# Patient Record
Sex: Female | Born: 1940 | Race: White | Hispanic: No | Smoking: Former smoker
Health system: Southern US, Community
[De-identification: ages and names within clinical notes are randomized; demographics above are authoritative.]

## PROBLEM LIST (undated history)

## (undated) DIAGNOSIS — A0472 Enterocolitis due to Clostridium difficile, not specified as recurrent: Secondary | ICD-10-CM

## (undated) DIAGNOSIS — E871 Hypo-osmolality and hyponatremia: Secondary | ICD-10-CM

## (undated) DIAGNOSIS — R1310 Dysphagia, unspecified: Secondary | ICD-10-CM

## (undated) DIAGNOSIS — E119 Type 2 diabetes mellitus without complications: Secondary | ICD-10-CM

## (undated) DIAGNOSIS — I10 Essential (primary) hypertension: Secondary | ICD-10-CM

## (undated) DIAGNOSIS — R160 Hepatomegaly, not elsewhere classified: Secondary | ICD-10-CM

## (undated) DIAGNOSIS — I509 Heart failure, unspecified: Secondary | ICD-10-CM

## (undated) DIAGNOSIS — D509 Iron deficiency anemia, unspecified: Secondary | ICD-10-CM

## (undated) DIAGNOSIS — G4730 Sleep apnea, unspecified: Secondary | ICD-10-CM

## (undated) DIAGNOSIS — M6281 Muscle weakness (generalized): Secondary | ICD-10-CM

## (undated) DIAGNOSIS — J40 Bronchitis, not specified as acute or chronic: Secondary | ICD-10-CM

## (undated) DIAGNOSIS — Z9181 History of falling: Secondary | ICD-10-CM

## (undated) DIAGNOSIS — N183 Chronic kidney disease, stage 3 unspecified: Secondary | ICD-10-CM

## (undated) DIAGNOSIS — I5030 Unspecified diastolic (congestive) heart failure: Secondary | ICD-10-CM

## (undated) DIAGNOSIS — K2270 Barrett's esophagus without dysplasia: Secondary | ICD-10-CM

## (undated) DIAGNOSIS — E039 Hypothyroidism, unspecified: Secondary | ICD-10-CM

## (undated) DIAGNOSIS — K2970 Gastritis, unspecified, without bleeding: Secondary | ICD-10-CM

## (undated) DIAGNOSIS — J449 Chronic obstructive pulmonary disease, unspecified: Secondary | ICD-10-CM

## (undated) DIAGNOSIS — I480 Paroxysmal atrial fibrillation: Secondary | ICD-10-CM

## (undated) DIAGNOSIS — R262 Difficulty in walking, not elsewhere classified: Secondary | ICD-10-CM

## (undated) DIAGNOSIS — M109 Gout, unspecified: Secondary | ICD-10-CM

## (undated) DIAGNOSIS — K219 Gastro-esophageal reflux disease without esophagitis: Secondary | ICD-10-CM

## (undated) DIAGNOSIS — L0390 Cellulitis, unspecified: Secondary | ICD-10-CM

## (undated) HISTORY — DX: Chronic obstructive pulmonary disease, unspecified: J44.9

## (undated) HISTORY — DX: Barrett's esophagus without dysplasia: K22.70

## (undated) HISTORY — DX: Gastro-esophageal reflux disease without esophagitis: K21.9

## (undated) HISTORY — PX: COLONOSCOPY: SHX174

## (undated) HISTORY — PX: UPPER GASTROINTESTINAL ENDOSCOPY: SHX188

## (undated) HISTORY — DX: Heart failure, unspecified: I50.9

## (undated) HISTORY — DX: Bronchitis, not specified as acute or chronic: J40

## (undated) HISTORY — PX: CHOLECYSTECTOMY: SHX55

---

## 2005-08-02 ENCOUNTER — Ambulatory Visit (HOSPITAL_COMMUNITY): Admission: RE | Admit: 2005-08-02 | Discharge: 2005-08-02 | Payer: Self-pay | Admitting: Family Medicine

## 2005-08-23 ENCOUNTER — Ambulatory Visit: Payer: Self-pay | Admitting: Internal Medicine

## 2005-10-04 ENCOUNTER — Ambulatory Visit: Payer: Self-pay | Admitting: Internal Medicine

## 2005-11-01 ENCOUNTER — Ambulatory Visit: Payer: Self-pay | Admitting: Internal Medicine

## 2005-12-12 ENCOUNTER — Ambulatory Visit (HOSPITAL_COMMUNITY): Admission: RE | Admit: 2005-12-12 | Discharge: 2005-12-12 | Payer: Self-pay | Admitting: General Surgery

## 2008-01-08 DIAGNOSIS — J449 Chronic obstructive pulmonary disease, unspecified: Secondary | ICD-10-CM

## 2008-01-09 ENCOUNTER — Ambulatory Visit: Payer: Self-pay | Admitting: Internal Medicine

## 2008-01-14 ENCOUNTER — Telehealth (INDEPENDENT_AMBULATORY_CARE_PROVIDER_SITE_OTHER): Payer: Self-pay | Admitting: *Deleted

## 2010-10-12 ENCOUNTER — Ambulatory Visit (HOSPITAL_COMMUNITY)
Admission: RE | Admit: 2010-10-12 | Discharge: 2010-10-12 | Payer: Self-pay | Source: Home / Self Care | Attending: Internal Medicine | Admitting: Internal Medicine

## 2010-10-12 ENCOUNTER — Ambulatory Visit: Admit: 2010-10-12 | Payer: Self-pay | Admitting: Internal Medicine

## 2010-10-27 NOTE — Op Note (Signed)
  NAMEMAYTHE, Yolanda [name]                ACCOUNT NO.:  [id number]  MEDICAL RECORD NO.:  [id number]          PATIENT TYPE:  AMB  LOCATION:  DAY                           FACILITY:  [facility]  PHYSICIAN:  Lionel December, M.D.    DATE OF BIRTH:  05/10/1941  DATE OF PROCEDURE:  10/12/2010 DATE OF DISCHARGE:                              OPERATIVE REPORT   PROCEDURE:  Esophagogastroduodenoscopy.  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  Her biopsies revealed Barrett esophagus with inflammation, indeterminate dysplasia.  She is therefore undergoing repeat exam.  She is currently on omeprazole 20 mg daily long with antireflux measures and has not experienced any heartburn or dysphagia.  Procedure risks were reviewed with the patient.  Informed consent was obtained.  MEDICATIONS FOR CONSCIOUS SEDATION:  Cetacaine spray for pharyngeal topical anesthesia, Demerol 50 mg IV, Versed 5 mg IV in divided dose.  FINDINGS:  Procedure performed in endoscopy suite.  The patient's vital signs and O2 sat were monitored during the procedure and remained stable.  The patient was placed in left lateral recumbent position and Pentax videoscope was passed through oropharynx without any difficulty into esophagus.  Esophagus:  Mucosa of the esophagus normal except distally where there was a small patch of salmon-colored mucosa and somewhat larger patch across the mid.  On her last exam, she had small sliding hiatal hernia but this was not obvious today.  GE junction was located at 40 cm from the incisors.  Stomach:  It was emptied and distended very well with insufflation. Folds of proximal stomach are normal.  Examination of mucosa reveals no abnormality at body and antrum.  Somewhat redundant fold in the prepyloric region.  However, no erosions or ulcers were  noted. Angularis, fundus, and cardia were examined by retroflexing the scope and were normal.  Duodenum and bulbar mucosa was normal.  Scope was passed into second part of duodenum where mucosa and folds were normal.  Endoscope was withdrawn.  On the way out biopsies were taken from these 2 patches of salmon- colored mucosa and submitted to 1 container.  The patient tolerated the procedure well. ASSESSMENT: Two patches of salmon-colored mucosa felt to be a short segment Barrett. Multiple biopsies taken. No evidence of peptic ulcer disease.  RECOMMENDATIONS:  She will continue antireflux measures and PPIs as before.  I will be contacting the patient with results of biopsy and further recommendations.     Lionel December, M.D.     NR/MEDQ  D:  10/12/2010  T:  10/12/2010  Job:  [id number]  cc:   Dr. Sherryll Burger  Electronically Signed by Lionel December M.D. on 10/27/2010 01:07:09 PM

## 2011-02-09 NOTE — H&P (Signed)
NAME:  Yolanda [name], Yolanda [name]                ACCOUNT NO.:  [id number]   MEDICAL RECORD NO.:  [id number]          PATIENT TYPE:  AMB   LOCATION:                                FACILITY:  [facility]   PHYSICIAN:  Dalia Heading, M.D.  DATE OF BIRTH:  1940/11/09   DATE OF ADMISSION:  DATE OF DISCHARGE:  LH                                HISTORY & PHYSICAL   CHIEF COMPLAINT:  History of colon polyps.   HISTORY OF PRESENT ILLNESS:  The patient is a 70 year old white female who  is referred for endoscopic evaluation. She needs a colonoscopy for a history  of colon polyps. She had two polyps removed four years ago by another  physician in [place]. No abdominal pain, weight loss, nausea, vomiting,  diarrhea, constipation, or melena have been noted. She has been trace  Hemoccult positive on stool examination. There is no family history of colon  carcinoma.   PAST MEDICAL HISTORY:  1.  Hypothyroidism.  2.  COPD.  3.  Hypertension.   PAST SURGICAL HISTORY:  Cholecystectomy.   CURRENT MEDICATIONS:  Synthroid, Vytorin, Diovan/hydrochlorothiazide,  Advair, albuterol, Niferex.   ALLERGIES:  No known drug allergies.   REVIEW OF SYSTEMS:  Noncontributory.   PHYSICAL EXAMINATION:  GENERAL:  The patient is a well-developed, well-  nourished, white female in no acute distress.  LUNGS:  Clear to auscultation with equal breath sounds bilaterally.  HEART:  Reveals regular rate and rhythm without S3, S4, or murmurs.  ABDOMEN:  Soft, nontender, nondistended. No hepatosplenomegaly or masses are  noted.  RECTAL:  Deferred to the procedure.   IMPRESSION:  Colon polyps.   PLAN:  The patient is scheduled for colonoscopy on December 12, 2005. The risks  and benefits of the procedure including bleeding and perforation were fully  explained to the patient, who gave informed consent.      Dalia Heading, M.D.  Electronically Signed     MAJ/MEDQ  D:  12/04/2005  T:  12/04/2005  Job:  [id number]   cc:   [facility]  Fax: [phone]   Kirk Ruths, M.D.  Fax: [phone]

## 2011-05-26 HISTORY — PX: BREAST BIOPSY: SHX20

## 2011-10-19 ENCOUNTER — Encounter (INDEPENDENT_AMBULATORY_CARE_PROVIDER_SITE_OTHER): Payer: Self-pay | Admitting: *Deleted

## 2011-11-13 ENCOUNTER — Ambulatory Visit (INDEPENDENT_AMBULATORY_CARE_PROVIDER_SITE_OTHER): Payer: MEDICARE | Admitting: Internal Medicine

## 2011-11-13 ENCOUNTER — Encounter (INDEPENDENT_AMBULATORY_CARE_PROVIDER_SITE_OTHER): Payer: Self-pay | Admitting: Internal Medicine

## 2011-11-13 DIAGNOSIS — K2270 Barrett's esophagus without dysplasia: Secondary | ICD-10-CM

## 2011-11-13 DIAGNOSIS — K219 Gastro-esophageal reflux disease without esophagitis: Secondary | ICD-10-CM

## 2011-11-13 MED ORDER — OMEPRAZOLE 20 MG PO CPDR: 20.0000 mg | DELAYED_RELEASE_CAPSULE | Freq: Every day | ORAL | Status: DC

## 2011-11-13 NOTE — Patient Instructions (Signed)
New prescription for omeprazole sent to your pharmacy. Call if symptoms relapse

## 2011-11-13 NOTE — Progress Notes (Signed)
Presenting complaint Chronic GERD. Patient has Barrett's. Subjective: Yolanda [name] is 71 year old C she's been maintained on PPI. She underwent EGD in January 2012 revealing [name] who is here for scheduled visit. She has history of short segment Barrett's esophagus diagnosed in December 2010 when she presented with upper GI bleed and iron deficiency anemia. Biopsy was indeterminate for dysplasia. She was maintained on PPI. She underwent EGD with biopsy in January 2012 revealing Barrett's without dysplasia. She states she is doing well she denies heartburn dysphagia nausea vomiting or throat symptoms. She has a lot of energy. Bowels move regularly she denies melena or rectal bleeding. Her appetite is very good and she has gained 5 pounds since her last visit. Current Medications: Current Outpatient Prescriptions  Medication Sig Dispense Refill  . acetaminophen (TYLENOL) 500 MG tablet Take 500 mg by mouth as needed.      Marland Kitchen aspirin 81 MG tablet Take 81 mg by mouth as needed.      . cyclobenzaprine (FLEXERIL) 10 MG tablet Take 10 mg by mouth as needed.      . docusate sodium (COLACE) 100 MG capsule Take 100 mg by mouth as needed.      . fexofenadine (ALLEGRA) 180 MG tablet Take 180 mg by mouth daily.      . fluticasone (FLONASE) 50 MCG/ACT nasal spray Place 2 sprays into the nose as needed.      . Fluticasone-Salmeterol (ADVAIR) 250-50 MCG/DOSE AEPB Inhale 1 puff into the lungs every 12 (twelve) hours.      Marland Kitchen HYDROCHLOROTHIAZIDE PO Take 25 mg by mouth daily. Patient takes 1/2 tablet daily      . ibuprofen (ADVIL,MOTRIN) 200 MG tablet Take 200 mg by mouth as needed.      Marland Kitchen levothyroxine (SYNTHROID, LEVOTHROID) 100 MCG tablet Take 100 mcg by mouth daily.      Marland Kitchen lisinopril (PRINIVIL,ZESTRIL) 20 MG tablet Take 20 mg by mouth daily.      Marland Kitchen lovastatin (MEVACOR) 10 MG tablet Take 10 mg by mouth at bedtime.      . meclizine (ANTIVERT) 25 MG tablet Take 25 mg by mouth as needed.      Marland Kitchen omeprazole  (PRILOSEC) 20 MG capsule Take 20 mg by mouth daily.        Objective: Blood pressure 126/70, pulse 74, temperature 98.5 F (36.9 C), temperature source Oral, resp. rate 14, height 5\' 1"  (1.549 m), weight 215 lb 1.6 oz (97.569 kg).  Conjunctiva is pink. Sclera is nonicteric Oropharyngeal mucosa is normal. No neck masses or thyromegaly noted. Abdomen is full but soft and nontender without organomegaly or masses. No LE edema or clubbing noted.  Assessment: Chronic GERD complicated by short segment Barrett's esophagus. Biopsy from January 2012 is negative for dysplasia. Her symptoms are well-controlled with therapy.   Plan: She'll continue anti-reflux measures and omeprazole as before. New prescription sent to her pharmacy. Office visit in one year.

## 2012-11-17 ENCOUNTER — Encounter (INDEPENDENT_AMBULATORY_CARE_PROVIDER_SITE_OTHER): Payer: Self-pay | Admitting: *Deleted

## 2012-11-25 ENCOUNTER — Other Ambulatory Visit (INDEPENDENT_AMBULATORY_CARE_PROVIDER_SITE_OTHER): Payer: Self-pay | Admitting: Internal Medicine

## 2012-12-25 ENCOUNTER — Other Ambulatory Visit: Payer: Self-pay | Admitting: Family Medicine

## 2012-12-26 NOTE — Telephone Encounter (Signed)
This pt. Has not been since 10/05/11

## 2013-01-12 ENCOUNTER — Ambulatory Visit (INDEPENDENT_AMBULATORY_CARE_PROVIDER_SITE_OTHER): Payer: MEDICARE | Admitting: Internal Medicine

## 2013-01-15 ENCOUNTER — Ambulatory Visit (INDEPENDENT_AMBULATORY_CARE_PROVIDER_SITE_OTHER): Payer: MEDICARE | Admitting: Internal Medicine

## 2013-01-15 ENCOUNTER — Ambulatory Visit (INDEPENDENT_AMBULATORY_CARE_PROVIDER_SITE_OTHER): Payer: Medicare Other | Admitting: Internal Medicine

## 2013-01-15 ENCOUNTER — Encounter (INDEPENDENT_AMBULATORY_CARE_PROVIDER_SITE_OTHER): Payer: Self-pay | Admitting: Internal Medicine

## 2013-01-15 VITALS — BP 184/62 | HR 88 | Ht 61.5 in | Wt 221.8 lb

## 2013-01-15 DIAGNOSIS — K2270 Barrett's esophagus without dysplasia: Secondary | ICD-10-CM

## 2013-01-15 NOTE — Progress Notes (Addendum)
Subjective:     Patient ID: Yolanda [name], female   DOB: March 22, 1941, 72 y.o.   MRN: [id number]  HPI Here today for f/u of her Barrett's esophagus. She tells me she is doing great. There is no acid reflux. Controlled with Omeprazole. Appetite is good. No weight loss. No abdominal pain. BMs are normal. She does take a stool softner for her stools.   10/12/10 EGD: INDICATIONS: Yolanda [name] is a 72 year old Caucasian female who was found to  have short segment Barrett esophagus in December 2010 when she presented  with GI bleed and iron deficiency anemia. Her biopsies revealed Barrett  esophagus with inflammation, indeterminate dysplasia. ASSESSMENT:  Two patches of salmon-colored mucosa felt to be a short segment Barrett.  Multiple biopsies taken.  No evidence of peptic ulcer disease.  Biopsy:  1. Esophagus, biopsy, : - INTESTINAL METAPLASIA WITH GOBLET CELLS (BARRETT'S ESOPHAGUS) - NEGATIVE FOR DYSPLASIA - SQUAMOCOLUMNAR MUCOSA WITH SQUAMOUS EPITHELIAL CHANGES CONSISTENT WITH REFLUX RELATED INJURY - NEGATIVE FOR MORPHOLOGICAL FEATURES OF EOSINOPHILIC ESOPHAGITIS - REACTIVE AND INFLAMED COLUMNAR/GLANDULAR MUCOSA. Yolanda [name] STAIN NEGATIVE FOR H. PYLORI    Review of Systems se hpi Current Outpatient Prescriptions  Medication Sig Dispense Refill  . acetaminophen (TYLENOL) 500 MG tablet Take 500 mg by mouth as needed.      . cyclobenzaprine (FLEXERIL) 10 MG tablet Take 10 mg by mouth as needed.      . docusate sodium (COLACE) 100 MG capsule Take 100 mg by mouth as needed.      . ergocalciferol (VITAMIN D2) 50000 UNITS capsule Take 50,000 Units by mouth. Twice a week      . ezetimibe-simvastatin (VYTORIN) 10-10 MG per tablet Take 1 tablet by mouth at bedtime.      . fexofenadine (ALLEGRA) 180 MG tablet Take 180 mg by mouth daily.      . fluticasone (FLONASE) 50 MCG/ACT nasal spray Place 2 sprays into the nose as needed.      Marland Kitchen ibuprofen (ADVIL,MOTRIN) 200 MG tablet Take 200 mg by mouth  as needed.      Marland Kitchen levothyroxine (SYNTHROID, LEVOTHROID) 100 MCG tablet Take 100 mcg by mouth daily.      Marland Kitchen lisinopril (PRINIVIL,ZESTRIL) 20 MG tablet Take 20 mg by mouth daily.      Marland Kitchen omeprazole (PRILOSEC) 20 MG capsule TAKE ONE CAPSULE ONCE DAILY  30 capsule  5  . topiramate (TOPAMAX) 25 MG capsule Take 25 mg by mouth 2 (two) times daily.       No current facility-administered medications for this visit.   Past Medical History  Diagnosis Date  . Thyroid condition   . Hypertension   . GERD (gastroesophageal reflux disease)   . COPD (chronic obstructive pulmonary disease)   . Bronchitis   . Barrett esophagus    Past Surgical History  Procedure Laterality Date  . Cholecystectomy    . Colonoscopy    . Upper gastrointestinal endoscopy    . Right breast biopsy  Sept, 2012   Allergies  Allergen Reactions  . Fish Oil     Patient face drew to the side,Bells Palsey  . Ciprofloxacin     REACTION: sob,tachycardia  . Guaifenesin     REACTION: sob,tachycardia         Objective:   Physical Exam  Filed Vitals:   01/15/13 1507  BP: 184/62  Pulse: 88  Height: 5' 1.5" (1.562 m)  Weight: 221 lb 12.8 oz (100.608 kg)  Alert and oriented. Skin warm and dry. Oral mucosa  is moist.   . Sclera anicteric, conjunctivae is pink. Thyroid not enlarged. No cervical lymphadenopathy. Lungs clear. Heart regular rate and rhythm.  Abdomen is soft. Bowel sounds are positive. No hepatomegaly. No abdominal masses felt. No tenderness.  No edema to lower extremities.        Assessment:     Barrett's esophagus. Acid reflux controlled at this time.     Plan:    OV in 1 yr.Next EGD in 3 yrs.

## 2013-01-15 NOTE — Patient Instructions (Addendum)
OV in 1 yr. 

## 2013-01-29 ENCOUNTER — Encounter (INDEPENDENT_AMBULATORY_CARE_PROVIDER_SITE_OTHER): Payer: Self-pay

## 2013-05-27 ENCOUNTER — Other Ambulatory Visit (INDEPENDENT_AMBULATORY_CARE_PROVIDER_SITE_OTHER): Payer: Self-pay | Admitting: Internal Medicine

## 2013-07-23 ENCOUNTER — Ambulatory Visit (INDEPENDENT_AMBULATORY_CARE_PROVIDER_SITE_OTHER): Payer: Medicare Other | Admitting: Internal Medicine

## 2013-07-23 ENCOUNTER — Encounter: Payer: Self-pay | Admitting: Internal Medicine

## 2013-07-23 VITALS — BP 150/74 | HR 99 | Temp 98.3°F | Ht 61.0 in | Wt 231.8 lb

## 2013-07-23 DIAGNOSIS — Z23 Encounter for immunization: Secondary | ICD-10-CM

## 2013-07-23 DIAGNOSIS — J449 Chronic obstructive pulmonary disease, unspecified: Secondary | ICD-10-CM

## 2013-07-23 DIAGNOSIS — J4489 Other specified chronic obstructive pulmonary disease: Secondary | ICD-10-CM

## 2013-07-23 MED ORDER — UMECLIDINIUM-VILANTEROL 62.5-25 MCG/INH IN AEPB: 2.0000 | INHALATION_SPRAY | Freq: Once | RESPIRATORY_TRACT | Status: DC

## 2013-07-23 NOTE — Progress Notes (Signed)
  Subjective:    Patient ID: Yolanda [name], female    DOB: [birth year] MRN: [id number]  HPI  52 yowf quit smoking 2001 at around 170 lb with pattern of recurrent bronchitis then that improved some and much better  p advair and ever better p stopped working at  [employer] 2011 then started gradual doe 2012 around wt 190 self referred to Pulmonary clinic 07/23/2013 for doe at wt 231   07/23/2013 1st [facility] office visit in EMR era/ Krisy Dix cc 2 y hx indolent onset doe progressive worse doe hills, fast pace on flat surface but back also limiting. Better walking p albuterol (proaire)   No change on symbicort previously, no tendency to aecopd. Doe parallels gradual wt gain. avg use of albuterol maybe once qod while maint on advair 250 bid with mild hoarseness noted. No better on breo   No obvious day to day or daytime variabilty or assoc chronic cough or cp or chest tightness, subjective wheeze overt sinus or hb symptoms. No unusual exp hx or h/o childhood pna/ asthma or knowledge of premature birth.  Sleeping ok without nocturnal  or early am exacerbation  of respiratory  c/o's or need for noct saba. Also denies any obvious fluctuation of symptoms with weather or environmental changes or other aggravating or alleviating factors except as outlined above - Breo no better than advair  Current Medications, Allergies, Complete Past Medical History, Past Surgical History, Family History, and Social History were reviewed in [employer] record.          Review of Systems  Constitutional: Negative for fever, chills and unexpected weight change.  HENT: Positive for voice change. Negative for congestion, dental problem, ear pain, nosebleeds, postnasal drip, rhinorrhea, sinus pressure, sneezing, sore throat and trouble swallowing.   Eyes: Negative for visual disturbance.  Respiratory: Positive for shortness of breath. Negative for cough and choking.   Cardiovascular: Positive for leg  swelling. Negative for chest pain.  Gastrointestinal: Negative for vomiting, abdominal pain and diarrhea.  Genitourinary: Negative for difficulty urinating.  Musculoskeletal: Positive for arthralgias.  Skin: Negative for rash.  Neurological: Negative for tremors, syncope and headaches.  Hematological: Bruises/bleeds easily.       Objective:   Physical Exam  Wt Readings from Last 3 Encounters:  07/23/13 231 lb 12.8 oz (105.144 kg)  01/15/13 221 lb 12.8 oz (100.608 kg)  11/13/11 215 lb 1.6 oz (97.569 kg)     Pleasant amb wf nad  HEENT: nl dentition, turbinates, and orophanx. Nl external ear canals without cough reflex   NECK :  without JVD/Nodes/TM/ nl carotid upstrokes bilaterally   LUNGS: no acc muscle use, clear to A and P bilaterally without cough on insp or exp maneuvers   CV:  RRR  no s3 or murmur or increase in P2, no edema   ABD:  soft and nontender with nl excursion in the supine position. No bruits or organomegaly, bowel sounds nl  MS:  warm without deformities, calf tenderness, cyanosis or clubbing  SKIN: warm and dry without lesions    NEURO:  alert, approp, no deficits    02/10/13 cxr copd mild hyperinflation      Assessment & Plan:

## 2013-07-23 NOTE — Patient Instructions (Addendum)
anoro take two drags each am after opening only once  Please schedule a follow up office visit in 6 weeks, call sooner if needed with pft's on return.  Add needs walking sats also

## 2013-07-24 NOTE — Assessment & Plan Note (Signed)
  When respiratory symptoms begin or become refractory well after a patient reports complete smoking cessation,  Especially when this wasn't the case while they were smoking, a red flag is raised based on the work of Dr Primitivo Gauze which states:  if you quit smoking when your best day FEV1 is still well preserved it is highly unlikely you will progress to severe disease.  That is to say, once the smoking stops,  the symptoms should not suddenly erupt or markedly worsen.  If so, the differential diagnosis should include  obesity/deconditioning,  LPR/Reflux/Aspiration syndromes,  occult CHF, or  especially side effect of medications commonly used in this population.    Obesity / deconditioning appear to be the main issue here. Try anoro samples and return for pft's to compare to baseline from paper chart/ requested  The proper method of use, as well as anticipated side effects, of a metered-dose dry powdered inhaler are discussed and demonstrated to the patient. Improved effectiveness after extensive coaching during this visit to a level of approximately  90%

## 2013-09-01 ENCOUNTER — Other Ambulatory Visit: Payer: Self-pay | Admitting: Internal Medicine

## 2013-09-01 DIAGNOSIS — J449 Chronic obstructive pulmonary disease, unspecified: Secondary | ICD-10-CM

## 2013-09-03 ENCOUNTER — Ambulatory Visit (INDEPENDENT_AMBULATORY_CARE_PROVIDER_SITE_OTHER): Payer: Medicare Other | Admitting: Internal Medicine

## 2013-09-03 ENCOUNTER — Encounter: Payer: Self-pay | Admitting: Internal Medicine

## 2013-09-03 VITALS — BP 144/62 | HR 95 | Temp 98.1°F | Ht 61.0 in | Wt 232.0 lb

## 2013-09-03 DIAGNOSIS — J449 Chronic obstructive pulmonary disease, unspecified: Secondary | ICD-10-CM

## 2013-09-03 DIAGNOSIS — R05 Cough: Secondary | ICD-10-CM

## 2013-09-03 DIAGNOSIS — R058 Other specified cough: Secondary | ICD-10-CM | POA: Insufficient documentation

## 2013-09-03 LAB — PULMONARY FUNCTION TEST
DL/VA % pred: 87 %
DLCO unc: 13.79 ml/min/mmHg
FEF 25-75 Post: 0.87 L/sec
FEF 25-75 Pre: 0.6 L/sec
FEF2575-%Change-Post: 45 %
FEF2575-%Pred-Post: 51 %
FEF2575-%Pred-Pre: 35 %
FEV1-%Change-Post: 9 %
FEV1-%Pred-Post: 53 %
FEV1-%Pred-Pre: 48 %
FEV1-Post: 1.05 L
FEV1-Pre: 0.95 L
FEV1FVC-%Change-Post: 7 %
FEV1FVC-%Pred-Pre: 92 %
FEV6-%Change-Post: 2 %
FEV6-%Pred-Post: 55 %
FEV6-Post: 1.39 L
FEV6FVC-%Pred-Post: 105 %
FEV6FVC-%Pred-Pre: 105 %
FVC-%Pred-Post: 52 %
FVC-%Pred-Pre: 51 %
FVC-Post: 1.39 L
FVC-Pre: 1.36 L
Post FEV1/FVC ratio: 76 %
Post FEV6/FVC ratio: 100 %
Pre FEV1/FVC ratio: 70 %
RV % pred: 96 %
RV: 1.97 L
TLC % pred: 79 %
TLC: 3.66 L

## 2013-09-03 MED ORDER — FAMOTIDINE 20 MG PO TABS: ORAL_TABLET | ORAL | Status: DC

## 2013-09-03 MED ORDER — UMECLIDINIUM-VILANTEROL 62.5-25 MCG/INH IN AEPB: 2.0000 | INHALATION_SPRAY | Freq: Once | RESPIRATORY_TRACT | Status: DC

## 2013-09-03 NOTE — Progress Notes (Signed)
Subjective:    Patient ID: Yolanda [name], female    DOB: 1940-11-27 MRN: [id number]       Brief patient profile:  72 yowf quit smoking 2001 at around 170 lb with pattern of recurrent bronchitis then that improved some and much better  p advair and ever better p stopped working at  [employer] 2011 then started gradual doe 2012 around wt 190 self referred to Pulmonary clinic 07/23/2013 for doe at wt 231 with techically a GOLD II pattern copd documented 09/03/2013    History of Present Illness  07/23/2013 1st  Pulmonary office visit in EMR era/ Yolanda [name] cc 2 y hx indolent onset doe progressive worse doe hills, fast pace on flat surface but back also limiting. Better walking p albuterol (proaire)   No change on symbicort previously, no tendency to aecopd. Doe parallels gradual wt gain. avg use of albuterol maybe once qod while maint on advair 250 bid with mild hoarseness noted. No better on breo rec anoro take two drags each am after opening only once   09/03/2013 f/u ov/Yolanda [name] re: doe/ GOLD II copd   Chief Complaint  Patient presents with  . Follow-up    Pt states her SOB has improved some. She has noticed "rattle" in chest since started taking anoro.    Legs give out before the breathing.  Since anoro not needing much if any albuterol  Throat "congestion" worse in am, no excess or purulent sputum and does not awaken prematurely  No obvious day to day or daytime variabilty or assoc   cp or chest tightness, subjective wheeze overt sinus or hb symptoms. No unusual exp hx or h/o childhood pna/ asthma or knowledge of premature birth.  Sleeping ok without nocturnal  or early am exacerbation  of respiratory  c/o's or need for noct saba. Also denies any obvious fluctuation of symptoms with weather or environmental changes or other aggravating or alleviating factors except as outlined above   Current Medications, Allergies, Complete Past Medical History, Past Surgical History, Family History, and  Social History were reviewed in [employer] record.  ROS  The following are not active complaints unless bolded sore throat, dysphagia, dental problems, itching, sneezing,  nasal congestion or excess/ purulent secretions, ear ache,   fever, chills, sweats, unintended wt loss, pleuritic or exertional cp, hemoptysis,  orthopnea pnd or leg swelling, presyncope, palpitations, heartburn, abdominal pain, anorexia, nausea, vomiting, diarrhea  or change in bowel or urinary habits, change in stools or urine, dysuria,hematuria,  rash, arthralgias, visual complaints, headache, numbness weakness or ataxia or problems with walking or coordination,  change in mood/affect or memory.                       Objective:   Physical Exam  09/03/2013      232  Wt Readings from Last 3 Encounters:  07/23/13 231 lb 12.8 oz (105.144 kg)  01/15/13 221 lb 12.8 oz (100.608 kg)  11/13/11 215 lb 1.6 oz (97.569 kg)     Pleasant amb wf nad  HEENT: nl dentition, turbinates, and orophanx. Nl external ear canals without cough reflex   NECK :  without JVD/Nodes/TM/ nl carotid upstrokes bilaterally   LUNGS: no acc muscle use, clear to A and P bilaterally without cough on insp or exp maneuvers   CV:  RRR  no s3 or murmur or increase in P2, no edema   ABD:  soft and nontender with nl excursion in the supine  position. No bruits or organomegaly, bowel sounds nl  MS:  warm without deformities, calf tenderness, cyanosis or clubbing  SKIN: warm and dry without lesions    NEURO:  alert, approp, no deficits    02/10/13 cxr copd mild hyperinflation      Assessment & Plan:

## 2013-09-03 NOTE — Assessment & Plan Note (Signed)
-   trial of anoro 07/24/2013 > improved 09/03/2013  - 09/03/2013  Walked RA x 2laps @ 185 ft each stopped due to legs gave out, sats still 93% - PFT's 09/03/2013 FEV1  1.05 (53%) p B2 and while on Anoro with ratio FEV1/VC 62% and dlco 68 corrects to 87  Improved on anoro vs advair or symbicort and much less saba use now   rec continue anoro unless starting to have aecopd or upper airway cough worse in which case would need to see her back her to regroup

## 2013-09-03 NOTE — Progress Notes (Signed)
PFT done today. 

## 2013-09-03 NOTE — Assessment & Plan Note (Signed)
Throat congestion with absence of excess mucus worse in am and worse on dpi c/w  Classic Upper airway cough syndrome, so named because it's frequently impossible to sort out how much is  CR/sinusitis with freq throat clearing (which can be related to primary GERD)   vs  causing  secondary (" extra esophageal")  GERD from wide swings in gastric pressure that occur with throat clearing, often  promoting self use of mint and menthol lozenges that reduce the lower esophageal sphincter tone and exacerbate the problem further in a cyclical fashion.   These are the same pts (now being labeled as having "irritable larynx syndrome" by some cough centers) who not infrequently have a history of having failed to tolerate ace inhibitors,  dry powder inhalers or biphosphonates or report having atypical reflux symptoms that don't respond to standard doses of PPI , and are easily confused as having aecopd or asthma flares by even experienced allergists/ pulmonologists.  rec add pepcid 20 mg at bedtime and if not effective add 1st gen h1 also If not tolerating laba/lamb dpi there's a new laba/lama hfa coming out soon from [facility] she could try.  F/u is prn

## 2013-09-03 NOTE — Patient Instructions (Addendum)
Omeprazole Take 30-60 min before first meal of the day and pepcid 20 mg at bedtime to see if your am throat congestion improves  GERD (REFLUX)  is an extremely common cause of respiratory symptoms, many times with no significant heartburn at all.    It can be treated with medication, but also with lifestyle changes including avoidance of late meals, excessive alcohol, smoking cessation, and avoid fatty foods, chocolate, peppermint, colas, red wine, and acidic juices such as orange juice.  NO MINT OR MENTHOL PRODUCTS SO NO COUGH DROPS  USE SUGARLESS CANDY INSTEAD (jolley ranchers or Stover's)  NO OIL BASED VITAMINS - use powdered substitutes.    Continue off lisinopril (ace inhibitors tend to do poorly with powdered inhalers)  Continue anoro each am and brush teeth, tongue gargle with baking soda containing toothpast   Only use your albuterol as a rescue medication to be used if you can't catch your breath by resting or doing a relaxed purse lip breathing pattern.  - The less you use it, the better it will work when you need it. - Ok to use up to 2puffs every 4 hours if you must but call for immediate appointment if use goes up over your usual need - Don't leave home without it !!  (think of it like your spare tire for your car)

## 2013-10-07 ENCOUNTER — Other Ambulatory Visit (HOSPITAL_COMMUNITY): Payer: Self-pay | Admitting: Pulmonary Disease

## 2013-10-07 DIAGNOSIS — M7989 Other specified soft tissue disorders: Secondary | ICD-10-CM

## 2013-10-09 ENCOUNTER — Ambulatory Visit (HOSPITAL_COMMUNITY)
Admission: RE | Admit: 2013-10-09 | Discharge: 2013-10-09 | Disposition: A | Discharge status: Home / Self Care | Payer: Medicare Other | Source: Ambulatory Visit | Attending: Pulmonary Disease | Admitting: Pulmonary Disease

## 2013-10-09 DIAGNOSIS — M7989 Other specified soft tissue disorders: Secondary | ICD-10-CM | POA: Insufficient documentation

## 2013-10-20 ENCOUNTER — Encounter (INDEPENDENT_AMBULATORY_CARE_PROVIDER_SITE_OTHER): Payer: Self-pay | Admitting: *Deleted

## 2013-12-05 ENCOUNTER — Other Ambulatory Visit (INDEPENDENT_AMBULATORY_CARE_PROVIDER_SITE_OTHER): Payer: Self-pay | Admitting: Internal Medicine

## 2013-12-08 ENCOUNTER — Other Ambulatory Visit (INDEPENDENT_AMBULATORY_CARE_PROVIDER_SITE_OTHER): Payer: Self-pay | Admitting: Internal Medicine

## 2013-12-08 DIAGNOSIS — K219 Gastro-esophageal reflux disease without esophagitis: Secondary | ICD-10-CM

## 2013-12-08 MED ORDER — OMEPRAZOLE 40 MG PO CPDR: 40.0000 mg | DELAYED_RELEASE_CAPSULE | Freq: Every day | ORAL | Status: DC

## 2013-12-21 ENCOUNTER — Other Ambulatory Visit (INDEPENDENT_AMBULATORY_CARE_PROVIDER_SITE_OTHER): Payer: Self-pay | Admitting: Internal Medicine

## 2013-12-21 DIAGNOSIS — K219 Gastro-esophageal reflux disease without esophagitis: Secondary | ICD-10-CM

## 2013-12-21 MED ORDER — OMEPRAZOLE 20 MG PO CPDR: 20.0000 mg | DELAYED_RELEASE_CAPSULE | Freq: Every day | ORAL | Status: DC

## 2013-12-30 ENCOUNTER — Ambulatory Visit (HOSPITAL_COMMUNITY)
Admission: RE | Admit: 2013-12-30 | Discharge: 2013-12-30 | Disposition: A | Discharge status: Home / Self Care | Payer: Medicare Other | Source: Ambulatory Visit | Attending: Pulmonary Disease | Admitting: Pulmonary Disease

## 2013-12-30 DIAGNOSIS — Z87891 Personal history of nicotine dependence: Secondary | ICD-10-CM | POA: Insufficient documentation

## 2013-12-30 DIAGNOSIS — R609 Edema, unspecified: Secondary | ICD-10-CM | POA: Insufficient documentation

## 2013-12-30 DIAGNOSIS — I319 Disease of pericardium, unspecified: Secondary | ICD-10-CM | POA: Insufficient documentation

## 2013-12-30 DIAGNOSIS — J4489 Other specified chronic obstructive pulmonary disease: Secondary | ICD-10-CM | POA: Insufficient documentation

## 2013-12-30 DIAGNOSIS — I10 Essential (primary) hypertension: Secondary | ICD-10-CM | POA: Insufficient documentation

## 2013-12-30 DIAGNOSIS — J449 Chronic obstructive pulmonary disease, unspecified: Secondary | ICD-10-CM | POA: Insufficient documentation

## 2013-12-30 DIAGNOSIS — Z6841 Body Mass Index (BMI) 40.0 and over, adult: Secondary | ICD-10-CM | POA: Insufficient documentation

## 2013-12-30 DIAGNOSIS — I517 Cardiomegaly: Secondary | ICD-10-CM

## 2013-12-30 NOTE — Progress Notes (Signed)
*  PRELIMINARY RESULTS* Echocardiogram 2D Echocardiogram has been performed.  Renae FickleCynthia L Haniyyah Sakuma 12/30/2013, 2:51 PM

## 2014-01-12 ENCOUNTER — Encounter: Payer: Self-pay | Admitting: Cardiovascular Disease

## 2014-01-12 ENCOUNTER — Ambulatory Visit (INDEPENDENT_AMBULATORY_CARE_PROVIDER_SITE_OTHER): Payer: Medicare Other | Admitting: Cardiovascular Disease

## 2014-01-12 VITALS — BP 137/79 | HR 85 | Ht 61.5 in | Wt 220.0 lb

## 2014-01-12 DIAGNOSIS — Z136 Encounter for screening for cardiovascular disorders: Secondary | ICD-10-CM

## 2014-01-12 DIAGNOSIS — R609 Edema, unspecified: Secondary | ICD-10-CM

## 2014-01-12 DIAGNOSIS — R600 Localized edema: Secondary | ICD-10-CM | POA: Insufficient documentation

## 2014-01-12 DIAGNOSIS — Z79899 Other long term (current) drug therapy: Secondary | ICD-10-CM

## 2014-01-12 DIAGNOSIS — I10 Essential (primary) hypertension: Secondary | ICD-10-CM

## 2014-01-12 MED ORDER — FUROSEMIDE 20 MG PO TABS: 20.0000 mg | ORAL_TABLET | Freq: Every day | ORAL | Status: DC

## 2014-01-12 NOTE — Progress Notes (Signed)
Patient ID: Yolanda [name], female   DOB: 1940/12/17, 73 y.o.   MRN: [id number]       CARDIOLOGY CONSULT NOTE  Patient ID: Yolanda [name] MRN: [id number] DOB/AGE: 1940/12/17 73 y.o.  Admit date: (Not on file) Primary Physician HAWKINS,EDWARD L, MD  Reason for Consultation: leg edema  HPI: The patient is a 73 year old woman with a history of COPD, hypertension, GERD, and hyperlipidemia who has been experiencing leg and feet swelling. She said her bilateral leg and feet swelling has been going on since December. She has chronic dyspnea due to COPD which has not gotten any worse. She denies orthopnea and paroxysmal nocturnal dyspnea. She denies chest pain, palpitations, lightheadedness, dizziness and syncope. It appears she may have been on hydrochlorothiazide in the past. She tells me that she had also been on lisinopril but this caused her potassium to be elevated. ECG today shows normal sinus rhythm with a right bundle branch block and a nonspecific T wave abnormality. Recent echocardiogram which I personally interpreted showed normal left ventricular systolic function, EF 60-65%, mild LVH, grade 1 diastolic dysfunction, elevated filling pressures, and a trivial pericardial effusion. Lower extremity ultrasonography in January 2015 showed no evidence of DVT.    Allergies  Allergen Reactions  . Fish Oil     Patient face drew to the side,Bells Palsey  . Ciprofloxacin     REACTION: sob,tachycardia  . Guaifenesin     REACTION: sob,tachycardia  . Tramadol     insomnia    Current Outpatient Prescriptions  Medication Sig Dispense Refill  . acetaminophen (TYLENOL) 500 MG tablet Take 500 mg by mouth as needed.      . cetirizine (ZYRTEC) 10 MG tablet Take 10 mg by mouth daily.      . famotidine (PEPCID) 20 MG tablet One at bedtime  30 tablet  2  . fluticasone (FLONASE) 50 MCG/ACT nasal spray Place 2 sprays into the nose as needed.      Marland Kitchen. ibuprofen (ADVIL,MOTRIN) 800 MG tablet Take 800  mg by mouth every 8 (eight) hours as needed for pain.      Marland Kitchen. levothyroxine (SYNTHROID, LEVOTHROID) 137 MCG tablet Take 137 mcg by mouth daily before breakfast.      . losartan (COZAAR) 100 MG tablet Take 100 mg by mouth daily.      Marland Kitchen. omeprazole (PRILOSEC) 20 MG capsule Take 1 capsule (20 mg total) by mouth daily.  90 capsule  3  . rOPINIRole (REQUIP) 0.5 MG tablet Take 0.5 mg by mouth at bedtime as needed.      Marland Kitchen. Umeclidinium-Vilanterol 62.5-25 MCG/INH AEPB Inhale 2 puffs into the lungs daily. Only open the device one time and then take your two separate drags to be sure you get it all       No current facility-administered medications for this visit.    Past Medical History  Diagnosis Date  . Thyroid condition   . Hypertension   . GERD (gastroesophageal reflux disease)   . COPD (chronic obstructive pulmonary disease)   . Bronchitis   . Barrett esophagus     Past Surgical History  Procedure Laterality Date  . Cholecystectomy    . Colonoscopy    . Upper gastrointestinal endoscopy    . Right breast biopsy  Sept, 2012    History   Social History  . Marital Status: Widowed    Spouse Name: N/A    Number of Children: N/A  . Years of Education: N/A   Occupational History  .  Not on file.   Social History Main Topics  . Smoking status: Former Smoker -- 0.50 packs/day for 30 years    Types: Cigarettes    Quit date: 11/13/1999  . Smokeless tobacco: Never Used  . Alcohol Use: No  . Drug Use: No  . Sexual Activity: Not on file   Other Topics Concern  . Not on file   Social History Narrative  . No narrative on file     No family history of premature CAD in 1st degree relatives.  Prior to Admission medications   Medication Sig Start Date End Date Taking? Authorizing Provider  acetaminophen (TYLENOL) 500 MG tablet Take 500 mg by mouth as needed.   Yes Historical Provider, MD  cetirizine (ZYRTEC) 10 MG tablet Take 10 mg by mouth daily.   Yes Historical Provider, MD    famotidine (PEPCID) 20 MG tablet One at bedtime 09/03/13  Yes Nyoka CowdenMichael B Wert, MD  fluticasone [facility](FLONASE) 50 MCG/ACT nasal spray Place 2 sprays into the nose as needed.   Yes Historical Provider, MD  ibuprofen (ADVIL,MOTRIN) 800 MG tablet Take 800 mg by mouth every 8 (eight) hours as needed for pain.   Yes Historical Provider, MD  levothyroxine (SYNTHROID, LEVOTHROID) 137 MCG tablet Take 137 mcg by mouth daily before breakfast.   Yes Historical Provider, MD  losartan (COZAAR) 100 MG tablet Take 100 mg by mouth daily. 12/23/13  Yes Historical Provider, MD  omeprazole (PRILOSEC) 20 MG capsule Take 1 capsule (20 mg total) by mouth daily. 12/21/13  Yes Len Blalockerri L Setzer, NP  rOPINIRole (REQUIP) 0.5 MG tablet Take 0.5 mg by mouth at bedtime as needed. 12/18/13  Yes Historical Provider, MD  Umeclidinium-Vilanterol 62.5-25 MCG/INH AEPB Inhale 2 puffs into the lungs daily. Only open the device one time and then take your two separate drags to be sure you get it all 09/03/13  Yes Nyoka CowdenMichael B Wert, MD     Review of systems complete and found to be negative unless listed above in HPI     Physical exam Blood pressure 137/79, pulse 85, height 5' 1.5" (1.562 m), weight 220 lb (99.791 kg), SpO2 94.00%. General: NAD Neck: No JVD, no thyromegaly or thyroid nodule.  Lungs: Clear to auscultation bilaterally with normal respiratory effort. CV: Nondisplaced PMI.  Heart regular S1/S2, no S3/S4, no murmur. 1+ pretibial and periankle pitting edema.  No carotid bruit.  Normal pedal pulses.  Abdomen: Soft, nontender, no hepatosplenomegaly, no distention.  Skin: Intact without lesions or rashes.  Neurologic: Alert and oriented x 3.  Psych: Normal affect. Extremities: No clubbing or cyanosis.  HEENT: Normal.   Labs:   No results found for this basename: WBC, HGB, HCT, MCV, PLT   No results found for this basename: NA, K, CL, CO2, BUN, CREATININE, CALCIUM, LABALBU, PROT, BILITOT, ALKPHOS, ALT, AST, GLUCOSE,  in the last  168 hours No results found for this basename: CKTOTAL, CKMB, CKMBINDEX, TROPONINI    No results found for this basename: CHOL   No results found for this basename: HDL   No results found for this basename: LDLCALC   No results found for this basename: TRIG   No results found for this basename: CHOLHDL   No results found for this basename: LDLDIRECT        Studies: See HPI  ASSESSMENT AND PLAN:  1. Pedal edema: This is likely due to mild hypertensive heart disease and essential hypertension. I will start Lasix 20 mg daily and check a basic metabolic  panel along with a serum magnesium level in one week, to make certain she is not hypokalemic or hypomagnesemic. I will also monitor her renal function. Today her blood pressure is normal. 2. HTN: Currently controlled. I've asked her to bring in her blood pressure cuff to the office this week to make sure it is appropriately calibrated. I will have her check her blood pressure 3-4 times a week over the next few weeks to see if further antihypertensive titration is warranted. If need be, I would consider the addition of amlodipine.  Dispo: f/u 1 month.  Signed: Prentice Docker, M.D., F.A.C.C.  01/12/2014, 1:49 PM

## 2014-01-12 NOTE — Patient Instructions (Addendum)
   Begin Lasix 20mg  daily - new sent to pharm Continue all other medications.   Lab for BMET, Magnesium - due in 1 week, around 01/19/2014 Office will contact with results via phone or letter.   Patient to bring wrist cuff to office for calibration  Your physician has requested that you regularly monitor and record your blood pressure readings at home. Please check at varied times of the day 3-4 times per week over the next 2 weeks & return to office for MD review. Follow up in  1 month

## 2014-01-14 ENCOUNTER — Encounter: Payer: Self-pay | Admitting: *Deleted

## 2014-01-14 NOTE — Progress Notes (Signed)
Patient ID: [name], female   DOB: August 31, 1941, 73 y.o.   MRN: [id number]   Patient came by office yesterday evening for BP check.  Wrist BP monitor was brought with patient & checked against our BP monitor.  L arm - 119/71  79  (our machine)  L arm - 113/54  83  (her machine)  Dr. Purvis SheffieldKoneswaran aware, she will proceed with monitoring at home over the next 2 weeks and return to office for MD review.    Dr. Purvis SheffieldKoneswaran / G. Loray Akard, LPN

## 2014-01-19 ENCOUNTER — Other Ambulatory Visit: Payer: Self-pay | Admitting: Cardiovascular Disease

## 2014-01-19 LAB — BASIC METABOLIC PANEL
BUN: 22 mg/dL (ref 6–23)
CALCIUM: 9.3 mg/dL (ref 8.4–10.5)
CO2: 29 meq/L (ref 19–32)
Chloride: 102 mEq/L (ref 96–112)
Creat: 0.94 mg/dL (ref 0.50–1.10)
Glucose, Bld: 97 mg/dL (ref 70–99)
Potassium: 4.3 mEq/L (ref 3.5–5.3)
Sodium: 139 mEq/L (ref 135–145)

## 2014-01-19 LAB — MAGNESIUM: Magnesium: 1.9 mg/dL (ref 1.5–2.5)

## 2014-01-26 ENCOUNTER — Telehealth (INDEPENDENT_AMBULATORY_CARE_PROVIDER_SITE_OTHER): Payer: Self-pay | Admitting: *Deleted

## 2014-01-26 ENCOUNTER — Encounter (INDEPENDENT_AMBULATORY_CARE_PROVIDER_SITE_OTHER): Payer: Self-pay | Admitting: Internal Medicine

## 2014-01-26 ENCOUNTER — Ambulatory Visit (INDEPENDENT_AMBULATORY_CARE_PROVIDER_SITE_OTHER): Payer: Medicare Other | Admitting: Internal Medicine

## 2014-01-26 VITALS — BP 110/72 | HR 80 | Temp 97.6°F | Resp 18 | Ht 61.5 in | Wt 217.9 lb

## 2014-01-26 DIAGNOSIS — Z862 Personal history of diseases of the blood and blood-forming organs and certain disorders involving the immune mechanism: Secondary | ICD-10-CM

## 2014-01-26 DIAGNOSIS — K2270 Barrett's esophagus without dysplasia: Secondary | ICD-10-CM

## 2014-01-26 DIAGNOSIS — K219 Gastro-esophageal reflux disease without esophagitis: Secondary | ICD-10-CM

## 2014-01-26 LAB — CBC
HCT: 29.8 % — ABNORMAL LOW (ref 36.0–46.0)
Hemoglobin: 9.9 g/dL — ABNORMAL LOW (ref 12.0–15.0)
MCH: 27.2 pg (ref 26.0–34.0)
MCHC: 33.2 g/dL (ref 30.0–36.0)
MCV: 81.9 fL (ref 78.0–100.0)
Platelets: 244 10*3/uL (ref 150–400)
RBC: 3.64 MIL/uL — AB (ref 3.87–5.11)
RDW: 14.7 % (ref 11.5–15.5)
WBC: 7.3 10*3/uL (ref 4.0–10.5)

## 2014-01-26 NOTE — Progress Notes (Addendum)
Presenting complaint;  Follow for chronic GERD and iron deficiency anemia.  Subjective:  Patient is 73 year old Caucasian female who was history of iron deficiency anemia and chronic GERD complicated by short segment Barrett's esophagus who presents for yearly visit. Last EGD was in January 2012 and was negative for dysplasia. She states she rarely has heartburn. She states  Famotidine at bedtime was added by Dr. Angelique HolmWord because of pulmonary problems. She denies dysphagia. She has good appetite. He has managed to lose 4 pounds since her last visit. However she states 3 months ago she weighed 234 lbs. she has tendency to constipation and is using Colace on an as-needed basis. She denies melena or rectal bleeding. She rarely takes 800 mg of ibuprofen. She usually takes 2-4 mg which is not very often. Patient's last colonoscopy was in December 2010. She has not had recent CBC.     Current Medications: Outpatient Encounter Prescriptions as of 01/26/2014  Medication Sig  . acetaminophen (TYLENOL) 500 MG tablet Take 500 mg by mouth as needed.  . cetirizine (ZYRTEC) 10 MG tablet Take 10 mg by mouth daily.  . famotidine (PEPCID) 20 MG tablet One at bedtime  . fluticasone (FLONASE) 50 MCG/ACT nasal spray Place 2 sprays into the nose as needed.  . furosemide (LASIX) 20 MG tablet Take 1 tablet (20 mg total) by mouth daily.  Marland Kitchen. ibuprofen (ADVIL,MOTRIN) 800 MG tablet Take 800 mg by mouth every 8 (eight) hours as needed for pain.  Marland Kitchen. levothyroxine (SYNTHROID, LEVOTHROID) 137 MCG tablet Take 137 mcg by mouth daily before breakfast.  . losartan (COZAAR) 100 MG tablet Take 100 mg by mouth daily.  Marland Kitchen. omeprazole (PRILOSEC) 20 MG capsule Take 1 capsule (20 mg total) by mouth daily.  Marland Kitchen. rOPINIRole (REQUIP) 0.5 MG tablet Take 0.5 mg by mouth at bedtime as needed.  Marland Kitchen. Umeclidinium-Vilanterol 62.5-25 MCG/INH AEPB Inhale 2 puffs into the lungs daily. Only open the device one time and then take your two separate drags to be  sure you get it all     Objective: Blood pressure 110/72, pulse 80, temperature 97.6 F (36.4 C), temperature source Oral, resp. rate 18, height 5' 1.5" (1.562 m), weight 217 lb 14.4 oz (98.839 kg). Patient is alert and in no acute distress. Conjunctiva is pink. Sclera is nonicteric Oropharyngeal mucosa is normal. No neck masses or thyromegaly noted. Cardiac exam with regular rhythm normal S1 and S2. No murmur or gallop noted. Lungs are clear to auscultation. Abdomen is full but soft and nontender without organomegaly or masses.  No LE edema or clubbing noted.  Labs/studies Results: None available.   Assessment:  #1.GERD complicated by short segment Barrett's esophagus. Symptoms are well controlled with therapy.  #2.History of iron deficiency anemia. She had EGD and colonoscopy in December 2010.   Plan:  Patient will go to the lab for CBC, serum iron, TIBC and serum ferritin. Office visit in one year.

## 2014-01-26 NOTE — Telephone Encounter (Signed)
Steward DroneBrenda called back with Dr. Patty Sermonsehman's answer about her sisters. The oldest sister had precancerous polyps and were removed. The next to youngest sister had precancerous polyps twice and were removed.

## 2014-01-26 NOTE — Patient Instructions (Signed)
Physician will call with results of blood tests 

## 2014-01-26 NOTE — Telephone Encounter (Signed)
Forwarded to Dr.Rehman for FYI 

## 2014-01-27 LAB — FERRITIN: FERRITIN: 64 ng/mL (ref 10–291)

## 2014-01-27 LAB — IRON AND TIBC
%SAT: 11 % — ABNORMAL LOW (ref 20–55)
Iron: 53 ug/dL (ref 42–145)
TIBC: 466 ug/dL (ref 250–470)
UIBC: 413 ug/dL — ABNORMAL HIGH (ref 125–400)

## 2014-01-27 NOTE — Telephone Encounter (Signed)
Talked with patient. From her description it appears patient's 2 sisters had simple tubular adenomas. She has no symptoms and would like to wait another 5 years before her next colonoscopy.

## 2014-01-29 ENCOUNTER — Telehealth (INDEPENDENT_AMBULATORY_CARE_PROVIDER_SITE_OTHER): Payer: Self-pay | Admitting: *Deleted

## 2014-01-29 NOTE — Telephone Encounter (Signed)
Steward DroneBrenda would like to get her lab results. The return phone number is [phone].

## 2014-01-29 NOTE — Telephone Encounter (Signed)
Results given to patient. I will leave up for Dr. Karilyn Cotaehman.

## 2014-02-01 ENCOUNTER — Telehealth (INDEPENDENT_AMBULATORY_CARE_PROVIDER_SITE_OTHER): Payer: Self-pay | Admitting: *Deleted

## 2014-02-01 DIAGNOSIS — D509 Iron deficiency anemia, unspecified: Secondary | ICD-10-CM

## 2014-02-01 NOTE — Telephone Encounter (Signed)
Per Dr.Rehman the patient will need to have labs drawn in 8 weeks. 

## 2014-02-05 ENCOUNTER — Telehealth: Payer: Self-pay | Admitting: *Deleted

## 2014-02-05 MED ORDER — AMLODIPINE BESYLATE 5 MG PO TABS: 5.0000 mg | ORAL_TABLET | Freq: Every day | ORAL | Status: DC

## 2014-02-05 NOTE — Telephone Encounter (Signed)
See most recent BP log scanned into EP[employer].  Per Dr. Purvis SheffieldKoneswaran review - begin Norvasc 5mg  daily.  Patient notified.  New RX sent to [facility].  Follow up OV already scheduled for 02/16/2014 with Dr. Purvis SheffieldKoneswaran.

## 2014-02-16 ENCOUNTER — Ambulatory Visit (INDEPENDENT_AMBULATORY_CARE_PROVIDER_SITE_OTHER): Payer: Medicare Other | Admitting: Cardiovascular Disease

## 2014-02-16 ENCOUNTER — Encounter: Payer: Self-pay | Admitting: Cardiovascular Disease

## 2014-02-16 VITALS — BP 130/69 | HR 93 | Ht 61.5 in | Wt 223.0 lb

## 2014-02-16 DIAGNOSIS — Z79899 Other long term (current) drug therapy: Secondary | ICD-10-CM

## 2014-02-16 DIAGNOSIS — I10 Essential (primary) hypertension: Secondary | ICD-10-CM

## 2014-02-16 DIAGNOSIS — R609 Edema, unspecified: Secondary | ICD-10-CM

## 2014-02-16 DIAGNOSIS — R600 Localized edema: Secondary | ICD-10-CM

## 2014-02-16 MED ORDER — POTASSIUM CHLORIDE CRYS ER 20 MEQ PO TBCR: 20.0000 meq | EXTENDED_RELEASE_TABLET | Freq: Every day | ORAL | Status: DC

## 2014-02-16 MED ORDER — FUROSEMIDE 40 MG PO TABS: 40.0000 mg | ORAL_TABLET | Freq: Every day | ORAL | Status: DC

## 2014-02-16 NOTE — Addendum Note (Signed)
Addended by: Lesle Chris on: 02/16/2014 02:16 PM   Modules accepted: Orders

## 2014-02-16 NOTE — Patient Instructions (Signed)
   Increase Lasix to 40mg  daily  Begin Potassium daily   New prescriptions sent to pharmacy  Continue all other medications.     Lab for BMET - due in 1 week, around 02/23/14 Office will contact with results via phone or letter.   Low compression, knee high stockings Follow up in  2 months

## 2014-02-16 NOTE — Progress Notes (Signed)
Patient ID: Yolanda [name], female   DOB: 08/19/1941, 73 y.o.   MRN: [id number]      SUBJECTIVE: Yolanda [name]'s periankle and foot edema has not been alleviated with Lasix 20 mg daily. When her legs are swollen, she has a burning sensation on the bottoms of both feet. She has been checking her blood pressures at home regularly, and they have been well controlled. She started amlodipine 5 mg daily approximately 2 weeks ago, and her leg swelling did not get any worse.  Her most recent echocardiogram which I personally interpreted showed normal left ventricular systolic function, EF 60-65%, mild LVH, grade 1 diastolic dysfunction, elevated filling pressures, and a trivial pericardial effusion.  Lower extremity ultrasonography in January 2015 showed no evidence of DVT.     Allergies  Allergen Reactions  . Fish Oil     Patient face drew to the side,Bells Palsey  . Ciprofloxacin     REACTION: sob,tachycardia  . Guaifenesin     REACTION: sob,tachycardia  . Tramadol     insomnia    Current Outpatient Prescriptions  Medication Sig Dispense Refill  . acetaminophen (TYLENOL) 500 MG tablet Take 500 mg by mouth as needed.      Marland Kitchen amLODipine (NORVASC) 5 MG tablet Take 1 tablet (5 mg total) by mouth daily.  30 tablet  6  . cetirizine (ZYRTEC) 10 MG tablet Take 10 mg by mouth daily.      . famotidine (PEPCID) 20 MG tablet One at bedtime  30 tablet  2  . fluticasone (FLONASE) 50 MCG/ACT nasal spray Place 2 sprays into the nose as needed.      . furosemide (LASIX) 20 MG tablet Take 1 tablet (20 mg total) by mouth daily.  30 tablet  6  . ibuprofen (ADVIL,MOTRIN) 800 MG tablet Take 800 mg by mouth every 8 (eight) hours as needed for pain.      Marland Kitchen levothyroxine (SYNTHROID, LEVOTHROID) 137 MCG tablet Take 137 mcg by mouth daily before breakfast.      . losartan (COZAAR) 100 MG tablet Take 100 mg by mouth daily.      Marland Kitchen omeprazole (PRILOSEC) 20 MG capsule Take 1 capsule (20 mg total) by mouth daily.  90  capsule  3  . rOPINIRole (REQUIP) 0.5 MG tablet Take 0.5 mg by mouth at bedtime as needed.      Marland Kitchen Umeclidinium-Vilanterol 62.5-25 MCG/INH AEPB Inhale 2 puffs into the lungs daily. Only open the device one time and then take your two separate drags to be sure you get it all       No current facility-administered medications for this visit.    Past Medical History  Diagnosis Date  . Thyroid condition   . Hypertension   . GERD (gastroesophageal reflux disease)   . COPD (chronic obstructive pulmonary disease)   . Bronchitis   . Barrett esophagus     Past Surgical History  Procedure Laterality Date  . Cholecystectomy    . Colonoscopy    . Upper gastrointestinal endoscopy    . Right breast biopsy  Sept, 2012    History   Social History  . Marital Status: Widowed    Spouse Name: N/A    Number of Children: N/A  . Years of Education: N/A   Occupational History  . Not on file.   Social History Main Topics  . Smoking status: Former Smoker -- 0.50 packs/day for 30 years    Types: Cigarettes    Quit date: 11/13/1999  .  Smokeless tobacco: Never Used  . Alcohol Use: No  . Drug Use: No  . Sexual Activity: Not on file   Other Topics Concern  . Not on file   Social History Narrative  . No narrative on file     Filed Vitals:   02/16/14 1315  BP: 130/69  Pulse: 93  Height: 5' 1.5" (1.562 m)  Weight: 223 lb (101.152 kg)  SpO2: 96%    PHYSICAL EXAM General: NAD Neck: No JVD, no thyromegaly. Lungs: Clear to auscultation bilaterally with normal respiratory effort. CV: Nondisplaced PMI.  Regular rate and rhythm, normal S1/S2, no S3/S4, no murmur. No pretibial or periankle edema.  No carotid bruit.  Normal pedal pulses.  Abdomen: Soft, nontender, no hepatosplenomegaly, no distention.  Neurologic: Alert and oriented x 3.  Psych: Normal affect. Extremities: No clubbing or cyanosis.   ECG: reviewed and available in electronic records.      ASSESSMENT AND PLAN: 1.  Pedal edema: This is likely due to mild hypertensive heart disease and essential hypertension. I will increase Lasix to 40 mg daily, add 20 meq KCl daily, and check a basic metabolic panel in one week, to make certain she is not hypokalemic and to make certain she has no decompensation of renal function. Today her blood pressure is normal. I will also prescribe knee-high compression stockings. 2. HTN: Currently controlled with the addition of amlodipine 5 mg daily.  Dispo: f/u 2 months.  Prentice DockerSuresh Velvet Moomaw, M.D., F.A.C.C.

## 2014-02-23 ENCOUNTER — Other Ambulatory Visit: Payer: Self-pay | Admitting: Cardiovascular Disease

## 2014-02-24 LAB — BASIC METABOLIC PANEL
BUN: 29 mg/dL — ABNORMAL HIGH (ref 6–23)
CHLORIDE: 103 meq/L (ref 96–112)
CO2: 25 mEq/L (ref 19–32)
Calcium: 9.2 mg/dL (ref 8.4–10.5)
Creat: 1.12 mg/dL — ABNORMAL HIGH (ref 0.50–1.10)
GLUCOSE: 99 mg/dL (ref 70–99)
POTASSIUM: 4.7 meq/L (ref 3.5–5.3)
SODIUM: 140 meq/L (ref 135–145)

## 2014-02-26 ENCOUNTER — Telehealth: Payer: Self-pay | Admitting: *Deleted

## 2014-02-26 ENCOUNTER — Other Ambulatory Visit: Payer: Self-pay | Admitting: *Deleted

## 2014-02-26 DIAGNOSIS — R600 Localized edema: Secondary | ICD-10-CM

## 2014-02-26 NOTE — Telephone Encounter (Signed)
Called pt with lab results, informed her of lasix change. 40 mg with a alternate 20 mg every other day. Made a follow up lab appointment for a 1 week BMET F/U, pt agreeable to plan.

## 2014-03-02 ENCOUNTER — Ambulatory Visit (HOSPITAL_COMMUNITY)
Admission: RE | Admit: 2014-03-02 | Discharge: 2014-03-02 | Disposition: A | Discharge status: Home / Self Care | Payer: Medicare Other | Source: Ambulatory Visit | Attending: Pulmonary Disease | Admitting: Pulmonary Disease

## 2014-03-02 ENCOUNTER — Other Ambulatory Visit (HOSPITAL_COMMUNITY): Payer: Self-pay | Admitting: Pulmonary Disease

## 2014-03-02 DIAGNOSIS — M25569 Pain in unspecified knee: Secondary | ICD-10-CM | POA: Insufficient documentation

## 2014-03-02 DIAGNOSIS — M25469 Effusion, unspecified knee: Secondary | ICD-10-CM | POA: Insufficient documentation

## 2014-03-02 DIAGNOSIS — R52 Pain, unspecified: Secondary | ICD-10-CM

## 2014-03-06 LAB — BASIC METABOLIC PANEL
BUN: 28 mg/dL — ABNORMAL HIGH (ref 6–23)
CALCIUM: 9.2 mg/dL (ref 8.4–10.5)
CO2: 23 meq/L (ref 19–32)
Chloride: 105 mEq/L (ref 96–112)
Creat: 1.17 mg/dL — ABNORMAL HIGH (ref 0.50–1.10)
GLUCOSE: 91 mg/dL (ref 70–99)
Potassium: 4.7 mEq/L (ref 3.5–5.3)
SODIUM: 141 meq/L (ref 135–145)

## 2014-03-10 ENCOUNTER — Other Ambulatory Visit (INDEPENDENT_AMBULATORY_CARE_PROVIDER_SITE_OTHER): Payer: Self-pay | Admitting: *Deleted

## 2014-03-10 ENCOUNTER — Encounter (INDEPENDENT_AMBULATORY_CARE_PROVIDER_SITE_OTHER): Payer: Self-pay | Admitting: *Deleted

## 2014-03-10 DIAGNOSIS — D509 Iron deficiency anemia, unspecified: Secondary | ICD-10-CM

## 2014-03-11 ENCOUNTER — Telehealth: Payer: Self-pay | Admitting: *Deleted

## 2014-03-11 NOTE — Telephone Encounter (Signed)
Notes Recorded by Lesle ChrisAngela G Hill, LPN on 4/09/81196/18/2015 at 1:31 PM Patient notified and verbalized understanding.   Already has follow up scheduled for 04/19/2014 with Dr. Purvis SheffieldKoneswaran.

## 2014-03-11 NOTE — Telephone Encounter (Signed)
Message copied by Lesle ChrisHILL, ANGELA G on Thu Mar 11, 2014  1:31 PM ------      Message from: Jodelle GrossLAWRENCE, KATHRYN M      Created: Mon Mar 08, 2014  7:47 AM       Creatinine slightly elevated. Will continue to monitor ------

## 2014-03-17 ENCOUNTER — Other Ambulatory Visit: Payer: Self-pay | Admitting: *Deleted

## 2014-03-17 MED ORDER — FUROSEMIDE 40 MG PO TABS: ORAL_TABLET | ORAL | Status: DC

## 2014-03-31 LAB — HEMOGLOBIN AND HEMATOCRIT, BLOOD
HEMATOCRIT: 29.8 % — AB (ref 36.0–46.0)
Hemoglobin: 10.1 g/dL — ABNORMAL LOW (ref 12.0–15.0)

## 2014-04-05 ENCOUNTER — Telehealth (INDEPENDENT_AMBULATORY_CARE_PROVIDER_SITE_OTHER): Payer: Self-pay | Admitting: *Deleted

## 2014-04-05 DIAGNOSIS — D509 Iron deficiency anemia, unspecified: Secondary | ICD-10-CM

## 2014-04-05 NOTE — Telephone Encounter (Signed)
Per Dr.Rehman the patient will need to have labs drawn in 8 weeks. 

## 2014-04-05 NOTE — Telephone Encounter (Signed)
Per Dr.Rehman the patient will need to have labs drawn. 

## 2014-04-19 ENCOUNTER — Ambulatory Visit (INDEPENDENT_AMBULATORY_CARE_PROVIDER_SITE_OTHER): Payer: Medicare Other | Admitting: Cardiovascular Disease

## 2014-04-19 ENCOUNTER — Encounter: Payer: Self-pay | Admitting: Cardiovascular Disease

## 2014-04-19 VITALS — BP 110/67 | HR 80 | Ht 61.0 in | Wt 223.0 lb

## 2014-04-19 DIAGNOSIS — R600 Localized edema: Secondary | ICD-10-CM

## 2014-04-19 DIAGNOSIS — N183 Chronic kidney disease, stage 3 unspecified: Secondary | ICD-10-CM

## 2014-04-19 DIAGNOSIS — I10 Essential (primary) hypertension: Secondary | ICD-10-CM

## 2014-04-19 DIAGNOSIS — R609 Edema, unspecified: Secondary | ICD-10-CM

## 2014-04-19 MED ORDER — FUROSEMIDE 20 MG PO TABS: ORAL_TABLET | ORAL | Status: DC

## 2014-04-19 NOTE — Progress Notes (Signed)
Patient ID: Yolanda MurrainBrenda W [name], female   DOB: 1941/04/19, 73 y.o.   MRN: [id number]      SUBJECTIVE: The patient is here to followup for pedal edema and hypertension. Since starting to wear compression stockings, she has noticed decreased leg and feet swelling. She has been monitoring her blood pressures and systolic readings have primarily been in the 120-130 mm mercury range. She denies chest pain. Her chronic dyspnea is unchanged. She has lumbar spinal stenosis and recently received injections for this and has been given pain medication patches. She does not necessarily avoid high sodium foods.    Allergies  Allergen Reactions  . Fish Oil     Patient face drew to the side,Bells Palsey  . Ciprofloxacin     REACTION: sob,tachycardia  . Guaifenesin     REACTION: sob,tachycardia  . Tramadol     insomnia    Current Outpatient Prescriptions  Medication Sig Dispense Refill  . acetaminophen (TYLENOL) 500 MG tablet Take 500 mg by mouth as needed.      Marland Kitchen. albuterol (PROVENTIL) (2.5 MG/3ML) 0.083% nebulizer solution Take 2.5 mg by nebulization as needed for wheezing or shortness of breath.      Marland Kitchen. amLODipine (NORVASC) 5 MG tablet Take 1 tablet (5 mg total) by mouth daily.  30 tablet  6  . cetirizine (ZYRTEC) 10 MG tablet Take 10 mg by mouth daily.      . famotidine (PEPCID) 20 MG tablet One at bedtime  30 tablet  2  . fluticasone (FLONASE) 50 MCG/ACT nasal spray Place 2 sprays into the nose as needed.      . furosemide (LASIX) 40 MG tablet Take 40mg  every other day, alternating with 20mg  every other day      . ibuprofen (ADVIL,MOTRIN) 800 MG tablet Take 800 mg by mouth every 8 (eight) hours as needed for pain.      Marland Kitchen. levothyroxine (SYNTHROID, LEVOTHROID) 137 MCG tablet Take 137 mcg by mouth daily before breakfast.      . losartan (COZAAR) 100 MG tablet Take 100 mg by mouth daily.      Marland Kitchen. omeprazole (PRILOSEC) 20 MG capsule Take 1 capsule (20 mg total) by mouth daily.  90 capsule  3  . potassium  chloride SA (K-DUR,KLOR-CON) 20 MEQ tablet Take 1 tablet (20 mEq total) by mouth daily.  30 tablet  6  . Umeclidinium-Vilanterol 62.5-25 MCG/INH AEPB Inhale 2 puffs into the lungs daily. Only open the device one time and then take your two separate drags to be sure you get it all       No current facility-administered medications for this visit.    Past Medical History  Diagnosis Date  . Thyroid condition   . Hypertension   . GERD (gastroesophageal reflux disease)   . COPD (chronic obstructive pulmonary disease)   . Bronchitis   . Barrett esophagus     Past Surgical History  Procedure Laterality Date  . Cholecystectomy    . Colonoscopy    . Upper gastrointestinal endoscopy    . Right breast biopsy  Sept, 2012    History   Social History  . Marital Status: Widowed    Spouse Name: N/A    Number of Children: N/A  . Years of Education: N/A   Occupational History  . Not on file.   Social History Main Topics  . Smoking status: Former Smoker -- 0.50 packs/day for 30 years    Types: Cigarettes    Quit date: 11/13/1999  .  Smokeless tobacco: Never Used  . Alcohol Use: No  . Drug Use: No  . Sexual Activity: Not on file   Other Topics Concern  . Not on file   Social History Narrative  . No narrative on file     Filed Vitals:   04/19/14 1519  BP: 110/67  Pulse: 80  Height: 5\' 1"  (1.549 m)  Weight: 223 lb (101.152 kg)  SpO2: 94%    PHYSICAL EXAM General: NAD Neck: No JVD, no thyromegaly. Lungs: Clear to auscultation bilaterally with normal respiratory effort. CV: Nondisplaced PMI.  Regular rate and rhythm, normal S1/S2, no S3/S4, no murmur. Trace periankle and dorsal pedal edema b/l.  Normal pedal pulses.  Abdomen: Soft, nontender, obese, no distention.  Neurologic: Alert and oriented x 3.  Psych: Normal affect. Extremities: No clubbing or cyanosis.   ECG: reviewed and available in electronic records.      ASSESSMENT AND PLAN: 1. Pedal edema: This is  likely due to mild hypertensive heart disease and essential hypertension. On 02/23/2014, BUN 29 and creatinine 1.12. I then had her alternate Lasix 40 mg and 20 mg on alternate days. On 03/05/2014, BUN 28, creatinine 1.17. Will prescribe Lasix 20 mg daily Mon-Fri and 40 mg on Sat and Sun. Repeat BMET in 1 month.Today her blood pressure is normal. I will also have her continue to wear knee-high compression stockings.  2. HTN: Currently controlled with the addition of amlodipine 5 mg daily.  3. CKD stage 3: Will prescribe Lasix 20 mg daily Mon-Fri and 40 mg on Sat and Sun. Repeat BMET in 1 month.  Dispo: f/u 6 months.  Prentice Docker, M.D., F.A.C.C.

## 2014-04-19 NOTE — Patient Instructions (Signed)
   Change Lasix to 20mg  daily on Monday thru Friday & 40mg  daily on Saturday and Sunday - new sent to pharm Continue all other medications.   Lab for BMET - due in 1 month  Office will contact with results via phone or letter.   Your physician wants you to follow up in: 6 months.  You will receive a reminder letter in the mail one-two months in advance.  If you don't receive a letter, please call our office to schedule the follow up appointment

## 2014-04-19 NOTE — Addendum Note (Signed)
Addended by: Lesle ChrisHILL, Yasmeen Manka G on: 04/19/2014 03:56 PM   Modules accepted: Orders

## 2014-04-20 NOTE — Addendum Note (Signed)
Addended by: Jerrye BeaversJONES, Zylen Wenig on: 04/20/2014 01:04 PM   Modules accepted: Orders

## 2014-05-12 ENCOUNTER — Encounter (INDEPENDENT_AMBULATORY_CARE_PROVIDER_SITE_OTHER): Payer: Self-pay | Admitting: *Deleted

## 2014-05-12 ENCOUNTER — Other Ambulatory Visit (INDEPENDENT_AMBULATORY_CARE_PROVIDER_SITE_OTHER): Payer: Self-pay | Admitting: *Deleted

## 2014-05-12 DIAGNOSIS — D509 Iron deficiency anemia, unspecified: Secondary | ICD-10-CM

## 2014-05-24 ENCOUNTER — Other Ambulatory Visit: Payer: Self-pay | Admitting: Cardiovascular Disease

## 2014-05-25 ENCOUNTER — Telehealth: Payer: Self-pay | Admitting: *Deleted

## 2014-05-25 LAB — BASIC METABOLIC PANEL
BUN: 20 mg/dL (ref 6–23)
CO2: 26 mEq/L (ref 19–32)
Calcium: 9.1 mg/dL (ref 8.4–10.5)
Chloride: 110 mEq/L (ref 96–112)
Creat: 1.09 mg/dL (ref 0.50–1.10)
GLUCOSE: 101 mg/dL — AB (ref 70–99)
Potassium: 5.1 mEq/L (ref 3.5–5.3)
SODIUM: 140 meq/L (ref 135–145)

## 2014-05-25 LAB — HEMOGLOBIN AND HEMATOCRIT, BLOOD
HCT: 28.9 % — ABNORMAL LOW (ref 36.0–46.0)
HEMOGLOBIN: 9.6 g/dL — AB (ref 12.0–15.0)

## 2014-05-25 NOTE — Telephone Encounter (Signed)
Pt notified of result and forwarded to pcp

## 2014-06-01 ENCOUNTER — Telehealth (INDEPENDENT_AMBULATORY_CARE_PROVIDER_SITE_OTHER): Payer: Self-pay | Admitting: *Deleted

## 2014-06-01 ENCOUNTER — Encounter (INDEPENDENT_AMBULATORY_CARE_PROVIDER_SITE_OTHER): Payer: Self-pay | Admitting: Internal Medicine

## 2014-06-01 DIAGNOSIS — D509 Iron deficiency anemia, unspecified: Secondary | ICD-10-CM

## 2014-06-01 NOTE — Telephone Encounter (Signed)
Per Dr.Rehman the patient will need to have labs drawn in 4 weeks.   

## 2014-06-16 ENCOUNTER — Other Ambulatory Visit (INDEPENDENT_AMBULATORY_CARE_PROVIDER_SITE_OTHER): Payer: Self-pay | Admitting: *Deleted

## 2014-06-16 ENCOUNTER — Encounter (INDEPENDENT_AMBULATORY_CARE_PROVIDER_SITE_OTHER): Payer: Self-pay | Admitting: *Deleted

## 2014-06-16 DIAGNOSIS — D509 Iron deficiency anemia, unspecified: Secondary | ICD-10-CM

## 2014-06-17 ENCOUNTER — Encounter: Payer: Self-pay | Admitting: Cardiovascular Disease

## 2014-06-18 NOTE — Telephone Encounter (Signed)
TC to patient - explained to her that she is due for follow up January 2016 with Dr. Purvis Sheffield.  Last seen July.  Labs were notified by [facility] office & informed stable again today.  Advised to call back with any further concerns.  Patient verbalized understanding.

## 2014-06-29 LAB — FERRITIN: Ferritin: 69 ng/mL (ref 10–291)

## 2014-06-29 LAB — HEMOGLOBIN AND HEMATOCRIT, BLOOD
HEMATOCRIT: 29.7 % — AB (ref 36.0–46.0)
HEMOGLOBIN: 9.9 g/dL — AB (ref 12.0–15.0)

## 2014-06-29 LAB — IRON AND TIBC
%SAT: 16 % — ABNORMAL LOW (ref 20–55)
IRON: 62 ug/dL (ref 42–145)
TIBC: 377 ug/dL (ref 250–470)
UIBC: 315 ug/dL (ref 125–400)

## 2014-07-01 ENCOUNTER — Ambulatory Visit (HOSPITAL_COMMUNITY)
Admission: RE | Admit: 2014-07-01 | Discharge: 2014-07-01 | Disposition: A | Discharge status: Home / Self Care | Payer: Medicare Other | Source: Ambulatory Visit | Attending: Pulmonary Disease | Admitting: Pulmonary Disease

## 2014-07-01 ENCOUNTER — Other Ambulatory Visit (HOSPITAL_COMMUNITY): Payer: Self-pay | Admitting: Pulmonary Disease

## 2014-07-01 DIAGNOSIS — R05 Cough: Secondary | ICD-10-CM | POA: Insufficient documentation

## 2014-07-01 DIAGNOSIS — R079 Chest pain, unspecified: Secondary | ICD-10-CM | POA: Diagnosis present

## 2014-07-01 DIAGNOSIS — R059 Cough, unspecified: Secondary | ICD-10-CM

## 2014-07-05 ENCOUNTER — Telehealth (INDEPENDENT_AMBULATORY_CARE_PROVIDER_SITE_OTHER): Payer: Self-pay | Admitting: *Deleted

## 2014-07-05 DIAGNOSIS — D509 Iron deficiency anemia, unspecified: Secondary | ICD-10-CM

## 2014-07-05 NOTE — Telephone Encounter (Signed)
Per Dr.Rehman the patient will need to have labs drawn in 2 months. 

## 2014-08-11 ENCOUNTER — Encounter (INDEPENDENT_AMBULATORY_CARE_PROVIDER_SITE_OTHER): Payer: Self-pay | Admitting: *Deleted

## 2014-08-11 ENCOUNTER — Other Ambulatory Visit (INDEPENDENT_AMBULATORY_CARE_PROVIDER_SITE_OTHER): Payer: Self-pay | Admitting: *Deleted

## 2014-08-11 DIAGNOSIS — D509 Iron deficiency anemia, unspecified: Secondary | ICD-10-CM

## 2014-09-01 ENCOUNTER — Other Ambulatory Visit: Payer: Self-pay | Admitting: *Deleted

## 2014-09-01 MED ORDER — AMLODIPINE BESYLATE 5 MG PO TABS: 5.0000 mg | ORAL_TABLET | Freq: Every day | ORAL | Status: DC

## 2014-09-03 LAB — CBC
HCT: 32.7 % — ABNORMAL LOW (ref 36.0–46.0)
Hemoglobin: 10.5 g/dL — ABNORMAL LOW (ref 12.0–15.0)
MCH: 28.8 pg (ref 26.0–34.0)
MCHC: 32.1 g/dL (ref 30.0–36.0)
MCV: 89.6 fL (ref 78.0–100.0)
MPV: 9.8 fL (ref 9.4–12.4)
PLATELETS: 247 10*3/uL (ref 150–400)
RBC: 3.65 MIL/uL — ABNORMAL LOW (ref 3.87–5.11)
RDW: 14.6 % (ref 11.5–15.5)
WBC: 10.1 10*3/uL (ref 4.0–10.5)

## 2014-09-20 ENCOUNTER — Telehealth (INDEPENDENT_AMBULATORY_CARE_PROVIDER_SITE_OTHER): Payer: Self-pay | Admitting: *Deleted

## 2014-09-20 DIAGNOSIS — D509 Iron deficiency anemia, unspecified: Secondary | ICD-10-CM

## 2014-09-20 NOTE — Telephone Encounter (Signed)
Per Dr.Rehman the patient will need to have labs drawn in 2 months. 

## 2014-10-13 ENCOUNTER — Encounter (INDEPENDENT_AMBULATORY_CARE_PROVIDER_SITE_OTHER): Payer: Self-pay | Admitting: *Deleted

## 2014-10-13 ENCOUNTER — Other Ambulatory Visit (INDEPENDENT_AMBULATORY_CARE_PROVIDER_SITE_OTHER): Payer: Self-pay | Admitting: *Deleted

## 2014-10-13 DIAGNOSIS — D509 Iron deficiency anemia, unspecified: Secondary | ICD-10-CM

## 2014-10-21 ENCOUNTER — Encounter: Payer: Self-pay | Admitting: Cardiovascular Disease

## 2014-10-21 ENCOUNTER — Ambulatory Visit (INDEPENDENT_AMBULATORY_CARE_PROVIDER_SITE_OTHER): Payer: Medicare Other | Admitting: Cardiovascular Disease

## 2014-10-21 VITALS — BP 130/60 | HR 92 | Ht 61.0 in | Wt 234.0 lb

## 2014-10-21 DIAGNOSIS — R600 Localized edema: Secondary | ICD-10-CM

## 2014-10-21 DIAGNOSIS — N183 Chronic kidney disease, stage 3 unspecified: Secondary | ICD-10-CM

## 2014-10-21 DIAGNOSIS — I10 Essential (primary) hypertension: Secondary | ICD-10-CM

## 2014-10-21 MED ORDER — FUROSEMIDE 20 MG PO TABS
ORAL_TABLET | ORAL | Status: DC
Start: 2014-10-21 — End: 2014-11-13

## 2014-10-21 NOTE — Patient Instructions (Signed)
   Change Lasix to 40mg  every other day alternating with 20mg  every other day - new prescription sent to pharmacy today. Continue all other medications.   Lab for BMET - due in 3 weeks, around 11/11/2014.   Office will contact with results via phone or letter.   Your physician wants you to follow up in:  1 year.  You will receive a reminder letter in the mail one-two months in advance.  If you don't receive a letter, please call our office to schedule the follow up appointment

## 2014-10-21 NOTE — Progress Notes (Signed)
Patient ID: Yolanda [name], female   DOB: 1940/11/13, 74 y.o.   MRN: [id number]      SUBJECTIVE: The patient presents for routine follow up of hypertensive heart disease and pedal edema. She initially lost 15 pounds but required prednisone for a COPD exacerbation and put all the weight back on. She feels that on the days she takes 40 mg of Lasix, her swelling is better controlled. She continues to wear compression stockings. She denies chest pain. She had a bad flare of COPD which she is still getting over.   Review of Systems: As per "subjective", otherwise negative.  Allergies  Allergen Reactions  . Fish Oil     Patient face drew to the side,Bells Palsey  . Ciprofloxacin     REACTION: sob,tachycardia  . Guaifenesin     REACTION: sob,tachycardia  . Tramadol     insomnia    Current Outpatient Prescriptions  Medication Sig Dispense Refill  . acetaminophen (TYLENOL) 500 MG tablet Take 500 mg by mouth as needed.    Marland Kitchen albuterol (PROVENTIL) (2.5 MG/3ML) 0.083% nebulizer solution Take 2.5 mg by nebulization as needed for wheezing or shortness of breath.    Marland Kitchen amLODipine (NORVASC) 5 MG tablet Take 1 tablet (5 mg total) by mouth daily. 30 tablet 1  . cetirizine (ZYRTEC) 10 MG tablet Take 10 mg by mouth daily.    . famotidine (PEPCID) 20 MG tablet One at bedtime 30 tablet 2  . fluticasone (FLONASE) 50 MCG/ACT nasal spray Place 2 sprays into the nose as needed.    . furosemide (LASIX) 20 MG tablet Take one tab ( ) daily on Monday thru Friday & take two tabs ( ) daily on Saturday and Sunday 60 tablet 6  . gabapentin (NEURONTIN) 300 MG capsule Take 1 capsule (300 mg total) by mouth daily.    Marland Kitchen levothyroxine (SYNTHROID, LEVOTHROID) 137 MCG tablet Take 137 mcg by mouth daily before breakfast.    . losartan (COZAAR) 100 MG tablet Take 100 mg by mouth daily.    Marland Kitchen omeprazole (PRILOSEC) 20 MG capsule Take 1 capsule (20 mg total) by mouth daily. 90 capsule 3  . potassium chloride SA  (K-DUR,KLOR-CON) 20 MEQ tablet Take 1 tablet (20 mEq total) by mouth daily. 30 tablet 6  . Umeclidinium-Vilanterol 62.5-25 MCG/INH AEPB Inhale 2 puffs into the lungs daily. Only open the device one time and then take your two separate drags to be sure you get it all     No current facility-administered medications for this visit.    Past Medical History  Diagnosis Date  . Thyroid condition   . Hypertension   . GERD (gastroesophageal reflux disease)   . COPD (chronic obstructive pulmonary disease)   . Bronchitis   . Barrett esophagus     Past Surgical History  Procedure Laterality Date  . Cholecystectomy    . Colonoscopy    . Upper gastrointestinal endoscopy    . Right breast biopsy  Sept, 2012    History   Social History  . Marital Status: Widowed    Spouse Name: N/A    Number of Children: N/A  . Years of Education: N/A   Occupational History  . Not on file.   Social History Main Topics  . Smoking status: Former Smoker -- 0.50 packs/day for 30 years    Types: Cigarettes    Quit date: 11/13/1999  . Smokeless tobacco: Never Used  . Alcohol Use: No  . Drug Use: No  . Sexual Activity: Not  on file   Other Topics Concern  . Not on file   Social History Narrative     Filed Vitals:   10/21/14 1440  BP: 130/60  Pulse: 92  Height: 5\' 1"  (1.549 m)  Weight: 234 lb (106.142 kg)  SpO2: 97%    PHYSICAL EXAM General: NAD HEENT: Normal. Neck: No JVD, no thyromegaly. Lungs: Clear to auscultation bilaterally with normal respiratory effort. CV: Nondisplaced PMI.  Regular rate and rhythm, normal S1/S2, no S3/S4, no murmur. 1+ pretibial and periankle edema.  No carotid bruit.   Abdomen: Soft, obese, no distention.  Neurologic: Alert and oriented x 3.  Psych: Normal affect. Skin: Normal. Musculoskeletal: No gross deformities. Extremities: No clubbing or cyanosis.   ECG: Most recent ECG reviewed.     ASSESSMENT AND PLAN: 1. Pedal edema: This is likely due to  mild hypertensive heart disease and essential hypertension, with obesity also contributing. 05/24/2014, BUN 20 and creatinine 1.09. Will switch to Lasix 40 mg and 20 mg on alternate days.Today her blood pressure is normal. I will also have her continue to wear knee-high compression stockings. Check BMET in 3 weeks. 2. Essential HTN: Well controlled. No changes. 3. CKD stage 3: 05/24/2014, BUN 20 and creatinine 1.09. Repeat BMET in three weeks with Lasix adjustment.  Dispo: f/u 1 year.  Prentice DockerSuresh Tito Ausmus, M.D., F.A.C.C.

## 2014-10-25 ENCOUNTER — Ambulatory Visit (HOSPITAL_COMMUNITY)
Admission: RE | Admit: 2014-10-25 | Discharge: 2014-10-25 | Disposition: A | Discharge status: Home / Self Care | Payer: Medicare Other | Source: Ambulatory Visit | Attending: Pulmonary Disease | Admitting: Pulmonary Disease

## 2014-10-25 ENCOUNTER — Other Ambulatory Visit (HOSPITAL_COMMUNITY): Payer: Self-pay | Admitting: Pulmonary Disease

## 2014-10-25 DIAGNOSIS — Y998 Other external cause status: Secondary | ICD-10-CM | POA: Insufficient documentation

## 2014-10-25 DIAGNOSIS — Y92481 Parking lot as the place of occurrence of the external cause: Secondary | ICD-10-CM | POA: Insufficient documentation

## 2014-10-25 DIAGNOSIS — W19XXXA Unspecified fall, initial encounter: Secondary | ICD-10-CM

## 2014-10-25 DIAGNOSIS — Y939 Activity, unspecified: Secondary | ICD-10-CM | POA: Insufficient documentation

## 2014-10-25 DIAGNOSIS — M25531 Pain in right wrist: Secondary | ICD-10-CM | POA: Insufficient documentation

## 2014-10-25 DIAGNOSIS — W1830XA Fall on same level, unspecified, initial encounter: Secondary | ICD-10-CM | POA: Diagnosis not present

## 2014-10-25 DIAGNOSIS — S6981XA Other specified injuries of right wrist, hand and finger(s), initial encounter: Secondary | ICD-10-CM | POA: Insufficient documentation

## 2014-10-25 DIAGNOSIS — M25431 Effusion, right wrist: Secondary | ICD-10-CM | POA: Diagnosis not present

## 2014-11-06 ENCOUNTER — Inpatient Hospital Stay (HOSPITAL_COMMUNITY)
Admission: EM | Admit: 2014-11-06 | Discharge: 2014-11-13 | DRG: 191 | Disposition: A | Discharge status: Home Health | Payer: Medicare Other | Attending: Pulmonary Disease | Admitting: Pulmonary Disease

## 2014-11-06 ENCOUNTER — Emergency Department (HOSPITAL_COMMUNITY): Payer: Medicare Other

## 2014-11-06 ENCOUNTER — Encounter (HOSPITAL_COMMUNITY): Payer: Self-pay | Admitting: *Deleted

## 2014-11-06 DIAGNOSIS — R0602 Shortness of breath: Secondary | ICD-10-CM | POA: Diagnosis not present

## 2014-11-06 DIAGNOSIS — I10 Essential (primary) hypertension: Secondary | ICD-10-CM | POA: Diagnosis present

## 2014-11-06 DIAGNOSIS — Z801 Family history of malignant neoplasm of trachea, bronchus and lung: Secondary | ICD-10-CM | POA: Diagnosis not present

## 2014-11-06 DIAGNOSIS — Z87891 Personal history of nicotine dependence: Secondary | ICD-10-CM

## 2014-11-06 DIAGNOSIS — M792 Neuralgia and neuritis, unspecified: Secondary | ICD-10-CM

## 2014-11-06 DIAGNOSIS — I110 Hypertensive heart disease with heart failure: Secondary | ICD-10-CM | POA: Diagnosis present

## 2014-11-06 DIAGNOSIS — N183 Chronic kidney disease, stage 3 (moderate): Secondary | ICD-10-CM | POA: Diagnosis present

## 2014-11-06 DIAGNOSIS — G629 Polyneuropathy, unspecified: Secondary | ICD-10-CM | POA: Diagnosis present

## 2014-11-06 DIAGNOSIS — K219 Gastro-esophageal reflux disease without esophagitis: Secondary | ICD-10-CM | POA: Diagnosis present

## 2014-11-06 DIAGNOSIS — E875 Hyperkalemia: Secondary | ICD-10-CM | POA: Diagnosis present

## 2014-11-06 DIAGNOSIS — N179 Acute kidney failure, unspecified: Secondary | ICD-10-CM | POA: Diagnosis present

## 2014-11-06 DIAGNOSIS — Z833 Family history of diabetes mellitus: Secondary | ICD-10-CM

## 2014-11-06 DIAGNOSIS — E669 Obesity, unspecified: Secondary | ICD-10-CM | POA: Diagnosis present

## 2014-11-06 DIAGNOSIS — R739 Hyperglycemia, unspecified: Secondary | ICD-10-CM | POA: Diagnosis present

## 2014-11-06 DIAGNOSIS — Z8249 Family history of ischemic heart disease and other diseases of the circulatory system: Secondary | ICD-10-CM | POA: Diagnosis not present

## 2014-11-06 DIAGNOSIS — J441 Chronic obstructive pulmonary disease with (acute) exacerbation: Secondary | ICD-10-CM | POA: Diagnosis not present

## 2014-11-06 DIAGNOSIS — J449 Chronic obstructive pulmonary disease, unspecified: Secondary | ICD-10-CM | POA: Diagnosis present

## 2014-11-06 DIAGNOSIS — E039 Hypothyroidism, unspecified: Secondary | ICD-10-CM

## 2014-11-06 DIAGNOSIS — I480 Paroxysmal atrial fibrillation: Secondary | ICD-10-CM | POA: Diagnosis present

## 2014-11-06 DIAGNOSIS — I5032 Chronic diastolic (congestive) heart failure: Secondary | ICD-10-CM

## 2014-11-06 LAB — CBC
HEMATOCRIT: 34.9 % — AB (ref 36.0–46.0)
HEMOGLOBIN: 11.5 g/dL — AB (ref 12.0–15.0)
MCH: 30.3 pg (ref 26.0–34.0)
MCHC: 33 g/dL (ref 30.0–36.0)
MCV: 92.1 fL (ref 78.0–100.0)
PLATELETS: 244 10*3/uL (ref 150–400)
RBC: 3.79 MIL/uL — ABNORMAL LOW (ref 3.87–5.11)
RDW: 14 % (ref 11.5–15.5)
WBC: 9.6 10*3/uL (ref 4.0–10.5)

## 2014-11-06 LAB — I-STAT CHEM 8, ED
BUN: 33 mg/dL — ABNORMAL HIGH (ref 6–23)
CALCIUM ION: 1.29 mmol/L (ref 1.13–1.30)
Chloride: 111 mmol/L (ref 96–112)
Creatinine, Ser: 1.5 mg/dL — ABNORMAL HIGH (ref 0.50–1.10)
Glucose, Bld: 189 mg/dL — ABNORMAL HIGH (ref 70–99)
HEMATOCRIT: 35 % — AB (ref 36.0–46.0)
Hemoglobin: 11.9 g/dL — ABNORMAL LOW (ref 12.0–15.0)
Potassium: 6.3 mmol/L (ref 3.5–5.1)
Sodium: 135 mmol/L (ref 135–145)
TCO2: 17 mmol/L (ref 0–100)

## 2014-11-06 LAB — BASIC METABOLIC PANEL
Anion gap: 3 — ABNORMAL LOW (ref 5–15)
BUN: 36 mg/dL — ABNORMAL HIGH (ref 6–23)
CHLORIDE: 110 mmol/L (ref 96–112)
CO2: 20 mmol/L (ref 19–32)
CREATININE: 1.39 mg/dL — AB (ref 0.50–1.10)
Calcium: 9.2 mg/dL (ref 8.4–10.5)
GFR calc Af Amer: 43 mL/min — ABNORMAL LOW (ref >90)
GFR calc non Af Amer: 37 mL/min — ABNORMAL LOW (ref >90)
Glucose, Bld: 187 mg/dL — ABNORMAL HIGH (ref 70–99)
POTASSIUM: 6.3 mmol/L — AB (ref 3.5–5.1)
SODIUM: 133 mmol/L — AB (ref 135–145)

## 2014-11-06 LAB — BLOOD GAS, ARTERIAL
Acid-base deficit: 7.3 mmol/L — ABNORMAL HIGH (ref 0.0–2.0)
Bicarbonate: 17.8 mEq/L — ABNORMAL LOW (ref 20.0–24.0)
Drawn by: 105551
O2 Content: 2 L/min
O2 Saturation: 95.6 %
PCO2 ART: 36.8 mmHg (ref 35.0–45.0)
Patient temperature: 37
TCO2: 16.5 mmol/L (ref 0–100)
pH, Arterial: 7.306 — ABNORMAL LOW (ref 7.350–7.450)
pO2, Arterial: 91.8 mmHg (ref 80.0–100.0)

## 2014-11-06 LAB — TROPONIN I: Troponin I: <0.03 ng/mL (ref <0.031)

## 2014-11-06 LAB — BRAIN NATRIURETIC PEPTIDE: B Natriuretic Peptide: 18 pg/mL (ref 0.0–100.0)

## 2014-11-06 MED ORDER — DOXYCYCLINE HYCLATE 100 MG PO TABS
100.0000 mg | ORAL_TABLET | Freq: Two times a day (BID) | ORAL | Status: DC
  Administered 2014-11-07 – 2014-11-13 (×14): 100 mg via ORAL
  Filled 2014-11-06 (×14): qty 1

## 2014-11-06 MED ORDER — ALBUTEROL SULFATE (2.5 MG/3ML) 0.083% IN NEBU
INHALATION_SOLUTION | RESPIRATORY_TRACT | Status: AC
  Administered 2014-11-06: 2.5 mg
  Filled 2014-11-06: qty 3

## 2014-11-06 MED ORDER — IPRATROPIUM-ALBUTEROL 0.5-2.5 (3) MG/3ML IN SOLN
RESPIRATORY_TRACT | Status: AC
  Administered 2014-11-06: 3 mL
  Filled 2014-11-06: qty 3

## 2014-11-06 MED ORDER — SODIUM CHLORIDE 0.9 % IV BOLUS (SEPSIS)
1000.0000 mL | Freq: Once | INTRAVENOUS | Status: AC
  Administered 2014-11-07: 1000 mL via INTRAVENOUS

## 2014-11-06 MED ORDER — AMLODIPINE BESYLATE 5 MG PO TABS
5.0000 mg | ORAL_TABLET | Freq: Every day | ORAL | Status: DC
  Administered 2014-11-07 – 2014-11-09 (×3): 5 mg via ORAL
  Filled 2014-11-06 (×3): qty 1

## 2014-11-06 MED ORDER — METHYLPREDNISOLONE SODIUM SUCC 125 MG IJ SOLR
125.0000 mg | Freq: Once | INTRAMUSCULAR | Status: AC
  Administered 2014-11-07: 125 mg via INTRAVENOUS

## 2014-11-06 MED ORDER — LEVOTHYROXINE SODIUM 137 MCG PO TABS: 137.0000 ug | ORAL_TABLET | Freq: Every day | ORAL | Status: DC

## 2014-11-06 MED ORDER — IPRATROPIUM-ALBUTEROL 0.5-2.5 (3) MG/3ML IN SOLN
3.0000 mL | Freq: Once | RESPIRATORY_TRACT | Status: AC
  Administered 2014-11-06: 3 mL via RESPIRATORY_TRACT
  Filled 2014-11-06: qty 3

## 2014-11-06 MED ORDER — PANTOPRAZOLE SODIUM 40 MG PO TBEC
40.0000 mg | DELAYED_RELEASE_TABLET | Freq: Every day | ORAL | Status: DC
  Administered 2014-11-07 – 2014-11-13 (×7): 40 mg via ORAL
  Filled 2014-11-06 (×7): qty 1

## 2014-11-06 MED ORDER — FUROSEMIDE 40 MG PO TABS
40.0000 mg | ORAL_TABLET | Freq: Once | ORAL | Status: AC
  Administered 2014-11-06: 40 mg via ORAL
  Filled 2014-11-06: qty 1

## 2014-11-06 MED ORDER — ONDANSETRON HCL 4 MG/2ML IJ SOLN: 4.0000 mg | Freq: Four times a day (QID) | INTRAMUSCULAR | Status: DC | PRN

## 2014-11-06 MED ORDER — OXYCODONE HCL 5 MG PO TABS
5.0000 mg | ORAL_TABLET | ORAL | Status: DC | PRN
  Administered 2014-11-09: 5 mg via ORAL
  Filled 2014-11-06: qty 1

## 2014-11-06 MED ORDER — HEPARIN SODIUM (PORCINE) 5000 UNIT/ML IJ SOLN
5000.0000 [IU] | Freq: Three times a day (TID) | INTRAMUSCULAR | Status: DC
  Administered 2014-11-06 – 2014-11-09 (×8): 5000 [IU] via SUBCUTANEOUS
  Filled 2014-11-06 (×8): qty 1

## 2014-11-06 MED ORDER — SODIUM CHLORIDE 0.9 % IV SOLN
INTRAVENOUS | Status: DC
  Administered 2014-11-06 – 2014-11-07 (×3): via INTRAVENOUS

## 2014-11-06 MED ORDER — IPRATROPIUM-ALBUTEROL 0.5-2.5 (3) MG/3ML IN SOLN
3.0000 mL | RESPIRATORY_TRACT | Status: DC
  Administered 2014-11-07 – 2014-11-09 (×15): 3 mL via RESPIRATORY_TRACT
  Filled 2014-11-06 (×16): qty 3

## 2014-11-06 MED ORDER — SODIUM POLYSTYRENE SULFONATE 15 GM/60ML PO SUSP
15.0000 g | Freq: Once | ORAL | Status: AC
  Administered 2014-11-07: 15 g via ORAL
  Filled 2014-11-06: qty 60

## 2014-11-06 MED ORDER — GABAPENTIN 300 MG PO CAPS
300.0000 mg | ORAL_CAPSULE | Freq: Every evening | ORAL | Status: DC | PRN
  Administered 2014-11-09: 300 mg via ORAL
  Filled 2014-11-06: qty 1

## 2014-11-06 MED ORDER — METHYLPREDNISOLONE SODIUM SUCC 125 MG IJ SOLR
60.0000 mg | Freq: Four times a day (QID) | INTRAMUSCULAR | Status: DC
  Administered 2014-11-07 – 2014-11-12 (×22): 60 mg via INTRAVENOUS
  Filled 2014-11-06 (×23): qty 2

## 2014-11-06 MED ORDER — ONDANSETRON HCL 4 MG PO TABS: 4.0000 mg | ORAL_TABLET | Freq: Four times a day (QID) | ORAL | Status: DC | PRN

## 2014-11-06 MED ORDER — FAMOTIDINE 20 MG PO TABS
20.0000 mg | ORAL_TABLET | Freq: Every day | ORAL | Status: DC
  Administered 2014-11-07 – 2014-11-12 (×7): 20 mg via ORAL
  Filled 2014-11-06 (×7): qty 1

## 2014-11-06 MED ORDER — FUROSEMIDE 40 MG PO TABS
40.0000 mg | ORAL_TABLET | Freq: Every day | ORAL | Status: DC
  Administered 2014-11-07 – 2014-11-13 (×7): 40 mg via ORAL
  Filled 2014-11-06 (×7): qty 1

## 2014-11-06 MED ORDER — POLYETHYLENE GLYCOL 3350 17 G PO PACK: 17.0000 g | PACK | Freq: Every day | ORAL | Status: DC | PRN

## 2014-11-06 MED ORDER — ACETAMINOPHEN 650 MG RE SUPP: 650.0000 mg | Freq: Four times a day (QID) | RECTAL | Status: DC | PRN

## 2014-11-06 MED ORDER — ACETAMINOPHEN 325 MG PO TABS
650.0000 mg | ORAL_TABLET | Freq: Four times a day (QID) | ORAL | Status: DC | PRN
  Administered 2014-11-08 – 2014-11-13 (×4): 650 mg via ORAL
  Filled 2014-11-06 (×5): qty 2

## 2014-11-06 NOTE — ED Notes (Signed)
Pt has copd and has had bronchitis since Christmas. Pt states she began having sob yesterday that is worse today.

## 2014-11-06 NOTE — H&P (Signed)
[facility] History and Physical  Yolanda [name] ZOX:[id number] DOB: 1940-10-06 DOA: 11/06/2014  Referring physician: Dr. Waynetta Sandy - [facility] PCP: Fredirick Maudlin, MD   Chief Complaint: SOB  HPI: Yolanda [name] is a 74 y.o. female  Shortness of breath. Started 6 days ago. No home O2 requirement. Getting worse. Symptoms are now constant. intermittently cough. Worse with exertion. Denies palpitations, chest pain, fevers. Called her PCP who instructed patient to take some "extra prednisone" that patient had from prior prescription. Patient took to the prednisone today without relief. Home nebulizer treatment w/ short lived benefit. Denies chest pain, palpitations, fever, rash, abdominal pain, nausea, vomiting, diarrhea. Very little oral intake during this period of time.  Review of Systems:  Constitutional:  No weight loss, night sweats, Fevers, chills  HEENT:  No headaches, Difficulty swallowing,Tooth/dental problems,Sore throat,  No sneezing, itching, ear ache  Cardio-vascular:  No chest pain, Orthopnea, PND,  anasarca, dizziness, palpitations  GI:  No heartburn, indigestion, abdominal pain, nausea, vomiting, diarrhea, change in bowel habits, loss of appetite  Resp:  Per history of present illness Skin:  no rash or lesions.  GU:  no dysuria, change in color of urine, no urgency or frequency. No flank pain.  Musculoskeletal:   No joint pain or swelling. No decreased range of motion. No back pain.  Psych:  No change in mood or affect. No depression or anxiety. No memory loss.   Past Medical History  Diagnosis Date  . Thyroid condition   . Hypertension   . GERD (gastroesophageal reflux disease)   . COPD (chronic obstructive pulmonary disease)   . Bronchitis   . Barrett esophagus    Past Surgical History  Procedure Laterality Date  . Cholecystectomy    . Colonoscopy    . Upper gastrointestinal endoscopy    . Right breast biopsy  Sept, 2012   Social History:  reports that  she quit smoking about 14 years ago. Her smoking use included Cigarettes. She has a 15 pack-year smoking history. She has never used smokeless tobacco. She reports that she does not drink alcohol or use illicit drugs.  Allergies  Allergen Reactions  . Ciprofloxacin Shortness Of Breath    REACTION: sob,tachycardia  . Fish Oil     Patient face drew to the side,Bells Palsey  . Guaifenesin Shortness Of Breath    REACTION: sob,tachycardia  . Adhesive [Tape] Other (See Comments)    Causes blisters on skin  . Tramadol     insomnia    Family History  Problem Relation Age of Onset  . Dementia Mother   . Lung cancer Father     smoked  . Hypertension Sister   . Diabetes Brother   . Obesity Sister   . Hypertension Sister   . Restless legs syndrome Sister   . Healthy Sister      Prior to Admission medications   Medication Sig Start Date End Date Taking? Authorizing Provider  acetaminophen (TYLENOL) 500 MG tablet Take 500-1,000 mg by mouth as needed for mild pain.    Yes Historical Provider, MD  albuterol (PROVENTIL) (2.5 MG/3ML) 0.083% nebulizer solution Take 2.5 mg by nebulization every 4 (four) hours as needed for wheezing or shortness of breath.    Yes Historical Provider, MD  amLODipine (NORVASC) 5 MG tablet Take 1 tablet (5 mg total) by mouth daily. 09/01/14  Yes Laqueta Linden, MD  cetirizine (ZYRTEC) 10 MG tablet Take 10 mg by mouth daily.   Yes Historical Provider, MD  famotidine (PEPCID) 20 MG tablet One at bedtime Patient taking differently: Take 20 mg by mouth at bedtime.  09/03/13  Yes Nyoka Cowden, MD  flintstones complete (FLINTSTONES) 60 MG chewable tablet Chew 3 tablets by mouth daily.   Yes Historical Provider, MD  Fluticasone Furoate-Vilanterol 200-25 MCG/INH AEPB Inhale 1 puff into the lungs daily.   Yes Historical Provider, MD  furosemide (LASIX) 20 MG tablet Take 2 tabs ( ) every other day alternating with 1 tab ( ) every other day 10/21/14  Yes Laqueta Linden, MD  gabapentin (NEURONTIN) 300 MG capsule Take 300 mg by mouth at bedtime as needed (Restless Legs).  04/20/14  Yes Laqueta Linden, MD  levothyroxine (SYNTHROID, LEVOTHROID) 137 MCG tablet Take 137 mcg by mouth daily before breakfast.   Yes Historical Provider, MD  losartan (COZAAR) 100 MG tablet Take 100 mg by mouth daily. 12/23/13  Yes Historical Provider, MD  omeprazole (PRILOSEC) 20 MG capsule Take 1 capsule (20 mg total) by mouth daily. 12/21/13  Yes Len Blalock, NP  potassium chloride SA (K-DUR,KLOR-CON) 20 MEQ tablet Take 1 tablet (20 mEq total) by mouth daily. 02/16/14  Yes Laqueta Linden, MD  sulfamethoxazole-trimethoprim (BACTRIM DS,SEPTRA DS) 800-160 MG per tablet Take 1 tablet by mouth 2 (two) times daily. 11/02/14  Yes Historical Provider, MD  VOLTAREN 1 % GEL Apply 1 application topically daily as needed. 08/25/14  Yes Historical Provider, MD   Physical Exam: Filed Vitals:   11/06/14 2031 11/06/14 2100 11/06/14 2119 11/06/14 2130  BP: 169/69   136/44  Pulse: 107 96  101  Temp:      TempSrc:      Resp: 24 32  23  Height:      Weight:      SpO2: 98% 97% 98% 96%    Wt Readings from Last 3 Encounters:  11/06/14 102.059 kg (225 lb)  10/21/14 106.142 kg (234 lb)  04/19/14 101.152 kg (223 lb)    General: Appears to be in mild distress. Eyes:  PERRL, normal lids, irises & conjunctiva ENT: Dry mucus membranes Neck:  no LAD, masses or thyromegaly Cardiovascular:  RRR, no m/r/g. Trace LE edema Telemetry:  SR, no arrhythmias  Respiratory: Diffuse wheezing with diminished breath sounds on the left. No crackles. Increased work of breathing. On 2 L nasal cannula.  Abdomen:  soft, ntnd Skin:  no rash or induration seen on limited exam Musculoskeletal:  grossly normal tone BUE/BLE Psychiatric:  grossly normal mood and affect, speech fluent and appropriate Neurologic:  grossly non-focal.          Labs on Admission:  Basic Metabolic Panel:  Recent Labs Lab  11/06/14 2006 11/06/14 2100  NA 133* 135  K 6.3* 6.3*  CL 110 111  CO2 20  --   GLUCOSE 187* 189*  BUN 36* 33*  CREATININE 1.39* 1.50*  CALCIUM 9.2  --    Liver Function Tests: No results for input(s): AST, ALT, ALKPHOS, BILITOT, PROT, ALBUMIN in the last 168 hours. No results for input(s): LIPASE, AMYLASE in the last 168 hours. No results for input(s): AMMONIA in the last 168 hours. CBC:  Recent Labs Lab 11/06/14 2006 11/06/14 2100  WBC 9.6  --   HGB 11.5* 11.9*  HCT 34.9* 35.0*  MCV 92.1  --   PLT 244  --    Cardiac Enzymes:  Recent Labs Lab 11/06/14 2006  TROPONINI <0.03    BNP (last 3 results)  Recent Labs  11/06/14 1958  BNP 18.0    ProBNP (last 3 results) No results for input(s): PROBNP in the last 8760 hours.  CBG: No results for input(s): GLUCAP in the last 168 hours.  Radiological Exams on Admission: Dg Chest 2 View (if Patient Has Fever And/or Copd)  11/06/2014   CLINICAL DATA:  Dyspnea cough and wheezing for 5 days.  EXAM: CHEST  2 VIEW  COMPARISON:  07/01/2014.  FINDINGS: There is moderate hyperinflation, unchanged. The lungs are clear. The pulmonary vasculature is normal. There are no pleural effusions. Hilar, mediastinal and cardiac contours appear unremarkable. There is no significant interval change.  IMPRESSION: Hyperinflation.  No acute cardiopulmonary findings.   Electronically Signed   By: Ellery Plunkaniel R Mitchell M.D.   On: 11/06/2014 20:47    EKG: Independently reviewed. Sinus tach. Incomplete bundle branch block.  Assessment/Plan Principal Problem:   COPD exacerbation Active Problems:   GERD (gastroesophageal reflux disease)   HTN (hypertension)   Neuropathic pain   Hypothyroidism   Chronic diastolic CHF (congestive heart failure)   Hyperkalemia  COPD exacerbation: New O2 requirement, chest x-ray without sign of pneumonia. WBC normal. Afebrile. Shortness of breath likely from CHF exacerbation.  - Admit - Solu-Medrol 60 mg every  6hrs - DuoNeb's every 4 hours - Doxycycline - O2 when necessary - ABG  Acute kidney injury: Creatinine 1.5 on admission. Baseline creatinine 1. Likely secondary to dehydration as patient endorses very little by mouth over the last several days. - 1 L normal saline bolus - Normal saline 125 mL's per hour - BMET in the morning  Hyperkalemia: 6.3 on admission. Likely secondary to acute kidney injury. - Kayexalate and NS - BMET in am  Chronic diastolic CHF: Grade 1 diastolic CHF with 65% EF on last echo from 2015. No signs of acute exacerbation. - Continue daily Lasix.   Hypothyroidism: - Continue Synthroid   Hypertension: Blood pressure elevated on admission. -  continue home Norvasc - Hold losartan until renal function returns to normal. - Hydralazine when necessary systolic blood pressure greater than 180   GERD:  - Continue PPI and H2 blocker   Neuropathic pain: Continue home gabapentin   Code Status: FULL DVT Prophylaxis: Heparin Family Communication: Sister and Niece Disposition Plan: pending improvement   Aylah Yeary J, MD Family Medicine [facility] [email or web address] Password [email or web address]

## 2014-11-06 NOTE — ED Provider Notes (Signed)
CSN: [id number]     Arrival date & time 11/06/14  1929 History   First MD Initiated Contact with Patient 11/06/14 1937     Chief Complaint  Patient presents with  . Shortness of Breath     (Consider location/radiation/quality/duration/timing/severity/associated sxs/prior Treatment) HPI Comments: Pt is a 74 y.o. female presenting with SOB x 6 days. PMH COPD, HTN, ?diastolic CHF, Barrett's esophagus. Pt says started to have SOB and cough about 1 week ago that worsened, however has had issues for the past several months. Cough is non-productive. SOB worsened with exertion. Denies any CP. She called PCP and was told to continue doing breathing treatments, take prednisone that she had from prior episode, and also was prescribed "sulfur" antibiotic which she has taken 2 doses. She felt that the dyspnea on exertion continued to get worse so came to the ED. She states she has not been taking her lasix because it makes her feel fatigued, also not taking kdur since she only takes it with the lasix. She says her left leg is normally swollen, and right now doesn't look as bad as it has in the past but is a little swollen.   Patient is a 74 y.o. female presenting with shortness of breath. The history is provided by the patient. No language interpreter was used.  Shortness of Breath Severity:  Moderate Onset quality:  Gradual Duration:  6 days Timing:  Constant Progression:  Worsening Chronicity:  Recurrent Context: activity   Relieved by: partially relieved by rest. Worsened by:  Activity and exertion Ineffective treatments:  Inhaler (prednisone, sulfur antibiotic) Associated symptoms: cough and wheezing   Associated symptoms: no abdominal pain, no chest pain, no fever, no hemoptysis, no sore throat and no vomiting   Cough:    Cough characteristics:  Non-productive   Severity:  Moderate   Onset quality:  Gradual   Progression:  Worsening   Chronicity:  Recurrent   Past Medical History   Diagnosis Date  . Thyroid condition   . Hypertension   . GERD (gastroesophageal reflux disease)   . COPD (chronic obstructive pulmonary disease)   . Bronchitis   . Barrett esophagus    Past Surgical History  Procedure Laterality Date  . Cholecystectomy    . Colonoscopy    . Upper gastrointestinal endoscopy    . Right breast biopsy  Sept, 2012   Family History  Problem Relation Age of Onset  . Dementia Mother   . Lung cancer Father     smoked  . Hypertension Sister   . Diabetes Brother   . Obesity Sister   . Hypertension Sister   . Restless legs syndrome Sister   . Healthy Sister    History  Substance Use Topics  . Smoking status: Former Smoker -- 0.50 packs/day for 30 years    Types: Cigarettes    Quit date: 11/13/1999  . Smokeless tobacco: Never Used  . Alcohol Use: No   OB History    No data available     Review of Systems  Constitutional: Negative for fever.  HENT: Negative for sore throat.   Respiratory: Positive for cough, shortness of breath and wheezing. Negative for hemoptysis.   Cardiovascular: Positive for leg swelling. Negative for chest pain and palpitations.  Gastrointestinal: Negative for nausea, vomiting, abdominal pain, diarrhea and constipation.  Genitourinary: Negative for dysuria.  Musculoskeletal: Negative for myalgias and arthralgias.  Neurological: Negative for dizziness and light-headedness.  All other systems reviewed and are negative.  Allergies  Ciprofloxacin; Fish oil; Guaifenesin; Adhesive; and Tramadol  Home Medications   Prior to Admission medications   Medication Sig Start Date End Date Taking? Authorizing Provider  acetaminophen (TYLENOL) 500 MG tablet Take 500-1,000 mg by mouth as needed for mild pain.    Yes Historical Provider, MD  albuterol (PROVENTIL) (2.5 MG/3ML) 0.083% nebulizer solution Take 2.5 mg by nebulization every 4 (four) hours as needed for wheezing or shortness of breath.    Yes Historical Provider, MD   amLODipine (NORVASC) 5 MG tablet Take 1 tablet (5 mg total) by mouth daily. 09/01/14  Yes Laqueta Linden, MD  cetirizine (ZYRTEC) 10 MG tablet Take 10 mg by mouth daily.   Yes Historical Provider, MD  famotidine (PEPCID) 20 MG tablet One at bedtime Patient taking differently: Take 20 mg by mouth at bedtime.  09/03/13  Yes Nyoka Cowden, MD  flintstones complete (FLINTSTONES) 60 MG chewable tablet Chew 3 tablets by mouth daily.   Yes Historical Provider, MD  Fluticasone Furoate-Vilanterol 200-25 MCG/INH AEPB Inhale 1 puff into the lungs daily.   Yes Historical Provider, MD  furosemide (LASIX) 20 MG tablet Take 2 tabs ( ) every other day alternating with 1 tab ( ) every other day 10/21/14  Yes Laqueta Linden, MD  gabapentin (NEURONTIN) 300 MG capsule Take 300 mg by mouth at bedtime as needed (Restless Legs).  04/20/14  Yes Laqueta Linden, MD  levothyroxine (SYNTHROID, LEVOTHROID) 137 MCG tablet Take 137 mcg by mouth daily before breakfast.   Yes Historical Provider, MD  losartan (COZAAR) 100 MG tablet Take 100 mg by mouth daily. 12/23/13  Yes Historical Provider, MD  omeprazole (PRILOSEC) 20 MG capsule Take 1 capsule (20 mg total) by mouth daily. 12/21/13  Yes Len Blalock, NP  potassium chloride SA (K-DUR,KLOR-CON) 20 MEQ tablet Take 1 tablet (20 mEq total) by mouth daily. 02/16/14  Yes Laqueta Linden, MD  sulfamethoxazole-trimethoprim (BACTRIM DS,SEPTRA DS) 800-160 MG per tablet Take 1 tablet by mouth 2 (two) times daily. 11/02/14  Yes Historical Provider, MD  VOLTAREN 1 % GEL Apply 1 application topically daily as needed. 08/25/14  Yes Historical Provider, MD   BP 136/44 mmHg  Pulse 101  Temp(Src) 98 F (36.7 C) (Oral)  Resp 23  Ht  (1.575 m)  Wt 225 lb (102.059 kg)  BMI 41.14 kg/m2  SpO2 96% Physical Exam  Constitutional: She is oriented to person, place, and time. She appears well-developed.  HENT:  Head: Normocephalic and atraumatic.  Eyes: EOM are normal.  Pupils are equal, round, and reactive to light.  Cardiovascular: Regular rhythm and normal heart sounds.  Tachycardia present.   Pulses:      Radial pulses are 2+ on the right side, and 2+ on the left side.  Pulmonary/Chest: No respiratory distress. She has decreased breath sounds. She has wheezes in the right upper field, the right middle field, the left upper field and the left middle field. She has rhonchi in the right upper field, the right middle field, the left upper field and the left middle field.  Abdominal: Soft. Bowel sounds are normal. There is no tenderness.  Musculoskeletal: She exhibits edema (left foot, ankle, calf 2+. trace edema on right. Pt states this is normal for her, and it has been worse in the past).  Neurological: She is alert and oriented to person, place, and time.  Skin: Skin is warm and dry.  Psychiatric: She has a normal mood and affect. Her behavior is  normal. Thought content normal.  Nursing note and vitals reviewed.   ED Course  Procedures (including critical care time) Labs Review Labs Reviewed  BASIC METABOLIC PANEL - Abnormal; Notable for the following:    Sodium 133 (*)    Potassium 6.3 (*)    Glucose, Bld 187 (*)    BUN 36 (*)    Creatinine, Ser 1.39 (*)    GFR calc non Af Amer 37 (*)    GFR calc Af Amer 43 (*)    Anion gap 3 (*)    All other components within normal limits  CBC - Abnormal; Notable for the following:    RBC 3.79 (*)    Hemoglobin 11.5 (*)    HCT 34.9 (*)    All other components within normal limits  BLOOD GAS, ARTERIAL - Abnormal; Notable for the following:    pH, Arterial 7.306 (*)    Bicarbonate 17.8 (*)    Acid-base deficit 7.3 (*)    All other components within normal limits  I-STAT CHEM 8, ED - Abnormal; Notable for the following:    Potassium 6.3 (*)    BUN 33 (*)    Creatinine, Ser 1.50 (*)    Glucose, Bld 189 (*)    Hemoglobin 11.9 (*)    HCT 35.0 (*)    All other components within normal limits  TROPONIN I   BRAIN NATRIURETIC PEPTIDE  CBC  COMPREHENSIVE METABOLIC PANEL    Imaging Review Dg Chest 2 View (if Patient Has Fever And/or Copd)  11/06/2014   CLINICAL DATA:  Dyspnea cough and wheezing for 5 days.  EXAM: CHEST  2 VIEW  COMPARISON:  07/01/2014.  FINDINGS: There is moderate hyperinflation, unchanged. The lungs are clear. The pulmonary vasculature is normal. There are no pleural effusions. Hilar, mediastinal and cardiac contours appear unremarkable. There is no significant interval change.  IMPRESSION: Hyperinflation.  No acute cardiopulmonary findings.   Electronically Signed   By: Ellery Plunkaniel R Mitchell M.D.   On: 11/06/2014 20:47     EKG Interpretation   Date/Time:  Saturday November 06 2014 19:40:54 EST Ventricular Rate:  102 PR Interval:  139 QRS Duration: 118 QT Interval:  330 QTC Calculation: 430 R Axis:   85 Text Interpretation:  Sinus tachycardia Incomplete right bundle branch  block Low voltage, extremity and precordial leads No previous tracing  Confirmed by BEATON  MD, ROBERT ([id number]) on 11/06/2014 7:43:24 PM      MDM   Final diagnoses:  COPD exacerbation   CXR hyperinflated but no definite edema/congestion/infiltrate. BNP wnl 18. First troponin negative. BMP with K+ 6.3, confirmed with i-stat. Mild AKI with cr 1.39, most recent in August 1.09.  Likely COPD exacerbation, possible component of diastolic CHF. Continued breathing treatments in ED, po lasix 40mg . Hospitalist to admit.    Nani RavensAndrew M Reeva Davern, MD 11/06/14 2330  Nelia Shiobert L Beaton, MD 11/08/14 2106

## 2014-11-07 LAB — CBC
HEMATOCRIT: 31.9 % — AB (ref 36.0–46.0)
Hemoglobin: 10.2 g/dL — ABNORMAL LOW (ref 12.0–15.0)
MCH: 29.6 pg (ref 26.0–34.0)
MCHC: 32 g/dL (ref 30.0–36.0)
MCV: 92.5 fL (ref 78.0–100.0)
Platelets: 223 10*3/uL (ref 150–400)
RBC: 3.45 MIL/uL — ABNORMAL LOW (ref 3.87–5.11)
RDW: 14 % (ref 11.5–15.5)
WBC: 9.4 10*3/uL (ref 4.0–10.5)

## 2014-11-07 LAB — COMPREHENSIVE METABOLIC PANEL
ALT: 81 U/L — ABNORMAL HIGH (ref 0–35)
AST: 45 U/L — AB (ref 0–37)
Albumin: 3.7 g/dL (ref 3.5–5.2)
Alkaline Phosphatase: 72 U/L (ref 39–117)
Anion gap: 7 (ref 5–15)
BILIRUBIN TOTAL: 0.6 mg/dL (ref 0.3–1.2)
BUN: 31 mg/dL — AB (ref 6–23)
CHLORIDE: 112 mmol/L (ref 96–112)
CO2: 19 mmol/L (ref 19–32)
Calcium: 8.8 mg/dL (ref 8.4–10.5)
Creatinine, Ser: 1.42 mg/dL — ABNORMAL HIGH (ref 0.50–1.10)
GFR calc Af Amer: 42 mL/min — ABNORMAL LOW (ref >90)
GFR calc non Af Amer: 36 mL/min — ABNORMAL LOW (ref >90)
GLUCOSE: 194 mg/dL — AB (ref 70–99)
Potassium: 5.3 mmol/L — ABNORMAL HIGH (ref 3.5–5.1)
Sodium: 138 mmol/L (ref 135–145)
Total Protein: 6.9 g/dL (ref 6.0–8.3)

## 2014-11-07 LAB — BRAIN NATRIURETIC PEPTIDE: B NATRIURETIC PEPTIDE 5: 34 pg/mL (ref 0.0–100.0)

## 2014-11-07 MED ORDER — LEVOTHYROXINE SODIUM 25 MCG PO TABS
137.0000 ug | ORAL_TABLET | Freq: Every day | ORAL | Status: DC
  Administered 2014-11-07 – 2014-11-13 (×7): 137 ug via ORAL
  Filled 2014-11-07 (×14): qty 1

## 2014-11-07 NOTE — Plan of Care (Signed)
Problem: Consults Goal: Pulmonary Consult Outcome: Completed/Met Date Met:  11/07/14 Dr.Hawkins is primary

## 2014-11-07 NOTE — Progress Notes (Signed)
Subjective: She was admitted yesterday with COPD exacerbation. She says she's feeling better. She was also noted to have acute kidney injury and hyperkalemia.  Objective: Vital signs in last 24 hours: Temp:  [98 F (36.7 C)-98.6 F (37 C)] 98.6 F (37 C) (02/14 0611) Pulse Rate:  [96-108] 104 (02/14 0611) Resp:  [20-32] 20 (02/14 0611) BP: (128-173)/(44-84) 136/48 mmHg (02/14 0611) SpO2:  [93 %-99 %] 99 % (02/14 0729) Weight:  [102.059 kg (225 lb)] 102.059 kg (225 lb) (02/13 1932) Weight change:  Last BM Date: 11/06/14  Intake/Output from previous day:    PHYSICAL EXAM General appearance: alert, cooperative and mild distress Resp: rhonchi bilaterally Cardio: regular rate and rhythm, S1, S2 normal, no murmur, click, rub or gallop GI: soft, non-tender; bowel sounds normal; no masses,  no organomegaly Extremities: extremities normal, atraumatic, no cyanosis or edema  Lab Results:  Results for orders placed or performed during the hospital encounter of 11/06/14 (from the past 48 hour(s))  Brain natriuretic peptide     Status: None   Collection Time: 11/06/14  7:58 PM  Result Value Ref Range   B Natriuretic Peptide 18.0 0.0 - 100.0 pg/mL  Basic metabolic panel    (if pt has PMH of COPD)     Status: Abnormal   Collection Time: 11/06/14  8:06 PM  Result Value Ref Range   Sodium 133 (L) 135 - 145 mmol/L   Potassium 6.3 (HH) 3.5 - 5.1 mmol/L    Comment: REPEATED TO VERIFY CRITICAL RESULT CALLED TO, READ BACK BY AND VERIFIED WITH: HILTON,R AT 8:52PM ON 11/06/14 BY MOSLEYJ    Chloride 110 96 - 112 mmol/L   CO2 20 19 - 32 mmol/L   Glucose, Bld 187 (H) 70 - 99 mg/dL   BUN 36 (H) 6 - 23 mg/dL   Creatinine, Ser 1.39 (H) 0.50 - 1.10 mg/dL   Calcium 9.2 8.4 - 10.5 mg/dL   GFR calc non Af Amer 37 (L) >90 mL/min   GFR calc Af Amer 43 (L) >90 mL/min    Comment: (NOTE) The eGFR has been calculated using the CKD EPI equation. This calculation has not been validated in all clinical  situations. eGFR's persistently <90 mL/min signify possible Chronic Kidney Disease.    Anion gap 3 (L) 5 - 15  CBC     (if pt has PMH of COPD)     Status: Abnormal   Collection Time: 11/06/14  8:06 PM  Result Value Ref Range   WBC 9.6 4.0 - 10.5 K/uL   RBC 3.79 (L) 3.87 - 5.11 MIL/uL   Hemoglobin 11.5 (L) 12.0 - 15.0 g/dL   HCT 34.9 (L) 36.0 - 46.0 %   MCV 92.1 78.0 - 100.0 fL   MCH 30.3 26.0 - 34.0 pg   MCHC 33.0 30.0 - 36.0 g/dL   RDW 14.0 11.5 - 15.5 %   Platelets 244 150 - 400 K/uL  Troponin I (if patient has history of COPD)     Status: None   Collection Time: 11/06/14  8:06 PM  Result Value Ref Range   Troponin I <0.03 <0.031 ng/mL    Comment:        NO INDICATION OF MYOCARDIAL INJURY.   I-stat chem 8, ed     Status: Abnormal   Collection Time: 11/06/14  9:00 PM  Result Value Ref Range   Sodium 135 135 - 145 mmol/L   Potassium 6.3 (HH) 3.5 - 5.1 mmol/L   Chloride 111 96 -  112 mmol/L   BUN 33 (H) 6 - 23 mg/dL   Creatinine, Ser 1.50 (H) 0.50 - 1.10 mg/dL   Glucose, Bld 189 (H) 70 - 99 mg/dL   Calcium, Ion 1.29 1.13 - 1.30 mmol/L   TCO2 17 0 - 100 mmol/L   Hemoglobin 11.9 (L) 12.0 - 15.0 g/dL   HCT 35.0 (L) 36.0 - 46.0 %  Blood gas, arterial     Status: Abnormal   Collection Time: 11/06/14 11:12 PM  Result Value Ref Range   O2 Content 2.0 L/min   Delivery systems NASAL CANNULA    pH, Arterial 7.306 (L) 7.350 - 7.450   pCO2 arterial 36.8 35.0 - 45.0 mmHg   pO2, Arterial 91.8 80.0 - 100.0 mmHg   Bicarbonate 17.8 (L) 20.0 - 24.0 mEq/L   TCO2 16.5 0 - 100 mmol/L   Acid-base deficit 7.3 (H) 0.0 - 2.0 mmol/L   O2 Saturation 95.6 %   Patient temperature 37.0    Collection site RADIAL    Drawn by [id number]    Sample type ARTERIAL    Allens test (pass/fail) PASS PASS  CBC     Status: Abnormal   Collection Time: 11/07/14  5:17 AM  Result Value Ref Range   WBC 9.4 4.0 - 10.5 K/uL   RBC 3.45 (L) 3.87 - 5.11 MIL/uL   Hemoglobin 10.2 (L) 12.0 - 15.0 g/dL   HCT 31.9 (L)  36.0 - 46.0 %   MCV 92.5 78.0 - 100.0 fL   MCH 29.6 26.0 - 34.0 pg   MCHC 32.0 30.0 - 36.0 g/dL   RDW 14.0 11.5 - 15.5 %   Platelets 223 150 - 400 K/uL  Comprehensive metabolic panel     Status: Abnormal   Collection Time: 11/07/14  5:17 AM  Result Value Ref Range   Sodium 138 135 - 145 mmol/L   Potassium 5.3 (H) 3.5 - 5.1 mmol/L   Chloride 112 96 - 112 mmol/L   CO2 19 19 - 32 mmol/L   Glucose, Bld 194 (H) 70 - 99 mg/dL   BUN 31 (H) 6 - 23 mg/dL   Creatinine, Ser 1.42 (H) 0.50 - 1.10 mg/dL   Calcium 8.8 8.4 - 10.5 mg/dL   Total Protein 6.9 6.0 - 8.3 g/dL   Albumin 3.7 3.5 - 5.2 g/dL   AST 45 (H) 0 - 37 U/L   ALT 81 (H) 0 - 35 U/L   Alkaline Phosphatase 72 39 - 117 U/L   Total Bilirubin 0.6 0.3 - 1.2 mg/dL   GFR calc non Af Amer 36 (L) >90 mL/min   GFR calc Af Amer 42 (L) >90 mL/min    Comment: (NOTE) The eGFR has been calculated using the CKD EPI equation. This calculation has not been validated in all clinical situations. eGFR's persistently <90 mL/min signify possible Chronic Kidney Disease.    Anion gap 7 5 - 15    ABGS  Recent Labs  11/06/14 2312  PHART 7.306*  PO2ART 91.8  TCO2 16.5  HCO3 17.8*   CULTURES No results found for this or any previous visit (from the past 240 hour(s)). Studies/Results: Dg Chest 2 View (if Patient Has Fever And/or Copd)  11/06/2014   CLINICAL DATA:  Dyspnea cough and wheezing for 5 days.  EXAM: CHEST  2 VIEW  COMPARISON:  07/01/2014.  FINDINGS: There is moderate hyperinflation, unchanged. The lungs are clear. The pulmonary vasculature is normal. There are no pleural effusions. Hilar, mediastinal and cardiac contours appear  unremarkable. There is no significant interval change.  IMPRESSION: Hyperinflation.  No acute cardiopulmonary findings.   Electronically Signed   By: Andreas Newport M.D.   On: 11/06/2014 20:47    Medications:  Prior to Admission:  Prescriptions prior to admission  Medication Sig Dispense Refill Last Dose  .  acetaminophen (TYLENOL) 500 MG tablet Take 500-1,000 mg by mouth as needed for mild pain.    11/06/2014  . albuterol (PROVENTIL) (2.5 MG/3ML) 0.083% nebulizer solution Take 2.5 mg by nebulization every 4 (four) hours as needed for wheezing or shortness of breath.    11/06/2014  . amLODipine (NORVASC) 5 MG tablet Take 1 tablet (5 mg total) by mouth daily. 30 tablet 1 11/06/2014  . cetirizine (ZYRTEC) 10 MG tablet Take 10 mg by mouth daily.   11/06/2014  . famotidine (PEPCID) 20 MG tablet One at bedtime (Patient taking differently: Take 20 mg by mouth at bedtime. ) 30 tablet 2 Past Week  . flintstones complete (FLINTSTONES) 60 MG chewable tablet Chew 3 tablets by mouth daily.   11/05/2014  . Fluticasone Furoate-Vilanterol 200-25 MCG/INH AEPB Inhale 1 puff into the lungs daily.   11/06/2014  . furosemide (LASIX) 20 MG tablet Take 2 tabs (63m) every other day alternating with 1 tab (258m every other day 45 tablet 6 11/01/2014  . gabapentin (NEURONTIN) 300 MG capsule Take 300 mg by mouth at bedtime as needed (Restless Legs).    Past Week  . levothyroxine (SYNTHROID, LEVOTHROID) 137 MCG tablet Take 137 mcg by mouth daily before breakfast.   11/06/2014  . losartan (COZAAR) 100 MG tablet Take 100 mg by mouth daily.   11/06/2014  . omeprazole (PRILOSEC) 20 MG capsule Take 1 capsule (20 mg total) by mouth daily. 90 capsule 3 11/06/2014  . potassium chloride SA (K-DUR,KLOR-CON) 20 MEQ tablet Take 1 tablet (20 mEq total) by mouth daily. 30 tablet 6 11/01/2014  . sulfamethoxazole-trimethoprim (BACTRIM DS,SEPTRA DS) 800-160 MG per tablet Take 1 tablet by mouth 2 (two) times daily.  1 11/06/2014  . VOLTAREN 1 % GEL Apply 1 application topically daily as needed.  0 Past Week   Scheduled: . amLODipine  5 mg Oral Daily  . doxycycline  100 mg Oral Q12H  . famotidine  20 mg Oral QHS  . furosemide  40 mg Oral Daily  . heparin  5,000 Units Subcutaneous 3 times per day  . ipratropium-albuterol  3 mL Nebulization Q4H  .  levothyroxine  137 mcg Oral QAC breakfast  . methylPREDNISolone (SOLU-MEDROL) injection  60 mg Intravenous Q6H  . pantoprazole  40 mg Oral Daily   Continuous: . sodium chloride 125 mL/hr at 11/06/14 2215   PRWPY:[id number]*OR** acetaminophen, gabapentin, ondansetron **OR** ondansetron (ZOFRAN) IV, oxyCODONE, polyethylene glycol  Assesment: She was admitted with COPD exacerbation. She was hyperkalemic and had some element of acute kidney injury. Her potassium has been treated. Has chronic diastolic heart failure and her dose of Lasix had been adjusted by her cardiologist . Principal Problem:   COPD exacerbation Active Problems:   GERD (gastroesophageal reflux disease)   HTN (hypertension)   Neuropathic pain   Hypothyroidism   Chronic diastolic CHF (congestive heart failure)   Hyperkalemia    Plan: Continue current treatments. She is on antibiotics and steroids inhaled bronchodilators etc. She has improved but she is not ready to go home    LOS: 1 day   Jushua Waltman L 11/07/2014, 10:05 AM

## 2014-11-08 NOTE — Progress Notes (Signed)
Informed by CCMD that patient was tachycardic. Assessed pt who just completed ambulating in room. Pt's B/P was 178/63 and Pulse 120. Pt denied any nausea, vomiting . Pt denied pain or discomfort. Pt in no acute distress at this time.  Will continue to monitor patient.

## 2014-11-08 NOTE — Care Management Utilization Note (Signed)
UR completed 

## 2014-11-08 NOTE — Care Management Note (Addendum)
    Page 1 of 2   11/12/2014     3:11:54 PM CARE MANAGEMENT NOTE 11/12/2014  Patient:  Yolanda [name],Yolanda [name]   Account Number:  [id number]  Date Initiated:  11/08/2014  Documentation initiated by:  Anibal HendersonBOLDEN,GENEVA  Subjective/Objective Assessment:   Admitted with COPD, and has CHF also. Pt is from home, is independent and plans to return home at D/C. Does not have O2 at home, so will need O2 sats checked for possiblity of O2 at home     Action/Plan:   This is pt's 1st admit, and no ED visits in 6 months, so is probably not a candidate for COPD Gold. pt is not home bound, so [facility] unless goes home with [facility]2   Anticipated DC Date:  11/09/2014   Anticipated DC Plan:  HOME/SELF CARE      DC Planning Services  CM consult      [facility]   Choice offered to / List presented to:  C-1 Patient        HH arranged  HH-1 RN      [facility]   Status of service:  Completed, signed off Medicare Important Message given?  YES (If response is "NO", the following Medicare IM given date fields will be blank) Date Medicare IM given:  11/12/2014 Medicare IM given by:  Kathyrn SheriffHILDRESS,JESSICA Date Additional Medicare IM given:   Additional Medicare IM given by:    Discharge Disposition:  HOME [name]  Per UR Regulation:  Reviewed for med. necessity/level of care/duration of stay  If discussed at Long Length of Stay Meetings, dates discussed:   11/11/2014    Comments:  11/12/2014 1500 Kathyrn SheriffJessica Childress, RN, MSN, CM Pt anticipates discharge over weekend. Alroy BailiffLinda Lothian notified of referral and will obtain pt information. Instuctions left to [facility] and [facility] at discharge. No further CM needs. 11/11/2014 1400 Kathyrn SheriffJessica Childress, RN, MSN, CM Pt says MD mentioned having a RN made visit at [facility]. If needed pt requests we [facility]. Discharge anticipated over weekend. Will cont to follow 11/10/2014 1500 Kathyrn SheriffJessica  Childress, RN, MSN, CM Pt started on Elequis this admission, benefits check performed. No pre-cert required and co-pay will be $40. 11/09/2014 1400 Kathyrn SheriffJessica Childress, RN, MSN, CM Pt transfered to ICU for dilt. gtt. 11/08/14 1555 Anibal HendersonGeneva Bolden RN/CM

## 2014-11-08 NOTE — Progress Notes (Signed)
Subjective: She feels better but not well. She has no other new complaints. She still coughing and still feels weak especially when she moves around  Objective: Vital signs in last 24 hours: Temp:  [98 F (36.7 C)-98.3 F (36.8 C)] 98.3 F (36.8 C) (02/15 0526) Pulse Rate:  [96-105] 105 (02/15 0526) Resp:  [20] 20 (02/15 0526) BP: (122-156)/(44-56) 156/54 mmHg (02/15 0526) SpO2:  [94 %-99 %] 94 % (02/15 0709) Weight change:  Last BM Date: 11/07/14  Intake/Output from previous day: 02/14 0701 - 02/15 0700 In: 1605 [P.O.:480; I.V.:1125] Out: -   PHYSICAL EXAM General appearance: alert, cooperative and no distress Resp: rhonchi bilaterally Cardio: regular rate and rhythm, S1, S2 normal, no murmur, click, rub or gallop GI: soft, non-tender; bowel sounds normal; no masses,  no organomegaly Extremities: extremities normal, atraumatic, no cyanosis or edema  Lab Results:  Results for orders placed or performed during the hospital encounter of 11/06/14 (from the past 48 hour(s))  Brain natriuretic peptide     Status: None   Collection Time: 11/06/14  7:58 PM  Result Value Ref Range   B Natriuretic Peptide 18.0 0.0 - 100.0 pg/mL  Basic metabolic panel    (if pt has PMH of COPD)     Status: Abnormal   Collection Time: 11/06/14  8:06 PM  Result Value Ref Range   Sodium 133 (L) 135 - 145 mmol/L   Potassium 6.3 (HH) 3.5 - 5.1 mmol/L    Comment: REPEATED TO VERIFY CRITICAL RESULT CALLED TO, READ BACK BY AND VERIFIED WITH: HILTON,R AT 8:52PM ON 11/06/14 BY MOSLEYJ    Chloride 110 96 - 112 mmol/L   CO2 20 19 - 32 mmol/L   Glucose, Bld 187 (H) 70 - 99 mg/dL   BUN 36 (H) 6 - 23 mg/dL   Creatinine, Ser 1.39 (H) 0.50 - 1.10 mg/dL   Calcium 9.2 8.4 - 10.5 mg/dL   GFR calc non Af Amer 37 (L) >90 mL/min   GFR calc Af Amer 43 (L) >90 mL/min    Comment: (NOTE) The eGFR has been calculated using the CKD EPI equation. This calculation has not been validated in all clinical  situations. eGFR's persistently <90 mL/min signify possible Chronic Kidney Disease.    Anion gap 3 (L) 5 - 15  CBC     (if pt has PMH of COPD)     Status: Abnormal   Collection Time: 11/06/14  8:06 PM  Result Value Ref Range   WBC 9.6 4.0 - 10.5 K/uL   RBC 3.79 (L) 3.87 - 5.11 MIL/uL   Hemoglobin 11.5 (L) 12.0 - 15.0 g/dL   HCT 34.9 (L) 36.0 - 46.0 %   MCV 92.1 78.0 - 100.0 fL   MCH 30.3 26.0 - 34.0 pg   MCHC 33.0 30.0 - 36.0 g/dL   RDW 14.0 11.5 - 15.5 %   Platelets 244 150 - 400 K/uL  Troponin I (if patient has history of COPD)     Status: None   Collection Time: 11/06/14  8:06 PM  Result Value Ref Range   Troponin I <0.03 <0.031 ng/mL    Comment:        NO INDICATION OF MYOCARDIAL INJURY.   I-stat chem 8, ed     Status: Abnormal   Collection Time: 11/06/14  9:00 PM  Result Value Ref Range   Sodium 135 135 - 145 mmol/L   Potassium 6.3 (HH) 3.5 - 5.1 mmol/L   Chloride 111 96 - 112  mmol/L   BUN 33 (H) 6 - 23 mg/dL   Creatinine, Ser 1.50 (H) 0.50 - 1.10 mg/dL   Glucose, Bld 189 (H) 70 - 99 mg/dL   Calcium, Ion 1.29 1.13 - 1.30 mmol/L   TCO2 17 0 - 100 mmol/L   Hemoglobin 11.9 (L) 12.0 - 15.0 g/dL   HCT 35.0 (L) 36.0 - 46.0 %  Blood gas, arterial     Status: Abnormal   Collection Time: 11/06/14 11:12 PM  Result Value Ref Range   O2 Content 2.0 L/min   Delivery systems NASAL CANNULA    pH, Arterial 7.306 (L) 7.350 - 7.450   pCO2 arterial 36.8 35.0 - 45.0 mmHg   pO2, Arterial 91.8 80.0 - 100.0 mmHg   Bicarbonate 17.8 (L) 20.0 - 24.0 mEq/L   TCO2 16.5 0 - 100 mmol/L   Acid-base deficit 7.3 (H) 0.0 - 2.0 mmol/L   O2 Saturation 95.6 %   Patient temperature 37.0    Collection site RADIAL    Drawn by [id number]    Sample type ARTERIAL    Allens test (pass/fail) PASS PASS  CBC     Status: Abnormal   Collection Time: 11/07/14  5:17 AM  Result Value Ref Range   WBC 9.4 4.0 - 10.5 K/uL   RBC 3.45 (L) 3.87 - 5.11 MIL/uL   Hemoglobin 10.2 (L) 12.0 - 15.0 g/dL   HCT 31.9 (L)  36.0 - 46.0 %   MCV 92.5 78.0 - 100.0 fL   MCH 29.6 26.0 - 34.0 pg   MCHC 32.0 30.0 - 36.0 g/dL   RDW 14.0 11.5 - 15.5 %   Platelets 223 150 - 400 K/uL  Comprehensive metabolic panel     Status: Abnormal   Collection Time: 11/07/14  5:17 AM  Result Value Ref Range   Sodium 138 135 - 145 mmol/L   Potassium 5.3 (H) 3.5 - 5.1 mmol/L   Chloride 112 96 - 112 mmol/L   CO2 19 19 - 32 mmol/L   Glucose, Bld 194 (H) 70 - 99 mg/dL   BUN 31 (H) 6 - 23 mg/dL   Creatinine, Ser 1.42 (H) 0.50 - 1.10 mg/dL   Calcium 8.8 8.4 - 10.5 mg/dL   Total Protein 6.9 6.0 - 8.3 g/dL   Albumin 3.7 3.5 - 5.2 g/dL   AST 45 (H) 0 - 37 U/L   ALT 81 (H) 0 - 35 U/L   Alkaline Phosphatase 72 39 - 117 U/L   Total Bilirubin 0.6 0.3 - 1.2 mg/dL   GFR calc non Af Amer 36 (L) >90 mL/min   GFR calc Af Amer 42 (L) >90 mL/min    Comment: (NOTE) The eGFR has been calculated using the CKD EPI equation. This calculation has not been validated in all clinical situations. eGFR's persistently <90 mL/min signify possible Chronic Kidney Disease.    Anion gap 7 5 - 15  Brain natriuretic peptide     Status: None   Collection Time: 11/07/14  5:17 AM  Result Value Ref Range   B Natriuretic Peptide 34.0 0.0 - 100.0 pg/mL    Comment: Performed at [facility]  11/06/14 2312  PHART 7.306*  PO2ART 91.8  TCO2 16.5  HCO3 17.8*   CULTURES No results found for this or any previous visit (from the past 240 hour(s)). Studies/Results: Dg Chest 2 View (if Patient Has Fever And/or Copd)  11/06/2014   CLINICAL DATA:  Dyspnea cough  and wheezing for 5 days.  EXAM: CHEST  2 VIEW  COMPARISON:  07/01/2014.  FINDINGS: There is moderate hyperinflation, unchanged. The lungs are clear. The pulmonary vasculature is normal. There are no pleural effusions. Hilar, mediastinal and cardiac contours appear unremarkable. There is no significant interval change.  IMPRESSION: Hyperinflation.  No acute  cardiopulmonary findings.   Electronically Signed   By: Andreas Newport M.D.   On: 11/06/2014 20:47    Medications:  Prior to Admission:  Prescriptions prior to admission  Medication Sig Dispense Refill Last Dose  . acetaminophen (TYLENOL) 500 MG tablet Take 500-1,000 mg by mouth as needed for mild pain.    11/06/2014  . albuterol (PROVENTIL) (2.5 MG/3ML) 0.083% nebulizer solution Take 2.5 mg by nebulization every 4 (four) hours as needed for wheezing or shortness of breath.    11/06/2014  . amLODipine (NORVASC) 5 MG tablet Take 1 tablet (5 mg total) by mouth daily. 30 tablet 1 11/06/2014  . cetirizine (ZYRTEC) 10 MG tablet Take 10 mg by mouth daily.   11/06/2014  . famotidine (PEPCID) 20 MG tablet One at bedtime (Patient taking differently: Take 20 mg by mouth at bedtime. ) 30 tablet 2 Past Week  . flintstones complete (FLINTSTONES) 60 MG chewable tablet Chew 3 tablets by mouth daily.   11/05/2014  . Fluticasone Furoate-Vilanterol 200-25 MCG/INH AEPB Inhale 1 puff into the lungs daily.   11/06/2014  . furosemide (LASIX) 20 MG tablet Take 2 tabs ($Remov'40mg'iQgWxt$ ) every other day alternating with 1 tab ($Remo'20mg'Vzzwl$ ) every other day 45 tablet 6 11/01/2014  . gabapentin (NEURONTIN) 300 MG capsule Take 300 mg by mouth at bedtime as needed (Restless Legs).    Past Week  . levothyroxine (SYNTHROID, LEVOTHROID) 137 MCG tablet Take 137 mcg by mouth daily before breakfast.   11/06/2014  . losartan (COZAAR) 100 MG tablet Take 100 mg by mouth daily.   11/06/2014  . omeprazole (PRILOSEC) 20 MG capsule Take 1 capsule (20 mg total) by mouth daily. 90 capsule 3 11/06/2014  . potassium chloride SA (K-DUR,KLOR-CON) 20 MEQ tablet Take 1 tablet (20 mEq total) by mouth daily. 30 tablet 6 11/01/2014  . sulfamethoxazole-trimethoprim (BACTRIM DS,SEPTRA DS) 800-160 MG per tablet Take 1 tablet by mouth 2 (two) times daily.  1 11/06/2014  . VOLTAREN 1 % GEL Apply 1 application topically daily as needed.  0 Past Week   Scheduled: . amLODipine  5 mg  Oral Daily  . doxycycline  100 mg Oral Q12H  . famotidine  20 mg Oral QHS  . furosemide  40 mg Oral Daily  . heparin  5,000 Units Subcutaneous 3 times per day  . ipratropium-albuterol  3 mL Nebulization Q4H  . levothyroxine  137 mcg Oral QAC breakfast  . methylPREDNISolone (SOLU-MEDROL) injection  60 mg Intravenous Q6H  . pantoprazole  40 mg Oral Daily   Continuous: . sodium chloride 10 mL/hr at 11/08/14 0930   HBZ:[id number] **OR** acetaminophen, gabapentin, ondansetron **OR** ondansetron (ZOFRAN) IV, oxyCODONE, polyethylene glycol  Assesment: She was admitted with COPD exacerbation. She does not have pneumonia. She has chronic diastolic CHF but that does not seem to be an active part of her problem now. She is improving but is not ready for discharge yet Principal Problem:   COPD exacerbation Active Problems:   GERD (gastroesophageal reflux disease)   HTN (hypertension)   Neuropathic pain   Hypothyroidism   Chronic diastolic CHF (congestive heart failure)   Hyperkalemia    Plan: Continue with current treatments including  antibiotics steroids inhaled bronchodilators.    LOS: 2 days   Eligha Kmetz L 11/08/2014, 9:56 AM

## 2014-11-08 NOTE — Progress Notes (Signed)
Assessed patient and vital signs as follows. Temp 98.1, Pulse 110 Respirations 20, B/P 159/68. Pt was in no acute distress and denied pain or discomfort. Will continue to monitor patient throughout shift.

## 2014-11-09 DIAGNOSIS — I480 Paroxysmal atrial fibrillation: Secondary | ICD-10-CM

## 2014-11-09 DIAGNOSIS — I5032 Chronic diastolic (congestive) heart failure: Secondary | ICD-10-CM

## 2014-11-09 DIAGNOSIS — J441 Chronic obstructive pulmonary disease with (acute) exacerbation: Principal | ICD-10-CM

## 2014-11-09 LAB — TROPONIN I
Troponin I: <0.03 ng/mL (ref <0.031)
Troponin I: <0.03 ng/mL (ref <0.031)

## 2014-11-09 LAB — BASIC METABOLIC PANEL
Anion gap: 5 (ref 5–15)
BUN: 37 mg/dL — ABNORMAL HIGH (ref 6–23)
CHLORIDE: 114 mmol/L — AB (ref 96–112)
CO2: 21 mmol/L (ref 19–32)
Calcium: 9.4 mg/dL (ref 8.4–10.5)
Creatinine, Ser: 0.96 mg/dL (ref 0.50–1.10)
GFR calc Af Amer: 67 mL/min — ABNORMAL LOW (ref >90)
GFR, EST NON AFRICAN AMERICAN: 58 mL/min — AB (ref >90)
GLUCOSE: 171 mg/dL — AB (ref 70–99)
POTASSIUM: 4.6 mmol/L (ref 3.5–5.1)
SODIUM: 140 mmol/L (ref 135–145)

## 2014-11-09 LAB — MRSA PCR SCREENING: MRSA BY PCR: NEGATIVE

## 2014-11-09 LAB — MAGNESIUM: Magnesium: 2 mg/dL (ref 1.5–2.5)

## 2014-11-09 MED ORDER — LEVALBUTEROL HCL 0.63 MG/3ML IN NEBU
0.6300 mg | INHALATION_SOLUTION | RESPIRATORY_TRACT | Status: DC
  Administered 2014-11-09 – 2014-11-13 (×21): 0.63 mg via RESPIRATORY_TRACT
  Filled 2014-11-09 (×22): qty 3

## 2014-11-09 MED ORDER — IPRATROPIUM BROMIDE 0.02 % IN SOLN
0.5000 mg | RESPIRATORY_TRACT | Status: DC
  Administered 2014-11-09 – 2014-11-13 (×21): 0.5 mg via RESPIRATORY_TRACT
  Filled 2014-11-09 (×22): qty 2.5

## 2014-11-09 MED ORDER — APIXABAN 5 MG PO TABS
5.0000 mg | ORAL_TABLET | Freq: Two times a day (BID) | ORAL | Status: DC
  Administered 2014-11-09 – 2014-11-13 (×9): 5 mg via ORAL
  Filled 2014-11-09 (×9): qty 1

## 2014-11-09 MED ORDER — DILTIAZEM HCL 100 MG IV SOLR
5.0000 mg/h | INTRAVENOUS | Status: DC
  Administered 2014-11-09 – 2014-11-10 (×2): 5 mg/h via INTRAVENOUS
  Administered 2014-11-11: 10 mg/h via INTRAVENOUS
  Filled 2014-11-09: qty 100

## 2014-11-09 NOTE — Discharge Instructions (Signed)

## 2014-11-09 NOTE — Consult Note (Signed)
CARDIOLOGY CONSULT NOTE   Patient ID: Yolanda [name] MRN: [id number] DOB/AGE: 04/16/1941 74 y.o.  Admit Date: 11/06/2014 Referring Physician: Kari Baars  MD Primary Physician: Fredirick Maudlin, MD Consulting Cardiologist: Charlton Haws MD Primary Cardiologist: Prentice Docker MD Reason for Consultation: Newe Onset Afib  Clinical Summary [name] is a 74 y.o.female with known history of hypertensive heart disease, normal EF with grade I diastolic dysfunction, COPD, hypothyroidism, GERD who was initially admitted with COPD exacerbation and hypertension. Developed atrial fibrillation this am with elevated HR and diastolic CHF.     She states that symptoms began one week ago with bronchitis symptoms and worsening and cough with some LEE. She states that she felt her HR go up with minimal exertion and weakness, i.e taking a shower. With rest HR got better. She has noticed, however that her HR has stayed up since being in the hospital. She states the swelling is better after lasix. Breathing is some better but with elevated HR breathing is becoming worse.     In ER BP was elevated at [address], HR 107, O2 Sat 95%,. Potassium was elevated at 6.3 Creatinine 1.50. CXR Hyperinflation no pneumonia or CHF. She was treated with duoneb neb tx, lasix and kayexulate  HR increased into the 120's over the last 24 hours. She is now placed on a diltiazem gtt and moved to ICU.  Allergies  Allergen Reactions  . Ciprofloxacin Shortness Of Breath    REACTION: sob,tachycardia  . Fish Oil     Patient face drew to the side,Bells Palsey  . Guaifenesin Shortness Of Breath    REACTION: sob,tachycardia  . Adhesive [Tape] Other (See Comments)    Causes blisters on skin  . Tramadol     insomnia    Medications Scheduled Medications: . amLODipine  5 mg Oral Daily  . doxycycline  100 mg Oral Q12H  . famotidine  20 mg Oral QHS  . furosemide  40 mg Oral Daily  . heparin  5,000 Units Subcutaneous 3 times per  day  . ipratropium-albuterol  3 mL Nebulization Q4H  . levothyroxine  137 mcg Oral QAC breakfast  . methylPREDNISolone (SOLU-MEDROL) injection  60 mg Intravenous Q6H  . pantoprazole  40 mg Oral Daily    Infusions: . sodium chloride 10 mL/hr at 11/08/14 0930  . diltiazem (CARDIZEM) infusion      PRN Medications: acetaminophen **OR** acetaminophen, gabapentin, ondansetron **OR** ondansetron (ZOFRAN) IV, oxyCODONE, polyethylene glycol   Past Medical History  Diagnosis Date  . Thyroid condition   . Hypertension   . GERD (gastroesophageal reflux disease)   . COPD (chronic obstructive pulmonary disease)   . Bronchitis   . Barrett esophagus     Past Surgical History  Procedure Laterality Date  . Cholecystectomy    . Colonoscopy    . Upper gastrointestinal endoscopy    . Right breast biopsy  Sept, 2012    Family History  Problem Relation Age of Onset  . Dementia Mother   . Lung cancer Father     smoked  . Hypertension Sister   . Diabetes Brother   . Obesity Sister   . Hypertension Sister   . Restless legs syndrome Sister   . Healthy Sister     Social History [name] reports that she quit smoking about 15 years ago. Her smoking use included Cigarettes. She has a 15 pack-year smoking history. She has never used smokeless tobacco. [name] reports that she does not drink alcohol.  Review of  Systems Complete review of systems are found to be negative unless outlined in H&P above.  Physical Examination Blood pressure 154/67, pulse 110, temperature 97.9 F (36.6 C), temperature source Oral, resp. rate 20, height  (1.575 m), weight 225 lb (102.059 kg), SpO2 97 %.  Intake/Output Summary (Last 24 hours) at 11/09/14 0957 Last data filed at 11/09/14 0900  Gross per 24 hour  Intake  867.5 ml  Output      0 ml  Net  867.5 ml    Telemetry: Atrial fib with rate of 120-125 bpm.  GEN: Dyspnea at rest. Feels heart racing. HEENT: Conjunctiva and lids normal,  oropharynx clear with moist mucosa. Neck: Supple, no elevated JVP or carotid bruits, no thyromegaly. Lungs: Inspiratory wheezes with bibasilar crackles, wearing O2,  Cardiac: Rapid Irregular rate and rhythm, no S3 or significant systolic murmur, no pericardial rub. Abdomen: Soft, nontender, no hepatomegaly, bowel sounds present, no guarding or rebound. Extremities: No pitting edema, distal pulses 2+. Skin: Warm and dry. Musculoskeletal: No kyphosis. Neuropsychiatric: Alert and oriented x3, affect grossly appropriate.  Prior Cardiac Testing/Procedures 1 Echocardiogram 12/30/2013 Procedure narrative: Transthoracic echocardiography. Image quality was suboptimal. The study was technically difficult, as a result of poor sound wave transmission and body habitus. - Left ventricle: The cavity size was normal. Wall thickness was increased in a pattern of mild LVH. Systolic function was normal. The estimated ejection fraction was in the range of 60% to 65%. Wall motion was normal; there were no regional wall motion abnormalities. There was an increased relative contribution of atrial contraction to ventricular filling. Doppler parameters are consistent with abnormal left ventricular relaxation (grade 1 diastolic dysfunction). Doppler parameters are consistent with high ventricular filling pressure. - Mitral valve: Mildly thickened leaflets . - Pericardium, extracardiac: A trivial pericardial effusion was identified.  Lab Results  Basic Metabolic Panel:  Recent Labs Lab 11/06/14 2006 11/06/14 2100 11/07/14 0517 11/09/14 0820  NA 133* 135 138 140  K 6.3* 6.3* 5.3* 4.6  CL 110 111 112 114*  CO2 20  --  19 21  GLUCOSE 187* 189* 194* 171*  BUN 36* 33* 31* 37*  CREATININE 1.39* 1.50* 1.42* 0.96  CALCIUM 9.2  --  8.8 9.4  MG  --   --   --  2.0    Liver Function Tests:  Recent Labs Lab 11/07/14 0517  AST 45*  ALT 81*  ALKPHOS 72  BILITOT 0.6  PROT 6.9   ALBUMIN 3.7    CBC:  Recent Labs Lab 11/06/14 2006 11/06/14 2100 11/07/14 0517  WBC 9.6  --  9.4  HGB 11.5* 11.9* 10.2*  HCT 34.9* 35.0* 31.9*  MCV 92.1  --  92.5  PLT 244  --  223    Cardiac Enzymes:  Recent Labs Lab 11/06/14 2006 11/09/14 0820  TROPONINI <0.03 <0.03    Radiology: CXR 11/16/2014 There is moderate hyperinflation, unchanged. The lungs are clear. The pulmonary vasculature is normal. There are no pleural effusions. Hilar, mediastinal and cardiac contours appear unremarkable. There is no significant interval change. IMPRESSION: Hyperinflation. No acute cardiopulmonary findings.  ECG: Atrial fibrillation with rate of 114 bpm and nonspecific T-wave abnormalities   Impression and Recommendations  1. New Onset Atrial fib:  Heart rate is in the 120's but has newly been started on diltiazem gtt and moved to ICU. Yet to determine her response. CHADS VASC Score 4. Eliquis 5 mg BID is recommended.  Likely related to COPD exacerbation and steroidal inhalers Would recommend change  to Xopenex as this has less affect on HR. BP stable. Will stop amlodipine with use of diltiazem   2. Hypertension; BP is somewhat controlled. Will review response on diltiazem gtt for afib. If she responds, would keep her on this and not restart amlodipine. Will recheck echo when HR slows down.   3. Diastolic CHF: Rate related with hypertensive heart disease. She has been diuresed 2.4 cc and now remains on 40 mg daily. Potassium is corrected to 4.6 with diureses and kayexulate.   4. COPD Exacerbation: Treatment per Dr. Juanetta GoslingHawkins.     Signed: Bettey MareKathryn M. Lawrence NP [facility]  11/09/2014, 9:57 AM Co-Sign MD  Patient examined chart reviewed  Obese white female with COPD exacerbation Less wheezy today on steroids New onset PAF  Normal Cr  CHADVASC 4  No contraindications to anticoagulation Agree with cardizem for  Rate control and eliquis for anticoagulations  [facility]arl [facility]holderKoneswaren [facility]  Cardiac exam  Benign.  Obese with exp wheezing   Charlton HawsPeter Leane Loring

## 2014-11-09 NOTE — Progress Notes (Signed)
E-link notified of Dr.Fanta advising that they handle pt's case at this time and that a repeat EKG done with no significant changes from previous results. Will continue to monitor.

## 2014-11-09 NOTE — Progress Notes (Signed)
Subjective: She developed atrial fibrillation with rapid ventricular response. She says she can feel her heart beating fast.  Objective: Vital signs in last 24 hours: Temp:  [97.9 F (36.6 C)-98.2 F (36.8 C)] 97.9 F (36.6 C) (02/16 0641) Pulse Rate:  [101-120] 110 (02/16 0641) Resp:  [20] 20 (02/16 0641) BP: (138-178)/(58-67) 154/67 mmHg (02/16 0641) SpO2:  [95 %-97 %] 97 % (02/16 0830) Weight change:  Last BM Date: 11/09/14  Intake/Output from previous day: 02/15 0701 - 02/16 0700 In: 507.5 [I.V.:507.5] Out: -   PHYSICAL EXAM General appearance: alert, cooperative and mild distress Resp: rhonchi bilaterally Cardio: irregularly irregular rhythm GI: soft, non-tender; bowel sounds normal; no masses,  no organomegaly Extremities: extremities normal, atraumatic, no cyanosis or edema  Lab Results:  Results for orders placed or performed during the hospital encounter of 11/06/14 (from the past 48 hour(s))  Basic metabolic panel     Status: Abnormal   Collection Time: 11/09/14  8:20 AM  Result Value Ref Range   Sodium 140 135 - 145 mmol/L   Potassium 4.6 3.5 - 5.1 mmol/L   Chloride 114 (H) 96 - 112 mmol/L   CO2 21 19 - 32 mmol/L   Glucose, Bld 171 (H) 70 - 99 mg/dL   BUN 37 (H) 6 - 23 mg/dL   Creatinine, Ser 0.96 0.50 - 1.10 mg/dL   Calcium 9.4 8.4 - 10.5 mg/dL   GFR calc non Af Amer 58 (L) >90 mL/min   GFR calc Af Amer 67 (L) >90 mL/min    Comment: (NOTE) The eGFR has been calculated using the CKD EPI equation. This calculation has not been validated in all clinical situations. eGFR's persistently <90 mL/min signify possible Chronic Kidney Disease.    Anion gap 5 5 - 15  Magnesium     Status: None   Collection Time: 11/09/14  8:20 AM  Result Value Ref Range   Magnesium 2.0 1.5 - 2.5 mg/dL  Troponin I (q 6hr x 3)     Status: None   Collection Time: 11/09/14  8:20 AM  Result Value Ref Range   Troponin I <0.03 <0.031 ng/mL    Comment:        NO INDICATION  OF MYOCARDIAL INJURY.     ABGS  Recent Labs  11/06/14 2312  PHART 7.306*  PO2ART 91.8  TCO2 16.5  HCO3 17.8*   CULTURES No results found for this or any previous visit (from the past 240 hour(s)). Studies/Results: No results found.  Medications:  Prior to Admission:  Prescriptions prior to admission  Medication Sig Dispense Refill Last Dose  . acetaminophen (TYLENOL) 500 MG tablet Take 500-1,000 mg by mouth as needed for mild pain.    11/06/2014  . albuterol (PROVENTIL) (2.5 MG/3ML) 0.083% nebulizer solution Take 2.5 mg by nebulization every 4 (four) hours as needed for wheezing or shortness of breath.    11/06/2014  . amLODipine (NORVASC) 5 MG tablet Take 1 tablet (5 mg total) by mouth daily. 30 tablet 1 11/06/2014  . cetirizine (ZYRTEC) 10 MG tablet Take 10 mg by mouth daily.   11/06/2014  . famotidine (PEPCID) 20 MG tablet One at bedtime (Patient taking differently: Take 20 mg by mouth at bedtime. ) 30 tablet 2 Past Week  . flintstones complete (FLINTSTONES) 60 MG chewable tablet Chew 3 tablets by mouth daily.   11/05/2014  . Fluticasone Furoate-Vilanterol 200-25 MCG/INH AEPB Inhale 1 puff into the lungs daily.   11/06/2014  . furosemide (LASIX) 20 MG  tablet Take 2 tabs ($Remov'40mg'pKLnwJ$ ) every other day alternating with 1 tab ($Remo'20mg'YomHc$ ) every other day 45 tablet 6 11/01/2014  . gabapentin (NEURONTIN) 300 MG capsule Take 300 mg by mouth at bedtime as needed (Restless Legs).    Past Week  . levothyroxine (SYNTHROID, LEVOTHROID) 137 MCG tablet Take 137 mcg by mouth daily before breakfast.   11/06/2014  . losartan (COZAAR) 100 MG tablet Take 100 mg by mouth daily.   11/06/2014  . omeprazole (PRILOSEC) 20 MG capsule Take 1 capsule (20 mg total) by mouth daily. 90 capsule 3 11/06/2014  . potassium chloride SA (K-DUR,KLOR-CON) 20 MEQ tablet Take 1 tablet (20 mEq total) by mouth daily. 30 tablet 6 11/01/2014  . sulfamethoxazole-trimethoprim (BACTRIM DS,SEPTRA DS) 800-160 MG per tablet Take 1 tablet by mouth 2  (two) times daily.  1 11/06/2014  . VOLTAREN 1 % GEL Apply 1 application topically daily as needed.  0 Past Week   Scheduled: . amLODipine  5 mg Oral Daily  . doxycycline  100 mg Oral Q12H  . famotidine  20 mg Oral QHS  . furosemide  40 mg Oral Daily  . heparin  5,000 Units Subcutaneous 3 times per day  . ipratropium-albuterol  3 mL Nebulization Q4H  . levothyroxine  137 mcg Oral QAC breakfast  . methylPREDNISolone (SOLU-MEDROL) injection  60 mg Intravenous Q6H  . pantoprazole  40 mg Oral Daily   Continuous: . sodium chloride 10 mL/hr at 11/08/14 0930  . diltiazem (CARDIZEM) infusion     WAQ:[id number] **OR** acetaminophen, gabapentin, ondansetron **OR** ondansetron (ZOFRAN) IV, oxyCODONE, polyethylene glycol  Assesment: She is admitted with COPD exacerbation. She has now developed atrial fibrillation with rapid ventricular response. She is going to be transferred to stepdown have laboratory work and start on diltiazem and will have cardiology consultation Principal Problem:   COPD exacerbation Active Problems:   GERD (gastroesophageal reflux disease)   HTN (hypertension)   Neuropathic pain   Hypothyroidism   Chronic diastolic CHF (congestive heart failure)   Hyperkalemia    Plan: Continue treatment for COPD she is improving otherwise as above    LOS: 3 days   Molli Gethers L 11/09/2014, 9:05 AM

## 2014-11-09 NOTE — Progress Notes (Signed)
I was notified by telemetry about a change in the patient's rhythm from Sinus tach to Afib. Patient lowest HR has been 110 with the highest being 148 as it continues to fluctuate. Vitals signs are stable with a BP of 154/67. The patient continues to be asymptomatic. I have paged Dr. Juanetta GoslingHawkins and he has ordered a stat EKG and states that he will assess the situation when he arrives this morning.

## 2014-11-09 NOTE — Progress Notes (Signed)
Report called and given to ICU nurse. Morning medications given to pt. Pt alert and oriented. Pt transferred to ICU Stepdown. Taken down on 2L O2. Tele discontinued. IV saline-locked.

## 2014-11-09 NOTE — Progress Notes (Addendum)
Patient has a resting HR around 100, but when she gets up to walk or use the bathroom her HR jumps up into the 140's. The HR goes back down once she is settled back in the bed. Patient has been asymptomatic stating that she feels fine. I will continue to monitor.

## 2014-11-09 NOTE — Progress Notes (Signed)
Noticed a rhythm change on heart monitor. QRS seemed wider than previous EKG. Dr. Felecia ShellingFanta called. MD states to allow E-Link to handle case and he agreed to a repeat EKG. EKG redone with no indication of change from previous results.

## 2014-11-10 DIAGNOSIS — I5033 Acute on chronic diastolic (congestive) heart failure: Secondary | ICD-10-CM

## 2014-11-10 DIAGNOSIS — N179 Acute kidney failure, unspecified: Secondary | ICD-10-CM

## 2014-11-10 DIAGNOSIS — R739 Hyperglycemia, unspecified: Secondary | ICD-10-CM

## 2014-11-10 DIAGNOSIS — E039 Hypothyroidism, unspecified: Secondary | ICD-10-CM

## 2014-11-10 DIAGNOSIS — I4891 Unspecified atrial fibrillation: Secondary | ICD-10-CM

## 2014-11-10 DIAGNOSIS — K219 Gastro-esophageal reflux disease without esophagitis: Secondary | ICD-10-CM

## 2014-11-10 DIAGNOSIS — E875 Hyperkalemia: Secondary | ICD-10-CM

## 2014-11-10 DIAGNOSIS — I10 Essential (primary) hypertension: Secondary | ICD-10-CM

## 2014-11-10 DIAGNOSIS — N189 Chronic kidney disease, unspecified: Secondary | ICD-10-CM

## 2014-11-10 MED ORDER — DILTIAZEM HCL 30 MG PO TABS
30.0000 mg | ORAL_TABLET | Freq: Four times a day (QID) | ORAL | Status: DC
  Administered 2014-11-10 – 2014-11-11 (×3): 30 mg via ORAL
  Filled 2014-11-10 (×3): qty 1

## 2014-11-10 NOTE — Progress Notes (Signed)
Patient: Yolanda MurrainBrenda W [name] / Admit Date: 11/06/2014 / Date of Encounter: 11/10/2014, 12:37 PM   Subjective: Feeling better but still has continued dyspnea particularly with minimal exertion in the room.  Objective: Telemetry: atrial fib rates 110-120s Physical Exam: Blood pressure 130/57, pulse 104, temperature 97.9 F (36.6 C), temperature source Oral, resp. rate 17, height 5\' 2"  (1.575 m), weight 230 lb 6.1 oz (104.5 kg), SpO2 94 %. General: Well developed, well nourished WF in no acute distress. Head: Normocephalic, atraumatic, sclera non-icteric, no xanthomas, nares are without discharge. Neck:  JVP not elevated. Lungs: Coarse and diffusely rhonchorous throughout. Speaks in 4-5 word sentences. Heart: Irregularly irregular S1 S2 without murmurs, rubs, or gallops.  Abdomen: Soft, non-tender, non-distended with normoactive bowel sounds. No rebound/guarding. Extremities: No clubbing or cyanosis. Trace pedal edema.  Neuro: Alert and oriented X 3. Moves all extremities spontaneously. Psych:  Responds to questions appropriately with a normal affect.   Intake/Output Summary (Last 24 hours) at 11/10/14 1237 Last data filed at 11/10/14 1119  Gross per 24 hour  Intake    240 ml  Output   1125 ml  Net   -885 ml    Inpatient Medications:  . apixaban  5 mg Oral BID  . doxycycline  100 mg Oral Q12H  . famotidine  20 mg Oral QHS  . furosemide  40 mg Oral Daily  . ipratropium  0.5 mg Nebulization Q4H  . levalbuterol  0.63 mg Nebulization Q4H  . levothyroxine  137 mcg Oral QAC breakfast  . methylPREDNISolone (SOLU-MEDROL) injection  60 mg Intravenous Q6H  . pantoprazole  40 mg Oral Daily   Infusions:  . sodium chloride 10 mL/hr at 11/08/14 0930  . diltiazem (CARDIZEM) infusion 5 mg/hr (11/10/14 1119)    Labs:  Recent Labs  11/09/14 0820  NA 140  K 4.6  CL 114*  CO2 21  GLUCOSE 171*  BUN 37*  CREATININE 0.96  CALCIUM 9.4  MG 2.0   No results for input(s): AST, ALT, ALKPHOS,  BILITOT, PROT, ALBUMIN in the last 72 hours. No results for input(s): WBC, NEUTROABS, HGB, HCT, MCV, PLT in the last 72 hours.  Recent Labs  11/09/14 0820 11/09/14 1410 11/09/14 2031  TROPONINI <0.03 <0.03 <0.03   Invalid input(s): POCBNP No results for input(s): HGBA1C in the last 72 hours.   Radiology/Studies:  Dg Chest 2 View (if Patient Has Fever And/or Copd)  11/06/2014   CLINICAL DATA:  Dyspnea cough and wheezing for 5 days.  EXAM: CHEST  2 VIEW  COMPARISON:  07/01/2014.  FINDINGS: There is moderate hyperinflation, unchanged. The lungs are clear. The pulmonary vasculature is normal. There are no pleural effusions. Hilar, mediastinal and cardiac contours appear unremarkable. There is no significant interval change.  IMPRESSION: Hyperinflation.  No acute cardiopulmonary findings.   Electronically Signed   By: Ellery Plunkaniel R Mitchell M.D.   On: 11/06/2014 20:47   Dg Wrist Complete Right  10/25/2014   CLINICAL DATA:  Larey SeatFell yesterday in church in parking lot while trying to catch her sister who was falling, pain and swelling RIGHT wrist, tight feeling, initial encounter  EXAM: RIGHT WRIST - COMPLETE 3+ VIEW  COMPARISON:  None  FINDINGS: Mild osseous demineralization.  Joint spaces preserved.  No acute fracture, dislocation or bone destruction.  IMPRESSION: No acute osseous abnormalities.   Electronically Signed   By: Ulyses SouthwardMark  Boles M.D.   On: 10/25/2014 14:58     Assessment and Plan  1. AECOPD 2. AKI on CKD stage  III with hyperkalemia, improved  3. New onset atrial fibrillation with RVR 11/09/14 4. Acute on chronic diastolic CHF, rate related with hypertensive heart disease 5. Essential HTN 6. Hyperglycemia without prior h/o DM - ? due to steroids  Rate remains elevated in the setting of COPD exacerbation right now, was 90's this AM, currently 110-120. Being treated with nebs, steroids, abx. Will continue diltiazem drip for now - orders are in to titrate to goal HR <105. If improved in AM, would  change to oral form. With COPD, will not add BB at this time. Consider repeating echocardiogram when HR slower - last performed 12/2013. Continue Lasix while she has elevated rates but continue to follow renal function given ? BUN - note normal BNP on admission. Continue Eliquis. Tentatively it appears the plan is for possible outpatient DCCV if still in AF once anticoagulated for 3 weeks, giving her time to improve from a resp status.  Signed, Ronie Spies PA-C  The patient was seen and examined, and I agree with the assessment and plan as documented above, with modifications as noted below. Pt well known to me from clinic with aforementioned history. When I evaluated her in January, she was just recovering from a COPD exacerbation. She feels better today but her breathing is still labored with minimal exertion.  On diltiazem drip 10 mg/hr with HR still 110-120 range. I will start oral diltiazem concomitantly at 30 mg q 6 hours to see if infusion can be discontinued in the near future. She is on Eliquis for new-onset atrial fibrillation. I agree with consideration for outpatient direct current cardioversion once her pulmonary status has stabilized and she is fully anticoagulated.

## 2014-11-10 NOTE — Progress Notes (Signed)
Subjective: She says she is much more comfortable. She is still aware of palpitations. Her breathing is doing better  Objective: Vital signs in last 24 hours: Temp:  [97.9 F (36.6 C)-98.6 F (37 C)] 97.9 F (36.6 C) (02/17 0738) Pulse Rate:  [67-108] 91 (02/17 0600) Resp:  [11-25] 17 (02/17 0600) BP: (111-148)/(42-121) 129/61 mmHg (02/17 0600) SpO2:  [93 %-99 %] 99 % (02/17 0600) Weight:  [104.5 kg (230 lb 6.1 oz)] 104.5 kg (230 lb 6.1 oz) (02/17 0500) Weight change:  Last BM Date: 11/09/14  Intake/Output from previous day: 02/16 0701 - 02/17 0700 In: 360 [P.O.:360] Out: 450 [Urine:450]  PHYSICAL EXAM General appearance: alert, cooperative and mild distress Resp: rhonchi bilaterally Cardio: irregularly irregular rhythm GI: soft, non-tender; bowel sounds normal; no masses,  no organomegaly Extremities: Trace  Lab Results:  Results for orders placed or performed during the hospital encounter of 11/06/14 (from the past 48 hour(s))  Basic metabolic panel     Status: Abnormal   Collection Time: 11/09/14  8:20 AM  Result Value Ref Range   Sodium 140 135 - 145 mmol/L   Potassium 4.6 3.5 - 5.1 mmol/L   Chloride 114 (H) 96 - 112 mmol/L   CO2 21 19 - 32 mmol/L   Glucose, Bld 171 (H) 70 - 99 mg/dL   BUN 37 (H) 6 - 23 mg/dL   Creatinine, Ser 7.00 0.50 - 1.10 mg/dL   Calcium 9.4 8.4 - 04.7 mg/dL   GFR calc non Af Amer 58 (L) >90 mL/min   GFR calc Af Amer 67 (L) >90 mL/min    Comment: (NOTE) The eGFR has been calculated using the CKD EPI equation. This calculation has not been validated in all clinical situations. eGFR's persistently <90 mL/min signify possible Chronic Kidney Disease.    Anion gap 5 5 - 15  Magnesium     Status: None   Collection Time: 11/09/14  8:20 AM  Result Value Ref Range   Magnesium 2.0 1.5 - 2.5 mg/dL  Troponin I (q 6hr x 3)     Status: None   Collection Time: 11/09/14  8:20 AM  Result Value Ref Range   Troponin I <0.03 <0.031 ng/mL    Comment:         NO INDICATION OF MYOCARDIAL INJURY.   MRSA PCR Screening     Status: None   Collection Time: 11/09/14 11:10 AM  Result Value Ref Range   MRSA by PCR NEGATIVE NEGATIVE    Comment:        The GeneXpert MRSA Assay (FDA approved for NASAL specimens only), is one component of a comprehensive MRSA colonization surveillance program. It is not intended to diagnose MRSA infection nor to guide or monitor treatment for MRSA infections.   Troponin I (q 6hr x 3)     Status: None   Collection Time: 11/09/14  2:10 PM  Result Value Ref Range   Troponin I <0.03 <0.031 ng/mL    Comment:        NO INDICATION OF MYOCARDIAL INJURY.   Troponin I (q 6hr x 3)     Status: None   Collection Time: 11/09/14  8:31 PM  Result Value Ref Range   Troponin I <0.03 <0.031 ng/mL    Comment:        NO INDICATION OF MYOCARDIAL INJURY.     ABGS No results for input(s): PHART, PO2ART, TCO2, HCO3 in the last 72 hours.  Invalid input(s): PCO2 CULTURES Recent Results (from the  past 240 hour(s))  MRSA PCR Screening     Status: None   Collection Time: 11/09/14 11:10 AM  Result Value Ref Range Status   MRSA by PCR NEGATIVE NEGATIVE Final    Comment:        The GeneXpert MRSA Assay (FDA approved for NASAL specimens only), is one component of a comprehensive MRSA colonization surveillance program. It is not intended to diagnose MRSA infection nor to guide or monitor treatment for MRSA infections.    Studies/Results: No results found.  Medications:  Prior to Admission:  Prescriptions prior to admission  Medication Sig Dispense Refill Last Dose  . acetaminophen (TYLENOL) 500 MG tablet Take 500-1,000 mg by mouth as needed for mild pain.    11/06/2014  . albuterol (PROVENTIL) (2.5 MG/3ML) 0.083% nebulizer solution Take 2.5 mg by nebulization every 4 (four) hours as needed for wheezing or shortness of breath.    11/06/2014  . amLODipine (NORVASC) 5 MG tablet Take 1 tablet (5 mg total) by mouth  daily. 30 tablet 1 11/06/2014  . cetirizine (ZYRTEC) 10 MG tablet Take 10 mg by mouth daily.   11/06/2014  . famotidine (PEPCID) 20 MG tablet One at bedtime (Patient taking differently: Take 20 mg by mouth at bedtime. ) 30 tablet 2 Past Week  . flintstones complete (FLINTSTONES) 60 MG chewable tablet Chew 3 tablets by mouth daily.   11/05/2014  . Fluticasone Furoate-Vilanterol 200-25 MCG/INH AEPB Inhale 1 puff into the lungs daily.   11/06/2014  . furosemide (LASIX) 20 MG tablet Take 2 tabs ($Remov'40mg'UKyzoA$ ) every other day alternating with 1 tab ($Remo'20mg'RmGce$ ) every other day 45 tablet 6 11/01/2014  . gabapentin (NEURONTIN) 300 MG capsule Take 300 mg by mouth at bedtime as needed (Restless Legs).    Past Week  . levothyroxine (SYNTHROID, LEVOTHROID) 137 MCG tablet Take 137 mcg by mouth daily before breakfast.   11/06/2014  . losartan (COZAAR) 100 MG tablet Take 100 mg by mouth daily.   11/06/2014  . omeprazole (PRILOSEC) 20 MG capsule Take 1 capsule (20 mg total) by mouth daily. 90 capsule 3 11/06/2014  . potassium chloride SA (K-DUR,KLOR-CON) 20 MEQ tablet Take 1 tablet (20 mEq total) by mouth daily. 30 tablet 6 11/01/2014  . sulfamethoxazole-trimethoprim (BACTRIM DS,SEPTRA DS) 800-160 MG per tablet Take 1 tablet by mouth 2 (two) times daily.  1 11/06/2014  . VOLTAREN 1 % GEL Apply 1 application topically daily as needed.  0 Past Week   Scheduled: . apixaban  5 mg Oral BID  . doxycycline  100 mg Oral Q12H  . famotidine  20 mg Oral QHS  . furosemide  40 mg Oral Daily  . ipratropium  0.5 mg Nebulization Q4H  . levalbuterol  0.63 mg Nebulization Q4H  . levothyroxine  137 mcg Oral QAC breakfast  . methylPREDNISolone (SOLU-MEDROL) injection  60 mg Intravenous Q6H  . pantoprazole  40 mg Oral Daily   Continuous: . sodium chloride 10 mL/hr at 11/08/14 0930  . diltiazem (CARDIZEM) infusion 5 mg/hr (11/09/14 1005)   TIR:[id number] **OR** acetaminophen, gabapentin, ondansetron **OR** ondansetron (ZOFRAN) IV, oxyCODONE,  polyethylene glycol  Assesment: She was admitted with COPD exacerbation. She has improved but it has been slowly. She developed paroxysmal atrial fibrillation yesterday and is now in stepdown on diltiazem drip, on Eliquis, and her heart rate is about 90. Principal Problem:   COPD exacerbation Active Problems:   GERD (gastroesophageal reflux disease)   HTN (hypertension)   Neuropathic pain   Hypothyroidism   Chronic  diastolic CHF (congestive heart failure)   Hyperkalemia   PAF (paroxysmal atrial fibrillation)    Plan: Continue antibiotics, steroids, diltiazem. Cardiology input is noted and appreciated.    LOS: 4 days   Yolanda [name] L 11/10/2014, 8:26 AM

## 2014-11-10 NOTE — Progress Notes (Signed)
Pt is much more comfortable at this time. No labored breathing at this time. Pt has no complaints of pain.

## 2014-11-10 NOTE — Progress Notes (Signed)
[facility] and [facility]. Pt short of breath with exertion. Respiratory called.

## 2014-11-11 LAB — CBC WITH DIFFERENTIAL/PLATELET
Basophils Absolute: 0 10*3/uL (ref 0.0–0.1)
Basophils Relative: 0 % (ref 0–1)
EOS ABS: 0 10*3/uL (ref 0.0–0.7)
EOS PCT: 0 % (ref 0–5)
HCT: 34 % — ABNORMAL LOW (ref 36.0–46.0)
Hemoglobin: 10.9 g/dL — ABNORMAL LOW (ref 12.0–15.0)
Lymphocytes Relative: 6 % — ABNORMAL LOW (ref 12–46)
Lymphs Abs: 0.6 10*3/uL — ABNORMAL LOW (ref 0.7–4.0)
MCH: 29.7 pg (ref 26.0–34.0)
MCHC: 32.1 g/dL (ref 30.0–36.0)
MCV: 92.6 fL (ref 78.0–100.0)
MONOS PCT: 3 % (ref 3–12)
Monocytes Absolute: 0.3 10*3/uL (ref 0.1–1.0)
Neutro Abs: 8.9 10*3/uL — ABNORMAL HIGH (ref 1.7–7.7)
Neutrophils Relative %: 91 % — ABNORMAL HIGH (ref 43–77)
PLATELETS: 222 10*3/uL (ref 150–400)
RBC: 3.67 MIL/uL — ABNORMAL LOW (ref 3.87–5.11)
RDW: 14 % (ref 11.5–15.5)
WBC: 9.7 10*3/uL (ref 4.0–10.5)

## 2014-11-11 LAB — BASIC METABOLIC PANEL
Anion gap: 7 (ref 5–15)
BUN: 49 mg/dL — ABNORMAL HIGH (ref 6–23)
CALCIUM: 9.5 mg/dL (ref 8.4–10.5)
CO2: 24 mmol/L (ref 19–32)
Chloride: 110 mmol/L (ref 96–112)
Creatinine, Ser: 1.09 mg/dL (ref 0.50–1.10)
GFR calc Af Amer: 58 mL/min — ABNORMAL LOW (ref >90)
GFR calc non Af Amer: 50 mL/min — ABNORMAL LOW (ref >90)
GLUCOSE: 192 mg/dL — AB (ref 70–99)
Potassium: 4 mmol/L (ref 3.5–5.1)
Sodium: 141 mmol/L (ref 135–145)

## 2014-11-11 MED ORDER — DILTIAZEM HCL 60 MG PO TABS
60.0000 mg | ORAL_TABLET | Freq: Four times a day (QID) | ORAL | Status: DC
  Administered 2014-11-11 – 2014-11-12 (×4): 60 mg via ORAL
  Filled 2014-11-11 (×4): qty 1

## 2014-11-11 MED ORDER — SALINE SPRAY 0.65 % NA SOLN
1.0000 | NASAL | Status: DC | PRN
  Administered 2014-11-11: 1 via NASAL
  Filled 2014-11-11: qty 44

## 2014-11-11 NOTE — Progress Notes (Signed)
Consulting cardiologist: Prentice DockerKoneswaran, Nikkita Adeyemi MD Primary Cardiologist: Prentice DockerKoneswaran, Tamasha Laplante MD  Cardiology Specific Problem List: 1.New Onset Atrial Fib 2. Hypertension:  3. Diastolic CHF  Subjective:    Still having issues breathing with coughing and DOE.   Objective:   Temp:  [97.8 F (36.6 C)-98.6 F (37 C)] 98 F (36.7 C) (02/18 0400) Pulse Rate:  [31-128] 128 (02/18 0900) Resp:  [13-27] 18 (02/18 0900) BP: (119-169)/(45-104) 162/104 mmHg (02/18 0900) SpO2:  [90 %-99 %] 95 % (02/18 0900) Weight:  [229 lb 4.5 oz (104 kg)] 229 lb 4.5 oz (104 kg) (02/18 0500) Last BM Date: 11/09/14  Filed Weights   11/06/14 1932 11/10/14 0500 11/11/14 0500  Weight: 225 lb (102.059 kg) 230 lb 6.1 oz (104.5 kg) 229 lb 4.5 oz (104 kg)    Intake/Output Summary (Last 24 hours) at 11/11/14 1015 Last data filed at 11/11/14 0900  Gross per 24 hour  Intake    920 ml  Output   1445 ml  Net   -525 ml    Telemetry: Atrial fib with rates in the 80-100's.   Exam:  General: No acute distress.  HEENT: Conjunctiva and lids normal, oropharynx clear.  Lungs: Inspiratory and expiratory wheezes with bibasilar crackles.  Frequent coughing and easily dyspneac with minimal movement in the bed. Using pursued lip breathing.   Cardiac: No elevated JVP or bruits. IRRR, no gallop or rub.   Abdomen: Normoactive bowel sounds, nontender, nondistended.  Extremities: No pitting edema, distal pulses full. Legs are sore with palpation for edema.   Neuropsychiatric: Alert and oriented x3, affect appropriate.   Lab Results:  Basic Metabolic Panel:  Recent Labs Lab 11/07/14 0517 11/09/14 0820 11/11/14 0454  NA 138 140 141  K 5.3* 4.6 4.0  CL 112 114* 110  CO2 19 21 24   GLUCOSE 194* 171* 192*  BUN 31* 37* 49*  CREATININE 1.42* 0.96 1.09  CALCIUM 8.8 9.4 9.5  MG  --  2.0  --     Liver Function Tests:  Recent Labs Lab 11/07/14 0517  AST 45*  ALT 81*  ALKPHOS 72  BILITOT 0.6  PROT 6.9    ALBUMIN 3.7    CBC:  Recent Labs Lab 11/06/14 2006 11/06/14 2100 11/07/14 0517 11/11/14 0454  WBC 9.6  --  9.4 9.7  HGB 11.5* 11.9* 10.2* 10.9*  HCT 34.9* 35.0* 31.9* 34.0*  MCV 92.1  --  92.5 92.6  PLT 244  --  223 222    Cardiac Enzymes:  Recent Labs Lab 11/09/14 0820 11/09/14 1410 11/09/14 2031  TROPONINI <0.03 <0.03 <0.03     Medications:   Scheduled Medications: . apixaban  5 mg Oral BID  . diltiazem  30 mg Oral 4 times per day  . doxycycline  100 mg Oral Q12H  . famotidine  20 mg Oral QHS  . furosemide  40 mg Oral Daily  . ipratropium  0.5 mg Nebulization Q4H  . levalbuterol  0.63 mg Nebulization Q4H  . levothyroxine  137 mcg Oral QAC breakfast  . methylPREDNISolone (SOLU-MEDROL) injection  60 mg Intravenous Q6H  . pantoprazole  40 mg Oral Daily    Infusions: . sodium chloride 10 mL/hr at 11/08/14 0930  . diltiazem (CARDIZEM) infusion 10 mg/hr (11/11/14 0900)    PRN Medications: acetaminophen **OR** acetaminophen, gabapentin, ondansetron **OR** ondansetron (ZOFRAN) IV, oxyCODONE, polyethylene glycol, sodium chloride   Assessment and Plan:   1. Atrial fib with RVR: Has been changed from diltiazem gtt to po  dilttazem 30 mg Q6. BP remains elevated along with HR. Will increase to 60 mg Q6 hrs and evaluate her response. She is on steroids and inhalers which may be contributing to HR elevation. She will continue on anticoagulation and be considered for DCCV after 3 weeks on NOAC. Echo when HR is better.  2. Hypertension: Increase dose of CCB to assist with better control.   3. COPD exacerbation: Continues DOE, minimal exertion, just moving around in the bed. No evidence of CHF on exam. Would like to see her move out of unit and ambulate. Deconditioning may be contributing to her overall status. Consider PT while in the hospital, and OP pulmonary rehab.   Bettey Mare. Lawrence NP [facility]  11/11/2014, 10:15 AM   The patient was seen and examined, and I agree  with the assessment and plan as documented above, with modifications as noted below. Pt well known to me from clinic with aforementioned history. When I evaluated her in January, she was just recovering from a COPD exacerbation. She feels about the same as she did yesterday and her breathing is still labored with minimal exertion.  On diltiazem drip 5 mg/hr with HR in 90-110 bpm range. Drip will be discontinued. I will increase oral diltiazem to 60 mg q 6 hours. BP starting to normalize now, 130/70 during my exam. She is on Eliquis for new-onset atrial fibrillation. I agree with consideration for outpatient direct current cardioversion once her pulmonary status has stabilized and she is fully anticoagulated.

## 2014-11-11 NOTE — Progress Notes (Signed)
Inpatient Diabetes Program Recommendations  AACE/ADA: New Consensus Statement on Inpatient Glycemic Control (2013)  Target Ranges:  Prepandial:   less than 140 mg/dL      Peak postprandial:   less than 180 mg/dL (1-2 hours)      Critically ill patients:  140 - 180 mg/dL   Results for Yolanda [name], [name] (MRN [id number]) as of 11/11/2014 11:20  Ref. Range 11/07/2014 05:17 11/09/2014 08:20 11/11/2014 04:54  Glucose Latest Range: 70-99 mg/dL 409194 (H) 811171 (H) 914192 (H)   Diabetes history: NO Outpatient Diabetes medications: NA Current orders for Inpatient glycemic control: None  Inpatient Diabetes Program Recommendations Correction (SSI): Lab glucose 192 mg/dl this morning. While inpatient and ordered steroids, may want to consider ordering CBGs with Novolog correction scale. HgbA1C: May want to consider ordering an A1C to evaluate glycemic control over the past 2-3 months.  Note: Patient does not have a documented history of diabetes. Patient is ordered Solumedrol 60 mg Q6 hours which is likely cause of noted hyperglycemia.   Thanks, Orlando PennerMarie Suesan Mohrmann, RN, MSN, CCRN, CDE Diabetes Coordinator Inpatient Diabetes Program [phone] (Team Pager) [phone] ([facility] office) [phone] [facility] office)

## 2014-11-11 NOTE — Progress Notes (Signed)
Subjective: She says she feels better. She has less shortness of breath when she gets up and moves now. She is still coughing and producing sputum. She can still feel her heart fluttering. She says she has dryness of her nose that she relates to nasal oxygen  Objective: Vital signs in last 24 hours: Temp:  [97.8 F (36.6 C)-98.6 F (37 C)] 98 F (36.7 C) (02/18 0400) Pulse Rate:  [83-125] 111 (02/18 0500) Resp:  [13-27] 23 (02/18 0500) BP: (83-169)/(45-82) 148/66 mmHg (02/18 0500) SpO2:  [91 %-99 %] 98 % (02/18 0734) FiO2 (%):  [28 %] 28 % (02/17 0800) Weight:  [104 kg (229 lb 4.5 oz)] 104 kg (229 lb 4.5 oz) (02/18 0500) Weight change: -0.5 kg (-1 lb 1.6 oz) Last BM Date: 11/09/14  Intake/Output from previous day: 02/17 0701 - 02/18 0700 In: 920 [P.O.:920] Out: 1845 [Urine:1845]  PHYSICAL EXAM General appearance: alert, cooperative and no distress Resp: rhonchi bilaterally Cardio: irregularly irregular rhythm GI: soft, non-tender; bowel sounds normal; no masses,  no organomegaly Extremities: She has trace to 1+ edema  Lab Results:  Results for orders placed or performed during the hospital encounter of 11/06/14 (from the past 48 hour(s))  Basic metabolic panel     Status: Abnormal   Collection Time: 11/09/14  8:20 AM  Result Value Ref Range   Sodium 140 135 - 145 mmol/L   Potassium 4.6 3.5 - 5.1 mmol/L   Chloride 114 (H) 96 - 112 mmol/L   CO2 21 19 - 32 mmol/L   Glucose, Bld 171 (H) 70 - 99 mg/dL   BUN 37 (H) 6 - 23 mg/dL   Creatinine, Ser 0.96 0.50 - 1.10 mg/dL   Calcium 9.4 8.4 - 10.5 mg/dL   GFR calc non Af Amer 58 (L) >90 mL/min   GFR calc Af Amer 67 (L) >90 mL/min    Comment: (NOTE) The eGFR has been calculated using the CKD EPI equation. This calculation has not been validated in all clinical situations. eGFR's persistently <90 mL/min signify possible Chronic Kidney Disease.    Anion gap 5 5 - 15  Magnesium     Status: None   Collection Time: 11/09/14  8:20  AM  Result Value Ref Range   Magnesium 2.0 1.5 - 2.5 mg/dL  Troponin I (q 6hr x 3)     Status: None   Collection Time: 11/09/14  8:20 AM  Result Value Ref Range   Troponin I <0.03 <0.031 ng/mL    Comment:        NO INDICATION OF MYOCARDIAL INJURY.   MRSA PCR Screening     Status: None   Collection Time: 11/09/14 11:10 AM  Result Value Ref Range   MRSA by PCR NEGATIVE NEGATIVE    Comment:        The GeneXpert MRSA Assay (FDA approved for NASAL specimens only), is one component of a comprehensive MRSA colonization surveillance program. It is not intended to diagnose MRSA infection nor to guide or monitor treatment for MRSA infections.   Troponin I (q 6hr x 3)     Status: None   Collection Time: 11/09/14  2:10 PM  Result Value Ref Range   Troponin I <0.03 <0.031 ng/mL    Comment:        NO INDICATION OF MYOCARDIAL INJURY.   Troponin I (q 6hr x 3)     Status: None   Collection Time: 11/09/14  8:31 PM  Result Value Ref Range   Troponin  I <0.03 <0.031 ng/mL    Comment:        NO INDICATION OF MYOCARDIAL INJURY.   Basic metabolic panel     Status: Abnormal   Collection Time: 11/11/14  4:54 AM  Result Value Ref Range   Sodium 141 135 - 145 mmol/L   Potassium 4.0 3.5 - 5.1 mmol/L   Chloride 110 96 - 112 mmol/L   CO2 24 19 - 32 mmol/L   Glucose, Bld 192 (H) 70 - 99 mg/dL   BUN 49 (H) 6 - 23 mg/dL   Creatinine, Ser 1.09 0.50 - 1.10 mg/dL   Calcium 9.5 8.4 - 10.5 mg/dL   GFR calc non Af Amer 50 (L) >90 mL/min   GFR calc Af Amer 58 (L) >90 mL/min    Comment: (NOTE) The eGFR has been calculated using the CKD EPI equation. This calculation has not been validated in all clinical situations. eGFR's persistently <90 mL/min signify possible Chronic Kidney Disease.    Anion gap 7 5 - 15  CBC with Differential/Platelet     Status: Abnormal   Collection Time: 11/11/14  4:54 AM  Result Value Ref Range   WBC 9.7 4.0 - 10.5 K/uL   RBC 3.67 (L) 3.87 - 5.11 MIL/uL   Hemoglobin  10.9 (L) 12.0 - 15.0 g/dL   HCT 34.0 (L) 36.0 - 46.0 %   MCV 92.6 78.0 - 100.0 fL   MCH 29.7 26.0 - 34.0 pg   MCHC 32.1 30.0 - 36.0 g/dL   RDW 14.0 11.5 - 15.5 %   Platelets 222 150 - 400 K/uL   Neutrophils Relative % 91 (H) 43 - 77 %   Neutro Abs 8.9 (H) 1.7 - 7.7 K/uL   Lymphocytes Relative 6 (L) 12 - 46 %   Lymphs Abs 0.6 (L) 0.7 - 4.0 K/uL   Monocytes Relative 3 3 - 12 %   Monocytes Absolute 0.3 0.1 - 1.0 K/uL   Eosinophils Relative 0 0 - 5 %   Eosinophils Absolute 0.0 0.0 - 0.7 K/uL   Basophils Relative 0 0 - 1 %   Basophils Absolute 0.0 0.0 - 0.1 K/uL    ABGS No results for input(s): PHART, PO2ART, TCO2, HCO3 in the last 72 hours.  Invalid input(s): PCO2 CULTURES Recent Results (from the past 240 hour(s))  MRSA PCR Screening     Status: None   Collection Time: 11/09/14 11:10 AM  Result Value Ref Range Status   MRSA by PCR NEGATIVE NEGATIVE Final    Comment:        The GeneXpert MRSA Assay (FDA approved for NASAL specimens only), is one component of a comprehensive MRSA colonization surveillance program. It is not intended to diagnose MRSA infection nor to guide or monitor treatment for MRSA infections.    Studies/Results: No results found.  Medications:  Prior to Admission:  Prescriptions prior to admission  Medication Sig Dispense Refill Last Dose  . acetaminophen (TYLENOL) 500 MG tablet Take 500-1,000 mg by mouth as needed for mild pain.    11/06/2014  . albuterol (PROVENTIL) (2.5 MG/3ML) 0.083% nebulizer solution Take 2.5 mg by nebulization every 4 (four) hours as needed for wheezing or shortness of breath.    11/06/2014  . amLODipine (NORVASC) 5 MG tablet Take 1 tablet (5 mg total) by mouth daily. 30 tablet 1 11/06/2014  . cetirizine (ZYRTEC) 10 MG tablet Take 10 mg by mouth daily.   11/06/2014  . famotidine (PEPCID) 20 MG tablet One at bedtime (  Patient taking differently: Take 20 mg by mouth at bedtime. ) 30 tablet 2 Past Week  . flintstones complete  (FLINTSTONES) 60 MG chewable tablet Chew 3 tablets by mouth daily.   11/05/2014  . Fluticasone Furoate-Vilanterol 200-25 MCG/INH AEPB Inhale 1 puff into the lungs daily.   11/06/2014  . furosemide (LASIX) 20 MG tablet Take 2 tabs ($Remov'40mg'VxIJgM$ ) every other day alternating with 1 tab ($Remo'20mg'RbHLl$ ) every other day 45 tablet 6 11/01/2014  . gabapentin (NEURONTIN) 300 MG capsule Take 300 mg by mouth at bedtime as needed (Restless Legs).    Past Week  . levothyroxine (SYNTHROID, LEVOTHROID) 137 MCG tablet Take 137 mcg by mouth daily before breakfast.   11/06/2014  . losartan (COZAAR) 100 MG tablet Take 100 mg by mouth daily.   11/06/2014  . omeprazole (PRILOSEC) 20 MG capsule Take 1 capsule (20 mg total) by mouth daily. 90 capsule 3 11/06/2014  . potassium chloride SA (K-DUR,KLOR-CON) 20 MEQ tablet Take 1 tablet (20 mEq total) by mouth daily. 30 tablet 6 11/01/2014  . sulfamethoxazole-trimethoprim (BACTRIM DS,SEPTRA DS) 800-160 MG per tablet Take 1 tablet by mouth 2 (two) times daily.  1 11/06/2014  . VOLTAREN 1 % GEL Apply 1 application topically daily as needed.  0 Past Week   Scheduled: . apixaban  5 mg Oral BID  . diltiazem  30 mg Oral 4 times per day  . doxycycline  100 mg Oral Q12H  . famotidine  20 mg Oral QHS  . furosemide  40 mg Oral Daily  . ipratropium  0.5 mg Nebulization Q4H  . levalbuterol  0.63 mg Nebulization Q4H  . levothyroxine  137 mcg Oral QAC breakfast  . methylPREDNISolone (SOLU-MEDROL) injection  60 mg Intravenous Q6H  . pantoprazole  40 mg Oral Daily   Continuous: . sodium chloride 10 mL/hr at 11/08/14 0930  . diltiazem (CARDIZEM) infusion 10 mg/hr (11/11/14 0032)   [id number] **OR** acetaminophen, gabapentin, ondansetron **OR** ondansetron (ZOFRAN) IV, oxyCODONE, polyethylene glycol, sodium chloride  Assesment: She was admitted with COPD exacerbation. She has improved as far as that's concerned. She developed paroxysmal atrial fibrillation with rapid ventricular response and that's  being treated with diltiazem and has improved. She may be able to be switched to totally oral diltiazem today. She has chronic diastolic heart failure and that's been treated since her heart rate is up. She is overall improving. Principal Problem:   COPD exacerbation Active Problems:   GERD (gastroesophageal reflux disease)   HTN (hypertension)   Neuropathic pain   Hypothyroidism   Chronic diastolic CHF (congestive heart failure)   Hyperkalemia   PAF (paroxysmal atrial fibrillation)    Plan: Continue current treatments. Cardiology help noted and appreciated. Continue diltiazem and probably switch to totally oral diltiazem today. She might be able to move out of the unit if her heart rate stays okay    LOS: 5 days   Jaxsyn Azam L 11/11/2014, 7:50 AM

## 2014-11-12 MED ORDER — LOSARTAN POTASSIUM 50 MG PO TABS
100.0000 mg | ORAL_TABLET | Freq: Every day | ORAL | Status: DC
  Administered 2014-11-12 – 2014-11-13 (×2): 100 mg via ORAL
  Filled 2014-11-12 (×2): qty 2

## 2014-11-12 MED ORDER — PREDNISONE 20 MG PO TABS
40.0000 mg | ORAL_TABLET | Freq: Every day | ORAL | Status: DC
  Administered 2014-11-12 – 2014-11-13 (×2): 40 mg via ORAL
  Filled 2014-11-12 (×2): qty 2

## 2014-11-12 MED ORDER — DILTIAZEM HCL 60 MG PO TABS
90.0000 mg | ORAL_TABLET | Freq: Four times a day (QID) | ORAL | Status: DC
  Administered 2014-11-12 – 2014-11-13 (×4): 90 mg via ORAL
  Filled 2014-11-12 (×8): qty 1

## 2014-11-12 MED ORDER — FLUTICASONE FUROATE-VILANTEROL 200-25 MCG/INH IN AEPB: 1.0000 | INHALATION_SPRAY | Freq: Every day | RESPIRATORY_TRACT | Status: DC

## 2014-11-12 MED ORDER — MOMETASONE FURO-FORMOTEROL FUM 100-5 MCG/ACT IN AERO
2.0000 | INHALATION_SPRAY | Freq: Two times a day (BID) | RESPIRATORY_TRACT | Status: DC
  Administered 2014-11-12 – 2014-11-13 (×2): 2 via RESPIRATORY_TRACT
  Filled 2014-11-12: qty 8.8

## 2014-11-12 MED ORDER — HYDROGEN PEROXIDE 3 % EX SOLN
CUTANEOUS | Status: AC
  Filled 2014-11-12: qty 473

## 2014-11-12 NOTE — Progress Notes (Signed)
Called report to ParadiseMichel, RN on dept 300. Verbalized understanding.  Pt transferred to room 312 in safe and stable condition. Schonewitz, Candelaria StagersLeigh Anne 11/12/2014

## 2014-11-12 NOTE — Progress Notes (Signed)
Subjective: She says she feels better. She still has some congestion. She can still feel her heart is irregular.  Objective: Vital signs in last 24 hours: Temp:  [97.6 F (36.4 C)-98 F (36.7 C)] 98 F (36.7 C) (02/19 0400) Pulse Rate:  [83-128] 99 (02/19 0500) Resp:  [12-33] 21 (02/19 0400) BP: (117-168)/(48-114) 153/65 mmHg (02/19 0500) SpO2:  [92 %-98 %] 95 % (02/19 0727) Weight:  [104.2 kg (229 lb 11.5 oz)] 104.2 kg (229 lb 11.5 oz) (02/19 0500) Weight change: 0.2 kg (7.1 oz) Last BM Date: 11/09/14  Intake/Output from previous day: 02/18 0701 - 02/19 0700 In: 1200 [P.O.:1200] Out: 950 [Urine:950]  PHYSICAL EXAM General appearance: alert, cooperative and mild distress Resp: rhonchi bilaterally Cardio: irregularly irregular rhythm GI: soft, non-tender; bowel sounds normal; no masses,  no organomegaly Extremities: extremities normal, atraumatic, no cyanosis or edema  Lab Results:  Results for orders placed or performed during the hospital encounter of 11/06/14 (from the past 48 hour(s))  Basic metabolic panel     Status: Abnormal   Collection Time: 11/11/14  4:54 AM  Result Value Ref Range   Sodium 141 135 - 145 mmol/[name]   Potassium 4.0 3.5 - 5.1 mmol/[name]   Chloride 110 96 - 112 mmol/[name]   CO2 24 19 - 32 mmol/[name]   Glucose, Bld 192 (H) 70 - 99 mg/dL   BUN 49 (H) 6 - 23 mg/dL   Creatinine, Ser 1.09 0.50 - 1.10 mg/dL   Calcium 9.5 8.4 - 10.5 mg/dL   GFR calc non Af Amer 50 ([name]) >90 mL/min   GFR calc Af Amer 58 ([name]) >90 mL/min    Comment: (NOTE) The eGFR has been calculated using the CKD EPI equation. This calculation has not been validated in all clinical situations. eGFR's persistently <90 mL/min signify possible Chronic Kidney Disease.    Anion gap 7 5 - 15  CBC with Differential/Platelet     Status: Abnormal   Collection Time: 11/11/14  4:54 AM  Result Value Ref Range   WBC 9.7 4.0 - 10.5 K/uL   RBC 3.67 ([name]) 3.87 - 5.11 MIL/uL   Hemoglobin 10.9 ([name]) 12.0 - 15.0 g/dL   HCT 34.0 ([name]) 36.0 - 46.0 %   MCV 92.6 78.0 - 100.0 fL   MCH 29.7 26.0 - 34.0 pg   MCHC 32.1 30.0 - 36.0 g/dL   RDW 14.0 11.5 - 15.5 %   Platelets 222 150 - 400 K/uL   Neutrophils Relative % 91 (H) 43 - 77 %   Neutro Abs 8.9 (H) 1.7 - 7.7 K/uL   Lymphocytes Relative 6 ([name]) 12 - 46 %   Lymphs Abs 0.6 ([name]) 0.7 - 4.0 K/uL   Monocytes Relative 3 3 - 12 %   Monocytes Absolute 0.3 0.1 - 1.0 K/uL   Eosinophils Relative 0 0 - 5 %   Eosinophils Absolute 0.0 0.0 - 0.7 K/uL   Basophils Relative 0 0 - 1 %   Basophils Absolute 0.0 0.0 - 0.1 K/uL    ABGS No results for input(s): PHART, PO2ART, TCO2, HCO3 in the last 72 hours.  Invalid input(s): PCO2 CULTURES Recent Results (from the past 240 hour(s))  MRSA PCR Screening     Status: None   Collection Time: 11/09/14 11:10 AM  Result Value Ref Range Status   MRSA by PCR NEGATIVE NEGATIVE Final    Comment:        The GeneXpert MRSA Assay (FDA approved for NASAL specimens only), is one  component of a comprehensive MRSA colonization surveillance program. It is not intended to diagnose MRSA infection nor to guide or monitor treatment for MRSA infections.    Studies/Results: No results found.  Medications:  Prior to Admission:  Prescriptions prior to admission  Medication Sig Dispense Refill Last Dose  . acetaminophen (TYLENOL) 500 MG tablet Take 500-1,000 mg by mouth as needed for mild pain.    11/06/2014  . albuterol (PROVENTIL) (2.5 MG/3ML) 0.083% nebulizer solution Take 2.5 mg by nebulization every 4 (four) hours as needed for wheezing or shortness of breath.    11/06/2014  . amLODipine (NORVASC) 5 MG tablet Take 1 tablet (5 mg total) by mouth daily. 30 tablet 1 11/06/2014  . cetirizine (ZYRTEC) 10 MG tablet Take 10 mg by mouth daily.   11/06/2014  . famotidine (PEPCID) 20 MG tablet One at bedtime (Patient taking differently: Take 20 mg by mouth at bedtime. ) 30 tablet 2 Past Week  . flintstones complete (FLINTSTONES) 60 MG chewable tablet  Chew 3 tablets by mouth daily.   11/05/2014  . Fluticasone Furoate-Vilanterol 200-25 MCG/INH AEPB Inhale 1 puff into the lungs daily.   11/06/2014  . furosemide (LASIX) 20 MG tablet Take 2 tabs ($Remov'40mg'CmtHXl$ ) every other day alternating with 1 tab ($Remo'20mg'RQfjk$ ) every other day 45 tablet 6 11/01/2014  . gabapentin (NEURONTIN) 300 MG capsule Take 300 mg by mouth at bedtime as needed (Restless Legs).    Past Week  . levothyroxine (SYNTHROID, LEVOTHROID) 137 MCG tablet Take 137 mcg by mouth daily before breakfast.   11/06/2014  . losartan (COZAAR) 100 MG tablet Take 100 mg by mouth daily.   11/06/2014  . omeprazole (PRILOSEC) 20 MG capsule Take 1 capsule (20 mg total) by mouth daily. 90 capsule 3 11/06/2014  . potassium chloride SA (K-DUR,KLOR-CON) 20 MEQ tablet Take 1 tablet (20 mEq total) by mouth daily. 30 tablet 6 11/01/2014  . sulfamethoxazole-trimethoprim (BACTRIM DS,SEPTRA DS) 800-160 MG per tablet Take 1 tablet by mouth 2 (two) times daily.  1 11/06/2014  . VOLTAREN 1 % GEL Apply 1 application topically daily as needed.  0 Past Week   Scheduled: . apixaban  5 mg Oral BID  . diltiazem  90 mg Oral 4 times per day  . doxycycline  100 mg Oral Q12H  . famotidine  20 mg Oral QHS  . furosemide  40 mg Oral Daily  . ipratropium  0.5 mg Nebulization Q4H  . levalbuterol  0.63 mg Nebulization Q4H  . levothyroxine  137 mcg Oral QAC breakfast  . methylPREDNISolone (SOLU-MEDROL) injection  60 mg Intravenous Q6H  . pantoprazole  40 mg Oral Daily   Continuous: . sodium chloride 10 mL/hr at 11/08/14 0930   HWT:[id number] **OR** acetaminophen, gabapentin, ondansetron **OR** ondansetron (ZOFRAN) IV, oxyCODONE, polyethylene glycol, sodium chloride  Assesment: She was admitted with COPD exacerbation. She has not been able to cough anything up recently. She is improving and feels like she is approaching baseline with her breathing  She developed atrial fibrillation with rapid ventricular response probably related to her COPD.  She is on diltiazem but her heart rate at rest is 100-110 while I been talking to her. She has a history of heart failure and has been receiving Lasix because of her heart rate in the concern that she will develop acute exacerbation of diastolic heart failure related to her heart rate  She's had elevated blood sugars. I think this is related to her steroids but I will have her get a hemoglobin  A1c. She is going to be switched to prednisone today so I'm not going to start her on CBGs yet  Principal Problem:   COPD exacerbation Active Problems:   GERD (gastroesophageal reflux disease)   HTN (hypertension)   Neuropathic pain   Hypothyroidism   Chronic diastolic CHF (congestive heart failure)   Hyperkalemia   PAF (paroxysmal atrial fibrillation)    Plan: Increase her diltiazem to 90 mg every 6 hours. I will get her moved from the ICU. She can be more active. She will switch to prednisone. She is approaching discharge    LOS: 6 days   Yolanda [name] 11/12/2014, 7:39 AM

## 2014-11-12 NOTE — Care Management Utilization Note (Signed)
UR completed 

## 2014-11-12 NOTE — Progress Notes (Signed)
SUBJECTIVE: Pt doing better. Less short of breath, more palpitations while lying down. Wants to bathe.     Intake/Output Summary (Last 24 hours) at 11/12/14 1612 Last data filed at 11/12/14 0900  Gross per 24 hour  Intake    960 ml  Output   1650 ml  Net   -690 ml    Current Facility-Administered Medications  Medication Dose Route Frequency Provider Last Rate Last Dose  . 0.9 %  sodium chloride infusion   Intravenous Continuous Fredirick MaudlinEdward L Hawkins, MD 10 mL/hr at 11/08/14 0930    . acetaminophen (TYLENOL) tablet 650 mg  650 mg Oral Q6H PRN Ozella Rocksavid J Merrell, MD   650 mg at 11/11/14 2059   Or  . acetaminophen (TYLENOL) suppository 650 mg  650 mg Rectal Q6H PRN Ozella Rocksavid J Merrell, MD      . apixaban Everlene Balls(ELIQUIS) tablet 5 mg  5 mg Oral BID Jodelle GrossKathryn M Lawrence, NP   5 mg at 11/12/14 0900  . diltiazem (CARDIZEM) tablet 90 mg  90 mg Oral 4 times per day Fredirick MaudlinEdward L Hawkins, MD   90 mg at 11/12/14 1114  . doxycycline (VIBRA-TABS) tablet 100 mg  100 mg Oral Q12H Ozella Rocksavid J Merrell, MD   100 mg at 11/12/14 0900  . famotidine (PEPCID) tablet 20 mg  20 mg Oral QHS Ozella Rocksavid J Merrell, MD   20 mg at 11/11/14 2059  . furosemide (LASIX) tablet 40 mg  40 mg Oral Daily Ozella Rocksavid J Merrell, MD   40 mg at 11/12/14 0900  . gabapentin (NEURONTIN) capsule 300 mg  300 mg Oral QHS PRN Ozella Rocksavid J Merrell, MD   300 mg at 11/09/14 2118  . ipratropium (ATROVENT) nebulizer solution 0.5 mg  0.5 mg Nebulization Q4H Henderson CloudEstela Y Hernandez Acosta, MD   0.5 mg at 11/12/14 1258  . levalbuterol (XOPENEX) nebulizer solution 0.63 mg  0.63 mg Nebulization Q4H Henderson CloudEstela Y Hernandez Acosta, MD   0.63 mg at 11/12/14 1258  . levothyroxine (SYNTHROID, LEVOTHROID) tablet 137 mcg  137 mcg Oral QAC breakfast Ozella Rocksavid J Merrell, MD   137 mcg at 11/12/14 0749  . losartan (COZAAR) tablet 100 mg  100 mg Oral Daily Fredirick MaudlinEdward L Hawkins, MD   100 mg at 11/12/14 1541  . mometasone-formoterol (DULERA) 100-5 MCG/ACT inhaler 2 puff  2 puff Inhalation BID Fredirick MaudlinEdward L Hawkins,  MD      . ondansetron [facility](ZOFRAN) tablet 4 mg  4 mg Oral Q6H PRN Ozella Rocksavid J Merrell, MD       Or  . ondansetron [facility](ZOFRAN) injection 4 mg  4 mg Intravenous Q6H PRN Ozella Rocksavid J Merrell, MD      . oxyCODONE (Oxy IR/ROXICODONE) immediate release tablet 5 mg  5 mg Oral Q4H PRN Ozella Rocksavid J Merrell, MD   5 mg at 11/09/14 2118  . pantoprazole (PROTONIX) EC tablet 40 mg  40 mg Oral Daily Ozella Rocksavid J Merrell, MD   40 mg at 11/12/14 0900  . polyethylene glycol (MIRALAX / GLYCOLAX) packet 17 g  17 g Oral Daily PRN Ozella Rocksavid J Merrell, MD      . predniSONE (DELTASONE) tablet 40 mg  40 mg Oral Q breakfast Fredirick MaudlinEdward L Hawkins, MD   40 mg at 11/12/14 1541  . sodium chloride (OCEAN) 0.65 % nasal spray 1 spray  1 spray Each Nare PRN Fredirick MaudlinEdward L Hawkins, MD   1 spray at 11/11/14 0835    Filed Vitals:   11/12/14 0900 11/12/14 1000 11/12/14 1100 11/12/14 1301  BP:  156/61    Pulse: 115 101 99   Temp:   98.1 F (36.7 C)   TempSrc:   Oral   Resp: 24     Height:      Weight:      SpO2: 96% 95% 95% 97%    PHYSICAL EXAM General: NAD HEENT: Normal. Neck: No JVD, no thyromegaly.  Lungs: Less pronounced expiratory wheezes posteriorly, more anteriorly. No rales. CV: Irregular rhythm, normal S1/S2, no S3, no murmur.  No pretibial edema.   Abdomen: Soft, obese, no distention.  Neurologic: Alert and oriented x 3.  Psych: Normal affect. Musculoskeletal: No gross deformities. Extremities: No clubbing or cyanosis.    LABS: Basic Metabolic Panel:  Recent Labs  16/10/96 0454  NA 141  K 4.0  CL 110  CO2 24  GLUCOSE 192*  BUN 49*  CREATININE 1.09  CALCIUM 9.5   Liver Function Tests: No results for input(s): AST, ALT, ALKPHOS, BILITOT, PROT, ALBUMIN in the last 72 hours. No results for input(s): LIPASE, AMYLASE in the last 72 hours. CBC:  Recent Labs  11/11/14 0454  WBC 9.7  NEUTROABS 8.9*  HGB 10.9*  HCT 34.0*  MCV 92.6  PLT 222   Cardiac Enzymes:  Recent Labs  11/09/14 2031  TROPONINI <0.03   BNP: Invalid  input(s): POCBNP D-Dimer: No results for input(s): DDIMER in the last 72 hours. Hemoglobin A1C: No results for input(s): HGBA1C in the last 72 hours. Fasting Lipid Panel: No results for input(s): CHOL, HDL, LDLCALC, TRIG, CHOLHDL, LDLDIRECT in the last 72 hours. Thyroid Function Tests: No results for input(s): TSH, T4TOTAL, T3FREE, THYROIDAB in the last 72 hours.  Invalid input(s): FREET3 Anemia Panel: No results for input(s): VITAMINB12, FOLATE, FERRITIN, TIBC, IRON, RETICCTPCT in the last 72 hours.  RADIOLOGY: Dg Chest 2 View (if Patient Has Fever And/or Copd)  11/06/2014   CLINICAL DATA:  Dyspnea cough and wheezing for 5 days.  EXAM: CHEST  2 VIEW  COMPARISON:  07/01/2014.  FINDINGS: There is moderate hyperinflation, unchanged. The lungs are clear. The pulmonary vasculature is normal. There are no pleural effusions. Hilar, mediastinal and cardiac contours appear unremarkable. There is no significant interval change.  IMPRESSION: Hyperinflation.  No acute cardiopulmonary findings.   Electronically Signed   By: Ellery Plunk M.D.   On: 11/06/2014 20:47   Dg Wrist Complete Right  10/25/2014   CLINICAL DATA:  Larey Seat yesterday in church in parking lot while trying to catch her sister who was falling, pain and swelling RIGHT wrist, tight feeling, initial encounter  EXAM: RIGHT WRIST - COMPLETE 3+ VIEW  COMPARISON:  None  FINDINGS: Mild osseous demineralization.  Joint spaces preserved.  No acute fracture, dislocation or bone destruction.  IMPRESSION: No acute osseous abnormalities.   Electronically Signed   By: Ulyses Southward M.D.   On: 10/25/2014 14:58      ASSESSMENT AND PLAN: 1. Rapid atrial fibrillation: HR will slow down with pulmonary disease improvement. For feasibility, will change to long-acting diltiazem 240 mg q am and 120 mg q pm. Continue Eliquis. May consider DCCV after 3 weeks of anticoagulation but may elect to simply rate-control. Will decide in outpatient setting.  2.  Essential HTN: Still elevated and likely being driven by disease state. Given recent increase in diltiazem dose, will hold off on adding additional meds. If it remains elevated, would resume amlodipine 5 mg.  3. Chronic diastolic heart failure: Euvolemic. Continue oral Lasix.  4. COPD exacerbation: Rx as per pulmonary.  Kate Sable, M.D., F.A.C.C.

## 2014-11-13 LAB — HEMOGLOBIN A1C
HEMOGLOBIN A1C: 6.9 % — AB (ref 4.8–5.6)
MEAN PLASMA GLUCOSE: 151 mg/dL

## 2014-11-13 MED ORDER — METHYLPREDNISOLONE (PAK) 4 MG PO TABS: ORAL_TABLET | ORAL | Status: DC

## 2014-11-13 MED ORDER — FUROSEMIDE 20 MG PO TABS: ORAL_TABLET | ORAL | Status: DC

## 2014-11-13 MED ORDER — DILTIAZEM HCL ER COATED BEADS 120 MG PO CP24: 120.0000 mg | ORAL_CAPSULE | Freq: Every day | ORAL | Status: DC

## 2014-11-13 MED ORDER — LEVALBUTEROL HCL 0.63 MG/3ML IN NEBU: 0.6300 mg | INHALATION_SOLUTION | Freq: Four times a day (QID) | RESPIRATORY_TRACT | Status: DC | PRN

## 2014-11-13 MED ORDER — DOXYCYCLINE HYCLATE 100 MG PO TABS: 100.0000 mg | ORAL_TABLET | Freq: Two times a day (BID) | ORAL | Status: DC

## 2014-11-13 MED ORDER — APIXABAN 5 MG PO TABS: 5.0000 mg | ORAL_TABLET | Freq: Two times a day (BID) | ORAL | Status: DC

## 2014-11-13 NOTE — Discharge Summary (Signed)
Physician Discharge Summary  Patient ID: Yolanda [name] MRN: [id number] DOB/AGE: November 21, 1940 74 y.o. Primary Care Physician:Kallan Bischoff L, MD Admit date: 11/06/2014 Discharge date: 11/13/2014    Discharge Diagnoses:   Principal Problem:   COPD exacerbation Active Problems:   GERD (gastroesophageal reflux disease)   HTN (hypertension)   Neuropathic pain   Hypothyroidism   Chronic diastolic CHF (congestive heart failure)   Hyperkalemia   PAF (paroxysmal atrial fibrillation)     Medication List    STOP taking these medications        albuterol (2.5 MG/3ML) 0.083% nebulizer solution  Commonly known as:  PROVENTIL     amLODipine 5 MG tablet  Commonly known as:  NORVASC     sulfamethoxazole-trimethoprim 800-160 MG per tablet  Commonly known as:  BACTRIM DS,SEPTRA DS      TAKE these medications        acetaminophen 500 MG tablet  Commonly known as:  TYLENOL  Take 500-1,000 mg by mouth as needed for mild pain.     apixaban 5 MG Tabs tablet  Commonly known as:  ELIQUIS  Take 1 tablet (5 mg total) by mouth 2 (two) times daily.     cetirizine 10 MG tablet  Commonly known as:  ZYRTEC  Take 10 mg by mouth daily.     diltiazem 120 MG 24 hr capsule  Commonly known as:  CARDIZEM CD  Take 1 capsule (120 mg total) by mouth daily. 2 every AM then 1 PM     doxycycline 100 MG tablet  Commonly known as:  VIBRA-TABS  Take 1 tablet (100 mg total) by mouth every 12 (twelve) hours.     famotidine 20 MG tablet  Commonly known as:  PEPCID  One at bedtime     flintstones complete 60 MG chewable tablet  Chew 3 tablets by mouth daily.     Fluticasone Furoate-Vilanterol 200-25 MCG/INH Aepb  Inhale 1 puff into the lungs daily.     furosemide 20 MG tablet  Commonly known as:  LASIX  Take 2 daily     levalbuterol 0.63 MG/3ML nebulizer solution  Commonly known as:  XOPENEX  Take 3 mLs (0.63 mg total) by nebulization every 6 (six) hours as needed for wheezing or shortness of  breath.     levothyroxine 137 MCG tablet  Commonly known as:  SYNTHROID, LEVOTHROID  Take 137 mcg by mouth daily before breakfast.     losartan 100 MG tablet  Commonly known as:  COZAAR  Take 100 mg by mouth daily.     methylPREDNIsolone 4 MG tablet  Commonly known as:  MEDROL DOSPACK  follow package directions     NEURONTIN 300 MG capsule  Generic drug:  gabapentin  Take 300 mg by mouth at bedtime as needed (Restless Legs).     omeprazole 20 MG capsule  Commonly known as:  PRILOSEC  Take 1 capsule (20 mg total) by mouth daily.     potassium chloride SA 20 MEQ tablet  Commonly known as:  K-DUR,KLOR-CON  Take 1 tablet (20 mEq total) by mouth daily.     VOLTAREN 1 % Gel  Generic drug:  diclofenac sodium  Apply 1 application topically daily as needed.        Discharged Condition: Improved    Consults: Cardiology  Significant Diagnostic Studies: Dg Chest 2 View (if Patient Has Fever And/or Copd)  11/06/2014   CLINICAL DATA:  Dyspnea cough and wheezing for 5 days.  EXAM: CHEST  2 VIEW  COMPARISON:  07/01/2014.  FINDINGS: There is moderate hyperinflation, unchanged. The lungs are clear. The pulmonary vasculature is normal. There are no pleural effusions. Hilar, mediastinal and cardiac contours appear unremarkable. There is no significant interval change.  IMPRESSION: Hyperinflation.  No acute cardiopulmonary findings.   Electronically Signed   By: Ellery Plunk M.D.   On: 11/06/2014 20:47   Dg Wrist Complete Right  10/25/2014   CLINICAL DATA:  Larey Seat yesterday in church in parking lot while trying to catch her sister who was falling, pain and swelling RIGHT wrist, tight feeling, initial encounter  EXAM: RIGHT WRIST - COMPLETE 3+ VIEW  COMPARISON:  None  FINDINGS: Mild osseous demineralization.  Joint spaces preserved.  No acute fracture, dislocation or bone destruction.  IMPRESSION: No acute osseous abnormalities.   Electronically Signed   By: Ulyses Southward M.D.   On: 10/25/2014  14:58    Lab Results: Basic Metabolic Panel:  Recent Labs  16/10/96 0454  NA 141  K 4.0  CL 110  CO2 24  GLUCOSE 192*  BUN 49*  CREATININE 1.09  CALCIUM 9.5   Liver Function Tests: No results for input(s): AST, ALT, ALKPHOS, BILITOT, PROT, ALBUMIN in the last 72 hours.   CBC:  Recent Labs  11/11/14 0454  WBC 9.7  NEUTROABS 8.9*  HGB 10.9*  HCT 34.0*  MCV 92.6  PLT 222    Recent Results (from the past 240 hour(s))  MRSA PCR Screening     Status: None   Collection Time: 11/09/14 11:10 AM  Result Value Ref Range Status   MRSA by PCR NEGATIVE NEGATIVE Final    Comment:        The GeneXpert MRSA Assay (FDA approved for NASAL specimens only), is one component of a comprehensive MRSA colonization surveillance program. It is not intended to diagnose MRSA infection nor to guide or monitor treatment for MRSA infections.      Hospital Course: This is a 74 year old who's been having shortness of breath for about the last week or so. She eventually came to the emergency department because she had significant increase in her shortness of breath. She was treated in the emergency department but was not able to be discharged. She was started on IV steroids inhaled bronchodilators and antibiotics. She was slowly improving but developed atrial fibrillation with rapid ventricular response. She was transferred to stepdown placed on diltiazem and had cardiology consultation. Diltiazem helped control her heart rate. She was given Lasix because of her history of diastolic heart failure and her rapid rate. She improved over the next several days. She initially required oxygen but later in her hospital stay her oxygen saturation was in the low 90s on room air. Her heart rate ranged between 80 and 110 on current treatments. Her chest is much clearer.  Discharge Exam: Blood pressure 157/68, pulse 101, temperature 98.4 F (36.9 C), temperature source Oral, resp. rate 20, height   (1.575 m), weight 104.2 kg (229 lb 11.5 oz), SpO2 90 %. She is awake and alert. Her heart is still irregular. She has minimal rhonchi now.  Disposition: Home with home health services      Discharge Instructions    Face-to-face encounter (required for Medicare/Medicaid patients)    Complete by:  As directed   I Karli Wickizer L certify that this patient is under my care and that I, or a nurse practitioner or physician's assistant working with me, had a face-to-face encounter that meets the physician face-to-face encounter requirements with this  patient on 11/13/2014. The encounter with the patient was in whole, or in part for the following medical condition(s) which is the primary reason for home health care (List medical condition): copd/atrial fib  The encounter with the patient was in whole, or in part, for the following medical condition, which is the primary reason for home health care:  COPD/Atrial fib  I certify that, based on my findings, the following services are medically necessary home health services:  Nursing  Reason for Medically Necessary Home Health Services:  Skilled Nursing- Change/Decline in Patient Status  My clinical findings support the need for the above services:  Shortness of breath with activity  Further, I certify that my clinical findings support that this patient is homebound due to:  Shortness of Breath with activity     Home Health    Complete by:  As directed   To provide the following care/treatments:  RN  BMET next week             Signed: Kyandre Okray L   11/13/2014, 10:22 AM

## 2014-11-13 NOTE — Progress Notes (Signed)
She says she feels better. She is hopeful of being discharged. Her breathing is okay. Her heart rate has been better.  She is awake and alert and looks much better in general. Her temperature is 98.4 pulse 101 respirations 20 blood pressure 157/68 O2 sat 90% Her chest is clearer but still has some rhonchi and she is still somewhat irregular with her heartbeat  She is improved enough I think to go home  Discharge home today

## 2014-11-13 NOTE — Progress Notes (Signed)
[facility]  Patient was given discharge instructions and care notes on COPD, Afib, and all new medications.  Patient verbalized understanding with no complaints or concerns voiced at this time.  Heart monitor was removed, CCMD notified, and IV was removed with catheter intact, no bleeding or complications.  Patient left unit in stable condition by staff member in a wheelchair.

## 2014-11-14 NOTE — Progress Notes (Signed)
Patient called unit yesterday questioning her inhaler and new prescription for her nebulizer solutions, stating she did not receive them with her new medications.  Spoke with Dr. Juanetta GoslingHawkins this morning.  Patient should continue to use current inhaler as prescribed.  Spoke with patient's pharmacy regarding the nebulizer solutions.  They do not carry the levalbuterol solution and would have to order it for the patient.  Per Dr. Juanetta GoslingHawkins, ok for patient to use 1/2 dose albuterol solution that patient already has until new levalbuterol solution comes in to her pharmacy.   I spoke with patient this morning and relayed this message.  Patient understood with no questions at this time and was able to do teach back with the instructions I gave her, she stated she wrote it down as well.  Patient to follow up with Dr. Juanetta GoslingHawkins this week.

## 2014-11-18 ENCOUNTER — Ambulatory Visit (INDEPENDENT_AMBULATORY_CARE_PROVIDER_SITE_OTHER): Payer: Medicare Other | Admitting: Cardiovascular Disease

## 2014-11-18 ENCOUNTER — Encounter: Payer: Self-pay | Admitting: Cardiovascular Disease

## 2014-11-18 VITALS — BP 136/60 | HR 92 | Ht 62.0 in | Wt 225.0 lb

## 2014-11-18 DIAGNOSIS — I481 Persistent atrial fibrillation: Secondary | ICD-10-CM

## 2014-11-18 DIAGNOSIS — I4819 Other persistent atrial fibrillation: Secondary | ICD-10-CM

## 2014-11-18 DIAGNOSIS — I480 Paroxysmal atrial fibrillation: Secondary | ICD-10-CM

## 2014-11-18 DIAGNOSIS — N183 Chronic kidney disease, stage 3 unspecified: Secondary | ICD-10-CM

## 2014-11-18 DIAGNOSIS — R600 Localized edema: Secondary | ICD-10-CM

## 2014-11-18 DIAGNOSIS — I5032 Chronic diastolic (congestive) heart failure: Secondary | ICD-10-CM

## 2014-11-18 DIAGNOSIS — I10 Essential (primary) hypertension: Secondary | ICD-10-CM

## 2014-11-18 MED ORDER — DIGOXIN 125 MCG PO TABS: 0.1250 mg | ORAL_TABLET | Freq: Every day | ORAL | Status: DC

## 2014-11-18 NOTE — Patient Instructions (Signed)
Your physician recommends that you schedule a follow-up appointment in: [place] in March   START Digoxin 0.125 mg daily   Thank you for choosing [facility] !

## 2014-11-18 NOTE — Progress Notes (Signed)
Patient ID: Yolanda [name], female   DOB: Jun 12, 1941, 74 y.o.   MRN: [id number]      SUBJECTIVE: The patient presents for routine follow-up after being hospitalized for a COPD exacerbation resulting in rapid atrial fibrillation. She also has a history of chronic diastolic heart failure and essential hypertension. She is feeling much better although she does feel somewhat weak. She has had some mild left leg swelling. She denies chest pain and says that her breathing is gradually improving. She has had no bleeding problems on Eliquis. She is taking long-acting diltiazem 240 mg in the morning and 120 mg in the evening. GFR 50 ml/min on 2/18. ECG performed in the office today demonstrates atrial fibrillation, heart rate 90 bpm, with an incomplete right bundle-branch block.   Review of Systems: As per "subjective", otherwise negative.  Allergies  Allergen Reactions  . Ciprofloxacin Shortness Of Breath    REACTION: sob,tachycardia  . Fish Oil     Patient face drew to the side,Bells Palsey  . Guaifenesin Shortness Of Breath    REACTION: sob,tachycardia  . Adhesive [Tape] Other (See Comments)    Causes blisters on skin  . Tramadol     insomnia    Current Outpatient Prescriptions  Medication Sig Dispense Refill  . acetaminophen (TYLENOL) 500 MG tablet Take 500-1,000 mg by mouth as needed for mild pain.     Marland Kitchen. apixaban (ELIQUIS) 5 MG TABS tablet Take 1 tablet (5 mg total) by mouth 2 (two) times daily. 60 tablet 5  . cetirizine (ZYRTEC) 10 MG tablet Take 10 mg by mouth daily.    Marland Kitchen. diltiazem (CARDIZEM CD) 120 MG 24 hr capsule Take 1 capsule (120 mg total) by mouth daily. 2 every AM then 1 PM 90 capsule 12  . famotidine (PEPCID) 20 MG tablet One at bedtime (Patient taking differently: Take 20 mg by mouth at bedtime. ) 30 tablet 2  . flintstones complete (FLINTSTONES) 60 MG chewable tablet Chew 3 tablets by mouth daily.    . Fluticasone Furoate-Vilanterol 200-25 MCG/INH AEPB Inhale 1 puff into  the lungs daily.    . furosemide (LASIX) 20 MG tablet Take 2 daily 45 tablet 6  . gabapentin (NEURONTIN) 300 MG capsule Take 300 mg by mouth at bedtime as needed (Restless Legs).     Marland Kitchen. levalbuterol (XOPENEX) 0.63 MG/3ML nebulizer solution Take 3 mLs (0.63 mg total) by nebulization every 6 (six) hours as needed for wheezing or shortness of breath. 90 mL 12  . levothyroxine (SYNTHROID, LEVOTHROID) 137 MCG tablet Take 137 mcg by mouth daily before breakfast.    . losartan (COZAAR) 100 MG tablet Take 100 mg by mouth daily.    . methylPREDNIsolone (MEDROL DOSPACK) 4 MG tablet follow package directions 21 tablet 0  . omeprazole (PRILOSEC) 20 MG capsule Take 1 capsule (20 mg total) by mouth daily. 90 capsule 3  . potassium chloride SA (K-DUR,KLOR-CON) 20 MEQ tablet Take 1 tablet (20 mEq total) by mouth daily. 30 tablet 6  . VOLTAREN 1 % GEL Apply 1 application topically daily as needed.  0   No current facility-administered medications for this visit.    Past Medical History  Diagnosis Date  . Thyroid condition   . Hypertension   . GERD (gastroesophageal reflux disease)   . COPD (chronic obstructive pulmonary disease)   . Bronchitis   . Barrett esophagus     Past Surgical History  Procedure Laterality Date  . Cholecystectomy    . Colonoscopy    .  Upper gastrointestinal endoscopy    . Right breast biopsy  Sept, 2012    History   Social History  . Marital Status: Widowed    Spouse Name: N/A  . Number of Children: N/A  . Years of Education: N/A   Occupational History  . Not on file.   Social History Main Topics  . Smoking status: Former Smoker -- 0.50 packs/day for 30 years    Types: Cigarettes    Start date: 09/24/1958    Quit date: 11/13/1999  . Smokeless tobacco: Never Used  . Alcohol Use: No  . Drug Use: No  . Sexual Activity: Not on file   Other Topics Concern  . Not on file   Social History Narrative     Filed Vitals:   11/18/14 1013  BP: 136/60  Pulse: 92    Height:  (1.575 m)  Weight: 225 lb (102.059 kg)  SpO2: 95%    PHYSICAL EXAM General: NAD HEENT: Normal. Neck: No JVD, no thyromegaly. Lungs: Clear to auscultation bilaterally with normal respiratory effort. CV: Nondisplaced PMI.  Regular rate, mostly regular rhythm, normal S1/S2, no S3/S4, no murmur. Trace pretibial and periankle edema. Normal pedal pulses.  Abdomen: Soft, obese, no distention.  Neurologic: Alert and oriented x 3.  Psych: Normal affect. Skin: Normal. Musculoskeletal: Normal range of motion, no gross deformities. Extremities: No clubbing or cyanosis.   ECG: Most recent ECG reviewed.      ASSESSMENT AND PLAN: 1. Atrial fibrillation: Currently on long-acting diltiazem 240 mg q am and 120 mg q pm, and tolerating Eliquis for anticoagulation without difficulties. I will initiate digoxin 0.125 mg daily for the time being. My hope is that she will cardiovert on her own. If she has persistent fatigue when I evaluate her on 3/15, I would then consider direct current cardioversion. 2. Chronic diastolic heart failure: Euvolemic on 40 mg Lasix daily. No changes. 3. Essential hypertension: Well controlled on current therapy. No changes. 4. COPD with recent exacerbation: Stable and gradually improving.  Dispo: f/u in [place] office on 3/15 as previously scheduled.  Prentice Docker, M.D., F.A.C.C.

## 2014-11-19 ENCOUNTER — Telehealth (INDEPENDENT_AMBULATORY_CARE_PROVIDER_SITE_OTHER): Payer: Self-pay | Admitting: *Deleted

## 2014-11-19 NOTE — Telephone Encounter (Signed)
Noted  

## 2014-11-19 NOTE — Telephone Encounter (Signed)
Yolanda [name] has recently been hospitalize at [name] and is now under the care of Home Health. [facility] has lab work order for her. Yolanda [name] is going to get Home Health to send Dr. Karilyn Cotaehman a copy of her lab work. Patient was advised that we will let her and the home health company know if any additional labs are needed.

## 2014-11-26 ENCOUNTER — Telehealth: Payer: Self-pay | Admitting: Cardiovascular Disease

## 2014-11-26 NOTE — Telephone Encounter (Signed)
Would hold for 72 hours beforehand.

## 2014-11-26 NOTE — Telephone Encounter (Signed)
Pt.notified

## 2014-11-26 NOTE — Telephone Encounter (Signed)
May need repeat spinal injection for chronic back pain (last injection  Done was before she was placed on Eliquis) and is wondering how long she would need to hold Eliquis       Will forward to Dr Purvis SheffieldKoneswaran

## 2014-11-26 NOTE — Telephone Encounter (Signed)
Please call patient regarding back pain / tgs

## 2014-12-07 ENCOUNTER — Ambulatory Visit (INDEPENDENT_AMBULATORY_CARE_PROVIDER_SITE_OTHER): Payer: Medicare Other | Admitting: Cardiovascular Disease

## 2014-12-07 ENCOUNTER — Encounter: Payer: Self-pay | Admitting: Cardiovascular Disease

## 2014-12-07 VITALS — BP 108/50 | HR 88 | Ht 62.0 in | Wt 235.0 lb

## 2014-12-07 DIAGNOSIS — I5033 Acute on chronic diastolic (congestive) heart failure: Secondary | ICD-10-CM

## 2014-12-07 DIAGNOSIS — N183 Chronic kidney disease, stage 3 unspecified: Secondary | ICD-10-CM

## 2014-12-07 DIAGNOSIS — I4819 Other persistent atrial fibrillation: Secondary | ICD-10-CM

## 2014-12-07 DIAGNOSIS — I481 Persistent atrial fibrillation: Secondary | ICD-10-CM

## 2014-12-07 DIAGNOSIS — R5383 Other fatigue: Secondary | ICD-10-CM

## 2014-12-07 DIAGNOSIS — I10 Essential (primary) hypertension: Secondary | ICD-10-CM

## 2014-12-07 DIAGNOSIS — R600 Localized edema: Secondary | ICD-10-CM

## 2014-12-07 MED ORDER — FUROSEMIDE 40 MG PO TABS: 40.0000 mg | ORAL_TABLET | Freq: Two times a day (BID) | ORAL | Status: DC

## 2014-12-07 NOTE — Patient Instructions (Signed)
Your physician recommends that you schedule a follow-up appointment in: next week   INCREASE Lasix to 40 mg twice a day   BMET Lab on Friday     Thank you for choosing [facility] !

## 2014-12-07 NOTE — Progress Notes (Signed)
Patient ID: Yolanda [name], female   DOB: 01-27-1941, 74 y.o.   MRN: [id number]      SUBJECTIVE: The patient presents for routine follow-up for atrial fibrillation and diastolic heart failure. She no longer has palpitations and feels that she knew when she went back into a regular rhythm. She has had increased leg swelling and has also had back pain since stopping prednisone. She denies bleeding problems. Her weight by home scale on 2/25 was 225 lbs which initially went down to 218 lbs but is now up to 228 lbs today. She has felt generalized weakness, which is somewhat improved since she got out of the hospital. She has been taking gabapentin which she feels has led to more fatigue and drowsiness.   Review of Systems: As per "subjective", otherwise negative.  Allergies  Allergen Reactions  . Ciprofloxacin Shortness Of Breath    REACTION: sob,tachycardia  . Fish Oil     Patient face drew to the side,Bells Palsey  . Guaifenesin Shortness Of Breath    REACTION: sob,tachycardia  . Adhesive [Tape] Other (See Comments)    Causes blisters on skin  . Tramadol     insomnia    Current Outpatient Prescriptions  Medication Sig Dispense Refill  . acetaminophen (TYLENOL) 500 MG tablet Take 500-1,000 mg by mouth as needed for mild pain.     Marland Kitchen. apixaban (ELIQUIS) 5 MG TABS tablet Take 1 tablet (5 mg total) by mouth 2 (two) times daily. 60 tablet 5  . cetirizine (ZYRTEC) 10 MG tablet Take 10 mg by mouth daily.    . digoxin (LANOXIN) 0.125 MG tablet Take 1 tablet (0.125 mg total) by mouth daily. 90 tablet 3  . diltiazem (CARDIZEM CD) 120 MG 24 hr capsule Take 1 capsule (120 mg total) by mouth daily. 2 every AM then 1 PM 90 capsule 12  . doxycycline (VIBRA-TABS) 100 MG tablet Take 100 mg by mouth 2 (two) times daily.  0  . famotidine (PEPCID) 20 MG tablet One at bedtime (Patient taking differently: Take 20 mg by mouth at bedtime. ) 30 tablet 2  . flintstones complete (FLINTSTONES) 60 MG chewable tablet  Chew 3 tablets by mouth daily.    . Fluticasone Furoate-Vilanterol 200-25 MCG/INH AEPB Inhale 1 puff into the lungs daily.    . furosemide (LASIX) 20 MG tablet Take 2 daily 45 tablet 6  . gabapentin (NEURONTIN) 300 MG capsule Take 300 mg by mouth at bedtime as needed (Restless Legs).     Marland Kitchen. levalbuterol (XOPENEX) 0.63 MG/3ML nebulizer solution Take 3 mLs (0.63 mg total) by nebulization every 6 (six) hours as needed for wheezing or shortness of breath. 90 mL 12  . levothyroxine (SYNTHROID, LEVOTHROID) 137 MCG tablet Take 137 mcg by mouth daily before breakfast.    . losartan (COZAAR) 100 MG tablet Take 100 mg by mouth daily.    Marland Kitchen. omeprazole (PRILOSEC) 20 MG capsule Take 1 capsule (20 mg total) by mouth daily. 90 capsule 3  . potassium chloride SA (K-DUR,KLOR-CON) 20 MEQ tablet Take 1 tablet (20 mEq total) by mouth daily. 30 tablet 6  . VOLTAREN 1 % GEL Apply 1 application topically daily as needed.  0   No current facility-administered medications for this visit.    Past Medical History  Diagnosis Date  . Thyroid condition   . Hypertension   . GERD (gastroesophageal reflux disease)   . COPD (chronic obstructive pulmonary disease)   . Bronchitis   . Barrett esophagus  Past Surgical History  Procedure Laterality Date  . Cholecystectomy    . Colonoscopy    . Upper gastrointestinal endoscopy    . Right breast biopsy  Sept, 2012    History   Social History  . Marital Status: Widowed    Spouse Name: N/A  . Number of Children: N/A  . Years of Education: N/A   Occupational History  . Not on file.   Social History Main Topics  . Smoking status: Former Smoker -- 0.50 packs/day for 30 years    Types: Cigarettes    Start date: 09/24/1958    Quit date: 11/13/1999  . Smokeless tobacco: Never Used  . Alcohol Use: No  . Drug Use: No  . Sexual Activity: Not on file   Other Topics Concern  . Not on file   Social History Narrative     Filed Vitals:   12/07/14 1320  BP:  108/50  Pulse: 88  Height:  (1.575 m)  Weight: 235 lb (106.595 kg)    PHYSICAL EXAM General: NAD HEENT: Normal. Neck: No JVD, no thyromegaly. Lungs: Diminished b/l, no rales or wheezes. CV: Nondisplaced PMI.  Regular rate and rhythm, normal S1/S2, no S3/S4, no murmur. 1+pretibial and periankle edema of the b/l LE's, left > right with left pretibial erythema.    Abdomen: Soft, nontender,obese.  Neurologic: Alert and oriented x 3.  Psych: Normal affect. Skin: Left pretibial erythema. Musculoskeletal: Normal range of motion. Extremities: No clubbing or cyanosis.   ECG: Most recent ECG reviewed.      ASSESSMENT AND PLAN: 1. Atrial fibrillation: Symptomatically stable and now in a regular rhythm. Currently on long-acting diltiazem 240 mg q am and 120 mg q pm, and tolerating Eliquis for anticoagulation without difficulties. I will continue digoxin 0.125 mg daily for the time being.  2. Chronic diastolic heart failure: Marked lower extremity edema on Lasix 40 mg daily. Will increase to 40 mg bid and check BMET on 3/18. 3. Essential hypertension: Well controlled to low normal on current therapy. No changes. 4. COPD with recent exacerbation: Stable. 5. Fatigue/drowsiness: I recommended she hold gabapentin for now.  Dispo: f/u 1 week.  Prentice Docker, M.D., F.A.C.C.

## 2014-12-09 LAB — BASIC METABOLIC PANEL
BUN: 33 mg/dL — AB (ref 6–23)
CO2: 24 meq/L (ref 19–32)
Calcium: 9.7 mg/dL (ref 8.4–10.5)
Chloride: 102 mEq/L (ref 96–112)
Creat: 1.23 mg/dL — ABNORMAL HIGH (ref 0.50–1.10)
Glucose, Bld: 135 mg/dL — ABNORMAL HIGH (ref 70–99)
POTASSIUM: 5.5 meq/L — AB (ref 3.5–5.3)
Sodium: 138 mEq/L (ref 135–145)

## 2014-12-10 ENCOUNTER — Telehealth: Payer: Self-pay

## 2014-12-10 DIAGNOSIS — E875 Hyperkalemia: Secondary | ICD-10-CM

## 2014-12-10 MED ORDER — FUROSEMIDE 40 MG PO TABS: ORAL_TABLET | ORAL | Status: DC

## 2014-12-10 NOTE — Telephone Encounter (Signed)
Pt will now alternate Lasix dosing,will hold potassium for 5 days and then resume,repeat BMEt in 1 week

## 2014-12-10 NOTE — Telephone Encounter (Signed)
-----   Message from Laqueta LindenSuresh A Koneswaran, MD sent at 12/10/2014  9:26 AM EDT ----- Switch Lasix to 40 mg and 40 mg twice daily on alternate days. K mildly elevated. Hold KCl for 5 days, then resume at previous maintenance dose.. Repeat BMET in one week.

## 2014-12-15 ENCOUNTER — Encounter: Payer: Self-pay | Admitting: Physician Assistant

## 2014-12-15 ENCOUNTER — Ambulatory Visit (INDEPENDENT_AMBULATORY_CARE_PROVIDER_SITE_OTHER): Payer: Medicare Other | Admitting: Physician Assistant

## 2014-12-15 ENCOUNTER — Encounter (INDEPENDENT_AMBULATORY_CARE_PROVIDER_SITE_OTHER): Payer: Self-pay

## 2014-12-15 ENCOUNTER — Ambulatory Visit: Payer: Medicare Other | Admitting: Cardiovascular Disease

## 2014-12-15 ENCOUNTER — Telehealth: Payer: Self-pay | Admitting: Cardiovascular Disease

## 2014-12-15 VITALS — BP 118/56 | HR 88 | Ht 62.0 in | Wt 231.0 lb

## 2014-12-15 DIAGNOSIS — I10 Essential (primary) hypertension: Secondary | ICD-10-CM

## 2014-12-15 DIAGNOSIS — I480 Paroxysmal atrial fibrillation: Secondary | ICD-10-CM | POA: Diagnosis not present

## 2014-12-15 DIAGNOSIS — I5032 Chronic diastolic (congestive) heart failure: Secondary | ICD-10-CM | POA: Diagnosis not present

## 2014-12-15 DIAGNOSIS — L03116 Cellulitis of left lower limb: Secondary | ICD-10-CM | POA: Insufficient documentation

## 2014-12-15 DIAGNOSIS — E875 Hyperkalemia: Secondary | ICD-10-CM

## 2014-12-15 NOTE — Assessment & Plan Note (Signed)
Potassium was held for 5 days. Home health to draw BMP tomorrow.

## 2014-12-15 NOTE — Telephone Encounter (Signed)
Patient was scheduled for today per Dr.K.  Patient rescheduled appointment due to not feeling well. Needs to know what to do about her potassium. / tg

## 2014-12-15 NOTE — Patient Instructions (Signed)
Your physician recommends that you schedule a follow-up appointment in: 1 month with Dr. Purvis SheffieldKoneswaran  Your physician recommends that you continue on your current medications as directed. Please refer to the Current Medication list given to you today.  [facility]   Thank you for choosing [facility]!

## 2014-12-15 NOTE — Assessment & Plan Note (Signed)
Patient's heart failure is improved on current diuretic dose. She has lost 4 pounds. Continue current diuretics. Will have home health draw BMP Thursday or Friday. Long discussion about low salt diet. Follow-up with Dr. Kirtland BouchardK in one month.

## 2014-12-15 NOTE — Progress Notes (Signed)
Cardiology Office Note   Date:  12/15/2014   ID:  Yolanda [name], Yolanda [name] 20-Sep-1941, MRN [id number]  PCP:  Fredirick Maudlin, MD  Cardiologist:  Purvis Sheffield, MD Chief Complaint:swelling    History of Present Illness: Yolanda [name] is a 74 y.o. female who presents for follow-up of chronic diastolic heart failure. She saw Dr. Doylene Canard sworn last week with marked lower extremity edema and increased her Lasix to 40 mg twice a day alternating with 40 mg daily. She also had atrial fib and had converted to normal sinus rhythm. Since she was last seen she developed cellulitis of her left lower leg. Dr. Juanetta Gosling placed her on doxycycline. She took 8 days of this and became very nauseated and complained of stomach pains. She called him and he switched her to a sulfa drug that made her sick as well. She took 3 days of this. She is here for follow-up.  Patient's cellulitis seems to have resolved. She's lost 4 pounds on the current diuretic regimen. She is very fatigued and worn out from taken the antibiotics. She says she was so nauseated she couldn't eat. She did have bacon for breakfast this morning. She has been using salt as well. Overall her breathing is stable and edema decreased. She denies palpitations or chest pain. Her potassium was elevated at 5.5 and K Dur was held for 5 days. She is scheduled for blood work this Friday.    Past Medical History  Diagnosis Date  . Thyroid condition   . Hypertension   . GERD (gastroesophageal reflux disease)   . COPD (chronic obstructive pulmonary disease)   . Bronchitis   . Barrett esophagus     Past Surgical History  Procedure Laterality Date  . Cholecystectomy    . Colonoscopy    . Upper gastrointestinal endoscopy    . Right breast biopsy  Sept, 2012     Current Outpatient Prescriptions  Medication Sig Dispense Refill  . acetaminophen (TYLENOL) 500 MG tablet Take 500-1,000 mg by mouth as needed for mild pain.     Marland Kitchen apixaban (ELIQUIS) 5 MG TABS  tablet Take 1 tablet (5 mg total) by mouth 2 (two) times daily. 60 tablet 5  . cetirizine (ZYRTEC) 10 MG tablet Take 10 mg by mouth daily.    . digoxin (LANOXIN) 0.125 MG tablet Take 1 tablet (0.125 mg total) by mouth daily. 90 tablet 3  . diltiazem (CARDIZEM CD) 120 MG 24 hr capsule Take 1 capsule (120 mg total) by mouth daily. 2 every AM then 1 PM (Patient taking differently: Take 120 mg by mouth daily. Dose/instruction change 12/02/14: Take 1 capsule (120 mg) by mouth daily) 90 capsule 12  . famotidine (PEPCID) 20 MG tablet One at bedtime (Patient taking differently: Take 20 mg by mouth at bedtime. ) 30 tablet 2  . flintstones complete (FLINTSTONES) 60 MG chewable tablet Chew 3 tablets by mouth daily.    . Fluticasone Furoate-Vilanterol 200-25 MCG/INH AEPB Inhale 1 puff into the lungs daily.    . furosemide (LASIX) 40 MG tablet Pt will take lasix 40 mg daily with 40 mg BID alternating days 180 tablet 3  . levalbuterol (XOPENEX) 0.63 MG/3ML nebulizer solution Take 3 mLs (0.63 mg total) by nebulization every 6 (six) hours as needed for wheezing or shortness of breath. 90 mL 12  . levothyroxine (SYNTHROID, LEVOTHROID) 137 MCG tablet Take 137 mcg by mouth daily before breakfast.    . losartan (COZAAR) 100 MG tablet Take 100 mg by mouth  daily.    . omeprazole (PRILOSEC) 20 MG capsule Take 1 capsule (20 mg total) by mouth daily. 90 capsule 3  . potassium chloride SA (K-DUR,KLOR-CON) 20 MEQ tablet Take 1 tablet (20 mEq total) by mouth daily. 30 tablet 6  . VOLTAREN 1 % GEL Apply 1 application topically daily as needed.  0   No current facility-administered medications for this visit.    Allergies:   Ciprofloxacin; Doxycycline; Fish oil; Guaifenesin; Sulfamethoxazole w/trimethoprim 800-160; Adhesive; and Tramadol    Social History:  The patient  reports that she quit smoking about 15 years ago. Her smoking use included Cigarettes. She started smoking about 56 years ago. She has a 15 pack-year smoking  history. She has never used smokeless tobacco. She reports that she does not drink alcohol or use illicit drugs.   Family History:  The patient's family history includes Dementia in her mother; Diabetes in her brother; Healthy in her sister; Hypertension in her sister and sister; Lung cancer in her father; Obesity in her sister; Restless legs syndrome in her sister.    ROS:  Please see the history of present illness.   Otherwise, review of systems are positive for none.   All other systems are reviewed and negative.    PHYSICAL EXAM: VS:  BP 118/56 mmHg  Pulse 88  Ht  (1.575 m)  Wt 231 lb (104.781 kg)  BMI 42.24 kg/m2  SpO2 94% , BMI Body mass index is 42.24 kg/(m^2). Neck: no JVD, HJR, carotid bruits, or masses Cardiac: RRR; no gallop ,murmurs, rubs, thrill or heave,no edema,   Respiratory:  Decreased breath sounds but clear to auscultation bilaterally, normal work of breathing GI: soft, nontender, nondistended, + BS MS: no deformity or atrophy Extremities: +1 edema of the left lower extremity with resolving cellulitis on the anterior tibial region. Trace of ankle edema on the right ankle. Decreased but present distal pulses. Skin: warm and dry, no rash    EKG:  EKG is not ordered today.    Recent Labs: 11/07/2014: ALT 81*; B Natriuretic Peptide 34.0 11/09/2014: Magnesium 2.0 11/11/2014: Hemoglobin 10.9*; Platelets 222 12/07/2014: BUN 33*; Creatinine 1.23*; Potassium 5.5*; Sodium 138    Lipid Panel No results found for: CHOL, TRIG, HDL, CHOLHDL, VLDL, LDLCALC, LDLDIRECT    Wt Readings from Last 3 Encounters:  12/15/14 231 lb (104.781 kg)  12/07/14 235 lb (106.595 kg)  11/18/14 225 lb (102.059 kg)      Other studies Reviewed: Additional studies/ records that were reviewed today include and review of the records demonstrates: 2-D echo 12/30/13 Study Conclusions  - Procedure narrative: Transthoracic echocardiography. Image   quality was suboptimal. The study was  technically   difficult, as a result of poor sound wave transmission and   body habitus. - Left ventricle: The cavity size was normal. Wall thickness   was increased in a pattern of mild LVH. Systolic function   was normal. The estimated ejection fraction was in the   range of 60% to 65%. Wall motion was normal; there were no   regional wall motion abnormalities. There was an increased   relative contribution of atrial contraction to ventricular   filling. Doppler parameters are consistent with abnormal   left ventricular relaxation (grade 1 diastolic   dysfunction). Doppler parameters are consistent with high   ventricular filling pressure. - Mitral valve: Mildly thickened leaflets . - Pericardium, extracardiac: A trivial pericardial effusion   was identified.    ASSESSMENT AND PLAN:  Chronic diastolic  CHF (congestive heart failure) Patient's heart failure is improved on current diuretic dose. She has lost 4 pounds. Continue current diuretics. Will have home health draw BMP Thursday or Friday. Long discussion about low salt diet. Follow-up with Dr. Kirtland BouchardK in one month.   HTN (hypertension) Blood pressure stable   PAF (paroxysmal atrial fibrillation) Patient's rate is regular. I did not order an EKG today. She is not having palpitations.   Cellulitis Patient had recent diagnosis of left lower leg cellulitis treated with antibiotics by Dr. Juanetta GoslingHawkins. Patient took 8 days of doxycycline that made her stomach hurt and was given sulfa which also causes GI upset. She took this for 3 days. I told patient to contact Dr. Juanetta GoslingHawkins and asked how long she needs to stay on antibiotic. Cellulitis seems to have resolved.   Hyperkalemia Potassium was held for 5 days. Home health to draw BMP tomorrow.     Elson ClanSigned, Tildon Silveria, PA-C  12/15/2014 1:14 PM    [facility] [address], [place], [place]  [address] Phone: [phone]; Fax: [phone]

## 2014-12-15 NOTE — Assessment & Plan Note (Signed)
Patient had recent diagnosis of left lower leg cellulitis treated with antibiotics by Dr. Juanetta GoslingHawkins. Patient took 8 days of doxycycline that made her stomach hurt and was given sulfa which also causes GI upset. She took this for 3 days. I told patient to contact Dr. Juanetta GoslingHawkins and asked how long she needs to stay on antibiotic. Cellulitis seems to have resolved.

## 2014-12-15 NOTE — Assessment & Plan Note (Signed)
Blood pressure stable ? ?

## 2014-12-15 NOTE — Assessment & Plan Note (Signed)
Patient's rate is regular. I did not order an EKG today. She is not having palpitations.

## 2014-12-20 ENCOUNTER — Telehealth: Payer: Self-pay

## 2014-12-20 DIAGNOSIS — I5032 Chronic diastolic (congestive) heart failure: Secondary | ICD-10-CM

## 2014-12-20 NOTE — Telephone Encounter (Signed)
Pt informed of lab results, she will have repeat BMET on 4/28 and fu with Dr Purvis SheffieldKoneswaran on Monday 01/24/15

## 2014-12-20 NOTE — Telephone Encounter (Signed)
-----   Message from Laqueta LindenSuresh A Koneswaran, MD sent at 12/18/2014  2:41 PM EDT ----- K normalized, continues to have renal dysfunction. Would repeat BMET prior to visit with me, ideally same week.

## 2014-12-27 ENCOUNTER — Other Ambulatory Visit (INDEPENDENT_AMBULATORY_CARE_PROVIDER_SITE_OTHER): Payer: Self-pay | Admitting: Internal Medicine

## 2014-12-28 ENCOUNTER — Ambulatory Visit: Payer: Medicare Other | Admitting: Cardiovascular Disease

## 2014-12-30 ENCOUNTER — Other Ambulatory Visit (INDEPENDENT_AMBULATORY_CARE_PROVIDER_SITE_OTHER): Payer: Self-pay | Admitting: Internal Medicine

## 2014-12-30 MED ORDER — OMEPRAZOLE 20 MG PO CPDR: 20.0000 mg | DELAYED_RELEASE_CAPSULE | Freq: Every day | ORAL | Status: DC

## 2014-12-30 NOTE — Telephone Encounter (Signed)
Rx sent to pharmacy   

## 2014-12-31 ENCOUNTER — Telehealth: Payer: Self-pay | Admitting: Cardiovascular Disease

## 2014-12-31 NOTE — Telephone Encounter (Signed)
Will forward to Dr.Koneswaran for dispo.

## 2014-12-31 NOTE — Telephone Encounter (Signed)
Gabapentin was effective then Dr. Kirtland BouchardK took her off it (the nurse didn't know why) and now the patient is having a hard time sleeping due to restless leg. She also has no appetite. Yolanda [name] is concerned that the new medication isn't doing anything for the patient's edema.

## 2015-01-03 NOTE — Telephone Encounter (Signed)
Please read my last clinic note. The patient felt gabapentin had led to increased fatigue and drowsiness, which is why I recommended she hold it at that time to see if her symptoms improved. If she is bothered by restless leg syndrome, she can resume it.

## 2015-01-03 NOTE — Telephone Encounter (Signed)
Spoke with home health nurse Bonita QuinLinda. Patient thought Gabapentin was held because of interaction with BP meds.I read to Bonita QuinLinda what you had written that states patient was c/o at visit,increased fatigue and drowsiness.they will restart gabapentin and fu with pcp regarding this.

## 2015-01-24 ENCOUNTER — Ambulatory Visit (INDEPENDENT_AMBULATORY_CARE_PROVIDER_SITE_OTHER): Payer: Medicare Other | Admitting: Cardiovascular Disease

## 2015-01-24 ENCOUNTER — Encounter: Payer: Self-pay | Admitting: Cardiovascular Disease

## 2015-01-24 VITALS — BP 130/56 | HR 93 | Ht 61.0 in | Wt 227.0 lb

## 2015-01-24 DIAGNOSIS — I10 Essential (primary) hypertension: Secondary | ICD-10-CM | POA: Diagnosis not present

## 2015-01-24 DIAGNOSIS — Z719 Counseling, unspecified: Secondary | ICD-10-CM

## 2015-01-24 DIAGNOSIS — N183 Chronic kidney disease, stage 3 unspecified: Secondary | ICD-10-CM

## 2015-01-24 DIAGNOSIS — I5032 Chronic diastolic (congestive) heart failure: Secondary | ICD-10-CM | POA: Diagnosis not present

## 2015-01-24 DIAGNOSIS — I480 Paroxysmal atrial fibrillation: Secondary | ICD-10-CM | POA: Diagnosis not present

## 2015-01-24 DIAGNOSIS — R600 Localized edema: Secondary | ICD-10-CM

## 2015-01-24 DIAGNOSIS — Z7182 Exercise counseling: Secondary | ICD-10-CM

## 2015-01-24 MED ORDER — POTASSIUM CHLORIDE CRYS ER 20 MEQ PO TBCR: 20.0000 meq | EXTENDED_RELEASE_TABLET | ORAL | Status: DC

## 2015-01-24 MED ORDER — DILTIAZEM HCL ER COATED BEADS 120 MG PO CP24: 120.0000 mg | ORAL_CAPSULE | Freq: Every day | ORAL | Status: DC

## 2015-01-24 NOTE — Patient Instructions (Signed)
Your physician recommends that you schedule a follow-up appointment in: August with Dr.Koneswaran   STOP Digoxin  Take potassium Every OTHER Day     Thank you for choosing [facility] !

## 2015-01-24 NOTE — Progress Notes (Signed)
Patient ID: Yolanda [name], female   DOB: November 24, 1940, 74 y.o.   MRN: [id number]      SUBJECTIVE: The patient presents for routine follow-up for atrial fibrillation and diastolic heart failure. She is wearing compression stockings but has some chronic lower extremity swelling, likely related to venous stasis disease due to obesity. Her PCP, Dr. Juanetta Gosling, switched her Lasix to torsemide with 40 mg and 20 mg on alternate days. He also reduced her long-acting diltiazem to 120 mg daily. Most recent basic metabolic panel on 01/20/15 showed BUN 26, creatinine 1.47, potassium 5.0, Na 133. She feels her breathing has improved and she denies chest pain and palpitations. She is frustrated by her inability to get a significant amount of physical activity, as she tires out rather quickly.   Review of Systems: As per "subjective", otherwise negative.  Allergies  Allergen Reactions  . Ciprofloxacin Shortness Of Breath    REACTION: sob,tachycardia  . Doxycycline Nausea Only    Also experienced diarrhea   . Fish Oil     Patient face drew to the side,Bells Palsey  . Guaifenesin Shortness Of Breath    REACTION: sob,tachycardia  . Sulfamethoxazole W/Trimethoprim 800-160 [Sulfamethoxazole-Trimethoprim] Nausea Only    Also lack of appetite   . Adhesive [Tape] Other (See Comments)    Causes blisters on skin  . Tramadol     insomnia    Current Outpatient Prescriptions  Medication Sig Dispense Refill  . acetaminophen (TYLENOL) 500 MG tablet Take 500-1,000 mg by mouth as needed for mild pain.     Marland Kitchen apixaban (ELIQUIS) 5 MG TABS tablet Take 1 tablet (5 mg total) by mouth 2 (two) times daily. 60 tablet 5  . cetirizine (ZYRTEC) 10 MG tablet Take 10 mg by mouth daily.    . digoxin (LANOXIN) 0.125 MG tablet Take 1 tablet (0.125 mg total) by mouth daily. 90 tablet 3  . famotidine (PEPCID) 20 MG tablet One at bedtime (Patient taking differently: Take 20 mg by mouth at bedtime. ) 30 tablet 2  . flintstones complete  (FLINTSTONES) 60 MG chewable tablet Chew 3 tablets by mouth daily.    . Fluticasone Furoate-Vilanterol 200-25 MCG/INH AEPB Inhale 1 puff into the lungs daily.    Marland Kitchen levalbuterol (XOPENEX) 0.63 MG/3ML nebulizer solution Take 3 mLs (0.63 mg total) by nebulization every 6 (six) hours as needed for wheezing or shortness of breath. 90 mL 12  . levothyroxine (SYNTHROID, LEVOTHROID) 137 MCG tablet Take 137 mcg by mouth daily before breakfast.    . losartan (COZAAR) 100 MG tablet Take 100 mg by mouth daily.    Marland Kitchen omeprazole (PRILOSEC) 20 MG capsule Take 1 capsule (20 mg total) by mouth daily. 90 capsule 3  . potassium chloride SA (K-DUR,KLOR-CON) 20 MEQ tablet Take 1 tablet (20 mEq total) by mouth daily. 30 tablet 6  . torsemide (DEMADEX) 20 MG tablet Take 20 mg by mouth. Alternates 20 mg,with 40 mg daily  12  . VOLTAREN 1 % GEL Apply 1 application topically daily as needed.  0  . diltiazem (CARDIZEM CD) 120 MG 24 hr capsule Take 1 capsule (120 mg total) by mouth daily. 2 every AM then 1 PM (Patient taking differently: Take 120 mg by mouth daily. Dose/instruction change 12/02/14: Take 1 capsule (120 mg) by mouth daily) 90 capsule 12   No current facility-administered medications for this visit.    Past Medical History  Diagnosis Date  . Thyroid condition   . Hypertension   . GERD (gastroesophageal reflux  disease)   . COPD (chronic obstructive pulmonary disease)   . Bronchitis   . Barrett esophagus     Past Surgical History  Procedure Laterality Date  . Cholecystectomy    . Colonoscopy    . Upper gastrointestinal endoscopy    . Right breast biopsy  Sept, 2012    History   Social History  . Marital Status: Widowed    Spouse Name: N/A  . Number of Children: N/A  . Years of Education: N/A   Occupational History  . Not on file.   Social History Main Topics  . Smoking status: Former Smoker -- 0.50 packs/day for 30 years    Types: Cigarettes    Start date: 09/24/1958    Quit date:  11/13/1999  . Smokeless tobacco: Never Used  . Alcohol Use: No  . Drug Use: No  . Sexual Activity: Not on file   Other Topics Concern  . Not on file   Social History Narrative     Filed Vitals:   01/24/15 1258  BP: 130/56  Pulse: 93  Height: 5\' 1"  (1.549 m)  Weight: 227 lb (102.967 kg)   Wt on 3/23: 231 lbs  PHYSICAL EXAM General: NAD HEENT: Normal. Neck: No JVD, no thyromegaly. Lungs: Diminished b/l, no rales or wheezes. CV: Nondisplaced PMI. Regular rate and rhythm, normal S1/S2, no S3/S4, no murmur. 1+pretibial and periankle edema of the b/l LE's, wearing compression stockings.  Abdomen: Soft, nontender,obese.  Neurologic: Alert and oriented x 3.  Psych: Normal affect. Musculoskeletal: Normal range of motion. Extremities: No clubbing or cyanosis.   ECG: Most recent ECG reviewed.      ASSESSMENT AND PLAN: 1. Atrial fibrillation: Symptomatically stable and remains in a regular rhythm. Currently on long-acting diltiazem 120 mg daily, and tolerating Eliquis for anticoagulation without difficulties. I will disccontinue digoxin given CKD. Diltiazem may need to be increased at her f/u appt with PCP in June.  2. Chronic diastolic heart failure: Stable on torsemide 40 mg and 20 mg on alternate days. Lower extremity swelling also due to chronic venous insufficiency due to obesity. I had a long discussion with her about getting activity perhaps in a pool which will limit joint impact. Will reduce KCl to 20 meq every other day.  3. Essential hypertension: Well controlled on current therapy. No changes.  4. COPD with recent exacerbation: Stable.  Dispo: f/u in August.  Prentice DockerSuresh Iona Stay, M.D., F.A.C.C.

## 2015-01-27 ENCOUNTER — Ambulatory Visit (INDEPENDENT_AMBULATORY_CARE_PROVIDER_SITE_OTHER): Payer: Medicare Other | Admitting: Internal Medicine

## 2015-01-27 ENCOUNTER — Encounter (INDEPENDENT_AMBULATORY_CARE_PROVIDER_SITE_OTHER): Payer: Self-pay | Admitting: Internal Medicine

## 2015-01-27 VITALS — BP 132/40 | HR 80 | Temp 98.0°F | Ht 61.5 in | Wt 228.9 lb

## 2015-01-27 DIAGNOSIS — R748 Abnormal levels of other serum enzymes: Secondary | ICD-10-CM

## 2015-01-27 DIAGNOSIS — R197 Diarrhea, unspecified: Secondary | ICD-10-CM | POA: Diagnosis not present

## 2015-01-27 DIAGNOSIS — K219 Gastro-esophageal reflux disease without esophagitis: Secondary | ICD-10-CM

## 2015-01-27 DIAGNOSIS — K2270 Barrett's esophagus without dysplasia: Secondary | ICD-10-CM | POA: Diagnosis not present

## 2015-01-27 DIAGNOSIS — R160 Hepatomegaly, not elsewhere classified: Secondary | ICD-10-CM

## 2015-01-27 NOTE — Patient Instructions (Addendum)
Continue the Probiotic. Imodium as needed. Please fax labs. OV in 1 year. CMET, AFP, CT abdomen/pelvis with CM

## 2015-01-27 NOTE — Progress Notes (Addendum)
Subjective:    Patient ID: Yolanda MurrainBrenda W Rutten, female    DOB: 24-Oct-1940, 74 y.o.   MRN: [id number]  HPI Here for one year follow up. Hx of iron deficiency anemia and GERD. Hx of short segment Barrett's esophagus. Her last EGD was in 2012 and was negative for dysplasia. Her acid reflux is controlled with PPIs. She tells me she was on Doxycycline for a cellulitis in her legs. She says she was on the Doxy for about 10 days. On Doxy in March.  She says she lost her appetite and she says she had urgency with her stools.  Stools were loose sometimes.  Sometimes her stools were explosive.  She is having 2 stools a day. Sometimes she will have more stools depending on what she eats.  There is no abdominal pain except urgency. Her appetite is not good. Her last weight last year was 217. Today her weight is 228.9. She was in the hospital in February for COPD excerbation and she was in atrial fib. Home health is seeing patient now due to fluid retention/cellulitis of her lower extremities.     Patient's last colonoscopy was in December 2010 which reveal normal colonoscopy except for small external hemorrhoids and a single tiny AVM at the ascending colon.    CBC    Component Value Date/Time   WBC 9.7 11/11/2014 0454   RBC 3.67* 11/11/2014 0454   HGB 10.9* 11/11/2014 0454   HCT 34.0* 11/11/2014 0454   PLT 222 11/11/2014 0454   MCV 92.6 11/11/2014 0454   MCH 29.7 11/11/2014 0454   MCHC 32.1 11/11/2014 0454   RDW 14.0 11/11/2014 0454   LYMPHSABS 0.6* 11/11/2014 0454   MONOABS 0.3 11/11/2014 0454   EOSABS 0.0 11/11/2014 0454   BASOSABS 0.0 11/11/2014 0454       Review of Systems Widowed. Two step children. Retired [employer] Past Medical History  Diagnosis Date  . Thyroid condition   . Hypertension   . GERD (gastroesophageal reflux disease)   . COPD (chronic obstructive pulmonary disease)   . Bronchitis   . Barrett esophagus   . CHF (congestive heart failure)   . Atrial  fibrillation     Past Surgical History  Procedure Laterality Date  . Cholecystectomy    . Colonoscopy    . Upper gastrointestinal endoscopy    . Right breast biopsy  Sept, 2012    Allergies  Allergen Reactions  . Ciprofloxacin Shortness Of Breath    REACTION: sob,tachycardia  . Doxycycline Nausea Only    Also experienced diarrhea   . Fish Oil     Patient face drew to the side,Bells Palsey  . Guaifenesin Shortness Of Breath    REACTION: sob,tachycardia  . Sulfamethoxazole W/Trimethoprim 800-160 [Sulfamethoxazole-Trimethoprim] Nausea Only    Also lack of appetite   . Adhesive [Tape] Other (See Comments)    Causes blisters on skin  . Tramadol     insomnia    Current Outpatient Prescriptions on File Prior to Visit  Medication Sig Dispense Refill  . acetaminophen (TYLENOL) 500 MG tablet Take 500-1,000 mg by mouth as needed for mild pain.     Marland Kitchen. apixaban (ELIQUIS) 5 MG TABS tablet Take 1 tablet (5 mg total) by mouth 2 (two) times daily. 60 tablet 5  . cetirizine (ZYRTEC) 10 MG tablet Take 10 mg by mouth daily.    Marland Kitchen. diltiazem (CARDIZEM CD) 120 MG 24 hr capsule Take 1 capsule (120 mg total) by mouth daily. 90  capsule 3  . famotidine (PEPCID) 20 MG tablet One at bedtime (Patient taking differently: Take 20 mg by mouth at bedtime. ) 30 tablet 2  . flintstones complete (FLINTSTONES) 60 MG chewable tablet Chew 3 tablets by mouth daily.    . Fluticasone Furoate-Vilanterol 200-25 MCG/INH AEPB Inhale 1 puff into the lungs daily.    Marland Kitchen. levalbuterol (XOPENEX) 0.63 MG/3ML nebulizer solution Take 3 mLs (0.63 mg total) by nebulization every 6 (six) hours as needed for wheezing or shortness of breath. 90 mL 12  . levothyroxine (SYNTHROID, LEVOTHROID) 137 MCG tablet Take 137 mcg by mouth daily before breakfast.    . losartan (COZAAR) 100 MG tablet Take 100 mg by mouth daily.    Marland Kitchen. omeprazole (PRILOSEC) 20 MG capsule Take 1 capsule (20 mg total) by mouth daily. 90 capsule 3  . potassium chloride SA  (K-DUR,KLOR-CON) 20 MEQ tablet Take 1 tablet (20 mEq total) by mouth every other day. 30 tablet 6  . torsemide (DEMADEX) 20 MG tablet Take 20 mg by mouth. Alternates 20 mg,with 40 mg daily  12  . VOLTAREN 1 % GEL Apply 1 application topically daily as needed.  0   No current facility-administered medications on file prior to visit.        Objective:   Physical Exam Blood pressure 132/40, pulse 80, temperature 98 F (36.7 C), height 5' 1.5" (1.562 m), weight 228 lb 14.4 oz (103.828 kg).   Alert and oriented. Skin warm and dry. Oral mucosa is moist.   . Sclera anicteric, conjunctivae is pink. Thyroid not enlarged. No cervical lymphadenopathy. Lungs clear. Heart regular rate and rhythm.  Abdomen is soft. Bowel sounds are positive. Liver  4 fingerbreaths below lt costal margin. No abdominal masses felt. No tenderness.  Cellulitis to lower extremities. 3+ edema.      Assessment & Plan:  GERD controlled at this time. Barrett's esophagus.  Last EGD in 2012.  She will be due for an EGD in 2017.  Hepatomegaly: ? Etiology with elevated liver enzymes. Will get CMET, AFP, CT abdomen with CM  May try the Imodium for loose stools.

## 2015-01-27 NOTE — Addendum Note (Signed)
Addended by: Len BlalockSETZER, TERRI L on: 01/27/2015 02:04 PM   Modules accepted: Orders

## 2015-01-28 ENCOUNTER — Encounter (INDEPENDENT_AMBULATORY_CARE_PROVIDER_SITE_OTHER): Payer: Self-pay

## 2015-01-28 LAB — COMPREHENSIVE METABOLIC PANEL
ALK PHOS: 54 U/L (ref 39–117)
ALT: 45 U/L — ABNORMAL HIGH (ref 0–35)
AST: 65 U/L — ABNORMAL HIGH (ref 0–37)
Albumin: 3.7 g/dL (ref 3.5–5.2)
BUN: 34 mg/dL — ABNORMAL HIGH (ref 6–23)
CALCIUM: 9.3 mg/dL (ref 8.4–10.5)
CO2: 20 meq/L (ref 19–32)
CREATININE: 1.55 mg/dL — AB (ref 0.50–1.10)
Chloride: 101 mEq/L (ref 96–112)
Glucose, Bld: 230 mg/dL — ABNORMAL HIGH (ref 70–99)
Potassium: 5 mEq/L (ref 3.5–5.3)
Sodium: 137 mEq/L (ref 135–145)
Total Bilirubin: 0.5 mg/dL (ref 0.2–1.2)
Total Protein: 6.3 g/dL (ref 6.0–8.3)

## 2015-01-28 LAB — AFP TUMOR MARKER: AFP-Tumor Marker: 6.1 ng/mL — ABNORMAL HIGH (ref <6.1)

## 2015-02-02 ENCOUNTER — Encounter (HOSPITAL_COMMUNITY): Payer: Self-pay

## 2015-02-02 ENCOUNTER — Ambulatory Visit (HOSPITAL_COMMUNITY)
Admission: RE | Admit: 2015-02-02 | Discharge: 2015-02-02 | Disposition: A | Discharge status: Home / Self Care | Payer: Medicare Other | Source: Ambulatory Visit | Attending: Internal Medicine | Admitting: Internal Medicine

## 2015-02-02 DIAGNOSIS — R160 Hepatomegaly, not elsewhere classified: Secondary | ICD-10-CM | POA: Insufficient documentation

## 2015-02-02 DIAGNOSIS — K769 Liver disease, unspecified: Secondary | ICD-10-CM | POA: Diagnosis not present

## 2015-02-02 DIAGNOSIS — Z9049 Acquired absence of other specified parts of digestive tract: Secondary | ICD-10-CM | POA: Insufficient documentation

## 2015-02-02 DIAGNOSIS — R748 Abnormal levels of other serum enzymes: Secondary | ICD-10-CM

## 2015-02-02 DIAGNOSIS — R1900 Intra-abdominal and pelvic swelling, mass and lump, unspecified site: Secondary | ICD-10-CM | POA: Diagnosis present

## 2015-02-02 DIAGNOSIS — K760 Fatty (change of) liver, not elsewhere classified: Secondary | ICD-10-CM | POA: Insufficient documentation

## 2015-02-02 MED ORDER — IOHEXOL 300 MG/ML  SOLN
100.0000 mL | Freq: Once | INTRAMUSCULAR | Status: AC | PRN
  Administered 2015-02-02: 80 mL via INTRAVENOUS

## 2015-02-04 ENCOUNTER — Encounter (INDEPENDENT_AMBULATORY_CARE_PROVIDER_SITE_OTHER): Payer: Self-pay | Admitting: Internal Medicine

## 2015-02-08 ENCOUNTER — Telehealth (INDEPENDENT_AMBULATORY_CARE_PROVIDER_SITE_OTHER): Payer: Self-pay | Admitting: *Deleted

## 2015-02-08 DIAGNOSIS — R160 Hepatomegaly, not elsewhere classified: Secondary | ICD-10-CM

## 2015-02-08 DIAGNOSIS — K760 Fatty (change of) liver, not elsewhere classified: Secondary | ICD-10-CM

## 2015-02-08 DIAGNOSIS — D508 Other iron deficiency anemias: Secondary | ICD-10-CM

## 2015-02-08 NOTE — Telephone Encounter (Signed)
This was forwarded to Dr.Rehman to address.

## 2015-02-08 NOTE — Telephone Encounter (Signed)
Yolanda [name] seen Yolanda [name] and she order a CT. Yolanda [name] did call and LM stating her Liver was enlarged with a lesion. Yolanda [name] has given it some though and would like to ask Dr. Karilyn Cotaehman about it.  Is there a need to have a biopsy and how is it done? She also hasn't had a good blood work up either. She has been on 17 different medication in past, could this had any effect on her liver and/or could the damage be reverse? Can she do anything to help this at all? Her return phone number is [phone].

## 2015-02-08 NOTE — Telephone Encounter (Signed)
Patient will need CBC now. He will need CBC, LFTs, AFP and MRI liver in 3 months.

## 2015-02-09 NOTE — Telephone Encounter (Signed)
Patient was called and made aware of Dr.Rehman's recommendation. Ernesto RutherfordLinda Walker , nurse with [facility] , will drawn the CBC on 02/10/[id number]. I gave her a verbal order for this,she will make sure that we get results.  A order will be sone for the following labs in 3 months - CBC,LFT, AFP, and a MRI of LIver.

## 2015-02-09 NOTE — Telephone Encounter (Signed)
6 month MRI noted in recall 

## 2015-02-15 ENCOUNTER — Encounter (INDEPENDENT_AMBULATORY_CARE_PROVIDER_SITE_OTHER): Payer: Self-pay

## 2015-02-15 ENCOUNTER — Telehealth (INDEPENDENT_AMBULATORY_CARE_PROVIDER_SITE_OTHER): Payer: Self-pay | Admitting: *Deleted

## 2015-02-15 DIAGNOSIS — K2270 Barrett's esophagus without dysplasia: Secondary | ICD-10-CM

## 2015-02-15 DIAGNOSIS — D508 Other iron deficiency anemias: Secondary | ICD-10-CM

## 2015-02-15 NOTE — Telephone Encounter (Signed)
Per Dr.Rehman the patient will need to have labs drawn in 3 months 

## 2015-02-18 ENCOUNTER — Other Ambulatory Visit: Payer: Self-pay | Admitting: *Deleted

## 2015-02-18 MED ORDER — POTASSIUM CHLORIDE CRYS ER 20 MEQ PO TBCR: 20.0000 meq | EXTENDED_RELEASE_TABLET | Freq: Every day | ORAL | Status: DC

## 2015-02-24 ENCOUNTER — Telehealth: Payer: Self-pay | Admitting: Cardiovascular Disease

## 2015-02-24 NOTE — Telephone Encounter (Signed)
pls call concerning meds/hair loss

## 2015-02-24 NOTE — Telephone Encounter (Signed)
Has had increase hair loss this year,worried she is on too much medicine. She will also touch with pcp regarding her thyroid medicine

## 2015-02-25 NOTE — Telephone Encounter (Signed)
Cardiac meds are Eliquis, diltiazem, losartan, and torsemide. Does she remember when hair started falling out? Can she correlate it with the institution of any of these meds?  Dr. Juanetta GoslingHawkins switched Lasix to torsemide. Did it happen after this?  Could have pharmacy run each of her meds to see which may be the most likely culprit.

## 2015-02-25 NOTE — Telephone Encounter (Signed)
She said this has been going on for a long time.Her sister has same issue and all bothers are bald. I will check with pharmacy to see if any meds could be the culprit  [facility] the only medication she said that could be of issue is levothyroxine and also familial history

## 2015-03-21 ENCOUNTER — Other Ambulatory Visit: Payer: Self-pay

## 2015-04-20 ENCOUNTER — Encounter (INDEPENDENT_AMBULATORY_CARE_PROVIDER_SITE_OTHER): Payer: Self-pay | Admitting: Internal Medicine

## 2015-04-20 ENCOUNTER — Encounter (INDEPENDENT_AMBULATORY_CARE_PROVIDER_SITE_OTHER): Payer: Self-pay | Admitting: *Deleted

## 2015-04-20 ENCOUNTER — Other Ambulatory Visit (INDEPENDENT_AMBULATORY_CARE_PROVIDER_SITE_OTHER): Payer: Self-pay | Admitting: *Deleted

## 2015-04-20 DIAGNOSIS — K760 Fatty (change of) liver, not elsewhere classified: Secondary | ICD-10-CM

## 2015-04-20 DIAGNOSIS — R160 Hepatomegaly, not elsewhere classified: Secondary | ICD-10-CM

## 2015-04-20 DIAGNOSIS — D508 Other iron deficiency anemias: Secondary | ICD-10-CM

## 2015-04-20 DIAGNOSIS — K2270 Barrett's esophagus without dysplasia: Secondary | ICD-10-CM

## 2015-04-21 ENCOUNTER — Other Ambulatory Visit (INDEPENDENT_AMBULATORY_CARE_PROVIDER_SITE_OTHER): Payer: Self-pay | Admitting: Internal Medicine

## 2015-04-21 DIAGNOSIS — R160 Hepatomegaly, not elsewhere classified: Secondary | ICD-10-CM

## 2015-04-21 DIAGNOSIS — K769 Liver disease, unspecified: Secondary | ICD-10-CM

## 2015-05-03 ENCOUNTER — Ambulatory Visit (HOSPITAL_COMMUNITY)
Admission: RE | Admit: 2015-05-03 | Discharge: 2015-05-03 | Disposition: A | Discharge status: Home / Self Care | Payer: Medicare Other | Source: Ambulatory Visit | Attending: Internal Medicine | Admitting: Internal Medicine

## 2015-05-03 DIAGNOSIS — R945 Abnormal results of liver function studies: Secondary | ICD-10-CM | POA: Diagnosis present

## 2015-05-03 DIAGNOSIS — R160 Hepatomegaly, not elsewhere classified: Secondary | ICD-10-CM | POA: Insufficient documentation

## 2015-05-03 DIAGNOSIS — K760 Fatty (change of) liver, not elsewhere classified: Secondary | ICD-10-CM | POA: Diagnosis not present

## 2015-05-03 DIAGNOSIS — K769 Liver disease, unspecified: Secondary | ICD-10-CM

## 2015-05-03 LAB — POCT I-STAT CREATININE: Creatinine, Ser: 1.6 mg/dL — ABNORMAL HIGH (ref 0.44–1.00)

## 2015-05-03 MED ORDER — GADOXETATE DISODIUM 0.25 MMOL/ML IV SOLN
10.0000 mL | Freq: Once | INTRAVENOUS | Status: AC | PRN
  Administered 2015-05-03: 10 mL via INTRAVENOUS

## 2015-05-04 LAB — CBC
HCT: 31.4 % — ABNORMAL LOW (ref 36.0–46.0)
HEMOGLOBIN: 10.5 g/dL — AB (ref 12.0–15.0)
MCH: 30.3 pg (ref 26.0–34.0)
MCHC: 33.4 g/dL (ref 30.0–36.0)
MCV: 90.5 fL (ref 78.0–100.0)
MPV: 9.3 fL (ref 8.6–12.4)
Platelets: 212 10*3/uL (ref 150–400)
RBC: 3.47 MIL/uL — ABNORMAL LOW (ref 3.87–5.11)
RDW: 14.6 % (ref 11.5–15.5)
WBC: 7 10*3/uL (ref 4.0–10.5)

## 2015-05-04 LAB — HEPATIC FUNCTION PANEL
ALBUMIN: 3.7 g/dL (ref 3.6–5.1)
ALT: 37 U/L — ABNORMAL HIGH (ref 6–29)
AST: 43 U/L — ABNORMAL HIGH (ref 10–35)
Alkaline Phosphatase: 69 U/L (ref 33–130)
BILIRUBIN TOTAL: 0.5 mg/dL (ref 0.2–1.2)
Bilirubin, Direct: 0.1 mg/dL (ref <=0.2)
Indirect Bilirubin: 0.4 mg/dL (ref 0.2–1.2)
Total Protein: 6.3 g/dL (ref 6.1–8.1)

## 2015-05-04 LAB — AFP TUMOR MARKER: AFP TUMOR MARKER: 4.4 ng/mL (ref <6.1)

## 2015-05-09 ENCOUNTER — Telehealth (INDEPENDENT_AMBULATORY_CARE_PROVIDER_SITE_OTHER): Payer: Self-pay | Admitting: *Deleted

## 2015-05-09 DIAGNOSIS — K760 Fatty (change of) liver, not elsewhere classified: Secondary | ICD-10-CM

## 2015-05-09 DIAGNOSIS — R160 Hepatomegaly, not elsewhere classified: Secondary | ICD-10-CM

## 2015-05-09 NOTE — Telephone Encounter (Signed)
Per Dr.Rehman the patient is to have CBC in 1 month, Hepatic Profile in 6 months,AFP in 6 months.

## 2015-05-11 ENCOUNTER — Encounter: Payer: Self-pay | Admitting: Cardiovascular Disease

## 2015-05-11 ENCOUNTER — Ambulatory Visit (INDEPENDENT_AMBULATORY_CARE_PROVIDER_SITE_OTHER): Payer: Medicare Other | Admitting: Cardiovascular Disease

## 2015-05-11 VITALS — BP 118/58 | HR 87 | Ht 61.0 in | Wt 211.0 lb

## 2015-05-11 DIAGNOSIS — R600 Localized edema: Secondary | ICD-10-CM | POA: Diagnosis not present

## 2015-05-11 DIAGNOSIS — I10 Essential (primary) hypertension: Secondary | ICD-10-CM | POA: Diagnosis not present

## 2015-05-11 DIAGNOSIS — N183 Chronic kidney disease, stage 3 unspecified: Secondary | ICD-10-CM

## 2015-05-11 DIAGNOSIS — I5032 Chronic diastolic (congestive) heart failure: Secondary | ICD-10-CM

## 2015-05-11 DIAGNOSIS — I480 Paroxysmal atrial fibrillation: Secondary | ICD-10-CM

## 2015-05-11 NOTE — Patient Instructions (Signed)
Continue all current medications. Your physician wants you to follow up in: 6 months.  You will receive a reminder letter in the mail one-two months in advance.  If you don't receive a letter, please call our office to schedule the follow up appointment   

## 2015-05-11 NOTE — Progress Notes (Signed)
Patient ID: Yolanda [name], female   DOB: 24-May-1941, 74 y.o.   MRN: [id number]      SUBJECTIVE: The patient presents for routine follow-up for atrial fibrillation and diastolic heart failure. She also has chronic lower extremity swelling, likely related to venous stasis disease due to obesity.  She is doing very well today. She denies chest pain and shortness of breath. She feels the torsemide helped significantly with lower extremity swelling. She has been dealing with some left Achilles tendinitis and occasional foot cramping but no exertional claudication. She has lost 16 lbs since last visit on 01/24/15.   Review of Systems: As per "subjective", otherwise negative.  Allergies  Allergen Reactions  . Ciprofloxacin Shortness Of Breath    REACTION: sob,tachycardia  . Doxycycline Nausea Only    Also experienced diarrhea   . Fish Oil     Patient face drew to the side,Bells Palsey  . Guaifenesin Shortness Of Breath    REACTION: sob,tachycardia  . Sulfamethoxazole W/Trimethoprim 800-160 [Sulfamethoxazole-Trimethoprim] Nausea Only    Also lack of appetite   . Adhesive [Tape] Other (See Comments)    Causes blisters on skin  . Tramadol     insomnia    Current Outpatient Prescriptions  Medication Sig Dispense Refill  . acetaminophen (TYLENOL) 500 MG tablet Take 500-1,000 mg by mouth as needed for mild pain.     Marland Kitchen apixaban (ELIQUIS) 5 MG TABS tablet Take 1 tablet (5 mg total) by mouth 2 (two) times daily. 60 tablet 5  . cetirizine (ZYRTEC) 10 MG tablet Take 10 mg by mouth daily.    Marland Kitchen diltiazem (CARDIZEM CD) 120 MG 24 hr capsule Take 1 capsule (120 mg total) by mouth daily. 90 capsule 3  . famotidine (PEPCID) 20 MG tablet One at bedtime (Patient taking differently: Take 20 mg by mouth at bedtime. ) 30 tablet 2  . flintstones complete (FLINTSTONES) 60 MG chewable tablet Chew 3 tablets by mouth daily.    . Fluticasone Furoate-Vilanterol 200-25 MCG/INH AEPB Inhale 1 puff into the lungs daily.     Marland Kitchen gabapentin (NEURONTIN) 300 MG capsule Take 300 mg by mouth at bedtime. 1-2 tabs at bedtime restless leg syndrome    . levalbuterol (XOPENEX) 0.63 MG/3ML nebulizer solution Take 3 mLs (0.63 mg total) by nebulization every 6 (six) hours as needed for wheezing or shortness of breath. 90 mL 12  . levothyroxine (SYNTHROID, LEVOTHROID) 137 MCG tablet Take 137 mcg by mouth daily before breakfast.    . losartan (COZAAR) 100 MG tablet Take 100 mg by mouth daily.    Marland Kitchen omeprazole (PRILOSEC) 20 MG capsule Take 1 capsule (20 mg total) by mouth daily. 90 capsule 3  . potassium chloride SA (K-DUR,KLOR-CON) 20 MEQ tablet Take 1 tablet (20 mEq total) by mouth daily. 30 tablet 6  . torsemide (DEMADEX) 20 MG tablet Take 20 mg by mouth. Alternates 20 mg,with 40 mg daily  12  . VOLTAREN 1 % GEL Apply 1 application topically daily as needed.  0   No current facility-administered medications for this visit.    Past Medical History  Diagnosis Date  . Thyroid condition   . Hypertension   . GERD (gastroesophageal reflux disease)   . COPD (chronic obstructive pulmonary disease)   . Bronchitis   . Barrett esophagus   . CHF (congestive heart failure)   . Atrial fibrillation     Past Surgical History  Procedure Laterality Date  . Cholecystectomy    . Colonoscopy    .  Upper gastrointestinal endoscopy    . Right breast biopsy  Sept, 2012    Social History   Social History  . Marital Status: Widowed    Spouse Name: N/A  . Number of Children: N/A  . Years of Education: N/A   Occupational History  . Not on file.   Social History Main Topics  . Smoking status: Former Smoker -- 0.50 packs/day for 30 years    Types: Cigarettes    Start date: 09/24/1958    Quit date: 11/13/1999  . Smokeless tobacco: Never Used  . Alcohol Use: No  . Drug Use: No  . Sexual Activity: Not on file   Other Topics Concern  . Not on file   Social History Narrative     Filed Vitals:   05/11/15 0957  BP: 118/58    Pulse: 87  Height:  (1.549 m)  Weight: 211 lb (95.709 kg)  SpO2: 95%    PHYSICAL EXAM General: NAD HEENT: Normal. Neck: No JVD, no thyromegaly. Lungs: Clear b/l. CV: Nondisplaced PMI. Regular rate and rhythm, normal S1/S2, no S3/S4, no murmur. No pretibial edema. Abdomen: Soft, nontender,obese.  Neurologic: Alert and oriented x 3.  Psych: Normal affect. Musculoskeletal: Normal range of motion. Extremities: No clubbing or cyanosis.   ECG: Most recent ECG reviewed.      ASSESSMENT AND PLAN: 1. Atrial fibrillation: Symptomatically stable and remains in a regular rhythm. Currently on long-acting diltiazem 120 mg daily, and tolerating Eliquis for anticoagulation without difficulties.   2. Chronic diastolic heart failure: Stable on torsemide 40 mg and 20 mg on alternate days. Lower extremity swelling also due to chronic venous insufficiency due to obesity.   3. Essential hypertension: Well controlled on current therapy. No changes.  4. COPD: Stable.  Dispo: f/u 6 months.   Prentice Docker, M.D., F.A.C.C.

## 2015-05-17 ENCOUNTER — Other Ambulatory Visit (INDEPENDENT_AMBULATORY_CARE_PROVIDER_SITE_OTHER): Payer: Self-pay | Admitting: *Deleted

## 2015-05-17 ENCOUNTER — Encounter (INDEPENDENT_AMBULATORY_CARE_PROVIDER_SITE_OTHER): Payer: Self-pay | Admitting: *Deleted

## 2015-05-17 DIAGNOSIS — K760 Fatty (change of) liver, not elsewhere classified: Secondary | ICD-10-CM

## 2015-05-17 DIAGNOSIS — R160 Hepatomegaly, not elsewhere classified: Secondary | ICD-10-CM

## 2015-06-10 LAB — CBC
HCT: 30.4 % — ABNORMAL LOW (ref 36.0–46.0)
HEMOGLOBIN: 9.8 g/dL — AB (ref 12.0–15.0)
MCH: 30.1 pg (ref 26.0–34.0)
MCHC: 32.2 g/dL (ref 30.0–36.0)
MCV: 93.3 fL (ref 78.0–100.0)
MPV: 9.3 fL (ref 8.6–12.4)
Platelets: 245 10*3/uL (ref 150–400)
RBC: 3.26 MIL/uL — AB (ref 3.87–5.11)
RDW: 14.1 % (ref 11.5–15.5)
WBC: 9.1 10*3/uL (ref 4.0–10.5)

## 2015-06-11 LAB — HEPATIC FUNCTION PANEL
ALT: 35 U/L — ABNORMAL HIGH (ref 6–29)
AST: 33 U/L (ref 10–35)
Albumin: 3.6 g/dL (ref 3.6–5.1)
Alkaline Phosphatase: 65 U/L (ref 33–130)
BILIRUBIN DIRECT: 0.1 mg/dL (ref <=0.2)
BILIRUBIN INDIRECT: 0.4 mg/dL (ref 0.2–1.2)
Total Bilirubin: 0.5 mg/dL (ref 0.2–1.2)
Total Protein: 5.9 g/dL — ABNORMAL LOW (ref 6.1–8.1)

## 2015-06-11 LAB — AFP TUMOR MARKER: AFP-Tumor Marker: 4.1 ng/mL (ref <6.1)

## 2015-06-20 ENCOUNTER — Encounter (INDEPENDENT_AMBULATORY_CARE_PROVIDER_SITE_OTHER): Payer: Self-pay | Admitting: *Deleted

## 2015-06-20 ENCOUNTER — Telehealth (INDEPENDENT_AMBULATORY_CARE_PROVIDER_SITE_OTHER): Payer: Self-pay | Admitting: *Deleted

## 2015-06-20 DIAGNOSIS — K2270 Barrett's esophagus without dysplasia: Secondary | ICD-10-CM

## 2015-06-20 DIAGNOSIS — D509 Iron deficiency anemia, unspecified: Secondary | ICD-10-CM

## 2015-06-20 NOTE — Telephone Encounter (Signed)
Per Dr.Rehman the patient will need to have labs drawn patient is to have labs drawn in 1 month.

## 2015-07-19 LAB — IRON AND TIBC
%SAT: 28 % (ref 11–50)
Iron: 104 ug/dL (ref 45–160)
TIBC: 365 ug/dL (ref 250–450)
UIBC: 261 ug/dL (ref 125–400)

## 2015-07-19 LAB — FERRITIN: Ferritin: 102 ng/mL (ref 10–291)

## 2015-07-19 LAB — HEMOGLOBIN AND HEMATOCRIT, BLOOD
HCT: 34.7 % — ABNORMAL LOW (ref 36.0–46.0)
Hemoglobin: 11.2 g/dL — ABNORMAL LOW (ref 12.0–15.0)

## 2015-07-19 LAB — IRON: Iron: 109 ug/dL (ref 45–160)

## 2015-07-25 ENCOUNTER — Telehealth (INDEPENDENT_AMBULATORY_CARE_PROVIDER_SITE_OTHER): Payer: Self-pay | Admitting: *Deleted

## 2015-07-25 DIAGNOSIS — K2270 Barrett's esophagus without dysplasia: Secondary | ICD-10-CM

## 2015-07-25 NOTE — Telephone Encounter (Signed)
Per Dr.Rehman the patient will need to have labs drawn prior to her OV in May 2017.

## 2015-07-25 NOTE — Addendum Note (Signed)
Addended by: Shona NeedlesDD, Kion Huntsberry M on: 07/25/2015 12:37 PM   Modules accepted: Orders

## 2015-10-11 ENCOUNTER — Encounter (INDEPENDENT_AMBULATORY_CARE_PROVIDER_SITE_OTHER): Payer: Self-pay | Admitting: Internal Medicine

## 2015-10-26 ENCOUNTER — Other Ambulatory Visit (HOSPITAL_COMMUNITY): Payer: Self-pay | Admitting: Pulmonary Disease

## 2015-10-26 ENCOUNTER — Ambulatory Visit (HOSPITAL_COMMUNITY)
Admission: RE | Admit: 2015-10-26 | Discharge: 2015-10-26 | Disposition: A | Discharge status: Home / Self Care | Payer: Medicare Other | Source: Ambulatory Visit | Attending: Pulmonary Disease | Admitting: Pulmonary Disease

## 2015-10-26 DIAGNOSIS — M7731 Calcaneal spur, right foot: Secondary | ICD-10-CM | POA: Insufficient documentation

## 2015-10-26 DIAGNOSIS — M7989 Other specified soft tissue disorders: Secondary | ICD-10-CM | POA: Insufficient documentation

## 2015-10-26 DIAGNOSIS — M79671 Pain in right foot: Secondary | ICD-10-CM

## 2015-11-07 ENCOUNTER — Ambulatory Visit (INDEPENDENT_AMBULATORY_CARE_PROVIDER_SITE_OTHER): Payer: Medicare Other | Admitting: Cardiovascular Disease

## 2015-11-07 ENCOUNTER — Encounter: Payer: Self-pay | Admitting: Cardiovascular Disease

## 2015-11-07 VITALS — BP 128/60 | HR 93 | Ht 62.0 in | Wt 225.0 lb

## 2015-11-07 DIAGNOSIS — I5032 Chronic diastolic (congestive) heart failure: Secondary | ICD-10-CM

## 2015-11-07 DIAGNOSIS — I10 Essential (primary) hypertension: Secondary | ICD-10-CM | POA: Diagnosis not present

## 2015-11-07 DIAGNOSIS — I480 Paroxysmal atrial fibrillation: Secondary | ICD-10-CM | POA: Diagnosis not present

## 2015-11-07 DIAGNOSIS — R600 Localized edema: Secondary | ICD-10-CM

## 2015-11-07 NOTE — Progress Notes (Signed)
Patient ID: Yolanda [name], female   DOB: October 14, 1940, 75 y.o.   MRN: [id number]      SUBJECTIVE: The patient presents for routine follow-up for atrial fibrillation and diastolic heart failure. She also has chronic lower extremity swelling, likely related to venous stasis disease due to obesity.  Feeling well. Chronic exertional dyspnea is stable. Has a right heel spur which is bothersome. Has occasional muscle cramps. Denies chest pain.  ECG performed in the office today demonstrates normal sinus rhythm with no ischemic ST segment or T-wave abnormalities, nor any arrhythmias.    Review of Systems: As per "subjective", otherwise negative.  Allergies  Allergen Reactions  . Ciprofloxacin Shortness Of Breath    REACTION: sob,tachycardia  . Doxycycline Nausea Only    Also experienced diarrhea   . Fish Oil     Patient face drew to the side,Bells Palsey  . Guaifenesin Shortness Of Breath    REACTION: sob,tachycardia  . Sulfamethoxazole W/Trimethoprim 800-160 [Sulfamethoxazole-Trimethoprim] Nausea Only    Also lack of appetite   . Adhesive [Tape] Other (See Comments)    Causes blisters on skin  . Tramadol     insomnia    Current Outpatient Prescriptions  Medication Sig Dispense Refill  . acetaminophen (TYLENOL) 500 MG tablet Take 500-1,000 mg by mouth as needed for mild pain.     Marland Kitchen apixaban (ELIQUIS) 5 MG TABS tablet Take 1 tablet (5 mg total) by mouth 2 (two) times daily. 60 tablet 5  . cetirizine (ZYRTEC) 10 MG tablet Take 10 mg by mouth daily.    Marland Kitchen diltiazem (CARDIZEM CD) 120 MG 24 hr capsule Take 1 capsule (120 mg total) by mouth daily. 90 capsule 3  . famotidine (PEPCID) 20 MG tablet One at bedtime (Patient taking differently: Take 20 mg by mouth at bedtime. ) 30 tablet 2  . flintstones complete (FLINTSTONES) 60 MG chewable tablet Chew 3 tablets by mouth daily.    . Fluticasone Furoate-Vilanterol 200-25 MCG/INH AEPB Inhale 1 puff into the lungs daily.    Marland Kitchen gabapentin (NEURONTIN)  300 MG capsule Take 300 mg by mouth at bedtime. 1-2 tabs at bedtime restless leg syndrome    . levalbuterol (XOPENEX) 0.63 MG/3ML nebulizer solution Take 3 mLs (0.63 mg total) by nebulization every 6 (six) hours as needed for wheezing or shortness of breath. 90 mL 12  . levothyroxine (SYNTHROID, LEVOTHROID) 137 MCG tablet Take 137 mcg by mouth daily before breakfast.    . losartan (COZAAR) 100 MG tablet Take 100 mg by mouth daily.    Marland Kitchen omeprazole (PRILOSEC) 20 MG capsule Take 1 capsule (20 mg total) by mouth daily. 90 capsule 3  . potassium chloride SA (K-DUR,KLOR-CON) 20 MEQ tablet Take 1 tablet (20 mEq total) by mouth daily. (Patient taking differently: Take 20 mEq by mouth every other day. ) 30 tablet 6  . torsemide (DEMADEX) 20 MG tablet Take 20 mg by mouth. Alternates 20 mg,with 40 mg daily  12  . VOLTAREN 1 % GEL Apply 1 application topically daily as needed.  0   No current facility-administered medications for this visit.    Past Medical History  Diagnosis Date  . Thyroid condition   . Hypertension   . GERD (gastroesophageal reflux disease)   . COPD (chronic obstructive pulmonary disease) (HCC)   . Bronchitis   . Barrett esophagus   . CHF (congestive heart failure) (HCC)   . Atrial fibrillation [facility])     Past Surgical History  Procedure Laterality Date  .  Cholecystectomy    . Colonoscopy    . Upper gastrointestinal endoscopy    . Right breast biopsy  Sept, 2012    Social History   Social History  . Marital Status: Widowed    Spouse Name: N/A  . Number of Children: N/A  . Years of Education: N/A   Occupational History  . Not on file.   Social History Main Topics  . Smoking status: Former Smoker -- 0.50 packs/day for 30 years    Types: Cigarettes    Start date: 09/24/1958    Quit date: 11/13/1999  . Smokeless tobacco: Never Used  . Alcohol Use: No  . Drug Use: No  . Sexual Activity: Not on file   Other Topics Concern  . Not on file   Social History  Narrative     Filed Vitals:   11/07/15 0818  BP: 128/60  Pulse: 93  Height:  (1.575 m)  Weight: 225 lb (102.059 kg)  SpO2: 95%    PHYSICAL EXAM General: NAD HEENT: Normal. Neck: No JVD, no thyromegaly. Lungs: Clear b/l. CV: Nondisplaced PMI. Regular rate and rhythm, normal S1/S2, no S3/S4, no murmur. No pretibial edema. Right foot boot noted. Abdomen: Soft, nontender,obese.  Neurologic: Alert and oriented x 3.  Psych: Normal affect. Musculoskeletal: Normal range of motion. Extremities: No clubbing or cyanosis.   ECG: Most recent ECG reviewed.      ASSESSMENT AND PLAN: 1. Atrial fibrillation: Symptomatically stable and remains in sinus rhythm. Currently on long-acting diltiazem 120 mg daily, and tolerating Eliquis for anticoagulation without difficulties.   2. Chronic diastolic heart failure: Stable on torsemide 40 mg and 20 mg on alternate days. Lower extremity swelling also due to chronic venous insufficiency due to obesity.   3. Essential hypertension: Well controlled on current therapy. No changes.   Dispo: f/u 1 year.   Prentice Docker, M.D., F.A.C.C.

## 2015-11-07 NOTE — Patient Instructions (Signed)
Continue all medications.   Your physician wants you to follow up in:  1 year.  You will receive a reminder letter in the mail one-two months in advance.  If you don't receive a letter, please call our office to schedule the follow up appointment   

## 2016-01-06 ENCOUNTER — Emergency Department (HOSPITAL_COMMUNITY): Payer: Medicare Other

## 2016-01-06 ENCOUNTER — Encounter (HOSPITAL_COMMUNITY): Payer: Self-pay | Admitting: Emergency Medicine

## 2016-01-06 ENCOUNTER — Inpatient Hospital Stay (HOSPITAL_COMMUNITY)
Admission: EM | Admit: 2016-01-06 | Discharge: 2016-01-13 | DRG: 291 | Disposition: A | Discharge status: Home Health | Payer: Medicare Other | Attending: Pulmonary Disease | Admitting: Pulmonary Disease

## 2016-01-06 DIAGNOSIS — F419 Anxiety disorder, unspecified: Secondary | ICD-10-CM | POA: Diagnosis present

## 2016-01-06 DIAGNOSIS — E871 Hypo-osmolality and hyponatremia: Secondary | ICD-10-CM | POA: Diagnosis present

## 2016-01-06 DIAGNOSIS — L03115 Cellulitis of right lower limb: Secondary | ICD-10-CM | POA: Diagnosis present

## 2016-01-06 DIAGNOSIS — I5033 Acute on chronic diastolic (congestive) heart failure: Secondary | ICD-10-CM | POA: Diagnosis present

## 2016-01-06 DIAGNOSIS — Z7951 Long term (current) use of inhaled steroids: Secondary | ICD-10-CM

## 2016-01-06 DIAGNOSIS — I480 Paroxysmal atrial fibrillation: Secondary | ICD-10-CM | POA: Diagnosis present

## 2016-01-06 DIAGNOSIS — N183 Chronic kidney disease, stage 3 (moderate): Secondary | ICD-10-CM | POA: Diagnosis present

## 2016-01-06 DIAGNOSIS — L03116 Cellulitis of left lower limb: Secondary | ICD-10-CM | POA: Diagnosis present

## 2016-01-06 DIAGNOSIS — Z87891 Personal history of nicotine dependence: Secondary | ICD-10-CM | POA: Diagnosis not present

## 2016-01-06 DIAGNOSIS — Z833 Family history of diabetes mellitus: Secondary | ICD-10-CM

## 2016-01-06 DIAGNOSIS — Z801 Family history of malignant neoplasm of trachea, bronchus and lung: Secondary | ICD-10-CM

## 2016-01-06 DIAGNOSIS — Z8249 Family history of ischemic heart disease and other diseases of the circulatory system: Secondary | ICD-10-CM

## 2016-01-06 DIAGNOSIS — I509 Heart failure, unspecified: Secondary | ICD-10-CM | POA: Diagnosis not present

## 2016-01-06 DIAGNOSIS — N179 Acute kidney failure, unspecified: Secondary | ICD-10-CM | POA: Diagnosis present

## 2016-01-06 DIAGNOSIS — Z7901 Long term (current) use of anticoagulants: Secondary | ICD-10-CM

## 2016-01-06 DIAGNOSIS — Z79899 Other long term (current) drug therapy: Secondary | ICD-10-CM | POA: Diagnosis not present

## 2016-01-06 DIAGNOSIS — I130 Hypertensive heart and chronic kidney disease with heart failure and stage 1 through stage 4 chronic kidney disease, or unspecified chronic kidney disease: Secondary | ICD-10-CM | POA: Diagnosis present

## 2016-01-06 DIAGNOSIS — I5032 Chronic diastolic (congestive) heart failure: Secondary | ICD-10-CM | POA: Diagnosis not present

## 2016-01-06 DIAGNOSIS — T502X5A Adverse effect of carbonic-anhydrase inhibitors, benzothiadiazides and other diuretics, initial encounter: Secondary | ICD-10-CM | POA: Diagnosis present

## 2016-01-06 DIAGNOSIS — N289 Disorder of kidney and ureter, unspecified: Secondary | ICD-10-CM

## 2016-01-06 DIAGNOSIS — K219 Gastro-esophageal reflux disease without esophagitis: Secondary | ICD-10-CM | POA: Diagnosis present

## 2016-01-06 DIAGNOSIS — E039 Hypothyroidism, unspecified: Secondary | ICD-10-CM | POA: Diagnosis present

## 2016-01-06 DIAGNOSIS — J449 Chronic obstructive pulmonary disease, unspecified: Secondary | ICD-10-CM | POA: Diagnosis present

## 2016-01-06 DIAGNOSIS — M7989 Other specified soft tissue disorders: Secondary | ICD-10-CM | POA: Diagnosis present

## 2016-01-06 HISTORY — DX: Unspecified diastolic (congestive) heart failure: I50.30

## 2016-01-06 HISTORY — DX: Essential (primary) hypertension: I10

## 2016-01-06 HISTORY — DX: Paroxysmal atrial fibrillation: I48.0

## 2016-01-06 HISTORY — DX: Hypothyroidism, unspecified: E03.9

## 2016-01-06 LAB — URINALYSIS, ROUTINE W REFLEX MICROSCOPIC
Bilirubin Urine: NEGATIVE
Glucose, UA: NEGATIVE mg/dL
Hgb urine dipstick: NEGATIVE
Ketones, ur: NEGATIVE mg/dL
NITRITE: NEGATIVE
PH: 5 (ref 5.0–8.0)
Protein, ur: NEGATIVE mg/dL
Specific Gravity, Urine: 1.02 (ref 1.005–1.030)

## 2016-01-06 LAB — CBC
HCT: 26.4 % — ABNORMAL LOW (ref 36.0–46.0)
Hemoglobin: 9.3 g/dL — ABNORMAL LOW (ref 12.0–15.0)
MCH: 30.7 pg (ref 26.0–34.0)
MCHC: 35.2 g/dL (ref 30.0–36.0)
MCV: 87.1 fL (ref 78.0–100.0)
Platelets: 200 10*3/uL (ref 150–400)
RBC: 3.03 MIL/uL — ABNORMAL LOW (ref 3.87–5.11)
RDW: 15.6 % — ABNORMAL HIGH (ref 11.5–15.5)
WBC: 9.1 10*3/uL (ref 4.0–10.5)

## 2016-01-06 LAB — BASIC METABOLIC PANEL
Anion gap: 13 (ref 5–15)
BUN: 39 mg/dL — ABNORMAL HIGH (ref 6–20)
CHLORIDE: 84 mmol/L — AB (ref 101–111)
CO2: 22 mmol/L (ref 22–32)
Calcium: 8.3 mg/dL — ABNORMAL LOW (ref 8.9–10.3)
Creatinine, Ser: 1.82 mg/dL — ABNORMAL HIGH (ref 0.44–1.00)
GFR calc non Af Amer: 27 mL/min — ABNORMAL LOW (ref >60)
GFR, EST AFRICAN AMERICAN: 31 mL/min — AB (ref >60)
Glucose, Bld: 187 mg/dL — ABNORMAL HIGH (ref 65–99)
Potassium: 4.4 mmol/L (ref 3.5–5.1)
Sodium: 119 mmol/L — CL (ref 135–145)

## 2016-01-06 LAB — URINE MICROSCOPIC-ADD ON: RBC / HPF: NONE SEEN RBC/hpf (ref 0–5)

## 2016-01-06 LAB — TSH: TSH: 1.646 u[IU]/mL (ref 0.350–4.500)

## 2016-01-06 LAB — BRAIN NATRIURETIC PEPTIDE: B Natriuretic Peptide: 49 pg/mL (ref 0.0–100.0)

## 2016-01-06 LAB — TROPONIN I: Troponin I: <0.03 ng/mL (ref <0.031)

## 2016-01-06 MED ORDER — CYCLOBENZAPRINE HCL 10 MG PO TABS
5.0000 mg | ORAL_TABLET | Freq: Three times a day (TID) | ORAL | Status: DC | PRN
  Administered 2016-01-06 – 2016-01-13 (×7): 5 mg via ORAL
  Filled 2016-01-06 (×7): qty 1

## 2016-01-06 MED ORDER — PANTOPRAZOLE SODIUM 40 MG PO TBEC
40.0000 mg | DELAYED_RELEASE_TABLET | Freq: Every day | ORAL | Status: DC
  Administered 2016-01-07 – 2016-01-13 (×7): 40 mg via ORAL
  Filled 2016-01-06 (×7): qty 1

## 2016-01-06 MED ORDER — SODIUM CHLORIDE 0.9% FLUSH
3.0000 mL | Freq: Two times a day (BID) | INTRAVENOUS | Status: DC
  Administered 2016-01-08 – 2016-01-12 (×10): 3 mL via INTRAVENOUS

## 2016-01-06 MED ORDER — VANCOMYCIN HCL IN DEXTROSE 1-5 GM/200ML-% IV SOLN: 1000.0000 mg | INTRAVENOUS | Status: DC

## 2016-01-06 MED ORDER — DILTIAZEM HCL ER COATED BEADS 120 MG PO CP24
120.0000 mg | ORAL_CAPSULE | Freq: Every day | ORAL | Status: DC
  Administered 2016-01-07 – 2016-01-13 (×7): 120 mg via ORAL
  Filled 2016-01-06 (×7): qty 1

## 2016-01-06 MED ORDER — LORATADINE 10 MG PO TABS
10.0000 mg | ORAL_TABLET | Freq: Every day | ORAL | Status: DC
  Administered 2016-01-07 – 2016-01-13 (×7): 10 mg via ORAL
  Filled 2016-01-06 (×7): qty 1

## 2016-01-06 MED ORDER — LOSARTAN POTASSIUM 50 MG PO TABS
100.0000 mg | ORAL_TABLET | Freq: Every day | ORAL | Status: DC
  Administered 2016-01-07 – 2016-01-11 (×5): 100 mg via ORAL
  Filled 2016-01-06 (×5): qty 2

## 2016-01-06 MED ORDER — LEVOTHYROXINE SODIUM 25 MCG PO TABS
137.0000 ug | ORAL_TABLET | Freq: Every day | ORAL | Status: DC
  Administered 2016-01-07 – 2016-01-13 (×7): 137 ug via ORAL
  Filled 2016-01-06 (×7): qty 1

## 2016-01-06 MED ORDER — FLUTICASONE FUROATE-VILANTEROL 200-25 MCG/INH IN AEPB
1.0000 | INHALATION_SPRAY | Freq: Every day | RESPIRATORY_TRACT | Status: DC
  Administered 2016-01-07 – 2016-01-13 (×7): 1 via RESPIRATORY_TRACT
  Filled 2016-01-06: qty 28

## 2016-01-06 MED ORDER — ONDANSETRON HCL 4 MG PO TABS: 4.0000 mg | ORAL_TABLET | Freq: Four times a day (QID) | ORAL | Status: DC | PRN

## 2016-01-06 MED ORDER — GABAPENTIN 300 MG PO CAPS
300.0000 mg | ORAL_CAPSULE | Freq: Every day | ORAL | Status: DC
Start: 2016-01-06 — End: 2016-01-09
  Administered 2016-01-07 – 2016-01-08 (×3): 300 mg via ORAL
  Filled 2016-01-06 (×3): qty 1

## 2016-01-06 MED ORDER — ACETAMINOPHEN 325 MG PO TABS: 650.0000 mg | ORAL_TABLET | Freq: Four times a day (QID) | ORAL | Status: DC | PRN

## 2016-01-06 MED ORDER — ONDANSETRON HCL 4 MG/2ML IJ SOLN: 4.0000 mg | Freq: Four times a day (QID) | INTRAMUSCULAR | Status: DC | PRN

## 2016-01-06 MED ORDER — APIXABAN 5 MG PO TABS
5.0000 mg | ORAL_TABLET | Freq: Two times a day (BID) | ORAL | Status: DC
  Administered 2016-01-07 – 2016-01-13 (×14): 5 mg via ORAL
  Filled 2016-01-06 (×14): qty 1

## 2016-01-06 MED ORDER — ACETAMINOPHEN 650 MG RE SUPP: 650.0000 mg | Freq: Four times a day (QID) | RECTAL | Status: DC | PRN

## 2016-01-06 MED ORDER — HYDROCODONE-ACETAMINOPHEN 5-325 MG PO TABS
1.0000 | ORAL_TABLET | ORAL | Status: DC | PRN
  Administered 2016-01-07 – 2016-01-12 (×6): 2 via ORAL
  Administered 2016-01-13 (×2): 1 via ORAL
  Filled 2016-01-06 (×3): qty 2
  Filled 2016-01-06: qty 1
  Filled 2016-01-06: qty 2
  Filled 2016-01-06: qty 1
  Filled 2016-01-06 (×2): qty 2

## 2016-01-06 MED ORDER — SODIUM CHLORIDE 0.9 % IV SOLN
INTRAVENOUS | Status: DC
  Administered 2016-01-06 – 2016-01-07 (×2): via INTRAVENOUS

## 2016-01-06 MED ORDER — ALBUTEROL SULFATE (2.5 MG/3ML) 0.083% IN NEBU: 2.5000 mg | INHALATION_SOLUTION | Freq: Four times a day (QID) | RESPIRATORY_TRACT | Status: DC

## 2016-01-06 MED ORDER — VANCOMYCIN HCL 10 G IV SOLR
1500.0000 mg | Freq: Once | INTRAVENOUS | Status: AC
  Administered 2016-01-06: 1500 mg via INTRAVENOUS
  Filled 2016-01-06: qty 1500

## 2016-01-06 NOTE — H&P (Signed)
History and Physical  Yolanda [name] ZOX:[id number] DOB: October 21, 1940 DOA: 01/06/2016  Referring physician: Dr Adriana Simasook, ED physician PCP: Fredirick MaudlinHAWKINS,EDWARD L, MD   Chief Complaint: Leg pain and swelling is getting worse  HPI: Yolanda MurrainBrenda W Kings is a 75 y.o. female  With a history of hypothyroidism, paroxysmal nature fibrillation, grade 1 diastolic congestive heart failure, COPD, GERD, hypertension. Patient was seen by her primary care physician for 5 days ago and was started on clindamycin and high dose of torsemide for a left lower extremity cellulitis with an unknown wound. This had been going on for proximally one week by the time she saw her primary care physician. Over the past several days, the swelling and erythema have worsened. Patient presents to the hospital today for evaluation. The left lower leg has worsening erythema and pain. No palliating or provoking factors. Patient denies nausea, vomiting, fevers   Review of Systems:   Pt complains of leg cramps.  Pt denies any fevers, chills, nausea, vomiting, diarrhea, constipation, abdominal pain, shortness of breath, dyspnea on exertion, orthopnea, cough, wheezing, palpitations, headache, vision changes, lightheadedness, dizziness, diarrhea, constipation, melena, rectal bleeding.  Review of systems are otherwise negative  Past Medical History  Diagnosis Date  . Thyroid condition   . Hypertension   . GERD (gastroesophageal reflux disease)   . COPD (chronic obstructive pulmonary disease) (HCC)   . Bronchitis   . Barrett esophagus   . CHF (congestive heart failure) (HCC)   . Atrial fibrillation [facility](HCC)    Past Surgical History  Procedure Laterality Date  . Cholecystectomy    . Colonoscopy    . Upper gastrointestinal endoscopy    . Right breast biopsy  Sept, 2012   Social History:  reports that she quit smoking about 16 years ago. Her smoking use included Cigarettes. She started smoking about 57 years ago. She has a 15 pack-year smoking  history. She has never used smokeless tobacco. She reports that she does not drink alcohol or use illicit drugs. Patient lives at home & is able to participate in activities of daily living  Allergies  Allergen Reactions  . Ciprofloxacin Shortness Of Breath    REACTION: sob,tachycardia  . Doxycycline Nausea Only    Also experienced diarrhea   . Fish Oil     Patient face drew to the side,Bells Palsey  . Guaifenesin Shortness Of Breath    REACTION: sob,tachycardia  . Sulfamethoxazole W/Trimethoprim 800-160 [Sulfamethoxazole-Trimethoprim] Nausea Only    Also lack of appetite   . Adhesive [Tape] Other (See Comments)    Causes blisters on skin  . Tramadol     insomnia    Family History  Problem Relation Age of Onset  . Dementia Mother   . Lung cancer Father     smoked  . Hypertension Sister   . Diabetes Brother   . Obesity Sister   . Hypertension Sister   . Restless legs syndrome Sister   . Healthy Sister   . Lung cancer Maternal Uncle      Prior to Admission medications   Medication Sig Start Date End Date Taking? Authorizing Provider  acetaminophen (TYLENOL) 500 MG tablet Take 500-1,000 mg by mouth as needed for mild pain.    Yes Historical Provider, MD  apixaban (ELIQUIS) 5 MG TABS tablet Take 1 tablet (5 mg total) by mouth 2 (two) times daily. 11/13/14  Yes Kari BaarsEdward Hawkins, MD  cetirizine (ZYRTEC) 10 MG tablet Take 10 mg by mouth daily.   Yes Historical Provider, MD  diltiazem (CARDIZEM CD) 120 MG 24 hr capsule Take 1 capsule (120 mg total) by mouth daily. 01/24/15  Yes Laqueta Linden, MD  famotidine (PEPCID) 20 MG tablet One at bedtime Patient taking differently: Take 20 mg by mouth at bedtime.  09/03/13  Yes Nyoka Cowden, MD  flintstones complete (FLINTSTONES) 60 MG chewable tablet Chew 3 tablets by mouth daily.   Yes Historical Provider, MD  gabapentin (NEURONTIN) 300 MG capsule Take 300 mg by mouth at bedtime. 1-2 tabs at bedtime restless leg syndrome   Yes  Historical Provider, MD  levalbuterol (XOPENEX) 0.63 MG/3ML nebulizer solution Take 3 mLs (0.63 mg total) by nebulization every 6 (six) hours as needed for wheezing or shortness of breath. 11/13/14  Yes Kari Baars, MD  levothyroxine (SYNTHROID, LEVOTHROID) 137 MCG tablet Take 137 mcg by mouth daily before breakfast.   Yes Historical Provider, MD  losartan (COZAAR) 100 MG tablet Take 100 mg by mouth daily. 12/23/13  Yes Historical Provider, MD  omeprazole (PRILOSEC) 20 MG capsule Take 1 capsule (20 mg total) by mouth daily. 12/30/14  Yes Len Blalock, NP  potassium chloride SA (K-DUR,KLOR-CON) 20 MEQ tablet Take 1 tablet (20 mEq total) by mouth daily. Patient taking differently: Take 20 mEq by mouth every other day.  02/18/15  Yes Laqueta Linden, MD  torsemide (DEMADEX) 20 MG tablet Take 60 mg by mouth 2 (two) times daily.  01/10/15  Yes Historical Provider, MD  VOLTAREN 1 % GEL Apply 1 application topically daily as needed. 08/25/14  Yes Historical Provider, MD  Fluticasone Furoate-Vilanterol 200-25 MCG/INH AEPB Inhale 1 puff into the lungs daily.    Historical Provider, MD    Physical Exam: BP 137/62 mmHg  Pulse 91  Temp(Src) 98.1 F (36.7 C) (Oral)  Resp 20  Ht  (1.549 m)  Wt 95.255 kg (210 lb)  BMI 39.70 kg/m2  SpO2 100%  General: Elderly, occasion female. Awake and alert and oriented x3. No acute cardiopulmonary distress.  Eyes: Pupils equal, round, reactive to light. Extraocular muscles are intact. Sclerae anicteric and noninjected.  ENT:  Moist mucosal membranes. No mucosal lesions.  Neck: Neck supple without lymphadenopathy. No carotid bruits. No masses palpated.  Cardiovascular: Regular rate with normal S1-S2 sounds. No murmurs, rubs, gallops auscultated. No JVD.  Respiratory: Good respiratory effort with no wheezes, rales, rhonchi. Lungs clear to auscultation bilaterally.  Abdomen: Soft, nontender, nondistended. Active bowel sounds. No masses or hepatosplenomegaly    Skin: Dry, warm to touch. 2+ dorsalis pedis and radial pulses. Erythema from the knee to ankle on the left lower extremity Musculoskeletal: No calf or leg pain. All major joints not erythematous nontender.  Psychiatric: Intact judgment and insight.  Neurologic: No focal neurological deficits. Cranial nerves II through XII are grossly intact.           Labs on Admission:  Basic Metabolic Panel:  Recent Labs Lab 01/06/16 1900  NA 119*  K 4.4  CL 84*  CO2 22  GLUCOSE 187*  BUN 39*  CREATININE 1.82*  CALCIUM 8.3*   Liver Function Tests: No results for input(s): AST, ALT, ALKPHOS, BILITOT, PROT, ALBUMIN in the last 168 hours. No results for input(s): LIPASE, AMYLASE in the last 168 hours. No results for input(s): AMMONIA in the last 168 hours. CBC:  Recent Labs Lab 01/06/16 1900  WBC 9.1  HGB 9.3*  HCT 26.4*  MCV 87.1  PLT 200   Cardiac Enzymes:  Recent Labs Lab 01/06/16 1900  TROPONINI <  0.03    BNP (last 3 results)  Recent Labs  01/06/16 1900  BNP 49.0    ProBNP (last 3 results) No results for input(s): PROBNP in the last 8760 hours.  CBG: No results for input(s): GLUCAP in the last 168 hours.  Radiological Exams on Admission: Dg Chest 2 View  01/06/2016  CLINICAL DATA:  Patient with lower extremity edema. Lower extremity cellulitis. EXAM: CHEST  2 VIEW COMPARISON:  Chest radiograph 11/06/2014; 07/01/2014. FINDINGS: Monitoring leads overlie the patient. Stable enlarged cardiac and mediastinal contours. No consolidative pulmonary opacities. No pleural effusion or pneumothorax. Thoracic spine degenerative changes. IMPRESSION: No active cardiopulmonary disease. Electronically Signed   By: Annia Belt M.D.   On: 01/06/2016 20:07    Assessment/Plan Present on Admission:  . Cellulitis . Hyponatremia . AKI (acute kidney injury) (HCC) . PAF (paroxysmal atrial fibrillation) (HCC) . Chronic diastolic CHF (congestive heart failure) (HCC)  This patient was  discussed with the ED physician, including pertinent vitals, physical exam findings, labs, and imaging.  We also discussed care given by the ED provider.  #1 cellulitis  Admitted for failed outpatient treatment  Continue vancomycin #2 hyponatremia  Diuretic induced  Will discontinue diuretics and start gentle hydration with normal saline 75 mLper hour  Recheck sodium in 4 hours and in morning #3 acute kidney injury  Secondary to high-dose of diuretics  Recheck creatinine in the morning #4 paroxysmal atrial fibrillation  [place] Vasc score of 4  Continue Eliquis #5 chronic diastolic heart failure  We'll be cautious with hydration  DVT prophylaxis: On Eliquis  Consultants: None  Code Status: Full code  Family Communication: None   Disposition Plan: Admission to telemetry   Levie Heritage, DO [facility] Pager [phone]

## 2016-01-06 NOTE — ED Notes (Signed)
Patient ambulatory to restroom  ?

## 2016-01-06 NOTE — ED Notes (Signed)
CRITICAL VALUE ALERT  Critical value received: Na+ 119 Date of notification:  [id number]  Time of notification:  2007  Critical value read back:Yes.    Nurse who received alert:  JRM  MD notified (1st page):  Adriana Simasook  Time of first page:  2007  MD notified (2nd page):  Time of second page:  Responding MD:  Adriana Simasook  Time MD responded:  2007

## 2016-01-06 NOTE — ED Notes (Signed)
Attempted to call report to Charge RN, unavailable to take report at this time.

## 2016-01-06 NOTE — Progress Notes (Signed)
ANTIBIOTIC CONSULT NOTE - INITIAL  Pharmacy Consult for Vancomycin Indication: cellulitis  Allergies  Allergen Reactions  . Ciprofloxacin Shortness Of Breath    REACTION: sob,tachycardia  . Doxycycline Nausea Only    Also experienced diarrhea   . Fish Oil     Patient face drew to the side,Bells Palsey  . Guaifenesin Shortness Of Breath    REACTION: sob,tachycardia  . Sulfamethoxazole W/Trimethoprim 800-160 [Sulfamethoxazole-Trimethoprim] Nausea Only    Also lack of appetite   . Adhesive [Tape] Other (See Comments)    Causes blisters on skin  . Tramadol     insomnia    Patient Measurements: Height: 5\' 1"  (154.9 cm) Weight: 210 lb (95.255 kg) IBW/kg (Calculated) : 47.8 Adjusted Body Weight:   Vital Signs: Temp: 98.1 F (36.7 C) (04/14 2026) Temp Source: Oral (04/14 2026) BP: 109/38 mmHg (04/14 2030) Pulse Rate: 77 (04/14 2030) Intake/Output from previous day:   Intake/Output from this shift:    Labs:  Recent Labs  01/06/16 1900  WBC 9.1  HGB 9.3*  PLT 200  CREATININE 1.82*   Estimated Creatinine Clearance: 29.5 mL/min (by C-G formula based on Cr of 1.82). No results for input(s): VANCOTROUGH, VANCOPEAK, VANCORANDOM, GENTTROUGH, GENTPEAK, GENTRANDOM, TOBRATROUGH, TOBRAPEAK, TOBRARND, AMIKACINPEAK, AMIKACINTROU, AMIKACIN in the last 72 hours.   Microbiology: No results found for this or any previous visit (from the past 720 hour(s)).  Medical History: Past Medical History  Diagnosis Date  . Thyroid condition   . Hypertension   . GERD (gastroesophageal reflux disease)   . COPD (chronic obstructive pulmonary disease) (HCC)   . Bronchitis   . Barrett esophagus   . CHF (congestive heart failure) (HCC)   . Atrial fibrillation (HCC)     Medications:  Scheduled:   Assessment: 75 yo female ED patient, cellulitis left leg Reduced renal function  Goal of Therapy:  Vancomycin trough level 10-15 mcg/ml  Plan:  Vancomycin 1500 mg IV loading dose, then  Vancomycin 1 GM IV every 24 hours Vancomycin trough at steady state Monitor renal function Labs per protocol  Raquel JamesPittman, Avah Bashor Bennett 01/06/2016,9:38 PM

## 2016-01-06 NOTE — ED Notes (Signed)
Attempted to call report at this time, inpatient RN unavailable to take report

## 2016-01-06 NOTE — ED Notes (Signed)
Pt c/o difficulty urination and bilateral leg swelling. Pt also reports dyspnea with exertion, generalized weakness and fatigue. Pt states she is taking her fluid pill daily. Pt currently being treated for cellulitis on LT leg.

## 2016-01-06 NOTE — ED Provider Notes (Signed)
CSN: [id number]     Arrival date & time 01/06/16  1838 History   First MD Initiated Contact with Patient 01/06/16 1849     Chief Complaint  Patient presents with  . Urinary Retention  . Leg Swelling     (Consider location/radiation/quality/duration/timing/severity/associated sxs/prior Treatment) HPI.... Worsening cellulitis of left lower extremity, decreased urinary output, generalized fatigue for several days. Patient was seen by her primary care doctor earlier in the week and placed on clindamycin.  Her Torsamide has recently been increased to 60 mg twice a day. Additionally she has been placed on a new diuretic.  No fever, sweats, chills. Creatinine is been minimally elevated in the past.  Past Medical History  Diagnosis Date  . Thyroid condition   . Hypertension   . GERD (gastroesophageal reflux disease)   . COPD (chronic obstructive pulmonary disease) (HCC)   . Bronchitis   . Barrett esophagus   . CHF (congestive heart failure) (HCC)   . Atrial fibrillation [facility])    Past Surgical History  Procedure Laterality Date  . Cholecystectomy    . Colonoscopy    . Upper gastrointestinal endoscopy    . Right breast biopsy  Sept, 2012   Family History  Problem Relation Age of Onset  . Dementia Mother   . Lung cancer Father     smoked  . Hypertension Sister   . Diabetes Brother   . Obesity Sister   . Hypertension Sister   . Restless legs syndrome Sister   . Healthy Sister   . Lung cancer Maternal Uncle    Social History  Substance Use Topics  . Smoking status: Former Smoker -- 0.50 packs/day for 30 years    Types: Cigarettes    Start date: 09/24/1958    Quit date: 11/13/1999  . Smokeless tobacco: Never Used  . Alcohol Use: No   OB History    No data available     Review of Systems  All other systems reviewed and are negative.     Allergies  Ciprofloxacin; Doxycycline; Fish oil; Guaifenesin; Sulfamethoxazole w/trimethoprim 800-160; Adhesive; and Tramadol  Home  Medications   Prior to Admission medications   Medication Sig Start Date End Date Taking? Authorizing Provider  acetaminophen (TYLENOL) 500 MG tablet Take 500-1,000 mg by mouth as needed for mild pain.    Yes Historical Provider, MD  apixaban (ELIQUIS) 5 MG TABS tablet Take 1 tablet (5 mg total) by mouth 2 (two) times daily. 11/13/14  Yes Kari Baars, MD  cetirizine (ZYRTEC) 10 MG tablet Take 10 mg by mouth daily.   Yes Historical Provider, MD  diltiazem (CARDIZEM CD) 120 MG 24 hr capsule Take 1 capsule (120 mg total) by mouth daily. 01/24/15  Yes Laqueta Linden, MD  famotidine (PEPCID) 20 MG tablet One at bedtime Patient taking differently: Take 20 mg by mouth at bedtime.  09/03/13  Yes Nyoka Cowden, MD  flintstones complete (FLINTSTONES) 60 MG chewable tablet Chew 3 tablets by mouth daily.   Yes Historical Provider, MD  gabapentin (NEURONTIN) 300 MG capsule Take 300 mg by mouth at bedtime. 1-2 tabs at bedtime restless leg syndrome   Yes Historical Provider, MD  levalbuterol (XOPENEX) 0.63 MG/3ML nebulizer solution Take 3 mLs (0.63 mg total) by nebulization every 6 (six) hours as needed for wheezing or shortness of breath. 11/13/14  Yes Kari Baars, MD  levothyroxine (SYNTHROID, LEVOTHROID) 137 MCG tablet Take 137 mcg by mouth daily before breakfast.   Yes Historical Provider, MD  losartan (  COZAAR) 100 MG tablet Take 100 mg by mouth daily. 12/23/13  Yes Historical Provider, MD  omeprazole (PRILOSEC) 20 MG capsule Take 1 capsule (20 mg total) by mouth daily. 12/30/14  Yes Len Blalockerri L Setzer, NP  potassium chloride SA (K-DUR,KLOR-CON) 20 MEQ tablet Take 1 tablet (20 mEq total) by mouth daily. Patient taking differently: Take 20 mEq by mouth every other day.  02/18/15  Yes Laqueta LindenSuresh A Koneswaran, MD  torsemide (DEMADEX) 20 MG tablet Take 60 mg by mouth 2 (two) times daily.  01/10/15  Yes Historical Provider, MD  VOLTAREN 1 % GEL Apply 1 application topically daily as needed. 08/25/14  Yes Historical  Provider, MD  Fluticasone Furoate-Vilanterol 200-25 MCG/INH AEPB Inhale 1 puff into the lungs daily.    Historical Provider, MD   BP 109/38 mmHg  Pulse 77  Temp(Src) 98.1 F (36.7 C) (Oral)  Resp 17  Ht 5\' 1"  (1.549 m)  Wt 210 lb (95.255 kg)  BMI 39.70 kg/m2  SpO2 96% Physical Exam  Constitutional: She is oriented to person, place, and time.  Obese  HENT:  Head: Normocephalic and atraumatic.  Eyes: Conjunctivae and EOM are normal. Pupils are equal, round, and reactive to light.  Neck: Normal range of motion. Neck supple.  Cardiovascular: Normal rate and regular rhythm.   Pulmonary/Chest: Effort normal and breath sounds normal.  Abdominal: Soft. Bowel sounds are normal.  Musculoskeletal: Normal range of motion.  Neurological: She is alert and oriented to person, place, and time.  Skin:  Left lower extremity: Erythema and edema from knee down to the foot  Psychiatric: She has a normal mood and affect. Her behavior is normal.  Nursing note and vitals reviewed.   ED Course  Procedures (including critical care time) Labs Review Labs Reviewed  BASIC METABOLIC PANEL - Abnormal; Notable for the following:    Sodium 119 (*)    Chloride 84 (*)    Glucose, Bld 187 (*)    BUN 39 (*)    Creatinine, Ser 1.82 (*)    Calcium 8.3 (*)    GFR calc non Af Amer 27 (*)    GFR calc Af Amer 31 (*)    All other components within normal limits  CBC - Abnormal; Notable for the following:    RBC 3.03 (*)    Hemoglobin 9.3 (*)    HCT 26.4 (*)    RDW 15.6 (*)    All other components within normal limits  URINALYSIS, ROUTINE W REFLEX MICROSCOPIC (NOT [facility]) - Abnormal; Notable for the following:    Leukocytes, UA TRACE (*)    All other components within normal limits  URINE MICROSCOPIC-ADD ON - Abnormal; Notable for the following:    Squamous Epithelial / LPF 0-5 (*)    Bacteria, UA FEW (*)    All other components within normal limits  TROPONIN I  BRAIN NATRIURETIC PEPTIDE  TSH  BASIC  METABOLIC PANEL    Imaging Review Dg Chest 2 View  01/06/2016  CLINICAL DATA:  Patient with lower extremity edema. Lower extremity cellulitis. EXAM: CHEST  2 VIEW COMPARISON:  Chest radiograph 11/06/2014; 07/01/2014. FINDINGS: Monitoring leads overlie the patient. Stable enlarged cardiac and mediastinal contours. No consolidative pulmonary opacities. No pleural effusion or pneumothorax. Thoracic spine degenerative changes. IMPRESSION: No active cardiopulmonary disease. Electronically Signed   By: Annia Beltrew  Davis M.D.   On: 01/06/2016 20:07   I have personally reviewed and evaluated these images and lab results as part of my medical decision-making.  EKG Interpretation None      MDM   Final diagnoses:  Cellulitis of left lower extremity  Renal insufficiency  Hyponatremia    Patient has obvious cellulitis of the left lower extremity. She has failed outpatient treatment. Additionally her creatinine is 1.82 and her sodium is 119. IV vancomycin. Admit to general medicine.    Donnetta Hutching, MD 01/06/16 2201

## 2016-01-07 LAB — BASIC METABOLIC PANEL
Anion gap: 9 (ref 5–15)
BUN: 36 mg/dL — AB (ref 6–20)
CALCIUM: 8.2 mg/dL — AB (ref 8.9–10.3)
CHLORIDE: 89 mmol/L — AB (ref 101–111)
CO2: 26 mmol/L (ref 22–32)
Creatinine, Ser: 1.45 mg/dL — ABNORMAL HIGH (ref 0.44–1.00)
GFR calc Af Amer: 41 mL/min — ABNORMAL LOW (ref >60)
GFR, EST NON AFRICAN AMERICAN: 35 mL/min — AB (ref >60)
GLUCOSE: 136 mg/dL — AB (ref 65–99)
POTASSIUM: 3.8 mmol/L (ref 3.5–5.1)
Sodium: 124 mmol/L — ABNORMAL LOW (ref 135–145)

## 2016-01-07 MED ORDER — VANCOMYCIN HCL 10 G IV SOLR
1250.0000 mg | INTRAVENOUS | Status: DC
  Administered 2016-01-07 – 2016-01-09 (×3): 1250 mg via INTRAVENOUS
  Filled 2016-01-07 (×4): qty 1250

## 2016-01-07 MED ORDER — ALBUTEROL SULFATE (2.5 MG/3ML) 0.083% IN NEBU
2.5000 mg | INHALATION_SOLUTION | Freq: Four times a day (QID) | RESPIRATORY_TRACT | Status: DC
  Administered 2016-01-07 – 2016-01-09 (×9): 2.5 mg via RESPIRATORY_TRACT
  Filled 2016-01-07 (×10): qty 3

## 2016-01-07 NOTE — Progress Notes (Signed)
Checked on patient , did not give neb as patient only takes 3 times a day. Plus inhaler, she states her breathing is ok.

## 2016-01-07 NOTE — Progress Notes (Signed)
ANTIBIOTIC CONSULT NOTE - Follow up  Pharmacy Consult for Vancomycin Indication: cellulitis  Allergies  Allergen Reactions  . Ciprofloxacin Shortness Of Breath    REACTION: sob,tachycardia  . Doxycycline Nausea Only    Also experienced diarrhea   . Fish Oil     Patient face drew to the side,Bells [place]  . Guaifenesin Shortness Of Breath    REACTION: sob,tachycardia  . Sulfamethoxazole W/Trimethoprim 800-160 [Sulfamethoxazole-Trimethoprim] Nausea Only    Also lack of appetite   . Adhesive [Tape] Other (See Comments)    Causes blisters on skin  . Tramadol     insomnia    Patient Measurements: Height: 5\' 1"  (154.9 cm) Weight: 234 lb 1.6 oz (106.187 kg) IBW/kg (Calculated) : 47.8 Adjusted Body Weight:   Vital Signs: Temp: 98 F (36.7 C) (04/15 0427) Temp Source: Oral (04/15 0427) BP: 100/46 mmHg (04/15 0427) Pulse Rate: 79 (04/15 0427) Intake/Output from previous day: 04/14 0701 - 04/15 0700 In: -  Out: 1550 [Urine:1550] Intake/Output from this shift:    Labs:  Recent Labs  01/06/16 1900 01/07/16 0440  WBC 9.1  --   HGB 9.3*  --   PLT 200  --   CREATININE 1.82* 1.45*   Estimated Creatinine Clearance: 39.4 mL/min (by C-G formula based on Cr of 1.45). No results for input(s): VANCOTROUGH, VANCOPEAK, VANCORANDOM, GENTTROUGH, GENTPEAK, GENTRANDOM, TOBRATROUGH, TOBRAPEAK, TOBRARND, AMIKACINPEAK, AMIKACINTROU, AMIKACIN in the last 72 hours.   Microbiology: No results found for this or any previous visit (from the past 720 hour(s)).  Medical History: Past Medical History  Diagnosis Date  . Thyroid condition   . Hypertension   . GERD (gastroesophageal reflux disease)   . COPD (chronic obstructive pulmonary disease) (HCC)   . Bronchitis   . Barrett esophagus   . CHF (congestive heart failure) (HCC)   . Atrial fibrillation (HCC)     Medications:  Scheduled:  . albuterol  2.5 mg Nebulization Q6H WA  . apixaban  5 mg Oral BID  . diltiazem  120 mg Oral  Daily  . fluticasone furoate-vilanterol  1 puff Inhalation Daily  . gabapentin  300 mg Oral QHS  . levothyroxine  137 mcg Oral QAC breakfast  . loratadine  10 mg Oral Daily  . losartan  100 mg Oral Daily  . pantoprazole  40 mg Oral Daily  . sodium chloride flush  3 mL Intravenous Q12H  . vancomycin  1,250 mg Intravenous Q24H   Assessment: 75 yo female ED patient, cellulitis left leg Reduced renal function, normalized CrCl 39.4 ml/min Obese patient 106.2 kg  Goal of Therapy:  Vancomycin trough level 10-15 mcg/ml  Plan:  Change Vancomycin dose to 1250 mg IV every 24 hours Vancomycin trough at steady state Monitor renal function Labs per protocol  Raquel JamesPittman, Frankee Gritz Bennett 01/07/2016,8:29 AM

## 2016-01-07 NOTE — Progress Notes (Signed)
Subjective: Patient was admitted due to cellulitis of the left leg. She is on IV antibiotics. Patient feels better today. No fever. Objective: Vital signs in last 24 hours: Temp:  [98 F (36.7 C)-98.1 F (36.7 C)] 98 F (36.7 C) (04/15 0427) Pulse Rate:  [77-91] 79 (04/15 0427) Resp:  [17-20] 20 (04/15 0427) BP: (100-138)/(34-62) 100/46 mmHg (04/15 0427) SpO2:  [95 %-100 %] 95 % (04/15 0427) Weight:  [95.255 kg (210 lb)-106.595 kg (235 lb)] 106.187 kg (234 lb 1.6 oz) (04/15 0654) Weight change:  Last BM Date: 01/06/16  Intake/Output from previous day: 04/14 0701 - 04/15 0700 In: -  Out: 1550 [Urine:1550]  PHYSICAL EXAM General appearance: alert and no distress Resp: diminished breath sounds bilaterally and rhonchi bilaterally Cardio: S1, S2 normal GI: soft, non-tender; bowel sounds normal; no masses,  no organomegaly Extremities: 3+++ leg edema, diffuse tender redness of the left leg.  Lab Results:  Results for orders placed or performed during the hospital encounter of 01/06/16 (from the past 48 hour(s))  Basic metabolic panel     Status: Abnormal   Collection Time: 01/06/16  7:00 PM  Result Value Ref Range   Sodium 119 (LL) 135 - 145 mmol/L    Comment: CRITICAL RESULT CALLED TO, READ BACK BY AND VERIFIED WITH: MIZE,JUSTIN _0  ON 01/06/16 BY AGUNDIZ,E.    Potassium 4.4 3.5 - 5.1 mmol/L   Chloride 84 (L) 101 - 111 mmol/L   CO2 22 22 - 32 mmol/L   Glucose, Bld 187 (H) 65 - 99 mg/dL   BUN 39 (H) 6 - 20 mg/dL   Creatinine, Ser 1.82 (H) 0.44 - 1.00 mg/dL   Calcium 8.3 (L) 8.9 - 10.3 mg/dL   GFR calc non Af Amer 27 (L) >60 mL/min   GFR calc Af Amer 31 (L) >60 mL/min    Comment: (NOTE) The eGFR has been calculated using the CKD EPI equation. This calculation has not been validated in all clinical situations. eGFR's persistently <60 mL/min signify possible Chronic Kidney Disease.    Anion gap 13 5 - 15  CBC     Status: Abnormal   Collection Time: 01/06/16  7:00 PM   Result Value Ref Range   WBC 9.1 4.0 - 10.5 K/uL   RBC 3.03 (L) 3.87 - 5.11 MIL/uL   Hemoglobin 9.3 (L) 12.0 - 15.0 g/dL   HCT 26.4 (L) 36.0 - 46.0 %   MCV 87.1 78.0 - 100.0 fL   MCH 30.7 26.0 - 34.0 pg   MCHC 35.2 30.0 - 36.0 g/dL   RDW 15.6 (H) 11.5 - 15.5 %   Platelets 200 150 - 400 K/uL  Troponin I     Status: None   Collection Time: 01/06/16  7:00 PM  Result Value Ref Range   Troponin I <0.03 <0.031 ng/mL    Comment:        NO INDICATION OF MYOCARDIAL INJURY.   Brain natriuretic peptide     Status: None   Collection Time: 01/06/16  7:00 PM  Result Value Ref Range   B Natriuretic Peptide 49.0 0.0 - 100.0 pg/mL  TSH     Status: None   Collection Time: 01/06/16  7:00 PM  Result Value Ref Range   TSH 1.646 0.350 - 4.500 uIU/mL  Urinalysis, Routine w reflex microscopic     Status: Abnormal   Collection Time: 01/06/16  8:17 PM  Result Value Ref Range   Color, Urine YELLOW YELLOW   APPearance CLEAR CLEAR  Specific Gravity, Urine 1.020 1.005 - 1.030   pH 5.0 5.0 - 8.0   Glucose, UA NEGATIVE NEGATIVE mg/dL   Hgb urine dipstick NEGATIVE NEGATIVE   Bilirubin Urine NEGATIVE NEGATIVE   Ketones, ur NEGATIVE NEGATIVE mg/dL   Protein, ur NEGATIVE NEGATIVE mg/dL   Nitrite NEGATIVE NEGATIVE   Leukocytes, UA TRACE (A) NEGATIVE  Urine microscopic-add on     Status: Abnormal   Collection Time: 01/06/16  8:17 PM  Result Value Ref Range   Squamous Epithelial / LPF 0-5 (A) NONE SEEN   WBC, UA 6-30 0 - 5 WBC/hpf   RBC / HPF NONE SEEN 0 - 5 RBC/hpf   Bacteria, UA FEW (A) NONE SEEN  Basic metabolic panel     Status: Abnormal   Collection Time: 01/07/16  4:40 AM  Result Value Ref Range   Sodium 124 (L) 135 - 145 mmol/L   Potassium 3.8 3.5 - 5.1 mmol/L   Chloride 89 (L) 101 - 111 mmol/L   CO2 26 22 - 32 mmol/L   Glucose, Bld 136 (H) 65 - 99 mg/dL   BUN 36 (H) 6 - 20 mg/dL   Creatinine, Ser 1.45 (H) 0.44 - 1.00 mg/dL   Calcium 8.2 (L) 8.9 - 10.3 mg/dL   GFR calc non Af Amer 35 (L)  >60 mL/min   GFR calc Af Amer 41 (L) >60 mL/min    Comment: (NOTE) The eGFR has been calculated using the CKD EPI equation. This calculation has not been validated in all clinical situations. eGFR's persistently <60 mL/min signify possible Chronic Kidney Disease.    Anion gap 9 5 - 15    ABGS No results for input(s): PHART, PO2ART, TCO2, HCO3 in the last 72 hours.  Invalid input(s): PCO2 CULTURES No results found for this or any previous visit (from the past 240 hour(s)). Studies/Results: Dg Chest 2 View  01/06/2016  CLINICAL DATA:  Patient with lower extremity edema. Lower extremity cellulitis. EXAM: CHEST  2 VIEW COMPARISON:  Chest radiograph 11/06/2014; 07/01/2014. FINDINGS: Monitoring leads overlie the patient. Stable enlarged cardiac and mediastinal contours. No consolidative pulmonary opacities. No pleural effusion or pneumothorax. Thoracic spine degenerative changes. IMPRESSION: No active cardiopulmonary disease. Electronically Signed   By: Lovey Newcomer M.D.   On: 01/06/2016 20:07    Medications: I have reviewed the patient's current medications.  Assesment:   Active Problems:   Chronic diastolic CHF (congestive heart failure) (HCC)   PAF (paroxysmal atrial fibrillation) (HCC)   Cellulitis   Hyponatremia   AKI (acute kidney injury) ([place])    Plan:  Medications reviewed Continue IV antibiotics Continue current treatment Will monitor CBC/BMP    LOS: 1 day   Dannah Ryles 01/07/2016, 8:08 AM

## 2016-01-08 LAB — CBC
HCT: 24.9 % — ABNORMAL LOW (ref 36.0–46.0)
Hemoglobin: 8.6 g/dL — ABNORMAL LOW (ref 12.0–15.0)
MCH: 30.6 pg (ref 26.0–34.0)
MCHC: 34.5 g/dL (ref 30.0–36.0)
MCV: 88.6 fL (ref 78.0–100.0)
Platelets: 180 K/uL (ref 150–400)
RBC: 2.81 MIL/uL — ABNORMAL LOW (ref 3.87–5.11)
RDW: 16 % — ABNORMAL HIGH (ref 11.5–15.5)
WBC: 6.6 K/uL (ref 4.0–10.5)

## 2016-01-08 LAB — BASIC METABOLIC PANEL
Anion gap: 9 (ref 5–15)
BUN: 35 mg/dL — AB (ref 6–20)
CALCIUM: 8.1 mg/dL — AB (ref 8.9–10.3)
CHLORIDE: 92 mmol/L — AB (ref 101–111)
CO2: 23 mmol/L (ref 22–32)
CREATININE: 1.55 mg/dL — AB (ref 0.44–1.00)
GFR calc Af Amer: 37 mL/min — ABNORMAL LOW (ref >60)
GFR calc non Af Amer: 32 mL/min — ABNORMAL LOW (ref >60)
Glucose, Bld: 126 mg/dL — ABNORMAL HIGH (ref 65–99)
Potassium: 4.2 mmol/L (ref 3.5–5.1)
SODIUM: 124 mmol/L — AB (ref 135–145)

## 2016-01-08 MED ORDER — FUROSEMIDE 20 MG PO TABS
20.0000 mg | ORAL_TABLET | Freq: Two times a day (BID) | ORAL | Status: DC
  Administered 2016-01-08 – 2016-01-09 (×3): 20 mg via ORAL
  Filled 2016-01-08 (×4): qty 1

## 2016-01-08 MED ORDER — FUROSEMIDE 10 MG/ML IJ SOLN
40.0000 mg | Freq: Once | INTRAMUSCULAR | Status: AC
  Administered 2016-01-08: 40 mg via INTRAVENOUS
  Filled 2016-01-08: qty 4

## 2016-01-08 NOTE — Progress Notes (Signed)
Notified MD, pt has lost iv access. Pt has right arm restrictions. Supervisor and experienced nurses attempted iv, no success. Care order entered per MD, to attempt additional iv.

## 2016-01-08 NOTE — Progress Notes (Signed)
Subjective: Patient feels better today. Her pain and redness of the left leg is better. No fever or chills.. Objective: Vital signs in last 24 hours: Temp:  [97.8 F (36.6 C)-98.7 F (37.1 C)] 98.7 F (37.1 C) (04/16 0454) Pulse Rate:  [86-90] 86 (04/16 0948) Resp:  [18-20] 18 (04/16 0948) BP: (111-151)/(43-55) 111/50 mmHg (04/16 0948) SpO2:  [91 %-95 %] 94 % (04/16 0948) Weight:  [108.636 kg (239 lb 8 oz)] 108.636 kg (239 lb 8 oz) (04/16 0454) Weight change: 13.381 kg (29 lb 8 oz) Last BM Date: 01/06/16  Intake/Output from previous day: 04/15 0701 - 04/16 0700 In: 600 [P.O.:600] Out: 1200 [Urine:1200]  PHYSICAL EXAM General appearance: alert and no distress Resp: diminished breath sounds bilaterally and rhonchi bilaterally Cardio: S1, S2 normal GI: soft, non-tender; bowel sounds normal; no masses,  no organomegaly Extremities: 3+++ leg edema, diffuse tender redness of the left leg.  Lab Results:  Results for orders placed or performed during the hospital encounter of 01/06/16 (from the past 48 hour(s))  Basic metabolic panel     Status: Abnormal   Collection Time: 01/06/16  7:00 PM  Result Value Ref Range   Sodium 119 (LL) 135 - 145 mmol/L    Comment: CRITICAL RESULT CALLED TO, READ BACK BY AND VERIFIED WITH: MIZE,JUSTIN _0  ON 01/06/16 BY AGUNDIZ,E.    Potassium 4.4 3.5 - 5.1 mmol/L   Chloride 84 (L) 101 - 111 mmol/L   CO2 22 22 - 32 mmol/L   Glucose, Bld 187 (H) 65 - 99 mg/dL   BUN 39 (H) 6 - 20 mg/dL   Creatinine, Ser 1.82 (H) 0.44 - 1.00 mg/dL   Calcium 8.3 (L) 8.9 - 10.3 mg/dL   GFR calc non Af Amer 27 (L) >60 mL/min   GFR calc Af Amer 31 (L) >60 mL/min    Comment: (NOTE) The eGFR has been calculated using the CKD EPI equation. This calculation has not been validated in all clinical situations. eGFR's persistently <60 mL/min signify possible Chronic Kidney Disease.    Anion gap 13 5 - 15  CBC     Status: Abnormal   Collection Time: 01/06/16  7:00 PM   Result Value Ref Range   WBC 9.1 4.0 - 10.5 K/uL   RBC 3.03 (L) 3.87 - 5.11 MIL/uL   Hemoglobin 9.3 (L) 12.0 - 15.0 g/dL   HCT 26.4 (L) 36.0 - 46.0 %   MCV 87.1 78.0 - 100.0 fL   MCH 30.7 26.0 - 34.0 pg   MCHC 35.2 30.0 - 36.0 g/dL   RDW 15.6 (H) 11.5 - 15.5 %   Platelets 200 150 - 400 K/uL  Troponin I     Status: None   Collection Time: 01/06/16  7:00 PM  Result Value Ref Range   Troponin I <0.03 <0.031 ng/mL    Comment:        NO INDICATION OF MYOCARDIAL INJURY.   Brain natriuretic peptide     Status: None   Collection Time: 01/06/16  7:00 PM  Result Value Ref Range   B Natriuretic Peptide 49.0 0.0 - 100.0 pg/mL  TSH     Status: None   Collection Time: 01/06/16  7:00 PM  Result Value Ref Range   TSH 1.646 0.350 - 4.500 uIU/mL  Urinalysis, Routine w reflex microscopic     Status: Abnormal   Collection Time: 01/06/16  8:17 PM  Result Value Ref Range   Color, Urine YELLOW YELLOW   APPearance CLEAR CLEAR  Specific Gravity, Urine 1.020 1.005 - 1.030   pH 5.0 5.0 - 8.0   Glucose, UA NEGATIVE NEGATIVE mg/dL   Hgb urine dipstick NEGATIVE NEGATIVE   Bilirubin Urine NEGATIVE NEGATIVE   Ketones, ur NEGATIVE NEGATIVE mg/dL   Protein, ur NEGATIVE NEGATIVE mg/dL   Nitrite NEGATIVE NEGATIVE   Leukocytes, UA TRACE (A) NEGATIVE  Urine microscopic-add on     Status: Abnormal   Collection Time: 01/06/16  8:17 PM  Result Value Ref Range   Squamous Epithelial / LPF 0-5 (A) NONE SEEN   WBC, UA 6-30 0 - 5 WBC/hpf   RBC / HPF NONE SEEN 0 - 5 RBC/hpf   Bacteria, UA FEW (A) NONE SEEN  Basic metabolic panel     Status: Abnormal   Collection Time: 01/07/16  4:40 AM  Result Value Ref Range   Sodium 124 (L) 135 - 145 mmol/L   Potassium 3.8 3.5 - 5.1 mmol/L   Chloride 89 (L) 101 - 111 mmol/L   CO2 26 22 - 32 mmol/L   Glucose, Bld 136 (H) 65 - 99 mg/dL   BUN 36 (H) 6 - 20 mg/dL   Creatinine, Ser 1.45 (H) 0.44 - 1.00 mg/dL   Calcium 8.2 (L) 8.9 - 10.3 mg/dL   GFR calc non Af Amer 35 (L)  >60 mL/min   GFR calc Af Amer 41 (L) >60 mL/min    Comment: (NOTE) The eGFR has been calculated using the CKD EPI equation. This calculation has not been validated in all clinical situations. eGFR's persistently <60 mL/min signify possible Chronic Kidney Disease.    Anion gap 9 5 - 15  CBC     Status: Abnormal   Collection Time: 01/08/16  6:06 AM  Result Value Ref Range   WBC 6.6 4.0 - 10.5 K/uL   RBC 2.81 (L) 3.87 - 5.11 MIL/uL   Hemoglobin 8.6 (L) 12.0 - 15.0 g/dL   HCT 24.9 (L) 36.0 - 46.0 %   MCV 88.6 78.0 - 100.0 fL   MCH 30.6 26.0 - 34.0 pg   MCHC 34.5 30.0 - 36.0 g/dL   RDW 16.0 (H) 11.5 - 15.5 %   Platelets 180 150 - 400 K/uL  Basic metabolic panel     Status: Abnormal   Collection Time: 01/08/16  6:06 AM  Result Value Ref Range   Sodium 124 (L) 135 - 145 mmol/L   Potassium 4.2 3.5 - 5.1 mmol/L   Chloride 92 (L) 101 - 111 mmol/L   CO2 23 22 - 32 mmol/L   Glucose, Bld 126 (H) 65 - 99 mg/dL   BUN 35 (H) 6 - 20 mg/dL   Creatinine, Ser 1.55 (H) 0.44 - 1.00 mg/dL   Calcium 8.1 (L) 8.9 - 10.3 mg/dL   GFR calc non Af Amer 32 (L) >60 mL/min   GFR calc Af Amer 37 (L) >60 mL/min    Comment: (NOTE) The eGFR has been calculated using the CKD EPI equation. This calculation has not been validated in all clinical situations. eGFR's persistently <60 mL/min signify possible Chronic Kidney Disease.    Anion gap 9 5 - 15    ABGS No results for input(s): PHART, PO2ART, TCO2, HCO3 in the last 72 hours.  Invalid input(s): PCO2 CULTURES No results found for this or any previous visit (from the past 240 hour(s)). Studies/Results: Dg Chest 2 View  01/06/2016  CLINICAL DATA:  Patient with lower extremity edema. Lower extremity cellulitis. EXAM: CHEST  2 VIEW COMPARISON:  Chest  radiograph 11/06/2014; 07/01/2014. FINDINGS: Monitoring leads overlie the patient. Stable enlarged cardiac and mediastinal contours. No consolidative pulmonary opacities. No pleural effusion or pneumothorax.  Thoracic spine degenerative changes. IMPRESSION: No active cardiopulmonary disease. Electronically Signed   By: Lovey Newcomer M.D.   On: 01/06/2016 20:07    Medications: I have reviewed the patient's current medications.  Assesment:   Active Problems:   Chronic diastolic CHF (congestive heart failure) (HCC)   PAF (paroxysmal atrial fibrillation) (HCC)   Cellulitis   Hyponatremia   AKI (acute kidney injury) ([place])    Plan:  Medications reviewed Continue IV antibiotics Continue current treatment Will monitor CBC/BMP    LOS: 2 days   Yolanda [name] 01/08/2016, 10:24 AM

## 2016-01-08 NOTE — Progress Notes (Signed)
After being weighed, discovered pt has gained approximately 5 lbs. Fluids paused and notified Dr. Felecia ShellingFanta. Awaiting response.

## 2016-01-09 LAB — BASIC METABOLIC PANEL
ANION GAP: 8 (ref 5–15)
BUN: 32 mg/dL — AB (ref 6–20)
CALCIUM: 8.4 mg/dL — AB (ref 8.9–10.3)
CHLORIDE: 91 mmol/L — AB (ref 101–111)
CO2: 26 mmol/L (ref 22–32)
CREATININE: 1.34 mg/dL — AB (ref 0.44–1.00)
GFR, EST AFRICAN AMERICAN: 45 mL/min — AB (ref >60)
GFR, EST NON AFRICAN AMERICAN: 39 mL/min — AB (ref >60)
Glucose, Bld: 120 mg/dL — ABNORMAL HIGH (ref 65–99)
Potassium: 4.7 mmol/L (ref 3.5–5.1)
SODIUM: 125 mmol/L — AB (ref 135–145)

## 2016-01-09 LAB — URINE CULTURE: Culture: >=100000 — AB

## 2016-01-09 MED ORDER — ALBUTEROL SULFATE (2.5 MG/3ML) 0.083% IN NEBU
2.5000 mg | INHALATION_SOLUTION | Freq: Three times a day (TID) | RESPIRATORY_TRACT | Status: DC
  Administered 2016-01-10 – 2016-01-13 (×10): 2.5 mg via RESPIRATORY_TRACT
  Filled 2016-01-09 (×10): qty 3

## 2016-01-09 MED ORDER — GABAPENTIN 300 MG PO CAPS
600.0000 mg | ORAL_CAPSULE | Freq: Every day | ORAL | Status: DC
  Administered 2016-01-09 – 2016-01-12 (×4): 600 mg via ORAL
  Filled 2016-01-09 (×4): qty 2

## 2016-01-09 NOTE — Care Management Important Message (Signed)
Important Message  Patient Details  Name: Yolanda [name] MRN: [id number] Date of Birth: 07-14-1941   Medicare Important Message Given:  Yes    [name], [name], RN 01/09/2016, 1:56 PM

## 2016-01-09 NOTE — Progress Notes (Signed)
Subjective: Her left leg is still red and tender but no fever or chills... Objective: Vital signs in last 24 hours: Temp:  [97.9 F (36.6 C)-98.6 F (37 C)] 98.6 F (37 C) (04/17 0440) Pulse Rate:  [83-99] 99 (04/17 0440) Resp:  [18-20] 20 (04/17 0440) BP: (111-136)/(48-56) 136/56 mmHg (04/17 0440) SpO2:  [90 %-99 %] 99 % (04/17 0749) Weight:  [108.954 kg (240 lb 3.2 oz)] 108.954 kg (240 lb 3.2 oz) (04/17 0440) Weight change: 0.318 kg (11.2 oz) Last BM Date: 01/06/16  Intake/Output from previous day: 04/16 0701 - 04/17 0700 In: 440 [P.O.:440] Out: 2100 [Urine:2100]  PHYSICAL EXAM General appearance: alert and no distress Resp: diminished breath sounds bilaterally and rhonchi bilaterally Cardio: S1, S2 normal GI: soft, non-tender; bowel sounds normal; no masses,  no organomegaly Extremities: 3+++ leg edema, diffuse tender redness of the left leg.  Lab Results:  Results for orders placed or performed during the hospital encounter of 01/06/16 (from the past 48 hour(s))  CBC     Status: Abnormal   Collection Time: 01/08/16  6:06 AM  Result Value Ref Range   WBC 6.6 4.0 - 10.5 K/uL   RBC 2.81 (L) 3.87 - 5.11 MIL/uL   Hemoglobin 8.6 (L) 12.0 - 15.0 g/dL   HCT 24.9 (L) 36.0 - 46.0 %   MCV 88.6 78.0 - 100.0 fL   MCH 30.6 26.0 - 34.0 pg   MCHC 34.5 30.0 - 36.0 g/dL   RDW 16.0 (H) 11.5 - 15.5 %   Platelets 180 150 - 400 K/uL  Basic metabolic panel     Status: Abnormal   Collection Time: 01/08/16  6:06 AM  Result Value Ref Range   Sodium 124 (L) 135 - 145 mmol/L   Potassium 4.2 3.5 - 5.1 mmol/L   Chloride 92 (L) 101 - 111 mmol/L   CO2 23 22 - 32 mmol/L   Glucose, Bld 126 (H) 65 - 99 mg/dL   BUN 35 (H) 6 - 20 mg/dL   Creatinine, Ser 1.55 (H) 0.44 - 1.00 mg/dL   Calcium 8.1 (L) 8.9 - 10.3 mg/dL   GFR calc non Af Amer 32 (L) >60 mL/min   GFR calc Af Amer 37 (L) >60 mL/min    Comment: (NOTE) The eGFR has been calculated using the CKD EPI equation. This calculation has not  been validated in all clinical situations. eGFR's persistently <60 mL/min signify possible Chronic Kidney Disease.    Anion gap 9 5 - 15  Basic metabolic panel     Status: Abnormal   Collection Time: 01/09/16  5:22 AM  Result Value Ref Range   Sodium 125 (L) 135 - 145 mmol/L   Potassium 4.7 3.5 - 5.1 mmol/L   Chloride 91 (L) 101 - 111 mmol/L   CO2 26 22 - 32 mmol/L   Glucose, Bld 120 (H) 65 - 99 mg/dL   BUN 32 (H) 6 - 20 mg/dL   Creatinine, Ser 1.34 (H) 0.44 - 1.00 mg/dL   Calcium 8.4 (L) 8.9 - 10.3 mg/dL   GFR calc non Af Amer 39 (L) >60 mL/min   GFR calc Af Amer 45 (L) >60 mL/min    Comment: (NOTE) The eGFR has been calculated using the CKD EPI equation. This calculation has not been validated in all clinical situations. eGFR's persistently <60 mL/min signify possible Chronic Kidney Disease.    Anion gap 8 5 - 15    ABGS No results for input(s): PHART, PO2ART, TCO2, HCO3 in the  last 72 hours.  Invalid input(s): PCO2 CULTURES Recent Results (from the past 240 hour(s))  Urine culture     Status: Abnormal (Preliminary result)   Collection Time: 01/06/16  8:17 PM  Result Value Ref Range Status   Specimen Description URINE, CLEAN CATCH  Final   Special Requests Immunocompromised  Final   Culture >=100,000 COLONIES/mL ESCHERICHIA COLI (A)  Final   Report Status PENDING  Incomplete   Studies/Results: No results found.  Medications: I have reviewed the patient's current medications.  Assesment:   Active Problems:   Chronic diastolic CHF (congestive heart failure) (HCC)   PAF (paroxysmal atrial fibrillation) (HCC)   Cellulitis   Hyponatremia   AKI (acute kidney injury) ([place])    Plan:  Medications reviewed Continue IV antibiotics Continue current treatment Will monitor CBC/BMP    LOS: 3 days   Yolanda [name] 01/09/2016, 8:12 AM

## 2016-01-09 NOTE — Care Management Note (Signed)
Case Management Note  Patient Details  Name: Yolanda MurrainBrenda W [name] MRN: [id number] Date of Birth: Mar 30, 1941  Subjective/Objective:       Spoke with patient who is alert and oriented from home . Stated that she is independent in activities and drives self. Denies issues with medications. St[facility]. Has been open to [facility] in the past.             Action/Plan: Home with self care.   Expected Discharge Date:  01/09/16               Expected Discharge Plan:  Home/Self Care  In-House Referral:     Discharge planning Services  CM Consult  Post Acute Care Choice:    Choice offered to:     DME Arranged:    DME Agency:     HH Arranged:    HH Agency:     Status of Service:  Completed, signed off  Medicare Important Message Given:    Date Medicare IM Given:    Medicare IM give by:    Date Additional Medicare IM Given:    Additional Medicare Important Message give by:     If discussed at Long Length of Stay Meetings, dates discussed:    Additional Comments:  [name], Shaniece Bussa L, RN 01/09/2016, 1:55 PM

## 2016-01-10 ENCOUNTER — Inpatient Hospital Stay (HOSPITAL_COMMUNITY): Payer: Medicare Other

## 2016-01-10 ENCOUNTER — Encounter (HOSPITAL_COMMUNITY): Payer: Self-pay | Admitting: Cardiology

## 2016-01-10 DIAGNOSIS — I509 Heart failure, unspecified: Secondary | ICD-10-CM

## 2016-01-10 DIAGNOSIS — I5033 Acute on chronic diastolic (congestive) heart failure: Secondary | ICD-10-CM

## 2016-01-10 LAB — BASIC METABOLIC PANEL
ANION GAP: 9 (ref 5–15)
BUN: 29 mg/dL — ABNORMAL HIGH (ref 6–20)
CALCIUM: 8.6 mg/dL — AB (ref 8.9–10.3)
CO2: 26 mmol/L (ref 22–32)
Chloride: 89 mmol/L — ABNORMAL LOW (ref 101–111)
Creatinine, Ser: 1.26 mg/dL — ABNORMAL HIGH (ref 0.44–1.00)
GFR calc Af Amer: 48 mL/min — ABNORMAL LOW (ref >60)
GFR, EST NON AFRICAN AMERICAN: 42 mL/min — AB (ref >60)
Glucose, Bld: 120 mg/dL — ABNORMAL HIGH (ref 65–99)
POTASSIUM: 4.8 mmol/L (ref 3.5–5.1)
Sodium: 124 mmol/L — ABNORMAL LOW (ref 135–145)

## 2016-01-10 LAB — IRON AND TIBC
IRON: 54 ug/dL (ref 28–170)
Saturation Ratios: 12 % (ref 10.4–31.8)
TIBC: 435 ug/dL (ref 250–450)
UIBC: 381 ug/dL

## 2016-01-10 LAB — RETICULOCYTES
RBC.: 2.95 MIL/uL — AB (ref 3.87–5.11)
RETIC CT PCT: 4.9 % — AB (ref 0.4–3.1)
Retic Count, Absolute: 144.6 10*3/uL (ref 19.0–186.0)

## 2016-01-10 LAB — OCCULT BLOOD X 1 CARD TO LAB, STOOL: FECAL OCCULT BLD: NEGATIVE

## 2016-01-10 LAB — VANCOMYCIN, TROUGH: Vancomycin Tr: 22 ug/mL — ABNORMAL HIGH (ref 10.0–20.0)

## 2016-01-10 LAB — ECHOCARDIOGRAM COMPLETE
Height: 61 in
Weight: 3872 oz

## 2016-01-10 LAB — FOLATE: FOLATE: 16.1 ng/mL (ref >5.9)

## 2016-01-10 LAB — FERRITIN: Ferritin: 64 ng/mL (ref 11–307)

## 2016-01-10 MED ORDER — FUROSEMIDE 10 MG/ML IJ SOLN
40.0000 mg | Freq: Two times a day (BID) | INTRAMUSCULAR | Status: DC
  Administered 2016-01-10 – 2016-01-11 (×3): 40 mg via INTRAVENOUS
  Filled 2016-01-10 (×3): qty 4

## 2016-01-10 NOTE — Consult Note (Signed)
Requesting provider: Dr. Kari Baars Primary cardiologist: Dr. Prentice Docker Consulting cardiologist: Dr. Jonelle Sidle  Reason for consultation: Leg swelling, diastolic heart failure  Clinical Summary Yolanda [name] is a 75 y.o.female currently admitted to the hospital for treatment of left leg cellulitis and fluid overload. She has a history of chronic diastolic heart failure, last echocardiogram was in 2015 as outlined below. Diuretics have been adjusted as an outpatient, however without significant effect. She is starting to feel somewhat better, but still has leg edema that has been slow to improve. She is currently on IV Lasix per adjustment by Dr. Juanetta Gosling.  Additional history includes paroxysmal atrial fibrillation, although this is been fairly well controlled. She is on Eliquis for stroke prophylaxis, otherwise takes Cardizem CD.   Allergies  Allergen Reactions  . Ciprofloxacin Shortness Of Breath    REACTION: sob,tachycardia  . Doxycycline Nausea Only    Also experienced diarrhea   . Fish Oil     Patient face drew to the side,Bells Palsey  . Guaifenesin Shortness Of Breath    REACTION: sob,tachycardia  . Sulfamethoxazole W/Trimethoprim 800-160 [Sulfamethoxazole-Trimethoprim] Nausea Only    Also lack of appetite   . Adhesive [Tape] Other (See Comments)    Causes blisters on skin  . Tramadol     insomnia    Medications Scheduled Medications: . albuterol  2.5 mg Nebulization TID  . apixaban  5 mg Oral BID  . diltiazem  120 mg Oral Daily  . fluticasone furoate-vilanterol  1 puff Inhalation Daily  . furosemide  40 mg Intravenous Q12H  . gabapentin  600 mg Oral QHS  . levothyroxine  137 mcg Oral QAC breakfast  . loratadine  10 mg Oral Daily  . losartan  100 mg Oral Daily  . pantoprazole  40 mg Oral Daily  . sodium chloride flush  3 mL Intravenous Q12H  . vancomycin  1,250 mg Intravenous Q24H    PRN Medications: acetaminophen **OR** acetaminophen,  cyclobenzaprine, HYDROcodone-acetaminophen, ondansetron **OR** ondansetron (ZOFRAN) IV   Past Medical History  Diagnosis Date  . Hypothyroidism   . Essential hypertension   . GERD (gastroesophageal reflux disease)   . COPD (chronic obstructive pulmonary disease) (HCC)   . Bronchitis   . Barrett esophagus   . Diastolic heart failure (HCC)   . PAF (paroxysmal atrial fibrillation) [facility])     Past Surgical History  Procedure Laterality Date  . Cholecystectomy    . Colonoscopy    . Upper gastrointestinal endoscopy    . Breast biopsy Right Sept, 2012    Family History  Problem Relation Age of Onset  . Dementia Mother   . Lung cancer Father     smoked  . Hypertension Sister   . Diabetes Brother   . Obesity Sister   . Hypertension Sister   . Restless legs syndrome Sister   . Healthy Sister   . Lung cancer Maternal Uncle     Social History Yolanda [name] reports that she quit smoking about 16 years ago. Her smoking use included Cigarettes. She started smoking about 57 years ago. She has a 15 pack-year smoking history. She has never used smokeless tobacco. Yolanda [name] reports that she does not drink alcohol.  Review of Systems Complete review of systems negative except as otherwise outlined in the clinical summary and also the following. Intermittent gout, decreased urine output.  Physical Examination Blood pressure 140/53, pulse 90, temperature 98 F (36.7 C), temperature source Oral, resp. rate 18, height  (  1.549 m), weight 242 lb (109.77 kg), SpO2 96 %.  Intake/Output Summary (Last 24 hours) at 01/10/16 1103 Last data filed at 01/10/16 0800  Gross per 24 hour  Intake    720 ml  Output   2100 ml  Net  -1380 ml   Telemetry: Sinus rhythm.  Gen.: Obese woman in no distress. HEENT: Conjunctiva and lids normal, oropharynx clear. Neck: Supple, increased girth without carotid bruits, no thyromegaly. Lungs: Diminished breath sounds without wheezing, nonlabored breathing at  rest. Cardiac: Distant , regular rate and rhythm, no S3, no pericardial rub. Abdomen: Soft, nontender, bowel sounds present, no guarding or rebound. Extremities: Chronic appearing edema with venous stasis and erythema of left lower leg, distal pulses 1-2+. Skin: Warm and dry. Musculoskeletal: No kyphosis. Neuropsychiatric: Alert and oriented x3, affect grossly appropriate.  Lab Results  Basic Metabolic Panel:  Recent Labs Lab 01/06/16 1900 01/07/16 0440 01/08/16 0606 01/09/16 0522 01/10/16 0520  NA 119* 124* 124* 125* 124*  K 4.4 3.8 4.2 4.7 4.8  CL 84* 89* 92* 91* 89*  CO2 22 26 23 26 26   GLUCOSE 187* 136* 126* 120* 120*  BUN 39* 36* 35* 32* 29*  CREATININE 1.82* 1.45* 1.55* 1.34* 1.26*  CALCIUM 8.3* 8.2* 8.1* 8.4* 8.6*    CBC:  Recent Labs Lab 01/06/16 1900 01/08/16 0606  WBC 9.1 6.6  HGB 9.3* 8.6*  HCT 26.4* 24.9*  MCV 87.1 88.6  PLT 200 180    Cardiac Enzymes:  Recent Labs Lab 01/06/16 1900  TROPONINI <0.03    BNP: 49  ECG I personally reviewed the recent tracing showing sinus rhythm with incomplete right bundle-branch block and nonspecific ST changes.  Imaging  Echocardiogram 12/30/2013: Study Conclusions  - Procedure narrative: Transthoracic echocardiography. Image quality was suboptimal. The study was technically difficult, as a result of poor sound wave transmission and body habitus. - Left ventricle: The cavity size was normal. Wall thickness was increased in a pattern of mild LVH. Systolic function was normal. The estimated ejection fraction was in the range of 60% to 65%. Wall motion was normal; there were no regional wall motion abnormalities. There was an increased relative contribution of atrial contraction to ventricular filling. Doppler parameters are consistent with abnormal left ventricular relaxation (grade 1 diastolic dysfunction). Doppler parameters are consistent with high ventricular filling  pressure. - Mitral valve: Mildly thickened leaflets . - Pericardium, extracardiac: A trivial pericardial effusion was identified.  Impression  1. Volume overnight and leg edema, presumably acute on chronic diastolic heart failure, last echocardiogram was in 2015. Could also be component of RV dysfunction, but this was not noted by the prior study. Agree with transition to IV Lasix. Follow-up echocardiogram will be obtained to reassess cardiac structure and function.  2. Paroxysmal atrial fibrillation, maintaining sinus rhythm. She continues on Eliquis and Cardizem CD.  3. Essential hypertension, blood pressure control is reasonable.  4. Left leg cellulitis, continues on antibiotics per Dr. Juanetta GoslingHawkins.  Recommendations  Agree with transition to IV Lasix. We will follow-up with an echocardiogram as well to reassess LV and RV function. No other changes made to remaining regimen at this time.  Jonelle SidleSamuel G. Yailin Biederman, M.D., F.A.C.C.

## 2016-01-10 NOTE — Progress Notes (Signed)
Subjective: She was admitted with cellulitis of her left leg. I have been treating her in my office for heart failure. She developed rather suddenly significant swelling in her lower extremities and was treated with diuretics which I have been adjusting as an outpatient. About 48 hours prior to admission she developed erythema and warmth of her left lower extremity and she was started on antibiotics. However she continued having swelling and continued having symptoms of cellulitis and eventually presented to the emergency department and she was admitted from there.  Objective: Vital signs in last 24 hours: Temp:  [98 F (36.7 C)-98.5 F (36.9 C)] 98 F (36.7 C) (04/18 0500) Pulse Rate:  [88-94] 90 (04/18 0500) Resp:  [18-21] 18 (04/18 0500) BP: (127-130)/(42-64) 130/60 mmHg (04/18 0500) SpO2:  [92 %-98 %] 96 % (04/18 0803) Weight:  [109.77 kg (242 lb)] 109.77 kg (242 lb) (04/18 0848) Weight change:  Last BM Date: 01/06/16  Intake/Output from previous day: 04/17 0701 - 04/18 0700 In: 480 [P.O.:480] Out: 2100 [Urine:2100]  PHYSICAL EXAM General appearance: alert, cooperative and mild distress Resp: clear to auscultation bilaterally Cardio: regular rate and rhythm, S1, S2 normal, no murmur, click, rub or gallop GI: soft, non-tender; bowel sounds normal; no masses,  no organomegaly Extremities: She still has erythema of her leg and still has significantly more edema than her baseline  Lab Results:  Results for orders placed or performed during the hospital encounter of 01/06/16 (from the past 48 hour(s))  Basic metabolic panel     Status: Abnormal   Collection Time: 01/09/16  5:22 AM  Result Value Ref Range   Sodium 125 (L) 135 - 145 mmol/L   Potassium 4.7 3.5 - 5.1 mmol/L   Chloride 91 (L) 101 - 111 mmol/L   CO2 26 22 - 32 mmol/L   Glucose, Bld 120 (H) 65 - 99 mg/dL   BUN 32 (H) 6 - 20 mg/dL   Creatinine, Ser 6.61 (H) 0.44 - 1.00 mg/dL   Calcium 8.4 (L) 8.9 - 10.3 mg/dL   GFR  calc non Af Amer 39 (L) >60 mL/min   GFR calc Af Amer 45 (L) >60 mL/min    Comment: (NOTE) The eGFR has been calculated using the CKD EPI equation. This calculation has not been validated in all clinical situations. eGFR's persistently <60 mL/min signify possible Chronic Kidney Disease.    Anion gap 8 5 - 15  Basic metabolic panel     Status: Abnormal   Collection Time: 01/10/16  5:20 AM  Result Value Ref Range   Sodium 124 (L) 135 - 145 mmol/L   Potassium 4.8 3.5 - 5.1 mmol/L   Chloride 89 (L) 101 - 111 mmol/L   CO2 26 22 - 32 mmol/L   Glucose, Bld 120 (H) 65 - 99 mg/dL   BUN 29 (H) 6 - 20 mg/dL   Creatinine, Ser 9.82 (H) 0.44 - 1.00 mg/dL   Calcium 8.6 (L) 8.9 - 10.3 mg/dL   GFR calc non Af Amer 42 (L) >60 mL/min   GFR calc Af Amer 48 (L) >60 mL/min    Comment: (NOTE) The eGFR has been calculated using the CKD EPI equation. This calculation has not been validated in all clinical situations. eGFR's persistently <60 mL/min signify possible Chronic Kidney Disease.    Anion gap 9 5 - 15    ABGS No results for input(s): PHART, PO2ART, TCO2, HCO3 in the last 72 hours.  Invalid input(s): PCO2 CULTURES Recent Results (from the past  240 hour(s))  Urine culture     Status: Abnormal   Collection Time: 01/06/16  8:17 PM  Result Value Ref Range Status   Specimen Description URINE, CLEAN CATCH  Final   Special Requests Immunocompromised  Final   Culture >=100,000 COLONIES/mL ESCHERICHIA COLI (A)  Final   Report Status 01/09/2016 FINAL  Final   Organism ID, Bacteria ESCHERICHIA COLI (A)  Final      Susceptibility   Escherichia coli - MIC*    AMPICILLIN 8 SENSITIVE Sensitive     CEFAZOLIN <=4 SENSITIVE Sensitive     CEFTRIAXONE <=1 SENSITIVE Sensitive     CIPROFLOXACIN <=0.25 SENSITIVE Sensitive     GENTAMICIN <=1 SENSITIVE Sensitive     IMIPENEM <=0.25 SENSITIVE Sensitive     NITROFURANTOIN <=16 SENSITIVE Sensitive     TRIMETH/SULFA <=20 SENSITIVE Sensitive      AMPICILLIN/SULBACTAM 4 SENSITIVE Sensitive     PIP/TAZO <=4 SENSITIVE Sensitive     * >=100,000 COLONIES/mL ESCHERICHIA COLI   Studies/Results: No results found.  Medications:  Prior to Admission:  Prescriptions prior to admission  Medication Sig Dispense Refill Last Dose  . acetaminophen (TYLENOL) 500 MG tablet Take 500-1,000 mg by mouth as needed for mild pain.    Past Week at Unknown time  . albuterol (PROVENTIL HFA;VENTOLIN HFA) 108 (90 Base) MCG/ACT inhaler Inhale 2 puffs into the lungs every 6 (six) hours as needed for wheezing or shortness of breath.   01/06/2016 at Unknown time  . apixaban (ELIQUIS) 5 MG TABS tablet Take 1 tablet (5 mg total) by mouth 2 (two) times daily. 60 tablet 5 01/06/2016 at 800  . cetirizine (ZYRTEC) 10 MG tablet Take 10 mg by mouth daily.   01/06/2016 at Unknown time  . clindamycin (CLEOCIN) 150 MG capsule Take 150 mg by mouth 3 (three) times daily.   01/06/2016 at Unknown time  . diltiazem (CARDIZEM CD) 120 MG 24 hr capsule Take 1 capsule (120 mg total) by mouth daily. 90 capsule 3 01/06/2016 at Unknown time  . famotidine (PEPCID) 20 MG tablet One at bedtime (Patient taking differently: Take 20 mg by mouth at bedtime. ) 30 tablet 2 01/05/2016 at Unknown time  . febuxostat (ULORIC) 40 MG tablet Take 40 mg by mouth daily.   Past Month at Unknown time  . flintstones complete (FLINTSTONES) 60 MG chewable tablet Chew 3 tablets by mouth daily.   Past Week at Unknown time  . Fluticasone Furoate-Vilanterol 200-25 MCG/INH AEPB Inhale 1 puff into the lungs daily.   01/06/2016 at Unknown time  . gabapentin (NEURONTIN) 300 MG capsule Take 300 mg by mouth at bedtime. 1-2 tabs at bedtime restless leg syndrome   01/05/2016 at Unknown time  . levalbuterol (XOPENEX) 0.63 MG/3ML nebulizer solution Take 3 mLs (0.63 mg total) by nebulization every 6 (six) hours as needed for wheezing or shortness of breath. 90 mL 12 Past Week at Unknown time  . levothyroxine (SYNTHROID, LEVOTHROID) 137  MCG tablet Take 137 mcg by mouth daily before breakfast.   01/06/2016 at Unknown time  . losartan (COZAAR) 100 MG tablet Take 100 mg by mouth daily.   01/06/2016 at Unknown time  . magnesium 30 MG tablet Take 30 mg by mouth 2 (two) times daily.   01/06/2016  . metolazone (ZAROXOLYN) 5 MG tablet Take 5 mg by mouth 2 (two) times daily.   01/06/2016 at Unknown time  . omeprazole (PRILOSEC) 20 MG capsule Take 1 capsule (20 mg total) by mouth daily. 90 capsule  3 01/06/2016 at Unknown time  . potassium chloride SA (K-DUR,KLOR-CON) 20 MEQ tablet Take 1 tablet (20 mEq total) by mouth daily. (Patient taking differently: Take 20 mEq by mouth every other day. ) 30 tablet 6 01/05/2016 at Unknown time  . torsemide (DEMADEX) 20 MG tablet Take 60 mg by mouth 2 (two) times daily.   12 01/06/2016 at Unknown time  . triamcinolone (NASACORT ALLERGY 24HR) 55 MCG/ACT AERO nasal inhaler Place 2 sprays into the nose daily.   01/07/2016 at Unknown time  . VOLTAREN 1 % GEL Apply 1 application topically daily as needed.  0 Past Week at Unknown time   Scheduled: . albuterol  2.5 mg Nebulization TID  . apixaban  5 mg Oral BID  . diltiazem  120 mg Oral Daily  . fluticasone furoate-vilanterol  1 puff Inhalation Daily  . furosemide  40 mg Intravenous Q12H  . gabapentin  600 mg Oral QHS  . levothyroxine  137 mcg Oral QAC breakfast  . loratadine  10 mg Oral Daily  . losartan  100 mg Oral Daily  . pantoprazole  40 mg Oral Daily  . sodium chloride flush  3 mL Intravenous Q12H  . vancomycin  1,250 mg Intravenous Q24H   Continuous:  [facility]:[id number] **OR** acetaminophen, cyclobenzaprine, HYDROcodone-acetaminophen, ondansetron **OR** ondansetron (ZOFRAN) IV  Assesment: She was admitted with cellulitis. She has acute kidney injury. She has acute on chronic diastolic heart failure. She is improving but still has significant swelling of her legs. Active Problems:   Chronic diastolic CHF (congestive heart failure) (HCC)   PAF  (paroxysmal atrial fibrillation) (HCC)   Cellulitis   Hyponatremia   AKI (acute kidney injury) ([place])    Plan: I'm going to switch her to IV Lasix. We will need to watch her sodium level closely. Continue antibiotics. I have requested cardiology consultation.    LOS: 4 days   Keyon Winnick L 01/10/2016, 8:56 AM

## 2016-01-11 LAB — BASIC METABOLIC PANEL
ANION GAP: 7 (ref 5–15)
BUN: 26 mg/dL — ABNORMAL HIGH (ref 6–20)
CHLORIDE: 86 mmol/L — AB (ref 101–111)
CO2: 28 mmol/L (ref 22–32)
Calcium: 8.9 mg/dL (ref 8.9–10.3)
Creatinine, Ser: 1.16 mg/dL — ABNORMAL HIGH (ref 0.44–1.00)
GFR calc Af Amer: 53 mL/min — ABNORMAL LOW (ref >60)
GFR, EST NON AFRICAN AMERICAN: 46 mL/min — AB (ref >60)
Glucose, Bld: 145 mg/dL — ABNORMAL HIGH (ref 65–99)
POTASSIUM: 4.4 mmol/L (ref 3.5–5.1)
SODIUM: 121 mmol/L — AB (ref 135–145)

## 2016-01-11 MED ORDER — VANCOMYCIN HCL IN DEXTROSE 750-5 MG/150ML-% IV SOLN
750.0000 mg | INTRAVENOUS | Status: DC
  Administered 2016-01-11 – 2016-01-12 (×2): 750 mg via INTRAVENOUS
  Filled 2016-01-11 (×6): qty 150

## 2016-01-11 NOTE — Progress Notes (Signed)
ANTIBIOTIC CONSULT NOTE - Follow up  Pharmacy Consult for Vancomycin Indication: cellulitis  Allergies  Allergen Reactions  . Ciprofloxacin Shortness Of Breath    REACTION: sob,tachycardia  . Doxycycline Nausea Only    Also experienced diarrhea   . Fish Oil     Patient face drew to the side,Bells Palsey  . Guaifenesin Shortness Of Breath    REACTION: sob,tachycardia  . Sulfamethoxazole W/Trimethoprim 800-160 [Sulfamethoxazole-Trimethoprim] Nausea Only    Also lack of appetite   . Adhesive [Tape] Other (See Comments)    Causes blisters on skin  . Tramadol     insomnia    Patient Measurements: Height:  (154.9 cm) Weight: 242 lb (109.77 kg) IBW/kg (Calculated) : 47.8  Vital Signs: Temp: 98.2 F (36.8 C) (04/19 0558) Temp Source: Oral (04/19 0558) BP: 125/55 mmHg (04/19 0558) Pulse Rate: 89 (04/19 0558) Intake/Output from previous day: 04/18 0701 - 04/19 0700 In: 720 [P.O.:720] Out: 1950 [Urine:1950] Intake/Output from this shift:    Labs:  Recent Labs  01/09/16 0522 01/10/16 0520 01/11/16 0507  CREATININE 1.34* 1.26* 1.16*   Estimated Creatinine Clearance: 50.2 mL/min (by C-G formula based on Cr of 1.16).  Recent Labs  01/10/16 2030  VANCOTROUGH 22*     Microbiology: Recent Results (from the past 720 hour(s))  Urine culture     Status: Abnormal   Collection Time: 01/06/16  8:17 PM  Result Value Ref Range Status   Specimen Description URINE, CLEAN CATCH  Final   Special Requests Immunocompromised  Final   Culture >=100,000 COLONIES/mL ESCHERICHIA COLI (A)  Final   Report Status 01/09/2016 FINAL  Final   Organism ID, Bacteria ESCHERICHIA COLI (A)  Final      Susceptibility   Escherichia coli - MIC*    AMPICILLIN 8 SENSITIVE Sensitive     CEFAZOLIN <=4 SENSITIVE Sensitive     CEFTRIAXONE <=1 SENSITIVE Sensitive     CIPROFLOXACIN <=0.25 SENSITIVE Sensitive     GENTAMICIN <=1 SENSITIVE Sensitive     IMIPENEM <=0.25 SENSITIVE Sensitive    NITROFURANTOIN <=16 SENSITIVE Sensitive     TRIMETH/SULFA <=20 SENSITIVE Sensitive     AMPICILLIN/SULBACTAM 4 SENSITIVE Sensitive     PIP/TAZO <=4 SENSITIVE Sensitive     * >=100,000 COLONIES/mL ESCHERICHIA COLI    Medical History: Past Medical History  Diagnosis Date  . Hypothyroidism   . Essential hypertension   . GERD (gastroesophageal reflux disease)   . COPD (chronic obstructive pulmonary disease) (HCC)   . Bronchitis   . Barrett esophagus   . Diastolic heart failure (HCC)   . PAF (paroxysmal atrial fibrillation) (HCC)     Medications:  Scheduled:  . albuterol  2.5 mg Nebulization TID  . apixaban  5 mg Oral BID  . diltiazem  120 mg Oral Daily  . fluticasone furoate-vilanterol  1 puff Inhalation Daily  . furosemide  40 mg Intravenous Q12H  . gabapentin  600 mg Oral QHS  . levothyroxine  137 mcg Oral QAC breakfast  . loratadine  10 mg Oral Daily  . losartan  100 mg Oral Daily  . pantoprazole  40 mg Oral Daily  . sodium chloride flush  3 mL Intravenous Q12H  . vancomycin  1,250 mg Intravenous Q24H   Assessment: 75 yo female ED patient, cellulitis left leg Reduced renal function, normalized CrCl 39.4 ml/min Obese patient 106.2 kg. Vancomycin trough drawn a little early reported as 54mcg/ml. Will adjust dose.  Goal of Therapy:  Vancomycin trough level 10-15 mcg/ml  Plan:  Change Vancomycin dose to 750 mg IV every 24 hours Vancomycin levels as warranted Monitor renal function  Elder CyphersLorie Ercel Pepitone, BS Loura BackPharm D, BCPS Clinical Pharmacist Pager [phone] 01/11/2016,7:37 AM

## 2016-01-11 NOTE — Progress Notes (Signed)
Primary cardiologist: Dr. Prentice Docker  Seen for followup: Leg edema, diastolic heart failure  Subjective:    Sitting in bed side chair. Complains of stinging in her left leg in the region of cellulitis. No breathlessness or chest pain.   Objective:   Temp:  [97.5 F (36.4 C)-98.2 F (36.8 C)] 98.2 F (36.8 C) (04/19 0558) Pulse Rate:  [89-93] 89 (04/19 0558) Resp:  [18-20] 20 (04/19 0558) BP: (123-140)/(46-55) 125/55 mmHg (04/19 0558) SpO2:  [94 %-98 %] 98 % (04/19 0755) Last BM Date: 01/10/16  Filed Weights   01/08/16 0454 01/09/16 0440 01/10/16 0848  Weight: 239 lb 8 oz (108.636 kg) 240 lb 3.2 oz (108.954 kg) 242 lb (109.77 kg)    Intake/Output Summary (Last 24 hours) at 01/11/16 0941 Last data filed at 01/11/16 0825  Gross per 24 hour  Intake    720 ml  Output   1950 ml  Net  -1230 ml    Telemetry: Sinus rhythm.  Exam:  General: Obese woman, appears comfortable.  Lungs: Diminished breath sounds without wheezing.  Cardiac: RRR without gallop.  Extremities: Chronic appearing leg edema and venous stasis with erythema of left lower leg.  Lab Results:  Basic Metabolic Panel:  Recent Labs Lab 01/09/16 0522 01/10/16 0520 01/11/16 0507  NA 125* 124* 121*  K 4.7 4.8 4.4  CL 91* 89* 86*  CO2 GLUCOSE 120* 120* 145*  BUN 32* 29* 26*  CREATININE 1.34* 1.26* 1.16*  CALCIUM 8.4* 8.6* 8.9    CBC:  Recent Labs Lab 01/06/16 1900 01/08/16 0606  WBC 9.1 6.6  HGB 9.3* 8.6*  HCT 26.4* 24.9*  MCV 87.1 88.6  PLT 200 180    Cardiac Enzymes:  Recent Labs Lab 01/06/16 1900  TROPONINI <0.03    Echocardiogram 01/10/2016: Study Conclusions  - Left ventricle: The cavity size was normal. Wall thickness was  increased in a pattern of mild LVH. Systolic function was  vigorous. The estimated ejection fraction was in the range of 65%  to 70%. Wall motion was normal; there were no regional wall  motion abnormalities. Features are  consistent with a pseudonormal  left ventricular filling pattern, with concomitant abnormal  relaxation and increased filling pressure (grade 2 diastolic  dysfunction). - Aortic valve: Mildly calcified annulus. Trileaflet. Mean gradient  (S): 9 mm Hg. Peak gradient (S): 21 mm Hg. - Mitral valve: There was trivial regurgitation. - Right atrium: Central venous pressure (est): 3 mm Hg. - Tricuspid valve: There was trivial regurgitation. - Pulmonary arteries: PA peak pressure: 32 mm Hg (S). - Pericardium, extracardiac: A prominent pericardial fat pad was  present.  Impressions:  - Mild LVH with LVEF 65-70%. Grade 2 diastolic dysfunction with  increased LV filling pressure. Trivial mitral regurgitation.  Sclerotic aortic valve without stenosis. Trivial tricuspid  regurgitation with PASP 32 mmHg. Prominent pericardial fat pad.  Medications:   Scheduled Medications: . albuterol  2.5 mg Nebulization TID  . apixaban  5 mg Oral BID  . diltiazem  120 mg Oral Daily  . fluticasone furoate-vilanterol  1 puff Inhalation Daily  . furosemide  40 mg Intravenous Q12H  . gabapentin  600 mg Oral QHS  . levothyroxine  137 mcg Oral QAC breakfast  . loratadine  10 mg Oral Daily  . losartan  100 mg Oral Daily  . pantoprazole  40 mg Oral Daily  . sodium chloride flush  3 mL Intravenous Q12H  . vancomycin  750 mg Intravenous  Q24H     PRN Medications:  acetaminophen **OR** acetaminophen, cyclobenzaprine, HYDROcodone-acetaminophen, ondansetron **OR** ondansetron (ZOFRAN) IV   Assessment:   1. Acute on chronic diastolic heart failure. Follow-up echocardiogram shows LVEF 65-70% with grade 2 diastolic dysfunction. She is diuresing on IV Lasix, additional 1200 cc out last 24 hours. Leg edema slowly improving.  2. Paroxysmal atrial fibrillation, maintaining sinus rhythm. She continues on Eliquis and Cardizem CD.  3. Essential hypertension, blood pressure control is reasonable.  4. Left leg  cellulitis, continues on antibiotics per Dr. Juanetta GoslingHawkins.   Plan/Discussion:    No changes made to current regimen.   Jonelle SidleSamuel G. Chaye Misch, M.D., F.A.C.C.

## 2016-01-11 NOTE — Progress Notes (Signed)
Subjective: She says she still doesn't feel well. She has increased swelling of her legs. She has more inflammation of her legs as well.  Objective: Vital signs in last 24 hours: Temp:  [97.5 F (36.4 C)-98.2 F (36.8 C)] 98.2 F (36.8 C) (04/19 0558) Pulse Rate:  [89-93] 89 (04/19 0558) Resp:  [18-20] 20 (04/19 0558) BP: (123-140)/(46-55) 125/55 mmHg (04/19 0558) SpO2:  [94 %-98 %] 98 % (04/19 0755) Weight change:  Last BM Date: 01/10/16  Intake/Output from previous day: 04/18 0701 - 04/19 0700 In: 720 [P.O.:720] Out: 1950 [Urine:1950]  PHYSICAL EXAM General appearance: alert, cooperative and mild distress Resp: clear to auscultation bilaterally Cardio: regular rate and rhythm, S1, S2 normal, no murmur, click, rub or gallop GI: soft, non-tender; bowel sounds normal; no masses,  no organomegaly Extremities: 2+ edema and erythema left more than right  Lab Results:  Results for orders placed or performed during the hospital encounter of 01/06/16 (from the past 48 hour(s))  Basic metabolic panel     Status: Abnormal   Collection Time: 01/10/16  5:20 AM  Result Value Ref Range   Sodium 124 (L) 135 - 145 mmol/L   Potassium 4.8 3.5 - 5.1 mmol/L   Chloride 89 (L) 101 - 111 mmol/L   CO2 26 22 - 32 mmol/L   Glucose, Bld 120 (H) 65 - 99 mg/dL   BUN 29 (H) 6 - 20 mg/dL   Creatinine, Ser 1.26 (H) 0.44 - 1.00 mg/dL   Calcium 8.6 (L) 8.9 - 10.3 mg/dL   GFR calc non Af Amer 42 (L) >60 mL/min   GFR calc Af Amer 48 (L) >60 mL/min    Comment: (NOTE) The eGFR has been calculated using the CKD EPI equation. This calculation has not been validated in all clinical situations. eGFR's persistently <60 mL/min signify possible Chronic Kidney Disease.    Anion gap 9 5 - 15  Folate     Status: None   Collection Time: 01/10/16  9:32 AM  Result Value Ref Range   Folate 16.1 >5.9 ng/mL    Comment: Performed at [facility]  Iron and TIBC     Status: None   Collection Time: 01/10/16   9:32 AM  Result Value Ref Range   Iron 54 28 - 170 ug/dL   TIBC 435 250 - 450 ug/dL   Saturation Ratios 12 10.4 - 31.8 %   UIBC 381 ug/dL    Comment: Performed at [facility]  Ferritin     Status: None   Collection Time: 01/10/16  9:32 AM  Result Value Ref Range   Ferritin 64 11 - 307 ng/mL    Comment: Performed at [facility]  Reticulocytes     Status: Abnormal   Collection Time: 01/10/16  9:32 AM  Result Value Ref Range   Retic Ct Pct 4.9 (H) 0.4 - 3.1 %   RBC. 2.95 (L) 3.87 - 5.11 MIL/uL   Retic Count, Manual 144.6 19.0 - 186.0 K/uL  Occult blood card to lab, stool RN will collect     Status: None   Collection Time: 01/10/16  9:39 AM  Result Value Ref Range   Fecal Occult Bld NEGATIVE NEGATIVE  Vancomycin, trough     Status: Abnormal   Collection Time: 01/10/16  8:30 PM  Result Value Ref Range   Vancomycin Tr 22 (H) 10.0 - 20.0 ug/mL  Basic metabolic panel     Status: Abnormal   Collection Time: 01/11/16  5:07  AM  Result Value Ref Range   Sodium 121 (L) 135 - 145 mmol/L   Potassium 4.4 3.5 - 5.1 mmol/L   Chloride 86 (L) 101 - 111 mmol/L   CO2 28 22 - 32 mmol/L   Glucose, Bld 145 (H) 65 - 99 mg/dL   BUN 26 (H) 6 - 20 mg/dL   Creatinine, Ser 1.16 (H) 0.44 - 1.00 mg/dL   Calcium 8.9 8.9 - 10.3 mg/dL   GFR calc non Af Amer 46 (L) >60 mL/min   GFR calc Af Amer 53 (L) >60 mL/min    Comment: (NOTE) The eGFR has been calculated using the CKD EPI equation. This calculation has not been validated in all clinical situations. eGFR's persistently <60 mL/min signify possible Chronic Kidney Disease.    Anion gap 7 5 - 15    ABGS No results for input(s): PHART, PO2ART, TCO2, HCO3 in the last 72 hours.  Invalid input(s): PCO2 CULTURES Recent Results (from the past 240 hour(s))  Urine culture     Status: Abnormal   Collection Time: 01/06/16  8:17 PM  Result Value Ref Range Status   Specimen Description URINE, CLEAN CATCH  Final   Special Requests  Immunocompromised  Final   Culture >=100,000 COLONIES/mL ESCHERICHIA COLI (A)  Final   Report Status 01/09/2016 FINAL  Final   Organism ID, Bacteria ESCHERICHIA COLI (A)  Final      Susceptibility   Escherichia coli - MIC*    AMPICILLIN 8 SENSITIVE Sensitive     CEFAZOLIN <=4 SENSITIVE Sensitive     CEFTRIAXONE <=1 SENSITIVE Sensitive     CIPROFLOXACIN <=0.25 SENSITIVE Sensitive     GENTAMICIN <=1 SENSITIVE Sensitive     IMIPENEM <=0.25 SENSITIVE Sensitive     NITROFURANTOIN <=16 SENSITIVE Sensitive     TRIMETH/SULFA <=20 SENSITIVE Sensitive     AMPICILLIN/SULBACTAM 4 SENSITIVE Sensitive     PIP/TAZO <=4 SENSITIVE Sensitive     * >=100,000 COLONIES/mL ESCHERICHIA COLI   Studies/Results: No results found.  Medications:  Prior to Admission:  Prescriptions prior to admission  Medication Sig Dispense Refill Last Dose  . acetaminophen (TYLENOL) 500 MG tablet Take 500-1,000 mg by mouth as needed for mild pain.    Past Week at Unknown time  . albuterol (PROVENTIL HFA;VENTOLIN HFA) 108 (90 Base) MCG/ACT inhaler Inhale 2 puffs into the lungs every 6 (six) hours as needed for wheezing or shortness of breath.   01/06/2016 at Unknown time  . apixaban (ELIQUIS) 5 MG TABS tablet Take 1 tablet (5 mg total) by mouth 2 (two) times daily. 60 tablet 5 01/06/2016 at 800  . cetirizine (ZYRTEC) 10 MG tablet Take 10 mg by mouth daily.   01/06/2016 at Unknown time  . clindamycin (CLEOCIN) 150 MG capsule Take 150 mg by mouth 3 (three) times daily.   01/06/2016 at Unknown time  . diltiazem (CARDIZEM CD) 120 MG 24 hr capsule Take 1 capsule (120 mg total) by mouth daily. 90 capsule 3 01/06/2016 at Unknown time  . famotidine (PEPCID) 20 MG tablet One at bedtime (Patient taking differently: Take 20 mg by mouth at bedtime. ) 30 tablet 2 01/05/2016 at Unknown time  . febuxostat (ULORIC) 40 MG tablet Take 40 mg by mouth daily.   Past Month at Unknown time  . flintstones complete (FLINTSTONES) 60 MG chewable tablet Chew 3  tablets by mouth daily.   Past Week at Unknown time  . Fluticasone Furoate-Vilanterol 200-25 MCG/INH AEPB Inhale 1 puff into the lungs daily.  01/06/2016 at Unknown time  . gabapentin (NEURONTIN) 300 MG capsule Take 300 mg by mouth at bedtime. 1-2 tabs at bedtime restless leg syndrome   01/05/2016 at Unknown time  . levalbuterol (XOPENEX) 0.63 MG/3ML nebulizer solution Take 3 mLs (0.63 mg total) by nebulization every 6 (six) hours as needed for wheezing or shortness of breath. 90 mL 12 Past Week at Unknown time  . levothyroxine (SYNTHROID, LEVOTHROID) 137 MCG tablet Take 137 mcg by mouth daily before breakfast.   01/06/2016 at Unknown time  . losartan (COZAAR) 100 MG tablet Take 100 mg by mouth daily.   01/06/2016 at Unknown time  . magnesium 30 MG tablet Take 30 mg by mouth 2 (two) times daily.   01/06/2016  . metolazone (ZAROXOLYN) 5 MG tablet Take 5 mg by mouth 2 (two) times daily.   01/06/2016 at Unknown time  . omeprazole (PRILOSEC) 20 MG capsule Take 1 capsule (20 mg total) by mouth daily. 90 capsule 3 01/06/2016 at Unknown time  . potassium chloride SA (K-DUR,KLOR-CON) 20 MEQ tablet Take 1 tablet (20 mEq total) by mouth daily. (Patient taking differently: Take 20 mEq by mouth every other day. ) 30 tablet 6 01/05/2016 at Unknown time  . torsemide (DEMADEX) 20 MG tablet Take 60 mg by mouth 2 (two) times daily.   12 01/06/2016 at Unknown time  . triamcinolone (NASACORT ALLERGY 24HR) 55 MCG/ACT AERO nasal inhaler Place 2 sprays into the nose daily.   01/07/2016 at Unknown time  . VOLTAREN 1 % GEL Apply 1 application topically daily as needed.  0 Past Week at Unknown time   Scheduled: . albuterol  2.5 mg Nebulization TID  . apixaban  5 mg Oral BID  . diltiazem  120 mg Oral Daily  . fluticasone furoate-vilanterol  1 puff Inhalation Daily  . furosemide  40 mg Intravenous Q12H  . gabapentin  600 mg Oral QHS  . levothyroxine  137 mcg Oral QAC breakfast  . loratadine  10 mg Oral Daily  . losartan  100 mg  Oral Daily  . pantoprazole  40 mg Oral Daily  . sodium chloride flush  3 mL Intravenous Q12H  . vancomycin  750 mg Intravenous Q24H   Continuous:  WIO:[id number] **OR** acetaminophen, cyclobenzaprine, HYDROcodone-acetaminophen, ondansetron **OR** ondansetron (ZOFRAN) IV  Assesment: She was admitted with acute on chronic diastolic heart failure. She has cellulitis of her legs. This is not responding to current therapy. Active Problems:   Chronic diastolic CHF (congestive heart failure) (HCC)   PAF (paroxysmal atrial fibrillation) (HCC)   Cellulitis   Hyponatremia   AKI (acute kidney injury) ([place])    Plan: Continue IV diuresis. Add Zosyn. Continue other treatments.    LOS: 5 days   Teri Legacy L 01/11/2016, 9:10 AM

## 2016-01-12 LAB — BASIC METABOLIC PANEL
ANION GAP: 10 (ref 5–15)
BUN: 24 mg/dL — ABNORMAL HIGH (ref 6–20)
CHLORIDE: 82 mmol/L — AB (ref 101–111)
CO2: 28 mmol/L (ref 22–32)
CREATININE: 1.35 mg/dL — AB (ref 0.44–1.00)
Calcium: 9 mg/dL (ref 8.9–10.3)
GFR calc non Af Amer: 38 mL/min — ABNORMAL LOW (ref >60)
GFR, EST AFRICAN AMERICAN: 44 mL/min — AB (ref >60)
Glucose, Bld: 112 mg/dL — ABNORMAL HIGH (ref 65–99)
POTASSIUM: 4.6 mmol/L (ref 3.5–5.1)
SODIUM: 120 mmol/L — AB (ref 135–145)

## 2016-01-12 MED ORDER — FUROSEMIDE 10 MG/ML IJ SOLN
40.0000 mg | Freq: Two times a day (BID) | INTRAMUSCULAR | Status: DC
  Administered 2016-01-12: 40 mg via INTRAVENOUS
  Filled 2016-01-12: qty 4

## 2016-01-12 MED ORDER — ALPRAZOLAM 0.25 MG PO TABS: 0.2500 mg | ORAL_TABLET | Freq: Two times a day (BID) | ORAL | Status: DC | PRN

## 2016-01-12 MED ORDER — LOSARTAN POTASSIUM 50 MG PO TABS
50.0000 mg | ORAL_TABLET | Freq: Every day | ORAL | Status: DC
  Administered 2016-01-12 – 2016-01-13 (×2): 50 mg via ORAL
  Filled 2016-01-12 (×2): qty 1

## 2016-01-12 NOTE — Progress Notes (Addendum)
Primary Cardiologist: Prentice DockerKoneswaran, Suresh MD  Cardiology Specific Problem List:  1. Acute on Chronic Diastolic CHF 2. Paroxysmal Atrial fibrillation 3. Hypertension  Subjective:    Difficult to arouse. Sleeping.   Objective:   Temp:  [97.8 F (36.6 C)-98.8 F (37.1 C)] 98.8 F (37.1 C) (04/20 0545) Pulse Rate:  [71-95] 95 (04/20 0545) Resp:  [20] 20 (04/20 0545) BP: (92-141)/(40-77) 104/54 mmHg (04/20 0545) SpO2:  [91 %-98 %] 91 % (04/20 0545) Last BM Date: 01/11/16  Filed Weights   01/08/16 0454 01/09/16 0440 01/10/16 0848  Weight: 239 lb 8 oz (108.636 kg) 240 lb 3.2 oz (108.954 kg) 242 lb (109.77 kg)    Intake/Output Summary (Last 24 hours) at 01/12/16 0724 Last data filed at 01/11/16 2049  Gross per 24 hour  Intake    840 ml  Output    600 ml  Net    240 ml    Telemetry: NSR rates 70-100 bpm  Exam:  General: No acute distress. Sleeping   HEENT: Conjunctiva and lids normal, oropharynx clear.  Lungs: Clear to auscultation, nonlabored.  Cardiac: No elevated JVP or bruits. RRR, no gallop or rub.   Abdomen: Normoactive bowel sounds, nontender, nondistended.  Extremities: 1+pitting edema bilaterally  distal pulses full. Cellulitis of left lower legs. Woody skin with erythema.   Neuropsychiatric: Difficult to arouse.    Lab Results:  Basic Metabolic Panel:  Recent Labs Lab 01/09/16 0522 01/10/16 0520 01/11/16 0507  NA 125* 124* 121*  K 4.7 4.8 4.4  CL 91* 89* 86*  CO2 26 26 28   GLUCOSE 120* 120* 145*  BUN 32* 29* 26*  CREATININE 1.34* 1.26* 1.16*  CALCIUM 8.4* 8.6* 8.9    CBC:  Recent Labs Lab 01/06/16 1900 01/08/16 0606  WBC 9.1 6.6  HGB 9.3* 8.6*  HCT 26.4* 24.9*  MCV 87.1 88.6  PLT 200 180    Cardiac Enzymes:  Recent Labs Lab 01/06/16 1900  TROPONINI <0.03   Echocardiogram 01/10/2016 Left ventricle: The cavity size was normal. Wall thickness was  increased in a pattern of mild LVH. Systolic function was  vigorous.  The estimated ejection fraction was in the range of 65%  to 70%. Wall motion was normal; there were no regional wall  motion abnormalities. Features are consistent with a pseudonormal  left ventricular filling pattern, with concomitant abnormal  relaxation and increased filling pressure (grade 2 diastolic  dysfunction). - Aortic valve: Mildly calcified annulus. Trileaflet. Mean gradient  (S): 9 mm Hg. Peak gradient (S): 21 mm Hg. - Mitral valve: There was trivial regurgitation. - Right atrium: Central venous pressure (est): 3 mm Hg. - Tricuspid valve: There was trivial regurgitation. - Pulmonary arteries: PA peak pressure: 32 mm Hg (S). - Pericardium, extracardiac: A prominent pericardial fat pad was  present.  ECG:   Medications:   Scheduled Medications: . albuterol  2.5 mg Nebulization TID  . apixaban  5 mg Oral BID  . diltiazem  120 mg Oral Daily  . fluticasone furoate-vilanterol  1 puff Inhalation Daily  . furosemide  40 mg Intravenous Q12H  . gabapentin  600 mg Oral QHS  . levothyroxine  137 mcg Oral QAC breakfast  . loratadine  10 mg Oral Daily  . losartan  100 mg Oral Daily  . pantoprazole  40 mg Oral Daily  . sodium chloride flush  3 mL Intravenous Q12H  . vancomycin  750 mg Intravenous Q24H    Infusions:    PRN Medications: acetaminophen **  OR** acetaminophen, cyclobenzaprine, HYDROcodone-acetaminophen, ondansetron **OR** ondansetron (ZOFRAN) IV   Assessment and Plan:   1.Acute on Chronic Diastolic CHF: She has diuresed 6 L since admission, with mild bilateral 1+ pitting edema. Sodium 121, lower than initial admission at 125. Creatinine 1.16. Echocardiogram reveals normal systolic function with diastolic dysfunction. Weight does not reflect diuresis. Currently 242 pounds, with a weight of 225 pounds when seen in our office in February 2017. We'll ask the nurses to weigh her on a standing scale. She continues on Lasix 40 mg every 12 hours. (It is noted that  she has been dictated units on her bedside table have been removed).   2. Paroxysmal atrial fibrillation: Remains in normal sinus rhythm heart rate averaging 70-80 bpm, but some episodes greater than 100 bpm per telemetry. She is on diltiazem 120 mg only for heart rate control. She continues on Eliquis 5 mg twice a day, CHADS VASC Score of 3.  3. Hypertension: Blood pressure currently controlled. Will not make any changes in her medication regimen. She remains on diltiazem and losartan.  4. Cellulitis: Antibiotics per Dr. Juanetta Gosling.  Bettey Mare. Lawrence NP [facility]  01/12/2016, 7:24 AM   Patient seen and discussed with NP Lyman Bishop, I agree with her documentation above. Admitted with acute on chronic diastolic HF. Echo 12/2015 with LVEF 65-70%, grade II diastolic dysfunction. +283mL yesterday, negative 6.2 liters since admission. He is on lasix  IV bid, yesterdays evening dose was held due to low bp. Initial downtrend in Cr with diuresis now trending up, worsening hyponatremia. Unclear how much of her remaining LE edema may be chronic, her previous notes from Dr Purvis Sheffield due mention a component of chronic LE edema. We will hold lasix after AM dose of  and reevaluate volume status and labs tomorrow before further dosing. Decrease losartan to  daily in setting of some low bp's.   Yolanda Ferry MD

## 2016-01-12 NOTE — Progress Notes (Signed)
Subjective: She says she feels better. She had some anxiety last night. She is not on anything for anxiety so I will will order that. She feels like her legs are better. She is down about 6 L.  Objective: Vital signs in last 24 hours: Temp:  [97.8 F (36.6 C)-98.8 F (37.1 C)] 98.8 F (37.1 C) (04/20 0545) Pulse Rate:  [71-95] 95 (04/20 0545) Resp:  [20] 20 (04/20 0545) BP: (92-141)/(40-77) 104/54 mmHg (04/20 0545) SpO2:  [91 %-96 %] 96 % (04/20 0844) Weight:  [109.09 kg (240 lb 8 oz)] 109.09 kg (240 lb 8 oz) (04/20 0813) Weight change:  Last BM Date: 01/11/16  Intake/Output from previous day: 04/19 0701 - 04/20 0700 In: 840 [P.O.:840] Out: 600 [Urine:600]  PHYSICAL EXAM General appearance: alert, cooperative, mild distress and morbidly obese Resp: clear to auscultation bilaterally Cardio: regular rate and rhythm, S1, S2 normal, no murmur, click, rub or gallop GI: soft, non-tender; bowel sounds normal; no masses,  no organomegaly Extremities: 1+ edema still with erythema or left leg greater than right  Lab Results:  Results for orders placed or performed during the hospital encounter of 01/06/16 (from the past 48 hour(s))  Folate     Status: None   Collection Time: 01/10/16  9:32 AM  Result Value Ref Range   Folate 16.1 >5.9 ng/mL    Comment: Performed at [facility]  Iron and TIBC     Status: None   Collection Time: 01/10/16  9:32 AM  Result Value Ref Range   Iron 54 28 - 170 ug/dL   TIBC 093 235 - 573 ug/dL   Saturation Ratios 12 10.4 - 31.8 %   UIBC 381 ug/dL    Comment: Performed at [facility]  Ferritin     Status: None   Collection Time: 01/10/16  9:32 AM  Result Value Ref Range   Ferritin 64 11 - 307 ng/mL    Comment: Performed at [facility]  Reticulocytes     Status: Abnormal   Collection Time: 01/10/16  9:32 AM  Result Value Ref Range   Retic Ct Pct 4.9 (H) 0.4 - 3.1 %   RBC. 2.95 (L) 3.87 - 5.11 MIL/uL   Retic Count, Manual  144.6 19.0 - 186.0 K/uL  Occult blood card to lab, stool RN will collect     Status: None   Collection Time: 01/10/16  9:39 AM  Result Value Ref Range   Fecal Occult Bld NEGATIVE NEGATIVE  Vancomycin, trough     Status: Abnormal   Collection Time: 01/10/16  8:30 PM  Result Value Ref Range   Vancomycin Tr 22 (H) 10.0 - 20.0 ug/mL  Basic metabolic panel     Status: Abnormal   Collection Time: 01/11/16  5:07 AM  Result Value Ref Range   Sodium 121 (L) 135 - 145 mmol/L   Potassium 4.4 3.5 - 5.1 mmol/L   Chloride 86 (L) 101 - 111 mmol/L   CO2 28 22 - 32 mmol/L   Glucose, Bld 145 (H) 65 - 99 mg/dL   BUN 26 (H) 6 - 20 mg/dL   Creatinine, Ser 2.20 (H) 0.44 - 1.00 mg/dL   Calcium 8.9 8.9 - 25.4 mg/dL   GFR calc non Af Amer 46 (L) >60 mL/min   GFR calc Af Amer 53 (L) >60 mL/min    Comment: (NOTE) The eGFR has been calculated using the CKD EPI equation. This calculation has not been validated in all clinical situations. eGFR's  persistently <60 mL/min signify possible Chronic Kidney Disease.    Anion gap 7 5 - 15  Basic metabolic panel     Status: Abnormal   Collection Time: 01/12/16  5:58 AM  Result Value Ref Range   Sodium 120 (L) 135 - 145 mmol/L   Potassium 4.6 3.5 - 5.1 mmol/L   Chloride 82 (L) 101 - 111 mmol/L   CO2 28 22 - 32 mmol/L   Glucose, Bld 112 (H) 65 - 99 mg/dL   BUN 24 (H) 6 - 20 mg/dL   Creatinine, Ser 1.35 (H) 0.44 - 1.00 mg/dL   Calcium 9.0 8.9 - 10.3 mg/dL   GFR calc non Af Amer 38 (L) >60 mL/min   GFR calc Af Amer 44 (L) >60 mL/min    Comment: (NOTE) The eGFR has been calculated using the CKD EPI equation. This calculation has not been validated in all clinical situations. eGFR's persistently <60 mL/min signify possible Chronic Kidney Disease.    Anion gap 10 5 - 15    ABGS No results for input(s): PHART, PO2ART, TCO2, HCO3 in the last 72 hours.  Invalid input(s): PCO2 CULTURES Recent Results (from the past 240 hour(s))  Urine culture     Status:  Abnormal   Collection Time: 01/06/16  8:17 PM  Result Value Ref Range Status   Specimen Description URINE, CLEAN CATCH  Final   Special Requests Immunocompromised  Final   Culture >=100,000 COLONIES/mL ESCHERICHIA COLI (A)  Final   Report Status 01/09/2016 FINAL  Final   Organism ID, Bacteria ESCHERICHIA COLI (A)  Final      Susceptibility   Escherichia coli - MIC*    AMPICILLIN 8 SENSITIVE Sensitive     CEFAZOLIN <=4 SENSITIVE Sensitive     CEFTRIAXONE <=1 SENSITIVE Sensitive     CIPROFLOXACIN <=0.25 SENSITIVE Sensitive     GENTAMICIN <=1 SENSITIVE Sensitive     IMIPENEM <=0.25 SENSITIVE Sensitive     NITROFURANTOIN <=16 SENSITIVE Sensitive     TRIMETH/SULFA <=20 SENSITIVE Sensitive     AMPICILLIN/SULBACTAM 4 SENSITIVE Sensitive     PIP/TAZO <=4 SENSITIVE Sensitive     * >=100,000 COLONIES/mL ESCHERICHIA COLI   Studies/Results: No results found.  Medications:  Prior to Admission:  Prescriptions prior to admission  Medication Sig Dispense Refill Last Dose  . acetaminophen (TYLENOL) 500 MG tablet Take 500-1,000 mg by mouth as needed for mild pain.    Past Week at Unknown time  . albuterol (PROVENTIL HFA;VENTOLIN HFA) 108 (90 Base) MCG/ACT inhaler Inhale 2 puffs into the lungs every 6 (six) hours as needed for wheezing or shortness of breath.   01/06/2016 at Unknown time  . apixaban (ELIQUIS) 5 MG TABS tablet Take 1 tablet (5 mg total) by mouth 2 (two) times daily. 60 tablet 5 01/06/2016 at 800  . cetirizine (ZYRTEC) 10 MG tablet Take 10 mg by mouth daily.   01/06/2016 at Unknown time  . clindamycin (CLEOCIN) 150 MG capsule Take 150 mg by mouth 3 (three) times daily.   01/06/2016 at Unknown time  . diltiazem (CARDIZEM CD) 120 MG 24 hr capsule Take 1 capsule (120 mg total) by mouth daily. 90 capsule 3 01/06/2016 at Unknown time  . famotidine (PEPCID) 20 MG tablet One at bedtime (Patient taking differently: Take 20 mg by mouth at bedtime. ) 30 tablet 2 01/05/2016 at Unknown time  .  febuxostat (ULORIC) 40 MG tablet Take 40 mg by mouth daily.   Past Month at Unknown time  .  flintstones complete (FLINTSTONES) 60 MG chewable tablet Chew 3 tablets by mouth daily.   Past Week at Unknown time  . Fluticasone Furoate-Vilanterol 200-25 MCG/INH AEPB Inhale 1 puff into the lungs daily.   01/06/2016 at Unknown time  . gabapentin (NEURONTIN) 300 MG capsule Take 300 mg by mouth at bedtime. 1-2 tabs at bedtime restless leg syndrome   01/05/2016 at Unknown time  . levalbuterol (XOPENEX) 0.63 MG/3ML nebulizer solution Take 3 mLs (0.63 mg total) by nebulization every 6 (six) hours as needed for wheezing or shortness of breath. 90 mL 12 Past Week at Unknown time  . levothyroxine (SYNTHROID, LEVOTHROID) 137 MCG tablet Take 137 mcg by mouth daily before breakfast.   01/06/2016 at Unknown time  . losartan (COZAAR) 100 MG tablet Take 100 mg by mouth daily.   01/06/2016 at Unknown time  . magnesium 30 MG tablet Take 30 mg by mouth 2 (two) times daily.   01/06/2016  . metolazone (ZAROXOLYN) 5 MG tablet Take 5 mg by mouth 2 (two) times daily.   01/06/2016 at Unknown time  . omeprazole (PRILOSEC) 20 MG capsule Take 1 capsule (20 mg total) by mouth daily. 90 capsule 3 01/06/2016 at Unknown time  . potassium chloride SA (K-DUR,KLOR-CON) 20 MEQ tablet Take 1 tablet (20 mEq total) by mouth daily. (Patient taking differently: Take 20 mEq by mouth every other day. ) 30 tablet 6 01/05/2016 at Unknown time  . torsemide (DEMADEX) 20 MG tablet Take 60 mg by mouth 2 (two) times daily.   12 01/06/2016 at Unknown time  . triamcinolone (NASACORT ALLERGY 24HR) 55 MCG/ACT AERO nasal inhaler Place 2 sprays into the nose daily.   01/07/2016 at Unknown time  . VOLTAREN 1 % GEL Apply 1 application topically daily as needed.  0 Past Week at Unknown time   Scheduled: . albuterol  2.5 mg Nebulization TID  . apixaban  5 mg Oral BID  . diltiazem  120 mg Oral Daily  . fluticasone furoate-vilanterol  1 puff Inhalation Daily  . furosemide   40 mg Intravenous Q12H  . gabapentin  600 mg Oral QHS  . levothyroxine  137 mcg Oral QAC breakfast  . loratadine  10 mg Oral Daily  . losartan  100 mg Oral Daily  . pantoprazole  40 mg Oral Daily  . sodium chloride flush  3 mL Intravenous Q12H  . vancomycin  750 mg Intravenous Q24H   Continuous:  XNA:[id number] **OR** acetaminophen, ALPRAZolam, cyclobenzaprine, HYDROcodone-acetaminophen, ondansetron **OR** ondansetron (ZOFRAN) IV  Assesment: She has acute on chronic diastolic heart failure. She seems to be improving. She has cellulitis of both legs the left is worse. She had acute kidney injury and that is improving. She has chronic anticoagulation which is unchanged. Her sodium level has dropped in the hospital with diuresis Active Problems:   Chronic diastolic CHF (congestive heart failure) (HCC)   PAF (paroxysmal atrial fibrillation) (HCC)   Cellulitis   Hyponatremia   AKI (acute kidney injury) ([place])    Plan: Continue current treatments. I'm going to restrict her fluids. Continue IV antibiotics as she seems to have improved in the last 24 hours.    LOS: 6 days   Yolanda [name] L 01/12/2016, 8:48 AM

## 2016-01-13 DIAGNOSIS — I5033 Acute on chronic diastolic (congestive) heart failure: Secondary | ICD-10-CM | POA: Diagnosis present

## 2016-01-13 LAB — BASIC METABOLIC PANEL
ANION GAP: 8 (ref 5–15)
BUN: 24 mg/dL — ABNORMAL HIGH (ref 6–20)
CHLORIDE: 83 mmol/L — AB (ref 101–111)
CO2: 29 mmol/L (ref 22–32)
CREATININE: 1.31 mg/dL — AB (ref 0.44–1.00)
Calcium: 8.9 mg/dL (ref 8.9–10.3)
GFR calc non Af Amer: 40 mL/min — ABNORMAL LOW (ref >60)
GFR, EST AFRICAN AMERICAN: 46 mL/min — AB (ref >60)
Glucose, Bld: 114 mg/dL — ABNORMAL HIGH (ref 65–99)
Potassium: 4.7 mmol/L (ref 3.5–5.1)
SODIUM: 120 mmol/L — AB (ref 135–145)

## 2016-01-13 MED ORDER — TORSEMIDE 20 MG PO TABS: 20.0000 mg | ORAL_TABLET | Freq: Two times a day (BID) | ORAL | Status: DC

## 2016-01-13 NOTE — Care Management Note (Signed)
Case Management Note  Patient Details  Name: Ashley MurrainBrenda W Ulatowski MRN: [id number] Date of Birth: 08-16-1941   Expected Discharge Date:  01/09/16               Expected Discharge Plan:  Home w Home Health Services  In-House Referral:  NA  Discharge planning Services  CM Consult  Post Acute Care Choice:  Home Health Choice offered to:  Patient  DME Arranged:    DME Agency:     HH Arranged:  RN HH Agency:  [facility]  Status of Service:  Completed, signed off  Medicare Important Message Given:  Yes Date Medicare IM Given:    Medicare IM give by:    Date Additional Medicare IM Given:    Additional Medicare Important Message give by:     If discussed at Long Length of Stay Meetings, dates discussed:  4/20/217  Additional Comments: [facility] chosen [facility] from Agency list. Alroy BailiffLinda Lothian, of [facility], made aware of referral and will obtain pt info from chart. Pt is aware HH has 48 hours to initiate services at [place]. No further CM needs.   [name], Virgilia Quigg Demske, RN 01/13/2016, 10:32 AM

## 2016-01-13 NOTE — Progress Notes (Signed)
Patient states understanding of discharge instructions.  

## 2016-01-13 NOTE — Progress Notes (Addendum)
Patient ID: Yolanda [name], female   DOB: 02-04-1941, 75 y.o.   MRN: [id number]      Subjective:    SOB improved this AM  Objective:   Temp:  [98.1 F (36.7 C)-98.5 F (36.9 C)] 98.1 F (36.7 C) (04/21 0502) Pulse Rate:  [86-93] 86 (04/21 0502) Resp:  [16-20] 20 (04/21 0502) BP: (104-141)/(41-56) 104/45 mmHg (04/21 0502) SpO2:  [92 %-96 %] 92 % (04/21 0502) Last BM Date: 01/11/16  Filed Weights   01/09/16 0440 01/10/16 0848 01/12/16 0813  Weight: 240 lb 3.2 oz (108.954 kg) 242 lb (109.77 kg) 240 lb 8 oz (109.09 kg)    Intake/Output Summary (Last 24 hours) at 01/13/16 0832 Last data filed at 01/13/16 0300  Gross per 24 hour  Intake   1323 ml  Output   1150 ml  Net    173 ml    Telemetry: SR  Exam:  General: NAD  HEENT: sclera clear, throat clear  Resp: CTAB  Cardiac: RRR, no m/r/g, no jvd  GI: abdomen soft, NT, ND  MSK: 1-2+ bilateral LE edema  Neuro: no focal deficits  Psych: appropriate affect  Lab Results:  Basic Metabolic Panel:  Recent Labs Lab 01/11/16 0507 01/12/16 0558 01/13/16 0518  NA 121* 120* 120*  K 4.4 4.6 4.7  CL 86* 82* 83*  CO2 28 28 29   GLUCOSE 145* 112* 114*  BUN 26* 24* 24*  CREATININE 1.16* 1.35* 1.31*  CALCIUM 8.9 9.0 8.9    Liver Function Tests: No results for input(s): AST, ALT, ALKPHOS, BILITOT, PROT, ALBUMIN in the last 168 hours.  CBC:  Recent Labs Lab 01/06/16 1900 01/08/16 0606  WBC 9.1 6.6  HGB 9.3* 8.6*  HCT 26.4* 24.9*  MCV 87.1 88.6  PLT 200 180    Cardiac Enzymes:  Recent Labs Lab 01/06/16 1900  TROPONINI <0.03    BNP: No results for input(s): PROBNP in the last 8760 hours.  Coagulation: No results for input(s): INR in the last 168 hours.  ECG:   Medications:   Scheduled Medications: . albuterol  2.5 mg Nebulization TID  . apixaban  5 mg Oral BID  . diltiazem  120 mg Oral Daily  . fluticasone furoate-vilanterol  1 puff Inhalation Daily  . gabapentin  600 mg Oral QHS  .  levothyroxine  137 mcg Oral QAC breakfast  . loratadine  10 mg Oral Daily  . losartan  50 mg Oral Daily  . pantoprazole  40 mg Oral Daily  . sodium chloride flush  3 mL Intravenous Q12H  . vancomycin  750 mg Intravenous Q24H     Infusions:     PRN Medications:  acetaminophen **OR** acetaminophen, ALPRAZolam, cyclobenzaprine, HYDROcodone-acetaminophen, ondansetron **OR** ondansetron (ZOFRAN) IV     Assessment/Plan    1. Acute on chronic diastolic HF - Echo 12/2015 with LVEF 65-70%, grade II diastolic dysfunction - I/Os are incomplete from yesterday, she is negative approx 6 liters since admission. She received lasix 40mg  IV x 1 yesterday. Slight improvement in renal function, remains hyponatremic though Na stable today.  - previous notes from Dr Purvis SheffieldKoneswaran due mention a component of chronic LE edema. - would start torsemide 40mg  in AM and 20mg  in PM, with hyponatremia would not restart metolazone at this time. She will need a BMET in 1 week after discharge. I have spoken with her about not taking her metolazone, this can be reevaluated at her f/u with us  2. Hyponatremia - per primary team  3. HTN -  recent issues with low bp's, we decreased her losartan to  daily.  - continue to monitor  4. PAF - continue dilt and eliquis.    Discharge is reasonable today from cardiac standpoint, she will need close monitoring of her sodium. Home diuretics as recommended above, she will f/u with our clinic in 1 week with a BMET and Mg at that time.    Dina Rich, M.D.

## 2016-01-13 NOTE — Discharge Summary (Signed)
Physician Discharge Summary  Patient ID: Yolanda [name] MRN: [id number] DOB/AGE: Nov 08, 1940 75 y.o. Primary Care Physician:Kyland No L, MD Admit date: 01/06/2016 Discharge date: 01/13/2016    Discharge Diagnoses:   Active Problems:   Chronic diastolic CHF (congestive heart failure) (HCC)   PAF (paroxysmal atrial fibrillation) (HCC)   Cellulitis   Hyponatremia   AKI (acute kidney injury) (HCC)   Acute on chronic diastolic heart failure (HCC)     Medication List    TAKE these medications        acetaminophen 500 MG tablet  Commonly known as:  TYLENOL  Take 500-1,000 mg by mouth as needed for mild pain.     albuterol 108 (90 Base) MCG/ACT inhaler  Commonly known as:  PROVENTIL HFA;VENTOLIN HFA  Inhale 2 puffs into the lungs every 6 (six) hours as needed for wheezing or shortness of breath.     apixaban 5 MG Tabs tablet  Commonly known as:  ELIQUIS  Take 1 tablet (5 mg total) by mouth 2 (two) times daily.     cetirizine 10 MG tablet  Commonly known as:  ZYRTEC  Take 10 mg by mouth daily.     clindamycin 150 MG capsule  Commonly known as:  CLEOCIN  Take 150 mg by mouth 3 (three) times daily.     diltiazem 120 MG 24 hr capsule  Commonly known as:  CARDIZEM CD  Take 1 capsule (120 mg total) by mouth daily.     famotidine 20 MG tablet  Commonly known as:  PEPCID  One at bedtime     febuxostat 40 MG tablet  Commonly known as:  ULORIC  Take 40 mg by mouth daily.     flintstones complete 60 MG chewable tablet  Chew 3 tablets by mouth daily.     fluticasone furoate-vilanterol 200-25 MCG/INH Aepb  Commonly known as:  BREO ELLIPTA  Inhale 1 puff into the lungs daily.     gabapentin 300 MG capsule  Commonly known as:  NEURONTIN  Take 300 mg by mouth at bedtime. 1-2 tabs at bedtime restless leg syndrome     levalbuterol 0.63 MG/3ML nebulizer solution  Commonly known as:  XOPENEX  Take 3 mLs (0.63 mg total) by nebulization every 6 (six) hours as needed for  wheezing or shortness of breath.     levothyroxine 137 MCG tablet  Commonly known as:  SYNTHROID, LEVOTHROID  Take 137 mcg by mouth daily before breakfast.     losartan 100 MG tablet  Commonly known as:  COZAAR  Take 100 mg by mouth daily.     magnesium 30 MG tablet  Take 30 mg by mouth 2 (two) times daily.     metolazone 5 MG tablet  Commonly known as:  ZAROXOLYN  Take 5 mg by mouth 2 (two) times daily.     NASACORT ALLERGY 24HR 55 MCG/ACT Aero nasal inhaler  Generic drug:  triamcinolone  Place 2 sprays into the nose daily.     omeprazole 20 MG capsule  Commonly known as:  PRILOSEC  Take 1 capsule (20 mg total) by mouth daily.     potassium chloride SA 20 MEQ tablet  Commonly known as:  K-DUR,KLOR-CON  Take 1 tablet (20 mEq total) by mouth daily.     torsemide 20 MG tablet  Commonly known as:  DEMADEX  Take 1 tablet (20 mg total) by mouth 2 (two) times daily.     VOLTAREN 1 % Gel  Generic drug:  diclofenac sodium  Apply 1 application topically daily as needed.        Discharged Condition:Improved    Consults: Cardiology  Significant Diagnostic Studies: Dg Chest 2 View  01/06/2016  CLINICAL DATA:  Patient with lower extremity edema. Lower extremity cellulitis. EXAM: CHEST  2 VIEW COMPARISON:  Chest radiograph 11/06/2014; 07/01/2014. FINDINGS: Monitoring leads overlie the patient. Stable enlarged cardiac and mediastinal contours. No consolidative pulmonary opacities. No pleural effusion or pneumothorax. Thoracic spine degenerative changes. IMPRESSION: No active cardiopulmonary disease. Electronically Signed   By: Annia Beltrew  Davis M.D.   On: 01/06/2016 20:07    Lab Results: Basic Metabolic Panel:  Recent Labs  40/98/1104/20/17 0558 01/13/16 0518  NA 120* 120*  K 4.6 4.7  CL 82* 83*  CO2 28 29  GLUCOSE 112* 114*  BUN 24* 24*  CREATININE 1.35* 1.31*  CALCIUM 9.0 8.9   Liver Function Tests: No results for input(s): AST, ALT, ALKPHOS, BILITOT, PROT, ALBUMIN in the  last 72 hours.   CBC: No results for input(s): WBC, NEUTROABS, HGB, HCT, MCV, PLT in the last 72 hours.  Recent Results (from the past 240 hour(s))  Urine culture     Status: Abnormal   Collection Time: 01/06/16  8:17 PM  Result Value Ref Range Status   Specimen Description URINE, CLEAN CATCH  Final   Special Requests Immunocompromised  Final   Culture >=100,000 COLONIES/mL ESCHERICHIA COLI (A)  Final   Report Status 01/09/2016 FINAL  Final   Organism ID, Bacteria ESCHERICHIA COLI (A)  Final      Susceptibility   Escherichia coli - MIC*    AMPICILLIN 8 SENSITIVE Sensitive     CEFAZOLIN <=4 SENSITIVE Sensitive     CEFTRIAXONE <=1 SENSITIVE Sensitive     CIPROFLOXACIN <=0.25 SENSITIVE Sensitive     GENTAMICIN <=1 SENSITIVE Sensitive     IMIPENEM <=0.25 SENSITIVE Sensitive     NITROFURANTOIN <=16 SENSITIVE Sensitive     TRIMETH/SULFA <=20 SENSITIVE Sensitive     AMPICILLIN/SULBACTAM 4 SENSITIVE Sensitive     PIP/TAZO <=4 SENSITIVE Sensitive     * >=100,000 COLONIES/mL ESCHERICHIA COLI     Hospital Course: This is a 75 year old who I've been seen in my office with acute on chronic diastolic heart failure. She had developed some cellulitis of her left leg and had started treatment but only was treated for about 12 hours before admission. Her cellulitis got worse and she came to the emergency department and was admitted from there. She was noted to have hyponatremia. She was treated with IV antibiotics IV diuretics and improved. Her sodium level went down to 120 but she is totally asymptomatic. Her leg looks better and she looks better. She has much less edema. She is able to ambulate.  Discharge Exam: Blood pressure 104/45, pulse 86, temperature 98.1 F (36.7 C), temperature source Oral, resp. rate 20, height 5\' 1"  (1.549 m), weight 109.09 kg (240 lb 8 oz), SpO2 91 %. She is awake and alert. Her chest is clear. She has much less edema. Cellulitis changes have improved  markedly  Disposition: Home with home health services. She was told to restrict her water to less than 1 L per day. She will start back on her oral antibiotics and I cut back on her torsemide from 60 mg twice a day to 20 mg twice a day. She will have basic metabolic profile in 3 days.      Discharge Instructions    Discharge patient    Complete by:  As directed  Face-to-face encounter (required for Medicare/Medicaid patients)    Complete by:  As directed   I Lynnea Vandervoort L certify that this patient is under my care and that I, or a nurse practitioner or physician's assistant working with me, had a face-to-face encounter that meets the physician face-to-face encounter requirements with this patient on 01/13/2016. The encounter with the patient was in whole, or in part for the following medical condition(s) which is the primary reason for home health care (List medical condition): Cellulitis/congestive heart failure  The encounter with the patient was in whole, or in part, for the following medical condition, which is the primary reason for home health care:  Cellulitis/congestive heart failure  I certify that, based on my findings, the following services are medically necessary home health services:  Nursing  Reason for Medically Necessary Home Health Services:  Skilled Nursing- Change/Decline in Patient Status  My clinical findings support the need for the above services:  Shortness of breath with activity  Further, I certify that my clinical findings support that this patient is homebound due to:  Shortness of Breath with activity     Home Health    Complete by:  As directed   To provide the following care/treatments:   PT RN    Please do basic metabolic profile on 01/16/2016             Signed: Kari Baars L   01/13/2016, 8:47 AM

## 2016-01-13 NOTE — Care Management Important Message (Signed)
Important Message  Patient Details  Name: Yolanda [name] MRN: [id number] Date of Birth: 1941-04-20   Medicare Important Message Given:  Yes    Malcolm MetroChildress, Chong January Demske, RN 01/13/2016, 10:06 AM

## 2016-01-13 NOTE — Progress Notes (Signed)
Subjective: She feels better and wants to go home. She is much less short of breath.  Objective: Vital signs in last 24 hours: Temp:  [98.1 F (36.7 C)-98.5 F (36.9 C)] 98.1 F (36.7 C) (04/21 0502) Pulse Rate:  [86-93] 86 (04/21 0502) Resp:  [16-20] 20 (04/21 0502) BP: (104-141)/(41-56) 104/45 mmHg (04/21 0502) SpO2:  [92 %-96 %] 92 % (04/21 0502) Weight change:  Last BM Date: 01/11/16  Intake/Output from previous day: 04/20 0701 - 04/21 0700 In: 1323 [P.O.:1320; I.V.:3] Out: 1150 [Urine:1150]  PHYSICAL EXAM General appearance: alert, cooperative and no distress Resp: clear to auscultation bilaterally Cardio: regular rate and rhythm, S1, S2 normal, no murmur, click, rub or gallop GI: soft, non-tender; bowel sounds normal; no masses,  no organomegaly Extremities: The changes of cellulitis looked much better and her edema is essentially resolved  Lab Results:  Results for orders placed or performed during the hospital encounter of 01/06/16 (from the past 48 hour(s))  Basic metabolic panel     Status: Abnormal   Collection Time: 01/12/16  5:58 AM  Result Value Ref Range   Sodium 120 (L) 135 - 145 mmol/L   Potassium 4.6 3.5 - 5.1 mmol/L   Chloride 82 (L) 101 - 111 mmol/L   CO2 28 22 - 32 mmol/L   Glucose, Bld 112 (H) 65 - 99 mg/dL   BUN 24 (H) 6 - 20 mg/dL   Creatinine, Ser 1.35 (H) 0.44 - 1.00 mg/dL   Calcium 9.0 8.9 - 10.3 mg/dL   GFR calc non Af Amer 38 (L) >60 mL/min   GFR calc Af Amer 44 (L) >60 mL/min    Comment: (NOTE) The eGFR has been calculated using the CKD EPI equation. This calculation has not been validated in all clinical situations. eGFR's persistently <60 mL/min signify possible Chronic Kidney Disease.    Anion gap 10 5 - 15  Basic metabolic panel     Status: Abnormal   Collection Time: 01/13/16  5:18 AM  Result Value Ref Range   Sodium 120 (L) 135 - 145 mmol/L   Potassium 4.7 3.5 - 5.1 mmol/L   Chloride 83 (L) 101 - 111 mmol/L   CO2 29 22 - 32  mmol/L   Glucose, Bld 114 (H) 65 - 99 mg/dL   BUN 24 (H) 6 - 20 mg/dL   Creatinine, Ser 1.31 (H) 0.44 - 1.00 mg/dL   Calcium 8.9 8.9 - 10.3 mg/dL   GFR calc non Af Amer 40 (L) >60 mL/min   GFR calc Af Amer 46 (L) >60 mL/min    Comment: (NOTE) The eGFR has been calculated using the CKD EPI equation. This calculation has not been validated in all clinical situations. eGFR's persistently <60 mL/min signify possible Chronic Kidney Disease.    Anion gap 8 5 - 15    ABGS No results for input(s): PHART, PO2ART, TCO2, HCO3 in the last 72 hours.  Invalid input(s): PCO2 CULTURES Recent Results (from the past 240 hour(s))  Urine culture     Status: Abnormal   Collection Time: 01/06/16  8:17 PM  Result Value Ref Range Status   Specimen Description URINE, CLEAN CATCH  Final   Special Requests Immunocompromised  Final   Culture >=100,000 COLONIES/mL ESCHERICHIA COLI (A)  Final   Report Status 01/09/2016 FINAL  Final   Organism ID, Bacteria ESCHERICHIA COLI (A)  Final      Susceptibility   Escherichia coli - MIC*    AMPICILLIN 8 SENSITIVE Sensitive  CEFAZOLIN <=4 SENSITIVE Sensitive     CEFTRIAXONE <=1 SENSITIVE Sensitive     CIPROFLOXACIN <=0.25 SENSITIVE Sensitive     GENTAMICIN <=1 SENSITIVE Sensitive     IMIPENEM <=0.25 SENSITIVE Sensitive     NITROFURANTOIN <=16 SENSITIVE Sensitive     TRIMETH/SULFA <=20 SENSITIVE Sensitive     AMPICILLIN/SULBACTAM 4 SENSITIVE Sensitive     PIP/TAZO <=4 SENSITIVE Sensitive     * >=100,000 COLONIES/mL ESCHERICHIA COLI   Studies/Results: No results found.  Medications:  Prior to Admission:  Prescriptions prior to admission  Medication Sig Dispense Refill Last Dose  . acetaminophen (TYLENOL) 500 MG tablet Take 500-1,000 mg by mouth as needed for mild pain.    Past Week at Unknown time  . albuterol (PROVENTIL HFA;VENTOLIN HFA) 108 (90 Base) MCG/ACT inhaler Inhale 2 puffs into the lungs every 6 (six) hours as needed for wheezing or shortness  of breath.   01/06/2016 at Unknown time  . apixaban (ELIQUIS) 5 MG TABS tablet Take 1 tablet (5 mg total) by mouth 2 (two) times daily. 60 tablet 5 01/06/2016 at 800  . cetirizine (ZYRTEC) 10 MG tablet Take 10 mg by mouth daily.   01/06/2016 at Unknown time  . clindamycin (CLEOCIN) 150 MG capsule Take 150 mg by mouth 3 (three) times daily.   01/06/2016 at Unknown time  . diltiazem (CARDIZEM CD) 120 MG 24 hr capsule Take 1 capsule (120 mg total) by mouth daily. 90 capsule 3 01/06/2016 at Unknown time  . famotidine (PEPCID) 20 MG tablet One at bedtime (Patient taking differently: Take 20 mg by mouth at bedtime. ) 30 tablet 2 01/05/2016 at Unknown time  . febuxostat (ULORIC) 40 MG tablet Take 40 mg by mouth daily.   Past Month at Unknown time  . flintstones complete (FLINTSTONES) 60 MG chewable tablet Chew 3 tablets by mouth daily.   Past Week at Unknown time  . Fluticasone Furoate-Vilanterol 200-25 MCG/INH AEPB Inhale 1 puff into the lungs daily.   01/06/2016 at Unknown time  . gabapentin (NEURONTIN) 300 MG capsule Take 300 mg by mouth at bedtime. 1-2 tabs at bedtime restless leg syndrome   01/05/2016 at Unknown time  . levalbuterol (XOPENEX) 0.63 MG/3ML nebulizer solution Take 3 mLs (0.63 mg total) by nebulization every 6 (six) hours as needed for wheezing or shortness of breath. 90 mL 12 Past Week at Unknown time  . levothyroxine (SYNTHROID, LEVOTHROID) 137 MCG tablet Take 137 mcg by mouth daily before breakfast.   01/06/2016 at Unknown time  . losartan (COZAAR) 100 MG tablet Take 100 mg by mouth daily.   01/06/2016 at Unknown time  . magnesium 30 MG tablet Take 30 mg by mouth 2 (two) times daily.   01/06/2016  . metolazone (ZAROXOLYN) 5 MG tablet Take 5 mg by mouth 2 (two) times daily.   01/06/2016 at Unknown time  . omeprazole (PRILOSEC) 20 MG capsule Take 1 capsule (20 mg total) by mouth daily. 90 capsule 3 01/06/2016 at Unknown time  . potassium chloride SA (K-DUR,KLOR-CON) 20 MEQ tablet Take 1 tablet (20 mEq  total) by mouth daily. (Patient taking differently: Take 20 mEq by mouth every other day. ) 30 tablet 6 01/05/2016 at Unknown time  . torsemide (DEMADEX) 20 MG tablet Take 60 mg by mouth 2 (two) times daily.   12 01/06/2016 at Unknown time  . triamcinolone (NASACORT ALLERGY 24HR) 55 MCG/ACT AERO nasal inhaler Place 2 sprays into the nose daily.   01/07/2016 at Unknown time  . VOLTAREN  1 % GEL Apply 1 application topically daily as needed.  0 Past Week at Unknown time   Scheduled: . albuterol  2.5 mg Nebulization TID  . apixaban  5 mg Oral BID  . diltiazem  120 mg Oral Daily  . fluticasone furoate-vilanterol  1 puff Inhalation Daily  . gabapentin  600 mg Oral QHS  . levothyroxine  137 mcg Oral QAC breakfast  . loratadine  10 mg Oral Daily  . losartan  50 mg Oral Daily  . pantoprazole  40 mg Oral Daily  . sodium chloride flush  3 mL Intravenous Q12H  . vancomycin  750 mg Intravenous Q24H   Continuous:  FQM:[id number] **OR** acetaminophen, ALPRAZolam, cyclobenzaprine, HYDROcodone-acetaminophen, ondansetron **OR** ondansetron (ZOFRAN) IV  Assesment: She was admitted with acute on chronic diastolic heart failure and cellulitis of her legs. She has hyponatremia and her sodium now is 120 but she is totally asymptomatic from that. She had acute kidney injury which is much better. I think she is ready for discharge. Active Problems:   Chronic diastolic CHF (congestive heart failure) (HCC)   PAF (paroxysmal atrial fibrillation) (HCC)   Cellulitis   Hyponatremia   AKI (acute kidney injury) ([place])    Plan: Discharge home today    LOS: 7 days   Yolanda [name] L 01/13/2016, 8:41 AM

## 2016-01-15 ENCOUNTER — Encounter (HOSPITAL_COMMUNITY): Payer: Self-pay | Admitting: Emergency Medicine

## 2016-01-15 ENCOUNTER — Emergency Department (HOSPITAL_COMMUNITY)
Admission: EM | Admit: 2016-01-15 | Discharge: 2016-01-15 | Disposition: A | Discharge status: Home / Self Care | Payer: Medicare Other | Attending: Emergency Medicine | Admitting: Emergency Medicine

## 2016-01-15 ENCOUNTER — Emergency Department (HOSPITAL_COMMUNITY): Payer: Medicare Other

## 2016-01-15 DIAGNOSIS — E039 Hypothyroidism, unspecified: Secondary | ICD-10-CM | POA: Insufficient documentation

## 2016-01-15 DIAGNOSIS — R600 Localized edema: Secondary | ICD-10-CM | POA: Insufficient documentation

## 2016-01-15 DIAGNOSIS — M7989 Other specified soft tissue disorders: Secondary | ICD-10-CM | POA: Diagnosis present

## 2016-01-15 DIAGNOSIS — J449 Chronic obstructive pulmonary disease, unspecified: Secondary | ICD-10-CM | POA: Diagnosis not present

## 2016-01-15 DIAGNOSIS — I5030 Unspecified diastolic (congestive) heart failure: Secondary | ICD-10-CM | POA: Diagnosis not present

## 2016-01-15 DIAGNOSIS — R609 Edema, unspecified: Secondary | ICD-10-CM

## 2016-01-15 DIAGNOSIS — I110 Hypertensive heart disease with heart failure: Secondary | ICD-10-CM | POA: Diagnosis not present

## 2016-01-15 DIAGNOSIS — Z87891 Personal history of nicotine dependence: Secondary | ICD-10-CM | POA: Diagnosis not present

## 2016-01-15 DIAGNOSIS — Z79899 Other long term (current) drug therapy: Secondary | ICD-10-CM | POA: Diagnosis not present

## 2016-01-15 DIAGNOSIS — E871 Hypo-osmolality and hyponatremia: Secondary | ICD-10-CM

## 2016-01-15 HISTORY — DX: Cellulitis, unspecified: L03.90

## 2016-01-15 LAB — CBC
HEMATOCRIT: 26.8 % — AB (ref 36.0–46.0)
Hemoglobin: 9.3 g/dL — ABNORMAL LOW (ref 12.0–15.0)
MCH: 29.9 pg (ref 26.0–34.0)
MCHC: 34.7 g/dL (ref 30.0–36.0)
MCV: 86.2 fL (ref 78.0–100.0)
PLATELETS: 260 10*3/uL (ref 150–400)
RBC: 3.11 MIL/uL — ABNORMAL LOW (ref 3.87–5.11)
RDW: 15.4 % (ref 11.5–15.5)
WBC: 13.4 10*3/uL — AB (ref 4.0–10.5)

## 2016-01-15 LAB — BASIC METABOLIC PANEL
Anion gap: 10 (ref 5–15)
BUN: 24 mg/dL — AB (ref 6–20)
CALCIUM: 9.3 mg/dL (ref 8.9–10.3)
CO2: 29 mmol/L (ref 22–32)
CREATININE: 1.56 mg/dL — AB (ref 0.44–1.00)
Chloride: 83 mmol/L — ABNORMAL LOW (ref 101–111)
GFR calc Af Amer: 37 mL/min — ABNORMAL LOW (ref >60)
GFR, EST NON AFRICAN AMERICAN: 32 mL/min — AB (ref >60)
GLUCOSE: 142 mg/dL — AB (ref 65–99)
POTASSIUM: 4.8 mmol/L (ref 3.5–5.1)
SODIUM: 122 mmol/L — AB (ref 135–145)

## 2016-01-15 LAB — BRAIN NATRIURETIC PEPTIDE: B Natriuretic Peptide: 74 pg/mL (ref 0.0–100.0)

## 2016-01-15 LAB — PROTIME-INR
INR: 2.03 — AB (ref 0.00–1.49)
Prothrombin Time: 22.9 seconds — ABNORMAL HIGH (ref 11.6–15.2)

## 2016-01-15 LAB — TROPONIN I: Troponin I: <0.03 ng/mL (ref <0.031)

## 2016-01-15 LAB — I-STAT CG4 LACTIC ACID, ED: LACTIC ACID, VENOUS: 1.33 mmol/L (ref 0.5–2.0)

## 2016-01-15 NOTE — Discharge Instructions (Signed)
Take your prescriptions as previously directed.  Call your regular medical doctor tomorrow to schedule a follow up appointment within the next 3 days. Call your Cardiologist tomorrow to confirm your previously scheduled follow up appointment in 2 days.  Return to the Emergency Department immediately sooner if worsening.

## 2016-01-15 NOTE — ED Notes (Signed)
Tolerated ambulation well, pulse ox remained above 93

## 2016-01-15 NOTE — ED Provider Notes (Signed)
CSN: [id number]     Arrival date & time 01/15/16  1524 History   First MD Initiated Contact with Patient 01/15/16 1545     Chief Complaint  Patient presents with  . Leg Swelling      HPI Pt was seen at 1550. Per pt, c/o gradual onset and persistence of constant bilat LE's "swelling" and "redness" that began 2 days ago when she was discharged from the hospital. Pt states she was admitted for cellulitis of her bilat LE's and CHF. Pt states she has been taking her medications as prescribed. Pt states she has appt with Cards MD on Tuesday (in 2 days). Denies CP/palpitations, no SOB/cough, no abd pain, no N/V/D, no fevers, no focal motor weakness, no tingling/numbness in extremities.    Past Medical History  Diagnosis Date  . Hypothyroidism   . Essential hypertension   . GERD (gastroesophageal reflux disease)   . COPD (chronic obstructive pulmonary disease) (HCC)   . Bronchitis   . Barrett esophagus   . Diastolic heart failure (HCC)   . PAF (paroxysmal atrial fibrillation) (HCC)   . CHF (congestive heart failure) (HCC)   . Cellulitis    Past Surgical History  Procedure Laterality Date  . Cholecystectomy    . Colonoscopy    . Upper gastrointestinal endoscopy    . Breast biopsy Right Sept, 2012   Family History  Problem Relation Age of Onset  . Dementia Mother   . Lung cancer Father     smoked  . Hypertension Sister   . Diabetes Brother   . Obesity Sister   . Hypertension Sister   . Restless legs syndrome Sister   . Healthy Sister   . Lung cancer Maternal Uncle    Social History  Substance Use Topics  . Smoking status: Former Smoker -- 0.50 packs/day for 30 years    Types: Cigarettes    Start date: 09/24/1958    Quit date: 11/13/1999  . Smokeless tobacco: Never Used  . Alcohol Use: No    Review of Systems ROS: Statement: All systems negative except as marked or noted in the HPI; Constitutional: Negative for fever and chills. ; ; Eyes: Negative for eye pain, redness  and discharge. ; ; ENMT: Negative for ear pain, hoarseness, nasal congestion, sinus pressure and sore throat. ; ; Cardiovascular: Negative for chest pain, palpitations, diaphoresis, dyspnea and +peripheral edema. ; ; Respiratory: Negative for cough, wheezing and stridor. ; ; Gastrointestinal: Negative for nausea, vomiting, diarrhea, abdominal pain, blood in stool, hematemesis, jaundice and rectal bleeding. . ; ; Genitourinary: Negative for dysuria, flank pain and hematuria. ; ; Musculoskeletal: Negative for back pain and neck pain. Negative for swelling and trauma.; ; Skin: +rash. Negative for pruritus, abrasions, blisters, bruising and skin lesion.; ; Neuro: Negative for headache, lightheadedness and neck stiffness. Negative for weakness, altered level of consciousness , altered mental status, extremity weakness, paresthesias, involuntary movement, seizure and syncope.      Allergies  Ciprofloxacin; Doxycycline; Fish oil; Guaifenesin; Sulfamethoxazole w/trimethoprim 800-160; Adhesive; and Tramadol  Home Medications   Prior to Admission medications   Medication Sig Start Date End Date Taking? Authorizing Provider  acetaminophen (TYLENOL) 500 MG tablet Take 500-1,000 mg by mouth as needed for mild pain.     Historical Provider, MD  albuterol (PROVENTIL HFA;VENTOLIN HFA) 108 (90 Base) MCG/ACT inhaler Inhale 2 puffs into the lungs every 6 (six) hours as needed for wheezing or shortness of breath.    Historical Provider, MD  apixaban Everlene Balls)  5 MG TABS tablet Take 1 tablet (5 mg total) by mouth 2 (two) times daily. 11/13/14   Kari Baars, MD  cetirizine (ZYRTEC) 10 MG tablet Take 10 mg by mouth daily.    Historical Provider, MD  clindamycin (CLEOCIN) 150 MG capsule Take 150 mg by mouth 3 (three) times daily.    Historical Provider, MD  diltiazem (CARDIZEM CD) 120 MG 24 hr capsule Take 1 capsule (120 mg total) by mouth daily. 01/24/15   Laqueta Linden, MD  famotidine (PEPCID) 20 MG tablet One at  bedtime Patient taking differently: Take 20 mg by mouth at bedtime.  09/03/13   Nyoka Cowden, MD  febuxostat (ULORIC) 40 MG tablet Take 40 mg by mouth daily.    Historical Provider, MD  flintstones complete (FLINTSTONES) 60 MG chewable tablet Chew 3 tablets by mouth daily.    Historical Provider, MD  Fluticasone Furoate-Vilanterol 200-25 MCG/INH AEPB Inhale 1 puff into the lungs daily.    Historical Provider, MD  gabapentin (NEURONTIN) 300 MG capsule Take 300 mg by mouth at bedtime. 1-2 tabs at bedtime restless leg syndrome    Historical Provider, MD  levalbuterol (XOPENEX) 0.63 MG/3ML nebulizer solution Take 3 mLs (0.63 mg total) by nebulization every 6 (six) hours as needed for wheezing or shortness of breath. 11/13/14   Kari Baars, MD  levothyroxine (SYNTHROID, LEVOTHROID) 137 MCG tablet Take 137 mcg by mouth daily before breakfast.    Historical Provider, MD  losartan (COZAAR) 100 MG tablet Take 100 mg by mouth daily. 12/23/13   Historical Provider, MD  magnesium 30 MG tablet Take 30 mg by mouth 2 (two) times daily.    Historical Provider, MD  metolazone (ZAROXOLYN) 5 MG tablet Take 5 mg by mouth 2 (two) times daily.    Historical Provider, MD  omeprazole (PRILOSEC) 20 MG capsule Take 1 capsule (20 mg total) by mouth daily. 12/30/14   Len Blalock, NP  potassium chloride SA (K-DUR,KLOR-CON) 20 MEQ tablet Take 1 tablet (20 mEq total) by mouth daily. Patient taking differently: Take 20 mEq by mouth every other day.  02/18/15   Laqueta Linden, MD  torsemide (DEMADEX) 20 MG tablet Take 1 tablet (20 mg total) by mouth 2 (two) times daily. 01/13/16   Kari Baars, MD  triamcinolone (NASACORT ALLERGY 24HR) 55 MCG/ACT AERO nasal inhaler Place 2 sprays into the nose daily.    Historical Provider, MD  VOLTAREN 1 % GEL Apply 1 application topically daily as needed. 08/25/14   Historical Provider, MD   BP 110/69 mmHg  Pulse 75  Temp(Src) 98.3 F (36.8 C) (Oral)  Resp 20  Ht 5\' 2"  (1.575 m)  Wt  238 lb (107.956 kg)  BMI 43.52 kg/m2  SpO2 98% Physical Exam  1555: Physical examination:  Nursing notes reviewed; Vital signs and O2 SAT reviewed;  Constitutional: Well developed, Well nourished, Well hydrated, In no acute distress; Head:  Normocephalic, atraumatic; Eyes: EOMI, PERRL, No scleral icterus; ENMT: Mouth and pharynx normal, Mucous membranes moist; Neck: Supple, Full range of motion, No lymphadenopathy; Cardiovascular: Regular rate and rhythm, No gallop; Respiratory: Breath sounds clear & equal bilaterally, No wheezes.  Speaking full sentences with ease, Normal respiratory effort/excursion; Chest: Nontender, Movement normal; Abdomen: Soft, Nontender, Nondistended, Normal bowel sounds; Genitourinary: No CVA tenderness; Extremities: Pulses normal, No tenderness, +2 pedal edema bilat. +very mild erythema to bilat anterior tibial areas.; Neuro: AA&Ox3, Major CN grossly intact.  Speech clear. No gross focal motor or sensory deficits in extremities.;  Skin: Color normal, Warm, Dry.   ED Course  Procedures (including critical care time) Labs Review  Imaging Review  I have personally reviewed and evaluated these images and lab results as part of my medical decision-making.   EKG Interpretation   Date/Time:  00:  Pt's VS and labs all near baseline. No clear indication for admission or meds changes at this time. Dx and testing d/w pt and family.  Questions answered.  Verb understanding. T/C to [facility] Dr. Katrinka Blazing, case discussed, including:  HPI, pertinent PM/SHx, VS/PE, dx testing, ED course and treatment:  Agreeable to consult.   1830:  [facility] Dr. Katrinka Blazing has evaluated pt in the ED: agrees no clear indication to  admit pt or change meds at this time. Dx and testing d/w pt and  family.  Questions answered.  Verb understanding, agreeable to d/c home with outpt f/u with her Cards MD in 2 days as previously scheduled.      Samuel JesterKathleen Myron Stankovich, DO 01/18/16 1547

## 2016-01-15 NOTE — ED Notes (Signed)
Pt reports bilateral lower leg swelling and dyspnea with exertion. Pt recently d/c for cellulitis and exacerbation of CHF. Denies CP.

## 2016-01-17 ENCOUNTER — Ambulatory Visit (INDEPENDENT_AMBULATORY_CARE_PROVIDER_SITE_OTHER): Payer: Medicare Other | Admitting: Adult Health

## 2016-01-17 ENCOUNTER — Encounter: Payer: Self-pay | Admitting: Adult Health

## 2016-01-17 VITALS — BP 106/58 | HR 84 | Ht 61.0 in | Wt 236.0 lb

## 2016-01-17 DIAGNOSIS — I5032 Chronic diastolic (congestive) heart failure: Secondary | ICD-10-CM

## 2016-01-17 DIAGNOSIS — I10 Essential (primary) hypertension: Secondary | ICD-10-CM

## 2016-01-17 DIAGNOSIS — Z79899 Other long term (current) drug therapy: Secondary | ICD-10-CM | POA: Diagnosis not present

## 2016-01-17 DIAGNOSIS — I480 Paroxysmal atrial fibrillation: Secondary | ICD-10-CM

## 2016-01-17 DIAGNOSIS — D509 Iron deficiency anemia, unspecified: Secondary | ICD-10-CM

## 2016-01-17 LAB — CBC WITH DIFFERENTIAL/PLATELET
BASOS PCT: 0 %
Basophils Absolute: 0 cells/uL (ref 0–200)
Eosinophils Absolute: 224 cells/uL (ref 15–500)
Eosinophils Relative: 2 %
HEMATOCRIT: 26.9 % — AB (ref 35.0–45.0)
Hemoglobin: 9.1 g/dL — ABNORMAL LOW (ref 11.7–15.5)
LYMPHS ABS: 1680 {cells}/uL (ref 850–3900)
LYMPHS PCT: 15 %
MCH: 30 pg (ref 27.0–33.0)
MCHC: 33.8 g/dL (ref 32.0–36.0)
MCV: 88.8 fL (ref 80.0–100.0)
MONO ABS: 784 {cells}/uL (ref 200–950)
MPV: 8.3 fL (ref 7.5–12.5)
Monocytes Relative: 7 %
NEUTROS ABS: 8512 {cells}/uL — AB (ref 1500–7800)
Neutrophils Relative %: 76 %
Platelets: 281 10*3/uL (ref 140–400)
RBC: 3.03 MIL/uL — AB (ref 3.80–5.10)
RDW: 15.6 % — AB (ref 11.0–15.0)
WBC: 11.2 10*3/uL — AB (ref 3.8–10.8)

## 2016-01-17 MED ORDER — DILTIAZEM HCL ER 90 MG PO CP12: 90.0000 mg | ORAL_CAPSULE | Freq: Every day | ORAL | Status: DC

## 2016-01-17 MED ORDER — LOSARTAN POTASSIUM 50 MG PO TABS: 50.0000 mg | ORAL_TABLET | Freq: Every day | ORAL | Status: DC

## 2016-01-17 NOTE — Patient Instructions (Addendum)
Your physician recommends that you schedule a follow-up appointment in: 1 Week   Your physician has recommended you make the following change in your medication:   Decrease Diltiazem to 90 mg Daily  Decrease Losartan to 50 mg Daily   Your physician recommends that you have lab work done: (CBC, BMET)  If you need a refill on your cardiac medications before your next appointment, please call your pharmacy.  Thank you for choosing [facility]!

## 2016-01-17 NOTE — Progress Notes (Signed)
Cardiology Office Note   Date:  01/17/2016   ID:  Sri, Clegg 1941-09-05, MRN [id number]  PCP:  Fredirick Maudlin, MD  Cardiologist: Inis Sizer, NP   No chief complaint on file.     History of Present Illness: Yolanda [name] is a 75 y.o. female who presents for ongoing assessment and management of atrial fibrillation, chronic diastolic heart failure, with chronic lower extremity edema. and was felt to be related to venostasis and obesity.the patient was last in the office by Dr. Purvis Sheffield on 11/07/2015, she remained stable without any cardiac complaints. She is to followup in one year. She is normally followed in the [place] office.  She was recently admitted to the hospital in the setting of decompensated CHF, cellulitis, and COPD exacerbation. The patient has a history of paroxysmal atrial fibrillation as stated and remained on ELIQUIS. She was treated with IV antibiotics, taken off metolazone but she took twice a day, and continued on torsemide 40 mg daily. In divided doses. She comes today feeling worse. She states she can get her shoes on her legs are so swollen and sore. She is hypotensive. She states she has not urinating a lot on the doses of medication that she takes.  Past Medical History  Diagnosis Date  . Hypothyroidism   . Essential hypertension   . GERD (gastroesophageal reflux disease)   . COPD (chronic obstructive pulmonary disease) (HCC)   . Bronchitis   . Barrett esophagus   . Diastolic heart failure (HCC)   . PAF (paroxysmal atrial fibrillation) (HCC)   . CHF (congestive heart failure) (HCC)   . Cellulitis     Past Surgical History  Procedure Laterality Date  . Cholecystectomy    . Colonoscopy    . Upper gastrointestinal endoscopy    . Breast biopsy Right Sept, 2012     Current Outpatient Prescriptions  Medication Sig Dispense Refill  . acetaminophen (TYLENOL) 500 MG tablet Take 500-1,000 mg by mouth as needed for mild pain.     Marland Kitchen  albuterol (PROVENTIL HFA;VENTOLIN HFA) 108 (90 Base) MCG/ACT inhaler Inhale 2 puffs into the lungs every 6 (six) hours as needed for wheezing or shortness of breath.    Marland Kitchen apixaban (ELIQUIS) 5 MG TABS tablet Take 1 tablet (5 mg total) by mouth 2 (two) times daily. 60 tablet 5  . cetirizine (ZYRTEC) 10 MG tablet Take 10 mg by mouth daily.    . clindamycin (CLEOCIN) 150 MG capsule Take 150 mg by mouth 3 (three) times daily.    Marland Kitchen diltiazem (CARDIZEM CD) 120 MG 24 hr capsule Take 1 capsule (120 mg total) by mouth daily. 90 capsule 3  . famotidine (PEPCID) 20 MG tablet One at bedtime (Patient taking differently: Take 20 mg by mouth at bedtime. ) 30 tablet 2  . febuxostat (ULORIC) 40 MG tablet Take 40 mg by mouth daily.    . flintstones complete (FLINTSTONES) 60 MG chewable tablet Chew 3 tablets by mouth daily.    . Fluticasone Furoate-Vilanterol 200-25 MCG/INH AEPB Inhale 1 puff into the lungs daily.    Marland Kitchen gabapentin (NEURONTIN) 300 MG capsule Take 300-600 mg by mouth at bedtime. restless leg syndrome    . levalbuterol (XOPENEX) 0.63 MG/3ML nebulizer solution Take 3 mLs (0.63 mg total) by nebulization every 6 (six) hours as needed for wheezing or shortness of breath. 90 mL 12  . levothyroxine (SYNTHROID, LEVOTHROID) 137 MCG tablet Take 137 mcg by mouth daily before breakfast.    .  losartan (COZAAR) 100 MG tablet Take 100 mg by mouth daily.    . Magnesium 250 MG TABS Take 1 tablet by mouth 2 (two) times daily.    . metolazone (ZAROXOLYN) 5 MG tablet Take 5 mg by mouth 2 (two) times daily.    Marland Kitchen. omeprazole (PRILOSEC) 20 MG capsule Take 1 capsule (20 mg total) by mouth daily. 90 capsule 3  . potassium chloride SA (K-DUR,KLOR-CON) 20 MEQ tablet Take 1 tablet (20 mEq total) by mouth daily. (Patient taking differently: Take 20 mEq by mouth every other day. ) 30 tablet 6  . torsemide (DEMADEX) 20 MG tablet Take 1 tablet (20 mg total) by mouth 2 (two) times daily. 60 tablet 12  . triamcinolone (NASACORT ALLERGY  24HR) 55 MCG/ACT AERO nasal inhaler Place 2 sprays into the nose daily.    . VOLTAREN 1 % GEL Apply 1 application topically daily as needed (for pain).   0   No current facility-administered medications for this visit.    Allergies:   Ciprofloxacin; Doxycycline; Fish oil; Guaifenesin; Sulfamethoxazole w/trimethoprim 800-160; Adhesive; and Tramadol    Social History:  The patient  reports that she quit smoking about 16 years ago. Her smoking use included Cigarettes. She started smoking about 57 years ago. She has a 15 pack-year smoking history. She has never used smokeless tobacco. She reports that she does not drink alcohol or use illicit drugs.   Family History:  The patient's family history includes Dementia in her mother; Diabetes in her brother; Healthy in her sister; Hypertension in her sister and sister; Lung cancer in her father and maternal uncle; Obesity in her sister; Restless legs syndrome in her sister.    ROS: All other systems are reviewed and negative. Unless otherwise mentioned in H&P    PHYSICAL EXAM: VS:  BP 106/58 mmHg  Pulse 84  Ht 5\' 1"  (1.549 m)  Wt 236 lb (107.049 kg)  BMI 44.61 kg/m2  SpO2 94% , BMI Body mass index is 44.61 kg/(m^2). GEN: Well nourished, well developed, in no acute distressobese with moon face. HEENT: normal Neck: no JVD, carotid bruits, or masses Cardiac: RRR; no murmurs, rubs, or gallops, Respiratory:  Clear to auscultation bilaterally, normal work of breathing GI: soft, nontender, nondistended, + BS MS: no deformity or atrophy 2+ pitting edema to the knees, erythema, with pain on palpation.. Skin: warm and dry, no rash Neuro:  Strength and sensation are intact Psych: euthymic mood, full affect   Recent Labs: 06/10/2015: ALT 35* 01/06/2016: TSH 1.646 01/15/2016: B Natriuretic Peptide 74.0; BUN 24*; Creatinine, Ser 1.56*; Hemoglobin 9.3*; Platelets 260; Potassium 4.8; Sodium 122*    Lipid Panel No results found for: CHOL, TRIG, HDL,  CHOLHDL, VLDL, LDLCALC, LDLDIRECT    Wt Readings from Last 3 Encounters:  01/17/16 236 lb (107.049 kg)  01/15/16 238 lb (107.956 kg)  01/12/16 240 lb 8 oz (109.09 kg)      Other studies Reviewed: Additional studies/ records that were reviewed today include: echocardiogram Review of the above records demonstrates: LV size with normal wall thickness was increased in a pattern mild LVH, systolic function revealed EF of 65% to 70%, features consistent with supernormal left ventricular filling pattern with concomitant abnormal relaxation and increased filling pressure grade 2 diastolic dysfunction.   ASSESSMENT AND PLAN:  1 Chronic.lower extremity edema: patient has cellulitis with significant edema in the lower extremities. She is unable to put shoes on due to  the worsening fluid retention.she was taken off metolazone. She is  on diltiazem for paroxysmal atrial fibrillation which can also cause the lower extremity edema. I'm going to decrease it to 90 mg a day from 120 mg a day and she is hypotensive blood pressure 106/58 in the office today. She is not wearing TED hose and she still being treated for the cellulitis. Would recommend this once she has recovered.  2. Anemia:the patient was anemic while hospitalized, had been on iron replacement therapy but was no longer taking it. States she just got out of the habit. We'll check a CBC to evaluate for this and its contribution to her edema and hypotension.  3. Hypertension.She is hypotensive today. We'll decrease diltiazem olmesartan. We'll have her followup in the office in one week. She has a blood pressure cuff at home. She is to take her blood pressure daily. She is to call us 4 significantly elevated blood pressure. If blood pressure improves she may be able take a half a dose of metolazone to assist with the edema if it is not improved with medication changes. However I will not make that decision until we followup orally here from her by  phone.  4. Paroxysmal atrial fibrillation:discriminates and ELIQUIS 5 mg twice a day. No evidence of bleeding currently. CBC is planned.   Current medicines are reviewed at length with the patient today.    Labs/ tests ordered today include: CBC and BMET No orders of the defined types were placed in this encounter.     Disposition:   FU with 1 week. Signed, Joni Reining, NP  01/17/2016 2:58 PM    [facility] [address] [place], [place] [address] Phone: [phone]; Fax: [phone]

## 2016-01-17 NOTE — Progress Notes (Signed)
Name: Yolanda [name]    DOB: 19-Feb-1941  Age: 75 y.o.  MR#: [id number]       PCP:  Fredirick Maudlin, MD      Insurance: Payor: BLUE [employer] MEDICARE / Plan: BCBS MEDICARE / Product Type: *No Product type* /   CC:   No chief complaint on file.   VS Filed Vitals:   01/17/16 1451  BP: 106/58  Pulse: 84  Height:  (1.549 m)  Weight: 236 lb (107.049 kg)  SpO2: 94%    Weights Current Weight  01/17/16 236 lb (107.049 kg)  01/15/16 238 lb (107.956 kg)  01/12/16 240 lb 8 oz (109.09 kg)    Blood Pressure  BP Readings from Last 3 Encounters:  01/17/16 106/58  01/15/16 115/72  01/13/16 104/45     Admit date:  (Not on file) Last encounter with RMR:  Visit date not found   Allergy Ciprofloxacin; Doxycycline; Fish oil; Guaifenesin; Sulfamethoxazole w/trimethoprim 800-160; Adhesive; and Tramadol  Current Outpatient Prescriptions  Medication Sig Dispense Refill  . acetaminophen (TYLENOL) 500 MG tablet Take 500-1,000 mg by mouth as needed for mild pain.     Marland Kitchen albuterol (PROVENTIL HFA;VENTOLIN HFA) 108 (90 Base) MCG/ACT inhaler Inhale 2 puffs into the lungs every 6 (six) hours as needed for wheezing or shortness of breath.    Marland Kitchen apixaban (ELIQUIS) 5 MG TABS tablet Take 1 tablet (5 mg total) by mouth 2 (two) times daily. 60 tablet 5  . cetirizine (ZYRTEC) 10 MG tablet Take 10 mg by mouth daily.    . clindamycin (CLEOCIN) 150 MG capsule Take 150 mg by mouth 3 (three) times daily.    Marland Kitchen diltiazem (CARDIZEM CD) 120 MG 24 hr capsule Take 1 capsule (120 mg total) by mouth daily. 90 capsule 3  . famotidine (PEPCID) 20 MG tablet One at bedtime (Patient taking differently: Take 20 mg by mouth at bedtime. ) 30 tablet 2  . febuxostat (ULORIC) 40 MG tablet Take 40 mg by mouth daily.    . flintstones complete (FLINTSTONES) 60 MG chewable tablet Chew 3 tablets by mouth daily.    . Fluticasone Furoate-Vilanterol 200-25 MCG/INH AEPB Inhale 1 puff into the lungs daily.    Marland Kitchen gabapentin  (NEURONTIN) 300 MG capsule Take 300-600 mg by mouth at bedtime. restless leg syndrome    . levalbuterol (XOPENEX) 0.63 MG/3ML nebulizer solution Take 3 mLs (0.63 mg total) by nebulization every 6 (six) hours as needed for wheezing or shortness of breath. 90 mL 12  . levothyroxine (SYNTHROID, LEVOTHROID) 137 MCG tablet Take 137 mcg by mouth daily before breakfast.    . losartan (COZAAR) 100 MG tablet Take 100 mg by mouth daily.    . Magnesium 250 MG TABS Take 1 tablet by mouth 2 (two) times daily.    . metolazone (ZAROXOLYN) 5 MG tablet Take 5 mg by mouth 2 (two) times daily.    Marland Kitchen omeprazole (PRILOSEC) 20 MG capsule Take 1 capsule (20 mg total) by mouth daily. 90 capsule 3  . potassium chloride SA (K-DUR,KLOR-CON) 20 MEQ tablet Take 1 tablet (20 mEq total) by mouth daily. (Patient taking differently: Take 20 mEq by mouth every other day. ) 30 tablet 6  . torsemide (DEMADEX) 20 MG tablet Take 1 tablet (20 mg total) by mouth 2 (two) times daily. 60 tablet 12  . triamcinolone (NASACORT ALLERGY 24HR) 55 MCG/ACT AERO nasal inhaler Place 2 sprays into the nose daily.    . VOLTAREN 1 % GEL Apply  1 application topically daily as needed (for pain).   0   No current facility-administered medications for this visit.    Discontinued Meds:   There are no discontinued medications.  Patient Active Problem List   Diagnosis Date Noted  . Acute on chronic diastolic heart failure (HCC) 01/13/2016  . Hyponatremia 01/06/2016  . AKI (acute kidney injury) (HCC) 01/06/2016  . Cellulitis 12/15/2014  . PAF (paroxysmal atrial fibrillation) (HCC) 11/09/2014  . COPD exacerbation (HCC) 11/06/2014  . Neuropathic pain 11/06/2014  . Hypothyroidism 11/06/2014  . Chronic diastolic CHF (congestive heart failure) (HCC) 11/06/2014  . Hyperkalemia 11/06/2014  . Pedal edema 01/12/2014  . HTN (hypertension) 01/12/2014  . Upper airway cough syndrome 09/03/2013  . Barrett's esophagus 01/15/2013  . GERD (gastroesophageal  reflux disease) 11/13/2011  . Short-segment Barrett's esophagus 11/13/2011  . COPD  II  01/08/2008    LABS    Component Value Date/Time   NA 122* 01/15/2016 1548   NA 120* 01/13/2016 0518   NA 120* 01/12/2016 0558   K 4.8 01/15/2016 1548   K 4.7 01/13/2016 0518   K 4.6 01/12/2016 0558   CL 83* 01/15/2016 1548   CL 83* 01/13/2016 0518   CL 82* 01/12/2016 0558   CO2 29 01/15/2016 1548   CO2 29 01/13/2016 0518   CO2 28 01/12/2016 0558   GLUCOSE 142* 01/15/2016 1548   GLUCOSE 114* 01/13/2016 0518   GLUCOSE 112* 01/12/2016 0558   BUN 24* 01/15/2016 1548   BUN 24* 01/13/2016 0518   BUN 24* 01/12/2016 0558   CREATININE 1.56* 01/15/2016 1548   CREATININE 1.31* 01/13/2016 0518   CREATININE 1.35* 01/12/2016 0558   CREATININE 1.55* 01/27/2015 1117   CREATININE 1.23* 12/07/2014 1414   CREATININE 1.09 05/24/2014 1605   CALCIUM 9.3 01/15/2016 1548   CALCIUM 8.9 01/13/2016 0518   CALCIUM 9.0 01/12/2016 0558   GFRNONAA 32* 01/15/2016 1548   GFRNONAA 40* 01/13/2016 0518   GFRNONAA 38* 01/12/2016 0558   GFRAA 37* 01/15/2016 1548   GFRAA 46* 01/13/2016 0518   GFRAA 44* 01/12/2016 0558   CMP     Component Value Date/Time   NA 122* 01/15/2016 1548   K 4.8 01/15/2016 1548   CL 83* 01/15/2016 1548   CO2 29 01/15/2016 1548   GLUCOSE 142* 01/15/2016 1548   BUN 24* 01/15/2016 1548   CREATININE 1.56* 01/15/2016 1548   CREATININE 1.55* 01/27/2015 1117   CALCIUM 9.3 01/15/2016 1548   PROT 5.9* 06/10/2015 1431   ALBUMIN 3.6 06/10/2015 1431   AST 33 06/10/2015 1431   ALT 35* 06/10/2015 1431   ALKPHOS 65 06/10/2015 1431   BILITOT 0.5 06/10/2015 1431   GFRNONAA 32* 01/15/2016 1548   GFRAA 37* 01/15/2016 1548       Component Value Date/Time   WBC 13.4* 01/15/2016 1548   WBC 6.6 01/08/2016 0606   WBC 9.1 01/06/2016 1900   HGB 9.3* 01/15/2016 1548   HGB 8.6* 01/08/2016 0606   HGB 9.3* 01/06/2016 1900   HCT 26.8* 01/15/2016 1548   HCT 24.9* 01/08/2016 0606   HCT 26.4* 01/06/2016  1900   MCV 86.2 01/15/2016 1548   MCV 88.6 01/08/2016 0606   MCV 87.1 01/06/2016 1900    Lipid Panel  No results found for: CHOL, TRIG, HDL, CHOLHDL, VLDL, LDLCALC, LDLDIRECT  ABG    Component Value Date/Time   PHART 7.306* 11/06/2014 2312   PCO2ART 36.8 11/06/2014 2312   PO2ART 91.8 11/06/2014 2312   HCO3 17.8* 11/06/2014 2312  TCO2 16.5 11/06/2014 2312   ACIDBASEDEF 7.3* 11/06/2014 2312   O2SAT 95.6 11/06/2014 2312     Lab Results  Component Value Date   TSH 1.646 01/06/2016   BNP (last 3 results)  Recent Labs  01/06/16 1900 01/15/16 1548  BNP 49.0 74.0    ProBNP (last 3 results) No results for input(s): PROBNP in the last 8760 hours.  Cardiac Panel (last 3 results)  Recent Labs  01/15/16 1548  TROPONINI <0.03    Iron/TIBC/Ferritin/ %Sat    Component Value Date/Time   IRON 54 01/10/2016 0932   TIBC 435 01/10/2016 0932   FERRITIN 64 01/10/2016 0932   IRONPCTSAT 12 01/10/2016 0932   IRONPCTSAT 28 07/18/2015 1454     EKG Orders placed or performed during the hospital encounter of 01/15/16  . ED EKG  . ED EKG  . EKG 12-Lead  . EKG 12-Lead  . EKG     Prior Assessment and Plan Problem List as of 01/17/2016      Cardiovascular and Mediastinum   HTN (hypertension)   Last Assessment & Plan 12/15/2014 Office Visit Written 12/15/2014  1:43 PM by Dyann Kief, PA-C    Blood pressure stable      Chronic diastolic CHF (congestive heart failure) [facility])   Last Assessment & Plan 12/15/2014 Office Visit Written 12/15/2014  1:42 PM by Dyann Kief, PA-C    Patient's heart failure is improved on current diuretic dose. She has lost 4 pounds. Continue current diuretics. Will have home health draw BMP Thursday or Friday. Long discussion about low salt diet. Follow-up with Dr. Kirtland Bouchard in one month.      PAF (paroxysmal atrial fibrillation) [facility])   Last Assessment & Plan 12/15/2014 Office Visit Written 12/15/2014  1:43 PM by Dyann Kief, PA-C    Patient's rate is  regular. I did not order an EKG today. She is not having palpitations.      Acute on chronic diastolic heart failure [facility])     Respiratory   COPD  II    Last Assessment & Plan 09/03/2013 Office Visit Written 09/03/2013  5:41 PM by Nyoka Cowden, MD    - trial of anoro 07/24/2013 > improved 09/03/2013  - 09/03/2013  Walked RA x 2laps @ 185 ft each stopped due to legs gave out, sats still 93% - PFT's 09/03/2013 FEV1  1.05 (53%) p B2 and while on Anoro with ratio FEV1/VC 62% and dlco 68 corrects to 87  Improved on anoro vs advair or symbicort and much less saba use now   rec continue anoro unless starting to have aecopd or upper airway cough worse in which case would need to see her back her to regroup      COPD exacerbation (HCC)     Digestive   GERD (gastroesophageal reflux disease)   Short-segment Barrett's esophagus   Barrett's esophagus     Endocrine   Hypothyroidism     Genitourinary   AKI (acute kidney injury) (HCC)     Other   Pedal edema   Upper airway cough syndrome   Last Assessment & Plan 09/03/2013 Office Visit Written 09/03/2013  5:43 PM by Nyoka Cowden, MD    Throat congestion with absence of excess mucus worse in am and worse on dpi c/w  Classic Upper airway cough syndrome, so named because it's frequently impossible to sort out how much is  CR/sinusitis with freq throat clearing (which can be related to primary GERD)   vs  causing  secondary (" extra esophageal")  GERD from wide swings in gastric pressure that occur with throat clearing, often  promoting self use of mint and menthol lozenges that reduce the lower esophageal sphincter tone and exacerbate the problem further in a cyclical fashion.   These are the same pts (now being labeled as having "irritable larynx syndrome" by some cough centers) who not infrequently have a history of having failed to tolerate ace inhibitors,  dry powder inhalers or biphosphonates or report having atypical reflux symptoms that  don't respond to standard doses of PPI , and are easily confused as having aecopd or asthma flares by even experienced allergists/ pulmonologists.  rec add pepcid 20 mg at bedtime and if not effective add 1st gen h1 also If not tolerating laba/lamb dpi there's a new laba/lama hfa coming out soon from [facility] she could try.  F/u is prn          Neuropathic pain   Hyperkalemia   Last Assessment & Plan 12/15/2014 Office Visit Written 12/15/2014  1:45 PM by Dyann KiefMichele M Lenze, PA-C    Potassium was held for 5 days. Home health to draw BMP tomorrow.      Cellulitis   Last Assessment & Plan 12/15/2014 Office Visit Written 12/15/2014  1:45 PM by Dyann KiefMichele M Lenze, PA-C    Patient had recent diagnosis of left lower leg cellulitis treated with antibiotics by Dr. Juanetta GoslingHawkins. Patient took 8 days of doxycycline that made her stomach hurt and was given sulfa which also causes GI upset. She took this for 3 days. I told patient to contact Dr. Juanetta GoslingHawkins and asked how long she needs to stay on antibiotic. Cellulitis seems to have resolved.      Hyponatremia       Imaging: Dg Chest 2 View  01/15/2016  CLINICAL DATA:  Shortness of breath, leg swelling, recent admission for CHF EXAM: CHEST  2 VIEW COMPARISON:  01/06/2016 FINDINGS: Lungs are clear. No frank interstitial edema. No pleural effusion or pneumothorax. The heart is normal in size. Degenerative changes of the visualized thoracolumbar spine. IMPRESSION: No evidence of acute cardiopulmonary disease. Electronically Signed   By: Charline BillsSriyesh  Krishnan M.D.   On: 01/15/2016 16:25   Dg Chest 2 View  01/06/2016  CLINICAL DATA:  Patient with lower extremity edema. Lower extremity cellulitis. EXAM: CHEST  2 VIEW COMPARISON:  Chest radiograph 11/06/2014; 07/01/2014. FINDINGS: Monitoring leads overlie the patient. Stable enlarged cardiac and mediastinal contours. No consolidative pulmonary opacities. No pleural effusion or pneumothorax. Thoracic spine degenerative changes.  IMPRESSION: No active cardiopulmonary disease. Electronically Signed   By: Annia Beltrew  Davis M.D.   On: 01/06/2016 20:07

## 2016-01-18 ENCOUNTER — Encounter (INDEPENDENT_AMBULATORY_CARE_PROVIDER_SITE_OTHER): Payer: Self-pay | Admitting: *Deleted

## 2016-01-18 ENCOUNTER — Other Ambulatory Visit (INDEPENDENT_AMBULATORY_CARE_PROVIDER_SITE_OTHER): Payer: Self-pay | Admitting: *Deleted

## 2016-01-18 ENCOUNTER — Telehealth: Payer: Self-pay | Admitting: Adult Health

## 2016-01-18 DIAGNOSIS — K2270 Barrett's esophagus without dysplasia: Secondary | ICD-10-CM

## 2016-01-18 LAB — BASIC METABOLIC PANEL
BUN: 23 mg/dL (ref 7–25)
CHLORIDE: 86 mmol/L — AB (ref 98–110)
CO2: 28 mmol/L (ref 20–31)
Calcium: 8.7 mg/dL (ref 8.6–10.4)
Creat: 1.83 mg/dL — ABNORMAL HIGH (ref 0.60–0.93)
Glucose, Bld: 110 mg/dL — ABNORMAL HIGH (ref 65–99)
POTASSIUM: 4.7 mmol/L (ref 3.5–5.3)
Sodium: 126 mmol/L — ABNORMAL LOW (ref 135–146)

## 2016-01-18 NOTE — Telephone Encounter (Signed)
Patient would like to speak with nurse regarding medication change at visit yesterday. / tg

## 2016-01-18 NOTE — Telephone Encounter (Signed)
Pt states she spoke with someone earlier.

## 2016-01-25 NOTE — Discharge Summary (Signed)
NAMHarlow Mares:  Felkins, Kenleigh                ACCOUNT NO.:  [id number]  MEDICAL RECORD NO.:  [id number]  LOCATION:  [facility]                         FACILITY:  [facility]  PHYSICIAN:  Wolfe Camarena L. Juanetta GoslingHawkins, M.D.DATE OF BIRTH:  1941/05/02  DATE OF ADMISSION:  01/15/2016 DATE OF DISCHARGE:  04/23/2017LH                              DISCHARGE SUMMARY   ADDENDUM:  FINAL DISCHARGE DIAGNOSES:  Chronic kidney disease, stage III.     Sofia Jaquith L. Juanetta GoslingHawkins, M.D.     ELH/MEDQ  D:  01/25/2016  T:  01/25/2016  Job:  [id number]

## 2016-01-26 ENCOUNTER — Ambulatory Visit (INDEPENDENT_AMBULATORY_CARE_PROVIDER_SITE_OTHER): Payer: Medicare Other | Admitting: Cardiovascular Disease

## 2016-01-26 VITALS — BP 122/50 | HR 94 | Ht 61.0 in | Wt 226.0 lb

## 2016-01-26 DIAGNOSIS — R600 Localized edema: Secondary | ICD-10-CM

## 2016-01-26 DIAGNOSIS — D509 Iron deficiency anemia, unspecified: Secondary | ICD-10-CM

## 2016-01-26 DIAGNOSIS — R002 Palpitations: Secondary | ICD-10-CM

## 2016-01-26 DIAGNOSIS — Z9289 Personal history of other medical treatment: Secondary | ICD-10-CM

## 2016-01-26 DIAGNOSIS — I480 Paroxysmal atrial fibrillation: Secondary | ICD-10-CM

## 2016-01-26 DIAGNOSIS — I5033 Acute on chronic diastolic (congestive) heart failure: Secondary | ICD-10-CM

## 2016-01-26 DIAGNOSIS — I10 Essential (primary) hypertension: Secondary | ICD-10-CM

## 2016-01-26 DIAGNOSIS — N183 Chronic kidney disease, stage 3 (moderate): Secondary | ICD-10-CM

## 2016-01-26 DIAGNOSIS — Z87898 Personal history of other specified conditions: Secondary | ICD-10-CM

## 2016-01-26 MED ORDER — TORSEMIDE 20 MG PO TABS: ORAL_TABLET | ORAL | Status: DC

## 2016-01-26 MED ORDER — DILTIAZEM HCL ER COATED BEADS 120 MG PO CP24: 120.0000 mg | ORAL_CAPSULE | Freq: Every day | ORAL | Status: DC

## 2016-01-26 NOTE — Patient Instructions (Signed)
Your physician wants you to follow-up in: 6 months with Dr Reggy EyeKoneswaran You will receive a reminder letter in the mail two months in advance. If you don't receive a letter, please call our office to schedule the follow-up appointment.    INCREASE Diltiazem to 120 mg daily  INCREASE Torsemide to 40 mg am and 20 mg pm   Get lab work Tuesday May 9th    You have been referred to Dr Terrial RhodesJoseph  Coladonato, nephrologist        Thank you for choosing [facility] !

## 2016-01-26 NOTE — Progress Notes (Signed)
Patient ID: Yolanda [name], female   DOB: 1941/03/29, 75 y.o.   MRN: [id number]      SUBJECTIVE: The patient presents for post-hospitalization follow-up for acute on chronic diastolic heart failure. She also has chronic lower extremity swelling, likely related to venous stasis disease due to obesity. Echocardiogram 01/10/16 demonstrated vigorous left ventricular systolic function with normal regional wall motion, LVEF 65-70%, mild LVH, grade 2 diastolic dysfunction with elevated filling pressures, and aortic valve sclerosis without stenosis.  She saw K. Lawrence NP on 4/25 complaining of lower extremity swelling with blood pressure 106/58. Diltiazem was reduced to 90 mg and losartan reduced to 50 mg..  Hemoglobin 9.1 on 01/17/16.  She said she feels better. She would like to start seeing a nephrologist due to her chronic kidney disease and I agree with this. She denies chest pain and said her breathing has improved. She still has bilateral leg edema but this has improved as well. Since her diltiazem dose was reduced she feels short of breath and has palpitations with exertion.  01/17/16 BUN 23, creatinine 1.83.  She is scheduled to see Gastroenterology (Dr. Karilyn Cotaehman) next Tuesday regarding her anemia.  ECG performed in the office today which I personally interpreted demonstrates sinus rhythm with a right bundle branch block.  Review of Systems: As per "subjective", otherwise negative.  Allergies  Allergen Reactions  . Ciprofloxacin Shortness Of Breath    REACTION: sob,tachycardia  . Doxycycline Nausea Only    Also experienced diarrhea   . Fish Oil     Patient face drew to the side,Bells Palsey  . Guaifenesin Shortness Of Breath    REACTION: sob,tachycardia  . Sulfamethoxazole W/Trimethoprim 800-160 [Sulfamethoxazole-Trimethoprim] Nausea Only    Also lack of appetite   . Uloric [Febuxostat] Swelling    No urination  . Adhesive [Tape] Other (See Comments)    Causes blisters on skin  .  Tramadol     insomnia    Current Outpatient Prescriptions  Medication Sig Dispense Refill  . potassium chloride SA (K-DUR,KLOR-CON) 20 MEQ tablet Take 20 mEq by mouth daily.    Marland Kitchen. acetaminophen (TYLENOL) 500 MG tablet Take 500-1,000 mg by mouth as needed for mild pain.     Marland Kitchen. albuterol (PROVENTIL HFA;VENTOLIN HFA) 108 (90 Base) MCG/ACT inhaler Inhale 2 puffs into the lungs every 6 (six) hours as needed for wheezing or shortness of breath.    Marland Kitchen. apixaban (ELIQUIS) 5 MG TABS tablet Take 1 tablet (5 mg total) by mouth 2 (two) times daily. 60 tablet 5  . cetirizine (ZYRTEC) 10 MG tablet Take 10 mg by mouth daily.    Marland Kitchen. diltiazem (CARDIZEM SR) 90 MG 12 hr capsule Take 1 capsule (90 mg total) by mouth daily. 30 capsule 6  . famotidine (PEPCID) 20 MG tablet One at bedtime (Patient taking differently: Take 20 mg by mouth at bedtime. ) 30 tablet 2  . flintstones complete (FLINTSTONES) 60 MG chewable tablet Chew 3 tablets by mouth daily.    . Fluticasone Furoate-Vilanterol 200-25 MCG/INH AEPB Inhale 1 puff into the lungs daily.    Marland Kitchen. gabapentin (NEURONTIN) 300 MG capsule Take 300-600 mg by mouth at bedtime. restless leg syndrome    . levalbuterol (XOPENEX) 0.63 MG/3ML nebulizer solution Take 3 mLs (0.63 mg total) by nebulization every 6 (six) hours as needed for wheezing or shortness of breath. 90 mL 12  . levothyroxine (SYNTHROID, LEVOTHROID) 137 MCG tablet Take 137 mcg by mouth daily before breakfast.    . losartan (COZAAR) 50  MG tablet Take 1 tablet (50 mg total) by mouth daily. 90 tablet 3  . Magnesium 250 MG TABS Take 1 tablet by mouth 2 (two) times daily.    . metolazone (ZAROXOLYN) 5 MG tablet Take 5 mg by mouth 2 (two) times daily.    Marland Kitchen omeprazole (PRILOSEC) 20 MG capsule Take 1 capsule (20 mg total) by mouth daily. 90 capsule 3  . torsemide (DEMADEX) 20 MG tablet Take 1 tablet (20 mg total) by mouth 2 (two) times daily. 60 tablet 12  . triamcinolone (NASACORT ALLERGY 24HR) 55 MCG/ACT AERO nasal  inhaler Place 2 sprays into the nose daily.    . VOLTAREN 1 % GEL Apply 1 application topically daily as needed (for pain).   0   No current facility-administered medications for this visit.    Past Medical History  Diagnosis Date  . Hypothyroidism   . Essential hypertension   . GERD (gastroesophageal reflux disease)   . COPD (chronic obstructive pulmonary disease) (HCC)   . Bronchitis   . Barrett esophagus   . Diastolic heart failure (HCC)   . PAF (paroxysmal atrial fibrillation) (HCC)   . CHF (congestive heart failure) (HCC)   . Cellulitis     Past Surgical History  Procedure Laterality Date  . Cholecystectomy    . Colonoscopy    . Upper gastrointestinal endoscopy    . Breast biopsy Right Sept, 2012    Social History   Social History  . Marital Status: Widowed    Spouse Name: N/A  . Number of Children: N/A  . Years of Education: N/A   Occupational History  . Not on file.   Social History Main Topics  . Smoking status: Former Smoker -- 0.50 packs/day for 30 years    Types: Cigarettes    Start date: 09/24/1958    Quit date: 11/13/1999  . Smokeless tobacco: Never Used  . Alcohol Use: No  . Drug Use: No  . Sexual Activity: Not on file   Other Topics Concern  . Not on file   Social History Narrative     Filed Vitals:   01/26/16 1105  BP: 122/50  Pulse: 94  Height:  (1.549 m)  Weight: 226 lb (102.513 kg)  SpO2: 93%    PHYSICAL EXAM General: NAD HEENT: Normal. Neck: No JVD, no thyromegaly. Lungs: Clear b/l. CV: Nondisplaced PMI. Regular rate and rhythm, normal S1/S2, no S3/S4, no murmur. 1+ pitting pretibial edema.  Abdomen: Soft, nontender,obese.  Neurologic: Alert and oriented x 3.  Psych: Normal affect. Musculoskeletal: Normal range of motion. Extremities: No clubbing or cyanosis.     ECG: Most recent ECG reviewed.      ASSESSMENT AND PLAN: 1. Palpitations/atrial fibrillation: Remains in sinus rhythm. Currently on long-acting  diltiazem 90 mg daily, and tolerating Eliquis for anticoagulation without difficulties. Will increase diltiazem back to 120 mg for tachy-palpitations.  2. Acute on chronic diastolic heart failure: Will increase torsemide to 40 mg q am and 20 mg q pm. Will check BMET next Tuesday when she sees GI. Lower extremity swelling also due to chronic venous insufficiency due to obesity.   3. Essential hypertension: Well controlled on current therapy. Monitor given diltiazem and torsemide dose adjustments.  4. CKD stage III: Will make nephrology referral.  Dispo: f/u 6 months.  Time spent: 40 minutes, of which greater than 50% was spent reviewing symptoms, relevant blood tests and studies, and discussing management plan with the patient.   Prentice Docker, M.D., F.A.C.C.

## 2016-01-30 ENCOUNTER — Telehealth: Payer: Self-pay | Admitting: Cardiovascular Disease

## 2016-01-30 NOTE — Telephone Encounter (Signed)
Will forward to Dr Koneswaran for advice 

## 2016-01-30 NOTE — Telephone Encounter (Signed)
I recently increased her diltiazem to 120 mg. Would have her come for a nurse visit (ECG, vitals) to confirm if she has in fact gone back into atrial fibrillation before adjusting diltiazem dose again. Would encourage her to take losartan daily.

## 2016-01-30 NOTE — Telephone Encounter (Signed)
Ernesto RutherfordLinda Walker, a nurse with [facility], called this morning to report the following:  - Patient stated she is not taking her Losartan regularly  - Patient's HR is rapid, but seems to be steady.   HR = 94 when resting  HR = 98 after walking through her home  - Patient's BP readings for today:  132/60 - reading per the patient as soon as she woke up. When she got out of bed she stated that she got dizzy and SOB  115/47 - reading per the patient after making half the bed.  132/62 - the first reading that Bonita QuinLinda, RN took when she arrived. Linda asked [name] to walk through the house. [name] was easily winded per her normal state.   162/60 - Linda, RN's, BP reading after [name] walked through the house a bit.  Bonita QuinLinda said that the patient seems nervous that she may be going back into AFIB. Bonita QuinLinda told [name] that she would call Dr. Charm RingsK's office to see if there is anything to be concerned about. We may call Bonita QuinLinda back at [phone] if we have any questions, otherwise, Bonita QuinLinda feels that [name] would like to hear from our office directly so please call [name] at [phone] with any instructions from Dr. Kirtland BouchardK.

## 2016-01-30 NOTE — Telephone Encounter (Signed)
[facility] and relayed MD message,she will ask pt to call us to make a nurse apt as she has several MD apt's this week.

## 2016-01-30 NOTE — Telephone Encounter (Signed)
I accidentally closed this encounter when I went to route it - please see my note from 01/30/16 @ 11:41am

## 2016-01-31 ENCOUNTER — Ambulatory Visit (INDEPENDENT_AMBULATORY_CARE_PROVIDER_SITE_OTHER): Payer: Medicare Other | Admitting: Internal Medicine

## 2016-01-31 ENCOUNTER — Ambulatory Visit (INDEPENDENT_AMBULATORY_CARE_PROVIDER_SITE_OTHER): Payer: Medicare Other

## 2016-01-31 ENCOUNTER — Encounter (INDEPENDENT_AMBULATORY_CARE_PROVIDER_SITE_OTHER): Payer: Self-pay | Admitting: Internal Medicine

## 2016-01-31 VITALS — BP 100/84 | HR 84 | Temp 97.9°F | Ht 61.5 in | Wt 226.0 lb

## 2016-01-31 DIAGNOSIS — K7689 Other specified diseases of liver: Secondary | ICD-10-CM | POA: Diagnosis not present

## 2016-01-31 DIAGNOSIS — K769 Liver disease, unspecified: Secondary | ICD-10-CM

## 2016-01-31 DIAGNOSIS — K2270 Barrett's esophagus without dysplasia: Secondary | ICD-10-CM | POA: Diagnosis not present

## 2016-01-31 DIAGNOSIS — K219 Gastro-esophageal reflux disease without esophagitis: Secondary | ICD-10-CM

## 2016-01-31 DIAGNOSIS — I4891 Unspecified atrial fibrillation: Secondary | ICD-10-CM | POA: Diagnosis not present

## 2016-01-31 NOTE — Patient Instructions (Addendum)
Repeat MRI in August of 2017 OV in 1 year.

## 2016-01-31 NOTE — Progress Notes (Addendum)
Subjective:    Patient ID: Yolanda [name], female    DOB: November 08, 1940, 75 y.o.   MRN: [id number]  HPI HPI Here for one year follow up. Hx of iron deficiency anemia and GERD. Hx of short segment Barrett's esophagus. Her last EGD was in 2012 and was negative for dysplasia. Her acid reflux is controlled with PPIs. Hx of elevated liver enzymes which are coming down. She will need a f/u MRI abdomen in August for benign liver lesion.  Recently admitted to [facility] last month with CHF, atrial fib and edema to her lower extremities.  She tells me today she feels wore out. She is not sleeping well. She just feels tired. Appetite is good. Last weight one year ago 228. Today her weight is 226. She says she never has indigestion.  She has a BM daily. No melena or BRRB.  Hx of Chronic renal disease.    Hx of paroxysmal atrial fib and CHF.    Hepatic Function Latest Ref Rng 06/10/2015 05/03/2015 01/27/2015  Total Protein 6.1 - 8.1 g/dL 5.9(L) 6.3 6.3  Albumin 3.6 - 5.1 g/dL 3.6 3.7 3.7  AST 10 - 35 U/L 33 43(H) 65(H)  ALT 6 - 29 U/L 35(H) 37(H) 45(H)  Alk Phosphatase 33 - 130 U/L 65 69 54  Total Bilirubin 0.2 - 1.2 mg/dL 0.5 0.5 0.5  Bilirubin, Direct <=0.2 mg/dL 0.1 0.1 -    01/10/2016 Iron 54, TIBC 435, Ferritin 64 06/29/2014 Ferritin 15, Iron 62.   CBC    Component Value Date/Time   WBC 11.2* 01/17/2016 1604   RBC 3.03* 01/17/2016 1604   RBC 2.95* 01/10/2016 0932   HGB 9.1* 01/17/2016 1604   HCT 26.9* 01/17/2016 1604   PLT 281 01/17/2016 1604   MCV 88.8 01/17/2016 1604   MCH 30.0 01/17/2016 1604   MCHC 33.8 01/17/2016 1604   RDW 15.6* 01/17/2016 1604   LYMPHSABS 1680 01/17/2016 1604   MONOABS 784 01/17/2016 1604   EOSABS 224 01/17/2016 1604   BASOSABS 0 01/17/2016 1604   CMP     Component Value Date/Time   NA 126* 01/17/2016 1604   K 4.7 01/17/2016 1604   CL 86* 01/17/2016 1604   CO2 28 01/17/2016 1604   GLUCOSE 110* 01/17/2016 1604   BUN 23 01/17/2016 1604   CREATININE 1.83*  01/17/2016 1604   CREATININE 1.56* 01/15/2016 1548   CALCIUM 8.7 01/17/2016 1604   PROT 5.9* 06/10/2015 1431   ALBUMIN 3.6 06/10/2015 1431   AST 33 06/10/2015 1431   ALT 35* 06/10/2015 1431   ALKPHOS 65 06/10/2015 1431   BILITOT 0.5 06/10/2015 1431   GFRNONAA 32* 01/15/2016 1548   GFRAA 37* 01/15/2016 1548          05/03/2015 MRI abdomen w/wo CM: elevated liver enzymes, ? Liver lesions IMPRESSION: 1. Hepatomegaly with hepatic steatosis. No morphologic changes of cirrhosis. 2. The previously demonstrated small lesion in the right lobe of the liver is only visualized on the delayed post-contrast images on which it is hyperintense. There is no T2 signal abnormality, restricted diffusion or arterial phase enhancement within this lesion. These features favor a benign etiology, possibly an atypical focus of focal nodular hyperplasia or a regenerating nodule. Patient did have a borderline AFP of 6.1 on 01/27/2015 and repeat AFP levels are pending. Follow-up MRI in 6-12 month should be considered to assess the stability of this finding, especially if the AFP levels are rising. 3. Probable reactive nodes within the porta hepatis.  Review of Systems  Past Medical History  Diagnosis Date  . Hypothyroidism   . Essential hypertension   . GERD (gastroesophageal reflux disease)   . COPD (chronic obstructive pulmonary disease) ([place])   . Bronchitis   . Barrett esophagus   . Diastolic heart failure ([place])   . PAF (paroxysmal atrial fibrillation) ([place])   . CHF (congestive heart failure) ([place])   . Cellulitis     Past Surgical History  Procedure Laterality Date  . Cholecystectomy    . Colonoscopy    . Upper gastrointestinal endoscopy    . Breast biopsy Right Sept, 2012    Allergies  Allergen Reactions  . Ciprofloxacin Shortness Of Breath    REACTION: sob,tachycardia  . Doxycycline Nausea Only    Also experienced diarrhea   . Fish Oil     Patient face drew to the  side,Bells Palsey  . Guaifenesin Shortness Of Breath    REACTION: sob,tachycardia  . Sulfamethoxazole W/Trimethoprim 800-160 [Sulfamethoxazole-Trimethoprim] Nausea Only    Also lack of appetite   . Uloric [Febuxostat] Swelling    No urination  . Adhesive [Tape] Other (See Comments)    Causes blisters on skin  . Tramadol     insomnia    Current Outpatient Prescriptions on File Prior to Visit  Medication Sig Dispense Refill  . acetaminophen (TYLENOL) 500 MG tablet Take 500-1,000 mg by mouth as needed for mild pain.     Marland Kitchen albuterol (PROVENTIL HFA;VENTOLIN HFA) 108 (90 Base) MCG/ACT inhaler Inhale 2 puffs into the lungs every 6 (six) hours as needed for wheezing or shortness of breath.    Marland Kitchen apixaban (ELIQUIS) 5 MG TABS tablet Take 1 tablet (5 mg total) by mouth 2 (two) times daily. 60 tablet 5  . cetirizine (ZYRTEC) 10 MG tablet Take 10 mg by mouth daily.    Marland Kitchen diltiazem (CARDIZEM CD) 120 MG 24 hr capsule Take 1 capsule (120 mg total) by mouth daily. 90 capsule 3  . famotidine (PEPCID) 20 MG tablet One at bedtime (Patient taking differently: Take 20 mg by mouth at bedtime. ) 30 tablet 2  . flintstones complete (FLINTSTONES) 60 MG chewable tablet Chew 3 tablets by mouth daily.    . Fluticasone Furoate-Vilanterol 200-25 MCG/INH AEPB Inhale 1 puff into the lungs daily.    Marland Kitchen gabapentin (NEURONTIN) 300 MG capsule Take 300-600 mg by mouth at bedtime. restless leg syndrome    . levalbuterol (XOPENEX) 0.63 MG/3ML nebulizer solution Take 3 mLs (0.63 mg total) by nebulization every 6 (six) hours as needed for wheezing or shortness of breath. 90 mL 12  . levothyroxine (SYNTHROID, LEVOTHROID) 137 MCG tablet Take 137 mcg by mouth daily before breakfast.    . losartan (COZAAR) 50 MG tablet Take 1 tablet (50 mg total) by mouth daily. 90 tablet 3  . Magnesium 250 MG TABS Take 1 tablet by mouth 2 (two) times daily.    . metolazone (ZAROXOLYN) 5 MG tablet Take 5 mg by mouth 2 (two) times daily.    Marland Kitchen omeprazole  (PRILOSEC) 20 MG capsule Take 1 capsule (20 mg total) by mouth daily. 90 capsule 3  . potassium chloride SA (K-DUR,KLOR-CON) 20 MEQ tablet Take 20 mEq by mouth daily.    Marland Kitchen torsemide (DEMADEX) 20 MG tablet Take 40 mg (2 tablets) in the am, and 20 mg ( 1 tablet) in the pm 270 tablet 3  . triamcinolone (NASACORT ALLERGY 24HR) 55 MCG/ACT AERO nasal inhaler Place 2 sprays into the nose daily.    Marland Kitchen  VOLTAREN 1 % GEL Apply 1 application topically daily as needed (for pain).   0   No current facility-administered medications on file prior to visit.        Objective:   Physical ExamBlood pressure 100/84, pulse 84, temperature 97.9 F (36.6 C), height 5' 1.5" (1.562 m), weight 226 lb (102.513 kg). Alert and oriented. Skin warm and dry. Oral mucosa is moist.   . Sclera anicteric, conjunctivae is pink. Thyroid not enlarged. No cervical lymphadenopathy. Lungs clear. Heart regular rate and rhythm.  Abdomen is soft. Bowel sounds are positive. No hepatomegaly. No abdominal masses felt. No tenderness.  2+ edema to lower extremities.          Assessment & Plan:  Repeat MRI in August of this year. Barrett's esophagus. Acid reflux controlled with Omeprzole. Hepatic function today for hx of elevated liver enzymes OV in 1 year.

## 2016-02-01 LAB — BASIC METABOLIC PANEL
BUN: 27 mg/dL — ABNORMAL HIGH (ref 7–25)
CALCIUM: 9.3 mg/dL (ref 8.6–10.4)
CHLORIDE: 93 mmol/L — AB (ref 98–110)
CO2: 24 mmol/L (ref 20–31)
CREATININE: 1.51 mg/dL — AB (ref 0.60–0.93)
Glucose, Bld: 105 mg/dL — ABNORMAL HIGH (ref 65–99)
Potassium: 4.8 mmol/L (ref 3.5–5.3)
Sodium: 133 mmol/L — ABNORMAL LOW (ref 135–146)

## 2016-02-02 ENCOUNTER — Ambulatory Visit (INDEPENDENT_AMBULATORY_CARE_PROVIDER_SITE_OTHER): Payer: Medicare Other | Admitting: Cardiology

## 2016-02-02 ENCOUNTER — Encounter: Payer: Self-pay | Admitting: Cardiology

## 2016-02-02 VITALS — BP 136/52 | HR 110 | Ht 61.0 in | Wt 224.0 lb

## 2016-02-02 DIAGNOSIS — I4510 Unspecified right bundle-branch block: Secondary | ICD-10-CM

## 2016-02-02 DIAGNOSIS — G4700 Insomnia, unspecified: Secondary | ICD-10-CM

## 2016-02-02 DIAGNOSIS — J449 Chronic obstructive pulmonary disease, unspecified: Secondary | ICD-10-CM

## 2016-02-02 DIAGNOSIS — I4891 Unspecified atrial fibrillation: Secondary | ICD-10-CM | POA: Diagnosis not present

## 2016-02-02 DIAGNOSIS — Z7901 Long term (current) use of anticoagulants: Secondary | ICD-10-CM | POA: Diagnosis not present

## 2016-02-02 DIAGNOSIS — E669 Obesity, unspecified: Secondary | ICD-10-CM

## 2016-02-02 DIAGNOSIS — R609 Edema, unspecified: Secondary | ICD-10-CM

## 2016-02-02 DIAGNOSIS — G4730 Sleep apnea, unspecified: Secondary | ICD-10-CM | POA: Insufficient documentation

## 2016-02-02 DIAGNOSIS — I480 Paroxysmal atrial fibrillation: Secondary | ICD-10-CM

## 2016-02-02 DIAGNOSIS — I5032 Chronic diastolic (congestive) heart failure: Secondary | ICD-10-CM

## 2016-02-02 MED ORDER — METOPROLOL TARTRATE 25 MG PO TABS: 12.5000 mg | ORAL_TABLET | Freq: Two times a day (BID) | ORAL | Status: DC

## 2016-02-02 NOTE — Progress Notes (Signed)
02/02/2016 Yolanda [name]   08-28-1941  [id number]  Primary Physician HAWKINS,EDWARD L, MD Primary Cardiologist: Dr Purvis Sheffield  HPI:  75 y/o obese female followed by DR Purvis Sheffield for PAF, COPD, chronic diastolic CHF, and HTN. She was admitted to [facility] in April 2017 with multifactorial respiratory failure and cellulitis of the LE. Echo showed her EF to be 65-70% with grade 2 DD. She has remained in NSR (previously converted spontaneously). She has had some problems with LE edema after discharge that improved on lower doses of Diltiazem. She is in the office today because of tachycardia and DOE at home. She says any activity wears her out "like taking a shower". She also can feel her heart racing and noted a HR of 126 on a home monitor. In the office she is in NSR-94.    Current Outpatient Prescriptions  Medication Sig Dispense Refill  . acetaminophen (TYLENOL) 500 MG tablet Take 500-1,000 mg by mouth as needed for mild pain.     Marland Kitchen albuterol (PROVENTIL HFA;VENTOLIN HFA) 108 (90 Base) MCG/ACT inhaler Inhale 2 puffs into the lungs every 6 (six) hours as needed for wheezing or shortness of breath.    Marland Kitchen apixaban (ELIQUIS) 5 MG TABS tablet Take 1 tablet (5 mg total) by mouth 2 (two) times daily. 60 tablet 5  . cetirizine (ZYRTEC) 10 MG tablet Take 10 mg by mouth daily.    Marland Kitchen diltiazem (CARDIZEM CD) 120 MG 24 hr capsule Take 1 capsule (120 mg total) by mouth daily. 90 capsule 3  . famotidine (PEPCID) 20 MG tablet One at bedtime (Patient taking differently: Take 20 mg by mouth at bedtime. ) 30 tablet 2  . febuxostat (ULORIC) 40 MG tablet Take 40 mg by mouth daily.    . flintstones complete (FLINTSTONES) 60 MG chewable tablet Chew 3 tablets by mouth daily.    . Fluticasone Furoate-Vilanterol 200-25 MCG/INH AEPB Inhale 1 puff into the lungs daily.    Marland Kitchen gabapentin (NEURONTIN) 300 MG capsule Take 300-600 mg by mouth at bedtime. restless leg syndrome    . levalbuterol (XOPENEX) 0.63 MG/3ML nebulizer  solution Take 3 mLs (0.63 mg total) by nebulization every 6 (six) hours as needed for wheezing or shortness of breath. 90 mL 12  . levothyroxine (SYNTHROID, LEVOTHROID) 137 MCG tablet Take 137 mcg by mouth daily before breakfast.    . losartan (COZAAR) 50 MG tablet Take 1 tablet (50 mg total) by mouth daily. 90 tablet 3  . Magnesium 250 MG TABS Take 1 tablet by mouth 2 (two) times daily.    . metolazone (ZAROXOLYN) 5 MG tablet Take 5 mg by mouth 2 (two) times daily.    Marland Kitchen omeprazole (PRILOSEC) 20 MG capsule Take 1 capsule (20 mg total) by mouth daily. 90 capsule 3  . potassium chloride SA (K-DUR,KLOR-CON) 20 MEQ tablet Take 20 mEq by mouth daily.    Marland Kitchen torsemide (DEMADEX) 20 MG tablet Take 40 mg (2 tablets) in the am, and 20 mg ( 1 tablet) in the pm 270 tablet 3  . triamcinolone (NASACORT ALLERGY 24HR) 55 MCG/ACT AERO nasal inhaler Place 2 sprays into the nose daily.    . VOLTAREN 1 % GEL Apply 1 application topically daily as needed (for pain).   0  . metoprolol tartrate (LOPRESSOR) 25 MG tablet Take 0.5 tablets (12.5 mg total) by mouth 2 (two) times daily. 90 tablet 3   No current facility-administered medications for this visit.    Allergies  Allergen Reactions  .  Ciprofloxacin Shortness Of Breath    REACTION: sob,tachycardia  . Doxycycline Nausea Only    Also experienced diarrhea   . Fish Oil     Patient face drew to the side,Bells Palsey  . Guaifenesin Shortness Of Breath    REACTION: sob,tachycardia  . Sulfamethoxazole W/Trimethoprim 800-160 [Sulfamethoxazole-Trimethoprim] Nausea Only    Also lack of appetite   . Uloric [Febuxostat] Swelling    No urination  . Adhesive [Tape] Other (See Comments)    Causes blisters on skin  . Tramadol     insomnia    Social History   Social History  . Marital Status: Widowed    Spouse Name: N/A  . Number of Children: N/A  . Years of Education: N/A   Occupational History  . Not on file.   Social History Main Topics  . Smoking status:  Former Smoker -- 0.50 packs/day for 30 years    Types: Cigarettes    Start date: 09/24/1958    Quit date: 11/13/1999  . Smokeless tobacco: Never Used  . Alcohol Use: No  . Drug Use: No  . Sexual Activity: Not on file   Other Topics Concern  . Not on file   Social History Narrative     Review of Systems: General: negative for chills, fever, night sweats or weight changes. The pt complains of poor sleep patterns-"insomnia". She snores. Daytime fatigue  Cardiovascular: negative for chest pain, dyspnea on exertion, edema, orthopnea, palpitations, paroxysmal nocturnal dyspnea or shortness of breath Dermatological: negative for rash Respiratory: negative for cough or wheezing Urologic: negative for hematuria Abdominal: negative for nausea, vomiting, diarrhea, bright red blood per rectum, melena, or hematemesis Neurologic: negative for visual changes, syncope, or dizziness All other systems reviewed and are otherwise negative except as noted above.    Blood pressure 136/52, pulse 110, height 5\' 1"  (1.549 m), weight 224 lb (101.606 kg), SpO2 90 %.  General appearance: alert, cooperative, no distress and morbidly obese Neck: no carotid bruit and no JVD Lungs: clear to auscultation bilaterally Heart: regular rate and rhythm Extremities: 1-2+ edema Skin: pale cool dry Neurologic: Grossly normal  EKG NSR, RBBB  TSH-WNL 01/06/16  ASSESSMENT AND PLAN:   PAF (paroxysmal atrial fibrillation) (HCC) She is in the office today because she felt she was back in AF- HR 126 at home, DOE.  In the office she is in NSR  Chronic anticoagulation Eliquis- CHADs VASc=4  COPD  II  Followed in Pulmonary clinic/ [facility]/ [facility]  Chronic edema Chronic LE edema and recent cellultis  Obesity BMI 42  Sleep apnea Sleep apnea by history- obese, "insomnia", HTN, arrythmia  RBBB Chronic  Chronic diastolic CHF (congestive heart failure) (HCC) Echo 01/10/16- EF 65-70% with grade 2  DD She does not appear to be in CHF. She follows her wgts and they have been stable.    PLAN  I added Metoprolol 12.5 mg BID. I set her up for a sleep study, her sister has sleep apnea so she understands what is involved. If she has recurrent AF with RVR we may have to consider an antiarrythmic.   Corine ShelterLuke Amairani Shuey PA-C 02/02/2016 4:24 PM

## 2016-02-02 NOTE — Assessment & Plan Note (Signed)
Followed in Pulmonary clinic/ [facility]/ [facility] 

## 2016-02-02 NOTE — Assessment & Plan Note (Signed)
Echo 01/10/16- EF 65-70% with grade 2 DD She does not appear to be in CHF. She follows her wgts and they have been stable.

## 2016-02-02 NOTE — Assessment & Plan Note (Signed)
BMI 42 

## 2016-02-02 NOTE — Assessment & Plan Note (Signed)
Sleep apnea by history- obese, "insomnia", HTN, arrythmia

## 2016-02-02 NOTE — Assessment & Plan Note (Signed)
Eliquis- CHADs VASc=4

## 2016-02-02 NOTE — Patient Instructions (Signed)
Your physician recommends that you schedule a follow-up appointment with Dr. Purvis SheffieldKoneswaran after your Sleep Study  Your physician has recommended you make the following change in your medication:   Start Metoprolol Tartrate 12.5 mg Two Times Daily  Your physician has recommended that you have a sleep study. This test records several body functions during sleep, including: brain activity, eye movement, oxygen and carbon dioxide blood levels, heart rate and rhythm, breathing rate and rhythm, the flow of air through your mouth and nose, snoring, body muscle movements, and chest and belly movement.  If you need a refill on your cardiac medications before your next appointment, please call your pharmacy.  Thank you for choosing [facility]!

## 2016-02-02 NOTE — Assessment & Plan Note (Signed)
Chronic LE edema and recent cellultis

## 2016-02-02 NOTE — Assessment & Plan Note (Signed)
Chronic. 

## 2016-02-02 NOTE — Assessment & Plan Note (Addendum)
She is in the office today because she felt she was back in AF- HR 126 at home, DOE.  In the office she is in NSR

## 2016-02-08 ENCOUNTER — Telehealth: Payer: Self-pay | Admitting: Cardiology

## 2016-02-08 NOTE — Telephone Encounter (Signed)
Pt states that she has HHN in the home for 2 more weeks.

## 2016-02-08 NOTE — Telephone Encounter (Signed)
Pt would like to know if she is supposed to monitoring anything since her sleep study is a month away and she has not heard anything from Nephrology that Dr. Purvis SheffieldKoneswaran referred her too

## 2016-03-07 ENCOUNTER — Ambulatory Visit: Payer: Medicare Other | Attending: Cardiology | Admitting: Neurology

## 2016-03-07 DIAGNOSIS — Z7901 Long term (current) use of anticoagulants: Secondary | ICD-10-CM | POA: Insufficient documentation

## 2016-03-07 DIAGNOSIS — I480 Paroxysmal atrial fibrillation: Secondary | ICD-10-CM

## 2016-03-07 DIAGNOSIS — G4700 Insomnia, unspecified: Secondary | ICD-10-CM | POA: Insufficient documentation

## 2016-03-07 DIAGNOSIS — Z6841 Body Mass Index (BMI) 40.0 and over, adult: Secondary | ICD-10-CM | POA: Insufficient documentation

## 2016-03-07 DIAGNOSIS — Z79899 Other long term (current) drug therapy: Secondary | ICD-10-CM | POA: Insufficient documentation

## 2016-03-07 DIAGNOSIS — R0683 Snoring: Secondary | ICD-10-CM | POA: Diagnosis not present

## 2016-03-07 DIAGNOSIS — G479 Sleep disorder, unspecified: Secondary | ICD-10-CM | POA: Diagnosis not present

## 2016-03-07 DIAGNOSIS — E669 Obesity, unspecified: Secondary | ICD-10-CM | POA: Diagnosis not present

## 2016-03-10 NOTE — Procedures (Signed)
[facility] Vita Currin A. Gerilyn Pilgrimoonquah, MD     [email or web address]             NOCTURNAL POLYSOMNOGRAPHY   LOCATION: [facility]  Patient Name: Yolanda [name], Yolanda [name] Study Date: 03/07/2016 Gender: Female D.O.B: 1940/10/17 Age (years): 89 Referring Provider: Not Available Height (inches): 61 Interpreting Physician: Beryle BeamsKofi Sydnei Ohaver MD, ABSM Weight (lbs): 218 RPSGT: Peak, Robert BMI: 41 MRN: [id number] Neck Size: 16.00 CLINICAL INFORMATION Sleep Study Type: NPSG Indication for sleep study: Obesity Epworth Sleepiness Score: 10 SLEEP STUDY TECHNIQUE As per the AASM Manual for the Scoring of Sleep and Associated Events v2.3 (April 2016) with a hypopnea requiring 4% desaturations. The channels recorded and monitored were frontal, central and occipital EEG, electrooculogram (EOG), submentalis EMG (chin), nasal and oral airflow, thoracic and abdominal wall motion, anterior tibialis EMG, snore microphone, electrocardiogram, and pulse oximetry. MEDICATIONS Patient's medications include: N/A. Medications self-administered by patient during sleep study : No sleep medicine administered.  Current outpatient prescriptions:  .  acetaminophen (TYLENOL) 500 MG tablet, Take 500-1,000 mg by mouth as needed for mild pain. , Disp: , Rfl:  .  albuterol (PROVENTIL HFA;VENTOLIN HFA) 108 (90 Base) MCG/ACT inhaler, Inhale 2 puffs into the lungs every 6 (six) hours as needed for wheezing or shortness of breath., Disp: , Rfl:  .  apixaban (ELIQUIS) 5 MG TABS tablet, Take 1 tablet (5 mg total) by mouth 2 (two) times daily., Disp: 60 tablet, Rfl: 5 .  cetirizine (ZYRTEC) 10 MG tablet, Take 10 mg by mouth daily., Disp: , Rfl:  .  diltiazem (CARDIZEM CD) 120 MG 24 hr capsule, Take 1 capsule (120 mg total) by mouth daily., Disp: 90 capsule, Rfl: 3 .  famotidine (PEPCID) 20 MG tablet, One at bedtime (Patient taking differently: Take 20 mg by mouth at bedtime. ), Disp: 30 tablet, Rfl: 2 .  febuxostat (ULORIC) 40 MG  tablet, Take 40 mg by mouth daily., Disp: , Rfl:  .  flintstones complete (FLINTSTONES) 60 MG chewable tablet, Chew 3 tablets by mouth daily., Disp: , Rfl:  .  Fluticasone Furoate-Vilanterol 200-25 MCG/INH AEPB, Inhale 1 puff into the lungs daily., Disp: , Rfl:  .  gabapentin (NEURONTIN) 300 MG capsule, Take 300-600 mg by mouth at bedtime. restless leg syndrome, Disp: , Rfl:  .  levalbuterol (XOPENEX) 0.63 MG/3ML nebulizer solution, Take 3 mLs (0.63 mg total) by nebulization every 6 (six) hours as needed for wheezing or shortness of breath., Disp: 90 mL, Rfl: 12 .  levothyroxine (SYNTHROID, LEVOTHROID) 137 MCG tablet, Take 137 mcg by mouth daily before breakfast., Disp: , Rfl:  .  losartan (COZAAR) 50 MG tablet, Take 1 tablet (50 mg total) by mouth daily., Disp: 90 tablet, Rfl: 3 .  Magnesium 250 MG TABS, Take 1 tablet by mouth 2 (two) times daily., Disp: , Rfl:  .  metolazone (ZAROXOLYN) 5 MG tablet, Take 5 mg by mouth 2 (two) times daily., Disp: , Rfl:  .  metoprolol tartrate (LOPRESSOR) 25 MG tablet, Take 0.5 tablets (12.5 mg total) by mouth 2 (two) times daily., Disp: 90 tablet, Rfl: 3 .  omeprazole (PRILOSEC) 20 MG capsule, Take 1 capsule (20 mg total) by mouth daily., Disp: 90 capsule, Rfl: 3 .  potassium chloride SA (K-DUR,KLOR-CON) 20 MEQ tablet, Take 20 mEq by mouth daily., Disp: , Rfl:  .  torsemide (DEMADEX) 20 MG tablet, Take 40 mg (2 tablets) in the am, and 20 mg ( 1 tablet) in the pm, Disp: 270 tablet, Rfl: 3 .  triamcinolone (NASACORT  ALLERGY 24HR) 55 MCG/ACT AERO nasal inhaler, Place 2 sprays into the nose daily., Disp: , Rfl:  .  VOLTAREN 1 % GEL, Apply 1 application topically daily as needed (for pain). , Disp: , Rfl: 0  SLEEP ARCHITECTURE The study was initiated at 10:19:58 PM and ended at 4:40:21 AM. Sleep onset time was 128.2 minutes and the sleep efficiency was 7.4%. The total sleep time was 28.0 minutes. Stage REM latency was N/A minutes. The patient spent 87.50% of the  night in stage N1 sleep, 12.50% in stage N2 sleep, 0.00% in stage N3 and 0.00% in REM. Alpha intrusion was absent. Supine sleep was 41.07%. RESPIRATORY PARAMETERS The overall apnea/hypopnea index (AHI) was 4.3 per hour. There were 0 total apneas, including 0 obstructive, 0 central and 0 mixed apneas. There were 2 hypopneas and 9 RERAs. The AHI during Stage REM sleep was N/A per hour. AHI while supine was 10.4 per hour. The mean oxygen saturation was 91.68%. The minimum SpO2 during sleep was 89.00%. snoring was noted during this study. CARDIAC DATA The 2 lead EKG demonstrated sinus rhythm. The mean heart rate was 63.82 beats per minute. Other EKG findings include: None. LEG MOVEMENT DATA The total PLMS were 0 with a resulting PLMS index of 0.00. Associated arousal with leg movement index was 0.0.    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 Because the sleep efficiency is so poor, sleep apnea cannot be ruled out. Suggest Home sleep study done with an appropriate hypnotic.    Argie Ramming, MD Diplomate, American Board of Sleep Medicine.

## 2016-03-15 ENCOUNTER — Other Ambulatory Visit: Payer: Self-pay | Admitting: Nephrology

## 2016-03-15 DIAGNOSIS — N183 Chronic kidney disease, stage 3 unspecified: Secondary | ICD-10-CM

## 2016-03-16 ENCOUNTER — Other Ambulatory Visit (HOSPITAL_COMMUNITY): Payer: Self-pay | Admitting: Nephrology

## 2016-03-16 DIAGNOSIS — N183 Chronic kidney disease, stage 3 unspecified: Secondary | ICD-10-CM

## 2016-03-20 ENCOUNTER — Telehealth: Payer: Self-pay | Admitting: Cardiology

## 2016-03-20 NOTE — Telephone Encounter (Signed)
I called pt to discuss sleep study report. She says she spoke with Dr Juanetta GoslingHawkins about this. As far as her HR is concerned she says this is doing well and she feels good. She is currently undergoing renal work up with Dr Darrick Pennaeterding. She'll contact us if she has any further cardiac issues.  Yolanda Gene PA-C 03/20/2016 8:00 AM

## 2016-03-21 ENCOUNTER — Ambulatory Visit (HOSPITAL_COMMUNITY)
Admission: RE | Admit: 2016-03-21 | Discharge: 2016-03-21 | Disposition: A | Discharge status: Home / Self Care | Payer: Medicare Other | Source: Ambulatory Visit | Attending: Nephrology | Admitting: Nephrology

## 2016-03-21 DIAGNOSIS — N183 Chronic kidney disease, stage 3 unspecified: Secondary | ICD-10-CM

## 2016-04-19 ENCOUNTER — Encounter (INDEPENDENT_AMBULATORY_CARE_PROVIDER_SITE_OTHER): Payer: Self-pay | Admitting: *Deleted

## 2016-05-01 ENCOUNTER — Other Ambulatory Visit (INDEPENDENT_AMBULATORY_CARE_PROVIDER_SITE_OTHER): Payer: Self-pay | Admitting: Internal Medicine

## 2016-05-01 ENCOUNTER — Encounter (INDEPENDENT_AMBULATORY_CARE_PROVIDER_SITE_OTHER): Payer: Self-pay

## 2016-05-01 ENCOUNTER — Encounter: Payer: Self-pay | Admitting: Cardiology

## 2016-05-01 DIAGNOSIS — K769 Liver disease, unspecified: Secondary | ICD-10-CM

## 2016-05-11 ENCOUNTER — Other Ambulatory Visit (INDEPENDENT_AMBULATORY_CARE_PROVIDER_SITE_OTHER): Payer: Self-pay | Admitting: Internal Medicine

## 2016-05-11 ENCOUNTER — Ambulatory Visit (HOSPITAL_COMMUNITY)
Admission: RE | Admit: 2016-05-11 | Discharge: 2016-05-11 | Disposition: A | Discharge status: Home / Self Care | Payer: Medicare Other | Source: Ambulatory Visit | Attending: Internal Medicine | Admitting: Internal Medicine

## 2016-05-11 DIAGNOSIS — K5730 Diverticulosis of large intestine without perforation or abscess without bleeding: Secondary | ICD-10-CM | POA: Diagnosis not present

## 2016-05-11 DIAGNOSIS — K769 Liver disease, unspecified: Secondary | ICD-10-CM | POA: Insufficient documentation

## 2016-05-11 DIAGNOSIS — N183 Chronic kidney disease, stage 3 (moderate): Secondary | ICD-10-CM | POA: Diagnosis not present

## 2016-05-11 DIAGNOSIS — K760 Fatty (change of) liver, not elsewhere classified: Secondary | ICD-10-CM | POA: Insufficient documentation

## 2016-05-11 LAB — POCT I-STAT CREATININE: CREATININE: 1.8 mg/dL — AB (ref 0.44–1.00)

## 2016-05-11 MED ORDER — GADOXETATE DISODIUM 0.25 MMOL/ML IV SOLN: 10.0000 mL | Freq: Once | INTRAVENOUS | Status: DC | PRN

## 2016-05-14 ENCOUNTER — Encounter (INDEPENDENT_AMBULATORY_CARE_PROVIDER_SITE_OTHER): Payer: Self-pay

## 2016-05-14 ENCOUNTER — Other Ambulatory Visit (INDEPENDENT_AMBULATORY_CARE_PROVIDER_SITE_OTHER): Payer: Self-pay | Admitting: *Deleted

## 2016-05-14 DIAGNOSIS — K2270 Barrett's esophagus without dysplasia: Secondary | ICD-10-CM

## 2016-05-14 DIAGNOSIS — E611 Iron deficiency: Secondary | ICD-10-CM

## 2016-05-15 ENCOUNTER — Encounter: Payer: Self-pay | Admitting: Cardiovascular Disease

## 2016-05-17 ENCOUNTER — Telehealth: Payer: Self-pay | Admitting: *Deleted

## 2016-05-17 NOTE — Telephone Encounter (Signed)
Message left on nurse voice mail - issue with bp, heart rate a little high past couple weeks.  Bad case of cellutitis - put on high dose of clindamycin, was starting to bother her with her esophagus & stomach issues.  At that time started have BP dropping low, could feel pulse in ears, monitoring bp & reported to pmd & stopped her clindamycin.  Legs were clearing up & from that point on she hasn't taken her bp pill, staying very very low, feels like if she takes bp pill it will cause her issues.  Heart rate going up to 100 sometimes without much activity, does go down with rest   Call placed to patient - no BP pill (Losartan) since last Saturday, 05/12/16.    8/23 -  116/49  102  8/22 - 79/57 108  8/22 - 100/43  86  8/22 - 112/48  91  8/21 - 114/54  98    8/20 - 131/63  87 - had a lot of company that day   8/20 - 123/59  112     8/20 - 110/55     8/19 - 102/52  89    SOB - slightly with increased heart rate, no chest pain or dizziness.

## 2016-05-18 NOTE — Telephone Encounter (Signed)
Patient notified & stated that she is taking her Metoprolol as suggested per L. Kilroy, PA.  OV scheduled for Friday, 05/25/2016 with Dr. Tenny Crawoss at 1:20 in our [place] office.

## 2016-05-18 NOTE — Telephone Encounter (Deleted)
I spoke with [facility],they will take care of spironolactone frequency change

## 2016-05-18 NOTE — Telephone Encounter (Signed)
I saw that she was supposed to be on low dose metoprolol 12.5 mg BID for HR control but it is not on her list. Should be on this unless PCP took her off of it. She will need office appointment for reassessment next week.

## 2016-05-21 ENCOUNTER — Encounter (INDEPENDENT_AMBULATORY_CARE_PROVIDER_SITE_OTHER): Payer: Self-pay | Admitting: *Deleted

## 2016-05-21 ENCOUNTER — Other Ambulatory Visit (INDEPENDENT_AMBULATORY_CARE_PROVIDER_SITE_OTHER): Payer: Self-pay | Admitting: *Deleted

## 2016-05-21 DIAGNOSIS — K2270 Barrett's esophagus without dysplasia: Secondary | ICD-10-CM

## 2016-05-21 DIAGNOSIS — E611 Iron deficiency: Secondary | ICD-10-CM

## 2016-05-24 NOTE — Progress Notes (Signed)
Cardiology Office Note   Date:  05/25/2016   ID:  Yolanda, [name] March 26, 1941, MRN [id number]  PCP:  Yolanda Maudlin, MD  Cardiologist:   Yolanda [name]    F/U of PAF and HTN     History of Present Illness: Yolanda [name] is a 75 y.o. female with a history of PAF *CHADSVASc 4) , COPD, chornic LE edema, leep apnea, RBBB Echo LVEF 65 to 70%   She was seen by L Kilroy in May    Seen in [place] by Yolanda [name]  Meds adjusted  She cannot afford  He adjusted torsemide  Doing better  ANkles down Seen by Yolanda [name]  Had cellulitis  Rx clindamycin  Lead to stomach upset BP low  Meds held losartan  Still taking metorplol and diltiazem   BP 110s    Not taking potassium  K was normal in July       Outpatient Medications Prior to Visit  Medication Sig Dispense Refill  . acetaminophen (TYLENOL) 500 MG tablet Take 500-1,000 mg by mouth as needed for mild pain.     Marland Kitchen albuterol (PROVENTIL HFA;VENTOLIN HFA) 108 (90 Base) MCG/ACT inhaler Inhale 2 puffs into the lungs every 6 (six) hours as needed for wheezing or shortness of breath.    Marland Kitchen apixaban (ELIQUIS) 5 MG TABS tablet Take 1 tablet (5 mg total) by mouth 2 (two) times daily. 60 tablet 5  . cetirizine (ZYRTEC) 10 MG tablet Take 10 mg by mouth daily.    Marland Kitchen diltiazem (CARDIZEM CD) 120 MG 24 hr capsule Take 1 capsule (120 mg total) by mouth daily. 90 capsule 3  . flintstones complete (FLINTSTONES) 60 MG chewable tablet Chew 3 tablets by mouth daily.    . Fluticasone Furoate-Vilanterol 200-25 MCG/INH AEPB Inhale 1 puff into the lungs daily.    Marland Kitchen gabapentin (NEURONTIN) 300 MG capsule Take 300-600 mg by mouth at bedtime. restless leg syndrome    . levalbuterol (XOPENEX) 0.63 MG/3ML nebulizer solution Take 3 mLs (0.63 mg total) by nebulization every 6 (six) hours as needed for wheezing or shortness of breath. 90 mL 12  . levothyroxine (SYNTHROID, LEVOTHROID) 137 MCG tablet Take 137 mcg by mouth daily before breakfast.    . Magnesium 250 MG TABS Take 1  tablet by mouth 2 (two) times daily.    . metoprolol tartrate (LOPRESSOR) 25 MG tablet Take 0.5 tablets (12.5 mg total) by mouth 2 (two) times daily. 90 tablet 3  . omeprazole (PRILOSEC) 20 MG capsule Take 1 capsule (20 mg total) by mouth daily. 90 capsule 3  . triamcinolone (NASACORT ALLERGY 24HR) 55 MCG/ACT AERO nasal inhaler Place 2 sprays into the nose daily.    . VOLTAREN 1 % GEL Apply 1 application topically daily as needed (for pain).   0  . losartan (COZAAR) 50 MG tablet Take 1 tablet (50 mg total) by mouth daily. (Patient not taking: Reported on 05/25/2016) 90 tablet 3  . famotidine (PEPCID) 20 MG tablet One at bedtime (Patient taking differently: Take 20 mg by mouth at bedtime. ) 30 tablet 2  . febuxostat (ULORIC) 40 MG tablet Take 40 mg by mouth daily.    . metolazone (ZAROXOLYN) 5 MG tablet Take 5 mg by mouth 2 (two) times daily.    . potassium chloride SA (K-DUR,KLOR-CON) 20 MEQ tablet Take 20 mEq by mouth daily.    Marland Kitchen torsemide (DEMADEX) 20 MG tablet Take 40 mg (2 tablets) in the am, and 20 mg ( 1  tablet) in the pm 270 tablet 3   No facility-administered medications prior to visit.      Allergies:   Ciprofloxacin; Doxycycline; Fish oil; Guaifenesin; Sulfamethoxazole w/trimethoprim 800-160 [sulfamethoxazole-trimethoprim]; Uloric [febuxostat]; Adhesive [tape]; Allopurinol; and Tramadol   Past Medical History:  Diagnosis Date  . Barrett esophagus   . Bronchitis   . Cellulitis   . CHF (congestive heart failure) (HCC)   . COPD (chronic obstructive pulmonary disease) (HCC)   . Diastolic heart failure (HCC)   . Essential hypertension   . GERD (gastroesophageal reflux disease)   . Hypothyroidism   . PAF (paroxysmal atrial fibrillation) (HCC)     Past Surgical History:  Procedure Laterality Date  . BREAST BIOPSY Right Sept, 2012  . CHOLECYSTECTOMY    . COLONOSCOPY    . UPPER GASTROINTESTINAL ENDOSCOPY       Social History:  The patient  reports that she quit smoking about  16 years ago. Her smoking use included Cigarettes. She started smoking about 57 years ago. She has a 15.00 pack-year smoking history. She has never used smokeless tobacco. She reports that she does not drink alcohol or use drugs.   Family History:  The patient's family history includes Dementia in her mother; Diabetes in her brother; Healthy in her sister; Hypertension in her sister and sister; Lung cancer in her father and maternal uncle; Obesity in her sister; Restless legs syndrome in her sister.    ROS:  Please see the history of present illness. All other systems are reviewed and  Negative to the above problem except as noted.    PHYSICAL EXAM: VS:  BP (!) 130/58   Pulse 83   Ht 5\' 1"  (1.549 m)   Wt 210 lb (95.3 kg)   SpO2 96%   BMI 39.68 kg/m   GEN: morbidly obese  in no acute distress  HEENT: normal  Neck: no JVD, carotid bruits, or masses Cardiac: RRR; no murmurs, rubs, or gallops,Tr edema  Respiratory:  clear to auscultation bilaterally, normal work of breathing GI: soft, nontender, nondistended, + BS  No hepatomegaly  MS: no deformity Moving all extremities   Skin: warm and dry, no rash Neuro:  Strength and sensation are intact Psych: euthymic mood, full affect   EKG:  EKG is not ordered today.    Lipid Panel No results found for: CHOL, TRIG, HDL, CHOLHDL, VLDL, LDLCALC, LDLDIRECT    Wt Readings from Last 3 Encounters:  05/25/16 210 lb (95.3 kg)  03/07/16 218 lb (98.9 kg)  02/02/16 224 lb (101.6 kg)      ASSESSMENT AND PLAN:  1  PAF  Curr appears to be in SR  No recent spells   Keep on anticoag  2  HTN  BP has been great   Keep on same regimen  Will need to discuss with renal  3  Edema   Improved   4.  Renal  Has appt with [name] in Jan    I instructed her to call to let him know about med changes      Signed, Dietrich PatesPaula Laketa Sandoz, MD  05/25/2016 1:53 PM    [facility] [address], [place], [place]  [address] Phone: [phone];  Fax: [phone]

## 2016-05-25 ENCOUNTER — Ambulatory Visit (INDEPENDENT_AMBULATORY_CARE_PROVIDER_SITE_OTHER): Payer: Medicare Other | Admitting: Internal Medicine

## 2016-05-25 ENCOUNTER — Encounter: Payer: Self-pay | Admitting: Internal Medicine

## 2016-05-25 VITALS — BP 130/58 | HR 83 | Ht 61.0 in | Wt 210.0 lb

## 2016-05-25 DIAGNOSIS — Z79899 Other long term (current) drug therapy: Secondary | ICD-10-CM

## 2016-05-25 DIAGNOSIS — I480 Paroxysmal atrial fibrillation: Secondary | ICD-10-CM

## 2016-05-25 DIAGNOSIS — M1000 Idiopathic gout, unspecified site: Secondary | ICD-10-CM

## 2016-05-25 LAB — CBC
HCT: 30.9 % — ABNORMAL LOW (ref 35.0–45.0)
HEMOGLOBIN: 9.9 g/dL — AB (ref 11.7–15.5)
MCH: 28.1 pg (ref 27.0–33.0)
MCHC: 32 g/dL (ref 32.0–36.0)
MCV: 87.8 fL (ref 80.0–100.0)
MPV: 9 fL (ref 7.5–12.5)
PLATELETS: 329 10*3/uL (ref 140–400)
RBC: 3.52 MIL/uL — AB (ref 3.80–5.10)
RDW: 15.5 % — ABNORMAL HIGH (ref 11.0–15.0)
WBC: 12.7 10*3/uL — AB (ref 3.8–10.8)

## 2016-05-25 NOTE — Patient Instructions (Signed)
Your physician recommends that you schedule a follow-up appointment in: Spring follow up with Dr Purvis SheffieldKoneswaran    Your physician recommends that you continue on your current medications as directed. Please refer to the Current Medication list given to you today.     Get lab work done       Thank you for [facility] !

## 2016-05-26 LAB — URIC ACID: Uric Acid, Serum: 14.1 mg/dL — ABNORMAL HIGH (ref 2.5–7.0)

## 2016-05-26 LAB — BASIC METABOLIC PANEL
BUN: 30 mg/dL — ABNORMAL HIGH (ref 7–25)
CALCIUM: 9.1 mg/dL (ref 8.6–10.4)
CHLORIDE: 98 mmol/L (ref 98–110)
CO2: 29 mmol/L (ref 20–31)
Creat: 1.41 mg/dL — ABNORMAL HIGH (ref 0.60–0.93)
GLUCOSE: 139 mg/dL — AB (ref 65–99)
POTASSIUM: 4 mmol/L (ref 3.5–5.3)
SODIUM: 136 mmol/L (ref 135–146)

## 2016-05-28 ENCOUNTER — Telehealth: Payer: Self-pay | Admitting: Physician Assistant

## 2016-05-28 NOTE — Telephone Encounter (Signed)
Notified by lab that her uric acid was 14, contacted the patient, informed her lab results. She does have mild gout flare, it is getting better with NSAID. She says she cannot take normal gout medication as they decreased urine output and increased swelling.    Ramond DialSigned, Estera Ozier PA Pager: [phone]

## 2016-05-29 ENCOUNTER — Other Ambulatory Visit (INDEPENDENT_AMBULATORY_CARE_PROVIDER_SITE_OTHER): Payer: Self-pay | Admitting: Internal Medicine

## 2016-06-11 ENCOUNTER — Encounter (INDEPENDENT_AMBULATORY_CARE_PROVIDER_SITE_OTHER): Payer: Self-pay | Admitting: Internal Medicine

## 2016-06-12 ENCOUNTER — Encounter (INDEPENDENT_AMBULATORY_CARE_PROVIDER_SITE_OTHER): Payer: Self-pay | Admitting: Internal Medicine

## 2016-06-12 ENCOUNTER — Telehealth (INDEPENDENT_AMBULATORY_CARE_PROVIDER_SITE_OTHER): Payer: Self-pay | Admitting: Internal Medicine

## 2016-06-12 NOTE — Telephone Encounter (Signed)
Patient has a question about iron infusions that Dr. Karilyn Cotaehman asked her about.

## 2016-06-13 NOTE — Telephone Encounter (Signed)
Per Dr.Rehman - may order Feraheme x2 . Will do a H&H 2 weeks after the second dose. Patient was called and made aware, she also made aware that a PA was going to need to be done,and that I would keep her updated.

## 2016-06-18 ENCOUNTER — Other Ambulatory Visit (INDEPENDENT_AMBULATORY_CARE_PROVIDER_SITE_OTHER): Payer: Self-pay | Admitting: *Deleted

## 2016-06-18 ENCOUNTER — Encounter (INDEPENDENT_AMBULATORY_CARE_PROVIDER_SITE_OTHER): Payer: Self-pay | Admitting: *Deleted

## 2016-06-18 DIAGNOSIS — D509 Iron deficiency anemia, unspecified: Secondary | ICD-10-CM

## 2016-06-21 ENCOUNTER — Encounter (HOSPITAL_COMMUNITY)
Admission: RE | Admit: 2016-06-21 | Discharge: 2016-06-21 | Disposition: A | Discharge status: Home / Self Care | Payer: Medicare Other | Source: Ambulatory Visit | Attending: Internal Medicine | Admitting: Internal Medicine

## 2016-06-21 DIAGNOSIS — D509 Iron deficiency anemia, unspecified: Secondary | ICD-10-CM | POA: Diagnosis present

## 2016-06-21 MED ORDER — SODIUM CHLORIDE 0.9 % IV SOLN
510.0000 mg | INTRAVENOUS | Status: DC
  Administered 2016-06-21: 510 mg via INTRAVENOUS
  Filled 2016-06-21: qty 17

## 2016-06-21 MED ORDER — SODIUM CHLORIDE 0.9 % IV SOLN
INTRAVENOUS | Status: DC
  Administered 2016-06-21: 200 mL via INTRAVENOUS

## 2016-06-28 ENCOUNTER — Encounter (HOSPITAL_COMMUNITY): Payer: Self-pay

## 2016-06-28 ENCOUNTER — Encounter (HOSPITAL_COMMUNITY)
Admission: RE | Admit: 2016-06-28 | Discharge: 2016-06-28 | Disposition: A | Discharge status: Home / Self Care | Payer: Medicare Other | Source: Ambulatory Visit | Attending: Internal Medicine | Admitting: Internal Medicine

## 2016-06-28 DIAGNOSIS — D509 Iron deficiency anemia, unspecified: Secondary | ICD-10-CM | POA: Insufficient documentation

## 2016-06-28 MED ORDER — SODIUM CHLORIDE 0.9 % IV SOLN
510.0000 mg | Freq: Once | INTRAVENOUS | Status: AC
  Administered 2016-06-28: 510 mg via INTRAVENOUS
  Filled 2016-06-28: qty 17

## 2016-06-28 MED ORDER — SODIUM CHLORIDE 0.9 % IV SOLN
INTRAVENOUS | Status: DC
  Administered 2016-06-28: 11:00:00 via INTRAVENOUS

## 2016-07-09 ENCOUNTER — Other Ambulatory Visit (HOSPITAL_COMMUNITY): Payer: Self-pay | Admitting: Pulmonary Disease

## 2016-07-09 ENCOUNTER — Ambulatory Visit (HOSPITAL_COMMUNITY)
Admission: RE | Admit: 2016-07-09 | Discharge: 2016-07-09 | Disposition: A | Discharge status: Home / Self Care | Payer: Medicare Other | Source: Ambulatory Visit | Attending: Pulmonary Disease | Admitting: Pulmonary Disease

## 2016-07-09 DIAGNOSIS — W19XXXA Unspecified fall, initial encounter: Secondary | ICD-10-CM

## 2016-07-09 DIAGNOSIS — M25422 Effusion, left elbow: Secondary | ICD-10-CM | POA: Insufficient documentation

## 2016-07-09 DIAGNOSIS — M79602 Pain in left arm: Secondary | ICD-10-CM | POA: Insufficient documentation

## 2016-07-09 DIAGNOSIS — M25522 Pain in left elbow: Secondary | ICD-10-CM | POA: Insufficient documentation

## 2016-07-12 ENCOUNTER — Ambulatory Visit (INDEPENDENT_AMBULATORY_CARE_PROVIDER_SITE_OTHER): Payer: Medicare Other

## 2016-07-12 ENCOUNTER — Ambulatory Visit (INDEPENDENT_AMBULATORY_CARE_PROVIDER_SITE_OTHER): Payer: Medicare Other | Admitting: Orthopaedic Surgery

## 2016-07-12 ENCOUNTER — Encounter: Payer: Self-pay | Admitting: Orthopaedic Surgery

## 2016-07-12 VITALS — BP 144/64 | HR 83 | Temp 97.5°F | Ht 61.5 in | Wt 217.0 lb

## 2016-07-12 DIAGNOSIS — M25532 Pain in left wrist: Secondary | ICD-10-CM | POA: Diagnosis not present

## 2016-07-12 DIAGNOSIS — M25522 Pain in left elbow: Secondary | ICD-10-CM

## 2016-07-12 LAB — HEMOGLOBIN AND HEMATOCRIT, BLOOD
HCT: 34.4 % — ABNORMAL LOW (ref 35.0–45.0)
Hemoglobin: 11.2 g/dL — ABNORMAL LOW (ref 11.7–15.5)

## 2016-07-12 NOTE — Progress Notes (Signed)
Subjective:  I fell and hurt my left wrist and left elbow    Patient ID: Yolanda [name], female    DOB: 01-Aug-1941, 75 y.o.   MRN: [id number]  HPI She fell on 07-08-16 and hurt her left elbow and left wrist.  X-rays showed effusion of the elbow with note of possible occult fracture and left wrist was negative.  She continues to hurt.  I have reviewed the x-rays and the notes.  Her elbow is less tender but she lacks full motion.  She has ecchymosis of the elbow laterally.  She has pain and swelling of the wrist dorsally.  She has no wound.  She has history of gout and unable to take allopurinol and Uloric.  She saw Dr. Juanetta GoslingHawkins and was referred here.   Review of Systems  HENT: Negative for congestion.   Respiratory: Negative for cough and shortness of breath.   Cardiovascular: Negative for chest pain and leg swelling.  Endocrine: Positive for cold intolerance.  Musculoskeletal: Positive for arthralgias and joint swelling.  Allergic/Immunologic: Positive for environmental allergies.   Past Medical History:  Diagnosis Date  . Barrett esophagus   . Bronchitis   . Cellulitis   . CHF (congestive heart failure) (HCC)   . COPD (chronic obstructive pulmonary disease) (HCC)   . Diastolic heart failure (HCC)   . Essential hypertension   . GERD (gastroesophageal reflux disease)   . Hypothyroidism   . PAF (paroxysmal atrial fibrillation) (HCC)     Past Surgical History:  Procedure Laterality Date  . BREAST BIOPSY Right Sept, 2012  . CHOLECYSTECTOMY    . COLONOSCOPY    . UPPER GASTROINTESTINAL ENDOSCOPY      Current Outpatient Prescriptions on File Prior to Visit  Medication Sig Dispense Refill  . acetaminophen (TYLENOL) 500 MG tablet Take 500-1,000 mg by mouth as needed for mild pain.     Marland Kitchen. albuterol (PROVENTIL HFA;VENTOLIN HFA) 108 (90 Base) MCG/ACT inhaler Inhale 2 puffs into the lungs every 6 (six) hours as needed for wheezing or shortness of breath.    Marland Kitchen. apixaban (ELIQUIS) 5  MG TABS tablet Take 1 tablet (5 mg total) by mouth 2 (two) times daily. 60 tablet 5  . cetirizine (ZYRTEC) 10 MG tablet Take 10 mg by mouth daily.    Marland Kitchen. diltiazem (CARDIZEM CD) 120 MG 24 hr capsule Take 1 capsule (120 mg total) by mouth daily. 90 capsule 3  . flintstones complete (FLINTSTONES) 60 MG chewable tablet Chew 3 tablets by mouth daily.    . Fluticasone Furoate-Vilanterol 200-25 MCG/INH AEPB Inhale 1 puff into the lungs daily.    Marland Kitchen. gabapentin (NEURONTIN) 300 MG capsule Take 300-600 mg by mouth at bedtime. restless leg syndrome    . levalbuterol (XOPENEX) 0.63 MG/3ML nebulizer solution Take 3 mLs (0.63 mg total) by nebulization every 6 (six) hours as needed for wheezing or shortness of breath. 90 mL 12  . levothyroxine (SYNTHROID, LEVOTHROID) 137 MCG tablet Take 137 mcg by mouth daily before breakfast.    . Magnesium 250 MG TABS Take 1 tablet by mouth 2 (two) times daily.    . metoprolol tartrate (LOPRESSOR) 25 MG tablet Take 0.5 tablets (12.5 mg total) by mouth 2 (two) times daily. 90 tablet 3  . omeprazole (PRILOSEC) 20 MG capsule TAKE ONE CAPSULE BY MOUTH DAILY 90 capsule 3  . torsemide (DEMADEX) 20 MG tablet Take 60 mg by mouth 2 (two) times daily. 3 tabs,60 mg BID    . triamcinolone (NASACORT ALLERGY  24HR) 55 MCG/ACT AERO nasal inhaler Place 2 sprays into the nose daily.    . VOLTAREN 1 % GEL Apply 1 application topically daily as needed (for pain).   0   No current facility-administered medications on file prior to visit.     Social History   Social History  . Marital status: Widowed    Spouse name: N/A  . Number of children: N/A  . Years of education: N/A   Occupational History  . Not on file.   Social History Main Topics  . Smoking status: Former Smoker    Packs/day: 0.50    Years: 30.00    Types: Cigarettes    Start date: 09/24/1958    Quit date: 11/13/1999  . Smokeless tobacco: Never Used  . Alcohol use No  . Drug use: No  . Sexual activity: Not on file   Other  Topics Concern  . Not on file   Social History Narrative  . No narrative on file    Family History  Problem Relation Age of Onset  . Dementia Mother   . Lung cancer Father     smoked  . Hypertension Sister   . Diabetes Brother   . Obesity Sister   . Hypertension Sister   . Restless legs syndrome Sister   . Healthy Sister   . Lung cancer Maternal Uncle     BP (!) 144/64   Pulse 83   Temp 97.5 F (36.4 C)   Ht 5' 1.5" (1.562 m)   Wt 217 lb (98.4 kg)   BMI 40.34 kg/m      Objective:   Physical Exam  Constitutional: She is oriented to person, place, and time. She appears well-developed and well-nourished.  HENT:  Head: Normocephalic and atraumatic.  Eyes: Conjunctivae and EOM are normal. Pupils are equal, round, and reactive to light.  Neck: Normal range of motion. Neck supple.  Cardiovascular: Normal rate, regular rhythm and intact distal pulses.   Pulmonary/Chest: Effort normal.  Abdominal: Soft.  Musculoskeletal: She exhibits tenderness (Pain left wrist more over first compartment, slight swelling, ROM good, grip weak; left elbow with lateral ecchymosis, ROM 10 to 100; pronation full, supination full but tender.  NV intact.).  Neurological: She is alert and oriented to person, place, and time. She displays normal reflexes. No cranial nerve deficit. She exhibits normal muscle tone. Coordination normal.  Skin: Skin is warm and dry.  Psychiatric: She has a normal mood and affect. Her behavior is normal. Judgment and thought content normal.    X-rays with navicular view done of the left wrist, reported separately.      Assessment & Plan:   Encounter Diagnoses  Name Primary?  . Acute pain of left wrist Yes  . Left elbow pain    I have given her a cock-up splint with thumb extension for the left.    I will see her back on 07-24-16 with x-rays of the left wrist and left elbow.  Call if any problem.  Precautions discussed.  Electronically Signed Darreld Mclean, MD 10/19/201710:52 AM

## 2016-07-17 ENCOUNTER — Other Ambulatory Visit (INDEPENDENT_AMBULATORY_CARE_PROVIDER_SITE_OTHER): Payer: Self-pay | Admitting: *Deleted

## 2016-07-17 DIAGNOSIS — D508 Other iron deficiency anemias: Secondary | ICD-10-CM

## 2016-07-24 ENCOUNTER — Encounter: Payer: Self-pay | Admitting: Orthopaedic Surgery

## 2016-07-24 ENCOUNTER — Ambulatory Visit (INDEPENDENT_AMBULATORY_CARE_PROVIDER_SITE_OTHER): Payer: Medicare Other | Admitting: Orthopaedic Surgery

## 2016-07-24 VITALS — BP 165/75 | HR 77 | Temp 96.8°F | Ht 61.5 in | Wt 217.0 lb

## 2016-07-24 DIAGNOSIS — M25532 Pain in left wrist: Secondary | ICD-10-CM | POA: Diagnosis not present

## 2016-07-24 DIAGNOSIS — M25522 Pain in left elbow: Secondary | ICD-10-CM

## 2016-07-24 NOTE — Progress Notes (Signed)
Patient BJ:[id number]:Yolanda Hoy RegisterW Dames, female DOB:May 26, 1941, 75 y.o. AOZ:[id number]  Chief Complaint  Patient presents with  . Follow-up    left elbow pain    HPI  Yolanda [name] is a 75 y.o. female who has left elbow and wrist pain.  The wrist is improved.  Her elbow is still tender.  I will begin OT for her.  She has no new trauma, no paresthesias.  She has no swelling today. HPI  Body mass index is 40.34 kg/m.  ROS  Review of Systems  HENT: Negative for congestion.   Respiratory: Negative for cough and shortness of breath.   Cardiovascular: Negative for chest pain and leg swelling.  Endocrine: Positive for cold intolerance.  Musculoskeletal: Positive for arthralgias and joint swelling.  Allergic/Immunologic: Positive for environmental allergies.    Past Medical History:  Diagnosis Date  . Barrett esophagus   . Bronchitis   . Cellulitis   . CHF (congestive heart failure) (HCC)   . COPD (chronic obstructive pulmonary disease) (HCC)   . Diastolic heart failure (HCC)   . Essential hypertension   . GERD (gastroesophageal reflux disease)   . Hypothyroidism   . PAF (paroxysmal atrial fibrillation) (HCC)     Past Surgical History:  Procedure Laterality Date  . BREAST BIOPSY Right Sept, 2012  . CHOLECYSTECTOMY    . COLONOSCOPY    . UPPER GASTROINTESTINAL ENDOSCOPY      Family History  Problem Relation Age of Onset  . Dementia Mother   . Lung cancer Father     smoked  . Hypertension Sister   . Diabetes Brother   . Obesity Sister   . Hypertension Sister   . Restless legs syndrome Sister   . Healthy Sister   . Lung cancer Maternal Uncle     Social History Social History  Substance Use Topics  . Smoking status: Former Smoker    Packs/day: 0.50    Years: 30.00    Types: Cigarettes    Start date: 09/24/1958    Quit date: 11/13/1999  . Smokeless tobacco: Never Used  . Alcohol use No    Allergies  Allergen Reactions  . Ciprofloxacin Shortness Of Breath    REACTION:  sob,tachycardia  . Doxycycline Nausea Only    Also experienced diarrhea   . Fish Oil     Patient face drew to the side,Bells Palsey  . Guaifenesin Shortness Of Breath    REACTION: sob,tachycardia  . Sulfamethoxazole W/Trimethoprim 800-160 [Sulfamethoxazole-Trimethoprim] Nausea Only    Also lack of appetite   . Uloric [Febuxostat] Swelling    No urination  . Adhesive [Tape] Other (See Comments)    Causes blisters on skin  . Allopurinol Other (See Comments)    Couldn't urinate  . Tramadol     insomnia    Current Outpatient Prescriptions  Medication Sig Dispense Refill  . acetaminophen (TYLENOL) 500 MG tablet Take 500-1,000 mg by mouth as needed for mild pain.     Marland Kitchen. albuterol (PROVENTIL HFA;VENTOLIN HFA) 108 (90 Base) MCG/ACT inhaler Inhale 2 puffs into the lungs every 6 (six) hours as needed for wheezing or shortness of breath.    Marland Kitchen. apixaban (ELIQUIS) 5 MG TABS tablet Take 1 tablet (5 mg total) by mouth 2 (two) times daily. 60 tablet 5  . cetirizine (ZYRTEC) 10 MG tablet Take 10 mg by mouth daily.    Marland Kitchen. diltiazem (CARDIZEM CD) 120 MG 24 hr capsule Take 1 capsule (120 mg total) by mouth daily. 90 capsule 3  .  flintstones complete (FLINTSTONES) 60 MG chewable tablet Chew 3 tablets by mouth daily.    . Fluticasone Furoate-Vilanterol 200-25 MCG/INH AEPB Inhale 1 puff into the lungs daily.    Marland Kitchen. gabapentin (NEURONTIN) 300 MG capsule Take 300-600 mg by mouth at bedtime. restless leg syndrome    . levalbuterol (XOPENEX) 0.63 MG/3ML nebulizer solution Take 3 mLs (0.63 mg total) by nebulization every 6 (six) hours as needed for wheezing or shortness of breath. 90 mL 12  . levothyroxine (SYNTHROID, LEVOTHROID) 137 MCG tablet Take 137 mcg by mouth daily before breakfast.    . Magnesium 250 MG TABS Take 1 tablet by mouth 2 (two) times daily.    . metoprolol tartrate (LOPRESSOR) 25 MG tablet Take 0.5 tablets (12.5 mg total) by mouth 2 (two) times daily. 90 tablet 3  . omeprazole (PRILOSEC) 20 MG  capsule TAKE ONE CAPSULE BY MOUTH DAILY 90 capsule 3  . torsemide (DEMADEX) 20 MG tablet Take 60 mg by mouth 2 (two) times daily. 3 tabs,60 mg BID    . triamcinolone (NASACORT ALLERGY 24HR) 55 MCG/ACT AERO nasal inhaler Place 2 sprays into the nose daily.    . VOLTAREN 1 % GEL Apply 1 application topically daily as needed (for pain).   0   No current facility-administered medications for this visit.      Physical Exam  Blood pressure (!) 165/75, pulse 77, temperature (!) 96.8 F (36 C), height 5' 1.5" (1.562 m), weight 217 lb (98.4 kg).  Constitutional: overall normal hygiene, normal nutrition, well developed, normal grooming, normal body habitus. Assistive device:left wrist spint  Musculoskeletal: gait and station Limp none, muscle tone and strength are normal, no tremors or atrophy is present.  .  Neurological: coordination overall normal.  Deep tendon reflex/nerve stretch intact.  Sensation normal.  Cranial nerves II-XII intact.   Skin:   Normal overall no scars, lesions, ulcers or rashes. No psoriasis.  Psychiatric: Alert and oriented x 3.  Recent memory intact, remote memory unclear.  Normal mood and affect. Well groomed.  Good eye contact.  Cardiovascular: overall no swelling, no varicosities, no edema bilaterally, normal temperatures of the legs and arms, no clubbing, cyanosis and good capillary refill.  Lymphatic: palpation is normal.  Left wrist not painful today and with full motion. NV intact.  Left elbow tender, no effusion.  ROM 8 to 100 with tenderness.  No redness present.  The patient has been educated about the nature of the problem(s) and counseled on treatment options.  The patient appeared to understand what I have discussed and is in agreement with it.  Encounter Diagnoses  Name Primary?  . Left elbow pain Yes  . Acute pain of left wrist     PLAN Call if any problems.  Precautions discussed.  Continue current medications.   Return to clinic 3  weeks   Begin OT.  Electronically Signed Darreld McleanWayne Keynan Heffern, MD 10/31/20173:47 PM

## 2016-07-25 ENCOUNTER — Other Ambulatory Visit: Payer: Self-pay | Admitting: Radiology

## 2016-07-25 DIAGNOSIS — M25522 Pain in left elbow: Secondary | ICD-10-CM

## 2016-08-06 ENCOUNTER — Ambulatory Visit: Payer: Medicare Other | Admitting: Cardiovascular Disease

## 2016-08-13 ENCOUNTER — Other Ambulatory Visit (INDEPENDENT_AMBULATORY_CARE_PROVIDER_SITE_OTHER): Payer: Self-pay | Admitting: *Deleted

## 2016-08-13 ENCOUNTER — Encounter (INDEPENDENT_AMBULATORY_CARE_PROVIDER_SITE_OTHER): Payer: Self-pay | Admitting: *Deleted

## 2016-08-13 DIAGNOSIS — D508 Other iron deficiency anemias: Secondary | ICD-10-CM

## 2016-08-14 ENCOUNTER — Ambulatory Visit: Payer: Medicare Other | Admitting: Orthopaedic Surgery

## 2016-08-22 ENCOUNTER — Ambulatory Visit (INDEPENDENT_AMBULATORY_CARE_PROVIDER_SITE_OTHER): Payer: Medicare Other | Admitting: Orthopaedic Surgery

## 2016-08-22 ENCOUNTER — Encounter: Payer: Self-pay | Admitting: Orthopaedic Surgery

## 2016-08-22 VITALS — BP 144/66 | HR 87 | Temp 96.8°F | Wt 220.0 lb

## 2016-08-22 DIAGNOSIS — M25532 Pain in left wrist: Secondary | ICD-10-CM | POA: Diagnosis not present

## 2016-08-22 DIAGNOSIS — M25522 Pain in left elbow: Secondary | ICD-10-CM

## 2016-08-22 NOTE — Progress Notes (Signed)
Patient ZO:XWRUEA:Yolanda [name] RegisterW Hochstatter, female DOB:[birth year], 75 y.o. VWU:[id number]  Chief Complaint  Patient presents with  . Follow-up    left elbow, wrist and hand    HPI  Yolanda [name] is a 75 y.o. female who has had left elbow pain and left wrist pain.  She has been to OT and is much,much improved.  She has done her exercises.  She has just a little tenderness now.  She has only slight wrist swelling and no new trauma.  She has no numbness. HPI  Body mass index is 40.9 kg/m.  ROS  Review of Systems  HENT: Negative for congestion.   Respiratory: Negative for cough and shortness of breath.   Cardiovascular: Negative for chest pain and leg swelling.  Endocrine: Positive for cold intolerance.  Musculoskeletal: Positive for arthralgias and joint swelling.  Allergic/Immunologic: Positive for environmental allergies.    Past Medical History:  Diagnosis Date  . Barrett esophagus   . Bronchitis   . Cellulitis   . CHF (congestive heart failure) (HCC)   . COPD (chronic obstructive pulmonary disease) (HCC)   . Diastolic heart failure (HCC)   . Essential hypertension   . GERD (gastroesophageal reflux disease)   . Hypothyroidism   . PAF (paroxysmal atrial fibrillation) (HCC)     Past Surgical History:  Procedure Laterality Date  . BREAST BIOPSY Right Sept, 2012  . CHOLECYSTECTOMY    . COLONOSCOPY    . UPPER GASTROINTESTINAL ENDOSCOPY      Family History  Problem Relation Age of Onset  . Dementia Mother   . Lung cancer Father     smoked  . Hypertension Sister   . Diabetes Brother   . Obesity Sister   . Hypertension Sister   . Restless legs syndrome Sister   . Healthy Sister   . Lung cancer Maternal Uncle     Social History Social History  Substance Use Topics  . Smoking status: Former Smoker    Packs/day: 0.50    Years: 30.00    Types: Cigarettes    Start date: 09/24/1958    Quit date: 11/13/1999  . Smokeless tobacco: Never Used  . Alcohol use No    Allergies   Allergen Reactions  . Ciprofloxacin Shortness Of Breath    REACTION: sob,tachycardia  . Doxycycline Nausea Only    Also experienced diarrhea   . Fish Oil     Patient face drew to the side,Bells Palsey  . Guaifenesin Shortness Of Breath    REACTION: sob,tachycardia  . Sulfamethoxazole W/Trimethoprim 800-160 [Sulfamethoxazole-Trimethoprim] Nausea Only    Also lack of appetite   . Uloric [Febuxostat] Swelling    No urination  . Adhesive [Tape] Other (See Comments)    Causes blisters on skin  . Allopurinol Other (See Comments)    Couldn't urinate  . Tramadol     insomnia    Current Outpatient Prescriptions  Medication Sig Dispense Refill  . acetaminophen (TYLENOL) 500 MG tablet Take 500-1,000 mg by mouth as needed for mild pain.     Marland Kitchen. albuterol (PROVENTIL HFA;VENTOLIN HFA) 108 (90 Base) MCG/ACT inhaler Inhale 2 puffs into the lungs every 6 (six) hours as needed for wheezing or shortness of breath.    Marland Kitchen. apixaban (ELIQUIS) 5 MG TABS tablet Take 1 tablet (5 mg total) by mouth 2 (two) times daily. 60 tablet 5  . cetirizine (ZYRTEC) 10 MG tablet Take 10 mg by mouth daily.    Marland Kitchen. diltiazem (CARDIZEM CD) 120 MG 24 hr capsule Take  1 capsule (120 mg total) by mouth daily. 90 capsule 3  . flintstones complete (FLINTSTONES) 60 MG chewable tablet Chew 3 tablets by mouth daily.    . Fluticasone Furoate-Vilanterol 200-25 MCG/INH AEPB Inhale 1 puff into the lungs daily.    Marland Kitchen. gabapentin (NEURONTIN) 300 MG capsule Take 300-600 mg by mouth at bedtime. restless leg syndrome    . levalbuterol (XOPENEX) 0.63 MG/3ML nebulizer solution Take 3 mLs (0.63 mg total) by nebulization every 6 (six) hours as needed for wheezing or shortness of breath. 90 mL 12  . levothyroxine (SYNTHROID, LEVOTHROID) 137 MCG tablet Take 137 mcg by mouth daily before breakfast.    . Magnesium 250 MG TABS Take 1 tablet by mouth 2 (two) times daily.    . metoprolol tartrate (LOPRESSOR) 25 MG tablet Take 0.5 tablets (12.5 mg total) by  mouth 2 (two) times daily. 90 tablet 3  . omeprazole (PRILOSEC) 20 MG capsule TAKE ONE CAPSULE BY MOUTH DAILY 90 capsule 3  . torsemide (DEMADEX) 20 MG tablet Take 60 mg by mouth 2 (two) times daily. 3 tabs,60 mg BID    . triamcinolone (NASACORT ALLERGY 24HR) 55 MCG/ACT AERO nasal inhaler Place 2 sprays into the nose daily.    . VOLTAREN 1 % GEL Apply 1 application topically daily as needed (for pain).   0   No current facility-administered medications for this visit.      Physical Exam  Blood pressure (!) 144/66, pulse 87, temperature (!) 96.8 F (36 C), weight 220 lb (99.8 kg).  Constitutional: overall normal hygiene, normal nutrition, well developed, normal grooming, normal body habitus. Assistive device:none  Musculoskeletal: gait and station Limp none, muscle tone and strength are normal, no tremors or atrophy is present.  .  Neurological: coordination overall normal.  Deep tendon reflex/nerve stretch intact.  Sensation normal.  Cranial nerves II-XII intact.   Skin:   Normal overall no scars, lesions, ulcers or rashes. No psoriasis.  Psychiatric: Alert and oriented x 3.  Recent memory intact, remote memory unclear.  Normal mood and affect. Well groomed.  Good eye contact.  Cardiovascular: overall no swelling, no varicosities, no edema bilaterally, normal temperatures of the legs and arms, no clubbing, cyanosis and good capillary refill.  Lymphatic: palpation is normal.  Left wrist has full motion.  Left shoulder has full motion.  Left elbow has lack of about 3 to 4 degrees of full extension.  NV intact. Grips are good.  The patient has been educated about the nature of the problem(s) and counseled on treatment options.  The patient appeared to understand what I have discussed and is in agreement with it.  Encounter Diagnoses  Name Primary?  . Left elbow pain Yes  . Acute pain of left wrist     PLAN Call if any problems.  Precautions discussed.  Continue current  medications.   Return to clinic 6 weeks   She can stop OT.  Electronically Signed Darreld McleanWayne Stamatia Masri, MD 11/29/20172:45 PM

## 2016-08-22 NOTE — Patient Instructions (Signed)
Stop therapy and do exercises at home.

## 2016-08-24 ENCOUNTER — Encounter: Payer: Self-pay | Admitting: Orthopaedic Surgery

## 2016-08-31 ENCOUNTER — Encounter: Payer: Self-pay | Admitting: Cardiovascular Disease

## 2016-08-31 ENCOUNTER — Ambulatory Visit (INDEPENDENT_AMBULATORY_CARE_PROVIDER_SITE_OTHER): Payer: Medicare Other | Admitting: Cardiovascular Disease

## 2016-08-31 VITALS — BP 134/81 | HR 76 | Ht 61.0 in | Wt 227.0 lb

## 2016-08-31 DIAGNOSIS — I5033 Acute on chronic diastolic (congestive) heart failure: Secondary | ICD-10-CM

## 2016-08-31 DIAGNOSIS — I480 Paroxysmal atrial fibrillation: Secondary | ICD-10-CM | POA: Diagnosis not present

## 2016-08-31 DIAGNOSIS — I10 Essential (primary) hypertension: Secondary | ICD-10-CM | POA: Diagnosis not present

## 2016-08-31 DIAGNOSIS — Z7901 Long term (current) use of anticoagulants: Secondary | ICD-10-CM | POA: Diagnosis not present

## 2016-08-31 MED ORDER — METOLAZONE 5 MG PO TABS: ORAL_TABLET | ORAL | 0 refills | Status: DC

## 2016-08-31 NOTE — Progress Notes (Signed)
SUBJECTIVE: The patient presents follow-up of paroxysmal atrial fibrillation and chronic diastolic heart failure.  She fell on 07/08/16 due to losing her balance while working on her flowers.  She saw nephrology last week.  She was also recently diagnosed with a UTI. She has also developed left leg cellulitis due to leg swelling.  08/22/16: Hgb 11.1, plts 191  08/08/16: creatinine 1.44   Review of Systems: As per "subjective", otherwise negative.  Allergies  Allergen Reactions  . Ciprofloxacin Shortness Of Breath    REACTION: sob,tachycardia  . Doxycycline Nausea Only    Also experienced diarrhea   . Fish Oil     Patient face drew to the side,Bells Palsey  . Guaifenesin Shortness Of Breath    REACTION: sob,tachycardia  . Sulfamethoxazole W/Trimethoprim 800-160 [Sulfamethoxazole-Trimethoprim] Nausea Only    Also lack of appetite   . Uloric [Febuxostat] Swelling    No urination  . Adhesive [Tape] Other (See Comments)    Causes blisters on skin  . Allopurinol Other (See Comments)    Couldn't urinate  . Tramadol     insomnia    Current Outpatient Prescriptions  Medication Sig Dispense Refill  . acetaminophen (TYLENOL) 500 MG tablet Take 500-1,000 mg by mouth as needed for mild pain.     Marland Kitchen. albuterol (PROVENTIL HFA;VENTOLIN HFA) 108 (90 Base) MCG/ACT inhaler Inhale 2 puffs into the lungs every 6 (six) hours as needed for wheezing or shortness of breath.    Marland Kitchen. apixaban (ELIQUIS) 5 MG TABS tablet Take 1 tablet (5 mg total) by mouth 2 (two) times daily. 60 tablet 5  . cetirizine (ZYRTEC) 10 MG tablet Take 10 mg by mouth daily.    . clindamycin (CLEOCIN) 150 MG capsule Take 150 mg by mouth 4 (four) times daily.  1  . diltiazem (CARDIZEM CD) 120 MG 24 hr capsule Take 1 capsule (120 mg total) by mouth daily. 90 capsule 3  . flintstones complete (FLINTSTONES) 60 MG chewable tablet Chew 3 tablets by mouth daily.    . Fluticasone Furoate-Vilanterol 200-25 MCG/INH AEPB Inhale 1  puff into the lungs daily.    Marland Kitchen. gabapentin (NEURONTIN) 300 MG capsule Take 300-600 mg by mouth at bedtime. restless leg syndrome    . levalbuterol (XOPENEX) 0.63 MG/3ML nebulizer solution Take 3 mLs (0.63 mg total) by nebulization every 6 (six) hours as needed for wheezing or shortness of breath. 90 mL 12  . levothyroxine (SYNTHROID, LEVOTHROID) 137 MCG tablet Take 137 mcg by mouth daily before breakfast.    . Magnesium 250 MG TABS Take 1 tablet by mouth 2 (two) times daily.    . methylPREDNISolone (MEDROL DOSEPAK) 4 MG TBPK tablet Take 1 tablet by mouth daily as needed. For gout  2  . metoprolol tartrate (LOPRESSOR) 25 MG tablet Take 0.5 tablets (12.5 mg total) by mouth 2 (two) times daily. 90 tablet 3  . omeprazole (PRILOSEC) 20 MG capsule TAKE ONE CAPSULE BY MOUTH DAILY 90 capsule 3  . torsemide (DEMADEX) 20 MG tablet Take 60 mg by mouth 2 (two) times daily.     Marland Kitchen. triamcinolone (NASACORT ALLERGY 24HR) 55 MCG/ACT AERO nasal inhaler Place 2 sprays into the nose daily.    . VOLTAREN 1 % GEL Apply 1 application topically daily as needed (for pain).   0   No current facility-administered medications for this visit.     Past Medical History:  Diagnosis Date  . Barrett esophagus   . Bronchitis   . Cellulitis   .  CHF (congestive heart failure) (HCC)   . COPD (chronic obstructive pulmonary disease) (HCC)   . Diastolic heart failure (HCC)   . Essential hypertension   . GERD (gastroesophageal reflux disease)   . Hypothyroidism   . PAF (paroxysmal atrial fibrillation) (HCC)     Past Surgical History:  Procedure Laterality Date  . BREAST BIOPSY Right Sept, 2012  . CHOLECYSTECTOMY    . COLONOSCOPY    . UPPER GASTROINTESTINAL ENDOSCOPY      Social History   Social History  . Marital status: Widowed    Spouse name: N/A  . Number of children: N/A  . Years of education: N/A   Occupational History  . Not on file.   Social History Main Topics  . Smoking status: Former Smoker     Packs/day: 0.50    Years: 30.00    Types: Cigarettes    Start date: 09/24/1958    Quit date: 11/13/1999  . Smokeless tobacco: Never Used  . Alcohol use No  . Drug use: No  . Sexual activity: Not on file   Other Topics Concern  . Not on file   Social History Narrative  . No narrative on file     Vitals:   08/31/16 1425  BP: 134/81  Pulse: 76  SpO2: 94%  Weight: 227 lb (103 kg)  Height: 5\' 1"  (1.549 m)    PHYSICAL EXAM General: NAD HEENT: Normal. Neck: No JVD, no thyromegaly. Lungs: Clear to auscultation bilaterally with normal respiratory effort. CV: Nondisplaced PMI.  Regular rate and rhythm, normal S1/S2, no S3/S4, no murmur. 2+ left pretibial and periankle edema and 1+ on the right.  Abdomen: Soft, nontender, obese.  Neurologic: Alert and oriented.  Psych: Normal affect. Skin: Normal. Musculoskeletal: No gross deformities.   ECG: Most recent ECG reviewed.    ASSESSMENT AND PLAN: 1. Paroxysmal atrial fibrillation: Remains in a regular rhythm. Currently on long-acting diltiazem 120 mg daily, and tolerating Eliquis for anticoagulation without difficulties.   2. Acute on chronic diastolic heart failure: Now on torsemide 60 mg bid. Lower extremity swelling also due to chronic venous insufficiency due to obesity.  We'll give metolazone 5 mg for 3 days and check a basic metabolic panel in 3 days.  3. Essential hypertension: Controlled. No changes.  4. CKD stage III: Follows with nephrology.  Dispo: f/u 1 month   Prentice DockerSuresh Delilah Mulgrew, M.D., F.A.C.C.

## 2016-08-31 NOTE — Patient Instructions (Signed)
Your physician recommends that you schedule a follow-up appointment in: 1 MONTH WITH DR. Purvis SheffieldKONESWARAN   Your physician has recommended you make the following change in your medication:   START METOLAZONE 5 MG FOR 3 DAYS   Your physician recommends that you return for lab work ON Monday 09/03/16 BMP  Thank you for choosing [facility]!!

## 2016-09-02 ENCOUNTER — Inpatient Hospital Stay (HOSPITAL_COMMUNITY)
Admission: EM | Admit: 2016-09-02 | Discharge: 2016-09-06 | DRG: 602 | Disposition: A | Discharge status: Home / Self Care | Payer: Medicare Other | Attending: Pulmonary Disease | Admitting: Pulmonary Disease

## 2016-09-02 ENCOUNTER — Encounter (HOSPITAL_COMMUNITY): Payer: Self-pay | Admitting: Emergency Medicine

## 2016-09-02 ENCOUNTER — Emergency Department (HOSPITAL_COMMUNITY): Payer: Medicare Other

## 2016-09-02 DIAGNOSIS — E039 Hypothyroidism, unspecified: Secondary | ICD-10-CM | POA: Diagnosis present

## 2016-09-02 DIAGNOSIS — K219 Gastro-esophageal reflux disease without esophagitis: Secondary | ICD-10-CM | POA: Diagnosis present

## 2016-09-02 DIAGNOSIS — L03116 Cellulitis of left lower limb: Principal | ICD-10-CM

## 2016-09-02 DIAGNOSIS — J449 Chronic obstructive pulmonary disease, unspecified: Secondary | ICD-10-CM

## 2016-09-02 DIAGNOSIS — J441 Chronic obstructive pulmonary disease with (acute) exacerbation: Secondary | ICD-10-CM | POA: Diagnosis present

## 2016-09-02 DIAGNOSIS — Z882 Allergy status to sulfonamides status: Secondary | ICD-10-CM

## 2016-09-02 DIAGNOSIS — M7989 Other specified soft tissue disorders: Secondary | ICD-10-CM | POA: Diagnosis not present

## 2016-09-02 DIAGNOSIS — I480 Paroxysmal atrial fibrillation: Secondary | ICD-10-CM | POA: Diagnosis present

## 2016-09-02 DIAGNOSIS — Z91018 Allergy to other foods: Secondary | ICD-10-CM

## 2016-09-02 DIAGNOSIS — Z7901 Long term (current) use of anticoagulants: Secondary | ICD-10-CM

## 2016-09-02 DIAGNOSIS — I10 Essential (primary) hypertension: Secondary | ICD-10-CM | POA: Diagnosis present

## 2016-09-02 DIAGNOSIS — I5033 Acute on chronic diastolic (congestive) heart failure: Secondary | ICD-10-CM | POA: Diagnosis present

## 2016-09-02 DIAGNOSIS — M109 Gout, unspecified: Secondary | ICD-10-CM | POA: Diagnosis present

## 2016-09-02 DIAGNOSIS — K2270 Barrett's esophagus without dysplasia: Secondary | ICD-10-CM | POA: Diagnosis present

## 2016-09-02 DIAGNOSIS — N183 Chronic kidney disease, stage 3 (moderate): Secondary | ICD-10-CM | POA: Diagnosis present

## 2016-09-02 DIAGNOSIS — Z87891 Personal history of nicotine dependence: Secondary | ICD-10-CM

## 2016-09-02 DIAGNOSIS — R0902 Hypoxemia: Secondary | ICD-10-CM | POA: Diagnosis not present

## 2016-09-02 DIAGNOSIS — Z8249 Family history of ischemic heart disease and other diseases of the circulatory system: Secondary | ICD-10-CM

## 2016-09-02 DIAGNOSIS — Z885 Allergy status to narcotic agent status: Secondary | ICD-10-CM

## 2016-09-02 DIAGNOSIS — Z833 Family history of diabetes mellitus: Secondary | ICD-10-CM

## 2016-09-02 DIAGNOSIS — I4510 Unspecified right bundle-branch block: Secondary | ICD-10-CM | POA: Diagnosis present

## 2016-09-02 DIAGNOSIS — G4700 Insomnia, unspecified: Secondary | ICD-10-CM | POA: Diagnosis present

## 2016-09-02 DIAGNOSIS — Z881 Allergy status to other antibiotic agents status: Secondary | ICD-10-CM

## 2016-09-02 DIAGNOSIS — Z6841 Body Mass Index (BMI) 40.0 and over, adult: Secondary | ICD-10-CM

## 2016-09-02 DIAGNOSIS — Z9109 Other allergy status, other than to drugs and biological substances: Secondary | ICD-10-CM

## 2016-09-02 DIAGNOSIS — E669 Obesity, unspecified: Secondary | ICD-10-CM | POA: Diagnosis present

## 2016-09-02 DIAGNOSIS — I5032 Chronic diastolic (congestive) heart failure: Secondary | ICD-10-CM | POA: Diagnosis present

## 2016-09-02 DIAGNOSIS — Z888 Allergy status to other drugs, medicaments and biological substances status: Secondary | ICD-10-CM

## 2016-09-02 DIAGNOSIS — I130 Hypertensive heart and chronic kidney disease with heart failure and stage 1 through stage 4 chronic kidney disease, or unspecified chronic kidney disease: Secondary | ICD-10-CM | POA: Diagnosis present

## 2016-09-02 HISTORY — DX: Chronic kidney disease, stage 3 unspecified: N18.30

## 2016-09-02 HISTORY — DX: Chronic kidney disease, stage 3 (moderate): N18.3

## 2016-09-02 LAB — TROPONIN I

## 2016-09-02 LAB — COMPREHENSIVE METABOLIC PANEL
ALK PHOS: 77 U/L (ref 38–126)
ALT: 33 U/L (ref 14–54)
ANION GAP: 11 (ref 5–15)
AST: 43 U/L — ABNORMAL HIGH (ref 15–41)
Albumin: 3.7 g/dL (ref 3.5–5.0)
BILIRUBIN TOTAL: 0.7 mg/dL (ref 0.3–1.2)
BUN: 18 mg/dL (ref 6–20)
CALCIUM: 9.7 mg/dL (ref 8.9–10.3)
CO2: 31 mmol/L (ref 22–32)
CREATININE: 1.13 mg/dL — AB (ref 0.44–1.00)
Chloride: 92 mmol/L — ABNORMAL LOW (ref 101–111)
GFR, EST AFRICAN AMERICAN: 54 mL/min — AB (ref >60)
GFR, EST NON AFRICAN AMERICAN: 47 mL/min — AB (ref >60)
Glucose, Bld: 152 mg/dL — ABNORMAL HIGH (ref 65–99)
Potassium: 3.2 mmol/L — ABNORMAL LOW (ref 3.5–5.1)
SODIUM: 134 mmol/L — AB (ref 135–145)
TOTAL PROTEIN: 6.9 g/dL (ref 6.5–8.1)

## 2016-09-02 LAB — CBC WITH DIFFERENTIAL/PLATELET
Basophils Absolute: 0 10*3/uL (ref 0.0–0.1)
Basophils Relative: 0 %
EOS ABS: 0.2 10*3/uL (ref 0.0–0.7)
Eosinophils Relative: 2 %
HCT: 34.7 % — ABNORMAL LOW (ref 36.0–46.0)
HEMOGLOBIN: 11.7 g/dL — AB (ref 12.0–15.0)
LYMPHS ABS: 2.1 10*3/uL (ref 0.7–4.0)
LYMPHS PCT: 23 %
MCH: 31.7 pg (ref 26.0–34.0)
MCHC: 33.7 g/dL (ref 30.0–36.0)
MCV: 94 fL (ref 78.0–100.0)
MONOS PCT: 9 %
Monocytes Absolute: 0.8 10*3/uL (ref 0.1–1.0)
Neutro Abs: 6 10*3/uL (ref 1.7–7.7)
Neutrophils Relative %: 66 %
Platelets: 235 10*3/uL (ref 150–400)
RBC: 3.69 MIL/uL — ABNORMAL LOW (ref 3.87–5.11)
RDW: 17.3 % — ABNORMAL HIGH (ref 11.5–15.5)
WBC: 9.1 10*3/uL (ref 4.0–10.5)

## 2016-09-02 LAB — URINALYSIS, ROUTINE W REFLEX MICROSCOPIC
BILIRUBIN URINE: NEGATIVE
GLUCOSE, UA: NEGATIVE mg/dL
HGB URINE DIPSTICK: NEGATIVE
KETONES UR: NEGATIVE mg/dL
Leukocytes, UA: NEGATIVE
Nitrite: NEGATIVE
PH: 7 (ref 5.0–8.0)
Protein, ur: NEGATIVE mg/dL
SPECIFIC GRAVITY, URINE: 1.004 — AB (ref 1.005–1.030)

## 2016-09-02 LAB — LACTIC ACID, PLASMA
LACTIC ACID, VENOUS: 1.6 mmol/L (ref 0.5–1.9)
Lactic Acid, Venous: 2.4 mmol/L (ref 0.5–1.9)

## 2016-09-02 LAB — BRAIN NATRIURETIC PEPTIDE: B NATRIURETIC PEPTIDE 5: 29 pg/mL (ref 0.0–100.0)

## 2016-09-02 MED ORDER — GABAPENTIN 300 MG PO CAPS
300.0000 mg | ORAL_CAPSULE | Freq: Every evening | ORAL | Status: DC | PRN
  Administered 2016-09-02 – 2016-09-05 (×4): 600 mg via ORAL
  Filled 2016-09-02 (×4): qty 2

## 2016-09-02 MED ORDER — METOPROLOL TARTRATE 25 MG PO TABS
12.5000 mg | ORAL_TABLET | Freq: Two times a day (BID) | ORAL | Status: DC
  Administered 2016-09-02 – 2016-09-06 (×7): 12.5 mg via ORAL
  Filled 2016-09-02 (×8): qty 1

## 2016-09-02 MED ORDER — ALBUTEROL SULFATE (5 MG/ML) 0.5% IN NEBU: 2.5000 mg | INHALATION_SOLUTION | Freq: Four times a day (QID) | RESPIRATORY_TRACT | Status: DC

## 2016-09-02 MED ORDER — MAGNESIUM OXIDE 400 (241.3 MG) MG PO TABS
400.0000 mg | ORAL_TABLET | Freq: Every day | ORAL | Status: DC
  Administered 2016-09-03 – 2016-09-06 (×4): 400 mg via ORAL
  Filled 2016-09-02 (×5): qty 1

## 2016-09-02 MED ORDER — LEVOTHYROXINE SODIUM 25 MCG PO TABS
137.0000 ug | ORAL_TABLET | Freq: Every day | ORAL | Status: DC
  Administered 2016-09-03 – 2016-09-06 (×4): 137 ug via ORAL
  Filled 2016-09-02 (×4): qty 1

## 2016-09-02 MED ORDER — SODIUM CHLORIDE 0.9 % IV SOLN: 250.0000 mL | INTRAVENOUS | Status: DC | PRN

## 2016-09-02 MED ORDER — SODIUM CHLORIDE 0.9% FLUSH
3.0000 mL | Freq: Two times a day (BID) | INTRAVENOUS | Status: DC
  Administered 2016-09-02 – 2016-09-05 (×7): 3 mL via INTRAVENOUS

## 2016-09-02 MED ORDER — LORATADINE 10 MG PO TABS
10.0000 mg | ORAL_TABLET | Freq: Every day | ORAL | Status: DC
Start: 2016-09-03 — End: 2016-09-06
  Administered 2016-09-03 – 2016-09-06 (×4): 10 mg via ORAL
  Filled 2016-09-02 (×4): qty 1

## 2016-09-02 MED ORDER — HYDROCODONE-ACETAMINOPHEN 5-325 MG PO TABS
1.0000 | ORAL_TABLET | ORAL | Status: DC | PRN
  Administered 2016-09-04: 1 via ORAL
  Administered 2016-09-04: 2 via ORAL
  Filled 2016-09-02 (×2): qty 1
  Filled 2016-09-02: qty 2

## 2016-09-02 MED ORDER — POLYETHYLENE GLYCOL 3350 17 G PO PACK: 17.0000 g | PACK | Freq: Every day | ORAL | Status: DC | PRN

## 2016-09-02 MED ORDER — ONDANSETRON HCL 4 MG PO TABS: 4.0000 mg | ORAL_TABLET | Freq: Four times a day (QID) | ORAL | Status: DC | PRN

## 2016-09-02 MED ORDER — ALBUTEROL SULFATE (2.5 MG/3ML) 0.083% IN NEBU
2.5000 mg | INHALATION_SOLUTION | Freq: Once | RESPIRATORY_TRACT | Status: AC
  Administered 2016-09-02: 2.5 mg via RESPIRATORY_TRACT
  Filled 2016-09-02: qty 3

## 2016-09-02 MED ORDER — ACETAMINOPHEN 650 MG RE SUPP: 650.0000 mg | Freq: Four times a day (QID) | RECTAL | Status: DC | PRN

## 2016-09-02 MED ORDER — TRIAMCINOLONE ACETONIDE 55 MCG/ACT NA AERO: 2.0000 | INHALATION_SPRAY | Freq: Every day | NASAL | Status: DC

## 2016-09-02 MED ORDER — FLUTICASONE FUROATE-VILANTEROL 200-25 MCG/INH IN AEPB
1.0000 | INHALATION_SPRAY | Freq: Every day | RESPIRATORY_TRACT | Status: DC
  Administered 2016-09-03 – 2016-09-06 (×4): 1 via RESPIRATORY_TRACT
  Filled 2016-09-02: qty 28

## 2016-09-02 MED ORDER — LEVALBUTEROL HCL 0.63 MG/3ML IN NEBU
0.6300 mg | INHALATION_SOLUTION | Freq: Four times a day (QID) | RESPIRATORY_TRACT | Status: DC
  Administered 2016-09-03 (×3): 0.63 mg via RESPIRATORY_TRACT
  Filled 2016-09-02 (×3): qty 3

## 2016-09-02 MED ORDER — IPRATROPIUM-ALBUTEROL 0.5-2.5 (3) MG/3ML IN SOLN: 3.0000 mL | Freq: Four times a day (QID) | RESPIRATORY_TRACT | Status: DC

## 2016-09-02 MED ORDER — DILTIAZEM HCL ER COATED BEADS 120 MG PO CP24
120.0000 mg | ORAL_CAPSULE | Freq: Every day | ORAL | Status: DC
  Administered 2016-09-03 – 2016-09-06 (×4): 120 mg via ORAL
  Filled 2016-09-02 (×5): qty 1

## 2016-09-02 MED ORDER — PANTOPRAZOLE SODIUM 40 MG PO TBEC
40.0000 mg | DELAYED_RELEASE_TABLET | Freq: Every day | ORAL | Status: DC
  Administered 2016-09-03 – 2016-09-06 (×4): 40 mg via ORAL
  Filled 2016-09-02 (×5): qty 1

## 2016-09-02 MED ORDER — VANCOMYCIN HCL IN DEXTROSE 1-5 GM/200ML-% IV SOLN
1000.0000 mg | INTRAVENOUS | Status: AC
  Administered 2016-09-02: 1000 mg via INTRAVENOUS
  Filled 2016-09-02 (×2): qty 200

## 2016-09-02 MED ORDER — VANCOMYCIN HCL 10 G IV SOLR
1500.0000 mg | INTRAVENOUS | Status: DC
  Administered 2016-09-03 – 2016-09-04 (×3): 1500 mg via INTRAVENOUS
  Filled 2016-09-02 (×3): qty 1500

## 2016-09-02 MED ORDER — ACETAMINOPHEN 325 MG PO TABS
650.0000 mg | ORAL_TABLET | Freq: Four times a day (QID) | ORAL | Status: DC | PRN
  Administered 2016-09-02 – 2016-09-06 (×5): 650 mg via ORAL
  Filled 2016-09-02 (×5): qty 2

## 2016-09-02 MED ORDER — APIXABAN 5 MG PO TABS
5.0000 mg | ORAL_TABLET | Freq: Two times a day (BID) | ORAL | Status: DC
  Administered 2016-09-02 – 2016-09-06 (×8): 5 mg via ORAL
  Filled 2016-09-02 (×8): qty 1

## 2016-09-02 MED ORDER — IPRATROPIUM-ALBUTEROL 0.5-2.5 (3) MG/3ML IN SOLN
3.0000 mL | Freq: Once | RESPIRATORY_TRACT | Status: AC
  Administered 2016-09-02: 3 mL via RESPIRATORY_TRACT
  Filled 2016-09-02: qty 3

## 2016-09-02 MED ORDER — FLUTICASONE PROPIONATE 50 MCG/ACT NA SUSP
2.0000 | Freq: Every day | NASAL | Status: DC
  Administered 2016-09-03 – 2016-09-05 (×3): 2 via NASAL
  Filled 2016-09-02: qty 16

## 2016-09-02 MED ORDER — FUROSEMIDE 10 MG/ML IJ SOLN
80.0000 mg | Freq: Three times a day (TID) | INTRAMUSCULAR | Status: AC
  Administered 2016-09-03 – 2016-09-04 (×6): 80 mg via INTRAVENOUS
  Filled 2016-09-02 (×6): qty 8

## 2016-09-02 MED ORDER — ONDANSETRON HCL 4 MG/2ML IJ SOLN: 4.0000 mg | Freq: Four times a day (QID) | INTRAMUSCULAR | Status: DC | PRN

## 2016-09-02 NOTE — H&P (Signed)
[facility] History and Physical  URA YINGLING KVQ:[id number] DOB: 1941-02-23 DOA: 09/02/2016  Referring physician: Dr Thurnell Garbe PCP: Alonza Bogus, MD   Chief Complaint: Leg swelling and redness  HPI: Yolanda [name] is a 75 y.o. female with history of COPD, HTN, diast CHF, obesity, cellulitis , afib who presented to ED with c/o worsening LE edema and redness/ warmth/ pain in LLE.  In ED pt observed to have bright red L lower leg, CXR clear and no fever.  Has been getting OP abx with clindamycin.  Asked to see for admission.   Patient describes redness and pain in LLE started over a week ago, started on clindamycin 6 days ago. Not getting better. Eating ok, no chills or sweats, no n/v/d or abd pain. Did have problems with severe heartburn when taking clinda with zaroxlyn which was just recently prescribed for LE edema x 3 day course.  Says legs are more swollen than usual but not severe.  Mild DOE. No CP.     Chart review: 2016  - COPD exacerbation, HTN, neuropathy, hypoT4, chron diast CHF, ^K, PAF 2017  - chron diast CHF w ^'d leg swelling and LLE cellulitis, rx'd with IV lasix and abx and improved, low Na improved.   ROS  denies CP  no joint pain   no HA  no blurry vision  no diarrhea  no nausea/ vomiting  no dysuria  no difficulty voiding  no change in urine color    Past Medical History  Past Medical History:  Diagnosis Date  . Barrett esophagus   . Bronchitis   . Cellulitis   . CHF (congestive heart failure) ([place])   . COPD (chronic obstructive pulmonary disease) ([place])   . Diastolic heart failure ([place])   . Essential hypertension   . GERD (gastroesophageal reflux disease)   . Hypothyroidism   . PAF (paroxysmal atrial fibrillation) ([place])   . Stage III chronic kidney disease    Past Surgical History  Past Surgical History:  Procedure Laterality Date  . BREAST BIOPSY Right Sept, 2012  . CHOLECYSTECTOMY    . COLONOSCOPY    . UPPER GASTROINTESTINAL  ENDOSCOPY     Family History  Family History  Problem Relation Age of Onset  . Dementia Mother   . Lung cancer Father     smoked  . Hypertension Sister   . Diabetes Brother   . Obesity Sister   . Hypertension Sister   . Restless legs syndrome Sister   . Healthy Sister   . Lung cancer Maternal Uncle    Social History  reports that she quit smoking about 16 years ago. Her smoking use included Cigarettes. She started smoking about 57 years ago. She has a 15.00 pack-year smoking history. She has never used smokeless tobacco. She reports that she does not drink alcohol or use drugs. Allergies  Allergies  Allergen Reactions  . Ciprofloxacin Shortness Of Breath    REACTION: sob,tachycardia  . Doxycycline Nausea Only    Also experienced diarrhea   . Fish Oil     Patient face drew to the side,Bells Palsey  . Guaifenesin Shortness Of Breath    REACTION: sob,tachycardia  . Sulfamethoxazole W/Trimethoprim 800-160 [Sulfamethoxazole-Trimethoprim] Nausea Only    Also lack of appetite   . Uloric [Febuxostat] Swelling    No urination  . Adhesive [Tape] Other (See Comments)    Causes blisters on skin  . Allopurinol Other (See Comments)    Couldn't urinate  . Tramadol  insomnia   Home medications Prior to Admission medications   Medication Sig Start Date End Date Taking? Authorizing Provider  acetaminophen (TYLENOL) 500 MG tablet Take 500-1,000 mg by mouth as needed for mild pain.    Yes Historical Provider, MD  albuterol (PROVENTIL HFA;VENTOLIN HFA) 108 (90 Base) MCG/ACT inhaler Inhale 2 puffs into the lungs every 6 (six) hours as needed for wheezing or shortness of breath.   Yes Historical Provider, MD  apixaban (ELIQUIS) 5 MG TABS tablet Take 1 tablet (5 mg total) by mouth 2 (two) times daily. 11/13/14  Yes Sinda Du, MD  cetirizine (ZYRTEC) 10 MG tablet Take 10 mg by mouth daily.   Yes Historical Provider, MD  clindamycin (CLEOCIN) 150 MG capsule Take 150 mg by mouth 4 (four)  times daily. 08/28/16 09/07/16 Yes Historical Provider, MD  diltiazem (CARDIZEM CD) 120 MG 24 hr capsule Take 1 capsule (120 mg total) by mouth daily. 01/26/16  Yes Herminio Commons, MD  Fluticasone Furoate-Vilanterol 200-25 MCG/INH AEPB Inhale 1 puff into the lungs daily.   Yes Historical Provider, MD  gabapentin (NEURONTIN) 300 MG capsule Take 300-600 mg by mouth at bedtime as needed. restless leg syndrome   Yes Historical Provider, MD  levalbuterol (XOPENEX) 0.63 MG/3ML nebulizer solution Take 3 mLs (0.63 mg total) by nebulization every 6 (six) hours as needed for wheezing or shortness of breath. 11/13/14  Yes Sinda Du, MD  levothyroxine (SYNTHROID, LEVOTHROID) 137 MCG tablet Take 137 mcg by mouth daily before breakfast.   Yes Historical Provider, MD  Magnesium 250 MG TABS Take 2 tablets by mouth daily.    Yes Historical Provider, MD  methylPREDNISolone (MEDROL DOSEPAK) 4 MG TBPK tablet Take 1 tablet by mouth daily as needed. For gout 08/28/16  Yes Historical Provider, MD  metolazone (ZAROXOLYN) 5 MG tablet Take 1 tab daily for 3 days Patient taking differently: Take 5 mg by mouth daily. Take 1 tab daily for 3 days 08/31/16  Yes Herminio Commons, MD  metoprolol tartrate (LOPRESSOR) 25 MG tablet Take 0.5 tablets (12.5 mg total) by mouth 2 (two) times daily. 02/02/16  Yes Luke K Kilroy, PA-C  omeprazole (PRILOSEC) 20 MG capsule TAKE ONE CAPSULE BY MOUTH DAILY 05/29/16  Yes Rogene Houston, MD  torsemide (DEMADEX) 20 MG tablet Take 60 mg by mouth 2 (two) times daily.    Yes Historical Provider, MD  triamcinolone (NASACORT ALLERGY 24HR) 55 MCG/ACT AERO nasal inhaler Place 2 sprays into the nose daily.   Yes Historical Provider, MD  VOLTAREN 1 % GEL Apply 1 application topically daily as needed (for pain).  08/25/14  Yes Historical Provider, MD   Liver Function Tests  Recent Labs Lab 09/02/16 1539  AST 43*  ALT 33  ALKPHOS 77  BILITOT 0.7  PROT 6.9  ALBUMIN 3.7   No results for input(s):  LIPASE, AMYLASE in the last 168 hours. CBC  Recent Labs Lab 09/02/16 1539  WBC 9.1  NEUTROABS 6.0  HGB 11.7*  HCT 34.7*  MCV 94.0  PLT 263   Basic Metabolic Panel  Recent Labs Lab 09/02/16 1539  NA 134*  K 3.2*  CL 92*  CO2 31  GLUCOSE 152*  BUN 18  CREATININE 1.13*  CALCIUM 9.7     Vitals:   09/02/16 1528 09/02/16 1630 09/02/16 1700 09/02/16 1702  BP: 142/55 120/63 128/76   Pulse: 88 81 84   Resp: _0 Temp: 98 F (36.7 C)     TempSrc:  Oral     SpO2: 95% 94% 94% 96%  Weight: 99.8 kg (220 lb)     Height: _0  (1.549 m)      Exam: Gen pleasant, obese, cushingoid facies, no distress, calm No rash, cyanosis or gangrene Sclera anicteric, throat clear  No jvd or bruits Chest clear bilat w mildly dec'd air movement throughout RRR no MRG Abd soft ntnd no mass or ascites +bs obese GU defer MS no joint effusions or deformity Ext 1-2+ bilat pretib LE edema; L lower leg bright pinkish/ red erythema from below the knee to the ankle; no abscess or drainage, no ulceration Neuro is alert, Ox 3 , nf   Home meds: proventil, eliquis, Cleocin, Cardizem CD, steroid/ LABA, neurontin, synthroid, Mg, zaroxolyn, lopressor, prilosec, demadex 60 bid, nasacort, voltaren gel  Na 134  K 3.2  CO2 31  BYN 18 Cr 1.13  CA 9.7  eGFR 47  Glu 152  Alb 3.7  LFT's ok   Trop < 0.03  BNP 29  Lact acid 2.4 WBC 9k  Hb 11.7   UA neg   EKG (independ reviewed) > NSR 91 bpm , incomplete right bundle branch block; low voltage precordial leads CXR (independ reviewed) > no active disease   Assessment: 1. Cellulitis L lower ext - failing OP abx.  Plan admit, diurese, IV vanc.  2. LE edema , hx diast CHF - CXR clear. Hold po diuretics, give IV lasix 80 tid 3. COPD - cont meds, no exacerbation 4. HTN cont meds 5. AFib on eliquis/ dilt/ BB, continue 6. CKD 3 - stable   Plan - as above     [place] [facility] Pager [phone]   If 7PM-7AM, please contact  night-coverage [email or web address] Password [facility] 09/02/2016, 6:33 PM

## 2016-09-02 NOTE — ED Notes (Signed)
Pt states that she has been short of breath and feels like " a wrung out wet rag".  She states that she has had no energy for two weeks.  She has been on clindamycin and states that it is making her esophagus hurt.  She had to be switched to clindamycin due to the previous antibiotic making her feel worse for a UTI.

## 2016-09-02 NOTE — Progress Notes (Signed)
Pt lactic acid now 2.4 from 1.6. MD made aware and advised to continue with scheduled vancomycin. Will continue to monitor.

## 2016-09-02 NOTE — Progress Notes (Signed)
Pharmacy Antibiotic Note  Yolanda [name] is a 75 y.o. female admitted on 09/02/2016 with cellulitis.  Pharmacy has been consulted for vancomycin dosing.  Plan: Vanc 2gm IV x 1 then 1500 mg IV q24 hours F/u renal function, cultures and clinical course  Height: 5\' 1"  (154.9 cm) Weight: 220 lb (99.8 kg) IBW/kg (Calculated) : 47.8  Temp (24hrs), Avg:98 F (36.7 C), Min:98 F (36.7 C), Max:98 F (36.7 C)   Recent Labs Lab 09/02/16 1539  WBC 9.1  CREATININE 1.13*  LATICACIDVEN 1.6    Estimated Creatinine Clearance: 48 mL/min (by C-G formula based on SCr of 1.13 mg/dL (H)).    Allergies  Allergen Reactions  . Ciprofloxacin Shortness Of Breath    REACTION: sob,tachycardia  . Doxycycline Nausea Only    Also experienced diarrhea   . Fish Oil     Patient face drew to the side,Bells Palsey  . Guaifenesin Shortness Of Breath    REACTION: sob,tachycardia  . Sulfamethoxazole W/Trimethoprim 800-160 [Sulfamethoxazole-Trimethoprim] Nausea Only    Also lack of appetite   . Uloric [Febuxostat] Swelling    No urination  . Adhesive [Tape] Other (See Comments)    Causes blisters on skin  . Allopurinol Other (See Comments)    Couldn't urinate  . Tramadol     insomnia    Antimicrobials this admission: vanc 12/10 >>    Thank you for allowing pharmacy to be a part of this patient's care.  Talbert CageSeay, Loucinda Croy Poteet 09/02/2016 7:12 PM

## 2016-09-02 NOTE — ED Triage Notes (Signed)
PT c/o cellulitis in left lower leg after increased lower extremity edema and was started on an po antibiotic x6 days ago. PT also c/o SOB on exertion and states hx of CHF as well and c/o generalized weakness.

## 2016-09-02 NOTE — ED Provider Notes (Signed)
[facility]Y DEPT Provider Note   CSN: [id number] Arrival date & time: 09/02/16  1508     History   Chief Complaint Chief Complaint  Patient presents with  . Cellulitis    HPI Yolanda [name] is a 75 y.o. female.    Pt was seen at 1630. Per pt, c/o gradual onset and worsening of persistent LLE "redness" and "swelling" for the past 2 weeks. Has been associated with generalized fatigue/weakness and SOB, especially when walking. Pt was evaluated by her PMD 1 week ago for these complaints, rx clindamycin for cellulitis. Pt was evaluated by her Cards MD 2 days ago, rx metolazone for 3 days. Pt states she has been taking the medications as prescribed and her symptoms are not improving. Endorses compliance with Eliquis. Denies CP/palpitations, no cough, no abd pain, no N/V/D, no injury, no fevers, no focal motor weakness, no tingling/numbness in extremities.   Past Medical History:  Diagnosis Date  . Barrett esophagus   . Bronchitis   . Cellulitis   . CHF (congestive heart failure) (HCC)   . COPD (chronic obstructive pulmonary disease) (HCC)   . Diastolic heart failure (HCC)   . Essential hypertension   . GERD (gastroesophageal reflux disease)   . Hypothyroidism   . PAF (paroxysmal atrial fibrillation) (HCC)   . Stage III chronic kidney disease     Patient Active Problem List   Diagnosis Date Noted  . Chronic anticoagulation 02/02/2016  . Chronic edema 02/02/2016  . Obesity 02/02/2016  . Sleep apnea 02/02/2016  . RBBB 02/02/2016  . Acute on chronic diastolic heart failure (HCC) 01/13/2016  . Hyponatremia 01/06/2016  . AKI (acute kidney injury) (HCC) 01/06/2016  . Cellulitis 12/15/2014  . PAF (paroxysmal atrial fibrillation) (HCC) 11/09/2014  . COPD exacerbation (HCC) 11/06/2014  . Neuropathic pain 11/06/2014  . Hypothyroidism 11/06/2014  . Chronic diastolic CHF (congestive heart failure) (HCC) 11/06/2014  . Hyperkalemia 11/06/2014  . Pedal edema 01/12/2014  . HTN  (hypertension) 01/12/2014  . Upper airway cough syndrome 09/03/2013  . Barrett's esophagus 01/15/2013  . GERD (gastroesophageal reflux disease) 11/13/2011  . Short-segment Barrett's esophagus 11/13/2011  . COPD  II  01/08/2008    Past Surgical History:  Procedure Laterality Date  . BREAST BIOPSY Right Sept, 2012  . CHOLECYSTECTOMY    . COLONOSCOPY    . UPPER GASTROINTESTINAL ENDOSCOPY      OB History    Gravida Para Term Preterm AB Living             0   SAB TAB Ectopic Multiple Live Births                   Home Medications    Prior to Admission medications   Medication Sig Start Date End Date Taking? Authorizing Provider  acetaminophen (TYLENOL) 500 MG tablet Take 500-1,000 mg by mouth as needed for mild pain.     Historical Provider, MD  albuterol (PROVENTIL HFA;VENTOLIN HFA) 108 (90 Base) MCG/ACT inhaler Inhale 2 puffs into the lungs every 6 (six) hours as needed for wheezing or shortness of breath.    Historical Provider, MD  apixaban (ELIQUIS) 5 MG TABS tablet Take 1 tablet (5 mg total) by mouth 2 (two) times daily. 11/13/14   Kari BaarsEdward Hawkins, MD  cetirizine (ZYRTEC) 10 MG tablet Take 10 mg by mouth daily.    Historical Provider, MD  clindamycin (CLEOCIN) 150 MG capsule Take 150 mg by mouth 4 (four) times daily. 08/28/16 09/07/16  Historical Provider, MD  diltiazem (CARDIZEM CD) 120 MG 24 hr capsule Take 1 capsule (120 mg total) by mouth daily. 01/26/16   Laqueta Linden, MD  flintstones complete (FLINTSTONES) 60 MG chewable tablet Chew 3 tablets by mouth daily.    Historical Provider, MD  Fluticasone Furoate-Vilanterol 200-25 MCG/INH AEPB Inhale 1 puff into the lungs daily.    Historical Provider, MD  gabapentin (NEURONTIN) 300 MG capsule Take 300-600 mg by mouth at bedtime. restless leg syndrome    Historical Provider, MD  levalbuterol (XOPENEX) 0.63 MG/3ML nebulizer solution Take 3 mLs (0.63 mg total) by nebulization every 6 (six) hours as needed for wheezing or  shortness of breath. 11/13/14   Kari Baars, MD  levothyroxine (SYNTHROID, LEVOTHROID) 137 MCG tablet Take 137 mcg by mouth daily before breakfast.    Historical Provider, MD  Magnesium 250 MG TABS Take 1 tablet by mouth 2 (two) times daily.    Historical Provider, MD  methylPREDNISolone (MEDROL DOSEPAK) 4 MG TBPK tablet Take 1 tablet by mouth daily as needed. For gout 08/28/16   Historical Provider, MD  metolazone (ZAROXOLYN) 5 MG tablet Take 1 tab daily for 3 days 08/31/16   Laqueta Linden, MD  metoprolol tartrate (LOPRESSOR) 25 MG tablet Take 0.5 tablets (12.5 mg total) by mouth 2 (two) times daily. 02/02/16   Abelino Derrick, PA-C  omeprazole (PRILOSEC) 20 MG capsule TAKE ONE CAPSULE BY MOUTH DAILY 05/29/16   Malissa Hippo, MD  torsemide (DEMADEX) 20 MG tablet Take 60 mg by mouth 2 (two) times daily.     Historical Provider, MD  triamcinolone (NASACORT ALLERGY 24HR) 55 MCG/ACT AERO nasal inhaler Place 2 sprays into the nose daily.    Historical Provider, MD  VOLTAREN 1 % GEL Apply 1 application topically daily as needed (for pain).  08/25/14   Historical Provider, MD    Family History Family History  Problem Relation Age of Onset  . Dementia Mother   . Lung cancer Father     smoked  . Hypertension Sister   . Diabetes Brother   . Obesity Sister   . Hypertension Sister   . Restless legs syndrome Sister   . Healthy Sister   . Lung cancer Maternal Uncle     Social History Social History  Substance Use Topics  . Smoking status: Former Smoker    Packs/day: 0.50    Years: 30.00    Types: Cigarettes    Start date: 09/24/1958    Quit date: 11/13/1999  . Smokeless tobacco: Never Used  . Alcohol use No     Allergies   Ciprofloxacin; Doxycycline; Fish oil; Guaifenesin; Sulfamethoxazole w/trimethoprim 800-160 [sulfamethoxazole-trimethoprim]; Uloric [febuxostat]; Adhesive [tape]; Allopurinol; and Tramadol   Review of Systems Review of Systems ROS: Statement: All systems negative  except as marked or noted in the HPI; Constitutional: Negative for fever and chills. ; ; Eyes: Negative for eye pain, redness and discharge. ; ; ENMT: Negative for ear pain, hoarseness, nasal congestion, sinus pressure and sore throat. ; ; Cardiovascular: Negative for chest pain, palpitations, diaphoresis, dyspnea and +peripheral edema. ; ; Respiratory: Negative for cough, wheezing and stridor. ; ; Gastrointestinal: Negative for nausea, vomiting, diarrhea, abdominal pain, blood in stool, hematemesis, jaundice and rectal bleeding. . ; ; Genitourinary: Negative for dysuria, flank pain and hematuria. ; ; Musculoskeletal: Negative for back pain and neck pain. Negative for trauma.; ; Skin: +rash LLE. Negative for pruritus, abrasions, blisters, bruising and skin lesion.; ; Neuro: Negative for headache, lightheadedness and neck stiffness. Negative  for weakness, altered level of consciousness, altered mental status, extremity weakness, paresthesias, involuntary movement, seizure and syncope.      Physical Exam Updated Vital Signs BP 142/55 (BP Location: Right Arm)   Pulse 88   Temp 98 F (36.7 C) (Oral)   Resp 18   Ht 5\' 1"  (1.549 m)   Wt 220 lb (99.8 kg)   SpO2 95%   BMI 41.57 kg/m    16:55:58 Orthostatic Vital Signs JS  Orthostatic Lying   BP- Lying: 150/67  Pulse- Lying: 90      Orthostatic Sitting  BP- Sitting: 139/62  Pulse- Sitting: 90      Orthostatic Standing at 0 minutes  BP- Standing at 0 minutes: 130/61  Pulse- Standing at 0 minutes: 98     Physical Exam 1635: Physical examination:  Nursing notes reviewed; Vital signs and O2 SAT reviewed;  Constitutional: Well developed, Well nourished, Well hydrated, In no acute distress; Head:  Normocephalic, atraumatic; Eyes: EOMI, PERRL, No scleral icterus; ENMT: Mouth and pharynx normal, Mucous membranes moist; Neck: Supple, Full range of motion, No lymphadenopathy; Cardiovascular: Regular rate and rhythm, No gallop; Respiratory: Breath  sounds clear & equal bilaterally, No wheezes.  Speaking full sentences with ease, Normal respiratory effort/excursion; Chest: Nontender, Movement normal; Abdomen: Soft, Nontender, Nondistended, Normal bowel sounds; Genitourinary: No CVA tenderness; Extremities: Pulses normal, No tenderness, +LLE erythema from knee to foot, L>R LE edema, with calf asymmetry.; Neuro: AA&Ox3, Major CN grossly intact.  Speech clear. No gross focal motor or sensory deficits in extremities.; Skin: Color normal, Warm, Dry.   ED Treatments / Results  Labs (all labs ordered are listed, but only abnormal results are displayed)   EKG  EKG Interpretation  Date/Time:  Sunday September 02 2016 16:56:41 EST Ventricular Rate:  91 PR Interval:    QRS Duration: 116 QT Interval:  368 QTC Calculation: 453 R Axis:   -59 Text Interpretation:  Sinus rhythm Incomplete right bundle branch block Low voltage, precordial leads When compared with ECG of 01/15/2016 No significant change was found Confirmed by [facility]  MD, Nicholos JohnsKATHLEEN [phone]([id number]) on 09/02/2016 5:01:06 PM       Radiology   Procedures Procedures (including critical care time)  Medications Ordered in ED Medications - No data to display   Initial Impression / Assessment and Plan / ED Course  I have reviewed the triage vital signs and the nursing notes.  Pertinent labs & imaging results that were available during my care of the patient were reviewed by me and considered in my medical decision making (see chart for details).  MDM Reviewed: previous chart, nursing note and vitals Reviewed previous: labs and ECG Interpretation: labs, ECG and x-ray    Results for orders placed or performed during the hospital encounter of 09/02/16  CBC with Differential  Result Value Ref Range   WBC 9.1 4.0 - 10.5 K/uL   RBC 3.69 (L) 3.87 - 5.11 MIL/uL   Hemoglobin 11.7 (L) 12.0 - 15.0 g/dL   HCT 40.934.7 (L) 81.136.0 - 91.446.0 %   MCV 94.0 78.0 - 100.0 fL   MCH 31.7 26.0 - 34.0 pg   MCHC  33.7 30.0 - 36.0 g/dL   RDW 78.217.3 (H) 95.611.5 - 21.315.5 %   Platelets 235 150 - 400 K/uL   Neutrophils Relative % 66 %   Neutro Abs 6.0 1.7 - 7.7 K/uL   Lymphocytes Relative 23 %   Lymphs Abs 2.1 0.7 - 4.0 K/uL   Monocytes Relative 9 %  Monocytes Absolute 0.8 0.1 - 1.0 K/uL   Eosinophils Relative 2 %   Eosinophils Absolute 0.2 0.0 - 0.7 K/uL   Basophils Relative 0 %   Basophils Absolute 0.0 0.0 - 0.1 K/uL  Comprehensive metabolic panel  Result Value Ref Range   Sodium 134 (L) 135 - 145 mmol/L   Potassium 3.2 (L) 3.5 - 5.1 mmol/L   Chloride 92 (L) 101 - 111 mmol/L   CO2 31 22 - 32 mmol/L   Glucose, Bld 152 (H) 65 - 99 mg/dL   BUN 18 6 - 20 mg/dL   Creatinine, Ser 1.61 (H) 0.44 - 1.00 mg/dL   Calcium 9.7 8.9 - 09.6 mg/dL   Total Protein 6.9 6.5 - 8.1 g/dL   Albumin 3.7 3.5 - 5.0 g/dL   AST 43 (H) 15 - 41 U/L   ALT 33 14 - 54 U/L   Alkaline Phosphatase 77 38 - 126 U/L   Total Bilirubin 0.7 0.3 - 1.2 mg/dL   GFR calc non Af Amer 47 (L) >60 mL/min   GFR calc Af Amer 54 (L) >60 mL/min   Anion gap 11 5 - 15  Brain natriuretic peptide  Result Value Ref Range   B Natriuretic Peptide 29.0 0.0 - 100.0 pg/mL  Troponin I  Result Value Ref Range   Troponin I <0.03 <0.03 ng/mL  Lactic acid, plasma  Result Value Ref Range   Lactic Acid, Venous 1.6 0.5 - 1.9 mmol/L  Urinalysis, Routine w reflex microscopic  Result Value Ref Range   Color, Urine COLORLESS (A) YELLOW   APPearance CLEAR CLEAR   Specific Gravity, Urine 1.004 (L) 1.005 - 1.030   pH 7.0 5.0 - 8.0   Glucose, UA NEGATIVE NEGATIVE mg/dL   Hgb urine dipstick NEGATIVE NEGATIVE   Bilirubin Urine NEGATIVE NEGATIVE   Ketones, ur NEGATIVE NEGATIVE mg/dL   Protein, ur NEGATIVE NEGATIVE mg/dL   Nitrite NEGATIVE NEGATIVE   Leukocytes, UA NEGATIVE NEGATIVE   Dg Chest 2 View Result Date: 09/02/2016 CLINICAL DATA:  Weakness, dyspnea, cellulitis of the right leg with pain and swelling x6 days. EXAM: CHEST  2 VIEW COMPARISON:  01/15/2016  FINDINGS: The heart size and mediastinal contours are within normal limits. Both lungs are clear. The visualized skeletal structures are stable in appearance with degenerative change visualized along the thoracolumbar spine. No acute osseous abnormality. IMPRESSION: No active cardiopulmonary disease. Electronically Signed   By: Tollie Eth M.D.   On: 09/02/2016 17:38    1830:  Not orthostatic on VS. Short neb given with improvement in O2 Sats from 94 to 96% R/A. Pt already taken a course of clindamycin; will dose IV vancomycin. T/C to [facility] Dr. Arlean Hopping, case discussed, including:  HPI, pertinent PM/SHx, VS/PE, dx testing, ED course and treatment:  Agreeable to admit, requests to write temporary orders, obtain medical bed to Dr. Juanetta Gosling' service.   Final Clinical Impressions(s) / ED Diagnoses   Final diagnoses:  None    New Prescriptions New Prescriptions   No medications on file      Samuel Jester, DO 09/06/16 2052

## 2016-09-02 NOTE — ED Notes (Signed)
Report given to Herbert SetaHeather, RN Dept 300, all questions answered

## 2016-09-03 DIAGNOSIS — Z833 Family history of diabetes mellitus: Secondary | ICD-10-CM | POA: Diagnosis not present

## 2016-09-03 DIAGNOSIS — K219 Gastro-esophageal reflux disease without esophagitis: Secondary | ICD-10-CM | POA: Diagnosis present

## 2016-09-03 DIAGNOSIS — I480 Paroxysmal atrial fibrillation: Secondary | ICD-10-CM | POA: Diagnosis present

## 2016-09-03 DIAGNOSIS — R0902 Hypoxemia: Secondary | ICD-10-CM | POA: Diagnosis not present

## 2016-09-03 DIAGNOSIS — K2270 Barrett's esophagus without dysplasia: Secondary | ICD-10-CM | POA: Diagnosis present

## 2016-09-03 DIAGNOSIS — Z9109 Other allergy status, other than to drugs and biological substances: Secondary | ICD-10-CM | POA: Diagnosis not present

## 2016-09-03 DIAGNOSIS — Z8249 Family history of ischemic heart disease and other diseases of the circulatory system: Secondary | ICD-10-CM | POA: Diagnosis not present

## 2016-09-03 DIAGNOSIS — J441 Chronic obstructive pulmonary disease with (acute) exacerbation: Secondary | ICD-10-CM | POA: Diagnosis present

## 2016-09-03 DIAGNOSIS — Z885 Allergy status to narcotic agent status: Secondary | ICD-10-CM | POA: Diagnosis not present

## 2016-09-03 DIAGNOSIS — Z91018 Allergy to other foods: Secondary | ICD-10-CM | POA: Diagnosis not present

## 2016-09-03 DIAGNOSIS — I4510 Unspecified right bundle-branch block: Secondary | ICD-10-CM | POA: Diagnosis present

## 2016-09-03 DIAGNOSIS — Z881 Allergy status to other antibiotic agents status: Secondary | ICD-10-CM | POA: Diagnosis not present

## 2016-09-03 DIAGNOSIS — N183 Chronic kidney disease, stage 3 (moderate): Secondary | ICD-10-CM | POA: Diagnosis present

## 2016-09-03 DIAGNOSIS — Z6841 Body Mass Index (BMI) 40.0 and over, adult: Secondary | ICD-10-CM | POA: Diagnosis not present

## 2016-09-03 DIAGNOSIS — I130 Hypertensive heart and chronic kidney disease with heart failure and stage 1 through stage 4 chronic kidney disease, or unspecified chronic kidney disease: Secondary | ICD-10-CM | POA: Diagnosis present

## 2016-09-03 DIAGNOSIS — Z882 Allergy status to sulfonamides status: Secondary | ICD-10-CM | POA: Diagnosis not present

## 2016-09-03 DIAGNOSIS — Z87891 Personal history of nicotine dependence: Secondary | ICD-10-CM | POA: Diagnosis not present

## 2016-09-03 DIAGNOSIS — L03116 Cellulitis of left lower limb: Secondary | ICD-10-CM | POA: Diagnosis present

## 2016-09-03 DIAGNOSIS — M7989 Other specified soft tissue disorders: Secondary | ICD-10-CM | POA: Diagnosis present

## 2016-09-03 DIAGNOSIS — E039 Hypothyroidism, unspecified: Secondary | ICD-10-CM | POA: Diagnosis present

## 2016-09-03 DIAGNOSIS — I5033 Acute on chronic diastolic (congestive) heart failure: Secondary | ICD-10-CM | POA: Diagnosis present

## 2016-09-03 DIAGNOSIS — M109 Gout, unspecified: Secondary | ICD-10-CM | POA: Diagnosis present

## 2016-09-03 DIAGNOSIS — G4700 Insomnia, unspecified: Secondary | ICD-10-CM | POA: Diagnosis present

## 2016-09-03 DIAGNOSIS — Z888 Allergy status to other drugs, medicaments and biological substances status: Secondary | ICD-10-CM | POA: Diagnosis not present

## 2016-09-03 LAB — BASIC METABOLIC PANEL
Anion gap: 10 (ref 5–15)
BUN: 17 mg/dL (ref 6–20)
CALCIUM: 9.3 mg/dL (ref 8.9–10.3)
CO2: 33 mmol/L — AB (ref 22–32)
CREATININE: 1.13 mg/dL — AB (ref 0.44–1.00)
Chloride: 91 mmol/L — ABNORMAL LOW (ref 101–111)
GFR calc Af Amer: 54 mL/min — ABNORMAL LOW (ref >60)
GFR calc non Af Amer: 47 mL/min — ABNORMAL LOW (ref >60)
GLUCOSE: 135 mg/dL — AB (ref 65–99)
Potassium: 3.9 mmol/L (ref 3.5–5.1)
Sodium: 134 mmol/L — ABNORMAL LOW (ref 135–145)

## 2016-09-03 MED ORDER — LEVALBUTEROL HCL 0.63 MG/3ML IN NEBU
0.6300 mg | INHALATION_SOLUTION | Freq: Three times a day (TID) | RESPIRATORY_TRACT | Status: DC
  Administered 2016-09-04 – 2016-09-06 (×7): 0.63 mg via RESPIRATORY_TRACT
  Filled 2016-09-03 (×7): qty 3

## 2016-09-03 NOTE — Progress Notes (Signed)
Subjective: She was admitted with cellulitis of her left leg. She has multiple other medical problems including COPD hypertension diastolic heart failure obesity atrial fib. She started having problems with her leg several days prior to admission but it got much worse. She says this morning and feels a little bit better. She has less heat in the leg. She is on IV diuresis and IV antibiotics. No chest pain. She's not short of breath. No abdominal pain nausea or vomiting  Objective: Vital signs in last 24 hours: Temp:  [98 F (36.7 C)-98.5 F (36.9 C)] 98.5 F (36.9 C) (12/11 0517) Pulse Rate:  [81-108] 91 (12/11 0517) Resp:  [18-21] 18 (12/11 0517) BP: (119-142)/(44-76) 119/50 (12/11 0517) SpO2:  [94 %-96 %] 96 % (12/11 0517) Weight:  [98.7 kg (217 lb 8 oz)-99.8 kg (220 lb)] 98.7 kg (217 lb 8 oz) (12/10 2051) Weight change:  Last BM Date: 09/02/16  Intake/Output from previous day: 12/10 0701 - 12/11 0700 In: 243 [P.O.:240; I.V.:3] Out: 300 [Urine:300]  PHYSICAL EXAM General appearance: alert, cooperative, mild distress and morbidly obese Resp: clear to auscultation bilaterally Cardio: Her heart is regular. No gallop. She has bilateral 2+ edema GI: Abdomen soft with no masses. Bowel sounds are present Extremities: Bilateral edema. Her left lower extremity is markedly inflamed and erythematous The rest of her skin is warm and dry. Pupils reactive. Mucous membranes are moist.  Lab Results:  Results for orders placed or performed during the hospital encounter of 09/02/16 (from the past 48 hour(s))  CBC with Differential     Status: Abnormal   Collection Time: 09/02/16  3:39 PM  Result Value Ref Range   WBC 9.1 4.0 - 10.5 K/uL   RBC 3.69 (L) 3.87 - 5.11 MIL/uL   Hemoglobin 11.7 (L) 12.0 - 15.0 g/dL   HCT 34.7 (L) 36.0 - 46.0 %   MCV 94.0 78.0 - 100.0 fL   MCH 31.7 26.0 - 34.0 pg   MCHC 33.7 30.0 - 36.0 g/dL   RDW 17.3 (H) 11.5 - 15.5 %   Platelets 235 150 - 400 K/uL    Neutrophils Relative % 66 %   Neutro Abs 6.0 1.7 - 7.7 K/uL   Lymphocytes Relative 23 %   Lymphs Abs 2.1 0.7 - 4.0 K/uL   Monocytes Relative 9 %   Monocytes Absolute 0.8 0.1 - 1.0 K/uL   Eosinophils Relative 2 %   Eosinophils Absolute 0.2 0.0 - 0.7 K/uL   Basophils Relative 0 %   Basophils Absolute 0.0 0.0 - 0.1 K/uL  Comprehensive metabolic panel     Status: Abnormal   Collection Time: 09/02/16  3:39 PM  Result Value Ref Range   Sodium 134 (L) 135 - 145 mmol/L   Potassium 3.2 (L) 3.5 - 5.1 mmol/L   Chloride 92 (L) 101 - 111 mmol/L   CO2 31 22 - 32 mmol/L   Glucose, Bld 152 (H) 65 - 99 mg/dL   BUN 18 6 - 20 mg/dL   Creatinine, Ser 1.13 (H) 0.44 - 1.00 mg/dL   Calcium 9.7 8.9 - 10.3 mg/dL   Total Protein 6.9 6.5 - 8.1 g/dL   Albumin 3.7 3.5 - 5.0 g/dL   AST 43 (H) 15 - 41 U/L   ALT 33 14 - 54 U/L   Alkaline Phosphatase 77 38 - 126 U/L   Total Bilirubin 0.7 0.3 - 1.2 mg/dL   GFR calc non Af Amer 47 (L) >60 mL/min   GFR calc Af Wyvonnia Lora  54 (L) >60 mL/min    Comment: (NOTE) The eGFR has been calculated using the CKD EPI equation. This calculation has not been validated in all clinical situations. eGFR's persistently <60 mL/min signify possible Chronic Kidney Disease.    Anion gap 11 5 - 15  Brain natriuretic peptide     Status: None   Collection Time: 09/02/16  3:39 PM  Result Value Ref Range   B Natriuretic Peptide 29.0 0.0 - 100.0 pg/mL  Troponin I     Status: None   Collection Time: 09/02/16  3:39 PM  Result Value Ref Range   Troponin I <0.03 <0.03 ng/mL  Lactic acid, plasma     Status: None   Collection Time: 09/02/16  3:39 PM  Result Value Ref Range   Lactic Acid, Venous 1.6 0.5 - 1.9 mmol/L  Urinalysis, Routine w reflex microscopic     Status: Abnormal   Collection Time: 09/02/16  5:07 PM  Result Value Ref Range   Color, Urine COLORLESS (A) YELLOW   APPearance CLEAR CLEAR   Specific Gravity, Urine 1.004 (L) 1.005 - 1.030   pH 7.0 5.0 - 8.0   Glucose, UA NEGATIVE  NEGATIVE mg/dL   Hgb urine dipstick NEGATIVE NEGATIVE   Bilirubin Urine NEGATIVE NEGATIVE   Ketones, ur NEGATIVE NEGATIVE mg/dL   Protein, ur NEGATIVE NEGATIVE mg/dL   Nitrite NEGATIVE NEGATIVE   Leukocytes, UA NEGATIVE NEGATIVE  Lactic acid, plasma     Status: Abnormal   Collection Time: 09/02/16  7:33 PM  Result Value Ref Range   Lactic Acid, Venous 2.4 (HH) 0.5 - 1.9 mmol/L    Comment: CRITICAL RESULT CALLED TO, READ BACK BY AND VERIFIED WITH: JOHNSON,B. AT 2018 ON 09/02/2016 BY EVA   Basic metabolic panel     Status: Abnormal   Collection Time: 09/03/16  5:24 AM  Result Value Ref Range   Sodium 134 (L) 135 - 145 mmol/L   Potassium 3.9 3.5 - 5.1 mmol/L    Comment: DELTA CHECK NOTED   Chloride 91 (L) 101 - 111 mmol/L   CO2 33 (H) 22 - 32 mmol/L   Glucose, Bld 135 (H) 65 - 99 mg/dL   BUN 17 6 - 20 mg/dL   Creatinine, Ser 1.13 (H) 0.44 - 1.00 mg/dL   Calcium 9.3 8.9 - 10.3 mg/dL   GFR calc non Af Amer 47 (L) >60 mL/min   GFR calc Af Amer 54 (L) >60 mL/min    Comment: (NOTE) The eGFR has been calculated using the CKD EPI equation. This calculation has not been validated in all clinical situations. eGFR's persistently <60 mL/min signify possible Chronic Kidney Disease.    Anion gap 10 5 - 15    ABGS No results for input(s): PHART, PO2ART, TCO2, HCO3 in the last 72 hours.  Invalid input(s): PCO2 CULTURES No results found for this or any previous visit (from the past 240 hour(s)). Studies/Results: Dg Chest 2 View  Result Date: 09/02/2016 CLINICAL DATA:  Weakness, dyspnea, cellulitis of the right leg with pain and swelling x6 days. EXAM: CHEST  2 VIEW COMPARISON:  01/15/2016 FINDINGS: The heart size and mediastinal contours are within normal limits. Both lungs are clear. The visualized skeletal structures are stable in appearance with degenerative change visualized along the thoracolumbar spine. No acute osseous abnormality. IMPRESSION: No active cardiopulmonary disease.  Electronically Signed   By: Ashley Royalty M.D.   On: 09/02/2016 17:38    Medications:  Prior to Admission:  Prescriptions Prior to Admission  Medication Sig Dispense Refill Last Dose  . acetaminophen (TYLENOL) 500 MG tablet Take 500-1,000 mg by mouth as needed for mild pain.    09/01/2016 at Unknown time  . albuterol (PROVENTIL HFA;VENTOLIN HFA) 108 (90 Base) MCG/ACT inhaler Inhale 2 puffs into the lungs every 6 (six) hours as needed for wheezing or shortness of breath.   09/02/2016 at Unknown time  . apixaban (ELIQUIS) 5 MG TABS tablet Take 1 tablet (5 mg total) by mouth 2 (two) times daily. 60 tablet 5 09/02/2016 at [employer]  . cetirizine (ZYRTEC) 10 MG tablet Take 10 mg by mouth daily.   09/02/2016 at Unknown time  . clindamycin (CLEOCIN) 150 MG capsule Take 150 mg by mouth 4 (four) times daily.  1 09/01/2016 at Unknown time  . diltiazem (CARDIZEM CD) 120 MG 24 hr capsule Take 1 capsule (120 mg total) by mouth daily. 90 capsule 3 09/02/2016 at Unknown time  . Fluticasone Furoate-Vilanterol 200-25 MCG/INH AEPB Inhale 1 puff into the lungs daily.   09/02/2016 at Unknown time  . gabapentin (NEURONTIN) 300 MG capsule Take 300-600 mg by mouth at bedtime as needed. restless leg syndrome   Past Week at Unknown time  . levalbuterol (XOPENEX) 0.63 MG/3ML nebulizer solution Take 3 mLs (0.63 mg total) by nebulization every 6 (six) hours as needed for wheezing or shortness of breath. 90 mL 12 Past Week at Unknown time  . levothyroxine (SYNTHROID, LEVOTHROID) 137 MCG tablet Take 137 mcg by mouth daily before breakfast.   09/02/2016 at Unknown time  . Magnesium 250 MG TABS Take 2 tablets by mouth daily.    09/02/2016 at Unknown time  . methylPREDNISolone (MEDROL DOSEPAK) 4 MG TBPK tablet Take 1 tablet by mouth daily as needed. For gout  2 unknown  . metolazone (ZAROXOLYN) 5 MG tablet Take 1 tab daily for 3 days (Patient taking differently: Take 5 mg by mouth daily. Take 1 tab daily for 3 days) 3 tablet 0 09/01/2016  at Unknown time  . metoprolol tartrate (LOPRESSOR) 25 MG tablet Take 0.5 tablets (12.5 mg total) by mouth 2 (two) times daily. 90 tablet 3 09/02/2016 at 900a  . omeprazole (PRILOSEC) 20 MG capsule TAKE ONE CAPSULE BY MOUTH DAILY 90 capsule 3 09/02/2016 at Unknown time  . torsemide (DEMADEX) 20 MG tablet Take 60 mg by mouth 2 (two) times daily.    09/02/2016 at 900atime  . triamcinolone (NASACORT ALLERGY 24HR) 55 MCG/ACT AERO nasal inhaler Place 2 sprays into the nose daily.   UNKNOWN  . VOLTAREN 1 % GEL Apply 1 application topically daily as needed (for pain).   0 Past Week at Unknown time   Scheduled: . apixaban  5 mg Oral BID  . diltiazem  120 mg Oral Daily  . fluticasone  2 spray Each Nare Daily  . fluticasone furoate-vilanterol  1 puff Inhalation Daily  . furosemide  80 mg Intravenous Q8H  . levalbuterol  0.63 mg Nebulization Q6H  . levothyroxine  137 mcg Oral QAC breakfast  . loratadine  10 mg Oral Daily  . magnesium oxide  400 mg Oral Daily  . metoprolol tartrate  12.5 mg Oral BID  . pantoprazole  40 mg Oral Daily  . sodium chloride flush  3 mL Intravenous Q12H  . vancomycin  1,500 mg Intravenous Q24H   Continuous:  HEN:IDPOEU chloride, acetaminophen **OR** acetaminophen, gabapentin, HYDROcodone-acetaminophen, ondansetron **OR** ondansetron (ZOFRAN) IV, polyethylene glycol  Assesment: She has cellulitis of her left leg. She has acute on chronic diastolic heart  failure. She is improved from yesterday. She is not ready for discharge. Her lactic acid level was up yesterday but she does not appear to be septic now. Principal Problem:   Cellulitis of left lower extremity Active Problems:   COPD  II    HTN (hypertension)   PAF (paroxysmal atrial fibrillation) (HCC)   Acute on chronic diastolic heart failure (HCC)   Chronic anticoagulation   Obesity    Plan: Continue current treatments. Continue IV antibiotics and IV Lasix. Full admission.    LOS: 0 days   Yolanda [name]  L 09/03/2016, 8:34 AM

## 2016-09-04 NOTE — Progress Notes (Signed)
Subjective: She says she feels better. She's had some trouble with IV access. Her leg is sore but not as painful as it was. She has no other new complaints. Her breathing is doing okay. No chest pain. No nausea vomiting diarrhea. She still has some swelling but it's better  Objective: Vital signs in last 24 hours: Temp:  [98.2 F (36.8 C)-98.7 F (37.1 C)] 98.2 F (36.8 C) (12/12 0515) Pulse Rate:  [66-92] 81 (12/12 0515) Resp:  [18-20] 18 (12/12 0515) BP: (102-124)/(35-53) 102/53 (12/12 0515) SpO2:  [92 %-95 %] 92 % (12/12 0515) Weight:  [98.6 kg (217 lb 4.8 oz)-98.7 kg (217 lb 11.2 oz)] 98.7 kg (217 lb 11.2 oz) (12/12 0515) Weight change: -1.225 kg (-2 lb 11.2 oz) Last BM Date: 09/02/16  Intake/Output from previous day: 12/11 0701 - 12/12 0700 In: 6295 [P.O.:720; I.V.:10; IV Piggyback:1000] Out: 3000 [Urine:3000]  PHYSICAL EXAM General appearance: alert, cooperative, mild distress and morbidly obese Resp: clear to auscultation bilaterally Cardio: regular rate and rhythm, S1, S2 normal, no murmur, click, rub or gallop GI: soft, non-tender; bowel sounds normal; no masses,  no organomegaly Extremities: She still has significant erythema of her left leg but it is better than yesterday. The edema has improved. Pupils react. Mucous membranes are moist.  Lab Results:  Results for orders placed or performed during the hospital encounter of 09/02/16 (from the past 48 hour(s))  CBC with Differential     Status: Abnormal   Collection Time: 09/02/16  3:39 PM  Result Value Ref Range   WBC 9.1 4.0 - 10.5 K/uL   RBC 3.69 (L) 3.87 - 5.11 MIL/uL   Hemoglobin 11.7 (L) 12.0 - 15.0 g/dL   HCT 34.7 (L) 36.0 - 46.0 %   MCV 94.0 78.0 - 100.0 fL   MCH 31.7 26.0 - 34.0 pg   MCHC 33.7 30.0 - 36.0 g/dL   RDW 17.3 (H) 11.5 - 15.5 %   Platelets 235 150 - 400 K/uL   Neutrophils Relative % 66 %   Neutro Abs 6.0 1.7 - 7.7 K/uL   Lymphocytes Relative 23 %   Lymphs Abs 2.1 0.7 - 4.0 K/uL   Monocytes  Relative 9 %   Monocytes Absolute 0.8 0.1 - 1.0 K/uL   Eosinophils Relative 2 %   Eosinophils Absolute 0.2 0.0 - 0.7 K/uL   Basophils Relative 0 %   Basophils Absolute 0.0 0.0 - 0.1 K/uL  Comprehensive metabolic panel     Status: Abnormal   Collection Time: 09/02/16  3:39 PM  Result Value Ref Range   Sodium 134 (L) 135 - 145 mmol/L   Potassium 3.2 (L) 3.5 - 5.1 mmol/L   Chloride 92 (L) 101 - 111 mmol/L   CO2 31 22 - 32 mmol/L   Glucose, Bld 152 (H) 65 - 99 mg/dL   BUN 18 6 - 20 mg/dL   Creatinine, Ser 1.13 (H) 0.44 - 1.00 mg/dL   Calcium 9.7 8.9 - 10.3 mg/dL   Total Protein 6.9 6.5 - 8.1 g/dL   Albumin 3.7 3.5 - 5.0 g/dL   AST 43 (H) 15 - 41 U/L   ALT 33 14 - 54 U/L   Alkaline Phosphatase 77 38 - 126 U/L   Total Bilirubin 0.7 0.3 - 1.2 mg/dL   GFR calc non Af Amer 47 (L) >60 mL/min   GFR calc Af Amer 54 (L) >60 mL/min    Comment: (NOTE) The eGFR has been calculated using the CKD EPI equation. This  calculation has not been validated in all clinical situations. eGFR's persistently <60 mL/min signify possible Chronic Kidney Disease.    Anion gap 11 5 - 15  Brain natriuretic peptide     Status: None   Collection Time: 09/02/16  3:39 PM  Result Value Ref Range   B Natriuretic Peptide 29.0 0.0 - 100.0 pg/mL  Troponin I     Status: None   Collection Time: 09/02/16  3:39 PM  Result Value Ref Range   Troponin I <0.03 <0.03 ng/mL  Lactic acid, plasma     Status: None   Collection Time: 09/02/16  3:39 PM  Result Value Ref Range   Lactic Acid, Venous 1.6 0.5 - 1.9 mmol/L  Urinalysis, Routine w reflex microscopic     Status: Abnormal   Collection Time: 09/02/16  5:07 PM  Result Value Ref Range   Color, Urine COLORLESS (A) YELLOW   APPearance CLEAR CLEAR   Specific Gravity, Urine 1.004 (L) 1.005 - 1.030   pH 7.0 5.0 - 8.0   Glucose, UA NEGATIVE NEGATIVE mg/dL   Hgb urine dipstick NEGATIVE NEGATIVE   Bilirubin Urine NEGATIVE NEGATIVE   Ketones, ur NEGATIVE NEGATIVE mg/dL    Protein, ur NEGATIVE NEGATIVE mg/dL   Nitrite NEGATIVE NEGATIVE   Leukocytes, UA NEGATIVE NEGATIVE  Lactic acid, plasma     Status: Abnormal   Collection Time: 09/02/16  7:33 PM  Result Value Ref Range   Lactic Acid, Venous 2.4 (HH) 0.5 - 1.9 mmol/L    Comment: CRITICAL RESULT CALLED TO, READ BACK BY AND VERIFIED WITH: JOHNSON,B. AT 2018 ON 09/02/2016 BY EVA   Basic metabolic panel     Status: Abnormal   Collection Time: 09/03/16  5:24 AM  Result Value Ref Range   Sodium 134 (L) 135 - 145 mmol/L   Potassium 3.9 3.5 - 5.1 mmol/L    Comment: DELTA CHECK NOTED   Chloride 91 (L) 101 - 111 mmol/L   CO2 33 (H) 22 - 32 mmol/L   Glucose, Bld 135 (H) 65 - 99 mg/dL   BUN 17 6 - 20 mg/dL   Creatinine, Ser 1.13 (H) 0.44 - 1.00 mg/dL   Calcium 9.3 8.9 - 10.3 mg/dL   GFR calc non Af Amer 47 (L) >60 mL/min   GFR calc Af Amer 54 (L) >60 mL/min    Comment: (NOTE) The eGFR has been calculated using the CKD EPI equation. This calculation has not been validated in all clinical situations. eGFR's persistently <60 mL/min signify possible Chronic Kidney Disease.    Anion gap 10 5 - 15    ABGS No results for input(s): PHART, PO2ART, TCO2, HCO3 in the last 72 hours.  Invalid input(s): PCO2 CULTURES No results found for this or any previous visit (from the past 240 hour(s)). Studies/Results: Dg Chest 2 View  Result Date: 09/02/2016 CLINICAL DATA:  Weakness, dyspnea, cellulitis of the right leg with pain and swelling x6 days. EXAM: CHEST  2 VIEW COMPARISON:  01/15/2016 FINDINGS: The heart size and mediastinal contours are within normal limits. Both lungs are clear. The visualized skeletal structures are stable in appearance with degenerative change visualized along the thoracolumbar spine. No acute osseous abnormality. IMPRESSION: No active cardiopulmonary disease. Electronically Signed   By: Ashley Royalty M.D.   On: 09/02/2016 17:38    Medications:  Prior to Admission:  Prescriptions Prior to  Admission  Medication Sig Dispense Refill Last Dose  . acetaminophen (TYLENOL) 500 MG tablet Take 500-1,000 mg by mouth as  needed for mild pain.    09/01/2016 at Unknown time  . albuterol (PROVENTIL HFA;VENTOLIN HFA) 108 (90 Base) MCG/ACT inhaler Inhale 2 puffs into the lungs every 6 (six) hours as needed for wheezing or shortness of breath.   09/02/2016 at Unknown time  . apixaban (ELIQUIS) 5 MG TABS tablet Take 1 tablet (5 mg total) by mouth 2 (two) times daily. 60 tablet 5 09/02/2016 at [place]  . cetirizine (ZYRTEC) 10 MG tablet Take 10 mg by mouth daily.   09/02/2016 at Unknown time  . clindamycin (CLEOCIN) 150 MG capsule Take 150 mg by mouth 4 (four) times daily.  1 09/01/2016 at Unknown time  . diltiazem (CARDIZEM CD) 120 MG 24 hr capsule Take 1 capsule (120 mg total) by mouth daily. 90 capsule 3 09/02/2016 at Unknown time  . Fluticasone Furoate-Vilanterol 200-25 MCG/INH AEPB Inhale 1 puff into the lungs daily.   09/02/2016 at Unknown time  . gabapentin (NEURONTIN) 300 MG capsule Take 300-600 mg by mouth at bedtime as needed. restless leg syndrome   Past Week at Unknown time  . levalbuterol (XOPENEX) 0.63 MG/3ML nebulizer solution Take 3 mLs (0.63 mg total) by nebulization every 6 (six) hours as needed for wheezing or shortness of breath. 90 mL 12 Past Week at Unknown time  . levothyroxine (SYNTHROID, LEVOTHROID) 137 MCG tablet Take 137 mcg by mouth daily before breakfast.   09/02/2016 at Unknown time  . Magnesium 250 MG TABS Take 2 tablets by mouth daily.    09/02/2016 at Unknown time  . methylPREDNISolone (MEDROL DOSEPAK) 4 MG TBPK tablet Take 1 tablet by mouth daily as needed. For gout  2 unknown  . metolazone (ZAROXOLYN) 5 MG tablet Take 1 tab daily for 3 days (Patient taking differently: Take 5 mg by mouth daily. Take 1 tab daily for 3 days) 3 tablet 0 09/01/2016 at Unknown time  . metoprolol tartrate (LOPRESSOR) 25 MG tablet Take 0.5 tablets (12.5 mg total) by mouth 2 (two) times daily. 90  tablet 3 09/02/2016 at 900a  . omeprazole (PRILOSEC) 20 MG capsule TAKE ONE CAPSULE BY MOUTH DAILY 90 capsule 3 09/02/2016 at Unknown time  . torsemide (DEMADEX) 20 MG tablet Take 60 mg by mouth 2 (two) times daily.    09/02/2016 at 900atime  . triamcinolone (NASACORT ALLERGY 24HR) 55 MCG/ACT AERO nasal inhaler Place 2 sprays into the nose daily.   UNKNOWN  . VOLTAREN 1 % GEL Apply 1 application topically daily as needed (for pain).   0 Past Week at Unknown time   Scheduled: . apixaban  5 mg Oral BID  . diltiazem  120 mg Oral Daily  . fluticasone  2 spray Each Nare Daily  . fluticasone furoate-vilanterol  1 puff Inhalation Daily  . furosemide  80 mg Intravenous Q8H  . levalbuterol  0.63 mg Nebulization TID  . levothyroxine  137 mcg Oral QAC breakfast  . loratadine  10 mg Oral Daily  . magnesium oxide  400 mg Oral Daily  . metoprolol tartrate  12.5 mg Oral BID  . pantoprazole  40 mg Oral Daily  . sodium chloride flush  3 mL Intravenous Q12H  . vancomycin  1,500 mg Intravenous Q24H   Continuous:  JGG:EZMOQH chloride, acetaminophen **OR** acetaminophen, gabapentin, HYDROcodone-acetaminophen, ondansetron **OR** ondansetron (ZOFRAN) IV, polyethylene glycol  Assesment: She has cellulitis of the left lower extremity. She has acute on chronic diastolic heart failure which is improving. She has had episodes of atrial fib but she is stable with that now.  She has COPD at baseline. Principal Problem:   Cellulitis of left lower extremity Active Problems:   COPD  II    HTN (hypertension)   PAF (paroxysmal atrial fibrillation) (HCC)   Acute on chronic diastolic heart failure (HCC)   Chronic anticoagulation   Obesity    Plan: Continue treatments. If her IV comes out again I will switch her to oral meds. Potential discharge tomorrow    LOS: 1 day   Collins Kerby L 09/04/2016, 8:47 AM

## 2016-09-05 LAB — VANCOMYCIN, TROUGH: Vancomycin Tr: 24 ug/mL (ref 15–20)

## 2016-09-05 LAB — BASIC METABOLIC PANEL
ANION GAP: 13 (ref 5–15)
BUN: 24 mg/dL — ABNORMAL HIGH (ref 6–20)
CALCIUM: 8.7 mg/dL — AB (ref 8.9–10.3)
CHLORIDE: 81 mmol/L — AB (ref 101–111)
CO2: 28 mmol/L (ref 22–32)
Creatinine, Ser: 1.61 mg/dL — ABNORMAL HIGH (ref 0.44–1.00)
GFR calc Af Amer: 36 mL/min — ABNORMAL LOW (ref >60)
GFR calc non Af Amer: 31 mL/min — ABNORMAL LOW (ref >60)
GLUCOSE: 331 mg/dL — AB (ref 65–99)
Potassium: 3.7 mmol/L (ref 3.5–5.1)
Sodium: 122 mmol/L — ABNORMAL LOW (ref 135–145)

## 2016-09-05 MED ORDER — PREDNISONE 20 MG PO TABS
40.0000 mg | ORAL_TABLET | Freq: Every day | ORAL | Status: DC
  Administered 2016-09-05 – 2016-09-06 (×2): 40 mg via ORAL
  Filled 2016-09-05 (×2): qty 2

## 2016-09-05 NOTE — Progress Notes (Signed)
CRITICAL VALUE ALERT  Critical value received:  Vancomycin Trough = 24  Date of notification:  09/05/16  Time of notification:  2050  Critical value read back:Yes.    Nurse who received alert:  Jinny Sandersisha Winefred Hillesheim, RN  MD notified (1st page):  N/A  Time of first page:    MD notified (2nd page):  Time of second page:  Responding MD:   Time MD responded:  Value not reported per order [id number] no need to notify physician or pharmacist on call.  All vancomycin will be held per order until evaluated by pharmacist on rounds.

## 2016-09-05 NOTE — Progress Notes (Signed)
Subjective: She doesn't feel as well today. She still has some pain in her leg and she has developed what she thinks is gout in her left hand. She has had multiple bouts of this in the past and she is unfortunately intolerant of most medications for this. She had some hypoxia last night. Her IV has infiltrated. She still has pain when she ambulates in her left greater than right leg. She is mildly short of breath.  Objective: Vital signs in last 24 hours: Temp:  [98.2 F (36.8 C)-99.1 F (37.3 C)] 98.6 F (37 C) (12/13 0549) Pulse Rate:  [74-85] 85 (12/13 0549) Resp:  [18-20] 18 (12/13 0549) BP: (100-122)/(36-58) 122/58 (12/13 0549) SpO2:  [84 %-98 %] 98 % (12/13 0549) FiO2 (%):  [21 %] 21 % (12/12 0848) Weight:  [99.1 kg (218 lb 8 oz)] 99.1 kg (218 lb 8 oz) (12/13 0600) Weight change: 0.544 kg (1 lb 3.2 oz) Last BM Date: 09/02/16  Intake/Output from previous day: 12/12 0701 - 12/13 0700 In: 1960 [P.O.:1960] Out: 1950 [Urine:1950]  PHYSICAL EXAM General appearance: alert, cooperative, mild distress and morbidly obese Resp: clear to auscultation bilaterally Cardio: regular rate and rhythm, S1, S2 normal, no murmur, click, rub or gallop GI: soft, non-tender; bowel sounds normal; no masses,  no organomegaly Extremities: She still has erythema of the left leg. It seems to be improving. Leg is minimally warm She also shows some swelling of the area of her left wrist. Her skin is warm and dry except in her left leg. Mucous membranes are moist. Pupils react  Lab Results:  No results found for this or any previous visit (from the past 48 hour(s)).  ABGS No results for input(s): PHART, PO2ART, TCO2, HCO3 in the last 72 hours.  Invalid input(s): PCO2 CULTURES No results found for this or any previous visit (from the past 240 hour(s)). Studies/Results: No results found.  Medications:  Prior to Admission:  Prescriptions Prior to Admission  Medication Sig Dispense Refill Last Dose   . acetaminophen (TYLENOL) 500 MG tablet Take 500-1,000 mg by mouth as needed for mild pain.    09/01/2016 at Unknown time  . albuterol (PROVENTIL HFA;VENTOLIN HFA) 108 (90 Base) MCG/ACT inhaler Inhale 2 puffs into the lungs every 6 (six) hours as needed for wheezing or shortness of breath.   09/02/2016 at Unknown time  . apixaban (ELIQUIS) 5 MG TABS tablet Take 1 tablet (5 mg total) by mouth 2 (two) times daily. 60 tablet 5 09/02/2016 at 900a  . cetirizine (ZYRTEC) 10 MG tablet Take 10 mg by mouth daily.   09/02/2016 at Unknown time  . clindamycin (CLEOCIN) 150 MG capsule Take 150 mg by mouth 4 (four) times daily.  1 09/01/2016 at Unknown time  . diltiazem (CARDIZEM CD) 120 MG 24 hr capsule Take 1 capsule (120 mg total) by mouth daily. 90 capsule 3 09/02/2016 at Unknown time  . Fluticasone Furoate-Vilanterol 200-25 MCG/INH AEPB Inhale 1 puff into the lungs daily.   09/02/2016 at Unknown time  . gabapentin (NEURONTIN) 300 MG capsule Take 300-600 mg by mouth at bedtime as needed. restless leg syndrome   Past Week at Unknown time  . levalbuterol (XOPENEX) 0.63 MG/3ML nebulizer solution Take 3 mLs (0.63 mg total) by nebulization every 6 (six) hours as needed for wheezing or shortness of breath. 90 mL 12 Past Week at Unknown time  . levothyroxine (SYNTHROID, LEVOTHROID) 137 MCG tablet Take 137 mcg by mouth daily before breakfast.   09/02/2016 at Unknown  time  . Magnesium 250 MG TABS Take 2 tablets by mouth daily.    09/02/2016 at Unknown time  . methylPREDNISolone (MEDROL DOSEPAK) 4 MG TBPK tablet Take 1 tablet by mouth daily as needed. For gout  2 unknown  . metolazone (ZAROXOLYN) 5 MG tablet Take 1 tab daily for 3 days (Patient taking differently: Take 5 mg by mouth daily. Take 1 tab daily for 3 days) 3 tablet 0 09/01/2016 at Unknown time  . metoprolol tartrate (LOPRESSOR) 25 MG tablet Take 0.5 tablets (12.5 mg total) by mouth 2 (two) times daily. 90 tablet 3 09/02/2016 at 900a  . omeprazole (PRILOSEC) 20  MG capsule TAKE ONE CAPSULE BY MOUTH DAILY 90 capsule 3 09/02/2016 at Unknown time  . torsemide (DEMADEX) 20 MG tablet Take 60 mg by mouth 2 (two) times daily.    09/02/2016 at 900atime  . triamcinolone (NASACORT ALLERGY 24HR) 55 MCG/ACT AERO nasal inhaler Place 2 sprays into the nose daily.   UNKNOWN  . VOLTAREN 1 % GEL Apply 1 application topically daily as needed (for pain).   0 Past Week at Unknown time   Scheduled: . apixaban  5 mg Oral BID  . diltiazem  120 mg Oral Daily  . fluticasone  2 spray Each Nare Daily  . fluticasone furoate-vilanterol  1 puff Inhalation Daily  . levalbuterol  0.63 mg Nebulization TID  . levothyroxine  137 mcg Oral QAC breakfast  . loratadine  10 mg Oral Daily  . magnesium oxide  400 mg Oral Daily  . metoprolol tartrate  12.5 mg Oral BID  . pantoprazole  40 mg Oral Daily  . predniSONE  40 mg Oral Q breakfast  . sodium chloride flush  3 mL Intravenous Q12H  . vancomycin  1,500 mg Intravenous Q24H   Continuous:  ZOX:WRUEAVPRN:sodium chloride, acetaminophen **OR** acetaminophen, gabapentin, HYDROcodone-acetaminophen, ondansetron **OR** ondansetron (ZOFRAN) IV, polyethylene glycol  Assesment: She has cellulitis of the left lower leg. She is improving and he seems to be having a little more trouble. She had some hypoxia last night. She seems to have an episode of acute gout in her left hand. I thought she might be ready for discharge today but she is  not. Principal Problem:   Cellulitis of left lower extremity Active Problems:   COPD  II    HTN (hypertension)   PAF (paroxysmal atrial fibrillation) (HCC)   Acute on chronic diastolic heart failure (HCC)   Chronic anticoagulation   Obesity    Plan: Continue in the hospital. Restart IV for IV vancomycin. Start prednisone for gout. Continue other treatments.    LOS: 2 days   Davari Lopes L 09/05/2016, 8:21 AM

## 2016-09-05 NOTE — Progress Notes (Signed)
IV infiltrated, as per MD no new iv was placed. Pt is possible DC in the am.

## 2016-09-06 MED ORDER — LEVALBUTEROL HCL 0.63 MG/3ML IN NEBU: 0.6300 mg | INHALATION_SOLUTION | Freq: Two times a day (BID) | RESPIRATORY_TRACT | Status: DC

## 2016-09-06 MED ORDER — METHYLPREDNISOLONE 4 MG PO TBPK: ORAL_TABLET | ORAL | 2 refills | Status: DC

## 2016-09-06 MED ORDER — VANCOMYCIN HCL IN DEXTROSE 1-5 GM/200ML-% IV SOLN
1000.0000 mg | INTRAVENOUS | Status: DC
  Filled 2016-09-06: qty 200

## 2016-09-06 NOTE — Progress Notes (Signed)
Discharge instructions given, verbalized understanding, out in stable condition via w/c with staff. 

## 2016-09-06 NOTE — Progress Notes (Signed)
Pharmacy Antibiotic Note  Yolanda [name] is a 75 y.o. female admitted on 09/02/2016 with cellulitis.  Pharmacy has been consulted for vancomycin dosing.  Plan: Decrease Vancomycin to 1000 mg IV q24 hours F/u renal function, cultures and clinical course  Height: 5\' 1"  (154.9 cm) Weight: 218 lb 8 oz (99.1 kg) IBW/kg (Calculated) : 47.8  Temp (24hrs), Avg:98.5 F (36.9 C), Min:98.3 F (36.8 C), Max:98.6 F (37 C)   Recent Labs Lab 09/02/16 1539 09/02/16 1933 09/03/16 0524 09/05/16 1908  WBC 9.1  --   --   --   CREATININE 1.13*  --  1.13* 1.61*  LATICACIDVEN 1.6 2.4*  --   --   VANCOTROUGH  --   --   --  24*    Estimated Creatinine Clearance: 33.6 mL/min (by C-G formula based on SCr of 1.61 mg/dL (H)).    Allergies  Allergen Reactions  . Ciprofloxacin Shortness Of Breath    REACTION: sob,tachycardia  . Doxycycline Nausea Only    Also experienced diarrhea   . Fish Oil     Patient face drew to the side,Bells Palsey  . Guaifenesin Shortness Of Breath    REACTION: sob,tachycardia  . Sulfamethoxazole W/Trimethoprim 800-160 [Sulfamethoxazole-Trimethoprim] Nausea Only    Also lack of appetite   . Uloric [Febuxostat] Swelling    No urination  . Adhesive [Tape] Other (See Comments)    Causes blisters on skin  . Allopurinol Other (See Comments)    Couldn't urinate  . Tramadol     insomnia   Antimicrobials this admission: vanc 12/10 >>  Dose reduced on 12/14 due to elevated trough level.  Thank you for allowing pharmacy to be a part of this patient's care.  Valrie HartHall, Nahun Kronberg A 09/06/2016 8:22 AM

## 2016-09-06 NOTE — Progress Notes (Signed)
Subjective: She feels better and wants to go home. She is not short of breath now. Her leg feels better. Much less swelling and less erythema. No other new complaints. The gout that she had in her hand has improved. No chest pain she has not noticed any atrial fib no nausea vomiting or diarrhea.  Objective: Vital signs in last 24 hours: Temp:  [98.3 F (36.8 C)-98.6 F (37 C)] 98.3 F (36.8 C) (12/13 2055) Pulse Rate:  [80-87] 87 (12/13 2055) Resp:  [18-20] 20 (12/13 2055) BP: (127-137)/(47-49) 127/47 (12/13 2055) SpO2:  [90 %-93 %] 92 % (12/14 0742) Weight change:  Last BM Date: 09/04/16  Intake/Output from previous day: 12/13 0701 - 12/14 0700 In: 720 [P.O.:720] Out: 950 [Urine:950]  PHYSICAL EXAM General appearance: alert, cooperative and no distress Resp: clear to auscultation bilaterally Cardio: regular rate and rhythm, S1, S2 normal, no murmur, click, rub or gallop GI: soft, non-tender; bowel sounds normal; no masses,  no organomegaly Extremities: The erythema and swelling of her left lower extremity is much improved but not back at baseline Skin warm and dry. Mucous membranes are moist. Pupils are reactive  Lab Results:  Results for orders placed or performed during the hospital encounter of 09/02/16 (from the past 48 hour(s))  Vancomycin, trough     Status: Abnormal   Collection Time: 09/05/16  7:08 PM  Result Value Ref Range   Vancomycin Tr 24 (HH) 15 - 20 ug/mL    Comment: CRITICAL RESULT CALLED TO, READ BACK BY AND VERIFIED WITH: BULLEK,T AT 2050 ON 09/05/2016 BY ISLEY,B   Basic metabolic panel     Status: Abnormal   Collection Time: 09/05/16  7:08 PM  Result Value Ref Range   Sodium 122 (L) 135 - 145 mmol/L   Potassium 3.7 3.5 - 5.1 mmol/L   Chloride 81 (L) 101 - 111 mmol/L   CO2 28 22 - 32 mmol/L   Glucose, Bld 331 (H) 65 - 99 mg/dL   BUN 24 (H) 6 - 20 mg/dL   Creatinine, Ser 1.61 (H) 0.44 - 1.00 mg/dL   Calcium 8.7 (L) 8.9 - 10.3 mg/dL   GFR calc non Af  Amer 31 (L) >60 mL/min   GFR calc Af Amer 36 (L) >60 mL/min    Comment: (NOTE) The eGFR has been calculated using the CKD EPI equation. This calculation has not been validated in all clinical situations. eGFR's persistently <60 mL/min signify possible Chronic Kidney Disease.    Anion gap 13 5 - 15    ABGS No results for input(s): PHART, PO2ART, TCO2, HCO3 in the last 72 hours.  Invalid input(s): PCO2 CULTURES No results found for this or any previous visit (from the past 240 hour(s)). Studies/Results: No results found.  Medications:  Prior to Admission:  No prescriptions prior to admission.   Scheduled: Continuous: PRN:  Assesment:She was admitted with cellulitis and acute on chronic diastolic heart failure. At baseline she has COPD hypertension paroxysmal atrial fib she is chronically anticoagulated has obesity has had multiple bouts of gout including one here in the hospital and some element of renal failure. Her other medical problems are stable now. Principal Problem:   Cellulitis of left lower extremity Active Problems:   COPD  II    HTN (hypertension)   PAF (paroxysmal atrial fibrillation) (HCC)   Acute on chronic diastolic heart failure (HCC)   Chronic anticoagulation   Obesity    Plan: Discharge home today    LOS: 3 days  , L 09/06/2016, 8:52 AM

## 2016-09-07 DIAGNOSIS — M109 Gout, unspecified: Secondary | ICD-10-CM | POA: Diagnosis not present

## 2016-09-07 NOTE — Discharge Summary (Signed)
Physician Discharge Summary  Patient ID: Yolanda [name] MRN: [id number] DOB/AGE: October 13, 1940 75 y.o. Primary Care Physician:Alisen Marsiglia L, MD Admit date: 09/02/2016 Discharge date: 09/07/2016    Discharge Diagnoses:   Principal Problem:   Cellulitis of left lower extremity Active Problems:   COPD  II    HTN (hypertension)   PAF (paroxysmal atrial fibrillation) (HCC)   Acute on chronic diastolic heart failure (HCC)   Chronic anticoagulation   Obesity gout attack  Allergies as of 09/06/2016      Reactions   Ciprofloxacin Shortness Of Breath   REACTION: sob,tachycardia   Doxycycline Nausea Only   Also experienced diarrhea    Fish Oil    Patient face drew to the side,Bells Palsey   Guaifenesin Shortness Of Breath   REACTION: sob,tachycardia   Sulfamethoxazole W/trimethoprim 800-160 [sulfamethoxazole-trimethoprim] Nausea Only   Also lack of appetite    Uloric [febuxostat] Swelling   No urination   Adhesive [tape] Other (See Comments)   Causes blisters on skin   Allopurinol Other (See Comments)   Couldn't urinate   Tramadol    insomnia      Medication List    TAKE these medications   acetaminophen 500 MG tablet Commonly known as:  TYLENOL Take 500-1,000 mg by mouth as needed for mild pain.   albuterol 108 (90 Base) MCG/ACT inhaler Commonly known as:  PROVENTIL HFA;VENTOLIN HFA Inhale 2 puffs into the lungs every 6 (six) hours as needed for wheezing or shortness of breath.   apixaban 5 MG Tabs tablet Commonly known as:  ELIQUIS Take 1 tablet (5 mg total) by mouth 2 (two) times daily.   cetirizine 10 MG tablet Commonly known as:  ZYRTEC Take 10 mg by mouth daily.   clindamycin 150 MG capsule Commonly known as:  CLEOCIN Take 150 mg by mouth 4 (four) times daily.   diltiazem 120 MG 24 hr capsule Commonly known as:  CARDIZEM CD Take 1 capsule (120 mg total) by mouth daily.   fluticasone furoate-vilanterol 200-25 MCG/INH Aepb Commonly known as:  BREO  ELLIPTA Inhale 1 puff into the lungs daily.   gabapentin 300 MG capsule Commonly known as:  NEURONTIN Take 300-600 mg by mouth at bedtime as needed. restless leg syndrome   levalbuterol 0.63 MG/3ML nebulizer solution Commonly known as:  XOPENEX Take 3 mLs (0.63 mg total) by nebulization every 6 (six) hours as needed for wheezing or shortness of breath.   levothyroxine 137 MCG tablet Commonly known as:  SYNTHROID, LEVOTHROID Take 137 mcg by mouth daily before breakfast.   Magnesium 250 MG Tabs Take 2 tablets by mouth daily.   methylPREDNISolone 4 MG Tbpk tablet Commonly known as:  MEDROL DOSEPAK Take 1 tablet by mouth daily as needed. For gout What changed:  Another medication with the same name was added. Make sure you understand how and when to take each.   methylPREDNISolone 4 MG Tbpk tablet Commonly known as:  MEDROL Take by package directions What changed:  You were already taking a medication with the same name, and this prescription was added. Make sure you understand how and when to take each.   metolazone 5 MG tablet Commonly known as:  ZAROXOLYN Take 1 tab daily for 3 days What changed:  how much to take  how to take this  when to take this  additional instructions   metoprolol tartrate 25 MG tablet Commonly known as:  LOPRESSOR Take 0.5 tablets (12.5 mg total) by mouth 2 (two) times daily.  NASACORT ALLERGY 24HR 55 MCG/ACT Aero nasal inhaler Generic drug:  triamcinolone Place 2 sprays into the nose daily.   omeprazole 20 MG capsule Commonly known as:  PRILOSEC TAKE ONE CAPSULE BY MOUTH DAILY   torsemide 20 MG tablet Commonly known as:  DEMADEX Take 60 mg by mouth 2 (two) times daily.   VOLTAREN 1 % Gel Generic drug:  diclofenac sodium Apply 1 application topically daily as needed (for pain).       Discharged Condition:Improved    Consults: None  Significant Diagnostic Studies: Dg Chest 2 View  Result Date: 09/02/2016 CLINICAL DATA:   Weakness, dyspnea, cellulitis of the right leg with pain and swelling x6 days. EXAM: CHEST  2 VIEW COMPARISON:  01/15/2016 FINDINGS: The heart size and mediastinal contours are within normal limits. Both lungs are clear. The visualized skeletal structures are stable in appearance with degenerative change visualized along the thoracolumbar spine. No acute osseous abnormality. IMPRESSION: No active cardiopulmonary disease. Electronically Signed   By: Tollie Ethavid  Kwon M.D.   On: 09/02/2016 17:38    Lab Results: Basic Metabolic Panel:  Recent Labs  40/98/1112/13/17 1908  NA 122*  K 3.7  CL 81*  CO2 28  GLUCOSE 331*  BUN 24*  CREATININE 1.61*  CALCIUM 8.7*   Liver Function Tests: No results for input(s): AST, ALT, ALKPHOS, BILITOT, PROT, ALBUMIN in the last 72 hours.   CBC: No results for input(s): WBC, NEUTROABS, HGB, HCT, MCV, PLT in the last 72 hours.  No results found for this or any previous visit (from the past 240 hour(s)).   Hospital Course: This is a 75 year old who's had several bouts of cellulitis of her left leg. She started having erythema of her left leg and then developed a lot of swelling of the leg as well. She eventually came to the emergency department because things seem to be getting worse rather than better. She was diagnosed with cellulitis started on vancomycin. She improved over the next several days. She was eventually back at baseline and ready for discharge. While she was in the hospital she had an attack of gout which was treated with steroids. She is intolerant of most other medications for gout.  Discharge Exam: Blood pressure 132/76, pulse 81, temperature 98.8 F (37.1 C), temperature source Oral, resp. rate 18, height 5\' 1"  (1.549 m), weight 99.1 kg (218 lb 8 oz), SpO2 96 %. She is awake and alert. She still has some erythema and edema of her left lower extremity. She is morbidly obese. Her chest is clear. Her heart is regular.  Disposition: Home we discussed home  health services she does not feel that she needs that.    Follow-up Information    Holt Woolbright L, MD Follow up.   Specialty:  Pulmonary Disease Why:  The office will call you with an appointment Contact information: [address] [place] [id number] [phone]           Signed: Rainee Sweatt L   09/07/2016, 7:34 AM

## 2016-10-03 ENCOUNTER — Ambulatory Visit: Payer: Medicare Other | Admitting: Orthopaedic Surgery

## 2016-10-08 ENCOUNTER — Ambulatory Visit (INDEPENDENT_AMBULATORY_CARE_PROVIDER_SITE_OTHER): Payer: Medicare Other | Admitting: Cardiovascular Disease

## 2016-10-08 ENCOUNTER — Encounter: Payer: Self-pay | Admitting: Cardiovascular Disease

## 2016-10-08 VITALS — BP 131/74 | HR 90 | Ht 61.0 in | Wt 215.0 lb

## 2016-10-08 DIAGNOSIS — I480 Paroxysmal atrial fibrillation: Secondary | ICD-10-CM

## 2016-10-08 DIAGNOSIS — I5033 Acute on chronic diastolic (congestive) heart failure: Secondary | ICD-10-CM

## 2016-10-08 DIAGNOSIS — I10 Essential (primary) hypertension: Secondary | ICD-10-CM

## 2016-10-08 DIAGNOSIS — Z7901 Long term (current) use of anticoagulants: Secondary | ICD-10-CM

## 2016-10-08 DIAGNOSIS — R609 Edema, unspecified: Secondary | ICD-10-CM

## 2016-10-08 DIAGNOSIS — Z9289 Personal history of other medical treatment: Secondary | ICD-10-CM | POA: Diagnosis not present

## 2016-10-08 MED ORDER — METOLAZONE 5 MG PO TABS: ORAL_TABLET | ORAL | 1 refills | Status: DC

## 2016-10-08 NOTE — Patient Instructions (Addendum)
Your physician recommends that you schedule a follow-up appointment in: 1 MONTH WITH DR. Purvis SheffieldKONESWARAN   Your physician has recommended you make the following change in your medication:   START METOLAZONE 5 MG EVERY Monday Wednesday AND Friday   Thank you for choosing [facility]!!

## 2016-10-08 NOTE — Progress Notes (Signed)
SUBJECTIVE: The patient presents for follow-up of paroxysmal atrial fibrillation and acute on chronic diastolic heart failure. She was hospitalized for left leg cellulitis in December 2017.  Weight today 215 pounds, 227 pounds on 08/31/16.  She still has left leg swelling. She has bilateral feet neuropathy. Chronic exertional dyspnea has not gotten worse. She denies chest pain.   Review of Systems: As per "subjective", otherwise negative.  Allergies  Allergen Reactions  . Ciprofloxacin Shortness Of Breath    REACTION: sob,tachycardia  . Doxycycline Nausea Only    Also experienced diarrhea   . Fish Oil     Patient face drew to the side,Bells Palsey  . Guaifenesin Shortness Of Breath    REACTION: sob,tachycardia  . Sulfamethoxazole W/Trimethoprim 800-160 [Sulfamethoxazole-Trimethoprim] Nausea Only    Also lack of appetite   . Uloric [Febuxostat] Swelling    No urination  . Adhesive [Tape] Other (See Comments)    Causes blisters on skin  . Allopurinol Other (See Comments)    Couldn't urinate  . Cefuroxime Axetil Swelling    Swelling all over body-per patient she was hospitalized as a result of taking this medication  . Tramadol     insomnia    Current Outpatient Prescriptions  Medication Sig Dispense Refill  . acetaminophen (TYLENOL) 500 MG tablet Take 500-1,000 mg by mouth as needed for mild pain.     Marland Kitchen. albuterol (PROVENTIL HFA;VENTOLIN HFA) 108 (90 Base) MCG/ACT inhaler Inhale 2 puffs into the lungs every 6 (six) hours as needed for wheezing or shortness of breath.    Marland Kitchen. apixaban (ELIQUIS) 5 MG TABS tablet Take 1 tablet (5 mg total) by mouth 2 (two) times daily. 60 tablet 5  . cetirizine (ZYRTEC) 10 MG tablet Take 10 mg by mouth daily.    Marland Kitchen. diltiazem (CARDIZEM CD) 120 MG 24 hr capsule Take 1 capsule (120 mg total) by mouth daily. 90 capsule 3  . Fluticasone Furoate-Vilanterol 200-25 MCG/INH AEPB Inhale 1 puff into the lungs daily.    Marland Kitchen. gabapentin (NEURONTIN) 300 MG  capsule Take 300-600 mg by mouth at bedtime as needed. restless leg syndrome    . levalbuterol (XOPENEX) 0.63 MG/3ML nebulizer solution Take 3 mLs (0.63 mg total) by nebulization every 6 (six) hours as needed for wheezing or shortness of breath. 90 mL 12  . levothyroxine (SYNTHROID, LEVOTHROID) 137 MCG tablet Take 137 mcg by mouth daily before breakfast.    . Magnesium 250 MG TABS Take 2 tablets by mouth daily.     . methylPREDNISolone (MEDROL DOSEPAK) 4 MG TBPK tablet Take 1 tablet by mouth daily as needed. For gout  2  . metoprolol tartrate (LOPRESSOR) 25 MG tablet Take 0.5 tablets (12.5 mg total) by mouth 2 (two) times daily. 90 tablet 3  . omeprazole (PRILOSEC) 20 MG capsule TAKE ONE CAPSULE BY MOUTH DAILY 90 capsule 3  . torsemide (DEMADEX) 20 MG tablet Take 60 mg by mouth 2 (two) times daily.     Marland Kitchen. triamcinolone (NASACORT ALLERGY 24HR) 55 MCG/ACT AERO nasal inhaler Place 2 sprays into the nose daily.    . VOLTAREN 1 % GEL Apply 1 application topically daily as needed (for pain).   0   No current facility-administered medications for this visit.     Past Medical History:  Diagnosis Date  . Barrett esophagus   . Bronchitis   . Cellulitis   . CHF (congestive heart failure) (HCC)   . COPD (chronic obstructive pulmonary disease) (HCC)   .  Diastolic heart failure (HCC)   . Essential hypertension   . GERD (gastroesophageal reflux disease)   . Hypothyroidism   . PAF (paroxysmal atrial fibrillation) (HCC)   . Stage III chronic kidney disease     Past Surgical History:  Procedure Laterality Date  . BREAST BIOPSY Right Sept, 2012  . CHOLECYSTECTOMY    . COLONOSCOPY    . UPPER GASTROINTESTINAL ENDOSCOPY      Social History   Social History  . Marital status: Widowed    Spouse name: N/A  . Number of children: N/A  . Years of education: N/A   Occupational History  . Not on file.   Social History Main Topics  . Smoking status: Former Smoker    Packs/day: 0.50    Years: 30.00     Types: Cigarettes    Start date: 09/24/1958    Quit date: 11/13/1999  . Smokeless tobacco: Never Used  . Alcohol use No  . Drug use: No  . Sexual activity: Not Currently   Other Topics Concern  . Not on file   Social History Narrative  . No narrative on file     Vitals:   10/08/16 1415  BP: 131/74  Pulse: 90  Weight: 215 lb (97.5 kg)  Height: 5\' 1"  (1.549 m)    PHYSICAL EXAM General: NAD HEENT: Normal. Neck: No JVD, no thyromegaly. Lungs: Clear to auscultation bilaterally with normal respiratory effort. CV: Nondisplaced PMI.  Regular rate and rhythm, normal S1/S2, no S3/S4, no murmur. 1+ left pretibial and periankle edema and trivial on the right.  Abdomen: Soft, nontender, obese.  Neurologic: Alert and oriented.  Psych: Normal affect. Skin: Normal. Musculoskeletal: No gross deformities.    ECG: Most recent ECG reviewed.      ASSESSMENT AND PLAN: 1. Paroxysmal atrial fibrillation: Remains in a regular rhythm. Currently on long-acting diltiazem 120 mg daily and metoprolol 12.5 mg bid, and tolerating Eliquis for anticoagulation without difficulties.   2. Acute on chronic diastolic heart failure: Now on torsemide 60 mg bid. Lower extremity swelling also due to chronic venous insufficiency due to obesity.  Will give metolazone 5 mg three days a week indefinitely.  3. Essential hypertension: Controlled. No changes.  4. CKD stage III: Follows with nephrology.  Dispo: f/u 6 wks  Prentice Docker, M.D., F.A.C.C.

## 2016-10-10 ENCOUNTER — Ambulatory Visit: Payer: Medicare Other | Admitting: Orthopaedic Surgery

## 2016-10-17 ENCOUNTER — Encounter: Payer: Self-pay | Admitting: Cardiovascular Disease

## 2016-10-17 ENCOUNTER — Other Ambulatory Visit: Payer: Self-pay | Admitting: *Deleted

## 2016-10-17 MED ORDER — METOLAZONE 5 MG PO TABS: ORAL_TABLET | ORAL | 1 refills | Status: DC

## 2016-10-22 ENCOUNTER — Other Ambulatory Visit (HOSPITAL_COMMUNITY): Payer: Self-pay | Admitting: Podiatry

## 2016-10-22 DIAGNOSIS — M79671 Pain in right foot: Secondary | ICD-10-CM

## 2016-10-22 DIAGNOSIS — I890 Lymphedema, not elsewhere classified: Secondary | ICD-10-CM

## 2016-10-22 DIAGNOSIS — M79672 Pain in left foot: Principal | ICD-10-CM

## 2016-10-22 DIAGNOSIS — I739 Peripheral vascular disease, unspecified: Secondary | ICD-10-CM

## 2016-10-22 DIAGNOSIS — R609 Edema, unspecified: Secondary | ICD-10-CM

## 2016-10-23 ENCOUNTER — Other Ambulatory Visit: Payer: Self-pay | Admitting: Cardiology

## 2016-10-24 ENCOUNTER — Ambulatory Visit (HOSPITAL_COMMUNITY)
Admission: RE | Admit: 2016-10-24 | Discharge: 2016-10-24 | Disposition: A | Discharge status: Home / Self Care | Payer: Medicare Other | Source: Ambulatory Visit | Attending: Podiatry | Admitting: Podiatry

## 2016-10-24 ENCOUNTER — Encounter (HOSPITAL_COMMUNITY): Payer: Self-pay

## 2016-10-30 ENCOUNTER — Ambulatory Visit: Payer: Medicare Other | Admitting: Orthopaedic Surgery

## 2016-11-07 ENCOUNTER — Encounter: Payer: Self-pay | Admitting: Cardiovascular Disease

## 2016-11-07 ENCOUNTER — Ambulatory Visit (INDEPENDENT_AMBULATORY_CARE_PROVIDER_SITE_OTHER): Payer: Medicare Other | Admitting: Cardiovascular Disease

## 2016-11-07 ENCOUNTER — Other Ambulatory Visit: Payer: Self-pay | Admitting: *Deleted

## 2016-11-07 VITALS — BP 160/78 | HR 86 | Ht 61.0 in | Wt 214.0 lb

## 2016-11-07 DIAGNOSIS — Z7901 Long term (current) use of anticoagulants: Secondary | ICD-10-CM

## 2016-11-07 DIAGNOSIS — I480 Paroxysmal atrial fibrillation: Secondary | ICD-10-CM | POA: Diagnosis not present

## 2016-11-07 DIAGNOSIS — I10 Essential (primary) hypertension: Secondary | ICD-10-CM

## 2016-11-07 DIAGNOSIS — I5033 Acute on chronic diastolic (congestive) heart failure: Secondary | ICD-10-CM | POA: Diagnosis not present

## 2016-11-07 DIAGNOSIS — Z79899 Other long term (current) drug therapy: Secondary | ICD-10-CM

## 2016-11-07 MED ORDER — TORSEMIDE 20 MG PO TABS: 80.0000 mg | ORAL_TABLET | Freq: Two times a day (BID) | ORAL | 6 refills | Status: DC

## 2016-11-07 NOTE — Patient Instructions (Addendum)
Medication Instructions:   Increase Torsemide to 80mg  twice a day.  Metolazone removed from medication list & placed on allergic list.  Continue all other medications.    Labwork:  BMET - do on Monday, 11/12/2016 - order given today.  Office will contact with results via phone or letter.    Testing/Procedures: none  Follow-Up: 2 months   Any Other Special Instructions Will Be Listed Below (If Applicable).  If you need a refill on your cardiac medications before your next appointment, please call your pharmacy.

## 2016-11-07 NOTE — Progress Notes (Signed)
SUBJECTIVE: The patient presents for follow-up of acute on chronic diastolic heart failure. Weight 215 pounds on 10/08/16. Weight today is 214 pounds.  I prescribed metolazone at her last visit but she became nauseous and stopped taking it. Previously when she has taken this, she developed increased swelling and ended up being hospitalized.  She checked her blood pressure this morning and it was 127/58.   Review of Systems: As per "subjective", otherwise negative.  Allergies  Allergen Reactions  . Ciprofloxacin Shortness Of Breath    REACTION: sob,tachycardia  . Doxycycline Nausea Only    Also experienced diarrhea   . Fish Oil     Patient face drew to the side,Bells Palsey  . Guaifenesin Shortness Of Breath    REACTION: sob,tachycardia  . Sulfamethoxazole W/Trimethoprim 800-160 [Sulfamethoxazole-Trimethoprim] Nausea Only    Also lack of appetite   . Uloric [Febuxostat] Swelling    No urination  . Adhesive [Tape] Other (See Comments)    Causes blisters on skin  . Allopurinol Other (See Comments)    Couldn't urinate  . Cefuroxime Axetil Swelling    Swelling all over body-per patient she was hospitalized as a result of taking this medication  . Tramadol     insomnia    Current Outpatient Prescriptions  Medication Sig Dispense Refill  . acetaminophen (TYLENOL) 500 MG tablet Take 500-1,000 mg by mouth as needed for mild pain.     Marland Kitchen albuterol (PROVENTIL HFA;VENTOLIN HFA) 108 (90 Base) MCG/ACT inhaler Inhale 2 puffs into the lungs every 6 (six) hours as needed for wheezing or shortness of breath.    Marland Kitchen apixaban (ELIQUIS) 5 MG TABS tablet Take 1 tablet (5 mg total) by mouth 2 (two) times daily. 60 tablet 5  . cetirizine (ZYRTEC) 10 MG tablet Take 10 mg by mouth daily.    Marland Kitchen CLINDAMYCIN HCL PO Take by mouth 4 (four) times daily.    Marland Kitchen diltiazem (CARDIZEM CD) 120 MG 24 hr capsule Take 1 capsule (120 mg total) by mouth daily. 90 capsule 3  . Fluticasone Furoate-Vilanterol 200-25  MCG/INH AEPB Inhale 1 puff into the lungs daily.    Marland Kitchen gabapentin (NEURONTIN) 300 MG capsule Take 300-600 mg by mouth at bedtime as needed. restless leg syndrome    . levalbuterol (XOPENEX) 0.63 MG/3ML nebulizer solution Take 3 mLs (0.63 mg total) by nebulization every 6 (six) hours as needed for wheezing or shortness of breath. 90 mL 12  . levothyroxine (SYNTHROID, LEVOTHROID) 137 MCG tablet Take 137 mcg by mouth daily before breakfast.    . Magnesium 250 MG TABS Take 2 tablets by mouth daily.     . methylPREDNISolone (MEDROL DOSEPAK) 4 MG TBPK tablet Take 1 tablet by mouth daily as needed. For gout  2  . metolazone (ZAROXOLYN) 5 MG tablet TAKE 1 TAB EVERY MON, WED, FRI ( HOLDING AS OF 10/17/2016 ) 12 tablet 1  . metoprolol tartrate (LOPRESSOR) 25 MG tablet TAKE 1/2 TABLET BY MOUTH TWICE DAILY 90 tablet 1  . omeprazole (PRILOSEC) 20 MG capsule TAKE ONE CAPSULE BY MOUTH DAILY 90 capsule 3  . torsemide (DEMADEX) 20 MG tablet Take 60 mg by mouth 2 (two) times daily.     Marland Kitchen triamcinolone (NASACORT ALLERGY 24HR) 55 MCG/ACT AERO nasal inhaler Place 2 sprays into the nose daily.    . VOLTAREN 1 % GEL Apply 1 application topically daily as needed (for pain).   0   No current facility-administered medications for this visit.  Past Medical History:  Diagnosis Date  . Barrett esophagus   . Bronchitis   . Cellulitis   . CHF (congestive heart failure) (HCC)   . COPD (chronic obstructive pulmonary disease) (HCC)   . Diastolic heart failure (HCC)   . Essential hypertension   . GERD (gastroesophageal reflux disease)   . Hypothyroidism   . PAF (paroxysmal atrial fibrillation) (HCC)   . Stage III chronic kidney disease     Past Surgical History:  Procedure Laterality Date  . BREAST BIOPSY Right Sept, 2012  . CHOLECYSTECTOMY    . COLONOSCOPY    . UPPER GASTROINTESTINAL ENDOSCOPY      Social History   Social History  . Marital status: Widowed    Spouse name: N/A  . Number of children: N/A    . Years of education: N/A   Occupational History  . Not on file.   Social History Main Topics  . Smoking status: Former Smoker    Packs/day: 0.50    Years: 30.00    Types: Cigarettes    Start date: 09/24/1958    Quit date: 11/13/1999  . Smokeless tobacco: Never Used  . Alcohol use No  . Drug use: No  . Sexual activity: Not Currently   Other Topics Concern  . Not on file   Social History Narrative  . No narrative on file     Vitals:   11/07/16 1350  BP: (!) 160/78  Pulse: 86  SpO2: 96%  Weight: 214 lb (97.1 kg)  Height: 5\' 1"  (1.549 m)    PHYSICAL EXAM General: NAD HEENT: Normal. Neck: No JVD, no thyromegaly. Lungs: Clear to auscultation bilaterally with normal respiratory effort. CV: Nondisplaced PMI. Regular rate and rhythm, normal S1/S2, no S3/S4, no murmur. 1-2+ left pretibial and periankle edema and trivial on the right. Abdomen: Soft, nontender, obese.  Neurologic: Alert and oriented.  Psych: Normal affect. Skin: Normal. Musculoskeletal: No gross deformities.    ECG: Most recent ECG reviewed.      ASSESSMENT AND PLAN: 1. Paroxysmal atrial fibrillation: Remains in a regularrhythm. Currently on long-acting diltiazem 120 mg daily and metoprolol 12.5 mg bid, and tolerating Eliquis for anticoagulation without difficulties.  Can hold Eliquis day before and day of dental procedure (tooth extraction).  2. Acute on chronic diastolic heart failure: Now ontorsemide 60 mg bid. Lower extremity swelling also due to chronic venous insufficiency due to obesity.  I prescribed metolazone 3 days per week but she did not tolerate it. I will increase torsemide to 80 mg bid and check BMET on 2/19.  3. Essential hypertension: Elevated today, normal at last visit and earlier this morning. Will monitor.  4. CKD stage III: Follows withnephrology.  Dispo: f/u 2 months.  Prentice DockerSuresh Koneswaran, M.D., F.A.C.C.

## 2016-11-12 ENCOUNTER — Other Ambulatory Visit: Payer: Self-pay | Admitting: Cardiovascular Disease

## 2016-11-12 LAB — BASIC METABOLIC PANEL
BUN: 31 mg/dL — ABNORMAL HIGH (ref 7–25)
CALCIUM: 9.3 mg/dL (ref 8.6–10.4)
CO2: 29 mmol/L (ref 20–31)
Chloride: 100 mmol/L (ref 98–110)
Creat: 1.51 mg/dL — ABNORMAL HIGH (ref 0.60–0.93)
GLUCOSE: 266 mg/dL — AB (ref 65–99)
Potassium: 3.8 mmol/L (ref 3.5–5.3)
Sodium: 140 mmol/L (ref 135–146)

## 2016-11-13 ENCOUNTER — Telehealth: Payer: Self-pay | Admitting: *Deleted

## 2016-11-13 MED ORDER — TORSEMIDE 20 MG PO TABS: ORAL_TABLET | ORAL | Status: DC

## 2016-11-13 NOTE — Telephone Encounter (Signed)
Notes Recorded by Lesle ChrisAngela G Hill, LPN on 1/61/09602/20/2018 at 10:46 AM EST Patient notified and verbalized understanding. Copy to pmd. ------  Notes Recorded by Laqueta LindenSuresh A Koneswaran, MD on 11/13/2016 at 6:00 AM EST BUN mildly increased but creatinine slightly lower. Reduce torsemide to 80 mg q am and 60 mg q pm.

## 2016-12-04 ENCOUNTER — Telehealth: Payer: Self-pay | Admitting: Cardiovascular Disease

## 2016-12-04 DIAGNOSIS — I5033 Acute on chronic diastolic (congestive) heart failure: Secondary | ICD-10-CM

## 2016-12-04 DIAGNOSIS — I10 Essential (primary) hypertension: Secondary | ICD-10-CM

## 2016-12-04 NOTE — Telephone Encounter (Signed)
Patient has medication questions. °

## 2016-12-04 NOTE — Telephone Encounter (Signed)
Been taking Torsemide - woke up mid morning with terrible cramp in back, like a spasm.  Tylenol & heating pad helped.  Soreness through the back.  Started last Thursday or Friday.  Stated that she did dosage on Sunday.  States that when she cuts back on the dose it feels better, thinks Torsemide could be the cause.  SOB - a little noted on Sunday, but had not taken morning dose of the Torsemide yet.  No chest pain, dizziness, or weight gain.

## 2016-12-04 NOTE — Telephone Encounter (Signed)
Check BMET and Mg. 

## 2016-12-04 NOTE — Telephone Encounter (Signed)
Patient notified.  [facility]

## 2016-12-07 LAB — BASIC METABOLIC PANEL
BUN: 26 mg/dL — ABNORMAL HIGH (ref 7–25)
CALCIUM: 9.2 mg/dL (ref 8.6–10.4)
CO2: 27 mmol/L (ref 20–31)
Chloride: 101 mmol/L (ref 98–110)
Creat: 1.32 mg/dL — ABNORMAL HIGH (ref 0.60–0.93)
GLUCOSE: 119 mg/dL — AB (ref 65–99)
POTASSIUM: 3.4 mmol/L — AB (ref 3.5–5.3)
SODIUM: 142 mmol/L (ref 135–146)

## 2016-12-07 LAB — MAGNESIUM: Magnesium: 1.8 mg/dL (ref 1.5–2.5)

## 2016-12-10 ENCOUNTER — Telehealth: Payer: Self-pay | Admitting: *Deleted

## 2016-12-10 DIAGNOSIS — E876 Hypokalemia: Secondary | ICD-10-CM

## 2016-12-10 MED ORDER — POTASSIUM CHLORIDE CRYS ER 20 MEQ PO TBCR: 20.0000 meq | EXTENDED_RELEASE_TABLET | Freq: Every day | ORAL | Status: DC

## 2016-12-10 NOTE — Telephone Encounter (Signed)
Notes recorded by Lesle ChrisAngela G Benjamyn Hestand, LPN on 1/61/09603/19/2018 at 2:56 PM EDT Patient notified. Copy to pmd. [facility] as she has been doing the 20meq since Friday. Also, stated that she would just try to stay with the Torsemide. ------  Notes recorded by Laqueta LindenSuresh A Koneswaran, MD on 12/10/2016 at 10:13 AM EDT Take KCl 20 meq daily, repeat BMET in one week. I am concerned Lasix may not be as effective for her. She is welcome to try but she may develop more leg swelling. ------  Notes recorded by Lesle ChrisAngela G Trevell Pariseau, LPN on 4/54/09813/16/2018 at 2:00 PM EDT Patient notified. Patient stated that she has not been taking any Potassium for about the last 6 months or longer due to her nephrologist stopping. Stated that she did have some at home that are 20meq tabs. She also is questioning how you feel about her changing back to the Furosemide. Stated she has gotten a little scared of the Torsemide due to causing her to have back pain. Patient was advised to take her Potassium 20meq - one tab daily that she has at home throughout the weekend. Office will call her back on Monday with answer as Dr. Purvis SheffieldKoneswaran is out of the office this afternoon. ------  Notes recorded by Laqueta LindenSuresh A Koneswaran, MD on 12/07/2016 at 10:35 AM EDT Double up on KCl for 3 days and repeat BMET in one week.

## 2016-12-11 ENCOUNTER — Other Ambulatory Visit: Payer: Self-pay | Admitting: Cardiovascular Disease

## 2016-12-12 ENCOUNTER — Other Ambulatory Visit: Payer: Self-pay | Admitting: *Deleted

## 2016-12-12 ENCOUNTER — Telehealth: Payer: Self-pay | Admitting: *Deleted

## 2016-12-12 DIAGNOSIS — R350 Frequency of micturition: Secondary | ICD-10-CM

## 2016-12-12 NOTE — Telephone Encounter (Signed)
-----   Message from Laqueta LindenSuresh A Koneswaran, MD sent at 12/12/2016  2:44 PM EDT ----- Regarding: RE: add test  That would be fine.  ----- Message ----- From: Lesle ChrisAngela G Hill, LPN Sent: 1/61/09603/21/2018   1:23 PM To: Laqueta LindenSuresh A Koneswaran, MD Subject: add test                                       Wants to know if Dr. Kirtland BouchardK will okay to add urinalysis to her lab work - doing BMET due to low potassium.  Thanks,  Inocencio HomesGayle

## 2016-12-12 NOTE — Telephone Encounter (Signed)
Patient notified via voice mail.  [facility]

## 2016-12-13 ENCOUNTER — Other Ambulatory Visit: Payer: Self-pay | Admitting: Cardiovascular Disease

## 2016-12-13 ENCOUNTER — Telehealth: Payer: Self-pay | Admitting: *Deleted

## 2016-12-13 DIAGNOSIS — R7989 Other specified abnormal findings of blood chemistry: Secondary | ICD-10-CM

## 2016-12-13 LAB — URINALYSIS
Bilirubin Urine: NEGATIVE
Ketones, ur: NEGATIVE
LEUKOCYTES UA: NEGATIVE
NITRITE: NEGATIVE
PH: 6 (ref 5.0–8.0)
Protein, ur: NEGATIVE
SPECIFIC GRAVITY, URINE: 1.014 (ref 1.001–1.035)

## 2016-12-13 LAB — BASIC METABOLIC PANEL
BUN: 53 mg/dL — ABNORMAL HIGH (ref 7–25)
CALCIUM: 9.5 mg/dL (ref 8.6–10.4)
CO2: 31 mmol/L (ref 20–31)
CREATININE: 1.69 mg/dL — AB (ref 0.60–0.93)
Chloride: 96 mmol/L — ABNORMAL LOW (ref 98–110)
Glucose, Bld: 170 mg/dL — ABNORMAL HIGH (ref 65–99)
Potassium: 4.6 mmol/L (ref 3.5–5.3)
SODIUM: 140 mmol/L (ref 135–146)

## 2016-12-13 MED ORDER — TORSEMIDE 20 MG PO TABS: 60.0000 mg | ORAL_TABLET | Freq: Two times a day (BID) | ORAL | Status: DC

## 2016-12-13 NOTE — Telephone Encounter (Signed)
Notes recorded by Lesle ChrisAngela G Hill, LPN on 3/08/65783/22/2018 at 4:35 PM EDT Patient notified. Will put lab order in [facility] & [facility]. ------  Notes recorded by Laqueta LindenSuresh A Koneswaran, MD on 12/13/2016 at 12:10 PM EDT Is she currently taking 80 mg bid torsemide? If so, would reduce to 60 mg bid given worsening renal function. She can then take an extra 20 mg as needed. After doing so, BMET should be repeated one week later to assess renal function.

## 2016-12-21 LAB — BASIC METABOLIC PANEL
BUN: 36 mg/dL — ABNORMAL HIGH (ref 7–25)
CALCIUM: 9.3 mg/dL (ref 8.6–10.4)
CHLORIDE: 100 mmol/L (ref 98–110)
CO2: 30 mmol/L (ref 20–31)
Creat: 1.18 mg/dL — ABNORMAL HIGH (ref 0.60–0.93)
Glucose, Bld: 138 mg/dL — ABNORMAL HIGH (ref 65–99)
Potassium: 3.9 mmol/L (ref 3.5–5.3)
SODIUM: 141 mmol/L (ref 135–146)

## 2016-12-27 ENCOUNTER — Emergency Department (HOSPITAL_COMMUNITY)
Admission: EM | Admit: 2016-12-27 | Discharge: 2016-12-27 | Disposition: A | Discharge status: Home / Self Care | Payer: Medicare Other | Source: Home / Self Care | Attending: Emergency Medicine | Admitting: Emergency Medicine

## 2016-12-27 ENCOUNTER — Encounter (HOSPITAL_COMMUNITY): Payer: Self-pay | Admitting: Emergency Medicine

## 2016-12-27 ENCOUNTER — Emergency Department (HOSPITAL_COMMUNITY): Payer: Medicare Other

## 2016-12-27 DIAGNOSIS — M549 Dorsalgia, unspecified: Secondary | ICD-10-CM

## 2016-12-27 DIAGNOSIS — I130 Hypertensive heart and chronic kidney disease with heart failure and stage 1 through stage 4 chronic kidney disease, or unspecified chronic kidney disease: Secondary | ICD-10-CM | POA: Insufficient documentation

## 2016-12-27 DIAGNOSIS — E039 Hypothyroidism, unspecified: Secondary | ICD-10-CM | POA: Insufficient documentation

## 2016-12-27 DIAGNOSIS — N183 Chronic kidney disease, stage 3 (moderate): Secondary | ICD-10-CM

## 2016-12-27 DIAGNOSIS — Z87891 Personal history of nicotine dependence: Secondary | ICD-10-CM

## 2016-12-27 DIAGNOSIS — I5030 Unspecified diastolic (congestive) heart failure: Secondary | ICD-10-CM

## 2016-12-27 DIAGNOSIS — R531 Weakness: Secondary | ICD-10-CM | POA: Diagnosis not present

## 2016-12-27 DIAGNOSIS — M62830 Muscle spasm of back: Secondary | ICD-10-CM | POA: Insufficient documentation

## 2016-12-27 DIAGNOSIS — J449 Chronic obstructive pulmonary disease, unspecified: Secondary | ICD-10-CM

## 2016-12-27 DIAGNOSIS — N390 Urinary tract infection, site not specified: Secondary | ICD-10-CM | POA: Diagnosis not present

## 2016-12-27 DIAGNOSIS — M62838 Other muscle spasm: Secondary | ICD-10-CM

## 2016-12-27 MED ORDER — HYDROCODONE-ACETAMINOPHEN 5-325 MG PO TABS: 1.0000 | ORAL_TABLET | ORAL | 0 refills | Status: DC | PRN

## 2016-12-27 MED ORDER — MORPHINE SULFATE (PF) 4 MG/ML IV SOLN
4.0000 mg | Freq: Once | INTRAVENOUS | Status: AC
  Administered 2016-12-27: 4 mg via INTRAVENOUS
  Filled 2016-12-27: qty 1

## 2016-12-27 NOTE — Discharge Instructions (Signed)
Please take pain medication (Norco) with either baclofen or cyclobenzapril (not both muscle relaxants).   Follow-up closely with Dr. Juanetta Gosling  Return without fail for worsening symptoms, including escalating pain, difficulty breathing, confusion or any other symptoms concerning to you.

## 2016-12-27 NOTE — ED Triage Notes (Signed)
Patient c/o right shoulder and upper back pain that started Tuesday. Denies any injury. Per patient spasm pain. Patient seen by Dr Juanetta Gosling and given Baclofen  and cyclobenzaprine  which she reports no relief. Denies having any xrays. Patient has bruise on back. Per patient on blood thinners and has been rubbing shoulder.

## 2016-12-27 NOTE — ED Provider Notes (Signed)
[facility]-EMER[facility]ENCY DEPT Provider Note   CSN: [id number] Arrival date & time: 12/27/16  1756  By signing my name below, I, Yolanda [name], attest that this documentation has been prepared under the direction and in the presence of Lavera Guise, MD. Electronically Signed: Cynda [name], Scribe. 12/27/16. 9:04 PM.  History   Chief Complaint Chief Complaint  Patient presents with  . Shoulder Pain    HPI Comments: Yolanda [name] is a 76 y.o. female with a history of CHF, COPD, HTN, PAF on eliquis, and stage III CKD, who presents to the Emergency Department complaining of sudden-onset, intermittent right posterior shoulder pain that began 3 days ago. Patient states his pain is similar to prior "muscle spasms". Patient was seen by Dr. Juanetta Gosling, in which he was prescribed Baclofen  and cyclobenzaprine . Patient reports associated upper back pain with shoulder pain. Patient reports trying to massage her shoulder with a back scratcher, which made her bruise due to being on a blood thinner. Patient was prescribed cyclobenzaprine with no relief in pain, the last time medications was taken, was 3PM. Patient states she is unsure if she had pain medication in the past that has improved her pain. Per friend bedside the patient has been overactive for the past few days and may have strained something.  Patient denies any middle back pain, chest pain, shortness of breath, nausea, vomiting, diarrhea, fever, cough, extremity weakness, or numbness.   The history is provided by the patient. No language interpreter was used.    Past Medical History:  Diagnosis Date  . Barrett esophagus   . Bronchitis   . Cellulitis   . CHF (congestive heart failure) (HCC)   . COPD (chronic obstructive pulmonary disease) (HCC)   . Diastolic heart failure (HCC)   . Essential hypertension   . GERD (gastroesophageal reflux disease)   . Hypothyroidism   . PAF (paroxysmal atrial fibrillation) (HCC)   . Stage III chronic kidney  disease     Patient Active Problem List   Diagnosis Date Noted  . Gout attack 09/07/2016  . Chronic anticoagulation 02/02/2016  . Chronic edema 02/02/2016  . Obesity 02/02/2016  . Sleep apnea 02/02/2016  . RBBB 02/02/2016  . Acute on chronic diastolic heart failure (HCC) 01/13/2016  . Hyponatremia 01/06/2016  . AKI (acute kidney injury) (HCC) 01/06/2016  . Cellulitis of left lower extremity 12/15/2014  . PAF (paroxysmal atrial fibrillation) (HCC) 11/09/2014  . COPD exacerbation (HCC) 11/06/2014  . Neuropathic pain 11/06/2014  . Hypothyroidism 11/06/2014  . Hyperkalemia 11/06/2014  . Pedal edema 01/12/2014  . HTN (hypertension) 01/12/2014  . Upper airway cough syndrome 09/03/2013  . Barrett's esophagus 01/15/2013  . GERD (gastroesophageal reflux disease) 11/13/2011  . Short-segment Barrett's esophagus 11/13/2011  . COPD  II  01/08/2008    Past Surgical History:  Procedure Laterality Date  . BREAST BIOPSY Right Sept, 2012  . CHOLECYSTECTOMY    . COLONOSCOPY    . UPPER GASTROINTESTINAL ENDOSCOPY      OB History    Gravida Para Term Preterm AB Living             0   SAB TAB Ectopic Multiple Live Births                   Home Medications    Prior to Admission medications   Medication Sig Start Date End Date Taking? Authorizing Provider  acetaminophen (TYLENOL) 500 MG tablet Take 500-1,000 mg by mouth as needed for mild pain.  Yes Historical Provider, MD  albuterol (PROVENTIL HFA;VENTOLIN HFA) 108 (90 Base) MCG/ACT inhaler Inhale 2 puffs into the lungs 4 (four) times daily.    Yes Historical Provider, MD  apixaban (ELIQUIS) 5 MG TABS tablet Take 1 tablet (5 mg total) by mouth 2 (two) times daily. 11/13/14  Yes Kari Baars, MD  baclofen (LIORESAL) 20 MG tablet Take 20 mg by mouth 2 (two) times daily. 12/26/16  Yes Historical Provider, MD  cetirizine (ZYRTEC) 10 MG tablet Take 10 mg by mouth daily.   Yes Historical Provider, MD  cyclobenzaprine (FLEXERIL) 10 MG tablet  Take 10 mg by mouth 3 (three) times daily as needed for muscle spasms.   Yes Historical Provider, MD  diltiazem (CARDIZEM CD) 120 MG 24 hr capsule Take 1 capsule (120 mg total) by mouth daily. 01/26/16  Yes Laqueta Linden, MD  fluticasone furoate-vilanterol (BREO ELLIPTA) 200-25 MCG/INH AEPB Inhale 1 puff into the lungs daily.   Yes Historical Provider, MD  gabapentin (NEURONTIN) 300 MG capsule Take 300-900 mg by mouth at bedtime. restless leg syndrome   Yes Historical Provider, MD  ibuprofen (ADVIL,MOTRIN) 200 MG tablet Take 400 mg by mouth daily as needed for mild pain or moderate pain.   Yes Historical Provider, MD  levalbuterol (XOPENEX) 0.63 MG/3ML nebulizer solution Take 3 mLs (0.63 mg total) by nebulization every 6 (six) hours as needed for wheezing or shortness of breath. 11/13/14  Yes Kari Baars, MD  levothyroxine (SYNTHROID, LEVOTHROID) 137 MCG tablet Take 137 mcg by mouth daily before breakfast.   Yes Historical Provider, MD  Magnesium 250 MG TABS Take 2 tablets by mouth 2 (two) times daily.    Yes Historical Provider, MD  methylPREDNISolone (MEDROL DOSEPAK) 4 MG TBPK tablet Take 1 tablet by mouth daily as needed. For gout 08/28/16  Yes Historical Provider, MD  metoprolol tartrate (LOPRESSOR) 25 MG tablet TAKE 1/2 TABLET BY MOUTH TWICE DAILY 10/23/16  Yes Laqueta Linden, MD  omeprazole (PRILOSEC) 20 MG capsule TAKE ONE CAPSULE BY MOUTH DAILY 05/29/16  Yes Malissa Hippo, MD  potassium chloride SA (K-DUR,KLOR-CON) 20 MEQ tablet Take 1 tablet (20 mEq total) by mouth daily. 12/10/16  Yes Antoine Poche, MD  torsemide (DEMADEX) 20 MG tablet Take 3 tablets (60 mg total) by mouth 2 (two) times daily. (may take an extra  as needed) 12/13/16  Yes Laqueta Linden, MD  triamcinolone (NASACORT ALLERGY 24HR) 55 MCG/ACT AERO nasal inhaler Place 2 sprays into the nose daily as needed (for allergies).    Yes Historical Provider, MD  VOLTAREN 1 % GEL Apply 1 application topically daily as  needed (for pain).  08/25/14  Yes Historical Provider, MD  HYDROcodone-acetaminophen (NORCO/VICODIN) 5-325 MG tablet Take 1 tablet by mouth every 4 (four) hours as needed for moderate pain or severe pain. 12/27/16   Lavera Guise, MD    Family History Family History  Problem Relation Age of Onset  . Dementia Mother   . Lung cancer Father     smoked  . Hypertension Sister   . Diabetes Brother   . Obesity Sister   . Hypertension Sister   . Restless legs syndrome Sister   . Healthy Sister   . Lung cancer Maternal Uncle     Social History Social History  Substance Use Topics  . Smoking status: Former Smoker    Packs/day: 0.50    Years: 30.00    Types: Cigarettes    Start date: 09/24/1958    Quit  date: 11/13/1999  . Smokeless tobacco: Never Used  . Alcohol use No     Allergies   Ciprofloxacin; Doxycycline; Fish oil; Guaifenesin; Mucinex [guaifenesin er]; Sulfamethoxazole w/trimethoprim 800-160 [sulfamethoxazole-trimethoprim]; Uloric [febuxostat]; Adhesive [tape]; Allopurinol; Cefuroxime axetil; Metolazone; and Tramadol   Review of Systems Review of Systems  A complete 10 system review of systems was obtained and all systems are negative except as noted in the HPI and PMH.   Physical Exam Updated Vital Signs BP (!) 156/63 (BP Location: Right Arm)   Pulse 66   Temp 98 F (36.7 C) (Oral)   Resp 18   Ht 5' 1.5" (1.562 m)   Wt 210 lb (95.3 kg)   SpO2 100%   BMI 39.04 kg/m   Physical Exam  Physical Exam  Nursing note and vitals reviewed. Constitutional: Well developed, well nourished, non-toxic, and in no acute distress Head: Normocephalic and atraumatic.  Mouth/Throat: Oropharynx is clear and moist.  Neck: Normal range of motion. Neck supple.  Cardiovascular: Normal rate and regular rhythm.  +2 radial pulses bilaterally Pulmonary/Chest: Effort normal and breath sounds normal.  Abdominal: Soft. There is no tenderness. There is no rebound and no guarding.    Musculoskeletal: Normal range of motion. Tenderness to palpation of the mid to upper right thoracic paraspinal muscles. No midline TLS spine tenderness or deformities Neurological: Alert, no facial droop, fluent speech, moves all extremities symmetrically Skin: Skin is warm and dry.  Psychiatric: Cooperative  ED Treatments / Results  COORDINATION OF CARE: 9:04 PM Discussed treatment plan with pt at bedside and pt agreed to plan, which includes pain medication.   Labs (all labs ordered are listed, but only abnormal results are displayed) Labs Reviewed - No data to display  EKG  EKG Interpretation None       Radiology Dg Shoulder Right  Result Date: 12/27/2016 CLINICAL DATA:  Patient states right shoulder pain all across her shoulder blade with no known injury. She has spasms and contractions in the shoulder and they come and go about every minute. Hx of arthritis. EXAM: RIGHT SHOULDER - 2+ VIEW COMPARISON:  None. FINDINGS: No fracture or dislocation. The glenohumeral joint is well preserved. There are mild AC joint osteoarthritic changes. The bones are diffusely demineralized. Soft tissues are unremarkable. IMPRESSION: No fracture or dislocation.  Mild AC joint osteoarthritis. Electronically Signed   By: Amie Portland M.D.   On: 12/27/2016 19:46    Procedures Procedures (including critical care time)  Medications Ordered in ED Medications  morphine 4 MG/ML injection 4 mg (4 mg Intravenous Given 12/27/16 2149)     Initial Impression / Assessment and Plan / ED Course  I have reviewed the triage vital signs and the nursing notes.  Pertinent labs & imaging results that were available during my care of the patient were reviewed by me and considered in my medical decision making (see chart for details).     Presents with spasms in her right posterior shoulder and right upper back. Consistent with prior history of muscles spasms in this area. Pain reproduced with palpation of upper  thoracic right paraspinal muscles. No midline pain. No trauma. No neuro complaints or deficits. Treated with morphine with improvement in her pain significantly. To continue supportive care at home. Strict return and follow-up instructions reviewed. She expressed understanding of all discharge instructions and felt comfortable with the plan of care.   Final Clinical Impressions(s) / ED Diagnoses   Final diagnoses:  Muscle spasm  Upper back pain  New Prescriptions Discharge Medication List as of 12/27/2016 11:20 PM    START taking these medications   Details  HYDROcodone-acetaminophen (NORCO/VICODIN) 5-325 MG tablet Take 1 tablet by mouth every 4 (four) hours as needed for moderate pain or severe pain., Starting Thu 12/27/2016, Print       *I personally performed the services described in this documentation, which was scribed in my presence. The recorded information has been reviewed and is accurate.    Lavera Guise, MD 12/28/16 [phone]

## 2016-12-30 ENCOUNTER — Emergency Department (HOSPITAL_COMMUNITY): Payer: Medicare Other

## 2016-12-30 ENCOUNTER — Encounter (HOSPITAL_COMMUNITY): Payer: Self-pay | Admitting: *Deleted

## 2016-12-30 ENCOUNTER — Inpatient Hospital Stay (HOSPITAL_COMMUNITY)
Admission: EM | Admit: 2016-12-30 | Discharge: 2017-01-07 | DRG: 689 | Disposition: A | Discharge status: Skilled Nursing Facility | Payer: Medicare Other | Attending: Pulmonary Disease | Admitting: Pulmonary Disease

## 2016-12-30 DIAGNOSIS — R300 Dysuria: Secondary | ICD-10-CM

## 2016-12-30 DIAGNOSIS — K0889 Other specified disorders of teeth and supporting structures: Secondary | ICD-10-CM | POA: Diagnosis present

## 2016-12-30 DIAGNOSIS — R32 Unspecified urinary incontinence: Secondary | ICD-10-CM | POA: Diagnosis not present

## 2016-12-30 DIAGNOSIS — I480 Paroxysmal atrial fibrillation: Secondary | ICD-10-CM | POA: Diagnosis present

## 2016-12-30 DIAGNOSIS — G9340 Encephalopathy, unspecified: Secondary | ICD-10-CM | POA: Diagnosis not present

## 2016-12-30 DIAGNOSIS — M62830 Muscle spasm of back: Secondary | ICD-10-CM | POA: Diagnosis present

## 2016-12-30 DIAGNOSIS — E669 Obesity, unspecified: Secondary | ICD-10-CM | POA: Diagnosis present

## 2016-12-30 DIAGNOSIS — M545 Low back pain, unspecified: Secondary | ICD-10-CM

## 2016-12-30 DIAGNOSIS — K2270 Barrett's esophagus without dysplasia: Secondary | ICD-10-CM | POA: Diagnosis present

## 2016-12-30 DIAGNOSIS — E039 Hypothyroidism, unspecified: Secondary | ICD-10-CM | POA: Diagnosis present

## 2016-12-30 DIAGNOSIS — R739 Hyperglycemia, unspecified: Secondary | ICD-10-CM | POA: Diagnosis present

## 2016-12-30 DIAGNOSIS — L03116 Cellulitis of left lower limb: Secondary | ICD-10-CM | POA: Diagnosis present

## 2016-12-30 DIAGNOSIS — Z6841 Body Mass Index (BMI) 40.0 and over, adult: Secondary | ICD-10-CM

## 2016-12-30 DIAGNOSIS — I130 Hypertensive heart and chronic kidney disease with heart failure and stage 1 through stage 4 chronic kidney disease, or unspecified chronic kidney disease: Secondary | ICD-10-CM | POA: Diagnosis present

## 2016-12-30 DIAGNOSIS — Z79899 Other long term (current) drug therapy: Secondary | ICD-10-CM | POA: Diagnosis not present

## 2016-12-30 DIAGNOSIS — Z87891 Personal history of nicotine dependence: Secondary | ICD-10-CM | POA: Diagnosis not present

## 2016-12-30 DIAGNOSIS — I10 Essential (primary) hypertension: Secondary | ICD-10-CM | POA: Diagnosis present

## 2016-12-30 DIAGNOSIS — R0902 Hypoxemia: Secondary | ICD-10-CM | POA: Diagnosis present

## 2016-12-30 DIAGNOSIS — R296 Repeated falls: Secondary | ICD-10-CM | POA: Diagnosis present

## 2016-12-30 DIAGNOSIS — Z888 Allergy status to other drugs, medicaments and biological substances status: Secondary | ICD-10-CM

## 2016-12-30 DIAGNOSIS — I5032 Chronic diastolic (congestive) heart failure: Secondary | ICD-10-CM | POA: Diagnosis present

## 2016-12-30 DIAGNOSIS — K219 Gastro-esophageal reflux disease without esophagitis: Secondary | ICD-10-CM | POA: Diagnosis present

## 2016-12-30 DIAGNOSIS — R4182 Altered mental status, unspecified: Secondary | ICD-10-CM

## 2016-12-30 DIAGNOSIS — Z881 Allergy status to other antibiotic agents status: Secondary | ICD-10-CM

## 2016-12-30 DIAGNOSIS — M109 Gout, unspecified: Secondary | ICD-10-CM | POA: Diagnosis present

## 2016-12-30 DIAGNOSIS — N183 Chronic kidney disease, stage 3 (moderate): Secondary | ICD-10-CM | POA: Diagnosis present

## 2016-12-30 DIAGNOSIS — G9341 Metabolic encephalopathy: Secondary | ICD-10-CM | POA: Diagnosis present

## 2016-12-30 DIAGNOSIS — R4689 Other symptoms and signs involving appearance and behavior: Secondary | ICD-10-CM

## 2016-12-30 DIAGNOSIS — J449 Chronic obstructive pulmonary disease, unspecified: Secondary | ICD-10-CM | POA: Diagnosis present

## 2016-12-30 DIAGNOSIS — Z7901 Long term (current) use of anticoagulants: Secondary | ICD-10-CM

## 2016-12-30 DIAGNOSIS — R451 Restlessness and agitation: Secondary | ICD-10-CM | POA: Diagnosis not present

## 2016-12-30 DIAGNOSIS — N390 Urinary tract infection, site not specified: Secondary | ICD-10-CM | POA: Diagnosis present

## 2016-12-30 DIAGNOSIS — R531 Weakness: Secondary | ICD-10-CM | POA: Diagnosis present

## 2016-12-30 HISTORY — DX: Hepatomegaly, not elsewhere classified: R16.0

## 2016-12-30 LAB — CBC
HCT: 36.7 % (ref 36.0–46.0)
Hemoglobin: 12.3 g/dL (ref 12.0–15.0)
MCH: 32 pg (ref 26.0–34.0)
MCHC: 33.5 g/dL (ref 30.0–36.0)
MCV: 95.6 fL (ref 78.0–100.0)
PLATELETS: 181 10*3/uL (ref 150–400)
RBC: 3.84 MIL/uL — AB (ref 3.87–5.11)
RDW: 14.4 % (ref 11.5–15.5)
WBC: 10.5 10*3/uL (ref 4.0–10.5)

## 2016-12-30 LAB — COMPREHENSIVE METABOLIC PANEL
ALT: 40 U/L (ref 14–54)
ANION GAP: 11 (ref 5–15)
AST: 34 U/L (ref 15–41)
Albumin: 3.5 g/dL (ref 3.5–5.0)
Alkaline Phosphatase: 70 U/L (ref 38–126)
BUN: 20 mg/dL (ref 6–20)
CHLORIDE: 105 mmol/L (ref 101–111)
CO2: 29 mmol/L (ref 22–32)
CREATININE: 0.99 mg/dL (ref 0.44–1.00)
Calcium: 9.7 mg/dL (ref 8.9–10.3)
GFR calc Af Amer: >60 mL/min (ref >60)
GFR, EST NON AFRICAN AMERICAN: 55 mL/min — AB (ref >60)
Glucose, Bld: 161 mg/dL — ABNORMAL HIGH (ref 65–99)
POTASSIUM: 3.5 mmol/L (ref 3.5–5.1)
Sodium: 145 mmol/L (ref 135–145)
Total Bilirubin: 1.1 mg/dL (ref 0.3–1.2)
Total Protein: 6.6 g/dL (ref 6.5–8.1)

## 2016-12-30 LAB — URINALYSIS, ROUTINE W REFLEX MICROSCOPIC
Bilirubin Urine: NEGATIVE
GLUCOSE, UA: NEGATIVE mg/dL
KETONES UR: 20 mg/dL — AB
Nitrite: NEGATIVE
PROTEIN: NEGATIVE mg/dL
Specific Gravity, Urine: 1.014 (ref 1.005–1.030)
pH: 8 (ref 5.0–8.0)

## 2016-12-30 LAB — CBG MONITORING, ED: Glucose-Capillary: 138 mg/dL — ABNORMAL HIGH (ref 65–99)

## 2016-12-30 LAB — TSH: TSH: 4.192 u[IU]/mL (ref 0.350–4.500)

## 2016-12-30 LAB — TROPONIN I: Troponin I: <0.03 ng/mL (ref <0.03)

## 2016-12-30 MED ORDER — PANTOPRAZOLE SODIUM 40 MG PO TBEC
40.0000 mg | DELAYED_RELEASE_TABLET | Freq: Every day | ORAL | Status: DC
  Administered 2016-12-30 – 2016-12-31 (×2): 40 mg via ORAL
  Filled 2016-12-30 (×3): qty 1

## 2016-12-30 MED ORDER — ACETAMINOPHEN 325 MG PO TABS
650.0000 mg | ORAL_TABLET | Freq: Four times a day (QID) | ORAL | Status: DC | PRN
  Administered 2017-01-05 – 2017-01-07 (×4): 650 mg via ORAL
  Filled 2016-12-30 (×4): qty 2

## 2016-12-30 MED ORDER — POTASSIUM CHLORIDE CRYS ER 20 MEQ PO TBCR
20.0000 meq | EXTENDED_RELEASE_TABLET | Freq: Every day | ORAL | Status: DC
  Administered 2016-12-30 – 2016-12-31 (×2): 20 meq via ORAL
  Filled 2016-12-30 (×3): qty 1

## 2016-12-30 MED ORDER — FENTANYL CITRATE (PF) 100 MCG/2ML IJ SOLN
25.0000 ug | Freq: Once | INTRAMUSCULAR | Status: DC
  Filled 2016-12-30: qty 2

## 2016-12-30 MED ORDER — SODIUM CHLORIDE 0.9 % IV SOLN
3.0000 g | Freq: Once | INTRAVENOUS | Status: AC
  Administered 2016-12-30: 3 g via INTRAVENOUS
  Filled 2016-12-30: qty 3

## 2016-12-30 MED ORDER — GABAPENTIN 300 MG PO CAPS
300.0000 mg | ORAL_CAPSULE | Freq: Every day | ORAL | Status: DC
  Administered 2016-12-30: 300 mg via ORAL
  Filled 2016-12-30 (×2): qty 1

## 2016-12-30 MED ORDER — MAGNESIUM 200 MG PO TABS
2.0000 | ORAL_TABLET | Freq: Two times a day (BID) | ORAL | Status: DC
  Administered 2016-12-30: 400 mg via ORAL
  Filled 2016-12-30 (×2): qty 2
  Filled 2016-12-30: qty 1

## 2016-12-30 MED ORDER — ONDANSETRON HCL 4 MG PO TABS: 4.0000 mg | ORAL_TABLET | Freq: Four times a day (QID) | ORAL | Status: DC | PRN

## 2016-12-30 MED ORDER — METOPROLOL TARTRATE 25 MG PO TABS
12.5000 mg | ORAL_TABLET | Freq: Two times a day (BID) | ORAL | Status: DC
  Administered 2016-12-30 – 2016-12-31 (×3): 12.5 mg via ORAL
  Filled 2016-12-30 (×4): qty 1

## 2016-12-30 MED ORDER — SODIUM CHLORIDE 0.9 % IV BOLUS (SEPSIS)
1000.0000 mL | Freq: Once | INTRAVENOUS | Status: AC
  Administered 2016-12-30: 1000 mL via INTRAVENOUS

## 2016-12-30 MED ORDER — LEVOTHYROXINE SODIUM 25 MCG PO TABS
137.0000 ug | ORAL_TABLET | Freq: Every day | ORAL | Status: DC
  Administered 2016-12-31: 08:00:00 137 ug via ORAL
  Filled 2016-12-30 (×2): qty 1

## 2016-12-30 MED ORDER — BACLOFEN 20 MG PO TABS
20.0000 mg | ORAL_TABLET | Freq: Two times a day (BID) | ORAL | Status: DC
  Administered 2016-12-30 – 2016-12-31 (×2): 20 mg via ORAL
  Filled 2016-12-30 (×3): qty 1
  Filled 2016-12-30 (×2): qty 2
  Filled 2016-12-30 (×2): qty 1

## 2016-12-30 MED ORDER — DOCUSATE SODIUM 100 MG PO CAPS
100.0000 mg | ORAL_CAPSULE | Freq: Two times a day (BID) | ORAL | Status: DC
  Administered 2016-12-30 – 2016-12-31 (×3): 100 mg via ORAL
  Filled 2016-12-30 (×4): qty 1

## 2016-12-30 MED ORDER — ALBUTEROL SULFATE (2.5 MG/3ML) 0.083% IN NEBU
2.5000 mg | INHALATION_SOLUTION | RESPIRATORY_TRACT | Status: DC | PRN
Start: 2016-12-30 — End: 2017-01-07
  Administered 2017-01-03: 2.5 mg via RESPIRATORY_TRACT
  Filled 2016-12-30: qty 3

## 2016-12-30 MED ORDER — METHOCARBAMOL 1000 MG/10ML IJ SOLN
500.0000 mg | Freq: Four times a day (QID) | INTRAVENOUS | Status: DC | PRN
  Administered 2016-12-30: 500 mg via INTRAVENOUS
  Filled 2016-12-30 (×2): qty 5

## 2016-12-30 MED ORDER — DILTIAZEM HCL ER COATED BEADS 120 MG PO CP24
120.0000 mg | ORAL_CAPSULE | Freq: Every day | ORAL | Status: DC
  Administered 2016-12-30 – 2016-12-31 (×2): 120 mg via ORAL
  Filled 2016-12-30 (×4): qty 1

## 2016-12-30 MED ORDER — ALBUTEROL SULFATE (2.5 MG/3ML) 0.083% IN NEBU
2.5000 mg | INHALATION_SOLUTION | Freq: Four times a day (QID) | RESPIRATORY_TRACT | Status: DC
  Administered 2016-12-30: 2.5 mg via RESPIRATORY_TRACT
  Filled 2016-12-30: qty 3

## 2016-12-30 MED ORDER — DEXTROSE 5 % IV SOLN
INTRAVENOUS | Status: AC
  Filled 2016-12-30 (×2): qty 1

## 2016-12-30 MED ORDER — DEXTROSE 5 % IV SOLN
1.0000 g | Freq: Three times a day (TID) | INTRAVENOUS | Status: DC
  Administered 2016-12-30 – 2017-01-04 (×14): 1 g via INTRAVENOUS
  Filled 2016-12-30 (×15): qty 1

## 2016-12-30 MED ORDER — ACETAMINOPHEN 650 MG RE SUPP: 650.0000 mg | Freq: Four times a day (QID) | RECTAL | Status: DC | PRN

## 2016-12-30 MED ORDER — HYDROCODONE-ACETAMINOPHEN 5-325 MG PO TABS
1.0000 | ORAL_TABLET | ORAL | Status: DC | PRN
  Administered 2016-12-30: 1 via ORAL
  Filled 2016-12-30: qty 1

## 2016-12-30 MED ORDER — LACTATED RINGERS IV SOLN
INTRAVENOUS | Status: DC
  Administered 2016-12-30 – 2017-01-03 (×8): via INTRAVENOUS

## 2016-12-30 MED ORDER — TORSEMIDE 20 MG PO TABS
60.0000 mg | ORAL_TABLET | Freq: Two times a day (BID) | ORAL | Status: DC
  Administered 2016-12-30 – 2016-12-31 (×2): 60 mg via ORAL
  Filled 2016-12-30 (×5): qty 3

## 2016-12-30 MED ORDER — CLINDAMYCIN PHOSPHATE 600 MG/50ML IV SOLN
600.0000 mg | Freq: Three times a day (TID) | INTRAVENOUS | Status: DC
  Administered 2016-12-30 – 2017-01-07 (×24): 600 mg via INTRAVENOUS
  Filled 2016-12-30 (×27): qty 50

## 2016-12-30 MED ORDER — ONDANSETRON HCL 4 MG/2ML IJ SOLN
4.0000 mg | Freq: Four times a day (QID) | INTRAMUSCULAR | Status: DC | PRN
  Administered 2016-12-30: 4 mg via INTRAVENOUS
  Filled 2016-12-30: qty 2

## 2016-12-30 MED ORDER — LORATADINE 10 MG PO TABS
10.0000 mg | ORAL_TABLET | Freq: Every day | ORAL | Status: DC
  Administered 2016-12-30 – 2016-12-31 (×2): 10 mg via ORAL
  Filled 2016-12-30 (×3): qty 1

## 2016-12-30 MED ORDER — FENTANYL CITRATE (PF) 100 MCG/2ML IJ SOLN
INTRAMUSCULAR | Status: AC
  Filled 2016-12-30: qty 2

## 2016-12-30 MED ORDER — CLINDAMYCIN PHOSPHATE 600 MG/50ML IV SOLN
INTRAVENOUS | Status: AC
  Filled 2016-12-30: qty 100

## 2016-12-30 MED ORDER — METHOCARBAMOL 1000 MG/10ML IJ SOLN
INTRAMUSCULAR | Status: AC
  Filled 2016-12-30: qty 10

## 2016-12-30 MED ORDER — APIXABAN 5 MG PO TABS
5.0000 mg | ORAL_TABLET | Freq: Two times a day (BID) | ORAL | Status: DC
  Administered 2016-12-30 – 2017-01-07 (×15): 5 mg via ORAL
  Filled 2016-12-30 (×17): qty 1

## 2016-12-30 MED ORDER — FLUTICASONE FUROATE-VILANTEROL 200-25 MCG/INH IN AEPB
1.0000 | INHALATION_SPRAY | Freq: Every day | RESPIRATORY_TRACT | Status: DC
  Administered 2017-01-02 – 2017-01-07 (×6): 1 via RESPIRATORY_TRACT
  Filled 2016-12-30 (×2): qty 28

## 2016-12-30 NOTE — H&P (Signed)
History and Physical    Yolanda [name] ZOX:[id number] DOB: 10/29/1940 DOA: 12/30/2016  PCP: Fredirick Maudlin, MD Consultants:  Dema Severin - dentist; Purvis Sheffield - cardiology; Hilda Lias - orthopedics; Rehman - GI; Detterding - nephrology Patient coming from: home - lives alone; NOK: sister, Sallye Ober, [phone]  Chief Complaint: AMS  HPI: Yolanda [name] is a 76 y.o. female with medical history significant of Afib on Eliquis, Stage 3 CKD, hypothyroidism, GERD, HTN, diastolic heart failure, and COPD presenting with AMS.  The patient reports that she first noticed a jagged edge on her tooth on Thursday night.  However, her sister reports that she has actually been seeing a dentist about this issue and has had a procedure for it but it has continued to bother her.  Her sister then reports that actually on Thursday night, she was having problems with her back spasms and came to the ER here; she had previously received Flexeril and Baclofen from Dr. Juanetta Gosling for this issue and sent home with those and Vicodin.  No medication since yesterday but still having spasms.  Overnight, she got very "loopy", confused, usually very lucid.  Sometimes comes and goes but it has intensified since it started.  Repeating herself and "it's backtracking and it's just messed up."  Still having back spasms.  No urinary symptoms per the patient.  "They took care of my pee problem."  However, her sister reports that she did wet the bed last night and wouldn't wear underwear today.    Allergies: lots of them.   ED Course: Negative head CT, UA "is grossly infected" - given IV Unasyn  Review of Systems: As per HPI; otherwise 10 point review of systems reviewed and negative. Somewhat difficult to obtain so may not be completely accurate.  Ambulatory Status:  Usually ambulates without assistance  Past Medical History:  Diagnosis Date  . Barrett esophagus   . Bronchitis   . Cellulitis   . COPD (chronic obstructive pulmonary disease)  (HCC)   . Diastolic heart failure (HCC)   . Essential hypertension   . GERD (gastroesophageal reflux disease)   . Hepatomegaly    ???  . Hypothyroidism   . PAF (paroxysmal atrial fibrillation) (HCC)    Eliquis  . Stage III chronic kidney disease     Past Surgical History:  Procedure Laterality Date  . BREAST BIOPSY Right Sept, 2012  . CHOLECYSTECTOMY    . COLONOSCOPY    . UPPER GASTROINTESTINAL ENDOSCOPY      Social History   Social History  . Marital status: Widowed    Spouse name: N/A  . Number of children: N/A  . Years of education: N/A   Occupational History  . Not on file.   Social History Main Topics  . Smoking status: Former Smoker    Packs/day: 0.50    Years: 30.00    Types: Cigarettes    Start date: 09/24/1958    Quit date: 11/13/1999  . Smokeless tobacco: Never Used  . Alcohol use No  . Drug use: No  . Sexual activity: Not Currently   Other Topics Concern  . Not on file   Social History Narrative  . No narrative on file    Allergies  Allergen Reactions  . Ciprofloxacin Shortness Of Breath    REACTION: sob,tachycardia  . Doxycycline Nausea Only    Also experienced diarrhea   . Fish Oil     Patient face drew to the side,Bells Palsey  . Guaifenesin Shortness Of Breath  REACTION: sob,tachycardia  . Mucinex [Guaifenesin Er] Shortness Of Breath  . Sulfamethoxazole W/Trimethoprim 800-160 [Sulfamethoxazole-Trimethoprim] Nausea Only    Also lack of appetite   . Uloric [Febuxostat] Swelling    No urination  . Adhesive [Tape] Other (See Comments)    Causes blisters on skin  . Allopurinol Other (See Comments)    Couldn't urinate  . Cefuroxime Axetil Swelling    Swelling all over body-per patient she was hospitalized as a result of taking this medication  . Metolazone Nausea Only    Swelling   . Tramadol     insomnia    Family History  Problem Relation Age of Onset  . Dementia Mother   . Lung cancer Father     smoked  . Hypertension Sister    . Diabetes Brother   . Obesity Sister   . Hypertension Sister   . Restless legs syndrome Sister   . Healthy Sister   . Lung cancer Maternal Uncle     Prior to Admission medications   Medication Sig Start Date End Date Taking? Authorizing Provider  acetaminophen (TYLENOL) 500 MG tablet Take 500-1,000 mg by mouth as needed for mild pain.     Historical Provider, MD  albuterol (PROVENTIL HFA;VENTOLIN HFA) 108 (90 Base) MCG/ACT inhaler Inhale 2 puffs into the lungs 4 (four) times daily.     Historical Provider, MD  apixaban (ELIQUIS) 5 MG TABS tablet Take 1 tablet (5 mg total) by mouth 2 (two) times daily. 11/13/14   Kari Baars, MD  baclofen (LIORESAL) 20 MG tablet Take 20 mg by mouth 2 (two) times daily. 12/26/16   Historical Provider, MD  cetirizine (ZYRTEC) 10 MG tablet Take 10 mg by mouth daily.    Historical Provider, MD  cyclobenzaprine (FLEXERIL) 10 MG tablet Take 10 mg by mouth 3 (three) times daily as needed for muscle spasms.    Historical Provider, MD  diltiazem (CARDIZEM CD) 120 MG 24 hr capsule Take 1 capsule (120 mg total) by mouth daily. 01/26/16   Laqueta Linden, MD  fluticasone furoate-vilanterol (BREO ELLIPTA) 200-25 MCG/INH AEPB Inhale 1 puff into the lungs daily.    Historical Provider, MD  gabapentin (NEURONTIN) 300 MG capsule Take 300-900 mg by mouth at bedtime. restless leg syndrome    Historical Provider, MD  HYDROcodone-acetaminophen (NORCO/VICODIN) 5-325 MG tablet Take 1 tablet by mouth every 4 (four) hours as needed for moderate pain or severe pain. 12/27/16   Lavera Guise, MD  ibuprofen (ADVIL,MOTRIN) 200 MG tablet Take 400 mg by mouth daily as needed for mild pain or moderate pain.    Historical Provider, MD  levalbuterol Pauline Aus) 0.63 MG/3ML nebulizer solution Take 3 mLs (0.63 mg total) by nebulization every 6 (six) hours as needed for wheezing or shortness of breath. 11/13/14   Kari Baars, MD  levothyroxine (SYNTHROID, LEVOTHROID) 137 MCG tablet Take 137 mcg  by mouth daily before breakfast.    Historical Provider, MD  Magnesium 250 MG TABS Take 2 tablets by mouth 2 (two) times daily.     Historical Provider, MD  methylPREDNISolone (MEDROL DOSEPAK) 4 MG TBPK tablet Take 1 tablet by mouth daily as needed. For gout 08/28/16   Historical Provider, MD  metoprolol tartrate (LOPRESSOR) 25 MG tablet TAKE 1/2 TABLET BY MOUTH TWICE DAILY 10/23/16   Laqueta Linden, MD  omeprazole (PRILOSEC) 20 MG capsule TAKE ONE CAPSULE BY MOUTH DAILY 05/29/16   Malissa Hippo, MD  potassium chloride SA (K-DUR,KLOR-CON) 20 MEQ tablet Take 1  tablet (20 mEq total) by mouth daily. 12/10/16   Antoine Poche, MD  torsemide (DEMADEX) 20 MG tablet Take 3 tablets (60 mg total) by mouth 2 (two) times daily. (may take an extra  as needed) 12/13/16   Laqueta Linden, MD  triamcinolone (NASACORT ALLERGY 24HR) 55 MCG/ACT AERO nasal inhaler Place 2 sprays into the nose daily as needed (for allergies).     Historical Provider, MD  VOLTAREN 1 % GEL Apply 1 application topically daily as needed (for pain).  08/25/14   Historical Provider, MD    Physical Exam: Vitals:   12/30/16 1700 12/30/16 1800 12/30/16 1850 12/30/16 1902  BP: 100/88 (!) 150/76  (!) 146/65  Pulse:  (!) 115  (!) 106  Resp: (!) 32 (!) 24  (!) 21  Temp:    98.3 F (36.8 C)  TempSrc:    Oral  SpO2:  95%  97%  Weight:   96.1 kg (211 lb 14.4 oz)   Height:    (1.575 m)      General: Appears calm and comfortable and is NAD, but she is inappropriate (see below) Eyes:  PERRL, EOMI, normal lids, iris ENT:  grossly normal hearing, lips & tongue, mildly dry mm.  There is a jagged dental protrusion in the left medial gum posterior to the last teeth. Neck:  no LAD, masses or thyromegaly Cardiovascular:  Irregularly irregular, mild tachycardia, no m/r/g..  Respiratory:  CTA bilaterally, no w/r/r. Normal respiratory effort. Abdomen:  soft, ntnd, NABS Skin:  Erythema along left anterior tibia with surrounding  warmth and TTP, mild L>R pedal edema Musculoskeletal:  grossly normal tone BUE/BLE, good ROM, no bony abnormality; intermittently thrashes about with back spasms Psychiatric:  Eccentric affect, joking inappropriately, unable to follow conversation but very loquacious Neurologic:  CN 2-12 grossly intact, moves all extremities in coordinated fashion, sensation intact  Labs on Admission: I have personally reviewed following labs and imaging studies  CBC:  Recent Labs Lab 12/30/16 1336  WBC 10.5  HGB 12.3  HCT 36.7  MCV 95.6  PLT 181   Basic Metabolic Panel:  Recent Labs Lab 12/30/16 1336  NA 145  K 3.5  CL 105  CO2 29  GLUCOSE 161*  BUN 20  CREATININE 0.99  CALCIUM 9.7   GFR: Estimated Creatinine Clearance: 54.7 mL/min (by C-G formula based on SCr of 0.99 mg/dL). Liver Function Tests:  Recent Labs Lab 12/30/16 1336  AST 34  ALT 40  ALKPHOS 70  BILITOT 1.1  PROT 6.6  ALBUMIN 3.5   No results for input(s): LIPASE, AMYLASE in the last 168 hours. No results for input(s): AMMONIA in the last 168 hours. Coagulation Profile: No results for input(s): INR, PROTIME in the last 168 hours. Cardiac Enzymes:  Recent Labs Lab 12/30/16 1336  TROPONINI <0.03   BNP (last 3 results) No results for input(s): PROBNP in the last 8760 hours. HbA1C: No results for input(s): HGBA1C in the last 72 hours. CBG:  Recent Labs Lab 12/30/16 1403  GLUCAP 138*   Lipid Profile: No results for input(s): CHOL, HDL, LDLCALC, TRIG, CHOLHDL, LDLDIRECT in the last 72 hours. Thyroid Function Tests: No results for input(s): TSH, T4TOTAL, FREET4, T3FREE, THYROIDAB in the last 72 hours. Anemia Panel: No results for input(s): VITAMINB12, FOLATE, FERRITIN, TIBC, IRON, RETICCTPCT in the last 72 hours. Urine analysis:    Component Value Date/Time   COLORURINE YELLOW 12/30/2016 1537   APPEARANCEUR CLEAR 12/30/2016 1537   LABSPEC 1.014 12/30/2016 1537  PHURINE 8.0 12/30/2016 1537    GLUCOSEU NEGATIVE 12/30/2016 1537   HGBUR SMALL (A) 12/30/2016 1537   BILIRUBINUR NEGATIVE 12/30/2016 1537   KETONESUR 20 (A) 12/30/2016 1537   PROTEINUR NEGATIVE 12/30/2016 1537   NITRITE NEGATIVE 12/30/2016 1537   LEUKOCYTESUR SMALL (A) 12/30/2016 1537    Creatinine Clearance: Estimated Creatinine Clearance: 54.7 mL/min (by C-G formula based on SCr of 0.99 mg/dL).  Sepsis Labs: (procalcitonin:4,lacticidven:4) )No results found for this or any previous visit (from the past 240 hour(s)).   Radiological Exams on Admission: Dg Chest 2 View  Result Date: 12/30/2016 CLINICAL DATA:  Weakness, disoriented EXAM: CHEST  2 VIEW COMPARISON:  09/02/2016 FINDINGS: Stable cardiomegaly without edema or CHF. No focal pneumonia, collapse or consolidation. No significant effusion or pneumothorax. Trachea is midline. Atherosclerosis noted of the aorta. Monitor leads overlie the chest. Bones are osteopenic. Degenerative changes noted spine. Obese body habitus evident. IMPRESSION: Stable chest exam. No superimposed acute process or interval change. Cardiomegaly without CHF Thoracic aortic atherosclerosis Electronically Signed   By: Judie Petit.  Shick M.D.   On: 12/30/2016 14:45   Ct Head Wo Contrast  Result Date: 12/30/2016 CLINICAL DATA:  Altered mental status, on Eliquis EXAM: CT HEAD WITHOUT CONTRAST TECHNIQUE: Contiguous axial images were obtained from the base of the skull through the vertex without intravenous contrast. COMPARISON:  None. FINDINGS: Brain: No evidence of acute infarction, hemorrhage, hydrocephalus, extra-axial collection or mass lesion/mass effect. Mild age related atrophy. Mild subcortical white matter and periventricular small vessel ischemic changes. Vascular: Intracranial atherosclerosis. Skull: Normal. Negative for fracture or focal lesion. Sinuses/Orbits: The visualized paranasal sinuses are essentially clear. The mastoid air cells are unopacified. Other: None. IMPRESSION: No evidence  of acute intracranial abnormality. Mild atrophy with small vessel ischemic changes. Electronically Signed   By: Charline Bills M.D.   On: 12/30/2016 17:08    EKG: Independently reviewed.  NSR with rate 99;, incomplete RBBB, no evidence of acute ischemia  Assessment/Plan Principal Problem:   Acute encephalopathy Active Problems:   Hypothyroidism   PAF (paroxysmal atrial fibrillation) (HCC)   Cellulitis of left lower extremity   Chronic anticoagulation   Acute lower UTI   Pain, dental   Spasm of back muscles   Hyperglycemia   Acute encephalopathy -Currently thought to be related to infection, with most likely sources being UTI (see below) or cellulitis (see below) -Will treat with empiric antibiotics to cover both possible sources -Blood and urine cultures have been ordered -After discussion with pharmacy, to avoid patient's multiple drug allergies and yet also try to avoid broad spectrum antibiotics unnecessarily, will treat with Aztreonam and Clindamycin -Of note, she did receive a dose of Unasyn in the ER and so continued use of Unasyn or Zosyn would not have been unreasonable, but she does have a reported h/o cephalosporin allergy causing need for hospitalization -If she does not improve with the current treatment, she likely needs LP to evaluate for encephalitis (does not appear ill enough to raise concern for meningitis, and has no fever or leukocytosis). -Additionally, her dental problem does not appear to be infected or abscessed but this would also be a consideration (although Clindamycin should be good coverage for this, as well) -Finally, her back spasms are thought to be just that, but if other cause cannot be determined for AMS then further imaging of her back may need to be considered.  UTI -UA : rare bacteria, small Hgb, 20 ketones, small LE, negative nitrite, TNTC WBC, +mucus -WBC 10.5 -This is  not overly concerning for fulminant UTI that would be severe enough to  cause AMS -However, her family reports urinary incontinence which is out of character for the patient and also unwillingness to wear underwear -Perhaps these symptoms in conjunction with the mildly abnormal UA are suggestive enough of UTI -Additionally, she does have marked back spasms and so perhaps she actually has a pyelonephritis as the unifying diagnosis -Will treat with Aztreonam  Cellulitis  -LLE cellulitis appears to be present on exam -She has a h/o this in the past -She has never had AMS with cellulitis, but perhaps this is a contributing factor  -Will treat with Clindamycin, which she has taken without difficulty multiple times in the past  PAF on anticoagulation -CHA2DS2-VASc score is 4 (grade 2 diastolic dysfunction, HTN, female, age) with an estimated stroke rate of 4%/year -Continue Eliquis -Rate control with Cardizem and Metoprolol, will continue -Mild tachycardia at the time of admission, possible related to infection and/or AMS  Dental pain -She does have an unusual bony protuberance erupting through the gumline along the left posterior medial gum -There does not appear to be poor dental hygiene, erythema, or obvious abscess -However, it does appear to have sharp edges and likely is uncomfortable -She may need inpatient vs. Outpatient dental assistance with this issue -I am reluctant to give Ambesol or other topical numbing because it may make her more likely to suffer injury -Will continue Vicodin prn pain  Hypothyroidism -Check TSH -Continue Synthroid at current dose for now  Back spasms -May be idiopathic or may be associated with pyelo, as above -Will give IV Robaxin to see if this helps with pain control -prn Vicodin  Hyperglycemia -Glucose 161, 138 -May be stress response -Will follow with fasting AM labs   DVT prophylaxis: Eliquis Code Status:  Full - confirmed with patient/family Family Communication: Sisters present throughout evaluation    Disposition Plan:  Home once clinically improved Consults called: None  Admission status: Admit - It is my clinical opinion that admission to INPATIENT is reasonable and necessary because this patient will require at least 2 midnights in the hospital to treat this condition based on the medical complexity of the problems presented.  Given the aforementioned information, the predictability of an adverse outcome is felt to be significant.    Jonah Blue MD [facility]  If 7PM-7AM, please contact night-coverage [email or web address] Password [email or web address]  12/30/2016, 8:11 PM

## 2016-12-30 NOTE — ED Notes (Signed)
Pt stable and ready for transfer to [facility].  Report given to Teena Dunk, RN

## 2016-12-30 NOTE — Progress Notes (Signed)
Pharmacy Antibiotic Note  Yolanda [name] is a 76 y.o. female admitted on 12/30/2016 with cellulitis and UTI.  Pharmacy has been consulted for Aztreonam dosing.  Plan: Aztreonam 1gm IV q8h Also on, Clindamycin  IV q8h F/U cxs and clinical progress Monitor V/S and labs  Height:  (157.5 cm) Weight: 211 lb 14.4 oz (96.1 kg) IBW/kg (Calculated) : 50.1  Temp (24hrs), Avg:98.4 F (36.9 C), Min:98.4 F (36.9 C), Max:98.4 F (36.9 C)   Recent Labs Lab 12/30/16 1336  WBC 10.5  CREATININE 0.99    Estimated Creatinine Clearance: 54.7 mL/min (by C-G formula based on SCr of 0.99 mg/dL).    Allergies  Allergen Reactions  . Ciprofloxacin Shortness Of Breath    REACTION: sob,tachycardia  . Doxycycline Nausea Only    Also experienced diarrhea   . Fish Oil     Patient face drew to the side,Bells Palsey  . Guaifenesin Shortness Of Breath    REACTION: sob,tachycardia  . Mucinex [Guaifenesin Er] Shortness Of Breath  . Sulfamethoxazole W/Trimethoprim 800-160 [Sulfamethoxazole-Trimethoprim] Nausea Only    Also lack of appetite   . Uloric [Febuxostat] Swelling    No urination  . Adhesive [Tape] Other (See Comments)    Causes blisters on skin  . Allopurinol Other (See Comments)    Couldn't urinate  . Cefuroxime Axetil Swelling    Swelling all over body-per patient she was hospitalized as a result of taking this medication  . Metolazone Nausea Only    Swelling   . Tramadol     insomnia    Antimicrobials this admission: Aztreonam 4/8 >>  Clindamycin 4/8 >>  Unasyn 3gm IV given in ED  Dose adjustments this admission: N/A  Thank you for allowing pharmacy to be a part of this patient's care.  Elder Cyphers, BS Pharm D, [place] Clinical Pharmacist Pager [phone] 12/30/2016 6:59 PM

## 2016-12-30 NOTE — ED Provider Notes (Signed)
[facility]-EMERGENCY DEPT Provider Note   CSN: [id number] Arrival date & time: 12/30/16  1320  By signing my name below, I, Arianna Nassar, attest that this documentation has been prepared under the direction and in the presence of Donnetta Hutching, MD.  Electronically Signed: Octavia Heir, ED Scribe. 12/30/16. 1:42 PM.   History   Chief Complaint Chief Complaint  Patient presents with  . Altered Mental Status   The history is provided by the patient and the EMS personnel. No language interpreter was used.   HPI Comments: Yolanda [name] is a 76 y.o. female who has a PMhx of CHF, COPD, GERD, hypothyroidism, PAF, HTN, hyperkalemia and CKDIII presents to the Emergency Department by EMS complaining of recurrent right posterior shoulder pain and mid thoracic back pain s/p a fall that occurred this morning. Pt also appears slightly confused noting that she does not know what the current day is. Pt was seen and evaluated on 12/27/16 for intermittent, right posterior shoulder pain and was prescribed flexeril. which appears similar in presentation. Pt lives at home by herself but reports she has "helpers" come into her home for assistance. She is currently on Eliquis and is compliant. She denies leg pain or any other complaints.  PCP: Fredirick Maudlin, MD  Past Medical History:  Diagnosis Date  . Barrett esophagus   . Bronchitis   . Cellulitis   . COPD (chronic obstructive pulmonary disease) (HCC)   . Diastolic heart failure (HCC)   . Essential hypertension   . GERD (gastroesophageal reflux disease)   . Hepatomegaly    ???  . Hypothyroidism   . PAF (paroxysmal atrial fibrillation) (HCC)    Eliquis  . Stage III chronic kidney disease     Patient Active Problem List   Diagnosis Date Noted  . Acute encephalopathy 12/30/2016  . Gout attack 09/07/2016  . Chronic anticoagulation 02/02/2016  . Chronic edema 02/02/2016  . Obesity 02/02/2016  . Sleep apnea 02/02/2016  . RBBB 02/02/2016  . Acute  on chronic diastolic heart failure (HCC) 01/13/2016  . Hyponatremia 01/06/2016  . AKI (acute kidney injury) (HCC) 01/06/2016  . Cellulitis of left lower extremity 12/15/2014  . PAF (paroxysmal atrial fibrillation) (HCC) 11/09/2014  . COPD exacerbation (HCC) 11/06/2014  . Neuropathic pain 11/06/2014  . Hypothyroidism 11/06/2014  . Hyperkalemia 11/06/2014  . Pedal edema 01/12/2014  . HTN (hypertension) 01/12/2014  . Upper airway cough syndrome 09/03/2013  . Barrett's esophagus 01/15/2013  . GERD (gastroesophageal reflux disease) 11/13/2011  . Short-segment Barrett's esophagus 11/13/2011  . COPD  II  01/08/2008    Past Surgical History:  Procedure Laterality Date  . BREAST BIOPSY Right Sept, 2012  . CHOLECYSTECTOMY    . COLONOSCOPY    . UPPER GASTROINTESTINAL ENDOSCOPY      OB History    Gravida Para Term Preterm AB Living             0   SAB TAB Ectopic Multiple Live Births                   Home Medications    Prior to Admission medications   Medication Sig Start Date End Date Taking? Authorizing Provider  acetaminophen (TYLENOL) 500 MG tablet Take 500-1,000 mg by mouth as needed for mild pain.     Historical Provider, MD  albuterol (PROVENTIL HFA;VENTOLIN HFA) 108 (90 Base) MCG/ACT inhaler Inhale 2 puffs into the lungs 4 (four) times daily.     Historical Provider, MD  apixaban Everlene Balls) 5  MG TABS tablet Take 1 tablet (5 mg total) by mouth 2 (two) times daily. 11/13/14   Kari Baars, MD  baclofen (LIORESAL) 20 MG tablet Take 20 mg by mouth 2 (two) times daily. 12/26/16   Historical Provider, MD  cetirizine (ZYRTEC) 10 MG tablet Take 10 mg by mouth daily.    Historical Provider, MD  cyclobenzaprine (FLEXERIL) 10 MG tablet Take 10 mg by mouth 3 (three) times daily as needed for muscle spasms.    Historical Provider, MD  diltiazem (CARDIZEM CD) 120 MG 24 hr capsule Take 1 capsule (120 mg total) by mouth daily. 01/26/16   Laqueta Linden, MD  fluticasone furoate-vilanterol  (BREO ELLIPTA) 200-25 MCG/INH AEPB Inhale 1 puff into the lungs daily.    Historical Provider, MD  gabapentin (NEURONTIN) 300 MG capsule Take 300-900 mg by mouth at bedtime. restless leg syndrome    Historical Provider, MD  HYDROcodone-acetaminophen (NORCO/VICODIN) 5-325 MG tablet Take 1 tablet by mouth every 4 (four) hours as needed for moderate pain or severe pain. 12/27/16   Lavera Guise, MD  ibuprofen (ADVIL,MOTRIN) 200 MG tablet Take 400 mg by mouth daily as needed for mild pain or moderate pain.    Historical Provider, MD  levalbuterol Pauline Aus) 0.63 MG/3ML nebulizer solution Take 3 mLs (0.63 mg total) by nebulization every 6 (six) hours as needed for wheezing or shortness of breath. 11/13/14   Kari Baars, MD  levothyroxine (SYNTHROID, LEVOTHROID) 137 MCG tablet Take 137 mcg by mouth daily before breakfast.    Historical Provider, MD  Magnesium 250 MG TABS Take 2 tablets by mouth 2 (two) times daily.     Historical Provider, MD  methylPREDNISolone (MEDROL DOSEPAK) 4 MG TBPK tablet Take 1 tablet by mouth daily as needed. For gout 08/28/16   Historical Provider, MD  metoprolol tartrate (LOPRESSOR) 25 MG tablet TAKE 1/2 TABLET BY MOUTH TWICE DAILY 10/23/16   Laqueta Linden, MD  omeprazole (PRILOSEC) 20 MG capsule TAKE ONE CAPSULE BY MOUTH DAILY 05/29/16   Malissa Hippo, MD  potassium chloride SA (K-DUR,KLOR-CON) 20 MEQ tablet Take 1 tablet (20 mEq total) by mouth daily. 12/10/16   Antoine Poche, MD  torsemide (DEMADEX) 20 MG tablet Take 3 tablets (60 mg total) by mouth 2 (two) times daily. (may take an extra  as needed) 12/13/16   Laqueta Linden, MD  triamcinolone (NASACORT ALLERGY 24HR) 55 MCG/ACT AERO nasal inhaler Place 2 sprays into the nose daily as needed (for allergies).     Historical Provider, MD  VOLTAREN 1 % GEL Apply 1 application topically daily as needed (for pain).  08/25/14   Historical Provider, MD    Family History Family History  Problem Relation Age of Onset  .  Dementia Mother   . Lung cancer Father     smoked  . Hypertension Sister   . Diabetes Brother   . Obesity Sister   . Hypertension Sister   . Restless legs syndrome Sister   . Healthy Sister   . Lung cancer Maternal Uncle     Social History Social History  Substance Use Topics  . Smoking status: Former Smoker    Packs/day: 0.50    Years: 30.00    Types: Cigarettes    Start date: 09/24/1958    Quit date: 11/13/1999  . Smokeless tobacco: Never Used  . Alcohol use No     Allergies   Ciprofloxacin; Doxycycline; Fish oil; Guaifenesin; Mucinex [guaifenesin er]; Sulfamethoxazole w/trimethoprim 800-160 [sulfamethoxazole-trimethoprim]; Uloric [febuxostat]; Adhesive [  tape]; Allopurinol; Cefuroxime axetil; Metolazone; and Tramadol   Review of Systems Review of Systems  A complete 10 system review of systems was obtained and all systems are negative except as noted in the HPI and PMH.   Physical Exam Updated Vital Signs BP (!) 150/76   Pulse (!) 115   Temp 98.4 F (36.9 C) (Oral)   Resp (!) 24   Ht 5' 1.5" (1.562 m)   Wt 210 lb (95.3 kg)   SpO2 95%   BMI 39.04 kg/m   Physical Exam  Constitutional: She appears well-developed and well-nourished.  HENT:  Head: Normocephalic and atraumatic.  Eyes: Conjunctivae are normal.  Neck: Neck supple.  Cardiovascular: Normal rate and regular rhythm.   Pulmonary/Chest: Effort normal and breath sounds normal.  Abdominal: Soft. Bowel sounds are normal.  Musculoskeletal: Normal range of motion. She exhibits tenderness.  Tenderness to the right of the spine about T8-T9 area, moves all extremities but sluggishly   Neurological: She is alert.  Pt oriented to place and name, but not to time.  Skin: Skin is warm and dry. There is erythema.  Area of erythema about 6 inches in diameter on left anterior tibia area  Psychiatric: She has a normal mood and affect. Her behavior is normal.  Answers questions slowly but appears slightly confused    Nursing note and vitals reviewed.    ED Treatments / Results  DIAGNOSTIC STUDIES: Oxygen Saturation is 95% on RA, adequate by my interpretation.  COORDINATION OF CARE:  1:42 PM Discussed treatment plan with pt at bedside and pt agreed to plan.  Labs (all labs ordered are listed, but only abnormal results are displayed) Labs Reviewed  COMPREHENSIVE METABOLIC PANEL - Abnormal; Notable for the following:       Result Value   Glucose, Bld 161 (*)    GFR calc non Af Amer 55 (*)    All other components within normal limits  CBC - Abnormal; Notable for the following:    RBC 3.84 (*)    All other components within normal limits  URINALYSIS, ROUTINE W REFLEX MICROSCOPIC - Abnormal; Notable for the following:    Hgb urine dipstick SMALL (*)    Ketones, ur 20 (*)    Leukocytes, UA SMALL (*)    Bacteria, UA RARE (*)    Squamous Epithelial / LPF 0-5 (*)    All other components within normal limits  CBG MONITORING, ED - Abnormal; Notable for the following:    Glucose-Capillary 138 (*)    All other components within normal limits  URINE CULTURE  TROPONIN I    EKG  EKG Interpretation  Date/Time:  Sunday December 30 2016 13:24:16 EDT Ventricular Rate:  99 PR Interval:    QRS Duration: 115 QT Interval:  355 QTC Calculation: 456 R Axis:   -72 Text Interpretation:  Sinus rhythm Incomplete right bundle branch block Low voltage, precordial leads Confirmed by Nezzie Manera  MD, Jahniah Pallas ([id number]) on 12/30/2016 2:50:11 PM       Radiology Dg Chest 2 View  Result Date: 12/30/2016 CLINICAL DATA:  Weakness, disoriented EXAM: CHEST  2 VIEW COMPARISON:  09/02/2016 FINDINGS: Stable cardiomegaly without edema or CHF. No focal pneumonia, collapse or consolidation. No significant effusion or pneumothorax. Trachea is midline. Atherosclerosis noted of the aorta. Monitor leads overlie the chest. Bones are osteopenic. Degenerative changes noted spine. Obese body habitus evident. IMPRESSION: Stable chest exam. No  superimposed acute process or interval change. Cardiomegaly without CHF Thoracic aortic atherosclerosis Electronically Signed  By: Osvaldo Shipper M.D.   On: 12/30/2016 14:45   Ct Head Wo Contrast  Result Date: 12/30/2016 CLINICAL DATA:  Altered mental status, on Eliquis EXAM: CT HEAD WITHOUT CONTRAST TECHNIQUE: Contiguous axial images were obtained from the base of the skull through the vertex without intravenous contrast. COMPARISON:  None. FINDINGS: Brain: No evidence of acute infarction, hemorrhage, hydrocephalus, extra-axial collection or mass lesion/mass effect. Mild age related atrophy. Mild subcortical white matter and periventricular small vessel ischemic changes. Vascular: Intracranial atherosclerosis. Skull: Normal. Negative for fracture or focal lesion. Sinuses/Orbits: The visualized paranasal sinuses are essentially clear. The mastoid air cells are unopacified. Other: None. IMPRESSION: No evidence of acute intracranial abnormality. Mild atrophy with small vessel ischemic changes. Electronically Signed   By: Charline Bills M.D.   On: 12/30/2016 17:08    Procedures Procedures (including critical care time)  Medications Ordered in ED Medications  fentaNYL (SUBLIMAZE) injection 25 mcg (not administered)  sodium chloride 0.9 % bolus 1,000 mL (0 mLs Intravenous Stopped 12/30/16 1608)  Ampicillin-Sulbactam (UNASYN) 3 g in sodium chloride 0.9 % 100 mL IVPB (3 g Intravenous New Bag/Given 12/30/16 1709)     Initial Impression / Assessment and Plan / ED Course  I have reviewed the triage vital signs and the nursing notes.  Pertinent labs & imaging results that were available during my care of the patient were reviewed by me and considered in my medical decision making (see chart for details).     Daughter's report a normally high functioning woman. She has altered mental status in the past 24 hours. CT head shows no acute changes. Urinalysis is grossly infected. Will start IV antibiotics. Admit  to general medicine.  Final Clinical Impressions(s) / ED Diagnoses   Final diagnoses:  Altered mental status, unspecified altered mental status type  Urinary tract infection without hematuria, site unspecified   I personally performed the services described in this documentation, which was scribed in my presence. The recorded information has been reviewed and is accurate.    New Prescriptions Current Discharge Medication List       Donnetta Hutching, MD 12/30/16 [phone]

## 2016-12-30 NOTE — ED Triage Notes (Signed)
Pt alert to place but does not know what day or time it is.  Pt reports 10/10 mid shoulder pain.  Pt seen in this ED three days ago for same back pain and prescribed flexeril.  EMS report family stopped flexeril dose last night thinking the flexeril was causing AMS.

## 2016-12-30 NOTE — ED Notes (Signed)
Patinent asked for pain medicine at this time. RN aware.

## 2016-12-31 ENCOUNTER — Inpatient Hospital Stay (HOSPITAL_COMMUNITY): Payer: Medicare Other

## 2016-12-31 LAB — CBC
HEMATOCRIT: 34.7 % — AB (ref 36.0–46.0)
Hemoglobin: 11.5 g/dL — ABNORMAL LOW (ref 12.0–15.0)
MCH: 32 pg (ref 26.0–34.0)
MCHC: 33.1 g/dL (ref 30.0–36.0)
MCV: 96.7 fL (ref 78.0–100.0)
Platelets: 175 10*3/uL (ref 150–400)
RBC: 3.59 MIL/uL — ABNORMAL LOW (ref 3.87–5.11)
RDW: 14.7 % (ref 11.5–15.5)
WBC: 11.9 10*3/uL — ABNORMAL HIGH (ref 4.0–10.5)

## 2016-12-31 LAB — BLOOD GAS, ARTERIAL
ACID-BASE EXCESS: 3.8 mmol/L — AB (ref 0.0–2.0)
Acid-Base Excess: 2.1 mmol/L — ABNORMAL HIGH (ref 0.0–2.0)
Bicarbonate: 27.1 mmol/L (ref 20.0–28.0)
Bicarbonate: 25.5 mmol/L (ref 20.0–28.0)
Drawn by: 234301
Drawn by: 234301
FIO2: 21
O2 Content: 3 L/min
O2 SAT: 79.9 %
O2 SAT: 96.8 %
PATIENT TEMPERATURE: 37
PCO2 ART: 49.3 mmHg — AB (ref 32.0–48.0)
PCO2 ART: 51.8 mmHg — AB (ref 32.0–48.0)
PH ART: 7.381 (ref 7.350–7.450)
PO2 ART: 98.9 mmHg (ref 83.0–108.0)
Patient temperature: 37
pH, Arterial: 7.341 — ABNORMAL LOW (ref 7.350–7.450)
pO2, Arterial: 49.7 mmHg — ABNORMAL LOW (ref 83.0–108.0)

## 2016-12-31 LAB — CK: CK TOTAL: 42 U/L (ref 38–234)

## 2016-12-31 LAB — RAPID URINE DRUG SCREEN, HOSP PERFORMED
AMPHETAMINES: NOT DETECTED
BENZODIAZEPINES: NOT DETECTED
Barbiturates: NOT DETECTED
Cocaine: NOT DETECTED
OPIATES: POSITIVE — AB
Tetrahydrocannabinol: NOT DETECTED

## 2016-12-31 LAB — BASIC METABOLIC PANEL
Anion gap: 12 (ref 5–15)
BUN: 14 mg/dL (ref 6–20)
CHLORIDE: 103 mmol/L (ref 101–111)
CO2: 23 mmol/L (ref 22–32)
Calcium: 8.6 mg/dL — ABNORMAL LOW (ref 8.9–10.3)
Creatinine, Ser: 1.08 mg/dL — ABNORMAL HIGH (ref 0.44–1.00)
GFR calc Af Amer: 58 mL/min — ABNORMAL LOW (ref >60)
GFR, EST NON AFRICAN AMERICAN: 50 mL/min — AB (ref >60)
GLUCOSE: 142 mg/dL — AB (ref 65–99)
POTASSIUM: 3.9 mmol/L (ref 3.5–5.1)
SODIUM: 138 mmol/L (ref 135–145)

## 2016-12-31 LAB — AMMONIA: Ammonia: 20 umol/L (ref 9–35)

## 2016-12-31 LAB — GLUCOSE, CAPILLARY: Glucose-Capillary: 176 mg/dL — ABNORMAL HIGH (ref 65–99)

## 2016-12-31 LAB — SEDIMENTATION RATE: SED RATE: 63 mm/h — AB (ref 0–22)

## 2016-12-31 LAB — MRSA PCR SCREENING: MRSA BY PCR: NEGATIVE

## 2016-12-31 MED ORDER — METHYLPREDNISOLONE SODIUM SUCC 1000 MG IJ SOLR
INTRAMUSCULAR | Status: AC
  Filled 2016-12-31: qty 8

## 2016-12-31 MED ORDER — NALOXONE HCL 0.4 MG/ML IJ SOLN
0.4000 mg | INTRAMUSCULAR | Status: DC | PRN
  Administered 2016-12-31: 0.4 mg via INTRAVENOUS
  Filled 2016-12-31: qty 1

## 2016-12-31 MED ORDER — FENTANYL CITRATE (PF) 100 MCG/2ML IJ SOLN
25.0000 ug | INTRAMUSCULAR | Status: DC | PRN
  Administered 2016-12-31: 25 ug via INTRAVENOUS
  Filled 2016-12-31 (×2): qty 2

## 2016-12-31 MED ORDER — SODIUM CHLORIDE 0.9 % IV SOLN
500.0000 mg | Freq: Every day | INTRAVENOUS | Status: DC
  Administered 2017-01-01 – 2017-01-04 (×4): 500 mg via INTRAVENOUS
  Filled 2016-12-31 (×4): qty 4

## 2016-12-31 MED ORDER — DIAZEPAM 5 MG/ML IJ SOLN
2.5000 mg | Freq: Once | INTRAMUSCULAR | Status: AC
  Administered 2016-12-31: 2.5 mg via INTRAVENOUS
  Filled 2016-12-31: qty 2

## 2016-12-31 MED ORDER — HYDROMORPHONE HCL 1 MG/ML IJ SOLN
1.0000 mg | Freq: Once | INTRAMUSCULAR | Status: AC
  Administered 2016-12-31: 1 mg via INTRAVENOUS
  Filled 2016-12-31: qty 1

## 2016-12-31 MED ORDER — MAGNESIUM OXIDE 400 (241.3 MG) MG PO TABS
400.0000 mg | ORAL_TABLET | Freq: Two times a day (BID) | ORAL | Status: DC
  Administered 2016-12-31 (×2): 400 mg via ORAL
  Filled 2016-12-31 (×3): qty 1

## 2016-12-31 NOTE — Consult Note (Signed)
Hulbert A. Merlene Laughter, MD     [email or web address]          Yolanda [name] is an 76 y.o. female.   ASSESSMENT/PLAN: 1. Multifactorial toxic metabolic encephalopathy: Etiologies includes acute urinary tract infection, acute cellulitis and medication effect. I will discontinue all of the patient's psychotropic medications for now. An EEG will be obtained. Given the negative repeat imaging and the nonfocal exam, it is unlikely that major intracranial abnormalities explain her encephalopathy.   2. Asterixis: This is also due to toxic metabolic issues.   3. Unexplained severe diffuse pain: Trial of steroids will be used instead of using opioids and other sedatives.    The patient is 76 year old white female who presents to the hospital with severe back pain associated with cramping and spasms. She was prescribed medications for this including muscle relaxers. Appears that she has been on 3 times a muscle relaxers including baclofen 20 mg. The patient was seen in the emergency room and apparently released but presented afterwards with the confusion and altered mental status. The patient was on the floor and was given a few doses of pain medication she was hollering out for pain. She became unresponsive and also from is sent to the ICU. She has been quite unresponsive but when she wakes up she is crying and hollering for pain. She seemed to be in severe pain involving the joints and muscles. She was last given fentanyl 25 g recently because of the pain. She is not drowsy again. The patient workup has been significant for the UTI and cellulitis. She has been given appropriate antibiotics. Family reports that she has had a history of gout in the past. Review of systems is not obtainable given the encephalopathy.   GENERAL: She goes between late unresponsive to awakening and hollering out.  HEENT: Supple. Atraumatic normocephalic.   ABDOMEN: soft  EXTREMITIES: There is mild  edema of the legs bilaterally. There is marked erythema of the left lower extremity. The patient has severe pain involving all extremities. Just the slightest touch results and her yelling and screaming. No joint swelling is appreciated however.  BACK: Normal.  SKIN: Normal by inspection.    MENTAL STATUS: She lays in bed with eyes closed but does open her eyes to sternal rub. She states her full name. She does follow midline commands consistently. She does not follow appendicular commands consistently but only sporadically and infrequently. No dysarthria is appreciated.   CRANIAL NERVES: Pupils are equal, round and reactive to light; extra ocular movements are full, there is no significant nystagmus; visual fields are full; upper and lower facial muscles are normal in strength and symmetric, there is no flattening of the nasolabial folds; tongue is midline; uvula is midline; shoulder elevation is normal.  MOTOR: She has vigorous strength bilaterally. No focal weakness. Bulk and tone are normal.  COORDINATION: No tremors are dysmetria are appreciated. However, the patient has infrequent flapping/ asterixis involving the upper extremities especially the left upper extremity.   REFLEXES: Deep tendon reflexes are symmetrical and normal. Babinski reflexes are flexor bilaterally.   SENSATION: She responds to painful some light bilaterally.     Blood pressure (!) 108/51, pulse 80, temperature 100.2 F (37.9 C), temperature source Axillary, resp. rate 20, height '5\' 2"'$  (1.575 m), weight 213 lb 3 oz (96.7 kg), SpO2 93 %.  Past Medical History:  Diagnosis Date  . Barrett esophagus   . Bronchitis   . Cellulitis   . COPD (chronic  obstructive pulmonary disease) ([place])   . Diastolic heart failure ([place])   . Essential hypertension   . GERD (gastroesophageal reflux disease)   . Hepatomegaly    ???  . Hypothyroidism   . PAF (paroxysmal atrial fibrillation) ([facility])    Eliquis  . Stage III chronic  kidney disease     Past Surgical History:  Procedure Laterality Date  . BREAST BIOPSY Right Sept, 2012  . CHOLECYSTECTOMY    . COLONOSCOPY    . UPPER GASTROINTESTINAL ENDOSCOPY      Family History  Problem Relation Age of Onset  . Dementia Mother   . Lung cancer Father     smoked  . Hypertension Sister   . Diabetes Brother   . Obesity Sister   . Hypertension Sister   . Restless legs syndrome Sister   . Healthy Sister   . Lung cancer Maternal Uncle     Social History:  reports that she quit smoking about 17 years ago. Her smoking use included Cigarettes. She started smoking about 58 years ago. She has a 15.00 pack-year smoking history. She has never used smokeless tobacco. She reports that she does not drink alcohol or use drugs.  Allergies:  Allergies  Allergen Reactions  . Ciprofloxacin Shortness Of Breath    REACTION: sob,tachycardia  . Doxycycline Nausea Only    Also experienced diarrhea   . Fish Oil     Patient face drew to the side,Bells [place]  . Guaifenesin Shortness Of Breath    REACTION: sob,tachycardia  . Mucinex [Guaifenesin Er] Shortness Of Breath  . Sulfamethoxazole W/Trimethoprim 800-160 [Sulfamethoxazole-Trimethoprim] Nausea Only    Also lack of appetite   . Uloric [Febuxostat] Swelling    No urination  . Adhesive [Tape] Other (See Comments)    Causes blisters on skin  . Allopurinol Other (See Comments)    Couldn't urinate  . Cefuroxime Axetil Swelling    Swelling all over body-per patient she was hospitalized as a result of taking this medication  . Metolazone Nausea Only    Swelling   . Tramadol     insomnia    Medications: Prior to Admission medications   Medication Sig Start Date End Date Taking? Authorizing Provider  acetaminophen (TYLENOL) 500 MG tablet Take 500-1,000 mg by mouth as needed for mild pain.    Yes Historical Provider, MD  albuterol (PROVENTIL HFA;VENTOLIN HFA) 108 (90 Base) MCG/ACT inhaler Inhale 2 puffs into the lungs 4  (four) times daily.    Yes Historical Provider, MD  apixaban (ELIQUIS) 5 MG TABS tablet Take 1 tablet (5 mg total) by mouth 2 (two) times daily. 11/13/14  Yes Sinda Du, MD  baclofen (LIORESAL) 20 MG tablet Take 20 mg by mouth 2 (two) times daily. 12/26/16  Yes Historical Provider, MD  cetirizine (ZYRTEC) 10 MG tablet Take 10 mg by mouth daily.   Yes Historical Provider, MD  cyclobenzaprine (FLEXERIL) 10 MG tablet Take 10 mg by mouth 3 (three) times daily as needed for muscle spasms.   Yes Historical Provider, MD  diltiazem (CARDIZEM CD) 120 MG 24 hr capsule Take 1 capsule (120 mg total) by mouth daily. 01/26/16  Yes Herminio Commons, MD  fluticasone furoate-vilanterol (BREO ELLIPTA) 200-25 MCG/INH AEPB Inhale 1 puff into the lungs daily.   Yes Historical Provider, MD  gabapentin (NEURONTIN) 300 MG capsule Take 600 mg by mouth at bedtime. restless leg syndrome   Yes Historical Provider, MD  HYDROcodone-acetaminophen (NORCO/VICODIN) 5-325 MG tablet Take 1 tablet by mouth  every 4 (four) hours as needed for moderate pain or severe pain. 12/27/16  Yes Forde Dandy, MD  ibuprofen (ADVIL,MOTRIN) 200 MG tablet Take 400 mg by mouth daily as needed for mild pain or moderate pain.   Yes Historical Provider, MD  levothyroxine (SYNTHROID, LEVOTHROID) 137 MCG tablet Take 137 mcg by mouth daily before breakfast.   Yes Historical Provider, MD  Magnesium 250 MG TABS Take 2 tablets by mouth 2 (two) times daily.    Yes Historical Provider, MD  methylPREDNISolone (MEDROL DOSEPAK) 4 MG TBPK tablet Take 1 tablet by mouth daily as needed. For gout 08/28/16  Yes Historical Provider, MD  metoprolol tartrate (LOPRESSOR) 25 MG tablet TAKE 1/2 TABLET BY MOUTH TWICE DAILY 10/23/16  Yes Herminio Commons, MD  omeprazole (PRILOSEC) 20 MG capsule TAKE ONE CAPSULE BY MOUTH DAILY 05/29/16  Yes Rogene Houston, MD  potassium chloride SA (K-DUR,KLOR-CON) 20 MEQ tablet Take 1 tablet (20 mEq total) by mouth daily. 12/10/16  Yes Arnoldo Lenis, MD  torsemide (DEMADEX) 20 MG tablet Take 3 tablets (60 mg total) by mouth 2 (two) times daily. (may take an extra '20mg'$  as needed) 12/13/16  Yes Herminio Commons, MD  triamcinolone (NASACORT ALLERGY 24HR) 55 MCG/ACT AERO nasal inhaler Place 2 sprays into the nose daily as needed (for allergies).    Yes Historical Provider, MD    Scheduled Meds: . apixaban  5 mg Oral BID  . aztreonam  1 g Intravenous Q8H  . baclofen  20 mg Oral BID  . clindamycin (CLEOCIN) IV  600 mg Intravenous Q8H  . diltiazem  120 mg Oral Daily  . docusate sodium  100 mg Oral BID  . fentaNYL (SUBLIMAZE) injection  25 mcg Intravenous Once  . fluticasone furoate-vilanterol  1 puff Inhalation Daily  . gabapentin  300 mg Oral QHS  . levothyroxine  137 mcg Oral QAC breakfast  . loratadine  10 mg Oral Daily  . magnesium oxide  400 mg Oral BID  . metoprolol tartrate  12.5 mg Oral BID  . pantoprazole  40 mg Oral Daily  . potassium chloride SA  20 mEq Oral Daily  . torsemide  60 mg Oral BID   Continuous Infusions: . lactated ringers 100 mL/hr at 12/31/16 1836   PRN Meds:.acetaminophen **OR** acetaminophen, albuterol, fentaNYL (SUBLIMAZE) injection, HYDROcodone-acetaminophen, methocarbamol (ROBAXIN)  IV, naLOXone (NARCAN)  injection, ondansetron **OR** ondansetron (ZOFRAN) IV     Results for orders placed or performed during the hospital encounter of 12/30/16 (from the past 48 hour(s))  Comprehensive metabolic panel     Status: Abnormal   Collection Time: 12/30/16  1:36 PM  Result Value Ref Range   Sodium 145 135 - 145 mmol/L   Potassium 3.5 3.5 - 5.1 mmol/L   Chloride 105 101 - 111 mmol/L   CO2 29 22 - 32 mmol/L   Glucose, Bld 161 (H) 65 - 99 mg/dL   BUN 20 6 - 20 mg/dL   Creatinine, Ser 0.99 0.44 - 1.00 mg/dL   Calcium 9.7 8.9 - 10.3 mg/dL   Total Protein 6.6 6.5 - 8.1 g/dL   Albumin 3.5 3.5 - 5.0 g/dL   AST 34 15 - 41 U/L   ALT 40 14 - 54 U/L   Alkaline Phosphatase 70 38 - 126 U/L   Total  Bilirubin 1.1 0.3 - 1.2 mg/dL   GFR calc non Af Amer 55 (L) >60 mL/min   GFR calc Af Amer >60 >60 mL/min  Comment: (NOTE) The eGFR has been calculated using the CKD EPI equation. This calculation has not been validated in all clinical situations. eGFR's persistently <60 mL/min signify possible Chronic Kidney Disease.    Anion gap 11 5 - 15  CBC     Status: Abnormal   Collection Time: 12/30/16  1:36 PM  Result Value Ref Range   WBC 10.5 4.0 - 10.5 K/uL   RBC 3.84 (L) 3.87 - 5.11 MIL/uL   Hemoglobin 12.3 12.0 - 15.0 g/dL   HCT 36.7 36.0 - 46.0 %   MCV 95.6 78.0 - 100.0 fL   MCH 32.0 26.0 - 34.0 pg   MCHC 33.5 30.0 - 36.0 g/dL   RDW 14.4 11.5 - 15.5 %   Platelets 181 150 - 400 K/uL  Troponin I     Status: None   Collection Time: 12/30/16  1:36 PM  Result Value Ref Range   Troponin I <0.03 <0.03 ng/mL  CBG monitoring, ED     Status: Abnormal   Collection Time: 12/30/16  2:03 PM  Result Value Ref Range   Glucose-Capillary 138 (H) 65 - 99 mg/dL  Urinalysis, Routine w reflex microscopic     Status: Abnormal   Collection Time: 12/30/16  3:37 PM  Result Value Ref Range   Color, Urine YELLOW YELLOW   APPearance CLEAR CLEAR   Specific Gravity, Urine 1.014 1.005 - 1.030   pH 8.0 5.0 - 8.0   Glucose, UA NEGATIVE NEGATIVE mg/dL   Hgb urine dipstick SMALL (A) NEGATIVE   Bilirubin Urine NEGATIVE NEGATIVE   Ketones, ur 20 (A) NEGATIVE mg/dL   Protein, ur NEGATIVE NEGATIVE mg/dL   Nitrite NEGATIVE NEGATIVE   Leukocytes, UA SMALL (A) NEGATIVE   RBC / HPF 0-5 0 - 5 RBC/hpf   WBC, UA TOO NUMEROUS TO COUNT 0 - 5 WBC/hpf   Bacteria, UA RARE (A) NONE SEEN   Squamous Epithelial / LPF 0-5 (A) NONE SEEN   Mucous PRESENT   Culture, blood (Routine X 2) w Reflex to ID Panel     Status: None (Preliminary result)   Collection Time: 12/30/16 10:40 PM  Result Value Ref Range   Specimen Description BLOOD LEFT ARM    Special Requests Blood Culture adequate volume    Culture NO GROWTH < 12 HOURS      Report Status PENDING   TSH     Status: None   Collection Time: 12/30/16 10:40 PM  Result Value Ref Range   TSH 4.192 0.350 - 4.500 uIU/mL    Comment: Performed by a 3rd Generation assay with a functional sensitivity of <=0.01 uIU/mL.  Culture, blood (Routine X 2) w Reflex to ID Panel     Status: None (Preliminary result)   Collection Time: 12/30/16 10:47 PM  Result Value Ref Range   Specimen Description LEFT ANTECUBITAL    Special Requests Blood Culture adequate volume    Culture NO GROWTH < 12 HOURS    Report Status PENDING   Basic metabolic panel     Status: Abnormal   Collection Time: 12/31/16  7:03 AM  Result Value Ref Range   Sodium 138 135 - 145 mmol/L    Comment: DELTA CHECK NOTED   Potassium 3.9 3.5 - 5.1 mmol/L   Chloride 103 101 - 111 mmol/L   CO2 23 22 - 32 mmol/L   Glucose, Bld 142 (H) 65 - 99 mg/dL   BUN 14 6 - 20 mg/dL   Creatinine, Ser 1.08 (H) 0.44 -  1.00 mg/dL   Calcium 8.6 (L) 8.9 - 10.3 mg/dL   GFR calc non Af Amer 50 (L) >60 mL/min   GFR calc Af Amer 58 (L) >60 mL/min    Comment: (NOTE) The eGFR has been calculated using the CKD EPI equation. This calculation has not been validated in all clinical situations. eGFR's persistently <60 mL/min signify possible Chronic Kidney Disease.    Anion gap 12 5 - 15  Blood gas, arterial     Status: Abnormal   Collection Time: 12/31/16  8:05 AM  Result Value Ref Range   FIO2 21.00    Delivery systems ROOM AIR    pH, Arterial 7.381 7.350 - 7.450   pCO2 arterial 49.3 (H) 32.0 - 48.0 mmHg   pO2, Arterial 49.7 (L) 83.0 - 108.0 mmHg   Bicarbonate 27.1 20.0 - 28.0 mmol/L   Acid-Base Excess 3.8 (H) 0.0 - 2.0 mmol/L   O2 Saturation 79.9 %   Patient temperature 37.0    Collection site LEFT RADIAL    Drawn by [id number]    Sample type ARTERIAL DRAW    Allens test (pass/fail) PASS PASS  CBC     Status: Abnormal   Collection Time: 12/31/16  8:08 AM  Result Value Ref Range   WBC 11.9 (H) 4.0 - 10.5 K/uL   RBC 3.59 (L)  3.87 - 5.11 MIL/uL   Hemoglobin 11.5 (L) 12.0 - 15.0 g/dL   HCT 34.7 (L) 36.0 - 46.0 %   MCV 96.7 78.0 - 100.0 fL   MCH 32.0 26.0 - 34.0 pg   MCHC 33.1 30.0 - 36.0 g/dL   RDW 14.7 11.5 - 15.5 %   Platelets 175 150 - 400 K/uL  Glucose, capillary     Status: Abnormal   Collection Time: 12/31/16  1:18 PM  Result Value Ref Range   Glucose-Capillary 176 (H) 65 - 99 mg/dL  Blood gas, arterial     Status: Abnormal   Collection Time: 12/31/16  1:50 PM  Result Value Ref Range   O2 Content 3.0 L/min   Delivery systems NASAL CANNULA    pH, Arterial 7.341 (L) 7.350 - 7.450   pCO2 arterial 51.8 (H) 32.0 - 48.0 mmHg   pO2, Arterial 98.9 83.0 - 108.0 mmHg   Bicarbonate 25.5 20.0 - 28.0 mmol/L   Acid-Base Excess 2.1 (H) 0.0 - 2.0 mmol/L   O2 Saturation 96.8 %   Patient temperature 37.0    Collection site LEFT RADIAL    Drawn by [id number]    Sample type ARTERIAL DRAW    Allens test (pass/fail) PASS PASS  Ammonia     Status: None   Collection Time: 12/31/16  3:04 PM  Result Value Ref Range   Ammonia 20 9 - 35 umol/L  Sedimentation rate     Status: Abnormal   Collection Time: 12/31/16  3:04 PM  Result Value Ref Range   Sed Rate 63 (H) 0 - 22 mm/hr  CK     Status: None   Collection Time: 12/31/16  3:04 PM  Result Value Ref Range   Total CK 42 38 - 234 U/L    Studies/Results:    HEAD CT FINDINGS: Brain: There is no evidence of acute cortical infarct, intracranial hemorrhage, mass, midline shift, or extra-axial fluid collection. The ventricles and sulci are normal for age. Scattered cerebral white matter hypodensities are unchanged and nonspecific but compatible with mild chronic small vessel ischemic disease.  Vascular: Calcified atherosclerosis at the skullbase. No hyperdense vessel.  Skull: No fracture or focal osseous lesion.  Sinuses/Orbits: Paranasal sinuses and mastoid air cells are clear. Mild bilateral exophthalmos.  Other: None.  IMPRESSION: 1. No evidence of  acute intracranial abnormality. 2. Mild chronic small vessel ischemic disease.     HIP AND LSPINE FILM FINDINGS: There is no evidence of hip fracture or dislocation. There is no evidence of advanced arthropathy or other focal bone abnormality. 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 Mild vascular calcifications.  IMPRESSION: Negative.  FINDINGS: Single AP view of the lumbar spine. The vertebral bodies appear intact on this frontal radiograph. Severe lower lumbar facet arthropathy. No destructive bony lesions. Sacroiliac joints symmetric. No destructive bony lesions. Surgical clips in the included right abdomen compatible with cholecystectomy.  IMPRESSION: Limited AP view without fracture deformity though lateral radiograph would be more sensitive.        Jemell Town A. Merlene Laughter, M.D.  Diplomate, Tax adviser of Psychiatry and Neurology ( Neurology). 12/31/2016, 6:48 PM

## 2016-12-31 NOTE — Progress Notes (Signed)
Patient has c/o back spasms throughout the night. Robaxin  given IV, Neurontin  given PO, Baclofen  given PO, and Norco 5-325mg  given Po. Patient still has not had relief of back spams. MD paged and 1x order given for Valium 2.5mg  IV.

## 2016-12-31 NOTE — Progress Notes (Signed)
Subjective: She was admitted with confusion presumably from urinary tract infection. She's been having a lot of trouble with back spasms. She was given the large and early this morning and she is poorly responsive now. She is snoring. She will arouse and says she knows where she is but then she goes right back to sleep.  Objective: Vital signs in last 24 hours: Temp:  [97.6 F (36.4 C)-98.4 F (36.9 C)] 97.6 F (36.4 C) (04/09 0615) Pulse Rate:  [80-115] 80 (04/09 0615) Resp:  [19-32] 20 (04/09 0615) BP: (100-172)/(51-88) 108/51 (04/09 0615) SpO2:  [93 %-97 %] 93 % (04/09 0615) Weight:  [95.3 kg (210 lb)-96.1 kg (211 lb 14.4 oz)] 96.1 kg (211 lb 14.4 oz) (04/08 1850) Weight change:     Intake/Output from previous day: 04/08 0701 - 04/09 0700 In: 2315 [I.V.:1115; IV Piggyback:1200] Out: 200 [Urine:200]  PHYSICAL EXAM General appearance: Sleepy, snoring, poorly responsive, arousable but then goes right back to sleep. Resp: rhonchi bilaterally Cardio: regular rate and rhythm, S1, S2 normal, no murmur, click, rub or gallop GI: soft, non-tender; bowel sounds normal; no masses,  no organomegaly Extremities: She has erythema of her left leg and has had multiple bouts of cellulitis Skin otherwise warm and dry. Mucous membranes are dry  Lab Results:  Results for orders placed or performed during the hospital encounter of 12/30/16 (from the past 48 hour(s))  Comprehensive metabolic panel     Status: Abnormal   Collection Time: 12/30/16  1:36 PM  Result Value Ref Range   Sodium 145 135 - 145 mmol/L   Potassium 3.5 3.5 - 5.1 mmol/L   Chloride 105 101 - 111 mmol/L   CO2 29 22 - 32 mmol/L   Glucose, Bld 161 (H) 65 - 99 mg/dL   BUN 20 6 - 20 mg/dL   Creatinine, Ser 0.99 0.44 - 1.00 mg/dL   Calcium 9.7 8.9 - 10.3 mg/dL   Total Protein 6.6 6.5 - 8.1 g/dL   Albumin 3.5 3.5 - 5.0 g/dL   AST 34 15 - 41 U/L   ALT 40 14 - 54 U/L   Alkaline Phosphatase 70 38 - 126 U/L   Total Bilirubin 1.1  0.3 - 1.2 mg/dL   GFR calc non Af Amer 55 (L) >60 mL/min   GFR calc Af Amer >60 >60 mL/min    Comment: (NOTE) The eGFR has been calculated using the CKD EPI equation. This calculation has not been validated in all clinical situations. eGFR's persistently <60 mL/min signify possible Chronic Kidney Disease.    Anion gap 11 5 - 15  CBC     Status: Abnormal   Collection Time: 12/30/16  1:36 PM  Result Value Ref Range   WBC 10.5 4.0 - 10.5 K/uL   RBC 3.84 (L) 3.87 - 5.11 MIL/uL   Hemoglobin 12.3 12.0 - 15.0 g/dL   HCT 36.7 36.0 - 46.0 %   MCV 95.6 78.0 - 100.0 fL   MCH 32.0 26.0 - 34.0 pg   MCHC 33.5 30.0 - 36.0 g/dL   RDW 14.4 11.5 - 15.5 %   Platelets 181 150 - 400 K/uL  Troponin I     Status: None   Collection Time: 12/30/16  1:36 PM  Result Value Ref Range   Troponin I <0.03 <0.03 ng/mL  CBG monitoring, ED     Status: Abnormal   Collection Time: 12/30/16  2:03 PM  Result Value Ref Range   Glucose-Capillary 138 (H) 65 - 99 mg/dL  Urinalysis, Routine w reflex microscopic     Status: Abnormal   Collection Time: 12/30/16  3:37 PM  Result Value Ref Range   Color, Urine YELLOW YELLOW   APPearance CLEAR CLEAR   Specific Gravity, Urine 1.014 1.005 - 1.030   pH 8.0 5.0 - 8.0   Glucose, UA NEGATIVE NEGATIVE mg/dL   Hgb urine dipstick SMALL (A) NEGATIVE   Bilirubin Urine NEGATIVE NEGATIVE   Ketones, ur 20 (A) NEGATIVE mg/dL   Protein, ur NEGATIVE NEGATIVE mg/dL   Nitrite NEGATIVE NEGATIVE   Leukocytes, UA SMALL (A) NEGATIVE   RBC / HPF 0-5 0 - 5 RBC/hpf   WBC, UA TOO NUMEROUS TO COUNT 0 - 5 WBC/hpf   Bacteria, UA RARE (A) NONE SEEN   Squamous Epithelial / LPF 0-5 (A) NONE SEEN   Mucous PRESENT   Culture, blood (Routine X 2) w Reflex to ID Panel     Status: None (Preliminary result)   Collection Time: 12/30/16 10:40 PM  Result Value Ref Range   Specimen Description BLOOD LEFT ARM    Special Requests Blood Culture adequate volume    Culture PENDING    Report Status PENDING    TSH     Status: None   Collection Time: 12/30/16 10:40 PM  Result Value Ref Range   TSH 4.192 0.350 - 4.500 uIU/mL    Comment: Performed by a 3rd Generation assay with a functional sensitivity of <=0.01 uIU/mL.  Culture, blood (Routine X 2) w Reflex to ID Panel     Status: None (Preliminary result)   Collection Time: 12/30/16 10:47 PM  Result Value Ref Range   Specimen Description LEFT ANTECUBITAL    Special Requests Blood Culture adequate volume    Culture PENDING    Report Status PENDING   Basic metabolic panel     Status: Abnormal   Collection Time: 12/31/16  7:03 AM  Result Value Ref Range   Sodium 138 135 - 145 mmol/L    Comment: DELTA CHECK NOTED   Potassium 3.9 3.5 - 5.1 mmol/L   Chloride 103 101 - 111 mmol/L   CO2 23 22 - 32 mmol/L   Glucose, Bld 142 (H) 65 - 99 mg/dL   BUN 14 6 - 20 mg/dL   Creatinine, Ser 1.08 (H) 0.44 - 1.00 mg/dL   Calcium 8.6 (L) 8.9 - 10.3 mg/dL   GFR calc non Af Amer 50 (L) >60 mL/min   GFR calc Af Amer 58 (L) >60 mL/min    Comment: (NOTE) The eGFR has been calculated using the CKD EPI equation. This calculation has not been validated in all clinical situations. eGFR's persistently <60 mL/min signify possible Chronic Kidney Disease.    Anion gap 12 5 - 15  Blood gas, arterial     Status: Abnormal   Collection Time: 12/31/16  8:05 AM  Result Value Ref Range   FIO2 21.00    Delivery systems ROOM AIR    pH, Arterial 7.381 7.350 - 7.450   pCO2 arterial 49.3 (H) 32.0 - 48.0 mmHg   pO2, Arterial 49.7 (L) 83.0 - 108.0 mmHg   Bicarbonate 27.1 20.0 - 28.0 mmol/L   Acid-Base Excess 3.8 (H) 0.0 - 2.0 mmol/L   O2 Saturation 79.9 %   Patient temperature 37.0    Collection site LEFT RADIAL    Drawn by [id number]    Sample type ARTERIAL DRAW    Allens test (pass/fail) PASS PASS    ABGS  Recent Labs  12/31/16 0805  PHART 7.381  PO2ART 49.7*  HCO3 27.1   CULTURES Recent Results (from the past 240 hour(s))  Culture, blood (Routine X 2) w  Reflex to ID Panel     Status: None (Preliminary result)   Collection Time: 12/30/16 10:40 PM  Result Value Ref Range Status   Specimen Description BLOOD LEFT ARM  Final   Special Requests Blood Culture adequate volume  Final   Culture PENDING  Incomplete   Report Status PENDING  Incomplete  Culture, blood (Routine X 2) w Reflex to ID Panel     Status: None (Preliminary result)   Collection Time: 12/30/16 10:47 PM  Result Value Ref Range Status   Specimen Description LEFT ANTECUBITAL  Final   Special Requests Blood Culture adequate volume  Final   Culture PENDING  Incomplete   Report Status PENDING  Incomplete   Studies/Results: Dg Chest 2 View  Result Date: 12/30/2016 CLINICAL DATA:  Weakness, disoriented EXAM: CHEST  2 VIEW COMPARISON:  09/02/2016 FINDINGS: Stable cardiomegaly without edema or CHF. No focal pneumonia, collapse or consolidation. No significant effusion or pneumothorax. Trachea is midline. Atherosclerosis noted of the aorta. Monitor leads overlie the chest. Bones are osteopenic. Degenerative changes noted spine. Obese body habitus evident. IMPRESSION: Stable chest exam. No superimposed acute process or interval change. Cardiomegaly without CHF Thoracic aortic atherosclerosis Electronically Signed   By: Jerilynn Mages.  Shick M.D.   On: 12/30/2016 14:45   Ct Head Wo Contrast  Result Date: 12/30/2016 CLINICAL DATA:  Altered mental status, on Eliquis EXAM: CT HEAD WITHOUT CONTRAST TECHNIQUE: Contiguous axial images were obtained from the base of the skull through the vertex without intravenous contrast. COMPARISON:  None. FINDINGS: Brain: No evidence of acute infarction, hemorrhage, hydrocephalus, extra-axial collection or mass lesion/mass effect. Mild age related atrophy. Mild subcortical white matter and periventricular small vessel ischemic changes. Vascular: Intracranial atherosclerosis. Skull: Normal. Negative for fracture or focal lesion. Sinuses/Orbits: The visualized paranasal sinuses  are essentially clear. The mastoid air cells are unopacified. Other: None. IMPRESSION: No evidence of acute intracranial abnormality. Mild atrophy with small vessel ischemic changes. Electronically Signed   By: Julian Hy M.D.   On: 12/30/2016 17:08    Medications:  Prior to Admission:  Prescriptions Prior to Admission  Medication Sig Dispense Refill Last Dose  . acetaminophen (TYLENOL) 500 MG tablet Take 500-1,000 mg by mouth as needed for mild pain.    12/26/2016 at Unknown time  . albuterol (PROVENTIL HFA;VENTOLIN HFA) 108 (90 Base) MCG/ACT inhaler Inhale 2 puffs into the lungs 4 (four) times daily.    Past Week at Unknown time  . apixaban (ELIQUIS) 5 MG TABS tablet Take 1 tablet (5 mg total) by mouth 2 (two) times daily. 60 tablet 5 12/27/2016 at 1100a  . baclofen (LIORESAL) 20 MG tablet Take 20 mg by mouth 2 (two) times daily.  1 12/27/2016 at Unknown time  . cetirizine (ZYRTEC) 10 MG tablet Take 10 mg by mouth daily.   12/27/2016 at Unknown time  . cyclobenzaprine (FLEXERIL) 10 MG tablet Take 10 mg by mouth 3 (three) times daily as needed for muscle spasms.   12/27/2016 at Unknown time  . diltiazem (CARDIZEM CD) 120 MG 24 hr capsule Take 1 capsule (120 mg total) by mouth daily. 90 capsule 3 12/27/2016 at Unknown time  . fluticasone furoate-vilanterol (BREO ELLIPTA) 200-25 MCG/INH AEPB Inhale 1 puff into the lungs daily.   12/27/2016 at Unknown time  . gabapentin (NEURONTIN) 300 MG capsule Take 300-900 mg by mouth at  bedtime. restless leg syndrome   12/26/2016 at Unknown time  . HYDROcodone-acetaminophen (NORCO/VICODIN) 5-325 MG tablet Take 1 tablet by mouth every 4 (four) hours as needed for moderate pain or severe pain. 10 tablet 0   . ibuprofen (ADVIL,MOTRIN) 200 MG tablet Take 400 mg by mouth daily as needed for mild pain or moderate pain.   Past Week at Unknown time  . levalbuterol (XOPENEX) 0.63 MG/3ML nebulizer solution Take 3 mLs (0.63 mg total) by nebulization every 6 (six) hours as needed for  wheezing or shortness of breath. 90 mL 12 unknown  . levothyroxine (SYNTHROID, LEVOTHROID) 137 MCG tablet Take 137 mcg by mouth daily before breakfast.   12/27/2016 at 1100a  . Magnesium 250 MG TABS Take 2 tablets by mouth 2 (two) times daily.    12/27/2016 at Unknown time  . methylPREDNISolone (MEDROL DOSEPAK) 4 MG TBPK tablet Take 1 tablet by mouth daily as needed. For gout  2 12/26/2016 at Unknown time  . metoprolol tartrate (LOPRESSOR) 25 MG tablet TAKE 1/2 TABLET BY MOUTH TWICE DAILY 90 tablet 1 12/27/2016 at 1100a  . omeprazole (PRILOSEC) 20 MG capsule TAKE ONE CAPSULE BY MOUTH DAILY 90 capsule 3 12/27/2016 at Unknown time  . potassium chloride SA (K-DUR,KLOR-CON) 20 MEQ tablet Take 1 tablet (20 mEq total) by mouth daily.   12/27/2016 at Unknown time  . torsemide (DEMADEX) 20 MG tablet Take 3 tablets (60 mg total) by mouth 2 (two) times daily. (may take an extra '20mg'$  as needed)   12/27/2016 at Unknown time  . triamcinolone (NASACORT ALLERGY 24HR) 55 MCG/ACT AERO nasal inhaler Place 2 sprays into the nose daily as needed (for allergies).    unkown  . VOLTAREN 1 % GEL Apply 1 application topically daily as needed (for pain).   0 unknown   Scheduled: . apixaban  5 mg Oral BID  . aztreonam  1 g Intravenous Q8H  . baclofen  20 mg Oral BID  . clindamycin (CLEOCIN) IV  600 mg Intravenous Q8H  . diltiazem  120 mg Oral Daily  . docusate sodium  100 mg Oral BID  . fentaNYL (SUBLIMAZE) injection  25 mcg Intravenous Once  . fluticasone furoate-vilanterol  1 puff Inhalation Daily  . gabapentin  300 mg Oral QHS  . levothyroxine  137 mcg Oral QAC breakfast  . loratadine  10 mg Oral Daily  . magnesium oxide  400 mg Oral BID  . metoprolol tartrate  12.5 mg Oral BID  . pantoprazole  40 mg Oral Daily  . potassium chloride SA  20 mEq Oral Daily  . torsemide  60 mg Oral BID   Continuous: . lactated ringers 100 mL/hr at 12/31/16 0604   DVV:[id number] **OR** acetaminophen, albuterol, HYDROcodone-acetaminophen,  methocarbamol (ROBAXIN)  IV, ondansetron **OR** ondansetron (ZOFRAN) IV  Assesment: She is admitted with acute encephalopathy I think from urinary tract infection. She's having a lot of dental pain. At baseline she has heart failure COPD recurrent cellulitis paroxysmal atrial fibrillation. She is very somnolent. This may be from medications but may be from her COPD and I'm concerned about her PCO2 being up. My plan therefore is to go ahead and transfer to stepdown. Check a blood gas. She may need BiPAP. Principal Problem:   Acute encephalopathy Active Problems:   Hypothyroidism   PAF (paroxysmal atrial fibrillation) (HCC)   Cellulitis of left lower extremity   Chronic anticoagulation   Acute lower UTI   Pain, dental   Spasm of back muscles   Hyperglycemia  Plan: As above    LOS: 1 day   Alverna Fawley L 12/31/2016, 8:25 AM

## 2016-12-31 NOTE — Progress Notes (Signed)
Patient very drowsy and painful. Unable to answer questions appropriately other than name and place. MD notified that patient was very painful, and LOC was still not at baseline. Concern regarding possible injuries from previous falls and also patient takes Eliquis for afib. MD ordered CT of head and xray of pelvis/back. Family updated and at bedside.

## 2016-12-31 NOTE — Progress Notes (Signed)
Dr Juanetta Gosling paged with update on patient. LOC unchanged, patient still very painful and agitated. Dr Juanetta Gosling advised that he wanted a NEURO consult with Dr Gerilyn Pilgrim and to call his office to advise him that he had a consult in ICU. Dr Gerilyn Pilgrim was contacted and given information regarding patient condition. He advised he would be by [facility] to see patient. Dr Juanetta Gosling paged again and advised that Dr Gerilyn Pilgrim was contacted and family was updated.

## 2016-12-31 NOTE — Progress Notes (Signed)
Patient continues to c/o back spasms without relief. MD paged and orders given for Dilaudid  IV. After Dilaudid given patient resting comfortably.

## 2016-12-31 NOTE — Progress Notes (Signed)
Patient transported with RN to CT and xray. Patient very painful during transport and transfer for scans. PT stable with no events. Patient transported back to ICU via bed. Paged MD with scan and xray results (Neg), MD ordered Sed Rate, CK, and ammonia levels. Also advised MD of patient pain level and discussed giving PRN dose of Fentanyl. MD ordered Fentanyl Prn Q2 hrs for pain. One dose of Fentanyl 25 mcg given at 1526 for pain. Patient vital signs stable. Patient taking small sips of water and answering questions. Oriented to person, place.

## 2016-12-31 NOTE — Progress Notes (Signed)
Unable to give patient 1800 PO medications, due to level LOC.

## 2017-01-01 ENCOUNTER — Inpatient Hospital Stay (HOSPITAL_COMMUNITY)
Admit: 2017-01-01 | Discharge: 2017-01-01 | Disposition: A | Discharge status: Home / Self Care | Payer: Medicare Other | Attending: Neurology | Admitting: Neurology

## 2017-01-01 DIAGNOSIS — G9341 Metabolic encephalopathy: Secondary | ICD-10-CM | POA: Diagnosis present

## 2017-01-01 LAB — CBC WITH DIFFERENTIAL/PLATELET
Basophils Absolute: 0 10*3/uL (ref 0.0–0.1)
Basophils Relative: 0 %
EOS ABS: 0 10*3/uL (ref 0.0–0.7)
EOS PCT: 0 %
HCT: 32.3 % — ABNORMAL LOW (ref 36.0–46.0)
HEMOGLOBIN: 10.7 g/dL — AB (ref 12.0–15.0)
LYMPHS ABS: 0.6 10*3/uL — AB (ref 0.7–4.0)
Lymphocytes Relative: 3 %
MCH: 31.8 pg (ref 26.0–34.0)
MCHC: 33.1 g/dL (ref 30.0–36.0)
MCV: 96.1 fL (ref 78.0–100.0)
MONOS PCT: 6 %
Monocytes Absolute: 1 10*3/uL (ref 0.1–1.0)
Neutro Abs: 17.3 10*3/uL — ABNORMAL HIGH (ref 1.7–7.7)
Neutrophils Relative %: 91 %
PLATELETS: 139 10*3/uL — AB (ref 150–400)
RBC: 3.36 MIL/uL — ABNORMAL LOW (ref 3.87–5.11)
RDW: 14.8 % (ref 11.5–15.5)
WBC: 18.9 10*3/uL — ABNORMAL HIGH (ref 4.0–10.5)

## 2017-01-01 LAB — BASIC METABOLIC PANEL
Anion gap: 9 (ref 5–15)
BUN: 19 mg/dL (ref 6–20)
CHLORIDE: 101 mmol/L (ref 101–111)
CO2: 28 mmol/L (ref 22–32)
CREATININE: 1.41 mg/dL — AB (ref 0.44–1.00)
Calcium: 8.4 mg/dL — ABNORMAL LOW (ref 8.9–10.3)
GFR calc Af Amer: 42 mL/min — ABNORMAL LOW (ref >60)
GFR calc non Af Amer: 36 mL/min — ABNORMAL LOW (ref >60)
Glucose, Bld: 191 mg/dL — ABNORMAL HIGH (ref 65–99)
Potassium: 4.3 mmol/L (ref 3.5–5.1)
Sodium: 138 mmol/L (ref 135–145)

## 2017-01-01 LAB — URINE CULTURE

## 2017-01-01 LAB — LACTIC ACID, PLASMA
LACTIC ACID, VENOUS: 0.9 mmol/L (ref 0.5–1.9)
Lactic Acid, Venous: 1 mmol/L (ref 0.5–1.9)

## 2017-01-01 LAB — INFLUENZA PANEL BY PCR (TYPE A & B)
INFLAPCR: NEGATIVE
INFLBPCR: NEGATIVE

## 2017-01-01 LAB — URIC ACID: URIC ACID, SERUM: 11.8 mg/dL — AB (ref 2.3–6.6)

## 2017-01-01 MED ORDER — PANTOPRAZOLE SODIUM 40 MG IV SOLR
40.0000 mg | INTRAVENOUS | Status: DC
  Administered 2017-01-01 – 2017-01-04 (×4): 40 mg via INTRAVENOUS
  Filled 2017-01-01 (×4): qty 40

## 2017-01-01 MED ORDER — LEVOTHYROXINE SODIUM 100 MCG IV SOLR
75.0000 ug | Freq: Every day | INTRAVENOUS | Status: DC
Start: 2017-01-01 — End: 2017-01-03
  Administered 2017-01-01 – 2017-01-02 (×2): 75 ug via INTRAVENOUS
  Filled 2017-01-01 (×4): qty 5

## 2017-01-01 MED ORDER — ORAL CARE MOUTH RINSE
15.0000 mL | Freq: Two times a day (BID) | OROMUCOSAL | Status: DC
  Administered 2017-01-02 – 2017-01-06 (×9): 15 mL via OROMUCOSAL

## 2017-01-01 MED ORDER — CHLORHEXIDINE GLUCONATE 0.12 % MT SOLN
15.0000 mL | Freq: Two times a day (BID) | OROMUCOSAL | Status: DC
  Administered 2017-01-01 – 2017-01-07 (×12): 15 mL via OROMUCOSAL
  Filled 2017-01-01 (×12): qty 15

## 2017-01-01 MED ORDER — METOPROLOL TARTRATE 5 MG/5ML IV SOLN
2.5000 mg | Freq: Four times a day (QID) | INTRAVENOUS | Status: DC
  Administered 2017-01-01 – 2017-01-03 (×8): 2.5 mg via INTRAVENOUS
  Filled 2017-01-01 (×8): qty 5

## 2017-01-01 MED ORDER — GLUCERNA SHAKE PO LIQD
237.0000 mL | Freq: Three times a day (TID) | ORAL | Status: DC
  Administered 2017-01-01 – 2017-01-06 (×13): 237 mL via ORAL

## 2017-01-01 NOTE — Consult Note (Signed)
Yolanda Vista A. Merlene Laughter, MD     [email or web address]          Yolanda [name] is an 76 y.o. female.   ASSESSMENT/PLAN: 1. Multifactorial toxic metabolic encephalopathy: Etiologies includes acute urinary tract infection, acute cellulitis and medication effect. I will discontinue all of the patient's psychotropic medications for now.  Given the negative repeat imaging and the nonfocal exam, it is unlikely that major intracranial abnormalities explain her encephalopathy.   2. Asterixis: This is also due to toxic metabolic issues.   3. Unexplained severe diffuse pain: Trial of steroids will be used instead of using opioids and other sedatives.    It appears that she is more responsive today. She continues to have significant pain when manipulated or touched.   GENERAL: She is resting more cognitively in bed.  HEENT: Supple. Atraumatic normocephalic.   ABDOMEN: soft  EXTREMITIES: There is mild edema of the legs bilaterally. There is marked erythema of the left lower extremity. The patient has severe pain involving all extremities. Just the slightest touch results and her yelling and screaming. No joint swelling is appreciated however.  BACK: Normal.  SKIN: Normal by inspection.    MENTAL STATUS: She owns her eyes to light tactile stimulation. She follows commands more briskly today although only midline commands. She does state that she did okay and states a few 2 word sentences.  No dysarthria is appreciated.   CRANIAL NERVES: Pupils are equal, round and reactive to light; extra ocular movements are full, there is no significant nystagmus; visual fields are full; upper and lower facial muscles are normal in strength and symmetric, there is no flattening of the nasolabial folds; tongue is midline; uvula is midline; shoulder elevation is normal.  MOTOR: She has vigorous strength bilaterally. No focal weakness. Bulk and tone are normal.  COORDINATION: No tremors are  dysmetria are appreciated.    SENSATION: She responds to painful some light bilaterally.    EEG This recording shows frontal intermittent rhythmic delta activity and also triphasic waves. Both are associated with toxic metabolic processes.   Blood pressure 111/63, pulse 75, temperature 99.8 F (37.7 C), temperature source Axillary, resp. rate 12, height '5\' 2"'$  (1.575 m), weight 215 lb 6.2 oz (97.7 kg), SpO2 98 %.  Past Medical History:  Diagnosis Date  . Barrett esophagus   . Bronchitis   . Cellulitis   . COPD (chronic obstructive pulmonary disease) ([place])   . Diastolic heart failure ([place])   . Essential hypertension   . GERD (gastroesophageal reflux disease)   . Hepatomegaly    ???  . Hypothyroidism   . PAF (paroxysmal atrial fibrillation) ([facility])    Eliquis  . Stage III chronic kidney disease     Past Surgical History:  Procedure Laterality Date  . BREAST BIOPSY Right Sept, 2012  . CHOLECYSTECTOMY    . COLONOSCOPY    . UPPER GASTROINTESTINAL ENDOSCOPY      Family History  Problem Relation Age of Onset  . Dementia Mother   . Lung cancer Father     smoked  . Hypertension Sister   . Diabetes Brother   . Obesity Sister   . Hypertension Sister   . Restless legs syndrome Sister   . Healthy Sister   . Lung cancer Maternal Uncle     Social History:  reports that she quit smoking about 17 years ago. Her smoking use included Cigarettes. She started smoking about 58 years ago. She has a 15.00 pack-year smoking  history. She has never used smokeless tobacco. She reports that she does not drink alcohol or use drugs.  Allergies:  Allergies  Allergen Reactions  . Ciprofloxacin Shortness Of Breath    REACTION: sob,tachycardia  . Doxycycline Nausea Only    Also experienced diarrhea   . Fish Oil     Patient face drew to the side,Bells [place]  . Guaifenesin Shortness Of Breath    REACTION: sob,tachycardia  . Mucinex [Guaifenesin Er] Shortness Of Breath  . Sulfamethoxazole  W/Trimethoprim 800-160 [Sulfamethoxazole-Trimethoprim] Nausea Only    Also lack of appetite   . Uloric [Febuxostat] Swelling    No urination  . Adhesive [Tape] Other (See Comments)    Causes blisters on skin  . Allopurinol Other (See Comments)    Couldn't urinate  . Cefuroxime Axetil Swelling    Swelling all over body-per patient she was hospitalized as a result of taking this medication  . Metolazone Nausea Only    Swelling   . Tramadol     insomnia    Medications: Prior to Admission medications   Medication Sig Start Date End Date Taking? Authorizing Provider  acetaminophen (TYLENOL) 500 MG tablet Take 500-1,000 mg by mouth as needed for mild pain.    Yes Historical Provider, MD  albuterol (PROVENTIL HFA;VENTOLIN HFA) 108 (90 Base) MCG/ACT inhaler Inhale 2 puffs into the lungs 4 (four) times daily.    Yes Historical Provider, MD  apixaban (ELIQUIS) 5 MG TABS tablet Take 1 tablet (5 mg total) by mouth 2 (two) times daily. 11/13/14  Yes Sinda Du, MD  baclofen (LIORESAL) 20 MG tablet Take 20 mg by mouth 2 (two) times daily. 12/26/16  Yes Historical Provider, MD  cetirizine (ZYRTEC) 10 MG tablet Take 10 mg by mouth daily.   Yes Historical Provider, MD  cyclobenzaprine (FLEXERIL) 10 MG tablet Take 10 mg by mouth 3 (three) times daily as needed for muscle spasms.   Yes Historical Provider, MD  diltiazem (CARDIZEM CD) 120 MG 24 hr capsule Take 1 capsule (120 mg total) by mouth daily. 01/26/16  Yes Herminio Commons, MD  fluticasone furoate-vilanterol (BREO ELLIPTA) 200-25 MCG/INH AEPB Inhale 1 puff into the lungs daily.   Yes Historical Provider, MD  gabapentin (NEURONTIN) 300 MG capsule Take 600 mg by mouth at bedtime. restless leg syndrome   Yes Historical Provider, MD  HYDROcodone-acetaminophen (NORCO/VICODIN) 5-325 MG tablet Take 1 tablet by mouth every 4 (four) hours as needed for moderate pain or severe pain. 12/27/16  Yes Forde Dandy, MD  ibuprofen (ADVIL,MOTRIN) 200 MG tablet Take  400 mg by mouth daily as needed for mild pain or moderate pain.   Yes Historical Provider, MD  levothyroxine (SYNTHROID, LEVOTHROID) 137 MCG tablet Take 137 mcg by mouth daily before breakfast.   Yes Historical Provider, MD  Magnesium 250 MG TABS Take 2 tablets by mouth 2 (two) times daily.    Yes Historical Provider, MD  methylPREDNISolone (MEDROL DOSEPAK) 4 MG TBPK tablet Take 1 tablet by mouth daily as needed. For gout 08/28/16  Yes Historical Provider, MD  metoprolol tartrate (LOPRESSOR) 25 MG tablet TAKE 1/2 TABLET BY MOUTH TWICE DAILY 10/23/16  Yes Herminio Commons, MD  omeprazole (PRILOSEC) 20 MG capsule TAKE ONE CAPSULE BY MOUTH DAILY 05/29/16  Yes Rogene Houston, MD  potassium chloride SA (K-DUR,KLOR-CON) 20 MEQ tablet Take 1 tablet (20 mEq total) by mouth daily. 12/10/16  Yes Arnoldo Lenis, MD  torsemide (DEMADEX) 20 MG tablet Take 3 tablets (60 mg  total) by mouth 2 (two) times daily. (may take an extra '20mg'$  as needed) 12/13/16  Yes Herminio Commons, MD  triamcinolone (NASACORT ALLERGY 24HR) 55 MCG/ACT AERO nasal inhaler Place 2 sprays into the nose daily as needed (for allergies).    Yes Historical Provider, MD    Scheduled Meds: . apixaban  5 mg Oral BID  . aztreonam  1 g Intravenous Q8H  . chlorhexidine  15 mL Mouth Rinse BID  . clindamycin (CLEOCIN) IV  600 mg Intravenous Q8H  . feeding supplement (GLUCERNA SHAKE)  237 mL Oral TID BM  . fentaNYL (SUBLIMAZE) injection  25 mcg Intravenous Once  . fluticasone furoate-vilanterol  1 puff Inhalation Daily  . levothyroxine  75 mcg Intravenous Daily  . [START ON 01/02/2017] mouth rinse  15 mL Mouth Rinse q12n4p  . methylPREDNISolone (SOLU-MEDROL) injection  500 mg Intravenous Daily  . metoprolol  2.5 mg Intravenous Q6H  . pantoprazole (PROTONIX) IV  40 mg Intravenous Q24H   Continuous Infusions: . lactated ringers 100 mL/hr at 01/01/17 1754   PRN Meds:.acetaminophen **OR** acetaminophen, albuterol, naLOXone (NARCAN)  injection,  ondansetron **OR** ondansetron (ZOFRAN) IV     Results for orders placed or performed during the hospital encounter of 12/30/16 (from the past 48 hour(s))  Culture, blood (Routine X 2) w Reflex to ID Panel     Status: None (Preliminary result)   Collection Time: 12/30/16 10:40 PM  Result Value Ref Range   Specimen Description BLOOD LEFT ARM    Special Requests Blood Culture adequate volume    Culture NO GROWTH 2 DAYS    Report Status PENDING   TSH     Status: None   Collection Time: 12/30/16 10:40 PM  Result Value Ref Range   TSH 4.192 0.350 - 4.500 uIU/mL    Comment: Performed by a 3rd Generation assay with a functional sensitivity of <=0.01 uIU/mL.  Culture, blood (Routine X 2) w Reflex to ID Panel     Status: None (Preliminary result)   Collection Time: 12/30/16 10:47 PM  Result Value Ref Range   Specimen Description LEFT ANTECUBITAL    Special Requests Blood Culture adequate volume    Culture NO GROWTH 2 DAYS    Report Status PENDING   Basic metabolic panel     Status: Abnormal   Collection Time: 12/31/16  7:03 AM  Result Value Ref Range   Sodium 138 135 - 145 mmol/L    Comment: DELTA CHECK NOTED   Potassium 3.9 3.5 - 5.1 mmol/L   Chloride 103 101 - 111 mmol/L   CO2 23 22 - 32 mmol/L   Glucose, Bld 142 (H) 65 - 99 mg/dL   BUN 14 6 - 20 mg/dL   Creatinine, Ser 1.08 (H) 0.44 - 1.00 mg/dL   Calcium 8.6 (L) 8.9 - 10.3 mg/dL   GFR calc non Af Amer 50 (L) >60 mL/min   GFR calc Af Amer 58 (L) >60 mL/min    Comment: (NOTE) The eGFR has been calculated using the CKD EPI equation. This calculation has not been validated in all clinical situations. eGFR's persistently <60 mL/min signify possible Chronic Kidney Disease.    Anion gap 12 5 - 15  Blood gas, arterial     Status: Abnormal   Collection Time: 12/31/16  8:05 AM  Result Value Ref Range   FIO2 21.00    Delivery systems ROOM AIR    pH, Arterial 7.381 7.350 - 7.450   pCO2 arterial 49.3 (H) 32.0 -  48.0 mmHg   pO2,  Arterial 49.7 (L) 83.0 - 108.0 mmHg   Bicarbonate 27.1 20.0 - 28.0 mmol/L   Acid-Base Excess 3.8 (H) 0.0 - 2.0 mmol/L   O2 Saturation 79.9 %   Patient temperature 37.0    Collection site LEFT RADIAL    Drawn by [id number]    Sample type ARTERIAL DRAW    Allens test (pass/fail) PASS PASS  CBC     Status: Abnormal   Collection Time: 12/31/16  8:08 AM  Result Value Ref Range   WBC 11.9 (H) 4.0 - 10.5 K/uL   RBC 3.59 (L) 3.87 - 5.11 MIL/uL   Hemoglobin 11.5 (L) 12.0 - 15.0 g/dL   HCT 34.7 (L) 36.0 - 46.0 %   MCV 96.7 78.0 - 100.0 fL   MCH 32.0 26.0 - 34.0 pg   MCHC 33.1 30.0 - 36.0 g/dL   RDW 14.7 11.5 - 15.5 %   Platelets 175 150 - 400 K/uL  MRSA PCR Screening     Status: None   Collection Time: 12/31/16 10:08 AM  Result Value Ref Range   MRSA by PCR NEGATIVE NEGATIVE    Comment:        The GeneXpert MRSA Assay (FDA approved for NASAL specimens only), is one component of a comprehensive MRSA colonization surveillance program. It is not intended to diagnose MRSA infection nor to guide or monitor treatment for MRSA infections.   Glucose, capillary     Status: Abnormal   Collection Time: 12/31/16  1:18 PM  Result Value Ref Range   Glucose-Capillary 176 (H) 65 - 99 mg/dL  Blood gas, arterial     Status: Abnormal   Collection Time: 12/31/16  1:50 PM  Result Value Ref Range   O2 Content 3.0 L/min   Delivery systems NASAL CANNULA    pH, Arterial 7.341 (L) 7.350 - 7.450   pCO2 arterial 51.8 (H) 32.0 - 48.0 mmHg   pO2, Arterial 98.9 83.0 - 108.0 mmHg   Bicarbonate 25.5 20.0 - 28.0 mmol/L   Acid-Base Excess 2.1 (H) 0.0 - 2.0 mmol/L   O2 Saturation 96.8 %   Patient temperature 37.0    Collection site LEFT RADIAL    Drawn by [id number]    Sample type ARTERIAL DRAW    Allens test (pass/fail) PASS PASS  Ammonia     Status: None   Collection Time: 12/31/16  3:04 PM  Result Value Ref Range   Ammonia 20 9 - 35 umol/L  Sedimentation rate     Status: Abnormal   Collection Time:  12/31/16  3:04 PM  Result Value Ref Range   Sed Rate 63 (H) 0 - 22 mm/hr  CK     Status: None   Collection Time: 12/31/16  3:04 PM  Result Value Ref Range   Total CK 42 38 - 234 U/L  Urine rapid drug screen (hosp performed)     Status: Abnormal   Collection Time: 12/31/16  5:40 PM  Result Value Ref Range   Opiates POSITIVE (A) NONE DETECTED   Cocaine NONE DETECTED NONE DETECTED   Benzodiazepines NONE DETECTED NONE DETECTED   Amphetamines NONE DETECTED NONE DETECTED   Tetrahydrocannabinol NONE DETECTED NONE DETECTED   Barbiturates NONE DETECTED NONE DETECTED    Comment:        DRUG SCREEN FOR MEDICAL PURPOSES ONLY.  IF CONFIRMATION IS NEEDED FOR ANY PURPOSE, NOTIFY LAB WITHIN 5 DAYS.        LOWEST DETECTABLE LIMITS FOR URINE DRUG  SCREEN Drug Class       Cutoff (ng/mL) Amphetamine      1000 Barbiturate      200 Benzodiazepine   200 Tricyclics       300 Opiates          300 Cocaine          300 THC              50   CBC with Differential/Platelet     Status: Abnormal   Collection Time: 01/01/17  9:04 AM  Result Value Ref Range   WBC 18.9 (H) 4.0 - 10.5 K/uL   RBC 3.36 (L) 3.87 - 5.11 MIL/uL   Hemoglobin 10.7 (L) 12.0 - 15.0 g/dL   HCT 00.9 (L) 23.3 - 00.7 %   MCV 96.1 78.0 - 100.0 fL   MCH 31.8 26.0 - 34.0 pg   MCHC 33.1 30.0 - 36.0 g/dL   RDW 62.2 63.3 - 35.4 %   Platelets 139 (L) 150 - 400 K/uL   Neutrophils Relative % 91 %   Neutro Abs 17.3 (H) 1.7 - 7.7 K/uL   Lymphocytes Relative 3 %   Lymphs Abs 0.6 (L) 0.7 - 4.0 K/uL   Monocytes Relative 6 %   Monocytes Absolute 1.0 0.1 - 1.0 K/uL   Eosinophils Relative 0 %   Eosinophils Absolute 0.0 0.0 - 0.7 K/uL   Basophils Relative 0 %   Basophils Absolute 0.0 0.0 - 0.1 K/uL  Basic metabolic panel     Status: Abnormal   Collection Time: 01/01/17  9:04 AM  Result Value Ref Range   Sodium 138 135 - 145 mmol/L   Potassium 4.3 3.5 - 5.1 mmol/L   Chloride 101 101 - 111 mmol/L   CO2 28 22 - 32 mmol/L   Glucose, Bld 191  (H) 65 - 99 mg/dL   BUN 19 6 - 20 mg/dL   Creatinine, Ser 5.62 (H) 0.44 - 1.00 mg/dL   Calcium 8.4 (L) 8.9 - 10.3 mg/dL   GFR calc non Af Amer 36 (L) >60 mL/min   GFR calc Af Amer 42 (L) >60 mL/min    Comment: (NOTE) The eGFR has been calculated using the CKD EPI equation. This calculation has not been validated in all clinical situations. eGFR's persistently <60 mL/min signify possible Chronic Kidney Disease.    Anion gap 9 5 - 15  Influenza panel by PCR (type A & B)     Status: None   Collection Time: 01/01/17 11:23 AM  Result Value Ref Range   Influenza A By PCR NEGATIVE NEGATIVE   Influenza B By PCR NEGATIVE NEGATIVE    Comment: (NOTE) The Xpert Xpress Flu assay is intended as an aid in the diagnosis of  influenza and should not be used as a sole basis for treatment.  This  assay is FDA approved for nasopharyngeal swab specimens only. Nasal  washings and aspirates are unacceptable for Xpert Xpress Flu testing.   Uric acid     Status: Abnormal   Collection Time: 01/01/17 12:03 PM  Result Value Ref Range   Uric Acid, Serum 11.8 (H) 2.3 - 6.6 mg/dL  Lactic acid, plasma     Status: None   Collection Time: 01/01/17 12:03 PM  Result Value Ref Range   Lactic Acid, Venous 0.9 0.5 - 1.9 mmol/L  Lactic acid, plasma     Status: None   Collection Time: 01/01/17  2:25 PM  Result Value Ref Range   Lactic  Acid, Venous 1.0 0.5 - 1.9 mmol/L    Studies/Results:    HEAD CT FINDINGS: Brain: There is no evidence of acute cortical infarct, intracranial hemorrhage, mass, midline shift, or extra-axial fluid collection. The ventricles and sulci are normal for age. Scattered cerebral white matter hypodensities are unchanged and nonspecific but compatible with mild chronic small vessel ischemic disease.  Vascular: Calcified atherosclerosis at the skullbase. No hyperdense vessel.  Skull: No fracture or focal osseous lesion.  Sinuses/Orbits: Paranasal sinuses and mastoid air cells are  clear. Mild bilateral exophthalmos.  Other: None.  IMPRESSION: 1. No evidence of acute intracranial abnormality. 2. Mild chronic small vessel ischemic disease.     HIP AND LSPINE FILM FINDINGS: There is no evidence of hip fracture or dislocation. There is no evidence of advanced arthropathy or other focal bone abnormality. 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 Mild vascular calcifications.  IMPRESSION: Negative.  FINDINGS: Single AP view of the lumbar spine. The vertebral bodies appear intact on this frontal radiograph. Severe lower lumbar facet arthropathy. No destructive bony lesions. Sacroiliac joints symmetric. No destructive bony lesions. Surgical clips in the included right abdomen compatible with cholecystectomy.  IMPRESSION: Limited AP view without fracture deformity though lateral radiograph would be more sensitive.        Lessie Funderburke A. Merlene [name], M.D.  Diplomate, Tax adviser of Psychiatry and Neurology ( Neurology). 01/01/2017, 6:17 PM

## 2017-01-01 NOTE — Progress Notes (Signed)
Subjective: She does respond and talk to me but then goes back to sleep. She still has marked encephalopathy and has markedly exaggerated pain response to touch. Dr. Merlene Laughter as seen her and his help is appreciated. If she has toxic effect of medications I would expect that these would be coming out of her system 7. She did receive a new medication from the emergency department after she fell. Thus far blood cultures are negative and workup negative. Urine culture is pending. She does have cellulitis which looks better. She is on broad-spectrum antibiotics.  Objective: Vital signs in last 24 hours: Temp:  [98 F (36.7 C)-100.2 F (37.9 C)] 99.6 F (37.6 C) (04/10 0720) Pulse Rate:  [87-117] 99 (04/10 0720) Resp:  [14-29] 20 (04/10 0720) BP: (130-160)/(50-69) 160/63 (04/09 2228) SpO2:  [95 %-99 %] 95 % (04/10 0720) Weight:  [96.7 kg (213 lb 3 oz)-97.7 kg (215 lb 6.2 oz)] 97.7 kg (215 lb 6.2 oz) (04/10 0500) Weight change: 1.445 kg (3 lb 3 oz)    Intake/Output from previous day: 04/09 0701 - 04/10 0700 In: 1433.3 [P.O.:150; I.V.:1283.3] Out: 100 [Urine:100]  PHYSICAL EXAM General appearance: Arousable but sleepy Resp: clear to auscultation bilaterally Cardio: regular rate and rhythm, S1, S2 normal, no murmur, click, rub or gallop GI: soft, non-tender; bowel sounds normal; no masses,  no organomegaly Extremities: Her cellulitis looks better Skin: She still responds to light touch of her skin by crying out in pain. Mucous membranes are moist.  Lab Results:  Results for orders placed or performed during the hospital encounter of 12/30/16 (from the past 48 hour(s))  Comprehensive metabolic panel     Status: Abnormal   Collection Time: 12/30/16  1:36 PM  Result Value Ref Range   Sodium 145 135 - 145 mmol/L   Potassium 3.5 3.5 - 5.1 mmol/L   Chloride 105 101 - 111 mmol/L   CO2 29 22 - 32 mmol/L   Glucose, Bld 161 (H) 65 - 99 mg/dL   BUN 20 6 - 20 mg/dL   Creatinine, Ser 0.99 0.44 -  1.00 mg/dL   Calcium 9.7 8.9 - 10.3 mg/dL   Total Protein 6.6 6.5 - 8.1 g/dL   Albumin 3.5 3.5 - 5.0 g/dL   AST 34 15 - 41 U/L   ALT 40 14 - 54 U/L   Alkaline Phosphatase 70 38 - 126 U/L   Total Bilirubin 1.1 0.3 - 1.2 mg/dL   GFR calc non Af Amer 55 (L) >60 mL/min   GFR calc Af Amer >60 >60 mL/min    Comment: (NOTE) The eGFR has been calculated using the CKD EPI equation. This calculation has not been validated in all clinical situations. eGFR's persistently <60 mL/min signify possible Chronic Kidney Disease.    Anion gap 11 5 - 15  CBC     Status: Abnormal   Collection Time: 12/30/16  1:36 PM  Result Value Ref Range   WBC 10.5 4.0 - 10.5 K/uL   RBC 3.84 (L) 3.87 - 5.11 MIL/uL   Hemoglobin 12.3 12.0 - 15.0 g/dL   HCT 36.7 36.0 - 46.0 %   MCV 95.6 78.0 - 100.0 fL   MCH 32.0 26.0 - 34.0 pg   MCHC 33.5 30.0 - 36.0 g/dL   RDW 14.4 11.5 - 15.5 %   Platelets 181 150 - 400 K/uL  Troponin I     Status: None   Collection Time: 12/30/16  1:36 PM  Result Value Ref Range   Troponin  I <0.03 <0.03 ng/mL  CBG monitoring, ED     Status: Abnormal   Collection Time: 12/30/16  2:03 PM  Result Value Ref Range   Glucose-Capillary 138 (H) 65 - 99 mg/dL  Urinalysis, Routine w reflex microscopic     Status: Abnormal   Collection Time: 12/30/16  3:37 PM  Result Value Ref Range   Color, Urine YELLOW YELLOW   APPearance CLEAR CLEAR   Specific Gravity, Urine 1.014 1.005 - 1.030   pH 8.0 5.0 - 8.0   Glucose, UA NEGATIVE NEGATIVE mg/dL   Hgb urine dipstick SMALL (A) NEGATIVE   Bilirubin Urine NEGATIVE NEGATIVE   Ketones, ur 20 (A) NEGATIVE mg/dL   Protein, ur NEGATIVE NEGATIVE mg/dL   Nitrite NEGATIVE NEGATIVE   Leukocytes, UA SMALL (A) NEGATIVE   RBC / HPF 0-5 0 - 5 RBC/hpf   WBC, UA TOO NUMEROUS TO COUNT 0 - 5 WBC/hpf   Bacteria, UA RARE (A) NONE SEEN   Squamous Epithelial / LPF 0-5 (A) NONE SEEN   Mucous PRESENT   Culture, blood (Routine X 2) w Reflex to ID Panel     Status: None  (Preliminary result)   Collection Time: 12/30/16 10:40 PM  Result Value Ref Range   Specimen Description BLOOD LEFT ARM    Special Requests Blood Culture adequate volume    Culture NO GROWTH < 12 HOURS    Report Status PENDING   TSH     Status: None   Collection Time: 12/30/16 10:40 PM  Result Value Ref Range   TSH 4.192 0.350 - 4.500 uIU/mL    Comment: Performed by a 3rd Generation assay with a functional sensitivity of <=0.01 uIU/mL.  Culture, blood (Routine X 2) w Reflex to ID Panel     Status: None (Preliminary result)   Collection Time: 12/30/16 10:47 PM  Result Value Ref Range   Specimen Description LEFT ANTECUBITAL    Special Requests Blood Culture adequate volume    Culture NO GROWTH < 12 HOURS    Report Status PENDING   Basic metabolic panel     Status: Abnormal   Collection Time: 12/31/16  7:03 AM  Result Value Ref Range   Sodium 138 135 - 145 mmol/L    Comment: DELTA CHECK NOTED   Potassium 3.9 3.5 - 5.1 mmol/L   Chloride 103 101 - 111 mmol/L   CO2 23 22 - 32 mmol/L   Glucose, Bld 142 (H) 65 - 99 mg/dL   BUN 14 6 - 20 mg/dL   Creatinine, Ser 1.08 (H) 0.44 - 1.00 mg/dL   Calcium 8.6 (L) 8.9 - 10.3 mg/dL   GFR calc non Af Amer 50 (L) >60 mL/min   GFR calc Af Amer 58 (L) >60 mL/min    Comment: (NOTE) The eGFR has been calculated using the CKD EPI equation. This calculation has not been validated in all clinical situations. eGFR's persistently <60 mL/min signify possible Chronic Kidney Disease.    Anion gap 12 5 - 15  Blood gas, arterial     Status: Abnormal   Collection Time: 12/31/16  8:05 AM  Result Value Ref Range   FIO2 21.00    Delivery systems ROOM AIR    pH, Arterial 7.381 7.350 - 7.450   pCO2 arterial 49.3 (H) 32.0 - 48.0 mmHg   pO2, Arterial 49.7 (L) 83.0 - 108.0 mmHg   Bicarbonate 27.1 20.0 - 28.0 mmol/L   Acid-Base Excess 3.8 (H) 0.0 - 2.0 mmol/L   O2 Saturation  79.9 %   Patient temperature 37.0    Collection site LEFT RADIAL    Drawn by [phone]     Sample type ARTERIAL DRAW    Allens test (pass/fail) PASS PASS  CBC     Status: Abnormal   Collection Time: 12/31/16  8:08 AM  Result Value Ref Range   WBC 11.9 (H) 4.0 - 10.5 K/uL   RBC 3.59 (L) 3.87 - 5.11 MIL/uL   Hemoglobin 11.5 (L) 12.0 - 15.0 g/dL   HCT 34.7 (L) 36.0 - 46.0 %   MCV 96.7 78.0 - 100.0 fL   MCH 32.0 26.0 - 34.0 pg   MCHC 33.1 30.0 - 36.0 g/dL   RDW 14.7 11.5 - 15.5 %   Platelets 175 150 - 400 K/uL  MRSA PCR Screening     Status: None   Collection Time: 12/31/16 10:08 AM  Result Value Ref Range   MRSA by PCR NEGATIVE NEGATIVE    Comment:        The GeneXpert MRSA Assay (FDA approved for NASAL specimens only), is one component of a comprehensive MRSA colonization surveillance program. It is not intended to diagnose MRSA infection nor to guide or monitor treatment for MRSA infections.   Glucose, capillary     Status: Abnormal   Collection Time: 12/31/16  1:18 PM  Result Value Ref Range   Glucose-Capillary 176 (H) 65 - 99 mg/dL  Blood gas, arterial     Status: Abnormal   Collection Time: 12/31/16  1:50 PM  Result Value Ref Range   O2 Content 3.0 L/min   Delivery systems NASAL CANNULA    pH, Arterial 7.341 (L) 7.350 - 7.450   pCO2 arterial 51.8 (H) 32.0 - 48.0 mmHg   pO2, Arterial 98.9 83.0 - 108.0 mmHg   Bicarbonate 25.5 20.0 - 28.0 mmol/L   Acid-Base Excess 2.1 (H) 0.0 - 2.0 mmol/L   O2 Saturation 96.8 %   Patient temperature 37.0    Collection site LEFT RADIAL    Drawn by [id number]    Sample type ARTERIAL DRAW    Allens test (pass/fail) PASS PASS  Ammonia     Status: None   Collection Time: 12/31/16  3:04 PM  Result Value Ref Range   Ammonia 20 9 - 35 umol/L  Sedimentation rate     Status: Abnormal   Collection Time: 12/31/16  3:04 PM  Result Value Ref Range   Sed Rate 63 (H) 0 - 22 mm/hr  CK     Status: None   Collection Time: 12/31/16  3:04 PM  Result Value Ref Range   Total CK 42 38 - 234 U/L  Urine rapid drug screen (hosp performed)      Status: Abnormal   Collection Time: 12/31/16  5:40 PM  Result Value Ref Range   Opiates POSITIVE (A) NONE DETECTED   Cocaine NONE DETECTED NONE DETECTED   Benzodiazepines NONE DETECTED NONE DETECTED   Amphetamines NONE DETECTED NONE DETECTED   Tetrahydrocannabinol NONE DETECTED NONE DETECTED   Barbiturates NONE DETECTED NONE DETECTED    Comment:        DRUG SCREEN FOR MEDICAL PURPOSES ONLY.  IF CONFIRMATION IS NEEDED FOR ANY PURPOSE, NOTIFY LAB WITHIN 5 DAYS.        LOWEST DETECTABLE LIMITS FOR URINE DRUG SCREEN Drug Class       Cutoff (ng/mL) Amphetamine      1000 Barbiturate      200 Benzodiazepine   951 Tricyclics  300 Opiates          300 Cocaine          300 THC              50     ABGS  Recent Labs  12/31/16 1350  PHART 7.341*  PO2ART 98.9  HCO3 25.5   CULTURES Recent Results (from the past 240 hour(s))  Culture, blood (Routine X 2) w Reflex to ID Panel     Status: None (Preliminary result)   Collection Time: 12/30/16 10:40 PM  Result Value Ref Range Status   Specimen Description BLOOD LEFT ARM  Final   Special Requests Blood Culture adequate volume  Final   Culture NO GROWTH < 12 HOURS  Final   Report Status PENDING  Incomplete  Culture, blood (Routine X 2) w Reflex to ID Panel     Status: None (Preliminary result)   Collection Time: 12/30/16 10:47 PM  Result Value Ref Range Status   Specimen Description LEFT ANTECUBITAL  Final   Special Requests Blood Culture adequate volume  Final   Culture NO GROWTH < 12 HOURS  Final   Report Status PENDING  Incomplete  MRSA PCR Screening     Status: None   Collection Time: 12/31/16 10:08 AM  Result Value Ref Range Status   MRSA by PCR NEGATIVE NEGATIVE Final    Comment:        The GeneXpert MRSA Assay (FDA approved for NASAL specimens only), is one component of a comprehensive MRSA colonization surveillance program. It is not intended to diagnose MRSA infection nor to guide or monitor treatment  for MRSA infections.    Studies/Results: Dg Chest 2 View  Result Date: 12/30/2016 CLINICAL DATA:  Weakness, disoriented EXAM: CHEST  2 VIEW COMPARISON:  09/02/2016 FINDINGS: Stable cardiomegaly without edema or CHF. No focal pneumonia, collapse or consolidation. No significant effusion or pneumothorax. Trachea is midline. Atherosclerosis noted of the aorta. Monitor leads overlie the chest. Bones are osteopenic. Degenerative changes noted spine. Obese body habitus evident. IMPRESSION: Stable chest exam. No superimposed acute process or interval change. Cardiomegaly without CHF Thoracic aortic atherosclerosis Electronically Signed   By: Jerilynn Mages.  Shick M.D.   On: 12/30/2016 14:45   Ct Head Wo Contrast  Result Date: 12/31/2016 CLINICAL DATA:  Altered mental status.  Recent falls. EXAM: CT HEAD WITHOUT CONTRAST TECHNIQUE: Contiguous axial images were obtained from the base of the skull through the vertex without intravenous contrast. COMPARISON:  12/30/2016 FINDINGS: Brain: There is no evidence of acute cortical infarct, intracranial hemorrhage, mass, midline shift, or extra-axial fluid collection. The ventricles and sulci are normal for age. Scattered cerebral white matter hypodensities are unchanged and nonspecific but compatible with mild chronic small vessel ischemic disease. Vascular: Calcified atherosclerosis at the skullbase. No hyperdense vessel. Skull: No fracture or focal osseous lesion. Sinuses/Orbits: Paranasal sinuses and mastoid air cells are clear. Mild bilateral exophthalmos. Other: None. IMPRESSION: 1. No evidence of acute intracranial abnormality. 2. Mild chronic small vessel ischemic disease. Electronically Signed   By: Logan Bores M.D.   On: 12/31/2016 14:17   Ct Head Wo Contrast  Result Date: 12/30/2016 CLINICAL DATA:  Altered mental status, on Eliquis EXAM: CT HEAD WITHOUT CONTRAST TECHNIQUE: Contiguous axial images were obtained from the base of the skull through the vertex without  intravenous contrast. COMPARISON:  None. FINDINGS: Brain: No evidence of acute infarction, hemorrhage, hydrocephalus, extra-axial collection or mass lesion/mass effect. Mild age related atrophy. Mild subcortical white matter and periventricular  small vessel ischemic changes. Vascular: Intracranial atherosclerosis. Skull: Normal. Negative for fracture or focal lesion. Sinuses/Orbits: The visualized paranasal sinuses are essentially clear. The mastoid air cells are unopacified. Other: None. IMPRESSION: No evidence of acute intracranial abnormality. Mild atrophy with small vessel ischemic changes. Electronically Signed   By: Julian Hy M.D.   On: 12/30/2016 17:08   Dg Lumbar Spine 1 View  Result Date: 12/31/2016 CLINICAL DATA:  Multiple falls, severe pain. EXAM: LUMBAR SPINE - 1 VIEW COMPARISON:  MRI of lumbar spine April 25, 2015 FINDINGS: Single AP view of the lumbar spine. The vertebral bodies appear intact on this frontal radiograph. Severe lower lumbar facet arthropathy. No destructive bony lesions. Sacroiliac joints symmetric. No destructive bony lesions. Surgical clips in the included right abdomen compatible with cholecystectomy. IMPRESSION: Limited AP view without fracture deformity though lateral radiograph would be more sensitive. Electronically Signed   By: Elon Alas M.D.   On: 12/31/2016 14:21   US Renal  Result Date: 12/31/2016 CLINICAL DATA:  Dysuria, UTI.  Chronic kidney disease. EXAM: RENAL / URINARY TRACT ULTRASOUND COMPLETE COMPARISON:  03/19/2016 FINDINGS: Right Kidney: Length: 10.5 cm. Cortical thinning. Normal echotexture. No hydronephrosis or mass. Left Kidney: Length: 10.0 cm. Cortical thinning. Normal echotexture. No hydronephrosis or mass. Bladder: Appears normal for degree of bladder distention. IMPRESSION: No acute findings.  Mild cortical thinning.  No hydronephrosis. Electronically Signed   By: Rolm Baptise M.D.   On: 12/31/2016 12:24   Dg Hips Bilat With Pelvis Min  5 Views  Result Date: 12/31/2016 CLINICAL DATA:  Recent falls, severe pain. EXAM: DG HIP (WITH OR WITHOUT PELVIS) 5+V BILAT COMPARISON:  None. FINDINGS: There is no evidence of hip fracture or dislocation. There is no evidence of advanced arthropathy or other focal bone abnormality. 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 Mild vascular calcifications. IMPRESSION: Negative. Electronically Signed   By: Elon Alas M.D.   On: 12/31/2016 14:22    Medications:  Prior to Admission:  Prescriptions Prior to Admission  Medication Sig Dispense Refill Last Dose  . acetaminophen (TYLENOL) 500 MG tablet Take 500-1,000 mg by mouth as needed for mild pain.    unknown  . albuterol (PROVENTIL HFA;VENTOLIN HFA) 108 (90 Base) MCG/ACT inhaler Inhale 2 puffs into the lungs 4 (four) times daily.    unknown  . apixaban (ELIQUIS) 5 MG TABS tablet Take 1 tablet (5 mg total) by mouth 2 (two) times daily. 60 tablet 5 12/30/2016 at unknown  . baclofen (LIORESAL) 20 MG tablet Take 20 mg by mouth 2 (two) times daily.  1 12/30/2016 at Unknown time  . cetirizine (ZYRTEC) 10 MG tablet Take 10 mg by mouth daily.   unknown  . cyclobenzaprine (FLEXERIL) 10 MG tablet Take 10 mg by mouth 3 (three) times daily as needed for muscle spasms.   12/30/2016 at Unknown time  . diltiazem (CARDIZEM CD) 120 MG 24 hr capsule Take 1 capsule (120 mg total) by mouth daily. 90 capsule 3 12/30/2016 at Unknown time  . fluticasone furoate-vilanterol (BREO ELLIPTA) 200-25 MCG/INH AEPB Inhale 1 puff into the lungs daily.   unknown  . gabapentin (NEURONTIN) 300 MG capsule Take 600 mg by mouth at bedtime. restless leg syndrome   12/30/2016 at Unknown time  . HYDROcodone-acetaminophen (NORCO/VICODIN) 5-325 MG tablet Take 1 tablet by mouth every 4 (four) hours as needed for moderate pain or severe pain. 10 tablet 0 12/30/2016 at Unknown time  . ibuprofen (ADVIL,MOTRIN) 200 MG tablet Take 400 mg  by mouth daily as needed for mild pain or moderate  pain.   unknown  . levothyroxine (SYNTHROID, LEVOTHROID) 137 MCG tablet Take 137 mcg by mouth daily before breakfast.   12/30/2016 at Unknown time  . Magnesium 250 MG TABS Take 2 tablets by mouth 2 (two) times daily.    unknown  . methylPREDNISolone (MEDROL DOSEPAK) 4 MG TBPK tablet Take 1 tablet by mouth daily as needed. For gout  2 Past Week at Unknown time  . metoprolol tartrate (LOPRESSOR) 25 MG tablet TAKE 1/2 TABLET BY MOUTH TWICE DAILY 90 tablet 1 12/30/2016 at Unknown time  . omeprazole (PRILOSEC) 20 MG capsule TAKE ONE CAPSULE BY MOUTH DAILY 90 capsule 3 12/30/2016 at Unknown time  . potassium chloride SA (K-DUR,KLOR-CON) 20 MEQ tablet Take 1 tablet (20 mEq total) by mouth daily.   12/30/2016 at Unknown time  . torsemide (DEMADEX) 20 MG tablet Take 3 tablets (60 mg total) by mouth 2 (two) times daily. (may take an extra 73m as needed)   12/30/2016 at Unknown time  . triamcinolone (NASACORT ALLERGY 24HR) 55 MCG/ACT AERO nasal inhaler Place 2 sprays into the nose daily as needed (for allergies).    unknown   Scheduled: . apixaban  5 mg Oral BID  . aztreonam  1 g Intravenous Q8H  . clindamycin (CLEOCIN) IV  600 mg Intravenous Q8H  . diltiazem  120 mg Oral Daily  . docusate sodium  100 mg Oral BID  . fentaNYL (SUBLIMAZE) injection  25 mcg Intravenous Once  . fluticasone furoate-vilanterol  1 puff Inhalation Daily  . levothyroxine  137 mcg Oral QAC breakfast  . loratadine  10 mg Oral Daily  . magnesium oxide  400 mg Oral BID  . methylPREDNISolone (SOLU-MEDROL) injection  500 mg Intravenous Daily  . metoprolol tartrate  12.5 mg Oral BID  . pantoprazole  40 mg Oral Daily  . potassium chloride SA  20 mEq Oral Daily  . torsemide  60 mg Oral BID   Continuous: . lactated ringers 100 mL/hr at 01/01/17 0533   PAOZ:HYQMVHQIONGEX**OR** acetaminophen, albuterol, naLOXone (NARCAN)  injection, ondansetron **OR** ondansetron (ZOFRAN) IV  Assesment: She was admitted with metabolic encephalopathy  presumably from urinary tract infection. She is still encephalopathic. It's not totally clear what this is from. She is off medications now. She still has increased pain response even to light touch. She is on broad-spectrum antibiotics. She has left lower leg cellulitis and that's better. She has a history of atrial fib but she is in sinus rhythm now. She has COPD but her breathing seems okay. Blood gas showed that she was hypoxic and has mildly elevated PCO2 but she is stable. She has heart failure but doesn't show any evidence of any acute heart failure now. Principal Problem:   Acute encephalopathy Active Problems:   Hypothyroidism   PAF (paroxysmal atrial fibrillation) (HCC)   Cellulitis of left lower extremity   Chronic anticoagulation   Acute lower UTI   Pain, dental   Spasm of back muscles   Hyperglycemia    Plan: Continue treatments. I don't think there is anything to add at this time were going to simply have to wait for her to clear either because she is getting better from her infection or because the toxic medications have been metabolized.    LOS: 2 days   Chundra Sauerwein L 01/01/2017, 8:29 AM

## 2017-01-01 NOTE — Procedures (Signed)
[facility] Delano Scardino A. Gerilyn Pilgrim, MD     [email or web address]           HISTORY: Patient is a 76 year old who presents with confusion and altered mental status. This is done to evaluate for seizures as the etiology.  MEDICATIONS: Scheduled Meds: . apixaban  5 mg Oral BID  . aztreonam  1 g Intravenous Q8H  . chlorhexidine  15 mL Mouth Rinse BID  . clindamycin (CLEOCIN) IV  600 mg Intravenous Q8H  . feeding supplement (GLUCERNA SHAKE)  237 mL Oral TID BM  . fentaNYL (SUBLIMAZE) injection  25 mcg Intravenous Once  . fluticasone furoate-vilanterol  1 puff Inhalation Daily  . levothyroxine  75 mcg Intravenous Daily  . [START ON 01/02/2017] mouth rinse  15 mL Mouth Rinse q12n4p  . methylPREDNISolone (SOLU-MEDROL) injection  500 mg Intravenous Daily  . metoprolol  2.5 mg Intravenous Q6H  . pantoprazole (PROTONIX) IV  40 mg Intravenous Q24H   Continuous Infusions: . lactated ringers 100 mL/hr at 01/01/17 1754   PRN Meds:.acetaminophen **OR** acetaminophen, albuterol, naLOXone (NARCAN)  injection, ondansetron **OR** ondansetron (ZOFRAN) IV  Prior to Admission medications   Medication Sig Start Date End Date Taking? Authorizing Provider  acetaminophen (TYLENOL) 500 MG tablet Take 500-1,000 mg by mouth as needed for mild pain.    Yes Historical Provider, MD  albuterol (PROVENTIL HFA;VENTOLIN HFA) 108 (90 Base) MCG/ACT inhaler Inhale 2 puffs into the lungs 4 (four) times daily.    Yes Historical Provider, MD  apixaban (ELIQUIS) 5 MG TABS tablet Take 1 tablet (5 mg total) by mouth 2 (two) times daily. 11/13/14  Yes Kari Baars, MD  baclofen (LIORESAL) 20 MG tablet Take 20 mg by mouth 2 (two) times daily. 12/26/16  Yes Historical Provider, MD  cetirizine (ZYRTEC) 10 MG tablet Take 10 mg by mouth daily.   Yes Historical Provider, MD  cyclobenzaprine (FLEXERIL) 10 MG tablet Take 10 mg by mouth 3 (three) times daily as needed for muscle spasms.   Yes Historical Provider, MD  diltiazem  (CARDIZEM CD) 120 MG 24 hr capsule Take 1 capsule (120 mg total) by mouth daily. 01/26/16  Yes Laqueta Linden, MD  fluticasone furoate-vilanterol (BREO ELLIPTA) 200-25 MCG/INH AEPB Inhale 1 puff into the lungs daily.   Yes Historical Provider, MD  gabapentin (NEURONTIN) 300 MG capsule Take 600 mg by mouth at bedtime. restless leg syndrome   Yes Historical Provider, MD  HYDROcodone-acetaminophen (NORCO/VICODIN) 5-325 MG tablet Take 1 tablet by mouth every 4 (four) hours as needed for moderate pain or severe pain. 12/27/16  Yes Lavera Guise, MD  ibuprofen (ADVIL,MOTRIN) 200 MG tablet Take 400 mg by mouth daily as needed for mild pain or moderate pain.   Yes Historical Provider, MD  levothyroxine (SYNTHROID, LEVOTHROID) 137 MCG tablet Take 137 mcg by mouth daily before breakfast.   Yes Historical Provider, MD  Magnesium 250 MG TABS Take 2 tablets by mouth 2 (two) times daily.    Yes Historical Provider, MD  methylPREDNISolone (MEDROL DOSEPAK) 4 MG TBPK tablet Take 1 tablet by mouth daily as needed. For gout 08/28/16  Yes Historical Provider, MD  metoprolol tartrate (LOPRESSOR) 25 MG tablet TAKE 1/2 TABLET BY MOUTH TWICE DAILY 10/23/16  Yes Laqueta Linden, MD  omeprazole (PRILOSEC) 20 MG capsule TAKE ONE CAPSULE BY MOUTH DAILY 05/29/16  Yes Malissa Hippo, MD  potassium chloride SA (K-DUR,KLOR-CON) 20 MEQ tablet Take 1 tablet (20 mEq total) by mouth daily. 12/10/16  Yes Antoine Poche,  MD  torsemide (DEMADEX) 20 MG tablet Take 3 tablets (60 mg total) by mouth 2 (two) times daily. (may take an extra  as needed) 12/13/16  Yes Laqueta Linden, MD  triamcinolone (NASACORT ALLERGY 24HR) 55 MCG/ACT AERO nasal inhaler Place 2 sprays into the nose daily as needed (for allergies).    Yes Historical Provider, MD      ANALYSIS: A 16 channel recording using standard 10 20 measurements is conducted for 22 minutes. The background activity is as high as 8 Hz. Beta activity is observed in the frontal  areas. Awake and sleep activities are observed. Spindles, K complexes and vertex sharp waves are identified. Photic stimulation and hyperventilation are not conducted. Episodes of frontal intermittent rhythmic delta activities are observed. Occasional triphasic waves are also observed. There is no focal or lateralized slowing. There is no epileptiform activity is observed.   IMPRESSION: This recording shows frontal intermittent rhythmic delta activity and also triphasic waves. Both are associated with toxic metabolic processes.      Gurshan Settlemire A. Gerilyn Pilgrim, M.D.  Diplomate, Biomedical engineer of Psychiatry and Neurology ( Neurology).

## 2017-01-01 NOTE — Progress Notes (Signed)
EEG Completed; Results Pending  

## 2017-01-01 NOTE — Progress Notes (Signed)
Initial Nutrition Assessment    INTERVENTION:  Regular diet   Glucerna Shake po TID, each supplement provides 220 kcal and 10 grams of protein    NUTRITION DIAGNOSIS:  Decreased protein needs related to CKD-3 as evidenced by current standards of care   GOAL:  Pt to meet >/= 90% of their estimated nutrition needs without excess protein intake     MONITOR:  Po intake, labs and wt trends     REASON FOR ASSESSMENT:   Low Braden    ASSESSMENT: Yolanda [name] is an obese 76 yo female who presents with metabolic encephalopathy. Her home diet is regular and she is able to feed herself at baseline. Hx includes COPD, CKD-3, Barrett's esophagus and GERD. Allergy to fish oil noted. Her three sisters help provide recent diet hx. Her po intake has been nonexistent since Saturday after lunch. Today she has sipped on water and took few sips of diet coke. She is more awake this afternoon and hopefully will be able to eat some dinner. Will add an oral nutrition supplement since her po has been poor several days now. Weight hx is stable and has ranged between 95-98 kg for past 6 months. Her nutrition focused exam was normal except for her mild edema and mild clavicle muscle depletion.     Recent Labs Lab 12/30/16 1336 12/31/16 0703 01/01/17 0904  NA 145 138 138  K 3.5 3.9 4.3  CL 105 103 101  CO2 BUN CREATININE 0.99 1.08* 1.41*  CALCIUM 9.7 8.6* 8.4*  GLUCOSE 161* 142* 191*   Labs: Cr -trending up Meds: Protonix  Diet Order:  Diet regular Room service appropriate? Yes; Fluid consistency: Thin  Skin:   (MASD)  Last BM:  unknown  Height:   Ht Readings from Last 1 Encounters:  12/31/16  (1.575 m)    Weight:   Wt Readings from Last 1 Encounters:  01/01/17 215 lb 6.2 oz (97.7 kg)    Ideal Body Weight:  50 kg  BMI:  Body mass index is 39.4 kg/m.  Estimated Nutritional Needs:   Kcal:  Based on renal adj wt 89 kg-1300-1400 (15-16 kcal/kg)  Protein:   62-71 gr (0.7-0.8 gr/kg)  Fluid:  1.5 liters daily  EDUCATION NEEDS:  None identified at this time   Yolanda Shivers Yolanda,RD,CSG,LDN Office: #[phone] Pager: [phone]

## 2017-01-01 NOTE — Progress Notes (Signed)
Patient noted wet with urine, linens changed and peri-care given. Patient cries out in pain with the slightest touch. Responds to voice, pain. Alert to self only at this time. Unable to swallow PO meds at this time. Dr.Hawkins notified and made aware.

## 2017-01-01 NOTE — Progress Notes (Signed)
Patient more alert and able to recognize family members and call them by name. A&O to self and place. Patient continues to c/o bilateral foot, leg & arm pain.

## 2017-01-02 LAB — BASIC METABOLIC PANEL
ANION GAP: 10 (ref 5–15)
BUN: 28 mg/dL — ABNORMAL HIGH (ref 6–20)
CHLORIDE: 103 mmol/L (ref 101–111)
CO2: 25 mmol/L (ref 22–32)
Calcium: 8.8 mg/dL — ABNORMAL LOW (ref 8.9–10.3)
Creatinine, Ser: 1.28 mg/dL — ABNORMAL HIGH (ref 0.44–1.00)
GFR calc non Af Amer: 40 mL/min — ABNORMAL LOW (ref >60)
GFR, EST AFRICAN AMERICAN: 47 mL/min — AB (ref >60)
Glucose, Bld: 263 mg/dL — ABNORMAL HIGH (ref 65–99)
Potassium: 4.1 mmol/L (ref 3.5–5.1)
SODIUM: 138 mmol/L (ref 135–145)

## 2017-01-02 LAB — CBC WITH DIFFERENTIAL/PLATELET
BASOS PCT: 0 %
Basophils Absolute: 0 10*3/uL (ref 0.0–0.1)
EOS PCT: 0 %
Eosinophils Absolute: 0 10*3/uL (ref 0.0–0.7)
HEMATOCRIT: 30.3 % — AB (ref 36.0–46.0)
HEMOGLOBIN: 10.1 g/dL — AB (ref 12.0–15.0)
Lymphocytes Relative: 3 %
Lymphs Abs: 0.5 10*3/uL — ABNORMAL LOW (ref 0.7–4.0)
MCH: 31.9 pg (ref 26.0–34.0)
MCHC: 33.3 g/dL (ref 30.0–36.0)
MCV: 95.6 fL (ref 78.0–100.0)
MONO ABS: 0.6 10*3/uL (ref 0.1–1.0)
MONOS PCT: 4 %
NEUTROS ABS: 14.1 10*3/uL — AB (ref 1.7–7.7)
Neutrophils Relative %: 93 %
Platelets: 142 10*3/uL — ABNORMAL LOW (ref 150–400)
RBC: 3.17 MIL/uL — ABNORMAL LOW (ref 3.87–5.11)
RDW: 14.3 % (ref 11.5–15.5)
WBC: 15.2 10*3/uL — ABNORMAL HIGH (ref 4.0–10.5)

## 2017-01-02 NOTE — Care Management Important Message (Signed)
Important Message  Patient Details  Name: JISELL MAJER MRN: [id number] Date of Birth: [birth year]   Medicare Important Message Given:  Yes    Honour Schwieger, Chrystine Oiler, RN 01/02/2017, 11:00 AM

## 2017-01-02 NOTE — Progress Notes (Signed)
Pharmacy Antibiotic Note  Yolanda [name] is a 76 y.o. female admitted on 12/30/2016 with cellulitis and UTI.  Pharmacy has been consulted for Aztreonam dosing.  Plan: Aztreonam 1gm IV q8h Also on, Clindamycin  IV q8h F/U cxs and clinical progress Monitor V/S and labs  Height:  (157.5 cm) Weight: 216 lb 4.3 oz (98.1 kg) IBW/kg (Calculated) : 50.1  Temp (24hrs), Avg:98 F (36.7 C), Min:97.6 F (36.4 C), Max:98.4 F (36.9 C)   Recent Labs Lab 12/30/16 1336 12/31/16 0703 12/31/16 0808 01/01/17 0904 01/01/17 1203 01/01/17 1425 01/02/17 0438  WBC 10.5  --  11.9* 18.9*  --   --  15.2*  CREATININE 0.99 1.08*  --  1.41*  --   --  1.28*  LATICACIDVEN  --   --   --   --  0.9 1.0  --     Estimated Creatinine Clearance: 42.8 mL/min (A) (by C-G formula based on SCr of 1.28 mg/dL (H)).    Allergies  Allergen Reactions  . Ciprofloxacin Shortness Of Breath    REACTION: sob,tachycardia  . Doxycycline Nausea Only    Also experienced diarrhea   . Fish Oil     Patient face drew to the side,Bells Palsey  . Guaifenesin Shortness Of Breath    REACTION: sob,tachycardia  . Mucinex [Guaifenesin Er] Shortness Of Breath  . Sulfamethoxazole W/Trimethoprim 800-160 [Sulfamethoxazole-Trimethoprim] Nausea Only    Also lack of appetite   . Uloric [Febuxostat] Swelling    No urination  . Adhesive [Tape] Other (See Comments)    Causes blisters on skin  . Allopurinol Other (See Comments)    Couldn't urinate  . Cefuroxime Axetil Swelling    Swelling all over body-per patient she was hospitalized as a result of taking this medication  . Metolazone Nausea Only    Swelling   . Tramadol     insomnia   Antimicrobials this admission: Aztreonam 4/8 >>  Clindamycin 4/8 >>  Unasyn 3gm IV given in ED  Dose adjustments this admission: N/A  Thank you for allowing pharmacy to be a part of this patient's care.  Valrie Hart, PharmD Clinical Pharmacist Pager:   [phone] 01/02/2017   01/02/2017 11:27 AM

## 2017-01-02 NOTE — Care Management Note (Signed)
Case Management Note  Patient Details  Name: Yolanda [name] MRN: [id number] Date of Birth: [birth year]  Subjective/Objective:   Adm with metabolic encephalopathy. From home alone.              Action/Plan: ? Sisters may plan to stay with patient at time of discharge. CM following for needs.  Expected Discharge Date:       01/04/2017           Expected Discharge Plan:  Home w Home Health Services  In-House Referral:     Discharge planning Services  CM Consult  Post Acute Care Choice:    Choice offered to:     DME Arranged:    DME Agency:     HH Arranged:    HH Agency:     Status of Service:  In process, will continue to follow  If discussed at Long Length of Stay Meetings, dates discussed:    Additional Comments:  Yolanda [name], Yolanda Oiler, RN 01/02/2017, 10:57 AM

## 2017-01-02 NOTE — Progress Notes (Signed)
Subjective: She didn't sleep last night but she is much more alert this morning. She knows where she is. She is still encephalopathic but it is not as bad. Her EEG done yesterday did not show seizures. She has no complaints. She has much less pain.  Objective: Vital signs in last 24 hours: Temp:  [97.6 F (36.4 C)-99.8 F (37.7 C)] 97.8 F (36.6 C) (04/11 0724) Pulse Rate:  [75-94] 85 (04/10 2100) Resp:  [12-19] 14 (04/10 2100) BP: (111-142)/(56-67) 136/67 (04/10 2100) SpO2:  [95 %-98 %] 96 % (04/10 2106) Weight:  [98.1 kg (216 lb 4.3 oz)] 98.1 kg (216 lb 4.3 oz) (04/11 0500) Weight change: 1.4 kg (3 lb 1.4 oz)    Intake/Output from previous day: 04/10 0701 - 04/11 0700 In: 2454 [I.V.:2000; IV Piggyback:454] Out: 950 [Urine:950]  PHYSICAL EXAM General appearance: alert, cooperative, mild distress, morbidly obese and Still mildly confused but much improved Resp: rhonchi bilaterally Cardio: regular rate and rhythm, S1, S2 normal, no murmur, click, rub or gallop GI: soft, non-tender; bowel sounds normal; no masses,  no organomegaly Extremities: Her cellulitis of the leg is much better Skin warm and dry. Mucous membranes are still dry  Lab Results:  Results for orders placed or performed during the hospital encounter of 12/30/16 (from the past 48 hour(s))  MRSA PCR Screening     Status: None   Collection Time: 12/31/16 10:08 AM  Result Value Ref Range   MRSA by PCR NEGATIVE NEGATIVE    Comment:        The GeneXpert MRSA Assay (FDA approved for NASAL specimens only), is one component of a comprehensive MRSA colonization surveillance program. It is not intended to diagnose MRSA infection nor to guide or monitor treatment for MRSA infections.   Glucose, capillary     Status: Abnormal   Collection Time: 12/31/16  1:18 PM  Result Value Ref Range   Glucose-Capillary 176 (H) 65 - 99 mg/dL  Blood gas, arterial     Status: Abnormal   Collection Time: 12/31/16  1:50 PM  Result  Value Ref Range   O2 Content 3.0 L/min   Delivery systems NASAL CANNULA    pH, Arterial 7.341 (L) 7.350 - 7.450   pCO2 arterial 51.8 (H) 32.0 - 48.0 mmHg   pO2, Arterial 98.9 83.0 - 108.0 mmHg   Bicarbonate 25.5 20.0 - 28.0 mmol/L   Acid-Base Excess 2.1 (H) 0.0 - 2.0 mmol/L   O2 Saturation 96.8 %   Patient temperature 37.0    Collection site LEFT RADIAL    Drawn by [id number]    Sample type ARTERIAL DRAW    Allens test (pass/fail) PASS PASS  Ammonia     Status: None   Collection Time: 12/31/16  3:04 PM  Result Value Ref Range   Ammonia 20 9 - 35 umol/L  Sedimentation rate     Status: Abnormal   Collection Time: 12/31/16  3:04 PM  Result Value Ref Range   Sed Rate 63 (H) 0 - 22 mm/hr  CK     Status: None   Collection Time: 12/31/16  3:04 PM  Result Value Ref Range   Total CK 42 38 - 234 U/L  Urine rapid drug screen (hosp performed)     Status: Abnormal   Collection Time: 12/31/16  5:40 PM  Result Value Ref Range   Opiates POSITIVE (A) NONE DETECTED   Cocaine NONE DETECTED NONE DETECTED   Benzodiazepines NONE DETECTED NONE DETECTED   Amphetamines NONE DETECTED NONE  DETECTED   Tetrahydrocannabinol NONE DETECTED NONE DETECTED   Barbiturates NONE DETECTED NONE DETECTED    Comment:        DRUG SCREEN FOR MEDICAL PURPOSES ONLY.  IF CONFIRMATION IS NEEDED FOR ANY PURPOSE, NOTIFY LAB WITHIN 5 DAYS.        LOWEST DETECTABLE LIMITS FOR URINE DRUG SCREEN Drug Class       Cutoff (ng/mL) Amphetamine      1000 Barbiturate      200 Benzodiazepine   948 Tricyclics       546 Opiates          300 Cocaine          300 THC              50   CBC with Differential/Platelet     Status: Abnormal   Collection Time: 01/01/17  9:04 AM  Result Value Ref Range   WBC 18.9 (H) 4.0 - 10.5 K/uL   RBC 3.36 (L) 3.87 - 5.11 MIL/uL   Hemoglobin 10.7 (L) 12.0 - 15.0 g/dL   HCT 32.3 (L) 36.0 - 46.0 %   MCV 96.1 78.0 - 100.0 fL   MCH 31.8 26.0 - 34.0 pg   MCHC 33.1 30.0 - 36.0 g/dL   RDW 14.8 11.5 -  15.5 %   Platelets 139 (L) 150 - 400 K/uL   Neutrophils Relative % 91 %   Neutro Abs 17.3 (H) 1.7 - 7.7 K/uL   Lymphocytes Relative 3 %   Lymphs Abs 0.6 (L) 0.7 - 4.0 K/uL   Monocytes Relative 6 %   Monocytes Absolute 1.0 0.1 - 1.0 K/uL   Eosinophils Relative 0 %   Eosinophils Absolute 0.0 0.0 - 0.7 K/uL   Basophils Relative 0 %   Basophils Absolute 0.0 0.0 - 0.1 K/uL  Basic metabolic panel     Status: Abnormal   Collection Time: 01/01/17  9:04 AM  Result Value Ref Range   Sodium 138 135 - 145 mmol/L   Potassium 4.3 3.5 - 5.1 mmol/L   Chloride 101 101 - 111 mmol/L   CO2 28 22 - 32 mmol/L   Glucose, Bld 191 (H) 65 - 99 mg/dL   BUN 19 6 - 20 mg/dL   Creatinine, Ser 1.41 (H) 0.44 - 1.00 mg/dL   Calcium 8.4 (L) 8.9 - 10.3 mg/dL   GFR calc non Af Amer 36 (L) >60 mL/min   GFR calc Af Amer 42 (L) >60 mL/min    Comment: (NOTE) The eGFR has been calculated using the CKD EPI equation. This calculation has not been validated in all clinical situations. eGFR's persistently <60 mL/min signify possible Chronic Kidney Disease.    Anion gap 9 5 - 15  Influenza panel by PCR (type A & B)     Status: None   Collection Time: 01/01/17 11:23 AM  Result Value Ref Range   Influenza A By PCR NEGATIVE NEGATIVE   Influenza B By PCR NEGATIVE NEGATIVE    Comment: (NOTE) The Xpert Xpress Flu assay is intended as an aid in the diagnosis of  influenza and should not be used as a sole basis for treatment.  This  assay is FDA approved for nasopharyngeal swab specimens only. Nasal  washings and aspirates are unacceptable for Xpert Xpress Flu testing.   Uric acid     Status: Abnormal   Collection Time: 01/01/17 12:03 PM  Result Value Ref Range   Uric Acid, Serum 11.8 (H) 2.3 - 6.6 mg/dL  Lactic acid, plasma     Status: None   Collection Time: 01/01/17 12:03 PM  Result Value Ref Range   Lactic Acid, Venous 0.9 0.5 - 1.9 mmol/L  Lactic acid, plasma     Status: None   Collection Time: 01/01/17  2:25 PM   Result Value Ref Range   Lactic Acid, Venous 1.0 0.5 - 1.9 mmol/L  CBC with Differential/Platelet     Status: Abnormal   Collection Time: 01/02/17  4:38 AM  Result Value Ref Range   WBC 15.2 (H) 4.0 - 10.5 K/uL   RBC 3.17 (L) 3.87 - 5.11 MIL/uL   Hemoglobin 10.1 (L) 12.0 - 15.0 g/dL   HCT 04.2 (L) 47.3 - 19.2 %   MCV 95.6 78.0 - 100.0 fL   MCH 31.9 26.0 - 34.0 pg   MCHC 33.3 30.0 - 36.0 g/dL   RDW 43.8 36.5 - 42.7 %   Platelets 142 (L) 150 - 400 K/uL   Neutrophils Relative % 93 %   Neutro Abs 14.1 (H) 1.7 - 7.7 K/uL   Lymphocytes Relative 3 %   Lymphs Abs 0.5 (L) 0.7 - 4.0 K/uL   Monocytes Relative 4 %   Monocytes Absolute 0.6 0.1 - 1.0 K/uL   Eosinophils Relative 0 %   Eosinophils Absolute 0.0 0.0 - 0.7 K/uL   Basophils Relative 0 %   Basophils Absolute 0.0 0.0 - 0.1 K/uL  Basic metabolic panel     Status: Abnormal   Collection Time: 01/02/17  4:38 AM  Result Value Ref Range   Sodium 138 135 - 145 mmol/L   Potassium 4.1 3.5 - 5.1 mmol/L   Chloride 103 101 - 111 mmol/L   CO2 25 22 - 32 mmol/L   Glucose, Bld 263 (H) 65 - 99 mg/dL   BUN 28 (H) 6 - 20 mg/dL   Creatinine, Ser 1.56 (H) 0.44 - 1.00 mg/dL   Calcium 8.8 (L) 8.9 - 10.3 mg/dL   GFR calc non Af Amer 40 (L) >60 mL/min   GFR calc Af Amer 47 (L) >60 mL/min    Comment: (NOTE) The eGFR has been calculated using the CKD EPI equation. This calculation has not been validated in all clinical situations. eGFR's persistently <60 mL/min signify possible Chronic Kidney Disease.    Anion gap 10 5 - 15    ABGS  Recent Labs  12/31/16 1350  PHART 7.341*  PO2ART 98.9  HCO3 25.5   CULTURES Recent Results (from the past 240 hour(s))  Culture, Urine     Status: Abnormal   Collection Time: 12/30/16  3:38 PM  Result Value Ref Range Status   Specimen Description URINE, CLEAN CATCH  Final   Special Requests NONE  Final   Culture MULTIPLE SPECIES PRESENT, SUGGEST RECOLLECTION (A)  Final   Report Status 01/01/2017 FINAL   Final  Culture, blood (Routine X 2) w Reflex to ID Panel     Status: None (Preliminary result)   Collection Time: 12/30/16 10:40 PM  Result Value Ref Range Status   Specimen Description BLOOD LEFT ARM  Final   Special Requests Blood Culture adequate volume  Final   Culture NO GROWTH 2 DAYS  Final   Report Status PENDING  Incomplete  Culture, blood (Routine X 2) w Reflex to ID Panel     Status: None (Preliminary result)   Collection Time: 12/30/16 10:47 PM  Result Value Ref Range Status   Specimen Description LEFT ANTECUBITAL  Final   Special Requests Blood  Culture adequate volume  Final   Culture NO GROWTH 2 DAYS  Final   Report Status PENDING  Incomplete  MRSA PCR Screening     Status: None   Collection Time: 12/31/16 10:08 AM  Result Value Ref Range Status   MRSA by PCR NEGATIVE NEGATIVE Final    Comment:        The GeneXpert MRSA Assay (FDA approved for NASAL specimens only), is one component of a comprehensive MRSA colonization surveillance program. It is not intended to diagnose MRSA infection nor to guide or monitor treatment for MRSA infections.    Studies/Results: Ct Head Wo Contrast  Result Date: 12/31/2016 CLINICAL DATA:  Altered mental status.  Recent falls. EXAM: CT HEAD WITHOUT CONTRAST TECHNIQUE: Contiguous axial images were obtained from the base of the skull through the vertex without intravenous contrast. COMPARISON:  12/30/2016 FINDINGS: Brain: There is no evidence of acute cortical infarct, intracranial hemorrhage, mass, midline shift, or extra-axial fluid collection. The ventricles and sulci are normal for age. Scattered cerebral white matter hypodensities are unchanged and nonspecific but compatible with mild chronic small vessel ischemic disease. Vascular: Calcified atherosclerosis at the skullbase. No hyperdense vessel. Skull: No fracture or focal osseous lesion. Sinuses/Orbits: Paranasal sinuses and mastoid air cells are clear. Mild bilateral exophthalmos.  Other: None. IMPRESSION: 1. No evidence of acute intracranial abnormality. 2. Mild chronic small vessel ischemic disease. Electronically Signed   By: Logan Bores M.D.   On: 12/31/2016 14:17   Dg Lumbar Spine 1 View  Result Date: 12/31/2016 CLINICAL DATA:  Multiple falls, severe pain. EXAM: LUMBAR SPINE - 1 VIEW COMPARISON:  MRI of lumbar spine April 25, 2015 FINDINGS: Single AP view of the lumbar spine. The vertebral bodies appear intact on this frontal radiograph. Severe lower lumbar facet arthropathy. No destructive bony lesions. Sacroiliac joints symmetric. No destructive bony lesions. Surgical clips in the included right abdomen compatible with cholecystectomy. IMPRESSION: Limited AP view without fracture deformity though lateral radiograph would be more sensitive. Electronically Signed   By: Elon Alas M.D.   On: 12/31/2016 14:21   US Renal  Result Date: 12/31/2016 CLINICAL DATA:  Dysuria, UTI.  Chronic kidney disease. EXAM: RENAL / URINARY TRACT ULTRASOUND COMPLETE COMPARISON:  03/19/2016 FINDINGS: Right Kidney: Length: 10.5 cm. Cortical thinning. Normal echotexture. No hydronephrosis or mass. Left Kidney: Length: 10.0 cm. Cortical thinning. Normal echotexture. No hydronephrosis or mass. Bladder: Appears normal for degree of bladder distention. IMPRESSION: No acute findings.  Mild cortical thinning.  No hydronephrosis. Electronically Signed   By: Rolm Baptise M.D.   On: 12/31/2016 12:24   Dg Hips Bilat With Pelvis Min 5 Views  Result Date: 12/31/2016 CLINICAL DATA:  Recent falls, severe pain. EXAM: DG HIP (WITH OR WITHOUT PELVIS) 5+V BILAT COMPARISON:  None. FINDINGS: There is no evidence of hip fracture or dislocation. There is no evidence of advanced arthropathy or other focal bone abnormality. 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 Mild vascular calcifications. IMPRESSION: Negative. Electronically Signed   By: Elon Alas M.D.   On: 12/31/2016 14:22     Medications:  Prior to Admission:  Prescriptions Prior to Admission  Medication Sig Dispense Refill Last Dose  . acetaminophen (TYLENOL) 500 MG tablet Take 500-1,000 mg by mouth as needed for mild pain.    unknown  . albuterol (PROVENTIL HFA;VENTOLIN HFA) 108 (90 Base) MCG/ACT inhaler Inhale 2 puffs into the lungs 4 (four) times daily.    unknown  . apixaban (ELIQUIS) 5 MG TABS  tablet Take 1 tablet (5 mg total) by mouth 2 (two) times daily. 60 tablet 5 12/30/2016 at unknown  . baclofen (LIORESAL) 20 MG tablet Take 20 mg by mouth 2 (two) times daily.  1 12/30/2016 at Unknown time  . cetirizine (ZYRTEC) 10 MG tablet Take 10 mg by mouth daily.   unknown  . cyclobenzaprine (FLEXERIL) 10 MG tablet Take 10 mg by mouth 3 (three) times daily as needed for muscle spasms.   12/30/2016 at Unknown time  . diltiazem (CARDIZEM CD) 120 MG 24 hr capsule Take 1 capsule (120 mg total) by mouth daily. 90 capsule 3 12/30/2016 at Unknown time  . fluticasone furoate-vilanterol (BREO ELLIPTA) 200-25 MCG/INH AEPB Inhale 1 puff into the lungs daily.   unknown  . gabapentin (NEURONTIN) 300 MG capsule Take 600 mg by mouth at bedtime. restless leg syndrome   12/30/2016 at Unknown time  . HYDROcodone-acetaminophen (NORCO/VICODIN) 5-325 MG tablet Take 1 tablet by mouth every 4 (four) hours as needed for moderate pain or severe pain. 10 tablet 0 12/30/2016 at Unknown time  . ibuprofen (ADVIL,MOTRIN) 200 MG tablet Take 400 mg by mouth daily as needed for mild pain or moderate pain.   unknown  . levothyroxine (SYNTHROID, LEVOTHROID) 137 MCG tablet Take 137 mcg by mouth daily before breakfast.   12/30/2016 at Unknown time  . Magnesium 250 MG TABS Take 2 tablets by mouth 2 (two) times daily.    unknown  . methylPREDNISolone (MEDROL DOSEPAK) 4 MG TBPK tablet Take 1 tablet by mouth daily as needed. For gout  2 Past Week at Unknown time  . metoprolol tartrate (LOPRESSOR) 25 MG tablet TAKE 1/2 TABLET BY MOUTH TWICE DAILY 90 tablet 1 12/30/2016 at  Unknown time  . omeprazole (PRILOSEC) 20 MG capsule TAKE ONE CAPSULE BY MOUTH DAILY 90 capsule 3 12/30/2016 at Unknown time  . potassium chloride SA (K-DUR,KLOR-CON) 20 MEQ tablet Take 1 tablet (20 mEq total) by mouth daily.   12/30/2016 at Unknown time  . torsemide (DEMADEX) 20 MG tablet Take 3 tablets (60 mg total) by mouth 2 (two) times daily. (may take an extra '20mg'$  as needed)   12/30/2016 at Unknown time  . triamcinolone (NASACORT ALLERGY 24HR) 55 MCG/ACT AERO nasal inhaler Place 2 sprays into the nose daily as needed (for allergies).    unknown   Scheduled: . apixaban  5 mg Oral BID  . aztreonam  1 g Intravenous Q8H  . chlorhexidine  15 mL Mouth Rinse BID  . clindamycin (CLEOCIN) IV  600 mg Intravenous Q8H  . feeding supplement (GLUCERNA SHAKE)  237 mL Oral TID BM  . fentaNYL (SUBLIMAZE) injection  25 mcg Intravenous Once  . fluticasone furoate-vilanterol  1 puff Inhalation Daily  . levothyroxine  75 mcg Intravenous Daily  . mouth rinse  15 mL Mouth Rinse q12n4p  . methylPREDNISolone (SOLU-MEDROL) injection  500 mg Intravenous Daily  . metoprolol  2.5 mg Intravenous Q6H  . pantoprazole (PROTONIX) IV  40 mg Intravenous Q24H   Continuous: . lactated ringers 100 mL/hr at 01/02/17 0516   YNW:[id number] **OR** acetaminophen, albuterol, naLOXone (NARCAN)  injection, ondansetron **OR** ondansetron (ZOFRAN) IV  Assesment: She was admitted with acute encephalopathy which is a metabolic encephalopathy. I don't think we have a clear picture of what it was from. Her urinalysis at this time is growing multiple organisms. It was a clean catch urine. She has improved with antibiotics so I'm not going to stop them at this point. She had cellulitis of the  leg and that looks much better. She had new medications after she fell and went to the emergency department so there may be some sort of drug drug interaction that produced this. She is on large number of medications anyway. She is off most of the meds  now she's on high-dose steroids and she is improving. Principal Problem:   Acute encephalopathy Active Problems:   Hypothyroidism   PAF (paroxysmal atrial fibrillation) (HCC)   Cellulitis of left lower extremity   Chronic anticoagulation   Acute lower UTI   Pain, dental   Spasm of back muscles   Hyperglycemia   Metabolic encephalopathy    Plan: Continue current treatments. She will remain in stepdown for now.    LOS: 3 days   Mckaila Duffus L 01/02/2017, 8:45 AM

## 2017-01-03 LAB — BASIC METABOLIC PANEL
Anion gap: 10 (ref 5–15)
BUN: 40 mg/dL — ABNORMAL HIGH (ref 6–20)
CALCIUM: 9.1 mg/dL (ref 8.9–10.3)
CHLORIDE: 100 mmol/L — AB (ref 101–111)
CO2: 26 mmol/L (ref 22–32)
CREATININE: 1.28 mg/dL — AB (ref 0.44–1.00)
GFR, EST AFRICAN AMERICAN: 47 mL/min — AB (ref >60)
GFR, EST NON AFRICAN AMERICAN: 40 mL/min — AB (ref >60)
Glucose, Bld: 323 mg/dL — ABNORMAL HIGH (ref 65–99)
Potassium: 4 mmol/L (ref 3.5–5.1)
SODIUM: 136 mmol/L (ref 135–145)

## 2017-01-03 LAB — CBC WITH DIFFERENTIAL/PLATELET
BASOS PCT: 0 %
Basophils Absolute: 0 10*3/uL (ref 0.0–0.1)
EOS ABS: 0 10*3/uL (ref 0.0–0.7)
Eosinophils Relative: 0 %
HCT: 29 % — ABNORMAL LOW (ref 36.0–46.0)
HEMOGLOBIN: 9.7 g/dL — AB (ref 12.0–15.0)
Lymphocytes Relative: 3 %
Lymphs Abs: 0.3 10*3/uL — ABNORMAL LOW (ref 0.7–4.0)
MCH: 31.6 pg (ref 26.0–34.0)
MCHC: 33.4 g/dL (ref 30.0–36.0)
MCV: 94.5 fL (ref 78.0–100.0)
MONOS PCT: 3 %
Monocytes Absolute: 0.3 10*3/uL (ref 0.1–1.0)
NEUTROS PCT: 94 %
Neutro Abs: 10.1 10*3/uL — ABNORMAL HIGH (ref 1.7–7.7)
Platelets: 180 10*3/uL (ref 150–400)
RBC: 3.07 MIL/uL — ABNORMAL LOW (ref 3.87–5.11)
RDW: 14.1 % (ref 11.5–15.5)
WBC: 10.7 10*3/uL — ABNORMAL HIGH (ref 4.0–10.5)

## 2017-01-03 LAB — GLUCOSE, CAPILLARY
GLUCOSE-CAPILLARY: 383 mg/dL — AB (ref 65–99)
GLUCOSE-CAPILLARY: 322 mg/dL — AB (ref 65–99)
Glucose-Capillary: 263 mg/dL — ABNORMAL HIGH (ref 65–99)

## 2017-01-03 MED ORDER — ALPRAZOLAM 0.25 MG PO TABS
0.2500 mg | ORAL_TABLET | Freq: Two times a day (BID) | ORAL | Status: DC
  Administered 2017-01-03 – 2017-01-07 (×8): 0.25 mg via ORAL
  Filled 2017-01-03 (×8): qty 1

## 2017-01-03 MED ORDER — DILTIAZEM HCL ER COATED BEADS 120 MG PO CP24
120.0000 mg | ORAL_CAPSULE | Freq: Every day | ORAL | Status: DC
  Administered 2017-01-03 – 2017-01-07 (×5): 120 mg via ORAL
  Filled 2017-01-03 (×4): qty 1

## 2017-01-03 MED ORDER — METOPROLOL TARTRATE 25 MG PO TABS
12.5000 mg | ORAL_TABLET | Freq: Two times a day (BID) | ORAL | Status: DC
  Administered 2017-01-03 – 2017-01-07 (×9): 12.5 mg via ORAL
  Filled 2017-01-03 (×9): qty 1

## 2017-01-03 MED ORDER — INSULIN ASPART 100 UNIT/ML ~~LOC~~ SOLN
0.0000 [IU] | Freq: Three times a day (TID) | SUBCUTANEOUS | Status: DC
  Administered 2017-01-03: 15 [IU] via SUBCUTANEOUS
  Administered 2017-01-03 – 2017-01-04 (×3): 20 [IU] via SUBCUTANEOUS
  Administered 2017-01-04: 11 [IU] via SUBCUTANEOUS
  Administered 2017-01-05 (×3): 20 [IU] via SUBCUTANEOUS
  Administered 2017-01-06: 11 [IU] via SUBCUTANEOUS
  Administered 2017-01-06 (×2): 20 [IU] via SUBCUTANEOUS
  Administered 2017-01-07: 7 [IU] via SUBCUTANEOUS
  Administered 2017-01-07: 3 [IU] via SUBCUTANEOUS

## 2017-01-03 MED ORDER — FUROSEMIDE 10 MG/ML IJ SOLN
40.0000 mg | Freq: Two times a day (BID) | INTRAMUSCULAR | Status: DC
  Administered 2017-01-03 – 2017-01-07 (×9): 40 mg via INTRAVENOUS
  Filled 2017-01-03 (×9): qty 4

## 2017-01-03 MED ORDER — POTASSIUM CHLORIDE CRYS ER 20 MEQ PO TBCR
20.0000 meq | EXTENDED_RELEASE_TABLET | Freq: Two times a day (BID) | ORAL | Status: DC
  Administered 2017-01-03 – 2017-01-07 (×9): 20 meq via ORAL
  Filled 2017-01-03 (×7): qty 1

## 2017-01-03 MED ORDER — GABAPENTIN 300 MG PO CAPS
300.0000 mg | ORAL_CAPSULE | Freq: Every day | ORAL | Status: DC
  Administered 2017-01-03 – 2017-01-05 (×3): 300 mg via ORAL
  Filled 2017-01-03 (×3): qty 1

## 2017-01-03 MED ORDER — GABAPENTIN 100 MG PO CAPS
100.0000 mg | ORAL_CAPSULE | Freq: Once | ORAL | Status: AC
  Administered 2017-01-03: 100 mg via ORAL
  Filled 2017-01-03: qty 1

## 2017-01-03 MED ORDER — POLYVINYL ALCOHOL 1.4 % OP SOLN
1.0000 [drp] | Freq: Four times a day (QID) | OPHTHALMIC | Status: DC | PRN
  Administered 2017-01-03: 1 [drp] via OPHTHALMIC
  Filled 2017-01-03 (×2): qty 15

## 2017-01-03 MED ORDER — LEVOTHYROXINE SODIUM 25 MCG PO TABS
137.0000 ug | ORAL_TABLET | Freq: Every day | ORAL | Status: DC
  Administered 2017-01-03 – 2017-01-07 (×5): 137 ug via ORAL
  Filled 2017-01-03 (×4): qty 1

## 2017-01-03 NOTE — Progress Notes (Signed)
Rehab Admissions Coordinator Note:  Patient was screened by Trish Mage for appropriateness for an Inpatient Acute Rehab Consult.  At this time, we are recommending Skilled Nursing Facility.  However, once OT and SLP have seen patient, if inpatient rehab is desired, then feel free to contact me to discuss options.  Patient does live alone and I am not sure she has adequate assistance after a potential inpatient rehab stay.  Trish Mage 01/03/2017, 1:44 PM  I can be reached at [phone].

## 2017-01-03 NOTE — [place] FL2 (Signed)
[place] MEDICAID FL2 LEVEL OF CARE SCREENING TOOL     IDENTIFICATION  Patient Name: Yolanda [name] Birthdate: 06/30/1941 Sex: female Admission Date (Current Location): 12/30/2016  [facility] and [place] Number:  [employer] and Address:  [facility],  [address] [place] [address]      Provider Number: [id number]  Attending Physician Name and Address:  Kari Baars, MD  Relative Name and Phone Number:       Current Level of Care: Hospital Recommended Level of Care: Skilled Nursing Facility Prior Approval Number:    Date Approved/Denied:   PASRR Number: [id number]  Discharge Plan: SNF    Current Diagnoses: Patient Active Problem List   Diagnosis Date Noted  . Metabolic encephalopathy 01/01/2017  . Acute encephalopathy 12/30/2016  . Acute lower UTI 12/30/2016  . Pain, dental 12/30/2016  . Spasm of back muscles 12/30/2016  . Hyperglycemia 12/30/2016  . Gout attack 09/07/2016  . Chronic anticoagulation 02/02/2016  . Chronic edema 02/02/2016  . Obesity 02/02/2016  . Sleep apnea 02/02/2016  . RBBB 02/02/2016  . Acute on chronic diastolic heart failure (HCC) 01/13/2016  . Hyponatremia 01/06/2016  . AKI (acute kidney injury) (HCC) 01/06/2016  . Cellulitis of left lower extremity 12/15/2014  . PAF (paroxysmal atrial fibrillation) (HCC) 11/09/2014  . COPD exacerbation (HCC) 11/06/2014  . Neuropathic pain 11/06/2014  . Hypothyroidism 11/06/2014  . Hyperkalemia 11/06/2014  . HTN (hypertension) 01/12/2014  . Upper airway cough syndrome 09/03/2013  . GERD (gastroesophageal reflux disease) 11/13/2011  . Short-segment Barrett's esophagus 11/13/2011    Orientation RESPIRATION BLADDER Height & Weight     Self, Time, Situation, Place  O2 (3L) Continent Weight: 213 lb 3 oz (96.7 kg) Height:   (157.5 cm)  BEHAVIORAL SYMPTOMS/MOOD NEUROLOGICAL BOWEL NUTRITION STATUS      Continent Diet (Diet Soft)  AMBULATORY STATUS COMMUNICATION OF NEEDS  Skin   Extensive Assist Verbally Normal                       Personal Care Assistance Level of Assistance  Bathing, Feeding, Dressing Bathing Assistance: Maximum assistance Feeding assistance: Limited assistance Dressing Assistance: Maximum assistance     Functional Limitations Info  Sight, Hearing, Speech Sight Info: Adequate Hearing Info: Adequate Speech Info: Adequate    SPECIAL CARE FACTORS FREQUENCY  PT (By licensed PT)     PT Frequency: 5x/week              Contractures Contractures Info: Not present    Additional Factors Info  Allergies   Allergies Info: Ciprofloxacin, Doxycycline, Fish Oil Guaifenesin, Mucinex, Sulfamethoxazole w/trimethoprim 800-160, Uloric, Adhesive Tape, Allopurinol, Cefuroxime Axetil, Metolazone, Allopurinol, Tramadol           Current Medications (01/03/2017):  This is the current hospital active medication list Current Facility-Administered Medications  Medication Dose Route Frequency Provider Last Rate Last Dose  . acetaminophen (TYLENOL) tablet 650 mg  650 mg Oral Q6H PRN Jonah Blue, MD       Or  . acetaminophen (TYLENOL) suppository 650 mg  650 mg Rectal Q6H PRN Jonah Blue, MD      . albuterol (PROVENTIL) (2.5 MG/3ML) 0.083% nebulizer solution 2.5 mg  2.5 mg Inhalation Q4H PRN Jonah Blue, MD   2.5 mg at 01/03/17 0014  . apixaban (ELIQUIS) tablet 5 mg  5 mg Oral BID Jonah Blue, MD   5 mg at 01/03/17 0857  . aztreonam (AZACTAM) 1 g in dextrose 5 % 50  mL IVPB  1 g Intravenous Q8H Jonah Blue, MD   1 g at 01/03/17 0506  . chlorhexidine (PERIDEX) 0.12 % solution 15 mL  15 mL Mouth Rinse BID Kari Baars, MD   15 mL at 01/03/17 0857  . clindamycin (CLEOCIN) IVPB 600 mg  600 mg Intravenous Q8H Jonah Blue, MD   600 mg at 01/03/17 0511  . diltiazem (CARDIZEM CD) 24 hr capsule 120 mg  120 mg Oral Daily Kari Baars, MD   120 mg at 01/03/17 0858  . feeding supplement (GLUCERNA SHAKE) (GLUCERNA SHAKE) liquid 237  mL  237 mL Oral TID BM Kari Baars, MD   237 mL at 01/03/17 0900  . fentaNYL (SUBLIMAZE) injection 25 mcg  25 mcg Intravenous Once Donnetta Hutching, MD      . fluticasone furoate-vilanterol (BREO ELLIPTA) 200-25 MCG/INH 1 puff  1 puff Inhalation Daily Jonah Blue, MD   1 puff at 01/03/17 0807  . furosemide (LASIX) injection 40 mg  40 mg Intravenous Q12H Kari Baars, MD   40 mg at 01/03/17 0901  . gabapentin (NEURONTIN) capsule 300 mg  300 mg Oral QHS Kari Baars, MD      . insulin aspart (novoLOG) injection 0-20 Units  0-20 Units Subcutaneous TID WC Kari Baars, MD   20 Units at 01/03/17 1234  . lactated ringers infusion   Intravenous Continuous Jonah Blue, MD 100 mL/hr at 01/02/17 1744    . levothyroxine (SYNTHROID, LEVOTHROID) tablet 137 mcg  137 mcg Oral QAC breakfast Kari Baars, MD   137 mcg at 01/03/17 0858  . MEDLINE mouth rinse  15 mL Mouth Rinse q12n4p Kari Baars, MD   15 mL at 01/03/17 1200  . methylPREDNISolone sodium succinate (SOLU-MEDROL) 500 mg in sodium chloride 0.9 % 50 mL IVPB  500 mg Intravenous Daily Beryle Beams, MD   500 mg at 01/03/17 1610  . metoprolol tartrate (LOPRESSOR) tablet 12.5 mg  12.5 mg Oral BID Kari Baars, MD   12.5 mg at 01/03/17 0858  . naloxone [facility]) injection 0.4 mg  0.4 mg Intravenous PRN Kari Baars, MD   0.4 mg at 12/31/16 0834  . ondansetron (ZOFRAN) tablet 4 mg  4 mg Oral Q6H PRN Jonah Blue, MD       Or  . ondansetron [facility]) injection 4 mg  4 mg Intravenous Q6H PRN Jonah Blue, MD   4 mg at 12/30/16 2158  . pantoprazole (PROTONIX) injection 40 mg  40 mg Intravenous Q24H Kari Baars, MD   40 mg at 01/03/17 0857  . potassium chloride SA (K-DUR,KLOR-CON) CR tablet 20 mEq  20 mEq Oral BID Kari Baars, MD   20 mEq at 01/03/17 9604     Discharge Medications: Please see discharge summary for a list of discharge medications.  Relevant Imaging Results:  Relevant Lab Results:   Additional Information SSN [id number]  [address] Juleen China, LCSW

## 2017-01-03 NOTE — Progress Notes (Signed)
Inpatient Diabetes Program Recommendations  AACE/ADA: New Consensus Statement on Inpatient Glycemic Control (2015)  Target Ranges:  Prepandial:   less than 140 mg/dL      Peak postprandial:   less than 180 mg/dL (1-2 hours)      Critically ill patients:  140 - 180 mg/dL   Lab Results  Component Value Date   GLUCAP 176 (H) 12/31/2016   HGBA1C 6.9 (H) 11/12/2014    Review of Glycemic ControlResults for LESTER, PLATAS (MRN [id number]) as of 01/03/2017 10:47  Ref. Range 12/31/2016 07:03 01/01/2017 09:04 01/02/2017 04:38 01/03/2017 04:23  Glucose Latest Ref Range: 65 - 99 mg/dL 782 (H) 956 (H) 213 (H) 323 (H)   Diabetes history: None noted Outpatient Diabetes medications: None Current orders for Inpatient glycemic control:  Solumedrol 500 mg IV daily Inpatient Diabetes Program Recommendations:    Note that blood sugars increased with IV Solumedrol.  Consider checking CBG's tid with meals and HS with Novolog moderate correction.  Also consider changing diet to CHO modified.   Thanks, Beryl Meager, RN, BC-ADM Inpatient Diabetes Coordinator Pager [phone] (8a-5p)

## 2017-01-03 NOTE — Progress Notes (Signed)
Subjective: She is better as far as her confusion. She is still confused. She still somewhat agitated. I think some of this now may be related to coming off of some of the medications. I will call her drugstore for current medication list because I think she has been on a benzodiazepine and she could be having withdrawal now. No other new complaints. She still complains of pain but it is better. Her breathing is okay.  Objective: Vital signs in last 24 hours: Temp:  [97.9 F (36.6 C)-98.9 F (37.2 C)] 98.3 F (36.8 C) (04/12 0723) Pulse Rate:  [87-99] 96 (04/12 0700) Resp:  [16-27] 24 (04/12 0700) BP: (109-142)/(58-85) 132/66 (04/12 0700) SpO2:  [92 %-99 %] 99 % (04/12 0700) Weight:  [96.7 kg (213 lb 3 oz)] 96.7 kg (213 lb 3 oz) (04/12 0500) Weight change: -1.4 kg (-3 lb 1.4 oz) Last BM Date: 01/02/17  Intake/Output from previous day: 04/11 0701 - 04/12 0700 In: 4940 [P.O.:540; I.V.:4000; IV Piggyback:400] Out: 900 [Urine:900]  PHYSICAL EXAM General appearance: alert, cooperative and morbidly obese Resp: clear to auscultation bilaterally Cardio: regular rate and rhythm, S1, S2 normal, no murmur, click, rub or gallop GI: Abdomen soft no masses Extremities: Her cellulitis is much better Skin warm and dry. Still confused. She has some repetitive motion of her chin  Lab Results:  Results for orders placed or performed during the hospital encounter of 12/30/16 (from the past 48 hour(s))  CBC with Differential/Platelet     Status: Abnormal   Collection Time: 01/01/17  9:04 AM  Result Value Ref Range   WBC 18.9 (H) 4.0 - 10.5 K/uL   RBC 3.36 (L) 3.87 - 5.11 MIL/uL   Hemoglobin 10.7 (L) 12.0 - 15.0 g/dL   HCT 32.3 (L) 36.0 - 46.0 %   MCV 96.1 78.0 - 100.0 fL   MCH 31.8 26.0 - 34.0 pg   MCHC 33.1 30.0 - 36.0 g/dL   RDW 14.8 11.5 - 15.5 %   Platelets 139 (L) 150 - 400 K/uL   Neutrophils Relative % 91 %   Neutro Abs 17.3 (H) 1.7 - 7.7 K/uL   Lymphocytes Relative 3 %   Lymphs Abs  0.6 (L) 0.7 - 4.0 K/uL   Monocytes Relative 6 %   Monocytes Absolute 1.0 0.1 - 1.0 K/uL   Eosinophils Relative 0 %   Eosinophils Absolute 0.0 0.0 - 0.7 K/uL   Basophils Relative 0 %   Basophils Absolute 0.0 0.0 - 0.1 K/uL  Basic metabolic panel     Status: Abnormal   Collection Time: 01/01/17  9:04 AM  Result Value Ref Range   Sodium 138 135 - 145 mmol/L   Potassium 4.3 3.5 - 5.1 mmol/L   Chloride 101 101 - 111 mmol/L   CO2 28 22 - 32 mmol/L   Glucose, Bld 191 (H) 65 - 99 mg/dL   BUN 19 6 - 20 mg/dL   Creatinine, Ser 1.41 (H) 0.44 - 1.00 mg/dL   Calcium 8.4 (L) 8.9 - 10.3 mg/dL   GFR calc non Af Amer 36 (L) >60 mL/min   GFR calc Af Amer 42 (L) >60 mL/min    Comment: (NOTE) The eGFR has been calculated using the CKD EPI equation. This calculation has not been validated in all clinical situations. eGFR's persistently <60 mL/min signify possible Chronic Kidney Disease.    Anion gap 9 5 - 15  Influenza panel by PCR (type A & B)     Status: None  Collection Time: 01/01/17 11:23 AM  Result Value Ref Range   Influenza A By PCR NEGATIVE NEGATIVE   Influenza B By PCR NEGATIVE NEGATIVE    Comment: (NOTE) The Xpert Xpress Flu assay is intended as an aid in the diagnosis of  influenza and should not be used as a sole basis for treatment.  This  assay is FDA approved for nasopharyngeal swab specimens only. Nasal  washings and aspirates are unacceptable for Xpert Xpress Flu testing.   Uric acid     Status: Abnormal   Collection Time: 01/01/17 12:03 PM  Result Value Ref Range   Uric Acid, Serum 11.8 (H) 2.3 - 6.6 mg/dL  Lactic acid, plasma     Status: None   Collection Time: 01/01/17 12:03 PM  Result Value Ref Range   Lactic Acid, Venous 0.9 0.5 - 1.9 mmol/L  Lactic acid, plasma     Status: None   Collection Time: 01/01/17  2:25 PM  Result Value Ref Range   Lactic Acid, Venous 1.0 0.5 - 1.9 mmol/L  CBC with Differential/Platelet     Status: Abnormal   Collection Time: 01/02/17   4:38 AM  Result Value Ref Range   WBC 15.2 (H) 4.0 - 10.5 K/uL   RBC 3.17 (L) 3.87 - 5.11 MIL/uL   Hemoglobin 10.1 (L) 12.0 - 15.0 g/dL   HCT 30.3 (L) 36.0 - 46.0 %   MCV 95.6 78.0 - 100.0 fL   MCH 31.9 26.0 - 34.0 pg   MCHC 33.3 30.0 - 36.0 g/dL   RDW 14.3 11.5 - 15.5 %   Platelets 142 (L) 150 - 400 K/uL   Neutrophils Relative % 93 %   Neutro Abs 14.1 (H) 1.7 - 7.7 K/uL   Lymphocytes Relative 3 %   Lymphs Abs 0.5 (L) 0.7 - 4.0 K/uL   Monocytes Relative 4 %   Monocytes Absolute 0.6 0.1 - 1.0 K/uL   Eosinophils Relative 0 %   Eosinophils Absolute 0.0 0.0 - 0.7 K/uL   Basophils Relative 0 %   Basophils Absolute 0.0 0.0 - 0.1 K/uL  Basic metabolic panel     Status: Abnormal   Collection Time: 01/02/17  4:38 AM  Result Value Ref Range   Sodium 138 135 - 145 mmol/L   Potassium 4.1 3.5 - 5.1 mmol/L   Chloride 103 101 - 111 mmol/L   CO2 25 22 - 32 mmol/L   Glucose, Bld 263 (H) 65 - 99 mg/dL   BUN 28 (H) 6 - 20 mg/dL   Creatinine, Ser 1.28 (H) 0.44 - 1.00 mg/dL   Calcium 8.8 (L) 8.9 - 10.3 mg/dL   GFR calc non Af Amer 40 (L) >60 mL/min   GFR calc Af Amer 47 (L) >60 mL/min    Comment: (NOTE) The eGFR has been calculated using the CKD EPI equation. This calculation has not been validated in all clinical situations. eGFR's persistently <60 mL/min signify possible Chronic Kidney Disease.    Anion gap 10 5 - 15  CBC with Differential/Platelet     Status: Abnormal   Collection Time: 01/03/17  4:23 AM  Result Value Ref Range   WBC 10.7 (H) 4.0 - 10.5 K/uL   RBC 3.07 (L) 3.87 - 5.11 MIL/uL   Hemoglobin 9.7 (L) 12.0 - 15.0 g/dL   HCT 29.0 (L) 36.0 - 46.0 %   MCV 94.5 78.0 - 100.0 fL   MCH 31.6 26.0 - 34.0 pg   MCHC 33.4 30.0 - 36.0 g/dL  RDW 14.1 11.5 - 15.5 %   Platelets 180 150 - 400 K/uL   Neutrophils Relative % 94 %   Neutro Abs 10.1 (H) 1.7 - 7.7 K/uL   Lymphocytes Relative 3 %   Lymphs Abs 0.3 (L) 0.7 - 4.0 K/uL   Monocytes Relative 3 %   Monocytes Absolute 0.3 0.1 -  1.0 K/uL   Eosinophils Relative 0 %   Eosinophils Absolute 0.0 0.0 - 0.7 K/uL   Basophils Relative 0 %   Basophils Absolute 0.0 0.0 - 0.1 K/uL  Basic metabolic panel     Status: Abnormal   Collection Time: 01/03/17  4:23 AM  Result Value Ref Range   Sodium 136 135 - 145 mmol/L   Potassium 4.0 3.5 - 5.1 mmol/L   Chloride 100 (L) 101 - 111 mmol/L   CO2 26 22 - 32 mmol/L   Glucose, Bld 323 (H) 65 - 99 mg/dL   BUN 40 (H) 6 - 20 mg/dL   Creatinine, Ser 1.28 (H) 0.44 - 1.00 mg/dL   Calcium 9.1 8.9 - 10.3 mg/dL   GFR calc non Af Amer 40 (L) >60 mL/min   GFR calc Af Amer 47 (L) >60 mL/min    Comment: (NOTE) The eGFR has been calculated using the CKD EPI equation. This calculation has not been validated in all clinical situations. eGFR's persistently <60 mL/min signify possible Chronic Kidney Disease.    Anion gap 10 5 - 15    ABGS  Recent Labs  12/31/16 1350  PHART 7.341*  PO2ART 98.9  HCO3 25.5   CULTURES Recent Results (from the past 240 hour(s))  Culture, Urine     Status: Abnormal   Collection Time: 12/30/16  3:38 PM  Result Value Ref Range Status   Specimen Description URINE, CLEAN CATCH  Final   Special Requests NONE  Final   Culture MULTIPLE SPECIES PRESENT, SUGGEST RECOLLECTION (A)  Final   Report Status 01/01/2017 FINAL  Final  Culture, blood (Routine X 2) w Reflex to ID Panel     Status: None (Preliminary result)   Collection Time: 12/30/16 10:40 PM  Result Value Ref Range Status   Specimen Description BLOOD LEFT ARM  Final   Special Requests Blood Culture adequate volume  Final   Culture NO GROWTH 3 DAYS  Final   Report Status PENDING  Incomplete  Culture, blood (Routine X 2) w Reflex to ID Panel     Status: None (Preliminary result)   Collection Time: 12/30/16 10:47 PM  Result Value Ref Range Status   Specimen Description LEFT ANTECUBITAL  Final   Special Requests Blood Culture adequate volume  Final   Culture NO GROWTH 3 DAYS  Final   Report Status  PENDING  Incomplete  MRSA PCR Screening     Status: None   Collection Time: 12/31/16 10:08 AM  Result Value Ref Range Status   MRSA by PCR NEGATIVE NEGATIVE Final    Comment:        The GeneXpert MRSA Assay (FDA approved for NASAL specimens only), is one component of a comprehensive MRSA colonization surveillance program. It is not intended to diagnose MRSA infection nor to guide or monitor treatment for MRSA infections.    Studies/Results: No results found.  Medications:  Prior to Admission:  Prescriptions Prior to Admission  Medication Sig Dispense Refill Last Dose  . acetaminophen (TYLENOL) 500 MG tablet Take 500-1,000 mg by mouth as needed for mild pain.    unknown  . albuterol (PROVENTIL  HFA;VENTOLIN HFA) 108 (90 Base) MCG/ACT inhaler Inhale 2 puffs into the lungs 4 (four) times daily.    unknown  . apixaban (ELIQUIS) 5 MG TABS tablet Take 1 tablet (5 mg total) by mouth 2 (two) times daily. 60 tablet 5 12/30/2016 at unknown  . baclofen (LIORESAL) 20 MG tablet Take 20 mg by mouth 2 (two) times daily.  1 12/30/2016 at Unknown time  . cetirizine (ZYRTEC) 10 MG tablet Take 10 mg by mouth daily.   unknown  . cyclobenzaprine (FLEXERIL) 10 MG tablet Take 10 mg by mouth 3 (three) times daily as needed for muscle spasms.   12/30/2016 at Unknown time  . diltiazem (CARDIZEM CD) 120 MG 24 hr capsule Take 1 capsule (120 mg total) by mouth daily. 90 capsule 3 12/30/2016 at Unknown time  . fluticasone furoate-vilanterol (BREO ELLIPTA) 200-25 MCG/INH AEPB Inhale 1 puff into the lungs daily.   unknown  . gabapentin (NEURONTIN) 300 MG capsule Take 600 mg by mouth at bedtime. restless leg syndrome   12/30/2016 at Unknown time  . HYDROcodone-acetaminophen (NORCO/VICODIN) 5-325 MG tablet Take 1 tablet by mouth every 4 (four) hours as needed for moderate pain or severe pain. 10 tablet 0 12/30/2016 at Unknown time  . ibuprofen (ADVIL,MOTRIN) 200 MG tablet Take 400 mg by mouth daily as needed for mild pain or  moderate pain.   unknown  . levothyroxine (SYNTHROID, LEVOTHROID) 137 MCG tablet Take 137 mcg by mouth daily before breakfast.   12/30/2016 at Unknown time  . Magnesium 250 MG TABS Take 2 tablets by mouth 2 (two) times daily.    unknown  . methylPREDNISolone (MEDROL DOSEPAK) 4 MG TBPK tablet Take 1 tablet by mouth daily as needed. For gout  2 Past Week at Unknown time  . metoprolol tartrate (LOPRESSOR) 25 MG tablet TAKE 1/2 TABLET BY MOUTH TWICE DAILY 90 tablet 1 12/30/2016 at Unknown time  . omeprazole (PRILOSEC) 20 MG capsule TAKE ONE CAPSULE BY MOUTH DAILY 90 capsule 3 12/30/2016 at Unknown time  . potassium chloride SA (K-DUR,KLOR-CON) 20 MEQ tablet Take 1 tablet (20 mEq total) by mouth daily.   12/30/2016 at Unknown time  . torsemide (DEMADEX) 20 MG tablet Take 3 tablets (60 mg total) by mouth 2 (two) times daily. (may take an extra 30m as needed)   12/30/2016 at Unknown time  . triamcinolone (NASACORT ALLERGY 24HR) 55 MCG/ACT AERO nasal inhaler Place 2 sprays into the nose daily as needed (for allergies).    unknown   Scheduled: . apixaban  5 mg Oral BID  . aztreonam  1 g Intravenous Q8H  . chlorhexidine  15 mL Mouth Rinse BID  . clindamycin (CLEOCIN) IV  600 mg Intravenous Q8H  . diltiazem  120 mg Oral Daily  . feeding supplement (GLUCERNA SHAKE)  237 mL Oral TID BM  . fentaNYL (SUBLIMAZE) injection  25 mcg Intravenous Once  . fluticasone furoate-vilanterol  1 puff Inhalation Daily  . furosemide  40 mg Intravenous Q12H  . gabapentin  100 mg Oral Once  . gabapentin  300 mg Oral QHS  . levothyroxine  137 mcg Oral QAC breakfast  . mouth rinse  15 mL Mouth Rinse q12n4p  . methylPREDNISolone (SOLU-MEDROL) injection  500 mg Intravenous Daily  . metoprolol tartrate  12.5 mg Oral BID  . pantoprazole (PROTONIX) IV  40 mg Intravenous Q24H  . potassium chloride  20 mEq Oral BID   Continuous: . lactated ringers 100 mL/hr at 01/02/17 1744   PIEP:PIRJJOACZYSAY**OR** acetaminophen,  albuterol, naLOXone  (NARCAN)  injection, ondansetron **OR** ondansetron (ZOFRAN) IV  Assesment: She was admitted with acute metabolic encephalopathy. This was thought to be related to urinary tract infection and may be light her urine grew multiple colonies. She also had cellulitis of her leg and that's improving. She is generally better. She was on gabapentin at home and I'm going to reinstitute that now. She was on I think a benzo at home and I'm going to check with her drugstore that she may have withdrawal from that causing some of her problem now. She is clearly improving but not back to baseline. Principal Problem:   Acute encephalopathy Active Problems:   Hypothyroidism   PAF (paroxysmal atrial fibrillation) (HCC)   Cellulitis of left lower extremity   Chronic anticoagulation   Acute lower UTI   Pain, dental   Spasm of back muscles   Hyperglycemia   Metabolic encephalopathy    Plan: As above. Request physical therapy consultation    LOS: 4 days   Talyah Seder L 01/03/2017, 8:04 AM

## 2017-01-03 NOTE — Evaluation (Addendum)
Physical Therapy Evaluation Patient Details Name: MILICA GULLY MRN: [id number] DOB: April 13, 1941 Today's Date: 01/03/2017   History of Present Illness  76 y.o. female with medical history significant of Afib on Eliquis, Stage 3 CKD, hypothyroidism, GERD, HTN, diastolic heart failure, and COPD presenting with AMS.  The patient reports that she first noticed a jagged edge on her tooth on Thursday night.  However, her sister reports that she has actually been seeing a dentist about this issue and has had a procedure for it but it has continued to bother her.  Her sister then reports that actually on Thursday night, she was having problems with her back spasms and came to the ER here; she had previously received Flexeril and Baclofen from Dr. Juanetta Gosling for this issue and sent home with those and Vicodin.  No medication since yesterday but still having spasms.  Overnight, she got very "loopy", confused, usually very lucid.  Sometimes comes and goes but it has intensified since it started.  Repeating herself and "it's backtracking and it's just messed up."  Still having back spasms.  No urinary symptoms per the patient.  "They took care of my pee problem."  However, her sister reports that she did wet the bed last night and wouldn't wear underwear today.  Dx:  Multifactorial toxic metabolic encephalopathy: Etiologies includes acute urinary tract infection, acute cellulitis and medication effect.    Clinical Impression  Pt received in bed, sisters present, and pt is agreeable to PT evaluation.  Pt normally lives alone, and is independent with ambulation, but will occasionally use a cane.  She was independent with dressing, bathing, driving, and all IADL's.  She has had multiple falls in the past 6 months.  During today's PT evaluation, she demonstrates generalized weakness in all 4 extremities, as well as edema in all extremities.  She is hypersensitive to light touch especially on L LE where cellulitis is located,  as well as B UE's and hands.  She demonstrates apraxia with functional mobility and requires increased time, verbal, and tactile cues to perform activities.  She requires assistance for initiation of all functional mobility.  Attempted sit<>Stand with RW several times, but pt is not able to motor plan how to perform this task.  Therefore, STEDY used with +2 person Max A for sit<>stand transfer bed<>chair.  Throughout evaluation, pt also demonstrates echolalia, and becomes fixated on different phrases.  Pt is a high risk for falling, and currently is requiring a high level of assistance to perform all functional mobility tasks, and she has a supportive family.  At this point, she is recommended for CIR as pt was extremely independent prior to admission.  She will need an OT evaluation, which has been ordered, and recommend SLP evaluation for language and cognition.      Follow Up Recommendations CIR;SNF    Equipment Recommendations  None recommended by PT    Recommendations for Other Services OT consult;Speech consult (Not for swallowing, but for cognition and language.  )     Precautions / Restrictions Precautions Precautions: Fall Precaution Comments: 3 falls - at church, down the steps, and then just prior to admission - fell off a stool in the bathroom.  Restrictions Weight Bearing Restrictions: No      Mobility  Bed Mobility Overal bed mobility: Needs Assistance Bed Mobility: Supine to Sit     Supine to sit: Max assist;HOB elevated     General bed mobility comments: Increased time with assistance to progress each extremity.  Once sitting on the EOB pt holds UE's in guarded position, and despite verbal and tactile cues to use UE's to push herself forward she is not able to, and bed pad used to assist pt's hips to the EOB.    Transfers Overall transfer level: Needs assistance Equipment used: Rolling walker (2 wheeled) Transfers: Sit to/from Stand Sit to Stand: Max assist;+2  physical assistance         General transfer comment: Attempted 3x's to perform sit<>stand from ICU bed.  Pt is unable to lift buttocks up off EOB.  Therefore STEDY used and +2 person assist for transfer bed<>chair.  Pt becomes very fearful during transfer.    Ambulation/Gait Ambulation/Gait assistance:  (NA )              Stairs            Wheelchair Mobility    Modified Rankin (Stroke Patients Only)       Balance Overall balance assessment: History of Falls;Needs assistance Sitting-balance support: Feet supported Sitting balance-Leahy Scale: Fair Sitting balance - Comments: Pt requires supervision - holds hands in guarded position   Standing balance support: Bilateral upper extremity supported Standing balance-Leahy Scale: Zero                               Pertinent Vitals/Pain Pain Assessment: Faces Faces Pain Scale: Hurts whole lot Pain Location: Pt screaming in pain with light touch to the L LE, and B UE's/hands.   Pain Intervention(s): Limited activity within patient's tolerance;Monitored during session;Repositioned    Home Living   Living Arrangements: Alone Available Help at Discharge:  (house cleaner) Type of Home: House Home Access: Stairs to enter Entrance Stairs-Rails: None Entrance Stairs-Number of Steps: 4-5 Home Layout: One level Home Equipment: Cane - single point      Prior Function     Gait / Transfers Assistance Needed: Pt states she occasionally uses the cane for ambulation   ADL's / Homemaking Assistance Needed: independent  Comments: Driving - Pt is normally able to ambulate community distances.       Hand Dominance   Dominant Hand: Right    Extremity/Trunk Assessment   Upper Extremity Assessment Upper Extremity Assessment: Generalized weakness;RUE deficits/detail;LUE deficits/detail RUE Deficits / Details: generalized weakness with increased stiffness in fingers, and tend to stay in flexed positioned.   Edematous.  Educated pt and family on moving hands frequently, and propping pillows under UE's.  Apraxia noted with all movement RUE Sensation:  (increased sensitivty to light touch) RUE Coordination: decreased fine motor;decreased gross motor LUE Deficits / Details: generalized weakness with increased stiffness in fingers, and tend to stay in flexed positioned.  Edematous.  Educated pt and family on moving hands frequently, and propping pillows under UE's.  Apraxia noted with all movement LUE Sensation:  (increased sensitivty to light touch) LUE Coordination: decreased fine motor;decreased gross motor    Lower Extremity Assessment Lower Extremity Assessment: Generalized weakness;RLE deficits/detail;LLE deficits/detail RLE Deficits / Details: LE's are edematous, and painful to light touch.  generalized weakness. Apraxia noted with all movement RLE Sensation:  (increased sensitivty to light touch) RLE Coordination: decreased fine motor;decreased gross motor LLE Deficits / Details: LE's are edematous, and painful to light touch.  generalized weakness. Apraxia noted with all movement LLE Sensation:  (increased sensitivty to light touch) LLE Coordination: decreased fine motor;decreased gross motor    Cervical / Trunk Assessment Cervical / Trunk Assessment: Kyphotic  Communication   Communication: Other (comment) (slowed and very soft spoken )  Cognition Arousal/Alertness: Lethargic Behavior During Therapy: Flat affect Overall Cognitive Status: Within Functional Limits for tasks assessed                                 General Comments: However pt demonstrates echolalia, and becomes fixated on objects she sees in the room.  She also demonstrates inability to follow commands due to what seems like apraxia.       General Comments      Exercises     Assessment/Plan    PT Assessment Patient needs continued PT services  PT Problem List Decreased strength;Decreased activity  tolerance;Decreased range of motion;Decreased balance;Decreased mobility;Decreased coordination;Decreased cognition;Decreased knowledge of use of DME;Decreased safety awareness;Decreased knowledge of precautions;Obesity;Pain;Impaired sensation       PT Treatment Interventions DME instruction;Gait training;Functional mobility training;Therapeutic activities;Therapeutic exercise;Balance training;Cognitive remediation;Patient/family education;Neuromuscular re-education;Wheelchair mobility training    PT Goals (Current goals can be found in the Care Plan section)  Acute Rehab PT Goals Patient Stated Goal: To get stronger PT Goal Formulation: With patient/family Time For Goal Achievement: 01/17/17 Potential to Achieve Goals: Good    Frequency Min 6X/week   Barriers to discharge Decreased caregiver support Pt lives alone, but has a very supportive family with sisters present during evaluation.      Co-evaluation               End of Session Equipment Utilized During Treatment: Oxygen;Gait belt (STEDY) Activity Tolerance: Patient limited by pain;Patient limited by fatigue Patient left: in chair;with call bell/phone within reach;with family/visitor present Nurse Communication: Mobility status;Need for lift equipment (mobility sheet left hanging in room, and Maxi sky sling under pt in the chair.  ) PT Visit Diagnosis: Other abnormalities of gait and mobility (R26.89);Muscle weakness (generalized) (M62.81);History of falling (Z91.81);Apraxia (R48.2);Other symptoms and signs involving the nervous system (R29.898)    Time: 6962-9528 PT Time Calculation (min) (ACUTE ONLY): 53 min   Charges:   PT Evaluation $PT Eval Moderate Complexity: 1 Procedure PT Treatments $Therapeutic Activity: 38-52 mins   PT G Codes:   PT G-Codes **NOT FOR INPATIENT CLASS** Functional Assessment Tool Used: AM-PAC 6 Clicks Basic Mobility;Clinical judgement Functional Limitation: Mobility: Walking and moving  around Mobility: Walking and Moving Around Current Status (U1324): At least 60 percent but less than 80 percent impaired, limited or restricted Mobility: Walking and Moving Around Goal Status [phone]): At least 40 percent but less than 60 percent impaired, limited or restricted    Beth Mahaila Tischer, PT, DPT X: [phone]

## 2017-01-03 NOTE — Progress Notes (Signed)
1058 Spoke with Victorino Dike (Diabetes Coordinator) regarding patient's elevated blood glucose on lab work. Dr.Hawkins notified and the following new orders given:  1.) Accuchecks AC/HS 2.) Novolog resistant sliding scale insulin order set

## 2017-01-04 ENCOUNTER — Encounter (INDEPENDENT_AMBULATORY_CARE_PROVIDER_SITE_OTHER): Payer: Self-pay | Admitting: Internal Medicine

## 2017-01-04 LAB — CULTURE, BLOOD (ROUTINE X 2)
CULTURE: NO GROWTH
CULTURE: NO GROWTH
Special Requests: ADEQUATE
Special Requests: ADEQUATE

## 2017-01-04 LAB — GLUCOSE, CAPILLARY
GLUCOSE-CAPILLARY: 271 mg/dL — AB (ref 65–99)
GLUCOSE-CAPILLARY: 402 mg/dL — AB (ref 65–99)
Glucose-Capillary: 418 mg/dL — ABNORMAL HIGH (ref 65–99)

## 2017-01-04 MED ORDER — PANTOPRAZOLE SODIUM 40 MG PO TBEC
40.0000 mg | DELAYED_RELEASE_TABLET | Freq: Every day | ORAL | Status: DC
  Administered 2017-01-05 – 2017-01-07 (×3): 40 mg via ORAL
  Filled 2017-01-04 (×3): qty 1

## 2017-01-04 MED ORDER — METHYLPREDNISOLONE SODIUM SUCC 40 MG IJ SOLR
40.0000 mg | Freq: Two times a day (BID) | INTRAMUSCULAR | Status: DC
  Administered 2017-01-04 – 2017-01-05 (×4): 40 mg via INTRAVENOUS
  Filled 2017-01-04 (×5): qty 1

## 2017-01-04 MED ORDER — INSULIN GLARGINE 100 UNIT/ML ~~LOC~~ SOLN
10.0000 [IU] | Freq: Every day | SUBCUTANEOUS | Status: DC
  Administered 2017-01-04 – 2017-01-05 (×2): 10 [IU] via SUBCUTANEOUS
  Filled 2017-01-04 (×4): qty 0.1

## 2017-01-04 NOTE — Clinical Social Work Note (Signed)
Clinical Social Work Assessment  Patient Details  Name: Yolanda [name] MRN: [id number] Date of Birth: 07-13-1941  Date of referral:  01/04/17               Reason for consult:  Facility Placement, Discharge Planning                Permission sought to share information with:  Facility Sport and exercise psychologist, Family Supports Permission granted to share information::  Yes, Verbal Permission Granted  Name::        Agency::     Relationship::     Contact Information:  sisters Lucius Conn [phone]  [phone], Francis Dowse [phone], Precious Haws [phone]  Housing/Transportation Living arrangements for the past 2 months:  [place] of Information:  Patient, Other (Comment Required) (sisters) Patient Interpreter Needed:  None Criminal Activity/Legal Involvement Pertinent to Current Situation/Hospitalization:  No - Comment as needed Significant Relationships:  Siblings, Adult Children, Other Family Members Lives with:  Self Do you feel safe going back to the place where you live?  Yes Need for family participation in patient care:  Yes (Comment) (sisters support pt in decision making)  Care giving concerns:  Pt from home where she lives alone, at baseline independent with ADLs and ambulation. Currently requiring assistance, PT has consulted and recommends SNF for ST rehab.  Pt has strong family support and family involvement.    Social Worker assessment / plan:  CSW consulted due to pt needing SNF.  Met with pt at bedside. Pt's sisters also present. CSW explained role and reason for involvement. Pt expresses willingness to pursue SNF and preference for [place].   CSW made referrals through [facility] to area SNFs. Pt will require insurance authorization.  Employment status:  Retired Nurse, adult PT Recommendations:  [place] / Referral to community resources:  [place]  Patient/Family's Response to care: patient and family engaged and appreciative of assistance  Patient/Family's Understanding of and Emotional Response to Diagnosis, Current Treatment, and Prognosis:  Pt and family display adequate understanding of pt's treatment and plan. Are positive about prognosis and hopeful that pt will improve and return home. Pt states, "I think rehab is the best option for me."   Emotional Assessment Appearance:  Appears stated age Attitude/Demeanor/Rapport:   (pleasant, engaged) Affect (typically observed):  Accepting, Adaptable, Appropriate Orientation:  Oriented to Self, Oriented to Place, Oriented to  Time, Oriented to Situation Alcohol / Substance use:  Not Applicable Psych involvement (Current and /or in the community):  No (Comment)  Discharge Needs  Concerns to be addressed:  No discharge needs identified Readmission within the last 30 days:  No Current discharge risk:  None Barriers to Discharge:  Continued Medical Work up, [employer], LCSW 01/04/2017, 3:00 PM

## 2017-01-04 NOTE — Progress Notes (Signed)
Subjective: She has improved. She is much less confused. She had a good night. She slept last night. She has no other new complaints. I don't think it's totally clear still exactly what happened. She had metabolic encephalopathy whether it's from urinary tract infection cellulitis medication effect etc. At any rate she has improved. I added back some of her baseline medications yesterday and I think that may have helped  Objective: Vital signs in last 24 hours: Temp:  [97.5 F (36.4 C)-98.5 F (36.9 C)] 97.8 F (36.6 C) (04/13 0400) Pulse Rate:  [60-129] 75 (04/13 0600) Resp:  [12-24] 15 (04/13 0600) BP: (99-129)/(46-81) 123/63 (04/13 0600) SpO2:  [79 %-100 %] 97 % (04/13 0600) Weight change:  Last BM Date: 01/02/17  Intake/Output from previous day: 04/12 0701 - 04/13 0700 In: 2490 [P.O.:240; I.V.:2000; IV Piggyback:250] Out: 2200 [Urine:2200]  PHYSICAL EXAM General appearance: alert, cooperative, no distress and morbidly obese Resp: clear to auscultation bilaterally Cardio: regular rate and rhythm, S1, S2 normal, no murmur, click, rub or gallop GI: soft, non-tender; bowel sounds normal; no masses,  no organomegaly Extremities: Cellulitis is much improved Skin warm and dry cellulitis is better. Mucous membranes are moist  Lab Results:  Results for orders placed or performed during the hospital encounter of 12/30/16 (from the past 48 hour(s))  CBC with Differential/Platelet     Status: Abnormal   Collection Time: 01/03/17  4:23 AM  Result Value Ref Range   WBC 10.7 (H) 4.0 - 10.5 K/uL   RBC 3.07 (L) 3.87 - 5.11 MIL/uL   Hemoglobin 9.7 (L) 12.0 - 15.0 g/dL   HCT 29.0 (L) 36.0 - 46.0 %   MCV 94.5 78.0 - 100.0 fL   MCH 31.6 26.0 - 34.0 pg   MCHC 33.4 30.0 - 36.0 g/dL   RDW 14.1 11.5 - 15.5 %   Platelets 180 150 - 400 K/uL   Neutrophils Relative % 94 %   Neutro Abs 10.1 (H) 1.7 - 7.7 K/uL   Lymphocytes Relative 3 %   Lymphs Abs 0.3 (L) 0.7 - 4.0 K/uL   Monocytes Relative 3  %   Monocytes Absolute 0.3 0.1 - 1.0 K/uL   Eosinophils Relative 0 %   Eosinophils Absolute 0.0 0.0 - 0.7 K/uL   Basophils Relative 0 %   Basophils Absolute 0.0 0.0 - 0.1 K/uL  Basic metabolic panel     Status: Abnormal   Collection Time: 01/03/17  4:23 AM  Result Value Ref Range   Sodium 136 135 - 145 mmol/L   Potassium 4.0 3.5 - 5.1 mmol/L   Chloride 100 (L) 101 - 111 mmol/L   CO2 26 22 - 32 mmol/L   Glucose, Bld 323 (H) 65 - 99 mg/dL   BUN 40 (H) 6 - 20 mg/dL   Creatinine, Ser 1.28 (H) 0.44 - 1.00 mg/dL   Calcium 9.1 8.9 - 10.3 mg/dL   GFR calc non Af Amer 40 (L) >60 mL/min   GFR calc Af Amer 47 (L) >60 mL/min    Comment: (NOTE) The eGFR has been calculated using the CKD EPI equation. This calculation has not been validated in all clinical situations. eGFR's persistently <60 mL/min signify possible Chronic Kidney Disease.    Anion gap 10 5 - 15  Glucose, capillary     Status: Abnormal   Collection Time: 01/03/17 11:23 AM  Result Value Ref Range   Glucose-Capillary 383 (H) 65 - 99 mg/dL  Glucose, capillary     Status: Abnormal  Collection Time: 01/03/17  6:05 PM  Result Value Ref Range   Glucose-Capillary 322 (H) 65 - 99 mg/dL   Comment 1 Notify RN   Glucose, capillary     Status: Abnormal   Collection Time: 01/03/17  9:46 PM  Result Value Ref Range   Glucose-Capillary 263 (H) 65 - 99 mg/dL    ABGS No results for input(s): PHART, PO2ART, TCO2, HCO3 in the last 72 hours.  Invalid input(s): PCO2 CULTURES Recent Results (from the past 240 hour(s))  Culture, Urine     Status: Abnormal   Collection Time: 12/30/16  3:38 PM  Result Value Ref Range Status   Specimen Description URINE, CLEAN CATCH  Final   Special Requests NONE  Final   Culture MULTIPLE SPECIES PRESENT, SUGGEST RECOLLECTION (A)  Final   Report Status 01/01/2017 FINAL  Final  Culture, blood (Routine X 2) w Reflex to ID Panel     Status: None (Preliminary result)   Collection Time: 12/30/16 10:40 PM   Result Value Ref Range Status   Specimen Description BLOOD LEFT ARM  Final   Special Requests Blood Culture adequate volume  Final   Culture NO GROWTH 4 DAYS  Final   Report Status PENDING  Incomplete  Culture, blood (Routine X 2) w Reflex to ID Panel     Status: None (Preliminary result)   Collection Time: 12/30/16 10:47 PM  Result Value Ref Range Status   Specimen Description LEFT ANTECUBITAL  Final   Special Requests Blood Culture adequate volume  Final   Culture NO GROWTH 4 DAYS  Final   Report Status PENDING  Incomplete  MRSA PCR Screening     Status: None   Collection Time: 12/31/16 10:08 AM  Result Value Ref Range Status   MRSA by PCR NEGATIVE NEGATIVE Final    Comment:        The GeneXpert MRSA Assay (FDA approved for NASAL specimens only), is one component of a comprehensive MRSA colonization surveillance program. It is not intended to diagnose MRSA infection nor to guide or monitor treatment for MRSA infections.    Studies/Results: No results found.  Medications:  Prior to Admission:  Prescriptions Prior to Admission  Medication Sig Dispense Refill Last Dose  . acetaminophen (TYLENOL) 500 MG tablet Take 500-1,000 mg by mouth as needed for mild pain.    unknown  . albuterol (PROVENTIL HFA;VENTOLIN HFA) 108 (90 Base) MCG/ACT inhaler Inhale 2 puffs into the lungs 4 (four) times daily.    unknown  . apixaban (ELIQUIS) 5 MG TABS tablet Take 1 tablet (5 mg total) by mouth 2 (two) times daily. 60 tablet 5 12/30/2016 at unknown  . baclofen (LIORESAL) 20 MG tablet Take 20 mg by mouth 2 (two) times daily.  1 12/30/2016 at Unknown time  . cetirizine (ZYRTEC) 10 MG tablet Take 10 mg by mouth daily.   unknown  . cyclobenzaprine (FLEXERIL) 10 MG tablet Take 10 mg by mouth 3 (three) times daily as needed for muscle spasms.   12/30/2016 at Unknown time  . diltiazem (CARDIZEM CD) 120 MG 24 hr capsule Take 1 capsule (120 mg total) by mouth daily. 90 capsule 3 12/30/2016 at Unknown time  .  fluticasone furoate-vilanterol (BREO ELLIPTA) 200-25 MCG/INH AEPB Inhale 1 puff into the lungs daily.   unknown  . gabapentin (NEURONTIN) 300 MG capsule Take 600 mg by mouth at bedtime. restless leg syndrome   12/30/2016 at Unknown time  . HYDROcodone-acetaminophen (NORCO/VICODIN) 5-325 MG tablet Take 1 tablet by  mouth every 4 (four) hours as needed for moderate pain or severe pain. 10 tablet 0 12/30/2016 at Unknown time  . ibuprofen (ADVIL,MOTRIN) 200 MG tablet Take 400 mg by mouth daily as needed for mild pain or moderate pain.   unknown  . levothyroxine (SYNTHROID, LEVOTHROID) 137 MCG tablet Take 137 mcg by mouth daily before breakfast.   12/30/2016 at Unknown time  . Magnesium 250 MG TABS Take 2 tablets by mouth 2 (two) times daily.    unknown  . methylPREDNISolone (MEDROL DOSEPAK) 4 MG TBPK tablet Take 1 tablet by mouth daily as needed. For gout  2 Past Week at Unknown time  . metoprolol tartrate (LOPRESSOR) 25 MG tablet TAKE 1/2 TABLET BY MOUTH TWICE DAILY 90 tablet 1 12/30/2016 at Unknown time  . omeprazole (PRILOSEC) 20 MG capsule TAKE ONE CAPSULE BY MOUTH DAILY 90 capsule 3 12/30/2016 at Unknown time  . potassium chloride SA (K-DUR,KLOR-CON) 20 MEQ tablet Take 1 tablet (20 mEq total) by mouth daily.   12/30/2016 at Unknown time  . torsemide (DEMADEX) 20 MG tablet Take 3 tablets (60 mg total) by mouth 2 (two) times daily. (may take an extra 27m as needed)   12/30/2016 at Unknown time  . triamcinolone (NASACORT ALLERGY 24HR) 55 MCG/ACT AERO nasal inhaler Place 2 sprays into the nose daily as needed (for allergies).    unknown   Scheduled: . ALPRAZolam  0.25 mg Oral BID  . apixaban  5 mg Oral BID  . aztreonam  1 g Intravenous Q8H  . chlorhexidine  15 mL Mouth Rinse BID  . clindamycin (CLEOCIN) IV  600 mg Intravenous Q8H  . diltiazem  120 mg Oral Daily  . feeding supplement (GLUCERNA SHAKE)  237 mL Oral TID BM  . fentaNYL (SUBLIMAZE) injection  25 mcg Intravenous Once  . fluticasone furoate-vilanterol   1 puff Inhalation Daily  . furosemide  40 mg Intravenous Q12H  . gabapentin  300 mg Oral QHS  . insulin aspart  0-20 Units Subcutaneous TID WC  . levothyroxine  137 mcg Oral QAC breakfast  . mouth rinse  15 mL Mouth Rinse q12n4p  . methylPREDNISolone (SOLU-MEDROL) injection  500 mg Intravenous Daily  . metoprolol tartrate  12.5 mg Oral BID  . pantoprazole (PROTONIX) IV  40 mg Intravenous Q24H  . potassium chloride  20 mEq Oral BID   Continuous: . lactated ringers 100 mL/hr at 01/03/17 1636   PESL:[id number]**OR** acetaminophen, albuterol, naLOXone (NARCAN)  injection, ondansetron **OR** ondansetron (ZOFRAN) IV, polyvinyl alcohol  Assesment: She was admitted with metabolic encephalopathy likely multifactorial. She was thought to have urinary tract infection but her urine grew multiple organisms. She is substantially better. There was concern that it might have been from medication. I think she may have had some agitation from withdrawal from medication as well. She is much better. Principal Problem:   Acute encephalopathy Active Problems:   Hypothyroidism   PAF (paroxysmal atrial fibrillation) (HCC)   Cellulitis of left lower extremity   Chronic anticoagulation   Acute lower UTI   Pain, dental   Spasm of back muscles   Hyperglycemia   Metabolic encephalopathy    Plan: Continue treatments. Reduce antibiotics. Cut Solu-Medrol dose.    LOS: 5 days   Karon Cotterill L 01/04/2017, 7:47 AM

## 2017-01-04 NOTE — Progress Notes (Signed)
PHARMACIST - PHYSICIAN COMMUNICATION  DR:   Juanetta Gosling  CONCERNING: IV to Oral Route Change Policy  RECOMMENDATION: This patient is receiving Protonix by the intravenous route.  Based on criteria approved by the Pharmacy and Therapeutics Committee, the intravenous medication(s) is/are being converted to the equivalent oral dose form(s).   DESCRIPTION: These criteria include:  The patient is eating (either orally or via tube) and/or has been taking other orally administered medications for a least 24 hours  The patient has no evidence of active gastrointestinal bleeding or impaired GI absorption (gastrectomy, short bowel, patient on TNA or NPO).  If you have questions about this conversion, please contact the Pharmacy Department    [phone] )  [facility]   [phone] )  [facility]   [phone] )  [facility]   [phone] )  [facility]   [phone] )  [facility]   Mady Gemma, [facility] 01/04/2017 2:44 PM

## 2017-01-04 NOTE — Clinical Social Work Placement (Addendum)
   CLINICAL SOCIAL WORK PLACEMENT  NOTE  Date:  01/04/2017  Patient Details  Name: Yolanda [name] MRN: [id number] Date of Birth: Jan 25, 1941  Clinical Social Work is seeking post-discharge placement for this patient at the   level of care (*CSW will initial, date and re-position this form in  chart as items are completed):  Yes   Patient/family provided with [facility] Clinical Social Work Department's list of facilities offering this level of care within the geographic area requested by the patient (or if unable, by the patient's family).  Yes   Patient/family informed of their freedom to choose among providers that offer the needed level of care, that participate in Medicare, Medicaid or managed care program needed by the patient, have an available bed and are willing to accept the patient.  Yes   Patient/family informed of [facility]'s ownership interest in [facility] and [facility], as well as of the fact that they are under no obligation to receive care at these facilities.  PASRR submitted to EDS on       PASRR number received on       Existing PASRR number confirmed on 01/04/17     FL2 transmitted to all facilities in geographic area requested by pt/family on 01/04/17     FL2 transmitted to all facilities within larger geographic area on       Patient informed that his/her managed care company has contracts with or will negotiate with certain facilities, including the following:            Patient/family informed of bed offers received..   01/07/2017   Patient chooses bed at    [facility]   Physician recommends and patient chooses bed at     [facility] Patient to be transferred to   [facility] on  .  01/07/2017   Patient to be transferred to facility by     [facility]  Patient family notified on   of transfer. 01/07/2017 Patient informed family via phone  Name of family member notified:        PHYSICIAN       Additional Comment:     _______________________________________________ Nelwyn Salisbury, LCSW 01/04/2017, 3:22 PM

## 2017-01-04 NOTE — Progress Notes (Signed)
[facility] 418 Dr.Hawkins notified and the following new orders given:  1.) Novolog insulin 20 units SQ x 1 time only 2.) Lantus 10 units SQ now & then QD  Patient aware

## 2017-01-04 NOTE — Progress Notes (Signed)
Physical Therapy Treatment Patient Details Name: Yolanda [name] MRN: [id number] DOB: Mar 31, 1941 Today's Date: 01/04/2017    History of Present Illness 76 y.o. female with medical history significant of Afib on Eliquis, Stage 3 CKD, hypothyroidism, GERD, HTN, diastolic heart failure, and COPD presenting with AMS.  The patient reports that she first noticed a jagged edge on her tooth on Thursday night.  However, her sister reports that she has actually been seeing a dentist about this issue and has had a procedure for it but it has continued to bother her.  Her sister then reports that actually on Thursday night, she was having problems with her back spasms and came to the ER here; she had previously received Flexeril and Baclofen from Dr. Juanetta Gosling for this issue and sent home with those and Vicodin.  No medication since yesterday but still having spasms.  Overnight, she got very "loopy", confused, usually very lucid.  Sometimes comes and goes but it has intensified since it started.  Repeating herself and "it's backtracking and it's just messed up."  Still having back spasms.  No urinary symptoms per the patient.  "They took care of my pee problem."  However, her sister reports that she did wet the bed last night and wouldn't wear underwear today.  Dx:  Multifactorial toxic metabolic encephalopathy: Etiologies includes acute urinary tract infection, acute cellulitis and medication effect.    PT Comments    Pt received in bed, sister present, and pt is agreeable to PT tx.  Pt demonstrates improved cognition today with no echolalia, or perseveration.  She also demonstrates less edema in B UE's.  She was able to follow commands more swiftly, and no longer demonstrates apraxia with functional tasks.  She continues to require +2 person assistance for supine<>sit with tactile cues for rolling and weight shifting.  She required Max A +2 for sit<>stand with STEDY, and tactile cues to maintain hip extension  throughout transfer bed<>chair.  Once in the chair she was able to perform chair push ups with good lift of buttocks with Min A +2 person.  Continue to recommend CIR primarily due to pt's prior level of function being independent, and she has already made some excellent gains.  She stated that her sisters would be able to provide 24/7 supervision/assistance for her when she is ready to d/c home.    Follow Up Recommendations  CIR;SNF     Equipment Recommendations  None recommended by PT    Recommendations for Other Services       Precautions / Restrictions Precautions Precautions: Fall Precaution Comments: 3 falls - at church, down the steps, and then just prior to admission - fell off a stool in the bathroom.  Restrictions Weight Bearing Restrictions: No    Mobility  Bed Mobility Overal bed mobility: Needs Assistance Bed Mobility: Supine to Sit     Supine to sit: Min assist;HOB elevated;+2 for physical assistance        Transfers Overall transfer level: Needs assistance   Transfers: Sit to/from Stand Sit to Stand: Max assist;+2 physical assistance         General transfer comment: Pt requires strong tactile cues for hip extension, as well as head up chest up for tall upright standing posture.  Pt does not demonstrate much initiation with UE's during this transfer.  Ambulation/Gait                 Stairs            Wheelchair  Mobility    Modified Rankin (Stroke Patients Only)       Balance Overall balance assessment: History of Falls;Needs assistance Sitting-balance support: Feet supported;Bilateral upper extremity supported Sitting balance-Leahy Scale: Fair Sitting balance - Comments: Pt requires supervision   Standing balance support: Bilateral upper extremity supported Standing balance-Leahy Scale: Zero                              Cognition Arousal/Alertness: Awake/alert Behavior During Therapy: WFL for tasks  assessed/performed Overall Cognitive Status: Within Functional Limits for tasks assessed                                 General Comments: no perseveration or echolalia noted today.  Pt is much more clear with her thought process, and does not demonstrate apraxia today.       Exercises General Exercises - Upper Extremity Chair Push Up: Strengthening;Both;5 reps;Seated (From recliner with Min A +2 person.  VC's to lean fwd, and push up. )    General Comments        Pertinent Vitals/Pain Pain Assessment: Faces Faces Pain Scale: Hurts little more Pain Location: Continues to be sensitive to light touch especially L LE.  Pain Intervention(s): Limited activity within patient's tolerance;Monitored during session;Repositioned    Home Living                      Prior Function            PT Goals (current goals can now be found in the care plan section) Acute Rehab PT Goals Patient Stated Goal: To get stronger PT Goal Formulation: With patient/family Time For Goal Achievement: 01/17/17 Potential to Achieve Goals: Good Progress towards PT goals: Progressing toward goals    Frequency    Min 6X/week      PT Plan Current plan remains appropriate    Co-evaluation PT/OT/SLP Co-Evaluation/Treatment: Yes Reason for Co-Treatment: Necessary to address cognition/behavior during functional activity;Complexity of the patient's impairments (multi-system involvement);For patient/therapist safety;To address functional/ADL transfers PT goals addressed during session: Strengthening/ROM;Mobility/safety with mobility;Balance       End of Session Equipment Utilized During Treatment: Oxygen;Gait belt (STEDY) Activity Tolerance: Patient limited by pain;Patient limited by fatigue Patient left: in chair;with call bell/phone within reach;with family/visitor present Nurse Communication: Mobility status;Need for lift equipment (STEDY) PT Visit Diagnosis: Other abnormalities  of gait and mobility (R26.89);Muscle weakness (generalized) (M62.81);History of falling (Z91.81);Apraxia (R48.2);Other symptoms and signs involving the nervous system (R29.898)     Time: 0830-0900 PT Time Calculation (min) (ACUTE ONLY): 30 min  Charges:  $Therapeutic Activity: 8-22 mins                    G Codes:       Beth Cayce Paschal, PT, DPT X: [phone]

## 2017-01-04 NOTE — Progress Notes (Signed)
Patient transferred from chair to bed via stedy lift and 2 person assist. Improvement in strength noted but patient still unable to stand from a sitting position by herself w/o c/o bilateral arm and leg pain.

## 2017-01-04 NOTE — Care Management Note (Signed)
Case Management Note  Patient Details  Name: Yolanda [name] MRN: [id number] Date of Birth: 11/11/1940     Expected Discharge Date:      01/06/2017          Expected Discharge Plan:  [place]  In-House Referral:     Discharge planning Services  CM Consult  Post Acute Care Choice:  NA Choice offered to:  NA  DME Arranged:    DME Agency:     HH Arranged:    [place] Agency:     Status of Service:  In process, will continue to follow  If discussed at Long Length of Stay Meetings, dates discussed:    Additional Comments: CM met with patient and sister Yolanda [name]. Patient nor family want to pursue CIR. They would like SNF placement locally and prefer [facility] if available. CSW aware. No CM needs at this time.   Durenda Pechacek, Chauncey Reading, RN 01/04/2017, 2:16 PM

## 2017-01-04 NOTE — Evaluation (Signed)
Occupational Therapy Evaluation Patient Details Name: Yolanda [name] MRN: [id number] DOB: [birth year] Today's Date: 01/04/2017    History of Present Illness 76 y.o. female with medical history significant of Afib on Eliquis, Stage 3 CKD, hypothyroidism, GERD, HTN, diastolic heart failure, and COPD presenting with AMS.  The patient reports that she first noticed a jagged edge on her tooth on Thursday night.  However, her sister reports that she has actually been seeing a dentist about this issue and has had a procedure for it but it has continued to bother her.  Her sister then reports that actually on Thursday night, she was having problems with her back spasms and came to the ER here; she had previously received Flexeril and Baclofen from Dr. Juanetta Gosling for this issue and sent home with those and Vicodin.  No medication since yesterday but still having spasms.  Overnight, she got very "loopy", confused, usually very lucid.  Sometimes comes and goes but it has intensified since it started.  Repeating herself and "it's backtracking and it's just messed up."  Still having back spasms.  No urinary symptoms per the patient.  "They took care of my pee problem."  However, her sister reports that she did wet the bed last night and wouldn't wear underwear today.  Dx:  Multifactorial toxic metabolic encephalopathy: Etiologies includes acute urinary tract infection, acute cellulitis and medication effect.   Clinical Impression   Patient agreeable to participate in OT evaluation. Co-tx completed with PT. Patient presents with decreased BUE strength, ROM, fine and gross motor coordination. Patient has edema in BUEs and increased sensitivity to touch. Education provided for desensitization techniques. Patient reports that wherever she ends up going for rehab, she will have family members that will come stay with her and be able to provide assistance. Patient would benefit from CIR at discharge due to be independent prior  to hospitalization, her motivation to return to [facility], and a strong family support system. If she does not get excepted to CIR she will require discharge to SNF.   Follow Up Recommendations  CIR;SNF    Equipment Recommendations  None recommended by OT    Recommendations for Other Services Rehab consult     Precautions / Restrictions Precautions Precautions: Fall Precaution Comments: 3 falls - at church, down the steps, and then just prior to admission - fell off a stool in the bathroom.  Restrictions Weight Bearing Restrictions: No      Mobility Bed Mobility Overal bed mobility: Needs Assistance Bed Mobility: Supine to Sit     Supine to sit: Min assist;HOB elevated;+2 for physical assistance     General bed mobility comments: Increased time with assistance to progress each extremity.  Once sitting on the EOB pt holds UE's in guarded position, and despite verbal and tactile cues to use UE's to push herself forward she is not able to, and bed pad used to assist pt's hips to the EOB.    Transfers Overall transfer level: Needs assistance   Transfers: Sit to/from Stand Sit to Stand: Max assist;+2 physical assistance         General transfer comment: Pt requires strong tactile cues for hip extension, as well as head up chest up for tall upright standing posture.  Pt does not demonstrate much initiation with UE's during this transfer.    Balance Overall balance assessment: History of Falls;Needs assistance Sitting-balance support: Feet supported;Bilateral upper extremity supported Sitting balance-Leahy Scale: Fair Sitting balance - Comments: Pt requires supervision   Standing  balance support: Bilateral upper extremity supported Standing balance-Leahy Scale: Zero                             ADL either performed or assessed with clinical judgement   ADL Overall ADL's : Needs assistance/impaired                     Lower Body Dressing: Total  assistance;Bed level Lower Body Dressing Details (indicate cue type and reason): donning hospital socks.               General ADL Comments: Patient requested to clean her nose. patient was able to use Left hand and flex elbow as she flexed her neck and brought tissue to nose. Increased difficulty with coordinatino of task.      Vision Baseline Vision/History: Wears glasses Wears Glasses: At all times Patient Visual Report: No change from baseline              Pertinent Vitals/Pain Pain Assessment: Faces Faces Pain Scale: Hurts little more Pain Location: Continues to be sensitive to light touch especially L LE.  Pain Intervention(s): Limited activity within patient's tolerance;Monitored during session     Hand Dominance Right   Extremity/Trunk Assessment Upper Extremity Assessment Upper Extremity Assessment: Generalized weakness;LUE deficits/detail;RUE deficits/detail RUE Deficits / Details: Patient able to complete shoulder flexion through 35-40% range, elbow flexion/extension to 75% range, wrist flexion 75% range. Unable to form a complete fist due to edema as patient presents with a weak gross grasp.  RUE Sensation:  (Increased sensitivity to touch) RUE Coordination: decreased fine motor;decreased gross motor LUE Deficits / Details: Patient able to complete shoulder flexion through 15% range, elbow flexion/extension to 75% range, wrist flexion to 75% range. Unable to form a complete fist due to edema as patient presents with a weak gross grasp.  LUE Sensation:  (Increased sensitivity to touch) LUE Coordination: decreased fine motor;decreased gross motor           Communication Communication Communication: No difficulties   Cognition Arousal/Alertness: Awake/alert Behavior During Therapy: WFL for tasks assessed/performed Overall Cognitive Status: Within Functional Limits for tasks assessed                                 General Comments: Based on  previous session with phyiscal therapist: no perseveration or echolalia noted today.  Pt is much more clear with her thought process, and does not demonstrate apraxia today.    General Comments       Exercises General Exercises - Upper Extremity Chair Push Up: Strengthening;Both;5 reps;Seated (From recliner with Min A +2 person.  VC's to lean fwd, and push up. ) Other Exercises Other Exercises: Educated patient on ways to decrease sensitivity including completing 2 minutes each of applying lotion to BUEs, tapping, and rubbing washcloth. Pt verbalized understanding.    Shoulder Instructions      Home Living Family/patient expects to be discharged to:: Inpatient rehab Living Arrangements: Alone   Type of Home: House Home Access: Stairs to enter Entrance Stairs-Number of Steps: 4-5 Entrance Stairs-Rails: None Home Layout: One level     Bathroom Shower/Tub: Walk-in shower;Tub/shower unit   Bathroom Toilet: Standard     Home Equipment: Cane - single point          Prior Functioning/Environment Level of Independence: Independent with assistive device(s)  Gait / Transfers Assistance Needed:  Pt states she occasionally uses the cane for ambulation  ADL's / Homemaking Assistance Needed: independent   Comments: Driving - Pt is normally able to ambulate community distances.          OT Problem List: Decreased strength;Decreased range of motion;Decreased coordination;Pain;Increased edema;Impaired sensation;Decreased activity tolerance;Decreased safety awareness;Impaired balance (sitting and/or standing);Decreased knowledge of use of DME or AE;Impaired UE functional use      OT Treatment/Interventions: Self-care/ADL training;Therapeutic exercise;Modalities;Balance training;Therapeutic activities;Energy conservation;DME and/or AE instruction;Manual therapy;Patient/family education    OT Goals(Current goals can be found in the care plan section) Acute Rehab OT Goals Patient Stated  Goal: To get stronger OT Goal Formulation: With patient Time For Goal Achievement: 01/18/17 Potential to Achieve Goals: Good  OT Frequency: Min 2X/week           Co-evaluation PT/OT/SLP Co-Evaluation/Treatment: Yes Reason for Co-Treatment: Complexity of the patient's impairments (multi-system involvement);Necessary to address cognition/behavior during functional activity;For patient/therapist safety;To address functional/ADL transfers PT goals addressed during session: Strengthening/ROM;Mobility/safety with mobility;Balance OT goals addressed during session: ADL's and self-care;Strengthening/ROM      End of Session Equipment Utilized During Treatment: Gait belt  Activity Tolerance: Patient tolerated treatment well Patient left: in chair;with call bell/phone within reach;with family/visitor present  OT Visit Diagnosis: Muscle weakness (generalized) (M62.81)                Time: 0830-0900 OT Time Calculation (min): 30 min Charges:  OT General Charges $OT Visit: 1 Procedure OT Evaluation $OT Eval Low Complexity: 1 Procedure G-Codes: OT G-codes **NOT FOR INPATIENT CLASS** Functional Assessment Tool Used: AM-PAC 6 Clicks Daily Activity Functional Limitation: Self care Self Care Current Status (Z6109): At least 80 percent but less than 100 percent impaired, limited or restricted Self Care Goal Status (U0454): At least 60 percent but less than 80 percent impaired, limited or restricted   Limmie Patricia, OTR/L,CBIS  [phone]   Maura Braaten, Charisse March 01/04/2017, 10:54 AM

## 2017-01-05 LAB — GLUCOSE, CAPILLARY
GLUCOSE-CAPILLARY: 353 mg/dL — AB (ref 65–99)
GLUCOSE-CAPILLARY: 305 mg/dL — AB (ref 65–99)
Glucose-Capillary: 414 mg/dL — ABNORMAL HIGH (ref 65–99)
Glucose-Capillary: 361 mg/dL — ABNORMAL HIGH (ref 65–99)
Glucose-Capillary: 400 mg/dL — ABNORMAL HIGH (ref 65–99)

## 2017-01-05 NOTE — Progress Notes (Signed)
Physical Therapy Treatment Patient Details Name: Yolanda [name] MRN: [id number] DOB: September 26, 1940 Today's Date: 01/05/2017    History of Present Illness 76 y.o. female with medical history significant of Afib on Eliquis, Stage 3 CKD, hypothyroidism, GERD, HTN, diastolic heart failure, and COPD presenting with AMS.  The patient reports that she first noticed a jagged edge on her tooth on Thursday night.  However, her sister reports that she has actually been seeing a dentist about this issue and has had a procedure for it but it has continued to bother her.  Her sister then reports that actually on Thursday night, she was having problems with her back spasms and came to the ER here; she had previously received Flexeril and Baclofen from Dr. Juanetta Gosling for this issue and sent home with those and Vicodin.  No medication since yesterday but still having spasms.  Overnight, she got very "loopy", confused, usually very lucid.  Sometimes comes and goes but it has intensified since it started.  Repeating herself and "it's backtracking and it's just messed up."  Still having back spasms.  No urinary symptoms per the patient.  "They took care of my pee problem."  However, her sister reports that she did wet the bed last night and wouldn't wear underwear today.  Dx:  Multifactorial toxic metabolic encephalopathy: Etiologies includes acute urinary tract infection, acute cellulitis and medication effect.    PT Comments    Pt received in bed, and was agreeable to PT tx.  She demonstrates improvement with bed mobility, however, continues to c/o pain over her entire body, which is mostly in her L LE and R elbow.  She is very fearful of falling, and requires constant reassurance today.  She required Max A for sit<>stand with RW, but has difficulty with anterior weight shift needed to ease this transition.  She was able to take 2-3 steps to transfer bed<>chair, but required Max A including assistance for weight shifting to be  able to perform this task.  Continue to recommend further rehabilitation at d/c, and family has decided on SNF at this point.     Follow Up Recommendations  SNF     Equipment Recommendations  None recommended by PT    Recommendations for Other Services       Precautions / Restrictions Precautions Precautions: Fall Precaution Comments: 3 falls - at church, down the steps, and then just prior to admission - fell off a stool in the bathroom.  Restrictions Weight Bearing Restrictions: No    Mobility  Bed Mobility Overal bed mobility: Needs Assistance Bed Mobility: Supine to Sit     Supine to sit: HOB elevated;Min assist     General bed mobility comments: Assist to roll upper body and be able to use bed rail to assist with pushing trunk up.    Transfers Overall transfer level: Needs assistance Equipment used: Rolling walker (2 wheeled) Transfers: Sit to/from [employer] Sit to Stand: Max assist Stand pivot transfers: Mod assist       General transfer comment: Pt is very fearful of falling.  Assistance to show her how far forward she needs to shift her weight to be able to stand.  Pt was abel to perform sit<>stand x 2 trials while using momentum to be able to stand on the count of 3.  On 2nd trial she required assistance for weight shifting and was able to take 2-3 steps to be able to transfer into the chair.  Poor eccentric control down into  the chair.    Ambulation/Gait                 Stairs            Wheelchair Mobility    Modified Rankin (Stroke Patients Only)       Balance Overall balance assessment: History of Falls;Needs assistance Sitting-balance support: Feet supported;Bilateral upper extremity supported Sitting balance-Leahy Scale: Fair Sitting balance - Comments: Pt requires supervision   Standing balance support: Bilateral upper extremity supported Standing balance-Leahy Scale: Poor Standing balance comment: poor hip  extension with posture remaining in forward flexed position.                             Cognition Arousal/Alertness: Awake/alert Behavior During Therapy: WFL for tasks assessed/performed Overall Cognitive Status: Within Functional Limits for tasks assessed                                        Exercises      General Comments        Pertinent Vitals/Pain Faces Pain Scale: Hurts even more Pain Location: Continues to be sensitive to light touch especially L LE.  Pain Intervention(s): Limited activity within patient's tolerance;Monitored during session;Repositioned    Home Living                      Prior Function            PT Goals (current goals can now be found in the care plan section) Acute Rehab PT Goals Patient Stated Goal: To get stronger PT Goal Formulation: With patient/family Time For Goal Achievement: 01/17/17 Potential to Achieve Goals: Good Progress towards PT goals: Progressing toward goals    Frequency    Min 6X/week      PT Plan Current plan remains appropriate    Co-evaluation             End of Session Equipment Utilized During Treatment: Gait belt;Oxygen Activity Tolerance: Patient tolerated treatment well Patient left: in chair;with call bell/phone within reach;with nursing/sitter in room Nurse Communication: Mobility status;Need for lift equipment (RN informed to use STEDY for transfer BTB) PT Visit Diagnosis: Other abnormalities of gait and mobility (R26.89);Muscle weakness (generalized) (M62.81);History of falling (Z91.81);Apraxia (R48.2);Other symptoms and signs involving the nervous system (R29.898)     Time: 1610-9604 PT Time Calculation (min) (ACUTE ONLY): 32 min  Charges:  $Therapeutic Activity: 23-37 mins                    G Codes:       Beth Vannary Greening, PT, DPT X: [phone]

## 2017-01-05 NOTE — Progress Notes (Signed)
Pt arrived to A326 via bed. Pt alert and oreinted in NAD. O2 via 2LNC. Assessment completed as noted. POC discussed. Pt v/u and agreement. Pt oriented to room unit. PT in to work with pt. Will monitor and continue with POC.

## 2017-01-05 NOTE — Progress Notes (Signed)
Subjective: She says she feels much better. She is much more alert. She is still very weak. She is still requiring significant assistance. No other new problems noted.  Objective: Vital signs in last 24 hours: Temp:  [97.6 F (36.4 C)-98.9 F (37.2 C)] 98.2 F (36.8 C) (04/14 0722) Pulse Rate:  [65-86] 77 (04/14 0722) Resp:  [12-25] 25 (04/14 0722) BP: (126-144)/(60-123) 132/69 (04/14 0400) SpO2:  [93 %-99 %] 97 % (04/14 0746) Weight:  [103.2 kg (227 lb 8.2 oz)] 103.2 kg (227 lb 8.2 oz) (04/14 0509) Weight change:  Last BM Date: 01/02/17  Intake/Output from previous day: 04/13 0701 - 04/14 0700 In: 100 [IV Piggyback:100] Out: 2900 [Urine:2900]  PHYSICAL EXAM General appearance: alert, cooperative and no distress Resp: clear to auscultation bilaterally Cardio: regular rate and rhythm, S1, S2 normal, no murmur, click, rub or gallop GI: soft, non-tender; bowel sounds normal; no masses,  no organomegaly Extremities: extremities normal, atraumatic, no cyanosis or edema Skin warm and dry. The area of cellulitis is much improved  Lab Results:  Results for orders placed or performed during the hospital encounter of 12/30/16 (from the past 48 hour(s))  Glucose, capillary     Status: Abnormal   Collection Time: 01/03/17 11:23 AM  Result Value Ref Range   Glucose-Capillary 383 (H) 65 - 99 mg/dL  Glucose, capillary     Status: Abnormal   Collection Time: 01/03/17  6:05 PM  Result Value Ref Range   Glucose-Capillary 322 (H) 65 - 99 mg/dL   Comment 1 Notify RN   Glucose, capillary     Status: Abnormal   Collection Time: 01/03/17  9:46 PM  Result Value Ref Range   Glucose-Capillary 263 (H) 65 - 99 mg/dL  Glucose, capillary     Status: Abnormal   Collection Time: 01/04/17  8:01 AM  Result Value Ref Range   Glucose-Capillary 271 (H) 65 - 99 mg/dL  Glucose, capillary     Status: Abnormal   Collection Time: 01/04/17 11:48 AM  Result Value Ref Range   Glucose-Capillary 418 (H) 65 -  99 mg/dL  Glucose, capillary     Status: Abnormal   Collection Time: 01/04/17  5:45 PM  Result Value Ref Range   Glucose-Capillary 402 (H) 65 - 99 mg/dL  Glucose, capillary     Status: Abnormal   Collection Time: 01/05/17  7:21 AM  Result Value Ref Range   Glucose-Capillary 361 (H) 65 - 99 mg/dL    ABGS No results for input(s): PHART, PO2ART, TCO2, HCO3 in the last 72 hours.  Invalid input(s): PCO2 CULTURES Recent Results (from the past 240 hour(s))  Culture, Urine     Status: Abnormal   Collection Time: 12/30/16  3:38 PM  Result Value Ref Range Status   Specimen Description URINE, CLEAN CATCH  Final   Special Requests NONE  Final   Culture MULTIPLE SPECIES PRESENT, SUGGEST RECOLLECTION (A)  Final   Report Status 01/01/2017 FINAL  Final  Culture, blood (Routine X 2) w Reflex to ID Panel     Status: None   Collection Time: 12/30/16 10:40 PM  Result Value Ref Range Status   Specimen Description BLOOD LEFT ARM  Final   Special Requests Blood Culture adequate volume  Final   Culture NO GROWTH 5 DAYS  Final   Report Status 01/04/2017 FINAL  Final  Culture, blood (Routine X 2) w Reflex to ID Panel     Status: None   Collection Time: 12/30/16 10:47 PM  Result  Value Ref Range Status   Specimen Description LEFT ANTECUBITAL  Final   Special Requests Blood Culture adequate volume  Final   Culture NO GROWTH 5 DAYS  Final   Report Status 01/04/2017 FINAL  Final  MRSA PCR Screening     Status: None   Collection Time: 12/31/16 10:08 AM  Result Value Ref Range Status   MRSA by PCR NEGATIVE NEGATIVE Final    Comment:        The GeneXpert MRSA Assay (FDA approved for NASAL specimens only), is one component of a comprehensive MRSA colonization surveillance program. It is not intended to diagnose MRSA infection nor to guide or monitor treatment for MRSA infections.    Studies/Results: No results found.  Medications:  Prior to Admission:  Prescriptions Prior to Admission   Medication Sig Dispense Refill Last Dose  . acetaminophen (TYLENOL) 500 MG tablet Take 500-1,000 mg by mouth as needed for mild pain.    unknown  . albuterol (PROVENTIL HFA;VENTOLIN HFA) 108 (90 Base) MCG/ACT inhaler Inhale 2 puffs into the lungs 4 (four) times daily.    unknown  . apixaban (ELIQUIS) 5 MG TABS tablet Take 1 tablet (5 mg total) by mouth 2 (two) times daily. 60 tablet 5 12/30/2016 at unknown  . baclofen (LIORESAL) 20 MG tablet Take 20 mg by mouth 2 (two) times daily.  1 12/30/2016 at Unknown time  . cetirizine (ZYRTEC) 10 MG tablet Take 10 mg by mouth daily.   unknown  . cyclobenzaprine (FLEXERIL) 10 MG tablet Take 10 mg by mouth 3 (three) times daily as needed for muscle spasms.   12/30/2016 at Unknown time  . diltiazem (CARDIZEM CD) 120 MG 24 hr capsule Take 1 capsule (120 mg total) by mouth daily. 90 capsule 3 12/30/2016 at Unknown time  . fluticasone furoate-vilanterol (BREO ELLIPTA) 200-25 MCG/INH AEPB Inhale 1 puff into the lungs daily.   unknown  . gabapentin (NEURONTIN) 300 MG capsule Take 600 mg by mouth at bedtime. restless leg syndrome   12/30/2016 at Unknown time  . HYDROcodone-acetaminophen (NORCO/VICODIN) 5-325 MG tablet Take 1 tablet by mouth every 4 (four) hours as needed for moderate pain or severe pain. 10 tablet 0 12/30/2016 at Unknown time  . ibuprofen (ADVIL,MOTRIN) 200 MG tablet Take 400 mg by mouth daily as needed for mild pain or moderate pain.   unknown  . levothyroxine (SYNTHROID, LEVOTHROID) 137 MCG tablet Take 137 mcg by mouth daily before breakfast.   12/30/2016 at Unknown time  . Magnesium 250 MG TABS Take 2 tablets by mouth 2 (two) times daily.    unknown  . methylPREDNISolone (MEDROL DOSEPAK) 4 MG TBPK tablet Take 1 tablet by mouth daily as needed. For gout  2 Past Week at Unknown time  . metoprolol tartrate (LOPRESSOR) 25 MG tablet TAKE 1/2 TABLET BY MOUTH TWICE DAILY 90 tablet 1 12/30/2016 at Unknown time  . omeprazole (PRILOSEC) 20 MG capsule TAKE ONE CAPSULE BY  MOUTH DAILY 90 capsule 3 12/30/2016 at Unknown time  . potassium chloride SA (K-DUR,KLOR-CON) 20 MEQ tablet Take 1 tablet (20 mEq total) by mouth daily.   12/30/2016 at Unknown time  . torsemide (DEMADEX) 20 MG tablet Take 3 tablets (60 mg total) by mouth 2 (two) times daily. (may take an extra  as needed)   12/30/2016 at Unknown time  . triamcinolone (NASACORT ALLERGY 24HR) 55 MCG/ACT AERO nasal inhaler Place 2 sprays into the nose daily as needed (for allergies).    unknown   Scheduled: .  ALPRAZolam  0.25 mg Oral BID  . apixaban  5 mg Oral BID  . chlorhexidine  15 mL Mouth Rinse BID  . clindamycin (CLEOCIN) IV  600 mg Intravenous Q8H  . diltiazem  120 mg Oral Daily  . feeding supplement (GLUCERNA SHAKE)  237 mL Oral TID BM  . fentaNYL (SUBLIMAZE) injection  25 mcg Intravenous Once  . fluticasone furoate-vilanterol  1 puff Inhalation Daily  . furosemide  40 mg Intravenous Q12H  . gabapentin  300 mg Oral QHS  . insulin aspart  0-20 Units Subcutaneous TID WC  . insulin glargine  10 Units Subcutaneous Daily  . levothyroxine  137 mcg Oral QAC breakfast  . mouth rinse  15 mL Mouth Rinse q12n4p  . methylPREDNISolone (SOLU-MEDROL) injection  40 mg Intravenous Q12H  . metoprolol tartrate  12.5 mg Oral BID  . pantoprazole  40 mg Oral Daily  . potassium chloride  20 mEq Oral BID   Continuous:  ZOX:[id number] **OR** acetaminophen, albuterol, naLOXone (NARCAN)  injection, ondansetron **OR** ondansetron (ZOFRAN) IV, polyvinyl alcohol  Assesment: She was admitted with acute encephalopathy. It is not totally clear what this is from. She did have infection with cellulitis of her left lower extremity and she thought to have a UTI but grew multiple organisms. She has gradually improved. She is back at baseline as far as her mental status is concerned but she is still very weak. Principal Problem:   Acute encephalopathy Active Problems:   Hypothyroidism   PAF (paroxysmal atrial fibrillation) (HCC)    Cellulitis of left lower extremity   Chronic anticoagulation   Acute lower UTI   Pain, dental   Spasm of back muscles   Hyperglycemia   Metabolic encephalopathy    Plan: Plan is for her to be transferred to skilled care facility eventually and I think she can move out of stepdown today.    LOS: 6 days   Numa Schroeter L 01/05/2017, 9:56 AM

## 2017-01-06 LAB — GLUCOSE, CAPILLARY
GLUCOSE-CAPILLARY: 389 mg/dL — AB (ref 65–99)
GLUCOSE-CAPILLARY: 400 mg/dL — AB (ref 65–99)
GLUCOSE-CAPILLARY: 300 mg/dL — AB (ref 65–99)
Glucose-Capillary: 259 mg/dL — ABNORMAL HIGH (ref 65–99)

## 2017-01-06 MED ORDER — PREDNISONE 20 MG PO TABS
40.0000 mg | ORAL_TABLET | Freq: Every day | ORAL | Status: DC
  Administered 2017-01-06 – 2017-01-07 (×2): 40 mg via ORAL
  Filled 2017-01-06 (×2): qty 2

## 2017-01-06 MED ORDER — INSULIN GLARGINE 100 UNIT/ML ~~LOC~~ SOLN
20.0000 [IU] | Freq: Every day | SUBCUTANEOUS | Status: DC
Start: 2017-01-06 — End: 2017-01-07
  Administered 2017-01-06 – 2017-01-07 (×2): 20 [IU] via SUBCUTANEOUS
  Filled 2017-01-06 (×3): qty 0.2

## 2017-01-06 MED ORDER — GABAPENTIN 300 MG PO CAPS
300.0000 mg | ORAL_CAPSULE | Freq: Two times a day (BID) | ORAL | Status: DC
  Administered 2017-01-06 – 2017-01-07 (×3): 300 mg via ORAL
  Filled 2017-01-06 (×3): qty 1

## 2017-01-06 MED ORDER — INSULIN ASPART 100 UNIT/ML ~~LOC~~ SOLN
10.0000 [IU] | Freq: Once | SUBCUTANEOUS | Status: AC
  Administered 2017-01-06: 10 [IU] via SUBCUTANEOUS

## 2017-01-06 NOTE — Progress Notes (Addendum)
Subjective: She says she feels better. She has no new complaints. She is still very weak. She has some aching in her legs. Her blood sugar has been up but she is on steroids.  Objective: Vital signs in last 24 hours: Temp:  [97.7 F (36.5 C)-97.9 F (36.6 C)] 97.9 F (36.6 C) (04/15 0727) Pulse Rate:  [90] 90 (04/14 2134) Resp:  [18] 18 (04/14 2134) BP: (145-147)/(74-87) 145/87 (04/15 0727) SpO2:  [94 %-97 %] 95 % (04/15 0818) Weight change:  Last BM Date: 01/03/17  Intake/Output from previous day: 04/14 0701 - 04/15 0700 In: 820 [P.O.:720; IV Piggyback:100] Out: 3250 [Urine:3250]  PHYSICAL EXAM General appearance: alert, cooperative and mild distress Resp: clear to auscultation bilaterally Cardio: regular rate and rhythm, S1, S2 normal, no murmur, click, rub or gallop GI: soft, non-tender; bowel sounds normal; no masses,  no organomegaly Extremities: edema Mostly of her feet bilaterally Pupils react. EOMI.  Lab Results:  Results for orders placed or performed during the hospital encounter of 12/30/16 (from the past 48 hour(s))  Glucose, capillary     Status: Abnormal   Collection Time: 01/04/17 11:48 AM  Result Value Ref Range   Glucose-Capillary 418 (H) 65 - 99 mg/dL  Glucose, capillary     Status: Abnormal   Collection Time: 01/04/17  5:45 PM  Result Value Ref Range   Glucose-Capillary 402 (H) 65 - 99 mg/dL  Glucose, capillary     Status: Abnormal   Collection Time: 01/04/17 10:11 PM  Result Value Ref Range   Glucose-Capillary 414 (H) 65 - 99 mg/dL   Comment 1 Notify RN    Comment 2 Document in Chart   Glucose, capillary     Status: Abnormal   Collection Time: 01/05/17  7:21 AM  Result Value Ref Range   Glucose-Capillary 361 (H) 65 - 99 mg/dL  Glucose, capillary     Status: Abnormal   Collection Time: 01/05/17 12:28 PM  Result Value Ref Range   Glucose-Capillary 400 (H) 65 - 99 mg/dL  Glucose, capillary     Status: Abnormal   Collection Time: 01/05/17  4:55  PM  Result Value Ref Range   Glucose-Capillary 353 (H) 65 - 99 mg/dL  Glucose, capillary     Status: Abnormal   Collection Time: 01/05/17  9:21 PM  Result Value Ref Range   Glucose-Capillary 305 (H) 65 - 99 mg/dL   Comment 1 Notify RN    Comment 2 Document in Chart   Glucose, capillary     Status: Abnormal   Collection Time: 01/06/17  7:59 AM  Result Value Ref Range   Glucose-Capillary 259 (H) 65 - 99 mg/dL   Comment 1 Notify RN    Comment 2 Document in Chart     ABGS No results for input(s): PHART, PO2ART, TCO2, HCO3 in the last 72 hours.  Invalid input(s): PCO2 CULTURES Recent Results (from the past 240 hour(s))  Culture, Urine     Status: Abnormal   Collection Time: 12/30/16  3:38 PM  Result Value Ref Range Status   Specimen Description URINE, CLEAN CATCH  Final   Special Requests NONE  Final   Culture MULTIPLE SPECIES PRESENT, SUGGEST RECOLLECTION (A)  Final   Report Status 01/01/2017 FINAL  Final  Culture, blood (Routine X 2) w Reflex to ID Panel     Status: None   Collection Time: 12/30/16 10:40 PM  Result Value Ref Range Status   Specimen Description BLOOD LEFT ARM  Final   Special  Requests Blood Culture adequate volume  Final   Culture NO GROWTH 5 DAYS  Final   Report Status 01/04/2017 FINAL  Final  Culture, blood (Routine X 2) w Reflex to ID Panel     Status: None   Collection Time: 12/30/16 10:47 PM  Result Value Ref Range Status   Specimen Description LEFT ANTECUBITAL  Final   Special Requests Blood Culture adequate volume  Final   Culture NO GROWTH 5 DAYS  Final   Report Status 01/04/2017 FINAL  Final  MRSA PCR Screening     Status: None   Collection Time: 12/31/16 10:08 AM  Result Value Ref Range Status   MRSA by PCR NEGATIVE NEGATIVE Final    Comment:        The GeneXpert MRSA Assay (FDA approved for NASAL specimens only), is one component of a comprehensive MRSA colonization surveillance program. It is not intended to diagnose MRSA infection nor  to guide or monitor treatment for MRSA infections.    Studies/Results: No results found.  Medications:  Prior to Admission:  Prescriptions Prior to Admission  Medication Sig Dispense Refill Last Dose  . acetaminophen (TYLENOL) 500 MG tablet Take 500-1,000 mg by mouth as needed for mild pain.    unknown  . albuterol (PROVENTIL HFA;VENTOLIN HFA) 108 (90 Base) MCG/ACT inhaler Inhale 2 puffs into the lungs 4 (four) times daily.    unknown  . apixaban (ELIQUIS) 5 MG TABS tablet Take 1 tablet (5 mg total) by mouth 2 (two) times daily. 60 tablet 5 12/30/2016 at unknown  . baclofen (LIORESAL) 20 MG tablet Take 20 mg by mouth 2 (two) times daily.  1 12/30/2016 at Unknown time  . cetirizine (ZYRTEC) 10 MG tablet Take 10 mg by mouth daily.   unknown  . cyclobenzaprine (FLEXERIL) 10 MG tablet Take 10 mg by mouth 3 (three) times daily as needed for muscle spasms.   12/30/2016 at Unknown time  . diltiazem (CARDIZEM CD) 120 MG 24 hr capsule Take 1 capsule (120 mg total) by mouth daily. 90 capsule 3 12/30/2016 at Unknown time  . fluticasone furoate-vilanterol (BREO ELLIPTA) 200-25 MCG/INH AEPB Inhale 1 puff into the lungs daily.   unknown  . gabapentin (NEURONTIN) 300 MG capsule Take 600 mg by mouth at bedtime. restless leg syndrome   12/30/2016 at Unknown time  . HYDROcodone-acetaminophen (NORCO/VICODIN) 5-325 MG tablet Take 1 tablet by mouth every 4 (four) hours as needed for moderate pain or severe pain. 10 tablet 0 12/30/2016 at Unknown time  . ibuprofen (ADVIL,MOTRIN) 200 MG tablet Take 400 mg by mouth daily as needed for mild pain or moderate pain.   unknown  . levothyroxine (SYNTHROID, LEVOTHROID) 137 MCG tablet Take 137 mcg by mouth daily before breakfast.   12/30/2016 at Unknown time  . Magnesium 250 MG TABS Take 2 tablets by mouth 2 (two) times daily.    unknown  . methylPREDNISolone (MEDROL DOSEPAK) 4 MG TBPK tablet Take 1 tablet by mouth daily as needed. For gout  2 Past Week at Unknown time  . metoprolol  tartrate (LOPRESSOR) 25 MG tablet TAKE 1/2 TABLET BY MOUTH TWICE DAILY 90 tablet 1 12/30/2016 at Unknown time  . omeprazole (PRILOSEC) 20 MG capsule TAKE ONE CAPSULE BY MOUTH DAILY 90 capsule 3 12/30/2016 at Unknown time  . potassium chloride SA (K-DUR,KLOR-CON) 20 MEQ tablet Take 1 tablet (20 mEq total) by mouth daily.   12/30/2016 at Unknown time  . torsemide (DEMADEX) 20 MG tablet Take 3 tablets (60 mg  total) by mouth 2 (two) times daily. (may take an extra  as needed)   12/30/2016 at Unknown time  . triamcinolone (NASACORT ALLERGY 24HR) 55 MCG/ACT AERO nasal inhaler Place 2 sprays into the nose daily as needed (for allergies).    unknown   Scheduled: . ALPRAZolam  0.25 mg Oral BID  . apixaban  5 mg Oral BID  . chlorhexidine  15 mL Mouth Rinse BID  . clindamycin (CLEOCIN) IV  600 mg Intravenous Q8H  . diltiazem  120 mg Oral Daily  . feeding supplement (GLUCERNA SHAKE)  237 mL Oral TID BM  . fentaNYL (SUBLIMAZE) injection  25 mcg Intravenous Once  . fluticasone furoate-vilanterol  1 puff Inhalation Daily  . furosemide  40 mg Intravenous Q12H  . gabapentin  300 mg Oral BID  . insulin aspart  0-20 Units Subcutaneous TID WC  . insulin glargine  20 Units Subcutaneous Daily  . levothyroxine  137 mcg Oral QAC breakfast  . mouth rinse  15 mL Mouth Rinse q12n4p  . metoprolol tartrate  12.5 mg Oral BID  . pantoprazole  40 mg Oral Daily  . potassium chloride  20 mEq Oral BID  . predniSONE  40 mg Oral Q breakfast   Continuous:  ZOX:[id number] **OR** acetaminophen, albuterol, naLOXone (NARCAN)  injection, ondansetron **OR** ondansetron (ZOFRAN) IV, polyvinyl alcohol  Assesment: She was admitted with acute encephalopathy that is likely multifactorial. She has improved substantially. She does however continue to have some trouble with cellulitis and is still on antibiotics for that. She has pain in her legs. I'm going to increase her gabapentin. Her blood sugar is up but I think it's because she is  on steroids. It has been recommended that she go to skilled care facility for rehabilitation. Principal Problem:   Acute encephalopathy Active Problems:   Hypothyroidism   PAF (paroxysmal atrial fibrillation) (HCC)   Cellulitis of left lower extremity   Chronic anticoagulation   Acute lower UTI   Pain, dental   Spasm of back muscles   Hyperglycemia   Metabolic encephalopathy    Plan: Increase gabapentin. Increase insulin. Switch to oral prednisone. Potential transfer to skilled care facility in the next 24-48 hours    LOS: 7 days   Delaney Perona L 01/06/2017, 10:12 AM

## 2017-01-07 LAB — GLUCOSE, CAPILLARY
Glucose-Capillary: 133 mg/dL — ABNORMAL HIGH (ref 65–99)
Glucose-Capillary: 237 mg/dL — ABNORMAL HIGH (ref 65–99)

## 2017-01-07 MED ORDER — ACETAMINOPHEN 325 MG PO TABS: 650.0000 mg | ORAL_TABLET | Freq: Four times a day (QID) | ORAL | Status: DC | PRN

## 2017-01-07 MED ORDER — POLYVINYL ALCOHOL 1.4 % OP SOLN: 1.0000 [drp] | Freq: Four times a day (QID) | OPHTHALMIC | 0 refills | Status: DC | PRN

## 2017-01-07 MED ORDER — GLUCERNA SHAKE PO LIQD: 237.0000 mL | Freq: Three times a day (TID) | ORAL | 0 refills | Status: DC

## 2017-01-07 MED ORDER — ONDANSETRON HCL 4 MG PO TABS: 4.0000 mg | ORAL_TABLET | Freq: Four times a day (QID) | ORAL | 0 refills | Status: DC | PRN

## 2017-01-07 MED ORDER — ALPRAZOLAM 0.25 MG PO TABS: 0.2500 mg | ORAL_TABLET | Freq: Two times a day (BID) | ORAL | 0 refills | Status: DC

## 2017-01-07 MED ORDER — TORSEMIDE 20 MG PO TABS: 20.0000 mg | ORAL_TABLET | Freq: Two times a day (BID) | ORAL | Status: DC

## 2017-01-07 MED ORDER — CLINDAMYCIN HCL 300 MG PO CAPS: 300.0000 mg | ORAL_CAPSULE | Freq: Three times a day (TID) | ORAL | 0 refills | Status: AC

## 2017-01-07 MED ORDER — PREDNISONE 10 MG (21) PO TBPK: ORAL_TABLET | ORAL | 0 refills | Status: DC

## 2017-01-07 MED ORDER — GABAPENTIN 300 MG PO CAPS: 300.0000 mg | ORAL_CAPSULE | Freq: Two times a day (BID) | ORAL | Status: DC

## 2017-01-07 MED ORDER — INSULIN GLARGINE 100 UNIT/ML ~~LOC~~ SOLN: 20.0000 [IU] | Freq: Every day | SUBCUTANEOUS | 11 refills | Status: DC

## 2017-01-07 MED ORDER — INSULIN ASPART 100 UNIT/ML ~~LOC~~ SOLN: 0.0000 [IU] | Freq: Three times a day (TID) | SUBCUTANEOUS | 11 refills | Status: DC

## 2017-01-07 NOTE — Progress Notes (Signed)
LCSW following for disposition. Patient accepted to [facility].  Patient planning to d/c today. LCSW has submitted insurance authorization and awaiting review.    Updated pt and family, agreeable with plan. Patient to transport via EMS, will arrange once ready.  All information sent to [facility] via the hub. No other d/c needs  15:39-authorization received from [employer] [phone], RUG level RVB. Next review date 01/09/17. Communicated this to [place]. Report # provided to RN and called [facility] for transport.  Plan: DC to [facility] SNF today.  Ilean Skill, MSW, LCSW Clinical Social Work 01/07/2017 [phone]

## 2017-01-07 NOTE — Care Management Note (Signed)
Case Management Note  Patient Details  Name: KIALEE KHAM MRN: [id number] Date of Birth: April 30, 1941   Expected Discharge Date:      01/07/2017           Expected Discharge Plan:  Skilled Nursing Facility  In-House Referral:  Clinical Social Work  Discharge planning Services  CM Consult  Post Acute Care Choice:  NA Choice offered to:  NA  Status of Service:  Completed, signed off  Additional Comments: Pt discharging today. PT has recommended SNF. CSW aware and of recommendation and will made arrangements for placement. No CM needs at this time.   Malcolm Metro, RN 01/07/2017, 10:06 AM

## 2017-01-07 NOTE — Discharge Summary (Signed)
Physician Discharge Summary  Patient ID: Yolanda [name] MRN: [id number] DOB/AGE: 1941-06-14 76 y.o. Primary Care Physician:Christol Thetford L, MD Admit date: 12/30/2016 Discharge date: 01/07/2017    Discharge Diagnoses:   Principal Problem:   Acute encephalopathy Active Problems:   GERD (gastroesophageal reflux disease)   HTN (hypertension)   Hypothyroidism   PAF (paroxysmal atrial fibrillation) (HCC)   Cellulitis of left lower extremity   Chronic anticoagulation   Obesity   Acute lower UTI   Pain, dental   Spasm of back muscles   Hyperglycemia   Metabolic encephalopathy   Allergies as of 01/07/2017      Reactions   Ciprofloxacin Shortness Of Breath   REACTION: sob,tachycardia   Doxycycline Nausea Only   Also experienced diarrhea    Fish Oil    Patient face drew to the side,Bells [place]   Guaifenesin Shortness Of Breath   REACTION: sob,tachycardia   Mucinex [guaifenesin Er] Shortness Of Breath   Sulfamethoxazole W/trimethoprim 800-160 [sulfamethoxazole-trimethoprim] Nausea Only   Also lack of appetite    Uloric [febuxostat] Swelling   No urination   Adhesive [tape] Other (See Comments)   Causes blisters on skin   Allopurinol Other (See Comments)   Couldn't urinate   Cefuroxime Axetil Swelling   Swelling all over body-per patient she was hospitalized as a result of taking this medication   Metolazone Nausea Only   Swelling    Tramadol    insomnia      Medication List    STOP taking these medications   cyclobenzaprine 10 MG tablet Commonly known as:  FLEXERIL   ibuprofen 200 MG tablet Commonly known as:  ADVIL,MOTRIN   methylPREDNISolone 4 MG Tbpk tablet Commonly known as:  MEDROL DOSEPAK     TAKE these medications   acetaminophen 325 MG tablet Commonly known as:  TYLENOL Take 2 tablets (650 mg total) by mouth every 6 (six) hours as needed for mild pain (or Fever >/= 101). What changed:  medication strength  how much to take  when to take  this  reasons to take this   albuterol 108 (90 Base) MCG/ACT inhaler Commonly known as:  PROVENTIL HFA;VENTOLIN HFA Inhale 2 puffs into the lungs 4 (four) times daily.   ALPRAZolam 0.25 MG tablet Commonly known as:  XANAX Take 1 tablet (0.25 mg total) by mouth 2 (two) times daily.   apixaban 5 MG Tabs tablet Commonly known as:  ELIQUIS Take 1 tablet (5 mg total) by mouth 2 (two) times daily.   baclofen 20 MG tablet Commonly known as:  LIORESAL Take 20 mg by mouth 2 (two) times daily.   BREO ELLIPTA 200-25 MCG/INH Aepb Generic drug:  fluticasone furoate-vilanterol Inhale 1 puff into the lungs daily.   cetirizine 10 MG tablet Commonly known as:  ZYRTEC Take 10 mg by mouth daily.   clindamycin 300 MG capsule Commonly known as:  CLEOCIN Take 1 capsule (300 mg total) by mouth 3 (three) times daily.   diltiazem 120 MG 24 hr capsule Commonly known as:  CARDIZEM CD Take 1 capsule (120 mg total) by mouth daily.   feeding supplement (GLUCERNA SHAKE) Liqd Take 237 mLs by mouth 3 (three) times daily between meals.   gabapentin 300 MG capsule Commonly known as:  NEURONTIN Take 1 capsule (300 mg total) by mouth 2 (two) times daily. What changed:  how much to take  when to take this  additional instructions   HYDROcodone-acetaminophen 5-325 MG tablet Commonly known as:  NORCO/VICODIN Take 1 tablet  by mouth every 4 (four) hours as needed for moderate pain or severe pain.   insulin aspart 100 UNIT/ML injection Commonly known as:  novoLOG Inject 0-20 Units into the skin 3 (three) times daily with meals.   insulin glargine 100 UNIT/ML injection Commonly known as:  LANTUS Inject 0.2 mLs (20 Units total) into the skin daily.   levothyroxine 137 MCG tablet Commonly known as:  SYNTHROID, LEVOTHROID Take 137 mcg by mouth daily before breakfast.   Magnesium 250 MG Tabs Take 2 tablets by mouth 2 (two) times daily.   metoprolol tartrate 25 MG tablet Commonly known as:   LOPRESSOR TAKE 1/2 TABLET BY MOUTH TWICE DAILY   NASACORT ALLERGY 24HR 55 MCG/ACT Aero nasal inhaler Generic drug:  triamcinolone Place 2 sprays into the nose daily as needed (for allergies).   omeprazole 20 MG capsule Commonly known as:  PRILOSEC TAKE ONE CAPSULE BY MOUTH DAILY   ondansetron 4 MG tablet Commonly known as:  ZOFRAN Take 1 tablet (4 mg total) by mouth every 6 (six) hours as needed for nausea.   polyvinyl alcohol 1.4 % ophthalmic solution Commonly known as:  LIQUIFILM TEARS Place 1 drop into both eyes 4 (four) times daily as needed for dry eyes.   potassium chloride SA 20 MEQ tablet Commonly known as:  K-DUR,KLOR-CON Take 1 tablet (20 mEq total) by mouth daily.   predniSONE 10 MG (21) Tbpk tablet Commonly known as:  STERAPRED UNI-PAK 21 TAB Take by package directions   torsemide 20 MG tablet Commonly known as:  DEMADEX Take 1 tablet (20 mg total) by mouth 2 (two) times daily. (may take an extra  as needed) What changed:  how much to take       Discharged Condition:Improved    Consults: Neurology  Significant Diagnostic Studies: Dg Chest 2 View  Result Date: 12/30/2016 CLINICAL DATA:  Weakness, disoriented EXAM: CHEST  2 VIEW COMPARISON:  09/02/2016 FINDINGS: Stable cardiomegaly without edema or CHF. No focal pneumonia, collapse or consolidation. No significant effusion or pneumothorax. Trachea is midline. Atherosclerosis noted of the aorta. Monitor leads overlie the chest. Bones are osteopenic. Degenerative changes noted spine. Obese body habitus evident. IMPRESSION: Stable chest exam. No superimposed acute process or interval change. Cardiomegaly without CHF Thoracic aortic atherosclerosis Electronically Signed   By: Judie Petit.  Shick M.D.   On: 12/30/2016 14:45   Dg Shoulder Right  Result Date: 12/27/2016 CLINICAL DATA:  Patient states right shoulder pain all across her shoulder blade with no known injury. She has spasms and contractions in the shoulder and  they come and go about every minute. Hx of arthritis. EXAM: RIGHT SHOULDER - 2+ VIEW COMPARISON:  None. FINDINGS: No fracture or dislocation. The glenohumeral joint is well preserved. There are mild AC joint osteoarthritic changes. The bones are diffusely demineralized. Soft tissues are unremarkable. IMPRESSION: No fracture or dislocation.  Mild AC joint osteoarthritis. Electronically Signed   By: Amie Portland M.D.   On: 12/27/2016 19:46   Ct Head Wo Contrast  Result Date: 12/31/2016 CLINICAL DATA:  Altered mental status.  Recent falls. EXAM: CT HEAD WITHOUT CONTRAST TECHNIQUE: Contiguous axial images were obtained from the base of the skull through the vertex without intravenous contrast. COMPARISON:  12/30/2016 FINDINGS: Brain: There is no evidence of acute cortical infarct, intracranial hemorrhage, mass, midline shift, or extra-axial fluid collection. The ventricles and sulci are normal for age. Scattered cerebral white matter hypodensities are unchanged and nonspecific but compatible with mild chronic small vessel ischemic disease. Vascular:  Calcified atherosclerosis at the skullbase. No hyperdense vessel. Skull: No fracture or focal osseous lesion. Sinuses/Orbits: Paranasal sinuses and mastoid air cells are clear. Mild bilateral exophthalmos. Other: None. IMPRESSION: 1. No evidence of acute intracranial abnormality. 2. Mild chronic small vessel ischemic disease. Electronically Signed   By: Sebastian Ache M.D.   On: 12/31/2016 14:17   Ct Head Wo Contrast  Result Date: 12/30/2016 CLINICAL DATA:  Altered mental status, on Eliquis EXAM: CT HEAD WITHOUT CONTRAST TECHNIQUE: Contiguous axial images were obtained from the base of the skull through the vertex without intravenous contrast. COMPARISON:  None. FINDINGS: Brain: No evidence of acute infarction, hemorrhage, hydrocephalus, extra-axial collection or mass lesion/mass effect. Mild age related atrophy. Mild subcortical white matter and periventricular small  vessel ischemic changes. Vascular: Intracranial atherosclerosis. Skull: Normal. Negative for fracture or focal lesion. Sinuses/Orbits: The visualized paranasal sinuses are essentially clear. The mastoid air cells are unopacified. Other: None. IMPRESSION: No evidence of acute intracranial abnormality. Mild atrophy with small vessel ischemic changes. Electronically Signed   By: Charline Bills M.D.   On: 12/30/2016 17:08   Dg Lumbar Spine 1 View  Result Date: 12/31/2016 CLINICAL DATA:  Multiple falls, severe pain. EXAM: LUMBAR SPINE - 1 VIEW COMPARISON:  MRI of lumbar spine April 25, 2015 FINDINGS: Single AP view of the lumbar spine. The vertebral bodies appear intact on this frontal radiograph. Severe lower lumbar facet arthropathy. No destructive bony lesions. Sacroiliac joints symmetric. No destructive bony lesions. Surgical clips in the included right abdomen compatible with cholecystectomy. IMPRESSION: Limited AP view without fracture deformity though lateral radiograph would be more sensitive. Electronically Signed   By: Awilda Metro M.D.   On: 12/31/2016 14:21   US Renal  Result Date: 12/31/2016 CLINICAL DATA:  Dysuria, UTI.  Chronic kidney disease. EXAM: RENAL / URINARY TRACT ULTRASOUND COMPLETE COMPARISON:  03/19/2016 FINDINGS: Right Kidney: Length: 10.5 cm. Cortical thinning. Normal echotexture. No hydronephrosis or mass. Left Kidney: Length: 10.0 cm. Cortical thinning. Normal echotexture. No hydronephrosis or mass. Bladder: Appears normal for degree of bladder distention. IMPRESSION: No acute findings.  Mild cortical thinning.  No hydronephrosis. Electronically Signed   By: Charlett Nose M.D.   On: 12/31/2016 12:24   Dg Hips Bilat With Pelvis Min 5 Views  Result Date: 12/31/2016 CLINICAL DATA:  Recent falls, severe pain. EXAM: DG HIP (WITH OR WITHOUT PELVIS) 5+V BILAT COMPARISON:  None. FINDINGS: There is no evidence of hip fracture or dislocation. There is no evidence of advanced arthropathy  or other focal bone abnormality. 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 Mild vascular calcifications. IMPRESSION: Negative. Electronically Signed   By: Awilda Metro M.D.   On: 12/31/2016 14:22    Lab Results: Basic Metabolic Panel: No results for input(s): NA, K, CL, CO2, GLUCOSE, BUN, CREATININE, CALCIUM, MG, PHOS in the last 72 hours. Liver Function Tests: No results for input(s): AST, ALT, ALKPHOS, BILITOT, PROT, ALBUMIN in the last 72 hours.   CBC: No results for input(s): WBC, NEUTROABS, HGB, HCT, MCV, PLT in the last 72 hours.  Recent Results (from the past 240 hour(s))  Culture, Urine     Status: Abnormal   Collection Time: 12/30/16  3:38 PM  Result Value Ref Range Status   Specimen Description URINE, CLEAN CATCH  Final   Special Requests NONE  Final   Culture MULTIPLE SPECIES PRESENT, SUGGEST RECOLLECTION (A)  Final   Report Status 01/01/2017 FINAL  Final  Culture, blood (Routine X 2) w Reflex to  ID Panel     Status: None   Collection Time: 12/30/16 10:40 PM  Result Value Ref Range Status   Specimen Description BLOOD LEFT ARM  Final   Special Requests Blood Culture adequate volume  Final   Culture NO GROWTH 5 DAYS  Final   Report Status 01/04/2017 FINAL  Final  Culture, blood (Routine X 2) w Reflex to ID Panel     Status: None   Collection Time: 12/30/16 10:47 PM  Result Value Ref Range Status   Specimen Description LEFT ANTECUBITAL  Final   Special Requests Blood Culture adequate volume  Final   Culture NO GROWTH 5 DAYS  Final   Report Status 01/04/2017 FINAL  Final  MRSA PCR Screening     Status: None   Collection Time: 12/31/16 10:08 AM  Result Value Ref Range Status   MRSA by PCR NEGATIVE NEGATIVE Final    Comment:        The GeneXpert MRSA Assay (FDA approved for NASAL specimens only), is one component of a comprehensive MRSA colonization surveillance program. It is not intended to diagnose MRSA infection nor to guide or monitor  treatment for MRSA infections.      Hospital Course: This is a 76 year old who came to the hospital because of confusion. She was presumed to have urinary tract infection and did have cellulitis of her leg. She improved over the next several days. By the time of discharge she was back to her normal mental status her leg was much better than she was very weak. The she is going to be transferred to a skilled care facility.  Discharge Exam: Blood pressure (!) 152/63, pulse 67, temperature 97.9 F (36.6 C), temperature source Oral, resp. rate 19, height  (1.575 m), weight 103.2 kg (227 lb 8.2 oz), SpO2 97 %. Skin warm and dry. Chest is clear. Heart is regular. She is morbidly obese  Disposition: To go to skilled care facility. She will need clindamycin for 5 more days. She will need to have daily weights. Basic metabolic profile in 3 days. PTOT and speech consultations.      Signed: Makilah Dowda L   01/07/2017, 8:57 AM

## 2017-01-07 NOTE — Care Management Important Message (Signed)
Important Message  Patient Details  Name: Yolanda [name] MRN: [id number] Date of Birth: 1941/05/14   Medicare Important Message Given:  Yes    Malcolm Metro, RN 01/07/2017, 10:06 AM

## 2017-01-07 NOTE — Progress Notes (Addendum)
Reported to Stan Head, LPN at [facility] and [facility].

## 2017-01-07 NOTE — Progress Notes (Signed)
Subjective: She says she feels better. She still has some numbness in her feet. She was able to get up yesterday. No chest pain no nausea no vomiting. Her breathing is doing well.  Objective: Vital signs in last 24 hours: Temp:  [97.6 F (36.4 C)-98.5 F (36.9 C)] 97.9 F (36.6 C) (04/16 0512) Pulse Rate:  [67-78] 67 (04/16 0512) Resp:  [17-19] 19 (04/16 0512) BP: (141-152)/(60-72) 152/63 (04/16 0512) SpO2:  [97 %-100 %] 97 % (04/16 0730) Weight change:  Last BM Date: 01/03/17  Intake/Output from previous day: 04/15 0701 - 04/16 0700 In: 730 [P.O.:480; IV Piggyback:250] Out: 2000 [Urine:2000]  PHYSICAL EXAM General appearance: alert, cooperative, mild distress and morbidly obese Resp: clear to auscultation bilaterally Cardio: regular rate and rhythm, S1, S2 normal, no murmur, click, rub or gallop GI: soft, non-tender; bowel sounds normal; no masses,  no organomegaly Extremities: She has swelling of her feet Skin warm and dry  Lab Results:  Results for orders placed or performed during the hospital encounter of 12/30/16 (from the past 48 hour(s))  Glucose, capillary     Status: Abnormal   Collection Time: 01/05/17 12:28 PM  Result Value Ref Range   Glucose-Capillary 400 (H) 65 - 99 mg/dL  Glucose, capillary     Status: Abnormal   Collection Time: 01/05/17  4:55 PM  Result Value Ref Range   Glucose-Capillary 353 (H) 65 - 99 mg/dL  Glucose, capillary     Status: Abnormal   Collection Time: 01/05/17  9:21 PM  Result Value Ref Range   Glucose-Capillary 305 (H) 65 - 99 mg/dL   Comment 1 Notify RN    Comment 2 Document in Chart   Glucose, capillary     Status: Abnormal   Collection Time: 01/06/17  7:59 AM  Result Value Ref Range   Glucose-Capillary 259 (H) 65 - 99 mg/dL   Comment 1 Notify RN    Comment 2 Document in Chart   Glucose, capillary     Status: Abnormal   Collection Time: 01/06/17 11:35 AM  Result Value Ref Range   Glucose-Capillary 389 (H) 65 - 99 mg/dL   Comment 1 Notify RN    Comment 2 Document in Chart   Glucose, capillary     Status: Abnormal   Collection Time: 01/06/17  4:00 PM  Result Value Ref Range   Glucose-Capillary 400 (H) 65 - 99 mg/dL   Comment 1 Notify RN    Comment 2 Document in Chart   Glucose, capillary     Status: Abnormal   Collection Time: 01/06/17  9:20 PM  Result Value Ref Range   Glucose-Capillary 300 (H) 65 - 99 mg/dL   Comment 1 Notify RN    Comment 2 Document in Chart   Glucose, capillary     Status: Abnormal   Collection Time: 01/07/17  8:14 AM  Result Value Ref Range   Glucose-Capillary 133 (H) 65 - 99 mg/dL   Comment 1 Notify RN    Comment 2 Document in Chart     ABGS No results for input(s): PHART, PO2ART, TCO2, HCO3 in the last 72 hours.  Invalid input(s): PCO2 CULTURES Recent Results (from the past 240 hour(s))  Culture, Urine     Status: Abnormal   Collection Time: 12/30/16  3:38 PM  Result Value Ref Range Status   Specimen Description URINE, CLEAN CATCH  Final   Special Requests NONE  Final   Culture MULTIPLE SPECIES PRESENT, SUGGEST RECOLLECTION (A)  Final   Report  Status 01/01/2017 FINAL  Final  Culture, blood (Routine X 2) w Reflex to ID Panel     Status: None   Collection Time: 12/30/16 10:40 PM  Result Value Ref Range Status   Specimen Description BLOOD LEFT ARM  Final   Special Requests Blood Culture adequate volume  Final   Culture NO GROWTH 5 DAYS  Final   Report Status 01/04/2017 FINAL  Final  Culture, blood (Routine X 2) w Reflex to ID Panel     Status: None   Collection Time: 12/30/16 10:47 PM  Result Value Ref Range Status   Specimen Description LEFT ANTECUBITAL  Final   Special Requests Blood Culture adequate volume  Final   Culture NO GROWTH 5 DAYS  Final   Report Status 01/04/2017 FINAL  Final  MRSA PCR Screening     Status: None   Collection Time: 12/31/16 10:08 AM  Result Value Ref Range Status   MRSA by PCR NEGATIVE NEGATIVE Final    Comment:        The GeneXpert  MRSA Assay (FDA approved for NASAL specimens only), is one component of a comprehensive MRSA colonization surveillance program. It is not intended to diagnose MRSA infection nor to guide or monitor treatment for MRSA infections.    Studies/Results: No results found.  Medications:  Prior to Admission:  Prescriptions Prior to Admission  Medication Sig Dispense Refill Last Dose  . acetaminophen (TYLENOL) 500 MG tablet Take 500-1,000 mg by mouth as needed for mild pain.    unknown  . albuterol (PROVENTIL HFA;VENTOLIN HFA) 108 (90 Base) MCG/ACT inhaler Inhale 2 puffs into the lungs 4 (four) times daily.    unknown  . apixaban (ELIQUIS) 5 MG TABS tablet Take 1 tablet (5 mg total) by mouth 2 (two) times daily. 60 tablet 5 12/30/2016 at unknown  . baclofen (LIORESAL) 20 MG tablet Take 20 mg by mouth 2 (two) times daily.  1 12/30/2016 at Unknown time  . cetirizine (ZYRTEC) 10 MG tablet Take 10 mg by mouth daily.   unknown  . cyclobenzaprine (FLEXERIL) 10 MG tablet Take 10 mg by mouth 3 (three) times daily as needed for muscle spasms.   12/30/2016 at Unknown time  . diltiazem (CARDIZEM CD) 120 MG 24 hr capsule Take 1 capsule (120 mg total) by mouth daily. 90 capsule 3 12/30/2016 at Unknown time  . fluticasone furoate-vilanterol (BREO ELLIPTA) 200-25 MCG/INH AEPB Inhale 1 puff into the lungs daily.   unknown  . gabapentin (NEURONTIN) 300 MG capsule Take 600 mg by mouth at bedtime. restless leg syndrome   12/30/2016 at Unknown time  . HYDROcodone-acetaminophen (NORCO/VICODIN) 5-325 MG tablet Take 1 tablet by mouth every 4 (four) hours as needed for moderate pain or severe pain. 10 tablet 0 12/30/2016 at Unknown time  . ibuprofen (ADVIL,MOTRIN) 200 MG tablet Take 400 mg by mouth daily as needed for mild pain or moderate pain.   unknown  . levothyroxine (SYNTHROID, LEVOTHROID) 137 MCG tablet Take 137 mcg by mouth daily before breakfast.   12/30/2016 at Unknown time  . Magnesium 250 MG TABS Take 2 tablets by mouth  2 (two) times daily.    unknown  . methylPREDNISolone (MEDROL DOSEPAK) 4 MG TBPK tablet Take 1 tablet by mouth daily as needed. For gout  2 Past Week at Unknown time  . metoprolol tartrate (LOPRESSOR) 25 MG tablet TAKE 1/2 TABLET BY MOUTH TWICE DAILY 90 tablet 1 12/30/2016 at Unknown time  . omeprazole (PRILOSEC) 20 MG capsule TAKE ONE  CAPSULE BY MOUTH DAILY 90 capsule 3 12/30/2016 at Unknown time  . potassium chloride SA (K-DUR,KLOR-CON) 20 MEQ tablet Take 1 tablet (20 mEq total) by mouth daily.   12/30/2016 at Unknown time  . torsemide (DEMADEX) 20 MG tablet Take 3 tablets (60 mg total) by mouth 2 (two) times daily. (may take an extra  as needed)   12/30/2016 at Unknown time  . triamcinolone (NASACORT ALLERGY 24HR) 55 MCG/ACT AERO nasal inhaler Place 2 sprays into the nose daily as needed (for allergies).    unknown   Scheduled: . ALPRAZolam  0.25 mg Oral BID  . apixaban  5 mg Oral BID  . chlorhexidine  15 mL Mouth Rinse BID  . clindamycin (CLEOCIN) IV  600 mg Intravenous Q8H  . diltiazem  120 mg Oral Daily  . feeding supplement (GLUCERNA SHAKE)  237 mL Oral TID BM  . fentaNYL (SUBLIMAZE) injection  25 mcg Intravenous Once  . fluticasone furoate-vilanterol  1 puff Inhalation Daily  . furosemide  40 mg Intravenous Q12H  . gabapentin  300 mg Oral BID  . insulin aspart  0-20 Units Subcutaneous TID WC  . insulin glargine  20 Units Subcutaneous Daily  . levothyroxine  137 mcg Oral QAC breakfast  . mouth rinse  15 mL Mouth Rinse q12n4p  . metoprolol tartrate  12.5 mg Oral BID  . pantoprazole  40 mg Oral Daily  . potassium chloride  20 mEq Oral BID  . predniSONE  40 mg Oral Q breakfast   Continuous:  [id number] **OR** acetaminophen, albuterol, naLOXone (NARCAN)  injection, ondansetron **OR** ondansetron (ZOFRAN) IV, polyvinyl alcohol  Assesment: She was admitted with acute encephalopathy. She had cellulitis of the left lower extremity and may be a urinary tract infection. She is  markedly improved. She is severely deconditioned. She is better and approaching being ready for transfer to skilled care facility. She has COPD at baseline. Principal Problem:   Acute encephalopathy Active Problems:   Hypothyroidism   PAF (paroxysmal atrial fibrillation) (HCC)   Cellulitis of left lower extremity   Chronic anticoagulation   Acute lower UTI   Pain, dental   Spasm of back muscles   Hyperglycemia   Metabolic encephalopathy    Plan: Potential transfer to skilled care facility today I would like to have the physical therapist evaluate her and see if she's ready for that transfer    LOS: 8 days   Euva Rundell L 01/07/2017, 8:48 AM

## 2017-01-09 ENCOUNTER — Ambulatory Visit: Payer: Medicare Other | Admitting: Cardiovascular Disease

## 2017-01-28 ENCOUNTER — Emergency Department (HOSPITAL_COMMUNITY): Payer: Medicare Other

## 2017-01-28 ENCOUNTER — Inpatient Hospital Stay (HOSPITAL_COMMUNITY)
Admission: EM | Admit: 2017-01-28 | Discharge: 2017-02-06 | DRG: 640 | Disposition: A | Discharge status: Skilled Nursing Facility | Payer: Medicare Other | Attending: Pulmonary Disease | Admitting: Pulmonary Disease

## 2017-01-28 ENCOUNTER — Encounter (HOSPITAL_COMMUNITY): Payer: Self-pay | Admitting: Emergency Medicine

## 2017-01-28 DIAGNOSIS — E1165 Type 2 diabetes mellitus with hyperglycemia: Secondary | ICD-10-CM | POA: Diagnosis present

## 2017-01-28 DIAGNOSIS — Z87891 Personal history of nicotine dependence: Secondary | ICD-10-CM

## 2017-01-28 DIAGNOSIS — I130 Hypertensive heart and chronic kidney disease with heart failure and stage 1 through stage 4 chronic kidney disease, or unspecified chronic kidney disease: Secondary | ICD-10-CM | POA: Diagnosis present

## 2017-01-28 DIAGNOSIS — R739 Hyperglycemia, unspecified: Secondary | ICD-10-CM | POA: Diagnosis present

## 2017-01-28 DIAGNOSIS — Y92230 Patient room in hospital as the place of occurrence of the external cause: Secondary | ICD-10-CM | POA: Diagnosis not present

## 2017-01-28 DIAGNOSIS — Z91048 Other nonmedicinal substance allergy status: Secondary | ICD-10-CM

## 2017-01-28 DIAGNOSIS — E871 Hypo-osmolality and hyponatremia: Secondary | ICD-10-CM | POA: Diagnosis present

## 2017-01-28 DIAGNOSIS — E861 Hypovolemia: Secondary | ICD-10-CM | POA: Diagnosis present

## 2017-01-28 DIAGNOSIS — R7989 Other specified abnormal findings of blood chemistry: Secondary | ICD-10-CM | POA: Diagnosis not present

## 2017-01-28 DIAGNOSIS — R1310 Dysphagia, unspecified: Secondary | ICD-10-CM | POA: Diagnosis not present

## 2017-01-28 DIAGNOSIS — K3189 Other diseases of stomach and duodenum: Secondary | ICD-10-CM | POA: Diagnosis not present

## 2017-01-28 DIAGNOSIS — K2970 Gastritis, unspecified, without bleeding: Secondary | ICD-10-CM | POA: Diagnosis present

## 2017-01-28 DIAGNOSIS — M792 Neuralgia and neuritis, unspecified: Secondary | ICD-10-CM | POA: Diagnosis present

## 2017-01-28 DIAGNOSIS — Z6838 Body mass index (BMI) 38.0-38.9, adult: Secondary | ICD-10-CM

## 2017-01-28 DIAGNOSIS — N170 Acute kidney failure with tubular necrosis: Secondary | ICD-10-CM | POA: Diagnosis present

## 2017-01-28 DIAGNOSIS — E876 Hypokalemia: Secondary | ICD-10-CM | POA: Diagnosis not present

## 2017-01-28 DIAGNOSIS — Z7951 Long term (current) use of inhaled steroids: Secondary | ICD-10-CM | POA: Diagnosis not present

## 2017-01-28 DIAGNOSIS — T501X5A Adverse effect of loop [high-ceiling] diuretics, initial encounter: Secondary | ICD-10-CM | POA: Diagnosis not present

## 2017-01-28 DIAGNOSIS — K219 Gastro-esophageal reflux disease without esophagitis: Secondary | ICD-10-CM | POA: Diagnosis present

## 2017-01-28 DIAGNOSIS — Z8249 Family history of ischemic heart disease and other diseases of the circulatory system: Secondary | ICD-10-CM

## 2017-01-28 DIAGNOSIS — L03116 Cellulitis of left lower limb: Secondary | ICD-10-CM | POA: Diagnosis present

## 2017-01-28 DIAGNOSIS — G4733 Obstructive sleep apnea (adult) (pediatric): Secondary | ICD-10-CM | POA: Diagnosis present

## 2017-01-28 DIAGNOSIS — N183 Chronic kidney disease, stage 3 (moderate): Secondary | ICD-10-CM | POA: Diagnosis present

## 2017-01-28 DIAGNOSIS — E1122 Type 2 diabetes mellitus with diabetic chronic kidney disease: Secondary | ICD-10-CM | POA: Diagnosis present

## 2017-01-28 DIAGNOSIS — I480 Paroxysmal atrial fibrillation: Secondary | ICD-10-CM | POA: Diagnosis present

## 2017-01-28 DIAGNOSIS — K760 Fatty (change of) liver, not elsewhere classified: Secondary | ICD-10-CM | POA: Diagnosis present

## 2017-01-28 DIAGNOSIS — F329 Major depressive disorder, single episode, unspecified: Secondary | ICD-10-CM | POA: Diagnosis present

## 2017-01-28 DIAGNOSIS — I872 Venous insufficiency (chronic) (peripheral): Secondary | ICD-10-CM | POA: Diagnosis present

## 2017-01-28 DIAGNOSIS — I5033 Acute on chronic diastolic (congestive) heart failure: Secondary | ICD-10-CM | POA: Diagnosis not present

## 2017-01-28 DIAGNOSIS — R1314 Dysphagia, pharyngoesophageal phase: Secondary | ICD-10-CM | POA: Diagnosis not present

## 2017-01-28 DIAGNOSIS — I959 Hypotension, unspecified: Secondary | ICD-10-CM | POA: Diagnosis present

## 2017-01-28 DIAGNOSIS — E039 Hypothyroidism, unspecified: Secondary | ICD-10-CM | POA: Diagnosis present

## 2017-01-28 DIAGNOSIS — E669 Obesity, unspecified: Secondary | ICD-10-CM | POA: Diagnosis present

## 2017-01-28 DIAGNOSIS — R0602 Shortness of breath: Secondary | ICD-10-CM

## 2017-01-28 DIAGNOSIS — R197 Diarrhea, unspecified: Secondary | ICD-10-CM | POA: Diagnosis not present

## 2017-01-28 DIAGNOSIS — Z801 Family history of malignant neoplasm of trachea, bronchus and lung: Secondary | ICD-10-CM

## 2017-01-28 DIAGNOSIS — Z882 Allergy status to sulfonamides status: Secondary | ICD-10-CM

## 2017-01-28 DIAGNOSIS — E440 Moderate protein-calorie malnutrition: Secondary | ICD-10-CM | POA: Diagnosis present

## 2017-01-28 DIAGNOSIS — M1A9XX0 Chronic gout, unspecified, without tophus (tophi): Secondary | ICD-10-CM | POA: Diagnosis present

## 2017-01-28 DIAGNOSIS — E860 Dehydration: Secondary | ICD-10-CM | POA: Diagnosis present

## 2017-01-28 DIAGNOSIS — Z794 Long term (current) use of insulin: Secondary | ICD-10-CM

## 2017-01-28 DIAGNOSIS — Z833 Family history of diabetes mellitus: Secondary | ICD-10-CM

## 2017-01-28 DIAGNOSIS — K222 Esophageal obstruction: Secondary | ICD-10-CM | POA: Diagnosis not present

## 2017-01-28 DIAGNOSIS — K449 Diaphragmatic hernia without obstruction or gangrene: Secondary | ICD-10-CM | POA: Diagnosis present

## 2017-01-28 DIAGNOSIS — I10 Essential (primary) hypertension: Secondary | ICD-10-CM | POA: Diagnosis present

## 2017-01-28 DIAGNOSIS — R945 Abnormal results of liver function studies: Secondary | ICD-10-CM

## 2017-01-28 DIAGNOSIS — F419 Anxiety disorder, unspecified: Secondary | ICD-10-CM | POA: Diagnosis present

## 2017-01-28 DIAGNOSIS — Z881 Allergy status to other antibiotic agents status: Secondary | ICD-10-CM | POA: Diagnosis not present

## 2017-01-28 DIAGNOSIS — R933 Abnormal findings on diagnostic imaging of other parts of digestive tract: Secondary | ICD-10-CM | POA: Diagnosis not present

## 2017-01-28 DIAGNOSIS — K529 Noninfective gastroenteritis and colitis, unspecified: Secondary | ICD-10-CM | POA: Diagnosis present

## 2017-01-28 DIAGNOSIS — E038 Other specified hypothyroidism: Secondary | ICD-10-CM | POA: Diagnosis present

## 2017-01-28 DIAGNOSIS — D509 Iron deficiency anemia, unspecified: Secondary | ICD-10-CM | POA: Diagnosis present

## 2017-01-28 DIAGNOSIS — Z9119 Patient's noncompliance with other medical treatment and regimen: Secondary | ICD-10-CM

## 2017-01-28 DIAGNOSIS — M109 Gout, unspecified: Secondary | ICD-10-CM | POA: Diagnosis present

## 2017-01-28 DIAGNOSIS — Z885 Allergy status to narcotic agent status: Secondary | ICD-10-CM

## 2017-01-28 DIAGNOSIS — Z888 Allergy status to other drugs, medicaments and biological substances status: Secondary | ICD-10-CM

## 2017-01-28 DIAGNOSIS — J449 Chronic obstructive pulmonary disease, unspecified: Secondary | ICD-10-CM | POA: Diagnosis present

## 2017-01-28 DIAGNOSIS — K259 Gastric ulcer, unspecified as acute or chronic, without hemorrhage or perforation: Secondary | ICD-10-CM | POA: Diagnosis present

## 2017-01-28 DIAGNOSIS — Z7901 Long term (current) use of anticoagulants: Secondary | ICD-10-CM | POA: Diagnosis not present

## 2017-01-28 DIAGNOSIS — R1311 Dysphagia, oral phase: Secondary | ICD-10-CM | POA: Diagnosis present

## 2017-01-28 DIAGNOSIS — E1142 Type 2 diabetes mellitus with diabetic polyneuropathy: Secondary | ICD-10-CM | POA: Diagnosis present

## 2017-01-28 DIAGNOSIS — E1169 Type 2 diabetes mellitus with other specified complication: Secondary | ICD-10-CM | POA: Diagnosis present

## 2017-01-28 DIAGNOSIS — G4730 Sleep apnea, unspecified: Secondary | ICD-10-CM | POA: Diagnosis present

## 2017-01-28 DIAGNOSIS — K317 Polyp of stomach and duodenum: Secondary | ICD-10-CM | POA: Diagnosis present

## 2017-01-28 DIAGNOSIS — N179 Acute kidney failure, unspecified: Secondary | ICD-10-CM | POA: Diagnosis present

## 2017-01-28 DIAGNOSIS — D649 Anemia, unspecified: Secondary | ICD-10-CM | POA: Diagnosis not present

## 2017-01-28 DIAGNOSIS — R1319 Other dysphagia: Secondary | ICD-10-CM

## 2017-01-28 LAB — URINALYSIS, ROUTINE W REFLEX MICROSCOPIC
BILIRUBIN URINE: NEGATIVE
GLUCOSE, UA: NEGATIVE mg/dL
Ketones, ur: NEGATIVE mg/dL
NITRITE: NEGATIVE
Protein, ur: 100 mg/dL — AB
SPECIFIC GRAVITY, URINE: 1.004 — AB (ref 1.005–1.030)
pH: 5 (ref 5.0–8.0)

## 2017-01-28 LAB — COMPREHENSIVE METABOLIC PANEL
ALK PHOS: 206 U/L — AB (ref 38–126)
ALT: 53 U/L (ref 14–54)
ANION GAP: 9 (ref 5–15)
AST: 49 U/L — ABNORMAL HIGH (ref 15–41)
Albumin: 2.3 g/dL — ABNORMAL LOW (ref 3.5–5.0)
BILIRUBIN TOTAL: 2.6 mg/dL — AB (ref 0.3–1.2)
BUN: 18 mg/dL (ref 6–20)
CALCIUM: 8.4 mg/dL — AB (ref 8.9–10.3)
CO2: 29 mmol/L (ref 22–32)
CREATININE: 1.27 mg/dL — AB (ref 0.44–1.00)
Chloride: 77 mmol/L — ABNORMAL LOW (ref 101–111)
GFR, EST AFRICAN AMERICAN: 47 mL/min — AB (ref >60)
GFR, EST NON AFRICAN AMERICAN: 41 mL/min — AB (ref >60)
Glucose, Bld: 112 mg/dL — ABNORMAL HIGH (ref 65–99)
Potassium: 4.4 mmol/L (ref 3.5–5.1)
Sodium: 115 mmol/L — CL (ref 135–145)
TOTAL PROTEIN: 5.5 g/dL — AB (ref 6.5–8.1)

## 2017-01-28 LAB — CBC WITH DIFFERENTIAL/PLATELET
Basophils Absolute: 0 10*3/uL (ref 0.0–0.1)
Basophils Relative: 1 %
EOS ABS: 0.1 10*3/uL (ref 0.0–0.7)
EOS PCT: 2 %
HEMATOCRIT: 27.9 % — AB (ref 36.0–46.0)
HEMOGLOBIN: 9.7 g/dL — AB (ref 12.0–15.0)
LYMPHS ABS: 0.7 10*3/uL (ref 0.7–4.0)
Lymphocytes Relative: 13 %
MCH: 31.6 pg (ref 26.0–34.0)
MCHC: 34.8 g/dL (ref 30.0–36.0)
MCV: 90.9 fL (ref 78.0–100.0)
MONO ABS: 0.9 10*3/uL (ref 0.1–1.0)
Monocytes Relative: 17 %
Neutro Abs: 3.8 10*3/uL (ref 1.7–7.7)
Neutrophils Relative %: 68 %
Platelets: 280 10*3/uL (ref 150–400)
RBC: 3.07 MIL/uL — ABNORMAL LOW (ref 3.87–5.11)
RDW: 15.1 % (ref 11.5–15.5)
WBC: 5.6 10*3/uL (ref 4.0–10.5)

## 2017-01-28 LAB — BASIC METABOLIC PANEL
ANION GAP: 10 (ref 5–15)
BUN: 18 mg/dL (ref 6–20)
CHLORIDE: 78 mmol/L — AB (ref 101–111)
CO2: 28 mmol/L (ref 22–32)
Calcium: 8.3 mg/dL — ABNORMAL LOW (ref 8.9–10.3)
Creatinine, Ser: 1.28 mg/dL — ABNORMAL HIGH (ref 0.44–1.00)
GFR calc non Af Amer: 40 mL/min — ABNORMAL LOW (ref >60)
GFR, EST AFRICAN AMERICAN: 47 mL/min — AB (ref >60)
Glucose, Bld: 96 mg/dL (ref 65–99)
POTASSIUM: 4.2 mmol/L (ref 3.5–5.1)
SODIUM: 116 mmol/L — AB (ref 135–145)

## 2017-01-28 LAB — LIPASE, BLOOD: Lipase: 12 U/L (ref 11–51)

## 2017-01-28 LAB — GLUCOSE, CAPILLARY: Glucose-Capillary: 96 mg/dL (ref 65–99)

## 2017-01-28 LAB — CLOSTRIDIUM DIFFICILE BY PCR: Toxigenic C. Difficile by PCR: NEGATIVE

## 2017-01-28 LAB — LACTIC ACID, PLASMA: LACTIC ACID, VENOUS: 1 mmol/L (ref 0.5–1.9)

## 2017-01-28 LAB — TSH: TSH: 5.186 u[IU]/mL — AB (ref 0.350–4.500)

## 2017-01-28 LAB — URIC ACID: URIC ACID, SERUM: 14.6 mg/dL — AB (ref 2.3–6.6)

## 2017-01-28 LAB — TROPONIN I: Troponin I: <0.03 ng/mL (ref <0.03)

## 2017-01-28 LAB — C DIFFICILE QUICK SCREEN W PCR REFLEX
C DIFFICILE (CDIFF) TOXIN: NEGATIVE
C DIFFICLE (CDIFF) ANTIGEN: POSITIVE — AB

## 2017-01-28 LAB — BRAIN NATRIURETIC PEPTIDE: B Natriuretic Peptide: 80 pg/mL (ref 0.0–100.0)

## 2017-01-28 MED ORDER — SODIUM CHLORIDE 0.9% FLUSH
3.0000 mL | Freq: Two times a day (BID) | INTRAVENOUS | Status: DC
  Administered 2017-01-29 – 2017-02-06 (×15): 3 mL via INTRAVENOUS

## 2017-01-28 MED ORDER — ACETAMINOPHEN 650 MG RE SUPP: 650.0000 mg | Freq: Four times a day (QID) | RECTAL | Status: DC | PRN

## 2017-01-28 MED ORDER — ONDANSETRON HCL 4 MG/2ML IJ SOLN
4.0000 mg | Freq: Four times a day (QID) | INTRAMUSCULAR | Status: DC | PRN
  Administered 2017-01-29 – 2017-02-06 (×2): 4 mg via INTRAVENOUS
  Filled 2017-01-28 (×2): qty 2

## 2017-01-28 MED ORDER — GLUCERNA SHAKE PO LIQD
237.0000 mL | Freq: Three times a day (TID) | ORAL | Status: DC
  Administered 2017-01-29: 237 mL via ORAL

## 2017-01-28 MED ORDER — CLINDAMYCIN HCL 150 MG PO CAPS
300.0000 mg | ORAL_CAPSULE | Freq: Three times a day (TID) | ORAL | Status: DC
  Administered 2017-01-29 – 2017-01-30 (×4): 300 mg via ORAL
  Filled 2017-01-28 (×6): qty 1

## 2017-01-28 MED ORDER — ACETAMINOPHEN 325 MG PO TABS
650.0000 mg | ORAL_TABLET | Freq: Four times a day (QID) | ORAL | Status: DC | PRN
  Administered 2017-02-04 – 2017-02-05 (×3): 650 mg via ORAL
  Filled 2017-01-28 (×3): qty 2

## 2017-01-28 MED ORDER — VANCOMYCIN HCL IN DEXTROSE 1-5 GM/200ML-% IV SOLN
1000.0000 mg | Freq: Once | INTRAVENOUS | Status: AC
  Administered 2017-01-28: 1000 mg via INTRAVENOUS
  Filled 2017-01-28: qty 200

## 2017-01-28 MED ORDER — DILTIAZEM HCL ER COATED BEADS 120 MG PO CP24
120.0000 mg | ORAL_CAPSULE | Freq: Every day | ORAL | Status: DC
Start: 2017-01-29 — End: 2017-01-29

## 2017-01-28 MED ORDER — INSULIN ASPART 100 UNIT/ML ~~LOC~~ SOLN
0.0000 [IU] | Freq: Three times a day (TID) | SUBCUTANEOUS | Status: DC
  Administered 2017-01-29: 3 [IU] via SUBCUTANEOUS
  Administered 2017-01-30 – 2017-02-05 (×5): 2 [IU] via SUBCUTANEOUS

## 2017-01-28 MED ORDER — METOPROLOL TARTRATE 25 MG PO TABS
12.5000 mg | ORAL_TABLET | Freq: Two times a day (BID) | ORAL | Status: DC
  Administered 2017-01-29 – 2017-02-06 (×14): 12.5 mg via ORAL
  Filled 2017-01-28 (×15): qty 1

## 2017-01-28 MED ORDER — SODIUM CHLORIDE 0.9 % IV SOLN
INTRAVENOUS | Status: DC
  Administered 2017-01-28 – 2017-01-31 (×6): via INTRAVENOUS
  Administered 2017-01-31: 1000 mL via INTRAVENOUS
  Administered 2017-02-01 – 2017-02-02 (×4): via INTRAVENOUS

## 2017-01-28 MED ORDER — LEVOTHYROXINE SODIUM 25 MCG PO TABS
137.0000 ug | ORAL_TABLET | Freq: Every day | ORAL | Status: DC
  Administered 2017-01-29 – 2017-02-06 (×7): 137 ug via ORAL
  Filled 2017-01-28 (×7): qty 1

## 2017-01-28 MED ORDER — ALBUTEROL SULFATE (2.5 MG/3ML) 0.083% IN NEBU
3.0000 mL | INHALATION_SOLUTION | Freq: Four times a day (QID) | RESPIRATORY_TRACT | Status: DC
  Administered 2017-01-29 – 2017-01-30 (×6): 3 mL via RESPIRATORY_TRACT
  Filled 2017-01-28 (×6): qty 3

## 2017-01-28 MED ORDER — DOCUSATE SODIUM 100 MG PO CAPS
100.0000 mg | ORAL_CAPSULE | Freq: Two times a day (BID) | ORAL | Status: DC
  Administered 2017-01-29 – 2017-02-06 (×11): 100 mg via ORAL
  Filled 2017-01-28 (×13): qty 1

## 2017-01-28 MED ORDER — LORATADINE 10 MG PO TABS
10.0000 mg | ORAL_TABLET | Freq: Every day | ORAL | Status: DC
  Administered 2017-01-29 – 2017-02-06 (×6): 10 mg via ORAL
  Filled 2017-01-28 (×6): qty 1

## 2017-01-28 MED ORDER — PANTOPRAZOLE SODIUM 40 MG PO TBEC
40.0000 mg | DELAYED_RELEASE_TABLET | Freq: Every day | ORAL | Status: DC
  Administered 2017-01-29 – 2017-02-06 (×6): 40 mg via ORAL
  Filled 2017-01-28 (×6): qty 1

## 2017-01-28 MED ORDER — TRIAMCINOLONE 0.1 % CREAM:EUCERIN CREAM 1:1
TOPICAL_CREAM | Freq: Two times a day (BID) | CUTANEOUS | Status: DC
  Filled 2017-01-28: qty 1

## 2017-01-28 MED ORDER — HYDROCODONE-ACETAMINOPHEN 7.5-325 MG PO TABS
1.0000 | ORAL_TABLET | Freq: Three times a day (TID) | ORAL | Status: DC | PRN
  Administered 2017-01-30 – 2017-02-05 (×7): 1 via ORAL
  Filled 2017-01-28 (×7): qty 1

## 2017-01-28 MED ORDER — COLCHICINE 0.6 MG PO TABS
0.6000 mg | ORAL_TABLET | Freq: Two times a day (BID) | ORAL | Status: DC
  Administered 2017-01-28 – 2017-01-29 (×3): 0.6 mg via ORAL
  Filled 2017-01-28 (×3): qty 1

## 2017-01-28 MED ORDER — GABAPENTIN 300 MG PO CAPS
300.0000 mg | ORAL_CAPSULE | Freq: Two times a day (BID) | ORAL | Status: DC
  Administered 2017-01-28 – 2017-02-06 (×15): 300 mg via ORAL
  Filled 2017-01-28 (×16): qty 1

## 2017-01-28 MED ORDER — SODIUM CHLORIDE 0.9 % IV BOLUS (SEPSIS)
500.0000 mL | Freq: Once | INTRAVENOUS | Status: AC
  Administered 2017-01-28: 500 mL via INTRAVENOUS

## 2017-01-28 MED ORDER — ONDANSETRON HCL 4 MG PO TABS: 4.0000 mg | ORAL_TABLET | Freq: Four times a day (QID) | ORAL | Status: DC | PRN

## 2017-01-28 MED ORDER — INSULIN GLARGINE 100 UNIT/ML ~~LOC~~ SOLN
20.0000 [IU] | Freq: Every day | SUBCUTANEOUS | Status: DC
  Administered 2017-01-29 – 2017-01-31 (×3): 20 [IU] via SUBCUTANEOUS
  Filled 2017-01-28 (×7): qty 0.2

## 2017-01-28 MED ORDER — POLYVINYL ALCOHOL 1.4 % OP SOLN
1.0000 [drp] | Freq: Four times a day (QID) | OPHTHALMIC | Status: DC | PRN
  Filled 2017-01-28: qty 15

## 2017-01-28 MED ORDER — ONDANSETRON HCL 4 MG/2ML IJ SOLN
4.0000 mg | Freq: Once | INTRAMUSCULAR | Status: AC
  Administered 2017-01-28: 4 mg via INTRAVENOUS
  Filled 2017-01-28: qty 2

## 2017-01-28 MED ORDER — IOPAMIDOL (ISOVUE-300) INJECTION 61%
75.0000 mL | Freq: Once | INTRAVENOUS | Status: AC | PRN
  Administered 2017-01-28: 75 mL via INTRAVENOUS

## 2017-01-28 MED ORDER — FLUTICASONE FUROATE-VILANTEROL 200-25 MCG/INH IN AEPB
1.0000 | INHALATION_SPRAY | Freq: Every day | RESPIRATORY_TRACT | Status: DC
  Administered 2017-01-29 – 2017-02-06 (×8): 1 via RESPIRATORY_TRACT
  Filled 2017-01-28 (×2): qty 28

## 2017-01-28 MED ORDER — APIXABAN 5 MG PO TABS
5.0000 mg | ORAL_TABLET | Freq: Two times a day (BID) | ORAL | Status: DC
Start: 2017-01-28 — End: 2017-01-30
  Administered 2017-01-28 – 2017-01-29 (×3): 5 mg via ORAL
  Filled 2017-01-28 (×3): qty 1

## 2017-01-28 NOTE — ED Notes (Signed)
X ray notified that scan had not been performed yet.

## 2017-01-28 NOTE — ED Provider Notes (Addendum)
[facility] DEPT Provider Note   CSN: [id number] Arrival date & time: 01/28/17  1520     History   Chief Complaint Chief Complaint  Patient presents with  . Weakness    HPI Yolanda [name] is a 76 y.o. female.  Patient brought in by family was removed from rehabilitation facility in [place]. Patient is followed by Dr. Juanetta Gosling a discussed with Dr. Juanetta Gosling. I talked to Dr. Juanetta Gosling. Patient is to the be admitted here and then plan was to move on to the rehabilitation facility locally. However patient does have some redness to the left of leg consistent with cellulitis patients also had some altered mental status changes. Complaint abdominal pain some chronic diarrhea. Patient states she's very fatigue generalized weakness not thinking very clearly and has visual changes..      Past Medical History:  Diagnosis Date  . Barrett esophagus   . Bronchitis   . Cellulitis   . COPD (chronic obstructive pulmonary disease) (HCC)   . Diastolic heart failure (HCC)   . Essential hypertension   . GERD (gastroesophageal reflux disease)   . Hepatomegaly    ???  . Hypothyroidism   . PAF (paroxysmal atrial fibrillation) (HCC)    Eliquis  . Stage III chronic kidney disease     Patient Active Problem List   Diagnosis Date Noted  . Metabolic encephalopathy 01/01/2017  . Acute encephalopathy 12/30/2016  . Acute lower UTI 12/30/2016  . Pain, dental 12/30/2016  . Spasm of back muscles 12/30/2016  . Hyperglycemia 12/30/2016  . Gout attack 09/07/2016  . Chronic anticoagulation 02/02/2016  . Chronic edema 02/02/2016  . Obesity 02/02/2016  . Sleep apnea 02/02/2016  . RBBB 02/02/2016  . Acute on chronic diastolic heart failure (HCC) 01/13/2016  . Hyponatremia 01/06/2016  . AKI (acute kidney injury) (HCC) 01/06/2016  . Cellulitis of left lower extremity 12/15/2014  . PAF (paroxysmal atrial fibrillation) (HCC) 11/09/2014  . COPD (chronic obstructive pulmonary disease) (HCC) 11/06/2014  .  Neuropathic pain 11/06/2014  . Hypothyroidism 11/06/2014  . Hyperkalemia 11/06/2014  . HTN (hypertension) 01/12/2014  . Upper airway cough syndrome 09/03/2013  . GERD (gastroesophageal reflux disease) 11/13/2011  . Short-segment Barrett's esophagus 11/13/2011    Past Surgical History:  Procedure Laterality Date  . BREAST BIOPSY Right Sept, 2012  . CHOLECYSTECTOMY    . COLONOSCOPY    . UPPER GASTROINTESTINAL ENDOSCOPY      OB History    Gravida Para Term Preterm AB Living             0   SAB TAB Ectopic Multiple Live Births                   Home Medications    Prior to Admission medications   Medication Sig Start Date End Date Taking? Authorizing Provider  acetaminophen (TYLENOL) 325 MG tablet Take 2 tablets (650 mg total) by mouth every 6 (six) hours as needed for mild pain (or Fever >/= 101). 01/07/17  Yes Kari Baars, MD  albuterol (PROVENTIL HFA;VENTOLIN HFA) 108 (90 Base) MCG/ACT inhaler Inhale 2 puffs into the lungs 4 (four) times daily.    Yes [provider]  apixaban (ELIQUIS) 5 MG TABS tablet Take 1 tablet (5 mg total) by mouth 2 (two) times daily. 11/13/14  Yes Kari Baars, MD  calcium carbonate (TUMS - DOSED IN MG ELEMENTAL CALCIUM) 500 MG chewable tablet Chew 1 tablet by mouth 2 (two) times daily.   Yes [provider]  cetirizine (ZYRTEC)  10 MG tablet Take 10 mg by mouth daily.   Yes [provider]  cholecalciferol (VITAMIN D) 1000 units tablet Take 1,000 Units by mouth every morning.   Yes [provider]  cholecalciferol (VITAMIN D) 400 units TABS tablet Take 400 Units by mouth daily.   Yes [provider]  colchicine 0.6 MG tablet Take 0.6 mg by mouth 2 (two) times daily.   Yes [provider]  diltiazem (CARDIZEM CD) 120 MG 24 hr capsule Take 1 capsule (120 mg total) by mouth daily. 01/26/16  Yes Laqueta LindenKoneswaran, Suresh A, MD  fluticasone furoate-vilanterol (BREO ELLIPTA) 200-25 MCG/INH AEPB Inhale 1 puff  into the lungs daily.   Yes [provider]  gabapentin (NEURONTIN) 100 MG capsule Take 100 mg by mouth 2 (two) times daily.   Yes [provider]  HYDROcodone-acetaminophen (NORCO) 7.5-325 MG tablet Take 1 tablet by mouth 3 (three) times daily as needed for moderate pain.   Yes [provider]  insulin glargine (LANTUS) 100 UNIT/ML injection Inject 0.2 mLs (20 Units total) into the skin daily. 01/07/17  Yes Kari BaarsHawkins, Edward, MD  insulin lispro (HUMALOG) 100 UNIT/ML injection Inject 3-20 Units into the skin 3 (three) times daily before meals. 70-100= 0 units 101-150=3 units 151-200=4 units 201-250=8 units 251-300=12units 301-350=16units Greater than 350= 20 units   Yes [provider]  levothyroxine (SYNTHROID, LEVOTHROID) 137 MCG tablet Take 137 mcg by mouth daily before breakfast.   Yes [provider]  Magnesium 250 MG TABS Take 2 tablets by mouth 2 (two) times daily.    Yes [provider]  metoprolol tartrate (LOPRESSOR) 25 MG tablet TAKE 1/2 TABLET BY MOUTH TWICE DAILY 10/23/16  Yes Laqueta LindenKoneswaran, Suresh A, MD  Multiple Vitamin (MULTIVITAMIN WITH MINERALS) TABS tablet Take 1 tablet by mouth daily.   Yes [provider]  omeprazole (PRILOSEC) 20 MG capsule TAKE ONE CAPSULE BY MOUTH DAILY 05/29/16  Yes Rehman, Joline MaxcyNajeeb U, MD  ondansetron (ZOFRAN) 4 MG tablet Take 1 tablet (4 mg total) by mouth every 6 (six) hours as needed for nausea. 01/07/17  Yes Kari BaarsHawkins, Edward, MD  potassium chloride SA (K-DUR,KLOR-CON) 20 MEQ tablet Take 1 tablet (20 mEq total) by mouth daily. 12/10/16  Yes BranchDorothe Pea, Jonathan F, MD  torsemide (DEMADEX) 20 MG tablet Take 1 tablet (20 mg total) by mouth 2 (two) times daily. (may take an extra 20mg  as needed) 01/07/17  Yes Kari BaarsHawkins, Edward, MD  triamcinolone (NASACORT ALLERGY 24HR) 55 MCG/ACT AERO nasal inhaler Place 2 sprays into the nose daily as needed (for allergies).    Yes [provider]  ALPRAZolam (XANAX) 0.25  MG tablet Take 1 tablet (0.25 mg total) by mouth 2 (two) times daily. Patient not taking: Reported on 01/28/2017 01/07/17   Kari BaarsHawkins, Edward, MD  feeding supplement, GLUCERNA SHAKE, (GLUCERNA SHAKE) LIQD Take 237 mLs by mouth 3 (three) times daily between meals. 01/07/17   Kari BaarsHawkins, Edward, MD  gabapentin (NEURONTIN) 300 MG capsule Take 1 capsule (300 mg total) by mouth 2 (two) times daily. Patient not taking: Reported on 01/28/2017 01/07/17   Kari BaarsHawkins, Edward, MD  HYDROcodone-acetaminophen (NORCO/VICODIN) 5-325 MG tablet Take 1 tablet by mouth every 4 (four) hours as needed for moderate pain or severe pain. Patient not taking: Reported on 01/28/2017 12/27/16   Lavera GuiseLiu, Dana Duo, MD  insulin aspart (NOVOLOG) 100 UNIT/ML injection Inject 0-20 Units into the skin 3 (three) times daily with meals. Patient not taking: Reported on 01/28/2017 01/07/17   Kari BaarsHawkins, Edward, MD  polyvinyl alcohol (LIQUIFILM TEARS) 1.4 % ophthalmic solution Place 1 drop into both eyes 4 (four) times daily as needed for dry eyes. 01/07/17   Kari Baars, MD  predniSONE (STERAPRED UNI-PAK 21 TAB) 10 MG (21) TBPK tablet Take by package directions Patient not taking: Reported on 01/28/2017 01/07/17   Kari Baars, MD    Family History Family History  Problem Relation Age of Onset  . Dementia Mother   . Lung cancer Father     smoked  . Hypertension Sister   . Diabetes Brother   . Obesity Sister   . Hypertension Sister   . Restless legs syndrome Sister   . Healthy Sister   . Lung cancer Maternal Uncle     Social History Social History  Substance Use Topics  . Smoking status: Former Smoker    Packs/day: 0.50    Years: 30.00    Types: Cigarettes    Start date: 09/24/1958    Quit date: 11/13/1999  . Smokeless tobacco: Never Used  . Alcohol use No     Allergies   Ciprofloxacin; Doxycycline; Fish oil; Guaifenesin; Mucinex [guaifenesin er]; Sulfamethoxazole w/trimethoprim 800-160 [sulfamethoxazole-trimethoprim]; Uloric [febuxostat];  Adhesive [tape]; Allopurinol; Cefuroxime axetil; Metolazone; and Tramadol   Review of Systems Review of Systems  Unable to perform ROS: Mental status change  Constitutional: Positive for fatigue and fever.  HENT: Negative for congestion.   Eyes: Positive for visual disturbance.  Respiratory: Positive for shortness of breath.   Cardiovascular: Negative for chest pain.  Gastrointestinal: Positive for abdominal pain, diarrhea, nausea and vomiting.  Genitourinary: Negative for dysuria.  Musculoskeletal: Positive for back pain.  Skin: Positive for rash.  Neurological: Positive for weakness. Negative for headaches.  Hematological: Bruises/bleeds easily.  Psychiatric/Behavioral: Positive for confusion.     Physical Exam Updated Vital Signs BP (!) 102/52   Pulse 89   Temp 98.9 F (37.2 C) (Oral)   Resp 15   Ht 5\' 2"  (1.575 m)   Wt 95.3 kg   SpO2 98%   BMI 38.41 kg/m   Physical Exam  Constitutional: She appears well-developed and well-nourished.  HENT:  Head: Normocephalic and atraumatic.  Eyes: Pupils are equal, round, and reactive to light. EOM are normal.  Neck: Neck supple.  Cardiovascular: Normal rate and regular rhythm.   Pulmonary/Chest: Effort normal and breath sounds normal. She has no wheezes.  Abdominal: Soft. Bowel sounds are normal. There is no tenderness.  Musculoskeletal: Normal range of motion.  Neurological: She is alert.  Some generalized weakness.  Skin: Skin is warm.  Nursing note and vitals reviewed.    ED Treatments / Results  Labs (all labs ordered are listed, but only abnormal results are displayed) Labs Reviewed  C DIFFICILE QUICK SCREEN W PCR REFLEX - Abnormal; Notable for the following:       Result Value   C Diff antigen POSITIVE (*)    All other components within normal limits  CBC WITH DIFFERENTIAL/PLATELET - Abnormal; Notable for the following:    RBC 3.07 (*)    Hemoglobin 9.7 (*)    HCT 27.9 (*)    All other components within  normal limits  COMPREHENSIVE METABOLIC PANEL - Abnormal; Notable for the following:    Sodium 115 (*)    Chloride 77 (*)    Glucose, Bld 112 (*)    Creatinine, Ser 1.27 (*)    Calcium 8.4 (*)    Total Protein 5.5 (*)    Albumin 2.3 (*)    AST 49 (*)  Alkaline Phosphatase 206 (*)    Total Bilirubin 2.6 (*)    GFR calc non Af Amer 41 (*)    GFR calc Af Amer 47 (*)    All other components within normal limits  URINALYSIS, ROUTINE W REFLEX MICROSCOPIC - Abnormal; Notable for the following:    APPearance TURBID (*)    Specific Gravity, Urine 1.004 (*)    Hgb urine dipstick MODERATE (*)    Protein, ur 100 (*)    Leukocytes, UA MODERATE (*)    Bacteria, UA RARE (*)    Squamous Epithelial / LPF 0-5 (*)    Non Squamous Epithelial 0-5 (*)    All other components within normal limits  TSH - Abnormal; Notable for the following:    TSH 5.186 (*)    All other components within normal limits  CLOSTRIDIUM DIFFICILE BY PCR  LIPASE, BLOOD  LACTIC ACID, PLASMA  BRAIN NATRIURETIC PEPTIDE  TROPONIN I    EKG  EKG Interpretation  Date/Time:  Monday Jan 28 2017 16:46:54 EDT Ventricular Rate:  85 PR Interval:    QRS Duration: 122 QT Interval:  378 QTC Calculation: 450 R Axis:   -28 Text Interpretation:  Sinus rhythm Right bundle branch block No significant change since last tracing Confirmed by Jenesis Suchy  MD, Izamar Linden [phone]) on 01/28/2017 4:51:43 PM       Radiology Dg Chest 1 View  Result Date: 01/28/2017 CLINICAL DATA:  Acute onset of shortness of breath. Initial encounter. EXAM: CHEST 1 VIEW COMPARISON:  Chest radiograph performed 12/30/2016 FINDINGS: The lungs are well-aerated. Minimal bibasilar atelectasis is noted. There is no evidence of pleural effusion or pneumothorax. The cardiomediastinal silhouette is mildly enlarged. No acute osseous abnormalities are seen. IMPRESSION: Minimal bibasilar atelectasis noted.  Lungs otherwise clear. Electronically Signed   By: Roanna Raider M.D.    On: 01/28/2017 19:36   Ct Head Wo Contrast  Result Date: 01/28/2017 CLINICAL DATA:  Weakness.  Nausea, loose stools and diarrhea. EXAM: CT HEAD WITHOUT CONTRAST TECHNIQUE: Contiguous axial images were obtained from the base of the skull through the vertex without intravenous contrast. COMPARISON:  Head CT 12/31/2016 FINDINGS: Brain: No evidence of acute infarction, hemorrhage, hydrocephalus, extra-axial collection or mass lesion/mass effect. Mild chronic small vessel ischemia is stable from prior exam. Vascular: Atherosclerosis of skullbase vasculature without hyperdense vessel or abnormal calcification. Skull: Normal. Negative for fracture or focal lesion. Sinuses/Orbits: Paranasal sinuses and mastoid air cells are clear. The visualized orbits are unchanged with mild bilateral exophthalmos. Other: None. IMPRESSION: No acute intracranial abnormality. Electronically Signed   By: Rubye Oaks M.D.   On: 01/28/2017 19:51   Ct Abdomen Pelvis W Contrast  Result Date: 01/28/2017 CLINICAL DATA:  Shortness of breath, weakness, nausea and diarrhea EXAM: CT ABDOMEN AND PELVIS WITH CONTRAST TECHNIQUE: Multidetector CT imaging of the abdomen and pelvis was performed using the standard protocol following bolus administration of intravenous contrast. CONTRAST:  75mL ISOVUE-300 IOPAMIDOL (ISOVUE-300) INJECTION 61% COMPARISON:  02/02/2015 FINDINGS: Lower chest: Mild cardiomegaly. No pericardial or pleural effusion. Bibasilar atelectasis. Atherosclerosis of the descending thoracic aorta. Hepatobiliary: Mild hypoattenuation of the liver parenchyma compatible with hepatic steatosis or fatty infiltration. No focal hepatic abnormality or biliary dilatation. Patent hepatic and portal veins. Remote cholecystectomy noted. Pancreas: Unremarkable. No pancreatic ductal dilatation or surrounding inflammatory changes. Spleen: Normal in size without focal abnormality. Adrenals/Urinary Tract: Adrenal glands are unremarkable. Kidneys are  normal, without renal calculi, focal lesion, or hydronephrosis. Bladder is unremarkable. Stomach/Bowel: Negative for bowel obstruction, significant  dilatation, ileus, or free air. No fluid collection or abscess. Remote appendectomy noted. Colon is collapsed. Minor scattered diverticulosis. Vascular/Lymphatic: Aortic atherosclerosis noted without aneurysm or dissection. No occlusive process. No retroperitoneal hemorrhage. No adenopathy. Reproductive: Uterus and bilateral adnexa are unremarkable. Other: No abdominal wall hernia or abnormality. No abdominopelvic ascites. Musculoskeletal: Diffuse lumbar spondylosis and facet arthropathy. No acute osseous finding or compression fracture. IMPRESSION: No acute intra-abdominal or pelvic finding by CT. Remote cholecystectomy and appendectomy. Hepatic steatosis Abdominal atherosclerosis Diverticulosis. Electronically Signed   By: Judie Petit.  Shick M.D.   On: 01/28/2017 19:57    Procedures Procedures (including critical care time)  Medications Ordered in ED Medications  0.9 %  sodium chloride infusion ( Intravenous New Bag/Given 01/28/17 1811)  sodium chloride 0.9 % bolus 500 mL (0 mLs Intravenous Stopped 01/28/17 1800)  ondansetron (ZOFRAN) injection 4 mg (4 mg Intravenous Given 01/28/17 1645)  vancomycin (VANCOCIN) IVPB 1000 mg/200 mL premix (0 mg Intravenous Stopped 01/28/17 1800)  iopamidol (ISOVUE-300) 61 % injection 75 mL (75 mLs Intravenous Contrast Given 01/28/17 1923)     Initial Impression / Assessment and Plan / ED Course  I have reviewed the triage vital signs and the nursing notes.  Pertinent labs & imaging results that were available during my care of the patient were reviewed by me and considered in my medical decision making (see chart for details).     Patient referred here by Dr. Juanetta Gosling. Patient had an admission back in the first part of April has been at rehabilitation facility for about 3 weeks in the [place] area. Family not happy with care they have  pulled her out. States that patient's been getting weaker and worse.  Today's workup does show some significant changes from her discharge from here. Patient has marked hyponatremia with a sodium of 115. Significant liver function abnormalities. But CT scan of the abdomen without any acute findings.   Patient's thyroid function as far as TSH suggestive maybe some hypothyroidism.  Patients also had prolonged the diarrhea. C. difficile tests sent in part of its positive but needs confirmation.  Patient with cellulitis to her left leg anteriorly treated with IV vancomycin here.  For the hyponatremia which is critical patient's been receiving of IV normal saline.  We'll contact hospitalist for admission. Had previously discussed the patient with Dr. Juanetta Gosling and he definitely wanted her admitted but based on the critical value the sodium there is no question she needs to be admitted.  In addition patient has a history of COPD no wheezing here chest x-ray negative. Patient the with oxygen saturations in the low 90s occasionally slightly below on room air. Patient currently is not on oxygen at home.   Head CT without any acute findings patient had complaint of fatigue weakness not being very alert and some visual changes. These all may be secondary to the hyponatremia. And patient is on a diuretic that also could be contributing to the hyponatremia.  Patient is on blood thinners probably secondary to a history of atrial fibrillation. Patient is on of the diuretic Descemet has a history of congestive heart failure. Chest x-ray here today negative. BMP without significant elevation. Troponin negative lactic acid without significant elevation patient also does have a history of hypothyroidism. Supposed to be on Synthroid.  Final Clinical Impressions(s) / ED Diagnoses   Final diagnoses:  Hyponatremia  Cellulitis of left lower extremity  Other specified hypothyroidism  Diarrhea, unspecified type     New Prescriptions New Prescriptions   No medications  on file     Vanetta Mulders, MD 01/28/17 2038    Vanetta Mulders, MD 01/28/17 1610    Vanetta Mulders, MD 04/27/17 [phone]

## 2017-01-28 NOTE — ED Notes (Signed)
Date and time results received: 01/28/17 2201 (use smartphrase ".now" to insert current time)  Test: Sodium Critical Value: 116  Name of Provider Notified: Zackowski  Orders Received? Or Actions Taken?: reported to EDP- previous level was 115, being admitted

## 2017-01-28 NOTE — H&P (Addendum)
History and Physical    Yolanda [name] [id number]:[id number] DOB: 1941/02/17 DOA: 01/28/2017  PCP: Alonza Bogus, MD Consultants:  Karie Mainland - dentist; Bronson Ing - cardiology; Luna Glasgow - orthopedics; Rehman - GI; Detterding - nephrology (no longer seeing him) Patient coming from: home - lives alone; NOK: sister, Barbaraann Share, [phone]  Chief Complaint: weakness  HPI: Yolanda [name] is a 76 y.o. female with medical history significant of Afib on Eliquis, Stage 3 CKD, hypothyroidism, GERD, HTN, diastolic heart failure, and COPD.  She was hospitalized here from 4/8-16 (admitted by me) for acute encephalopathy thought to be related to UTI and cellulitis of the leg; she was discharged to [facility].  While there, "it's been a long 3 weeks".  She reports laying in one spot for days.  She was still having extreme pain that she had while here, so they were unable  to start rehab.  They gave her hydrocodone to the point that she had tremors and was out of it.  She diagnosed herself with gouty attack; sister went to see Dr. Sherrie Sport and he prescribed a new medication.  They were 2 weeks into the rehab by the time she was better.  This week, she was able to assist herself in the shower.  She has only taken a few steps in the whole month.  This weekend, she had projectile vomiting and diarrhea.  Vision was increasingly blurred, difficulty focusing.  Xanax at home was previously prn insomnia and she rarely took it, but she has been getting it BID at rehab.  Xanax was discontinued 2 days ago.  Vision is slightly better but not good.  Ongoing diarrhea, no further vomiting.  She went to an appointment with Dr. Luan Pulling today and he suggested that they go back and sign her out of [place] and return for admission to [facility].  ED Course:   Marked hyponatremia, Na++ 115 - given IV NS; bili and alk phos increased with normal CT abdomen; TSH slightly elevated; prolonged diarrhea with negative C diff (toxin, Ag was positive);  cellulitis to left leg anteriorly treated with IV vancomycin  Review of Systems: Unable stand by herself, can't pull herself up.  Slight headaches.  Looks like the worsd are moving when she looks at them.  Always SOB, unchanged.  Has COPD, ocacsional cough, unchanged.  A few sore areas with palpation on abdomen.  Cellulitis appears to be back, maybe recurrence of gout.  Otherwise ROS complete and negative.  Ambulatory Status:  Usually ambulates without assistance  Past Medical History:  Diagnosis Date  . Barrett esophagus   . Bronchitis   . Cellulitis   . COPD (chronic obstructive pulmonary disease) ([place])   . Diastolic heart failure ([place])   . Essential hypertension   . GERD (gastroesophageal reflux disease)   . Hepatomegaly    ???  . Hypothyroidism   . PAF (paroxysmal atrial fibrillation) (HCC)    Eliquis  . Stage III chronic kidney disease     Past Surgical History:  Procedure Laterality Date  . BREAST BIOPSY Right Sept, 2012  . CHOLECYSTECTOMY    . COLONOSCOPY    . UPPER GASTROINTESTINAL ENDOSCOPY      Social History   Social History  . Marital status: Widowed    Spouse name: N/A  . Number of children: N/A  . Years of education: N/A   Occupational History  . Not on file.   Social History Main Topics  . Smoking status: Former Smoker    Packs/day: 0.50  Years: 30.00    Types: Cigarettes    Start date: 09/24/1958    Quit date: 11/13/1999  . Smokeless tobacco: Never Used  . Alcohol use No  . Drug use: No  . Sexual activity: Not Currently   Other Topics Concern  . Not on file   Social History Narrative  . No narrative on file    Allergies  Allergen Reactions  . Ciprofloxacin Shortness Of Breath    REACTION: sob,tachycardia  . Doxycycline Nausea Only    Also experienced diarrhea   . Fish Oil     Patient face drew to the side,Bells [place]y  . Guaifenesin Shortness Of Breath    REACTION: sob,tachycardia  . Mucinex [Guaifenesin Er] Shortness Of Breath    . Sulfamethoxazole W/Trimethoprim 800-160 [Sulfamethoxazole-Trimethoprim] Nausea Only    Also lack of appetite   . Uloric [Febuxostat] Swelling    No urination  . Adhesive [Tape] Other (See Comments)    Causes blisters on skin  . Allopurinol Other (See Comments)    Couldn't urinate  . Cefuroxime Axetil Swelling    Swelling all over body-per patient she was hospitalized as a result of taking this medication  . Metolazone Nausea Only    Swelling   . Tramadol     insomnia    Family History  Problem Relation Age of Onset  . Dementia Mother   . Lung cancer Father     smoked  . Hypertension Sister   . Diabetes Brother   . Obesity Sister   . Hypertension Sister   . Restless legs syndrome Sister   . Healthy Sister   . Lung cancer Maternal Uncle     Prior to Admission medications   Medication Sig Start Date End Date Taking? Authorizing Provider  acetaminophen (TYLENOL) 325 MG tablet Take 2 tablets (650 mg total) by mouth every 6 (six) hours as needed for mild pain (or Fever >/= 101). 01/07/17  Yes Sinda Du, MD  albuterol (PROVENTIL HFA;VENTOLIN HFA) 108 (90 Base) MCG/ACT inhaler Inhale 2 puffs into the lungs 4 (four) times daily.    Yes [provider]  apixaban (ELIQUIS) 5 MG TABS tablet Take 1 tablet (5 mg total) by mouth 2 (two) times daily. 11/13/14  Yes Sinda Du, MD  calcium carbonate (TUMS - DOSED IN MG ELEMENTAL CALCIUM) 500 MG chewable tablet Chew 1 tablet by mouth 2 (two) times daily.   Yes [provider]  cetirizine (ZYRTEC) 10 MG tablet Take 10 mg by mouth daily.   Yes [provider]  cholecalciferol (VITAMIN D) 1000 units tablet Take 1,000 Units by mouth every morning.   Yes [provider]  cholecalciferol (VITAMIN D) 400 units TABS tablet Take 400 Units by mouth daily.   Yes [provider]  colchicine 0.6 MG tablet Take 0.6 mg by mouth 2 (two) times daily.   Yes [provider]  diltiazem (CARDIZEM CD)  120 MG 24 hr capsule Take 1 capsule (120 mg total) by mouth daily. 01/26/16  Yes Herminio Commons, MD  fluticasone furoate-vilanterol (BREO ELLIPTA) 200-25 MCG/INH AEPB Inhale 1 puff into the lungs daily.   Yes [provider]  gabapentin (NEURONTIN) 100 MG capsule Take 100 mg by mouth 2 (two) times daily.   Yes [provider]  HYDROcodone-acetaminophen (NORCO) 7.5-325 MG tablet Take 1 tablet by mouth 3 (three) times daily as needed for moderate pain.   Yes [provider]  insulin glargine (LANTUS) 100 UNIT/ML injection Inject 0.2 mLs (20  Units total) into the skin daily. 01/07/17  Yes Sinda Du, MD  insulin lispro (HUMALOG) 100 UNIT/ML injection Inject 3-20 Units into the skin 3 (three) times daily before meals. 70-100= 0 units 101-150=3 units 151-200=4 units 201-250=8 units 251-300=12units 301-350=16units Greater than 350= 20 units   Yes [provider]  levothyroxine (SYNTHROID, LEVOTHROID) 137 MCG tablet Take 137 mcg by mouth daily before breakfast.   Yes [provider]  Magnesium 250 MG TABS Take 2 tablets by mouth 2 (two) times daily.    Yes [provider]  metoprolol tartrate (LOPRESSOR) 25 MG tablet TAKE 1/2 TABLET BY MOUTH TWICE DAILY 10/23/16  Yes Herminio Commons, MD  Multiple Vitamin (MULTIVITAMIN WITH MINERALS) TABS tablet Take 1 tablet by mouth daily.   Yes [provider]  omeprazole (PRILOSEC) 20 MG capsule TAKE ONE CAPSULE BY MOUTH DAILY 05/29/16  Yes Rehman, Mechele Dawley, MD  ondansetron (ZOFRAN) 4 MG tablet Take 1 tablet (4 mg total) by mouth every 6 (six) hours as needed for nausea. 01/07/17  Yes Sinda Du, MD  potassium chloride SA (K-DUR,KLOR-CON) 20 MEQ tablet Take 1 tablet (20 mEq total) by mouth daily. 12/10/16  Yes BranchAlphonse Guild, MD  torsemide (DEMADEX) 20 MG tablet Take 1 tablet (20 mg total) by mouth 2 (two) times daily. (may take an extra 81m as needed) 01/07/17  Yes HSinda Du MD    triamcinolone (NASACORT ALLERGY 24HR) 55 MCG/ACT AERO nasal inhaler Place 2 sprays into the nose daily as needed (for allergies).    Yes [provider]  ALPRAZolam (XANAX) 0.25 MG tablet Take 1 tablet (0.25 mg total) by mouth 2 (two) times daily. Patient not taking: Reported on 01/28/2017 01/07/17   HSinda Du MD  feeding supplement, GLUCERNA SHAKE, (GLUCERNA SHAKE) LIQD Take 237 mLs by mouth 3 (three) times daily between meals. 01/07/17   HSinda Du MD  gabapentin (NEURONTIN) 300 MG capsule Take 1 capsule (300 mg total) by mouth 2 (two) times daily. Patient not taking: Reported on 01/28/2017 01/07/17   HSinda Du MD  HYDROcodone-acetaminophen (NORCO/VICODIN) 5-325 MG tablet Take 1 tablet by mouth every 4 (four) hours as needed for moderate pain or severe pain. Patient not taking: Reported on 01/28/2017 12/27/16   LForde Dandy MD  insulin aspart (NOVOLOG) 100 UNIT/ML injection Inject 0-20 Units into the skin 3 (three) times daily with meals. Patient not taking: Reported on 01/28/2017 01/07/17   HSinda Du MD  polyvinyl alcohol (LIQUIFILM TEARS) 1.4 % ophthalmic solution Place 1 drop into both eyes 4 (four) times daily as needed for dry eyes. 01/07/17   HSinda Du MD  predniSONE (STERAPRED UNI-PAK 21 TAB) 10 MG (21) TBPK tablet Take by package directions Patient not taking: Reported on 01/28/2017 01/07/17   HSinda Du MD    Physical Exam: Vitals:   01/28/17 1745 01/28/17 1830 01/28/17 1900 01/28/17 1915  BP:  (!) 107/48 (!) 102/52   Pulse: 85   89  Resp: _0 Temp:      TempSrc:      SpO2: 92%   98%  Weight:      Height:         General:  Appears calm and comfortable and is NAD but fatigued Eyes:  PERRL, EOMI, normal lids, iris ENT:  grossly normal hearing, lips & tongue, mmm Neck:  no LAD, masses or thyromegaly Cardiovascular:  RRR, no m/r/g. No LE edema.  Respiratory:  CTA bilaterally, no w/r/r. Normal respiratory effort. Abdomen:  soft, ntnd,  NABS Skin:  8 cm area of mild erythema on the left anterior lower leg which may be c/w mild cellulitis although she also has changes concerning for venous stasis Musculoskeletal:  grossly normal tone BUE/BLE, good ROM, no bony abnormality Psychiatric:  grossly normal mood and affect, speech fluent and appropriate, AOx3 Neurologic:  CN 2-12 grossly intact, moves all extremities in coordinated fashion, sensation intact  Labs on Admission: I have personally reviewed following labs and imaging studies  CBC:  Recent Labs Lab 01/28/17 1717  WBC 5.6  NEUTROABS 3.8  HGB 9.7*  HCT 27.9*  MCV 90.9  PLT 710   Basic Metabolic Panel:  Recent Labs Lab 01/28/17 1717  NA 115*  K 4.4  CL 77*  CO2 29  GLUCOSE 112*  BUN 18  CREATININE 1.27*  CALCIUM 8.4*   GFR: Estimated Creatinine Clearance: 42.5 mL/min (A) (by C-G formula based on SCr of 1.27 mg/dL (H)). Liver Function Tests:  Recent Labs Lab 01/28/17 1717  AST 49*  ALT 53  ALKPHOS 206*  BILITOT 2.6*  PROT 5.5*  ALBUMIN 2.3*    Recent Labs Lab 01/28/17 1717  LIPASE 12   No results for input(s): AMMONIA in the last 168 hours. Coagulation Profile: No results for input(s): INR, PROTIME in the last 168 hours. Cardiac Enzymes:  Recent Labs Lab 01/28/17 1717  TROPONINI <0.03   BNP (last 3 results) No results for input(s): PROBNP in the last 8760 hours. HbA1C: No results for input(s): HGBA1C in the last 72 hours. CBG: No results for input(s): GLUCAP in the last 168 hours. Lipid Profile: No results for input(s): CHOL, HDL, LDLCALC, TRIG, CHOLHDL, LDLDIRECT in the last 72 hours. Thyroid Function Tests:  Recent Labs  01/28/17 1717  TSH 5.186*   Anemia Panel: No results for input(s): VITAMINB12, FOLATE, FERRITIN, TIBC, IRON, RETICCTPCT in the last 72 hours. Urine analysis:    Component Value Date/Time   COLORURINE YELLOW 01/28/2017 1626   APPEARANCEUR TURBID (A) 01/28/2017 1626   LABSPEC 1.004 (L) 01/28/2017  1626   PHURINE 5.0 01/28/2017 1626   GLUCOSEU NEGATIVE 01/28/2017 1626   HGBUR MODERATE (A) 01/28/2017 1626   BILIRUBINUR NEGATIVE 01/28/2017 1626   KETONESUR NEGATIVE 01/28/2017 1626   PROTEINUR 100 (A) 01/28/2017 1626   NITRITE NEGATIVE 01/28/2017 1626   LEUKOCYTESUR MODERATE (A) 01/28/2017 1626    Creatinine Clearance: Estimated Creatinine Clearance: 42.5 mL/min (A) (by C-G formula based on SCr of 1.27 mg/dL (H)).  Sepsis Labs: _0 (procalcitonin:4,lacticidven:4) ) Recent Results (from the past 240 hour(s))  C difficile quick scan w PCR reflex     Status: Abnormal   Collection Time: 01/28/17  4:53 PM  Result Value Ref Range Status   C Diff antigen POSITIVE (A) NEGATIVE Final   C Diff toxin NEGATIVE NEGATIVE Final   C Diff interpretation Results are indeterminate. See PCR results.  Final     Radiological Exams on Admission: Dg Chest 1 View  Result Date: 01/28/2017 CLINICAL DATA:  Acute onset of shortness of breath. Initial encounter. EXAM: CHEST 1 VIEW COMPARISON:  Chest radiograph performed 12/30/2016 FINDINGS: The lungs are well-aerated. Minimal bibasilar atelectasis is noted. There is no evidence of pleural effusion or pneumothorax. The cardiomediastinal silhouette is mildly enlarged. No acute osseous abnormalities are seen. IMPRESSION: Minimal bibasilar atelectasis noted.  Lungs otherwise clear. Electronically Signed   By: Garald Balding M.D.   On: 01/28/2017 19:36   Ct Head Wo Contrast  Result Date: 01/28/2017 CLINICAL DATA:  Weakness.  Nausea, loose stools and diarrhea. EXAM: CT HEAD WITHOUT CONTRAST TECHNIQUE: Contiguous axial images were obtained from the base of the skull through the vertex without intravenous contrast. COMPARISON:  Head CT 12/31/2016 FINDINGS: Brain: No evidence of acute infarction, hemorrhage, hydrocephalus, extra-axial collection or mass lesion/mass effect. Mild chronic small vessel ischemia is stable from prior exam. Vascular: Atherosclerosis of  skullbase vasculature without hyperdense vessel or abnormal calcification. Skull: Normal. Negative for fracture or focal lesion. Sinuses/Orbits: Paranasal sinuses and mastoid air cells are clear. The visualized orbits are unchanged with mild bilateral exophthalmos. Other: None. IMPRESSION: No acute intracranial abnormality. Electronically Signed   By: Jeb Levering M.D.   On: 01/28/2017 19:51   Ct Abdomen Pelvis W Contrast  Result Date: 01/28/2017 CLINICAL DATA:  Shortness of breath, weakness, nausea and diarrhea EXAM: CT ABDOMEN AND PELVIS WITH CONTRAST TECHNIQUE: Multidetector CT imaging of the abdomen and pelvis was performed using the standard protocol following bolus administration of intravenous contrast. CONTRAST:  63m ISOVUE-300 IOPAMIDOL (ISOVUE-300) INJECTION 61% COMPARISON:  02/02/2015 FINDINGS: Lower chest: Mild cardiomegaly. No pericardial or pleural effusion. Bibasilar atelectasis. Atherosclerosis of the descending thoracic aorta. Hepatobiliary: Mild hypoattenuation of the liver parenchyma compatible with hepatic steatosis or fatty infiltration. No focal hepatic abnormality or biliary dilatation. Patent hepatic and portal veins. Remote cholecystectomy noted. Pancreas: Unremarkable. No pancreatic ductal dilatation or surrounding inflammatory changes. Spleen: Normal in size without focal abnormality. Adrenals/Urinary Tract: Adrenal glands are unremarkable. Kidneys are normal, without renal calculi, focal lesion, or hydronephrosis. Bladder is unremarkable. Stomach/Bowel: Negative for bowel obstruction, significant dilatation, ileus, or free air. No fluid collection or abscess. Remote appendectomy noted. Colon is collapsed. Minor scattered diverticulosis. Vascular/Lymphatic: Aortic atherosclerosis noted without aneurysm or dissection. No occlusive process. No retroperitoneal hemorrhage. No adenopathy. Reproductive: Uterus and bilateral adnexa are unremarkable. Other: No abdominal wall hernia or  abnormality. No abdominopelvic ascites. Musculoskeletal: Diffuse lumbar spondylosis and facet arthropathy. No acute osseous finding or compression fracture. IMPRESSION: No acute intra-abdominal or pelvic finding by CT. Remote cholecystectomy and appendectomy. Hepatic steatosis Abdominal atherosclerosis Diverticulosis. Electronically Signed   By: MJerilynn Mages  Shick M.D.   On: 01/28/2017 19:57    EKG: Independently reviewed.  NSR with rate 85;RBBB with no evidence of acute ischemia, NSCSLT  Assessment/Plan Principal Problem:   Hyponatremia Active Problems:   HTN (hypertension)   COPD (chronic obstructive pulmonary disease) ([facility])   Neuropathic pain   Hypothyroidism   PAF (paroxysmal atrial fibrillation) ([facility])   Cellulitis of left lower extremity   Chronic anticoagulation   Sleep apnea   Gout attack   Hyperglycemia   Elevated LFTs   Diabetes mellitus type 2 in obese (HCC)   Hyponatremia -Etiology appears to be euvolemic hyponatremia; while the patient does have apparent malnutrition (Albumin 2.3, 3.5 on 4/8; nutrition consult ordered), her creatinine is stable (1.27, GFR 41, stable), she is not tachycardic, and she does not have other physical exam evidence of apparent dehydration -Na 115, Cl 77; prior 136-138 -Given the dramatic decrease from normal during her hospitalization a few weeks ago to grossly abnormal now, will confirm BMP STAT with the lab (confirmed) -This likely evolved as a result of excessive oral fluid intake, diuretic use, and recent prolonged diarrhea -Will check urinary and serum Osm as well as urinary sodium and potassium levels -Because the patient is basically asymptomatic other than generalized weakness and blurred vision, it is unlikely that this extent of hyponatremia developed overnight; instead this is likely chronic in nature -Hold diuretics -Continue NS at 75 cc/hr  until sodium >120 -Once Na >120, then treat with fluid restriction of 1200 mL/day -This should be  adjusted based on UOP with goal fluid intake of 500 cc less per day than her total urinary output -Will monitor sodium q4h to attempt to avoid overcorrection (>53mq/L/day) so as to avoid central pontine myelinolysis -If fluid restriction is unsuccessful, would need to consider vaptan therapy  Cellulitis  -LLE cellulitis appears to be present on exam -She has a h/o this in the past -It appears to be quite mild and could actually be related to stasis dermatitis  -Received 1 dose of Vancomycin in the ER -Will treat with PO Clindamycin, which she has taken without difficulty multiple times in the past  PAF on anticoagulation -CHA2DS2-VASc score is 4 (grade 2 diastolic dysfunction, HTN, female, age) with an estimated stroke rate of 4%/year -Continue Eliquis -Rate controlled with Cardizem and Metoprolol, will continue  Hypothyroidism -TSH 5.186 -Check free T4 -Continue Synthroid at current dose for now but may need a small dose increase  Diabetes mellitus -Glucose 112 -Prior A1c was 6.9 in 2/16 -Recheck A1c -Continue Lantus -Cover with SSI  Elevated LFTs -Alk Phos 206, 70 on 4/8 -AST 49/ALT 53; 34/40 on 4/8 -Total bili 2.6; 1.1 on 4/6 -Hepatic steatosis appreciated on CT today -May be related to recent diarrhea -Will check GI pathogen panel -C. Diff toxin negative (despite antigen positive) -Monitor stools, consider outpatient GI evaluation unless this acutely worsens  Chronic respiratory failure -Appears to be stable at this time -Sats of 88-92% appropriate given h/o COPD -No current COPD exacerbation -Will try autopap for OSA  Gout -Uric acid 11.8 on 4/10 -Recheck uric acid -Patient reports new medication for this issue, possibly colchicine -She does not appear to be taking Allopurinol, which is better for chronic gout suppression -Would recommend starting and then titrating Allopurinol for uric acid <7  Neuropathic pain -Resume Neurontin at patient  request     DVT prophylaxis: Eliquis Code Status:  Full - confirmed with patient/family Family Communication: Sister present throughout evaluation  Disposition Plan:  To be determined Consults called: None  Admission status: Admit - It is my clinical opinion that admission to INPATIENT is reasonable and necessary because this patient will require at least 2 midnights in the hospital to treat this condition based on the medical complexity of the problems presented.  Given the aforementioned information, the predictability of an adverse outcome is felt to be significant.  Total critical care time: 65 minutes Critical care time was exclusive of separately billable procedures and treating other patients. Critical care was necessary to treat or prevent imminent or life-threatening deterioration. Critical care was time spent personally by me on the following activities: development of treatment plan with patient and/or surrogate as well as nursing, discussions with consultants, evaluation of patient's response to treatment, examination of patient, obtaining history from patient or surrogate, ordering and performing treatments and interventions, ordering and review of laboratory studies, ordering and review of radiographic studies, pulse oximetry and re-evaluation of patient's condition.  JKarmen BongoMD [facility]  If 7PM-7AM, please contact night-coverage [email or web address] Password TVan [facility] 01/28/2017, 9:57 PM

## 2017-01-28 NOTE — ED Notes (Signed)
Report given to Deatra JamesLyle, RN, ICU

## 2017-01-28 NOTE — ED Notes (Signed)
In and out cath performed by myself and brooke, NT. Urine sample obtained as well as cdiff sample.  Pt skin cleaned and dried, new chucks and draw sheet placed under pt and patient repositioned. Pt alert and oriented and family at bedside at this time.

## 2017-01-28 NOTE — ED Notes (Signed)
Pt repositioned on left side.

## 2017-01-28 NOTE — ED Notes (Signed)
CRITICAL VALUE ALERT  Critical value received:  NA 115  Date of notification:  01/28/2017  Time of notification:  1828  Critical value read back:Yes.    Nurse who received alert:  [facility] RN  MD notified (1st page):  Dr. Deretha EmoryZackowski  Time of first page:  1828  MD notified (2nd page):  Time of second page:  Responding MD:  Dr. Deretha EmoryZackowski  Time MD responded:  [phone]

## 2017-01-28 NOTE — ED Triage Notes (Signed)
Per EMS: [facility] reports weakness, loose stools, unable to keep food down x5-7 days.  Pt was originally in rehab for post-UTI. Pt alert and oriented at this time.

## 2017-01-28 NOTE — ED Notes (Signed)
linens and chuck pads changed

## 2017-01-29 LAB — HEPATIC FUNCTION PANEL
ALT: 47 U/L (ref 14–54)
AST: 48 U/L — ABNORMAL HIGH (ref 15–41)
Albumin: 2.2 g/dL — ABNORMAL LOW (ref 3.5–5.0)
Alkaline Phosphatase: 188 U/L — ABNORMAL HIGH (ref 38–126)
BILIRUBIN INDIRECT: 1.3 mg/dL — AB (ref 0.3–0.9)
Bilirubin, Direct: 1.1 mg/dL — ABNORMAL HIGH (ref 0.1–0.5)
TOTAL PROTEIN: 5.1 g/dL — AB (ref 6.5–8.1)
Total Bilirubin: 2.4 mg/dL — ABNORMAL HIGH (ref 0.3–1.2)

## 2017-01-29 LAB — CBC
HEMATOCRIT: 26.7 % — AB (ref 36.0–46.0)
HEMOGLOBIN: 9.4 g/dL — AB (ref 12.0–15.0)
MCH: 31.9 pg (ref 26.0–34.0)
MCHC: 35.2 g/dL (ref 30.0–36.0)
MCV: 90.5 fL (ref 78.0–100.0)
Platelets: 231 10*3/uL (ref 150–400)
RBC: 2.95 MIL/uL — ABNORMAL LOW (ref 3.87–5.11)
RDW: 15 % (ref 11.5–15.5)
WBC: 4.7 10*3/uL (ref 4.0–10.5)

## 2017-01-29 LAB — MRSA PCR SCREENING: MRSA by PCR: NEGATIVE

## 2017-01-29 LAB — BASIC METABOLIC PANEL
ANION GAP: 10 (ref 5–15)
ANION GAP: 11 (ref 5–15)
Anion gap: 10 (ref 5–15)
Anion gap: 11 (ref 5–15)
Anion gap: 11 (ref 5–15)
BUN: 17 mg/dL (ref 6–20)
BUN: 17 mg/dL (ref 6–20)
BUN: 18 mg/dL (ref 6–20)
BUN: 18 mg/dL (ref 6–20)
BUN: 18 mg/dL (ref 6–20)
CALCIUM: 8 mg/dL — AB (ref 8.9–10.3)
CHLORIDE: 80 mmol/L — AB (ref 101–111)
CHLORIDE: 80 mmol/L — AB (ref 101–111)
CHLORIDE: 82 mmol/L — AB (ref 101–111)
CO2: 25 mmol/L (ref 22–32)
CO2: 25 mmol/L (ref 22–32)
CO2: 26 mmol/L (ref 22–32)
CO2: 27 mmol/L (ref 22–32)
CO2: 27 mmol/L (ref 22–32)
CREATININE: 1.52 mg/dL — AB (ref 0.44–1.00)
CREATININE: 1.6 mg/dL — AB (ref 0.44–1.00)
Calcium: 8.1 mg/dL — ABNORMAL LOW (ref 8.9–10.3)
Calcium: 8.1 mg/dL — ABNORMAL LOW (ref 8.9–10.3)
Calcium: 8.2 mg/dL — ABNORMAL LOW (ref 8.9–10.3)
Calcium: 8.4 mg/dL — ABNORMAL LOW (ref 8.9–10.3)
Chloride: 80 mmol/L — ABNORMAL LOW (ref 101–111)
Chloride: 81 mmol/L — ABNORMAL LOW (ref 101–111)
Creatinine, Ser: 1.24 mg/dL — ABNORMAL HIGH (ref 0.44–1.00)
Creatinine, Ser: 1.26 mg/dL — ABNORMAL HIGH (ref 0.44–1.00)
Creatinine, Ser: 1.4 mg/dL — ABNORMAL HIGH (ref 0.44–1.00)
GFR calc Af Amer: 36 mL/min — ABNORMAL LOW (ref >60)
GFR calc Af Amer: 38 mL/min — ABNORMAL LOW (ref >60)
GFR calc Af Amer: 42 mL/min — ABNORMAL LOW (ref >60)
GFR calc Af Amer: 48 mL/min — ABNORMAL LOW (ref >60)
GFR calc Af Amer: 49 mL/min — ABNORMAL LOW (ref >60)
GFR calc non Af Amer: 36 mL/min — ABNORMAL LOW (ref >60)
GFR calc non Af Amer: 41 mL/min — ABNORMAL LOW (ref >60)
GFR calc non Af Amer: 42 mL/min — ABNORMAL LOW (ref >60)
GFR, EST NON AFRICAN AMERICAN: 33 mL/min — AB (ref >60)
GFR, EST NON AFRICAN AMERICAN: 31 mL/min — AB (ref >60)
GLUCOSE: 84 mg/dL (ref 65–99)
GLUCOSE: 89 mg/dL (ref 65–99)
GLUCOSE: 90 mg/dL (ref 65–99)
Glucose, Bld: 122 mg/dL — ABNORMAL HIGH (ref 65–99)
Glucose, Bld: 165 mg/dL — ABNORMAL HIGH (ref 65–99)
POTASSIUM: 3.9 mmol/L (ref 3.5–5.1)
POTASSIUM: 4 mmol/L (ref 3.5–5.1)
POTASSIUM: 4 mmol/L (ref 3.5–5.1)
POTASSIUM: 4.3 mmol/L (ref 3.5–5.1)
Potassium: 4.2 mmol/L (ref 3.5–5.1)
SODIUM: 117 mmol/L — AB (ref 135–145)
SODIUM: 117 mmol/L — AB (ref 135–145)
Sodium: 117 mmol/L — CL (ref 135–145)
Sodium: 117 mmol/L — CL (ref 135–145)
Sodium: 118 mmol/L — CL (ref 135–145)

## 2017-01-29 LAB — OSMOLALITY: OSMOLALITY: 251 mosm/kg — AB (ref 275–295)

## 2017-01-29 LAB — GLUCOSE, CAPILLARY
GLUCOSE-CAPILLARY: 123 mg/dL — AB (ref 65–99)
GLUCOSE-CAPILLARY: 117 mg/dL — AB (ref 65–99)
Glucose-Capillary: 91 mg/dL (ref 65–99)
Glucose-Capillary: 156 mg/dL — ABNORMAL HIGH (ref 65–99)

## 2017-01-29 LAB — T4, FREE: FREE T4: 1.39 ng/dL — AB (ref 0.61–1.12)

## 2017-01-29 LAB — OSMOLALITY, URINE: OSMOLALITY UR: 167 mosm/kg — AB (ref 300–900)

## 2017-01-29 LAB — NA AND K (SODIUM & POTASSIUM), RAND UR: POTASSIUM UR: 18 mmol/L

## 2017-01-29 MED ORDER — TRIAMCINOLONE ACETONIDE 0.1 % EX CREA
TOPICAL_CREAM | CUTANEOUS | Status: AC
  Filled 2017-01-29: qty 15

## 2017-01-29 MED ORDER — HYDROCERIN EX CREA
TOPICAL_CREAM | Freq: Two times a day (BID) | CUTANEOUS | Status: DC
  Administered 2017-01-29: 21:00:00 via TOPICAL
  Administered 2017-01-29: 1 via TOPICAL
  Administered 2017-01-30 (×2): via TOPICAL
  Administered 2017-01-31: 1 via TOPICAL
  Administered 2017-02-01 – 2017-02-03 (×7): via TOPICAL
  Administered 2017-02-04: 1 via TOPICAL
  Administered 2017-02-04 – 2017-02-06 (×4): via TOPICAL
  Filled 2017-01-29: qty 113

## 2017-01-29 MED ORDER — GLUCERNA SHAKE PO LIQD
237.0000 mL | Freq: Two times a day (BID) | ORAL | Status: DC
  Administered 2017-02-04 – 2017-02-06 (×3): 237 mL via ORAL

## 2017-01-29 MED ORDER — TRIAMCINOLONE ACETONIDE 0.1 % EX CREA
TOPICAL_CREAM | Freq: Two times a day (BID) | CUTANEOUS | Status: DC
  Administered 2017-01-29: 1 via TOPICAL
  Administered 2017-01-29 – 2017-02-02 (×8): via TOPICAL
  Administered 2017-02-02: 1 via TOPICAL
  Administered 2017-02-03 – 2017-02-04 (×3): via TOPICAL
  Administered 2017-02-04: 1 via TOPICAL
  Administered 2017-02-05 – 2017-02-06 (×3): via TOPICAL
  Filled 2017-01-29: qty 15

## 2017-01-29 NOTE — Progress Notes (Signed)
Subjective: She was admitted from a skilled care facility with weakness and inability to walk. She was found to have very low sodium level. She has fairly mild cellulitis of her leg which is a recurrent problem. Her uric acid level is very high but she has had not tolerated either allopurinol or Uloric. Her nutrition has suffered substantially. Her sodium level has not come up much overnight. I think she's dehydrated. Her liver functions are elevated Her blood pressure is low. Objective: Vital signs in last 24 hours: Temp:  [98 F (36.7 C)-98.9 F (37.2 C)] 98.5 F (36.9 C) (05/08 0400) Pulse Rate:  [79-98] 97 (05/08 0600) Resp:  [11-18] 11 (05/08 0500) BP: (90-119)/(31-70) 90/56 (05/08 0600) SpO2:  [89 %-100 %] 100 % (05/08 0805) Weight:  [95.3 kg (210 lb)-96.1 kg (211 lb 13.8 oz)] 96.1 kg (211 lb 13.8 oz) (05/08 0500) Weight change:  Last BM Date: 01/29/17  Intake/Output from previous day: 05/07 0701 - 05/08 0700 In: 811.3 [I.V.:811.3] Out: -   PHYSICAL EXAM General appearance: She is awake but sleepy. Resp: clear to auscultation bilaterally Cardio: regular rate and rhythm, S1, S2 normal, no murmur, click, rub or gallop GI: soft, non-tender; bowel sounds normal; no masses,  no organomegaly Extremities: No edema. She has some cellulitic changes in the area of the left ankle Skin warm and dry. Mucous membranes are moist  Lab Results:  Results for orders placed or performed during the hospital encounter of 01/28/17 (from the past 48 hour(s))  Urinalysis, Routine w reflex microscopic     Status: Abnormal   Collection Time: 01/28/17  4:26 PM  Result Value Ref Range   Color, Urine YELLOW YELLOW   APPearance TURBID (A) CLEAR   Specific Gravity, Urine 1.004 (L) 1.005 - 1.030   pH 5.0 5.0 - 8.0   Glucose, UA NEGATIVE NEGATIVE mg/dL   Hgb urine dipstick MODERATE (A) NEGATIVE   Bilirubin Urine NEGATIVE NEGATIVE   Ketones, ur NEGATIVE NEGATIVE mg/dL   Protein, ur 100 (A) NEGATIVE  mg/dL   Nitrite NEGATIVE NEGATIVE   Leukocytes, UA MODERATE (A) NEGATIVE   RBC / HPF 0-5 0 - 5 RBC/hpf   WBC, UA 6-30 0 - 5 WBC/hpf   Bacteria, UA RARE (A) NONE SEEN   Squamous Epithelial / LPF 0-5 (A) NONE SEEN   Hyaline Casts, UA PRESENT    Non Squamous Epithelial 0-5 (A) NONE SEEN  C difficile quick scan w PCR reflex     Status: Abnormal   Collection Time: 01/28/17  4:53 PM  Result Value Ref Range   C Diff antigen POSITIVE (A) NEGATIVE   C Diff toxin NEGATIVE NEGATIVE   C Diff interpretation Results are indeterminate. See PCR results.   Clostridium Difficile by PCR     Status: None   Collection Time: 01/28/17  4:53 PM  Result Value Ref Range   Toxigenic C Difficile by pcr NEGATIVE NEGATIVE    Comment: Patient is colonized with non toxigenic C. difficile. May not need treatment unless significant symptoms are present. Performed at [facility], [place] [address], [place], [place] [address]   CBC with Differential/Platelet     Status: Abnormal   Collection Time: 01/28/17  5:17 PM  Result Value Ref Range   WBC 5.6 4.0 - 10.5 K/uL   RBC 3.07 (L) 3.87 - 5.11 MIL/uL   Hemoglobin 9.7 (L) 12.0 - 15.0 g/dL   HCT 27.9 (L) 36.0 - 46.0 %   MCV 90.9 78.0 - 100.0  fL   MCH 31.6 26.0 - 34.0 pg   MCHC 34.8 30.0 - 36.0 g/dL   RDW 15.1 11.5 - 15.5 %   Platelets 280 150 - 400 K/uL    Comment: SPECIMEN CHECKED FOR CLOTS GIANT PLATELETS SEEN PLATELET COUNT CONFIRMED BY SMEAR    Neutrophils Relative % 68 %   Neutro Abs 3.8 1.7 - 7.7 K/uL   Lymphocytes Relative 13 %   Lymphs Abs 0.7 0.7 - 4.0 K/uL   Monocytes Relative 17 %   Monocytes Absolute 0.9 0.1 - 1.0 K/uL   Eosinophils Relative 2 %   Eosinophils Absolute 0.1 0.0 - 0.7 K/uL   Basophils Relative 1 %   Basophils Absolute 0.0 0.0 - 0.1 K/uL   WBC Morphology WHITE COUNT CONFIRMED ON SMEAR    RBC Morphology RARE NRBCs     Comment: BASOPHILIC STIPPLING  Lipase, blood     Status: None   Collection Time: 01/28/17  5:17 PM  Result  Value Ref Range   Lipase 12 11 - 51 U/L  Comprehensive metabolic panel     Status: Abnormal   Collection Time: 01/28/17  5:17 PM  Result Value Ref Range   Sodium 115 (LL) 135 - 145 mmol/L    Comment: CRITICAL RESULT CALLED TO, READ BACK BY AND VERIFIED WITH: CARDWELL,L ON 01/28/17 AT 1825 BY LOY,C    Potassium 4.4 3.5 - 5.1 mmol/L   Chloride 77 (L) 101 - 111 mmol/L   CO2 29 22 - 32 mmol/L   Glucose, Bld 112 (H) 65 - 99 mg/dL   BUN 18 6 - 20 mg/dL   Creatinine, Ser 1.27 (H) 0.44 - 1.00 mg/dL   Calcium 8.4 (L) 8.9 - 10.3 mg/dL   Total Protein 5.5 (L) 6.5 - 8.1 g/dL   Albumin 2.3 (L) 3.5 - 5.0 g/dL   AST 49 (H) 15 - 41 U/L   ALT 53 14 - 54 U/L   Alkaline Phosphatase 206 (H) 38 - 126 U/L   Total Bilirubin 2.6 (H) 0.3 - 1.2 mg/dL   GFR calc non Af Amer 41 (L) >60 mL/min   GFR calc Af Amer 47 (L) >60 mL/min    Comment: (NOTE) The eGFR has been calculated using the CKD EPI equation. This calculation has not been validated in all clinical situations. eGFR's persistently <60 mL/min signify possible Chronic Kidney Disease.    Anion gap 9 5 - 15  Lactic acid, plasma     Status: None   Collection Time: 01/28/17  5:17 PM  Result Value Ref Range   Lactic Acid, Venous 1.0 0.5 - 1.9 mmol/L  Brain natriuretic peptide     Status: None   Collection Time: 01/28/17  5:17 PM  Result Value Ref Range   B Natriuretic Peptide 80.0 0.0 - 100.0 pg/mL  Troponin I     Status: None   Collection Time: 01/28/17  5:17 PM  Result Value Ref Range   Troponin I <0.03 <0.03 ng/mL  TSH     Status: Abnormal   Collection Time: 01/28/17  5:17 PM  Result Value Ref Range   TSH 5.186 (H) 0.350 - 4.500 uIU/mL    Comment: Performed by a 3rd Generation assay with a functional sensitivity of <=0.01 uIU/mL.  Basic metabolic panel     Status: Abnormal   Collection Time: 01/28/17  9:03 PM  Result Value Ref Range   Sodium 116 (LL) 135 - 145 mmol/L    Comment: CRITICAL RESULT CALLED TO, READ BACK  BY AND VERIFIED  WITH: CRABTREE,B ON 01/28/17 AT 2155 BY LOY,C    Potassium 4.2 3.5 - 5.1 mmol/L   Chloride 78 (L) 101 - 111 mmol/L   CO2 28 22 - 32 mmol/L   Glucose, Bld 96 65 - 99 mg/dL   BUN 18 6 - 20 mg/dL   Creatinine, Ser 1.28 (H) 0.44 - 1.00 mg/dL   Calcium 8.3 (L) 8.9 - 10.3 mg/dL   GFR calc non Af Amer 40 (L) >60 mL/min   GFR calc Af Amer 47 (L) >60 mL/min    Comment: (NOTE) The eGFR has been calculated using the CKD EPI equation. This calculation has not been validated in all clinical situations. eGFR's persistently <60 mL/min signify possible Chronic Kidney Disease.    Anion gap 10 5 - 15  Uric acid     Status: Abnormal   Collection Time: 01/28/17  9:10 PM  Result Value Ref Range   Uric Acid, Serum 14.6 (H) 2.3 - 6.6 mg/dL  MRSA PCR Screening     Status: None   Collection Time: 01/28/17 11:24 PM  Result Value Ref Range   MRSA by PCR NEGATIVE NEGATIVE    Comment:        The GeneXpert MRSA Assay (FDA approved for NASAL specimens only), is one component of a comprehensive MRSA colonization surveillance program. It is not intended to diagnose MRSA infection nor to guide or monitor treatment for MRSA infections.   Basic metabolic panel     Status: Abnormal   Collection Time: 01/28/17 11:39 PM  Result Value Ref Range   Sodium 117 (LL) 135 - 145 mmol/L    Comment: CRITICAL RESULT CALLED TO, READ BACK BY AND VERIFIED WITH: FREEMAN,L. AT 4496 ON 01/29/2017 BY EVA    Potassium 4.3 3.5 - 5.1 mmol/L   Chloride 80 (L) 101 - 111 mmol/L   CO2 27 22 - 32 mmol/L   Glucose, Bld 90 65 - 99 mg/dL   BUN 18 6 - 20 mg/dL   Creatinine, Ser 1.26 (H) 0.44 - 1.00 mg/dL   Calcium 8.4 (L) 8.9 - 10.3 mg/dL   GFR calc non Af Amer 41 (L) >60 mL/min   GFR calc Af Amer 48 (L) >60 mL/min    Comment: (NOTE) The eGFR has been calculated using the CKD EPI equation. This calculation has not been validated in all clinical situations. eGFR's persistently <60 mL/min signify possible Chronic Kidney Disease.     Anion gap 10 5 - 15  Glucose, capillary     Status: None   Collection Time: 01/28/17 11:48 PM  Result Value Ref Range   Glucose-Capillary 96 65 - 99 mg/dL   Comment 1 Notify RN   Basic metabolic panel     Status: Abnormal   Collection Time: 01/29/17  3:27 AM  Result Value Ref Range   Sodium 117 (LL) 135 - 145 mmol/L    Comment: CRITICAL RESULT CALLED TO, READ BACK BY AND VERIFIED WITH: DANIELS,J AT 4:10AM ON 01/29/17 BY FESTERMAN,C    Potassium 4.0 3.5 - 5.1 mmol/L   Chloride 82 (L) 101 - 111 mmol/L   CO2 25 22 - 32 mmol/L   Glucose, Bld 84 65 - 99 mg/dL   BUN 18 6 - 20 mg/dL   Creatinine, Ser 1.24 (H) 0.44 - 1.00 mg/dL   Calcium 8.2 (L) 8.9 - 10.3 mg/dL   GFR calc non Af Amer 42 (L) >60 mL/min   GFR calc Af Amer 49 (L) >60 mL/min  Comment: (NOTE) The eGFR has been calculated using the CKD EPI equation. This calculation has not been validated in all clinical situations. eGFR's persistently <60 mL/min signify possible Chronic Kidney Disease.    Anion gap 10 5 - 15  Hepatic function panel     Status: Abnormal   Collection Time: 01/29/17  3:27 AM  Result Value Ref Range   Total Protein 5.1 (L) 6.5 - 8.1 g/dL   Albumin 2.2 (L) 3.5 - 5.0 g/dL   AST 48 (H) 15 - 41 U/L   ALT 47 14 - 54 U/L   Alkaline Phosphatase 188 (H) 38 - 126 U/L   Total Bilirubin 2.4 (H) 0.3 - 1.2 mg/dL   Bilirubin, Direct 1.1 (H) 0.1 - 0.5 mg/dL   Indirect Bilirubin 1.3 (H) 0.3 - 0.9 mg/dL  CBC     Status: Abnormal   Collection Time: 01/29/17  3:27 AM  Result Value Ref Range   WBC 4.7 4.0 - 10.5 K/uL   RBC 2.95 (L) 3.87 - 5.11 MIL/uL   Hemoglobin 9.4 (L) 12.0 - 15.0 g/dL   HCT 26.7 (L) 36.0 - 46.0 %   MCV 90.5 78.0 - 100.0 fL   MCH 31.9 26.0 - 34.0 pg   MCHC 35.2 30.0 - 36.0 g/dL   RDW 15.0 11.5 - 15.5 %   Platelets 231 150 - 400 K/uL  Glucose, capillary     Status: None   Collection Time: 01/29/17  7:27 AM  Result Value Ref Range   Glucose-Capillary 91 65 - 99 mg/dL  Basic metabolic panel      Status: Abnormal   Collection Time: 01/29/17  7:48 AM  Result Value Ref Range   Sodium 118 (LL) 135 - 145 mmol/L    Comment: CRITICAL RESULT CALLED TO, READ BACK BY AND VERIFIED WITH: HOWARD,C AT 8:30AM ON 01/29/17 BY FESTERMAN,C    Potassium 3.9 3.5 - 5.1 mmol/L   Chloride 80 (L) 101 - 111 mmol/L   CO2 27 22 - 32 mmol/L   Glucose, Bld 89 65 - 99 mg/dL   BUN 17 6 - 20 mg/dL   Creatinine, Ser 1.40 (H) 0.44 - 1.00 mg/dL   Calcium 8.1 (L) 8.9 - 10.3 mg/dL   GFR calc non Af Amer 36 (L) >60 mL/min   GFR calc Af Amer 42 (L) >60 mL/min    Comment: (NOTE) The eGFR has been calculated using the CKD EPI equation. This calculation has not been validated in all clinical situations. eGFR's persistently <60 mL/min signify possible Chronic Kidney Disease.    Anion gap 11 5 - 15    ABGS No results for input(s): PHART, PO2ART, TCO2, HCO3 in the last 72 hours.  Invalid input(s): PCO2 CULTURES Recent Results (from the past 240 hour(s))  C difficile quick scan w PCR reflex     Status: Abnormal   Collection Time: 01/28/17  4:53 PM  Result Value Ref Range Status   C Diff antigen POSITIVE (A) NEGATIVE Final   C Diff toxin NEGATIVE NEGATIVE Final   C Diff interpretation Results are indeterminate. See PCR results.  Final  Clostridium Difficile by PCR     Status: None   Collection Time: 01/28/17  4:53 PM  Result Value Ref Range Status   Toxigenic C Difficile by pcr NEGATIVE NEGATIVE Final    Comment: Patient is colonized with non toxigenic C. difficile. May not need treatment unless significant symptoms are present. Performed at [facility], [place] [address], [place], [place]  [address]   MRSA PCR Screening     Status: None   Collection Time: 01/28/17 11:24 PM  Result Value Ref Range Status   MRSA by PCR NEGATIVE NEGATIVE Final    Comment:        The GeneXpert MRSA Assay (FDA approved for NASAL specimens only), is one component of a comprehensive MRSA colonization surveillance  program. It is not intended to diagnose MRSA infection nor to guide or monitor treatment for MRSA infections.    Studies/Results: Dg Chest 1 View  Result Date: 01/28/2017 CLINICAL DATA:  Acute onset of shortness of breath. Initial encounter. EXAM: CHEST 1 VIEW COMPARISON:  Chest radiograph performed 12/30/2016 FINDINGS: The lungs are well-aerated. Minimal bibasilar atelectasis is noted. There is no evidence of pleural effusion or pneumothorax. The cardiomediastinal silhouette is mildly enlarged. No acute osseous abnormalities are seen. IMPRESSION: Minimal bibasilar atelectasis noted.  Lungs otherwise clear. Electronically Signed   By: Roanna Raider M.D.   On: 01/28/2017 19:36   Ct Head Wo Contrast  Result Date: 01/28/2017 CLINICAL DATA:  Weakness.  Nausea, loose stools and diarrhea. EXAM: CT HEAD WITHOUT CONTRAST TECHNIQUE: Contiguous axial images were obtained from the base of the skull through the vertex without intravenous contrast. COMPARISON:  Head CT 12/31/2016 FINDINGS: Brain: No evidence of acute infarction, hemorrhage, hydrocephalus, extra-axial collection or mass lesion/mass effect. Mild chronic small vessel ischemia is stable from prior exam. Vascular: Atherosclerosis of skullbase vasculature without hyperdense vessel or abnormal calcification. Skull: Normal. Negative for fracture or focal lesion. Sinuses/Orbits: Paranasal sinuses and mastoid air cells are clear. The visualized orbits are unchanged with mild bilateral exophthalmos. Other: None. IMPRESSION: No acute intracranial abnormality. Electronically Signed   By: Rubye Oaks M.D.   On: 01/28/2017 19:51   Ct Abdomen Pelvis W Contrast  Result Date: 01/28/2017 CLINICAL DATA:  Shortness of breath, weakness, nausea and diarrhea EXAM: CT ABDOMEN AND PELVIS WITH CONTRAST TECHNIQUE: Multidetector CT imaging of the abdomen and pelvis was performed using the standard protocol following bolus administration of intravenous contrast.  CONTRAST:  36mL ISOVUE-300 IOPAMIDOL (ISOVUE-300) INJECTION 61% COMPARISON:  02/02/2015 FINDINGS: Lower chest: Mild cardiomegaly. No pericardial or pleural effusion. Bibasilar atelectasis. Atherosclerosis of the descending thoracic aorta. Hepatobiliary: Mild hypoattenuation of the liver parenchyma compatible with hepatic steatosis or fatty infiltration. No focal hepatic abnormality or biliary dilatation. Patent hepatic and portal veins. Remote cholecystectomy noted. Pancreas: Unremarkable. No pancreatic ductal dilatation or surrounding inflammatory changes. Spleen: Normal in size without focal abnormality. Adrenals/Urinary Tract: Adrenal glands are unremarkable. Kidneys are normal, without renal calculi, focal lesion, or hydronephrosis. Bladder is unremarkable. Stomach/Bowel: Negative for bowel obstruction, significant dilatation, ileus, or free air. No fluid collection or abscess. Remote appendectomy noted. Colon is collapsed. Minor scattered diverticulosis. Vascular/Lymphatic: Aortic atherosclerosis noted without aneurysm or dissection. No occlusive process. No retroperitoneal hemorrhage. No adenopathy. Reproductive: Uterus and bilateral adnexa are unremarkable. Other: No abdominal wall hernia or abnormality. No abdominopelvic ascites. Musculoskeletal: Diffuse lumbar spondylosis and facet arthropathy. No acute osseous finding or compression fracture. IMPRESSION: No acute intra-abdominal or pelvic finding by CT. Remote cholecystectomy and appendectomy. Hepatic steatosis Abdominal atherosclerosis Diverticulosis. Electronically Signed   By: Judie Petit.  Shick M.D.   On: 01/28/2017 19:57    Medications:  Prior to Admission:  Prescriptions Prior to Admission  Medication Sig Dispense Refill Last Dose  . acetaminophen (TYLENOL) 325 MG tablet Take 2 tablets (650 mg total) by mouth every 6 (six) hours as needed for mild pain (or Fever >/= 101).   01/28/2017 at  Unknown time  . albuterol (PROVENTIL HFA;VENTOLIN HFA) 108 (90  Base) MCG/ACT inhaler Inhale 2 puffs into the lungs 4 (four) times daily.    01/28/2017 at Unknown time  . apixaban (ELIQUIS) 5 MG TABS tablet Take 1 tablet (5 mg total) by mouth 2 (two) times daily. 60 tablet 5 01/28/2017 at [place]  . calcium carbonate (TUMS - DOSED IN MG ELEMENTAL CALCIUM) 500 MG chewable tablet Chew 1 tablet by mouth 2 (two) times daily.   01/28/2017 at Unknown time  . cetirizine (ZYRTEC) 10 MG tablet Take 10 mg by mouth daily.   01/28/2017 at Unknown time  . cholecalciferol (VITAMIN D) 1000 units tablet Take 1,000 Units by mouth every morning.   01/28/2017 at Unknown time  . cholecalciferol (VITAMIN D) 400 units TABS tablet Take 400 Units by mouth daily.   01/28/2017 at Unknown time  . colchicine 0.6 MG tablet Take 0.6 mg by mouth 2 (two) times daily.   01/28/2017 at Unknown time  . diltiazem (CARDIZEM CD) 120 MG 24 hr capsule Take 1 capsule (120 mg total) by mouth daily. 90 capsule 3 01/28/2017 at Unknown time  . fluticasone furoate-vilanterol (BREO ELLIPTA) 200-25 MCG/INH AEPB Inhale 1 puff into the lungs daily.   01/28/2017 at Unknown time  . gabapentin (NEURONTIN) 100 MG capsule Take 100 mg by mouth 2 (two) times daily.   01/28/2017 at [place]  . HYDROcodone-acetaminophen (NORCO) 7.5-325 MG tablet Take 1 tablet by mouth 3 (three) times daily as needed for moderate pain.   01/27/2017 at Unknown time  . insulin glargine (LANTUS) 100 UNIT/ML injection Inject 0.2 mLs (20 Units total) into the skin daily. 10 mL 11 01/27/2017 at 2000  . insulin lispro (HUMALOG) 100 UNIT/ML injection Inject 3-20 Units into the skin 3 (three) times daily before meals. 70-100= 0 units 101-150=3 units 151-200=4 units 201-250=8 units 251-300=12units 301-350=16units Greater than 350= 20 units   01/28/2017 at Unknown time  . levothyroxine (SYNTHROID, LEVOTHROID) 137 MCG tablet Take 137 mcg by mouth daily before breakfast.   01/28/2017 at Unknown time  . Magnesium 250 MG TABS Take 2 tablets by mouth 2 (two) times daily.    01/28/2017 at  Unknown time  . metoprolol tartrate (LOPRESSOR) 25 MG tablet TAKE 1/2 TABLET BY MOUTH TWICE DAILY 90 tablet 1 01/28/2017 at 800a  . Multiple Vitamin (MULTIVITAMIN WITH MINERALS) TABS tablet Take 1 tablet by mouth daily.   01/28/2017 at Unknown time  . omeprazole (PRILOSEC) 20 MG capsule TAKE ONE CAPSULE BY MOUTH DAILY 90 capsule 3 01/28/2017 at Unknown time  . ondansetron (ZOFRAN) 4 MG tablet Take 1 tablet (4 mg total) by mouth every 6 (six) hours as needed for nausea. 20 tablet 0 01/28/2017 at Unknown time  . potassium chloride SA (K-DUR,KLOR-CON) 20 MEQ tablet Take 1 tablet (20 mEq total) by mouth daily.   01/28/2017 at Unknown time  . torsemide (DEMADEX) 20 MG tablet Take 1 tablet (20 mg total) by mouth 2 (two) times daily. (may take an extra '20mg'$  as needed)   01/28/2017 at Unknown time  . triamcinolone (NASACORT ALLERGY 24HR) 55 MCG/ACT AERO nasal inhaler Place 2 sprays into the nose daily as needed (for allergies).    unknown  . feeding supplement, GLUCERNA SHAKE, (GLUCERNA SHAKE) LIQD Take 237 mLs by mouth 3 (three) times daily between meals.  0   . gabapentin (NEURONTIN) 300 MG capsule Take 1 capsule (300 mg total) by mouth 2 (two) times daily. (Patient not taking: Reported on 01/28/2017)  Not Taking at Unknown time  . polyvinyl alcohol (LIQUIFILM TEARS) 1.4 % ophthalmic solution Place 1 drop into both eyes 4 (four) times daily as needed for dry eyes. 15 mL 0 unknown   Scheduled: . albuterol  3 mL Inhalation QID  . apixaban  5 mg Oral BID  . clindamycin  300 mg Oral Q8H  . colchicine  0.6 mg Oral BID  . docusate sodium  100 mg Oral BID  . feeding supplement (GLUCERNA SHAKE)  237 mL Oral TID BM  . fluticasone furoate-vilanterol  1 puff Inhalation Daily  . gabapentin  300 mg Oral BID  . triamcinolone cream   Topical BID   And  . hydrocerin   Topical BID  . insulin aspart  0-15 Units Subcutaneous TID WC  . insulin glargine  20 Units Subcutaneous Daily  . levothyroxine  137 mcg Oral QAC breakfast  .  loratadine  10 mg Oral Daily  . metoprolol tartrate  12.5 mg Oral BID  . pantoprazole  40 mg Oral Daily  . sodium chloride flush  3 mL Intravenous Q12H   Continuous: . sodium chloride 100 mL/hr at 01/29/17 0258   NID:[id number] **OR** acetaminophen, HYDROcodone-acetaminophen, ondansetron **OR** ondansetron (ZOFRAN) IV, polyvinyl alcohol  Assesment: She was admitted with hyponatremia. She is anticoagulated for paroxysmal atrial fibrillation and she has been on both metoprolol and diltiazem. Her blood pressure is low and I'm going to discontinue diltiazem for now. Her sodium level is not much better. I will increase IV fluids because I think she's somewhat dehydrated as well.  She has indeterminate C. difficile status so on going to see how she does and whether she develops overt diarrhea.  She had projectile vomiting over the weekend prior to admission which I think is likely related to her low sodium. She's had a little bit of nausea here in the hospital  She has COPD at baseline and she is on oxygen here.  She has diabetes and is on sliding scale.  She has cellulitis and is on oral antibiotics  She has malnutrition which is a new problem  She has extreme deconditioning  She has elevated liver function testing that I think is related to hyponatremia dehydration etc. and this will be followed Principal Problem:   Hyponatremia Active Problems:   HTN (hypertension)   COPD (chronic obstructive pulmonary disease) (HCC)   Neuropathic pain   Hypothyroidism   PAF (paroxysmal atrial fibrillation) (HCC)   Cellulitis of left lower extremity   Chronic anticoagulation   Sleep apnea   Gout attack   Hyperglycemia   Elevated LFTs   Diabetes mellitus type 2 in obese ([place])    Plan: Continue IV fluids. Continue to check basic metabolic profile.    LOS: 1 day   Saliah Crisp L 01/29/2017, 8:34 AM

## 2017-01-29 NOTE — Progress Notes (Signed)
Initial Nutrition Assessment  DOCUMENTATION CODES:  Obesity unspecified  INTERVENTION:  Continue Glucerna Shake po BID, each supplement provides 220 kcal and 10 grams of protein  Meal requests taken  NUTRITION DIAGNOSIS:  Inadequate protein intake related to nausea, vomiting, poor appetite as evidenced by per patient/family report.  GOAL:  Patient will meet greater than or equal to 90% of their needs  MONITOR:  PO intake, Supplement acceptance, Diet advancement, Labs, Weight trends  REASON FOR ASSESSMENT:  Consult Assessment of nutrition requirement/status  ASSESSMENT:  76 y/o female PMHx Afib, CKD3, Hypothyroidism, GERD, HTN, CHF, COPD, recent hospitilization 4/8-4/16 for UTI/Cellulitis. Was d/c to SNF. Represents with weakness, nausea, vomiting, poor appetite, diarrhea. Readmitted after found to be markedly hyponatremic. Also with chronic cellulitis and +Cdif antigen  Pt states "I have not eaten in a month". She includes her last hospitalization. (Of note, per documentation, she was consuming 50-100% of her meals the last few days of her last admission).   She did not eat well at the SNF. She sounds to not have liked the food there and got tired of the same options. The last week, her poor po intake was worse due to the nausea, vomiting, and diarrhea. She says she was constipated, was given a laxative, but then sat in the same spot for days and "held her BM for 3-4 days" and when she finally got up she had diarrhea and has had it since.   At the SNF, she did not take any vitamins, nor did she drink any supplements, though they sound to have offered her Ensure, she does not like it.   Despite her reported poor po intake x 1 month, her admit weight this admission (210 lb 2 oz) is <2 lbs different then her wt from last admission (211 lb, 14 oz)  Pt seems to dramatize as her sister, who was also present, questioned the severity of the pt's report and offered a less alarming history.    At this time, the patient is still nauseated. She says she is a heightened sensitivity to sweet items right now, desires less sweet items.  She is drinking Glucerna. She wants "lighter meals" ie soups and sandwiches. Can order. Took other misc food preferences.   Encourage use PRN zofran.   Physical Exam: No wasting  Labs: Hyponatremic (117), Creat increasing. BG 90-120, Albumin: 2.2,  Meds: Glucerna, Colace, Insulin, PPI, IVF, PRN zofran, PO abx   Recent Labs Lab 01/29/17 0327 01/29/17 0748 01/29/17 1122  NA 117* 118* 117*  K 4.0 3.9 4.0  CL 82* 80* 80*  CO2 25 27 26   BUN 18 17 17   CREATININE 1.24* 1.40* 1.52*  CALCIUM 8.2* 8.1* 8.1*  GLUCOSE 84 89 122*   Diet Order:  Diet renal with fluid restriction Fluid restriction: 1200 mL Fluid; Room service appropriate? Yes; Fluid consistency: Thin  Skin: MSAD to buttocks, Cellulitis to L lower leg  Last BM:  5/8  Height:  Ht Readings from Last 1 Encounters:  01/28/17 5\' 2"  (1.575 m)   Weight:  Wt Readings from Last 1 Encounters:  01/29/17 211 lb 13.8 oz (96.1 kg)   Wt Readings from Last 10 Encounters:  01/29/17 211 lb 13.8 oz (96.1 kg)  01/05/17 227 lb 8.2 oz (103.2 kg)  12/27/16 210 lb (95.3 kg)  11/07/16 214 lb (97.1 kg)  10/08/16 215 lb (97.5 kg)  09/05/16 218 lb 8 oz (99.1 kg)  08/31/16 227 lb (103 kg)  08/22/16 220 lb (99.8 kg)  07/24/16 217 lb (98.4 kg)  07/12/16 217 lb (98.4 kg)   Ideal Body Weight:  50 kg  BMI:  Body mass index is 38.75 kg/m.  Estimated Nutritional Needs:  Kcal:  1500-1700 (17-19 kcal/kg Adjusted renal wt) Protein:  60-70g Pro (1.2-1.4 g/kg ibw) Fluid:  <1.2 L -> Per MD fluid restriction  EDUCATION NEEDS:  No education needs identified at this time  Christophe Louis RD, LDN, CNSC Clinical Nutrition Pager: [phone] 01/29/2017 1:48 PM

## 2017-01-29 NOTE — Evaluation (Signed)
Physical Therapy Evaluation Patient Details Name: Yolanda [name] MRN: [id number] DOB: 1941-03-24 Today's Date: 01/29/2017   History of Present Illness  76 y.o. female with medical history significant of Afib on Eliquis, Stage 3 CKD, hypothyroidism, GERD, HTN, diastolic heart failure, and COPD.  She was hospitalized here from 4/8-16 for acute encephalopathy thought to be related to UTI and cellulitis of the leg; she was discharged to [facility].  While there, "it's been a long 3 weeks".  She reports laying in one spot for days.  She was still having extreme pain that she had while here, so they were unable  to start rehab.  They gave her hydrocodone to the point that she had tremors and was out of it.  She diagnosed herself with gouty attack; sister went to see Dr. Olena Leatherwood and he prescribed a new medication.  They were 2 weeks into the rehab by the time she was better.  This week, she was able to assist herself in the shower.  She has only taken a few steps in the whole month.  This weekend, she had projectile vomiting and diarrhea.  Vision was increasingly blurred, difficulty focusing.  Xanax at home was previously prn insomnia and she rarely took it, but she has been getting it BID at rehab.  Xanax was discontinued 2 days ago.  Vision is slightly better but not good.  Ongoing diarrhea, no further vomiting.  She went to an appointment with Dr. Juanetta Gosling today and he suggested that they go back and sign her out of [place] and return for admission to [facility].  Dx: Hyponatremia, and L LE cellulitis.     Clinical Impression  Pt received in bed, and is agreeable to PT evaluation.  She was admitted from [facility], where she states that she was not getting out of the bed much, and if she did, it was with a lift.  However, prior to her admission in April 2018, she was living alone, and modified independent with ambulation, and ADL's.  During today's PT evaluation, she required Mod A for supine<>sit, Min A  with RW for sit<>stand, and Mod A for transfer bed<>BSC<>chair.  She demonstrates global weakness from immobility, as well as a significant fear of falling.  She is not safe to d/c home at this time, and is recommended for SNF, however she does not want to return to [facility].  OT evaluation is recommended to promote independence with ADL's.      Follow Up Recommendations SNF (Pt does not want to return to [facility], but is willing to try another SNF. )    Equipment Recommendations  None recommended by PT    Recommendations for Other Services OT consult     Precautions / Restrictions Precautions Precautions: Fall Precaution Comments: 3 falls - at church, down the steps, and then just prior to admission - fell off a stool in the bathroom.  Restrictions Weight Bearing Restrictions: No      Mobility  Bed Mobility Overal bed mobility: Needs Assistance Bed Mobility: Supine to Sit     Supine to sit: Mod assist;HOB elevated     General bed mobility comments: Pt was able to advance LE's off the EOB, however, she required assistance to lift trunk up into sitting position.  She was able to scoot hips forward to have feet on the floor.  She requires supervision for static sitting balance.    Transfers Overall transfer level: Needs assistance Equipment used: Rolling walker (2 wheeled)  Transfers: Sit to/from Stand Sit to Stand: Min assist Stand pivot transfers: Mod assist       General transfer comment: Pt requires vc's for hand placement when going to stand.  As she is pushing into upright position she attempts to weight bear through forearms on the RW, and then required seated rest break.  Pt expressed feeling fatigued, however mobility seems to be more limited by fear.  Encouraged pt to extend up onto wrists/hands to use the RW.  Pt was then able to perform transfer bed<>BSC, however she demonstrates poor eccentric control when going to sit down.  She required total A for hygiene  after BM.  She was able to transfer BSC<>chair, however she again demonstrated poor eccentric control into the chair and required multiple cues to continue turning and get lined up and reach back for the chair prior to sitting.    Ambulation/Gait Ambulation/Gait assistance:  (NA due to fatigue with transfers. )              Stairs            Wheelchair Mobility    Modified Rankin (Stroke Patients Only)       Balance Overall balance assessment: History of Falls;Needs assistance Sitting-balance support: Bilateral upper extremity supported;Feet supported Sitting balance-Leahy Scale: Good     Standing balance support: Bilateral upper extremity supported Standing balance-Leahy Scale: Poor                               Pertinent Vitals/Pain Pain Assessment: No/denies pain    Home Living   Living Arrangements: Alone (But has [facility] since her previous admission.  ) Available Help at Discharge:  (house cleaner) Type of Home: House Home Access: Stairs to enter Entrance Stairs-Rails: None Entrance Stairs-Number of Steps: 4-5 Home Layout: One level Home Equipment: Cane - single point      Prior Function Level of Independence: Independent with assistive device(s)   Gait / Transfers Assistance Needed: Pt states that she has not been able to do her therapy [facility] due to pain in her LE's.  She states that she has laid in the bed for the past month, and has occasionally be able to transfer into a chair for showering via lift.  Prior to her admission in April she states she occasionally used the cane for ambulation   ADL's / Homemaking Assistance Needed: For the past month, she has been at [facility] and has required assistance for dressing and bathing.  Prior to her admission in April, she was independent with ADL's.   Comments: Prior to admission in April she was driving, and was able to ambulate community distances.        Hand Dominance   Dominant Hand: Right    Extremity/Trunk Assessment   Upper Extremity Assessment Upper Extremity Assessment: Generalized weakness    Lower Extremity Assessment Lower Extremity Assessment: Generalized weakness       Communication   Communication: No difficulties  Cognition Arousal/Alertness: Lethargic (Sleepy - needs cues to open eyes when sitting up on the EOB. ) Behavior During Therapy: WFL for tasks assessed/performed Overall Cognitive Status: Within Functional Limits for tasks assessed                                        General Comments  Exercises     Assessment/Plan    PT Assessment Patient needs continued PT services  PT Problem List Decreased strength;Decreased activity tolerance;Decreased range of motion;Decreased balance;Decreased mobility;Decreased knowledge of use of DME;Decreased safety awareness;Decreased knowledge of precautions;Obesity;Pain;Impaired sensation;Decreased coordination       PT Treatment Interventions DME instruction;Gait training;Functional mobility training;Therapeutic activities;Therapeutic exercise;Balance training;Patient/family education;Wheelchair mobility training    PT Goals (Current goals can be found in the Care Plan section)  Acute Rehab PT Goals Patient Stated Goal: To get stronger PT Goal Formulation: With patient/family Time For Goal Achievement: 02/12/17 Potential to Achieve Goals: Good    Frequency Min 6X/week   Barriers to discharge Decreased caregiver support lives alone    Co-evaluation               AM-PAC PT "6 Clicks" Daily Activity  Outcome Measure Difficulty turning over in bed (including adjusting bedclothes, sheets and blankets)?: A Little Difficulty moving from lying on back to sitting on the side of the bed? : A Lot Difficulty sitting down on and standing up from a chair with arms (e.g., wheelchair, bedside commode, etc,.)?: A Lot Help needed moving to  and from a bed to chair (including a wheelchair)?: A Lot Help needed walking in hospital room?: A Lot Help needed climbing 3-5 steps with a railing? : Total 6 Click Score: 12    End of Session Equipment Utilized During Treatment: Gait belt;Oxygen Activity Tolerance: Patient limited by fatigue Patient left: in chair;with call bell/phone within reach Nurse Communication: Mobility status;Need for lift equipment (use STEDY for transfer BTB.) PT Visit Diagnosis: Other abnormalities of gait and mobility (R26.89);Muscle weakness (generalized) (M62.81);History of falling (Z91.81);Difficulty in walking, not elsewhere classified (R26.2);Unsteadiness on feet (R26.81)    Time: 1610-9604 PT Time Calculation (min) (ACUTE ONLY): 38 min   Charges:         PT G Codes:   PT G-Codes **NOT FOR INPATIENT CLASS** Functional Assessment Tool Used: AM-PAC 6 Clicks Basic Mobility;Clinical judgement Functional Limitation: Mobility: Walking and moving around Mobility: Walking and Moving Around Current Status (V4098): At least 60 percent but less than 80 percent impaired, limited or restricted Mobility: Walking and Moving Around Goal Status [phone]): At least 40 percent but less than 60 percent impaired, limited or restricted    Beth Valentino Saavedra, PT, DPT X: [phone]

## 2017-01-29 NOTE — Progress Notes (Signed)
Went to set up CPAP machine, pt asleep, talked with pt daughter, she stated pt doen't wear CPAP machine at home , had sleep study done but wasn't given a machine. Pt hadn't had any sleep for the past 2/3 days and wanted to just let her mother sleep for tonight. Machine left outside of pt room and will assist with nasal mask and setup on 01/29/17

## 2017-01-30 ENCOUNTER — Ambulatory Visit (INDEPENDENT_AMBULATORY_CARE_PROVIDER_SITE_OTHER): Payer: Medicare Other | Admitting: Internal Medicine

## 2017-01-30 LAB — CBC WITH DIFFERENTIAL/PLATELET
BASOS PCT: 2 %
Basophils Absolute: 0.1 10*3/uL (ref 0.0–0.1)
EOS PCT: 2 %
Eosinophils Absolute: 0.1 10*3/uL (ref 0.0–0.7)
HEMATOCRIT: 24.6 % — AB (ref 36.0–46.0)
HEMOGLOBIN: 8.5 g/dL — AB (ref 12.0–15.0)
LYMPHS ABS: 0.9 10*3/uL (ref 0.7–4.0)
Lymphocytes Relative: 21 %
MCH: 31.6 pg (ref 26.0–34.0)
MCHC: 34.6 g/dL (ref 30.0–36.0)
MCV: 91.4 fL (ref 78.0–100.0)
MONO ABS: 0.8 10*3/uL (ref 0.1–1.0)
Monocytes Relative: 20 %
Neutro Abs: 2.2 10*3/uL (ref 1.7–7.7)
Neutrophils Relative %: 55 %
Platelets: 228 10*3/uL (ref 150–400)
RBC: 2.69 MIL/uL — ABNORMAL LOW (ref 3.87–5.11)
RDW: 15.4 % (ref 11.5–15.5)
WBC: 4.1 10*3/uL (ref 4.0–10.5)

## 2017-01-30 LAB — GASTROINTESTINAL PANEL BY PCR, STOOL (REPLACES STOOL CULTURE)
ADENOVIRUS F40/41: NOT DETECTED
ASTROVIRUS: NOT DETECTED
CAMPYLOBACTER SPECIES: NOT DETECTED
CRYPTOSPORIDIUM: NOT DETECTED
CYCLOSPORA CAYETANENSIS: NOT DETECTED
ENTAMOEBA HISTOLYTICA: NOT DETECTED
ENTEROAGGREGATIVE E COLI (EAEC): NOT DETECTED
ENTEROPATHOGENIC E COLI (EPEC): NOT DETECTED
ENTEROTOXIGENIC E COLI (ETEC): NOT DETECTED
Giardia lamblia: NOT DETECTED
Norovirus GI/GII: NOT DETECTED
PLESIMONAS SHIGELLOIDES: NOT DETECTED
Rotavirus A: NOT DETECTED
SHIGA LIKE TOXIN PRODUCING E COLI (STEC): NOT DETECTED
SHIGELLA/ENTEROINVASIVE E COLI (EIEC): NOT DETECTED
Salmonella species: NOT DETECTED
Sapovirus (I, II, IV, and V): NOT DETECTED
VIBRIO CHOLERAE: NOT DETECTED
VIBRIO SPECIES: NOT DETECTED
Yersinia enterocolitica: NOT DETECTED

## 2017-01-30 LAB — GLUCOSE, CAPILLARY
GLUCOSE-CAPILLARY: 100 mg/dL — AB (ref 65–99)
GLUCOSE-CAPILLARY: 90 mg/dL (ref 65–99)
Glucose-Capillary: 99 mg/dL (ref 65–99)
Glucose-Capillary: 130 mg/dL — ABNORMAL HIGH (ref 65–99)

## 2017-01-30 LAB — BASIC METABOLIC PANEL
Anion gap: 9 (ref 5–15)
BUN: 17 mg/dL (ref 6–20)
CALCIUM: 7.7 mg/dL — AB (ref 8.9–10.3)
CHLORIDE: 84 mmol/L — AB (ref 101–111)
CO2: 26 mmol/L (ref 22–32)
CREATININE: 2.12 mg/dL — AB (ref 0.44–1.00)
GFR calc Af Amer: 25 mL/min — ABNORMAL LOW (ref >60)
GFR calc non Af Amer: 22 mL/min — ABNORMAL LOW (ref >60)
Glucose, Bld: 91 mg/dL (ref 65–99)
Potassium: 3.9 mmol/L (ref 3.5–5.1)
Sodium: 119 mmol/L — CL (ref 135–145)

## 2017-01-30 LAB — HEMOGLOBIN A1C
HEMOGLOBIN A1C: 8.1 % — AB (ref 4.8–5.6)
MEAN PLASMA GLUCOSE: 186 mg/dL

## 2017-01-30 LAB — SODIUM, URINE, RANDOM: Sodium, Ur: 17 mmol/L

## 2017-01-30 LAB — CREATININE, URINE, RANDOM: Creatinine, Urine: 112.48 mg/dL

## 2017-01-30 LAB — PROTEIN, URINE, RANDOM: Total Protein, Urine: 64 mg/dL

## 2017-01-30 MED ORDER — COLCHICINE 0.6 MG PO TABS: 0.6000 mg | ORAL_TABLET | Freq: Every day | ORAL | Status: DC

## 2017-01-30 MED ORDER — ALBUTEROL SULFATE (2.5 MG/3ML) 0.083% IN NEBU
3.0000 mL | INHALATION_SOLUTION | Freq: Three times a day (TID) | RESPIRATORY_TRACT | Status: DC
  Administered 2017-01-30 – 2017-02-06 (×19): 3 mL via RESPIRATORY_TRACT
  Filled 2017-01-30 (×20): qty 3

## 2017-01-30 MED ORDER — APIXABAN 2.5 MG PO TABS
2.5000 mg | ORAL_TABLET | Freq: Two times a day (BID) | ORAL | Status: DC
  Filled 2017-01-30 (×5): qty 1

## 2017-01-30 MED ORDER — COLCHICINE 0.6 MG PO TABS
0.3000 mg | ORAL_TABLET | Freq: Every day | ORAL | Status: DC
  Administered 2017-02-01 – 2017-02-06 (×6): 0.3 mg via ORAL
  Filled 2017-01-30 (×6): qty 1

## 2017-01-30 MED ORDER — VANCOMYCIN 50 MG/ML ORAL SOLUTION
250.0000 mg | Freq: Four times a day (QID) | ORAL | Status: DC
  Administered 2017-01-31 – 2017-02-03 (×15): 250 mg via ORAL
  Filled 2017-01-30 (×22): qty 5

## 2017-01-30 NOTE — Care Management Note (Signed)
Case Management Note  Patient Details  Name: Yolanda MurrainBrenda W Remo MRN: [id number] Date of Birth: May 13, 1941  Subjective/Objective:   Adm with hyponatremia/AKI. [facility] [place]. Would like to go to a new SNF at time of discharge.                  Action/Plan: CSW aware of admission and making arrangements for SNF return. GI/Nephro consults pending. CM following for needs.    Expected Discharge Date:      02/01/2017           Expected Discharge Plan:  Skilled Nursing Facility  In-House Referral:  Clinical Social Work  Discharge planning Services  CM Consult  Post Acute Care Choice:  NA Choice offered to:  NA  DME Arranged:    DME Agency:     HH Arranged:    HH Agency:     Status of Service:  In process, will continue to follow  If discussed at Long Length of Stay Meetings, dates discussed:    Additional Comments:  Alain Deschene, Chrystine OilerSharley Diane, RN 01/30/2017, 10:55 AM

## 2017-01-30 NOTE — Consult Note (Signed)
Yolanda [name] MRN: [id number] DOB/AGE: 74-11-1940 74 y.o. Primary Care Physician:Hawkins, Ramon Dredge, MD Admit date: 01/28/2017 Chief Complaint:  Chief Complaint  Patient presents with  . Weakness   HPI: Pt is a 76 year old caucasian female with a past medical history significant of Afib who came to ER with c/o weakness.  HPI dates back to 4 weeks ago when pt was hospitalized here for acute encephalopathy secondary  to UTI and cellulitis of the leg. Pt was later was discharged to [facility].  Pt explains "  When I was  there,I lay in one spot for days".   Pt later  went to an appointment with Dr. Juanetta Gosling  and was suggested that they go back and sign her out of [place] and return for admission to [facility]. Upon evaluation in ER pt was found to have   Hyponatremia with  Sodium of 115 , prolonged diarrhea with negative C diff (toxin, Ag was positive)and possiblr cellulitis to left leg anteriorly treated with IV vancomycin Pt seen today in ICU,Pt main concerns is  " I still have diarrhea' "I am very weak"  NO c/o chest pain NO c/o shortness of breath NO c/o fever/cough/chills NO c/o hematuria       Past Medical History:  Diagnosis Date  . Barrett esophagus   . Bronchitis   . Cellulitis   . COPD (chronic obstructive pulmonary disease) (HCC)   . Diastolic heart failure (HCC)   . Essential hypertension   . GERD (gastroesophageal reflux disease)   . Hepatomegaly    ???  . Hypothyroidism   . PAF (paroxysmal atrial fibrillation) (HCC)    Eliquis  . Stage III chronic kidney disease        Family History  Problem Relation Age of Onset  . Dementia Mother   . Lung cancer Father     smoked  . Hypertension Sister   . Diabetes Brother   . Obesity Sister   . Hypertension Sister   . Restless legs syndrome Sister   . Healthy Sister   . Lung cancer Maternal Uncle     Social History:  reports that she quit smoking about 17 years ago. Her smoking use included Cigarettes. She  started smoking about 58 years ago. She has a 15.00 pack-year smoking history. She has never used smokeless tobacco. She reports that she does not drink alcohol or use drugs.   Allergies:  Allergies  Allergen Reactions  . Ciprofloxacin Shortness Of Breath    REACTION: sob,tachycardia  . Doxycycline Nausea Only    Also experienced diarrhea   . Fish Oil     Patient face drew to the side,Bells [place]y  . Guaifenesin Shortness Of Breath    REACTION: sob,tachycardia  . Mucinex [Guaifenesin Er] Shortness Of Breath  . Sulfamethoxazole W/Trimethoprim 800-160 [Sulfamethoxazole-Trimethoprim] Nausea Only    Also lack of appetite   . Uloric [Febuxostat] Swelling    No urination  . Adhesive [Tape] Other (See Comments)    Causes blisters on skin  . Allopurinol Other (See Comments)    Couldn't urinate  . Cefuroxime Axetil Swelling    Swelling all over body-per patient she was hospitalized as a result of taking this medication  . Metolazone Nausea Only    Swelling   . Tramadol     insomnia    Medications Prior to Admission  Medication Sig Dispense Refill  . acetaminophen (TYLENOL) 325 MG tablet Take 2 tablets (650 mg total) by mouth every 6 (six) hours  as needed for mild pain (or Fever >/= 101).    Marland Kitchen albuterol (PROVENTIL HFA;VENTOLIN HFA) 108 (90 Base) MCG/ACT inhaler Inhale 2 puffs into the lungs 4 (four) times daily.     Marland Kitchen apixaban (ELIQUIS) 5 MG TABS tablet Take 1 tablet (5 mg total) by mouth 2 (two) times daily. 60 tablet 5  . calcium carbonate (TUMS - DOSED IN MG ELEMENTAL CALCIUM) 500 MG chewable tablet Chew 1 tablet by mouth 2 (two) times daily.    . cetirizine (ZYRTEC) 10 MG tablet Take 10 mg by mouth daily.    . cholecalciferol (VITAMIN D) 1000 units tablet Take 1,000 Units by mouth every morning.    . cholecalciferol (VITAMIN D) 400 units TABS tablet Take 400 Units by mouth daily.    . colchicine 0.6 MG tablet Take 0.6 mg by mouth 2 (two) times daily.    Marland Kitchen diltiazem (CARDIZEM CD)  120 MG 24 hr capsule Take 1 capsule (120 mg total) by mouth daily. 90 capsule 3  . fluticasone furoate-vilanterol (BREO ELLIPTA) 200-25 MCG/INH AEPB Inhale 1 puff into the lungs daily.    Marland Kitchen gabapentin (NEURONTIN) 100 MG capsule Take 100 mg by mouth 2 (two) times daily.    Marland Kitchen HYDROcodone-acetaminophen (NORCO) 7.5-325 MG tablet Take 1 tablet by mouth 3 (three) times daily as needed for moderate pain.    Marland Kitchen insulin glargine (LANTUS) 100 UNIT/ML injection Inject 0.2 mLs (20 Units total) into the skin daily. 10 mL 11  . insulin lispro (HUMALOG) 100 UNIT/ML injection Inject 3-20 Units into the skin 3 (three) times daily before meals. 70-100= 0 units 101-150=3 units 151-200=4 units 201-250=8 units 251-300=12units 301-350=16units Greater than 350= 20 units    . levothyroxine (SYNTHROID, LEVOTHROID) 137 MCG tablet Take 137 mcg by mouth daily before breakfast.    . Magnesium 250 MG TABS Take 2 tablets by mouth 2 (two) times daily.     . metoprolol tartrate (LOPRESSOR) 25 MG tablet TAKE 1/2 TABLET BY MOUTH TWICE DAILY 90 tablet 1  . Multiple Vitamin (MULTIVITAMIN WITH MINERALS) TABS tablet Take 1 tablet by mouth daily.    Marland Kitchen omeprazole (PRILOSEC) 20 MG capsule TAKE ONE CAPSULE BY MOUTH DAILY 90 capsule 3  . ondansetron (ZOFRAN) 4 MG tablet Take 1 tablet (4 mg total) by mouth every 6 (six) hours as needed for nausea. 20 tablet 0  . potassium chloride SA (K-DUR,KLOR-CON) 20 MEQ tablet Take 1 tablet (20 mEq total) by mouth daily.    Marland Kitchen torsemide (DEMADEX) 20 MG tablet Take 1 tablet (20 mg total) by mouth 2 (two) times daily. (may take an extra 20mg  as needed)    . triamcinolone (NASACORT ALLERGY 24HR) 55 MCG/ACT AERO nasal inhaler Place 2 sprays into the nose daily as needed (for allergies).     . feeding supplement, GLUCERNA SHAKE, (GLUCERNA SHAKE) LIQD Take 237 mLs by mouth 3 (three) times daily between meals.  0  . gabapentin (NEURONTIN) 300 MG capsule Take 1 capsule (300 mg total) by mouth 2 (two) times  daily. (Patient not taking: Reported on 01/28/2017)    . polyvinyl alcohol (LIQUIFILM TEARS) 1.4 % ophthalmic solution Place 1 drop into both eyes 4 (four) times daily as needed for dry eyes. 15 mL 0       HYQ:MVHQI from the symptoms mentioned above,there are no other symptoms referable to all systems reviewed.  Marland Kitchen albuterol  3 mL Inhalation TID  . colchicine  0.3 mg Oral Daily  . docusate sodium  100 mg Oral  BID  . feeding supplement (GLUCERNA SHAKE)  237 mL Oral BID BM  . fluticasone furoate-vilanterol  1 puff Inhalation Daily  . gabapentin  300 mg Oral BID  . triamcinolone cream   Topical BID   And  . hydrocerin   Topical BID  . insulin aspart  0-15 Units Subcutaneous TID WC  . insulin glargine  20 Units Subcutaneous Daily  . levothyroxine  137 mcg Oral QAC breakfast  . loratadine  10 mg Oral Daily  . metoprolol tartrate  12.5 mg Oral BID  . pantoprazole  40 mg Oral Daily  . sodium chloride flush  3 mL Intravenous Q12H  . vancomycin  250 mg Oral Q6H        Physical Exam: Vital signs in last 24 hours: Temp:  [98 F (36.7 C)-98.4 F (36.9 C)] 98 F (36.7 C) (05/09 1105) Pulse Rate:  [85-104] 104 (05/09 1300) Resp:  [12-18] 12 (05/09 1300) BP: (87-129)/(41-107) 112/99 (05/09 1100) SpO2:  [96 %-100 %] 96 % (05/09 1300) Weight:  [216 lb 14.9 oz (98.4 kg)] 216 lb 14.9 oz (98.4 kg) (05/09 0500) Weight change: 6 lb 14.9 oz (3.145 kg) Last BM Date: 01/30/17  Intake/Output from previous day: 05/08 0701 - 05/09 0700 In: 1134.6 [P.O.:360; I.V.:774.6] Out: 400 [Urine:300; Emesis/NG output:100] No intake/output data recorded.   Physical Exam: General- pt is awake,alert, oriented to time place and person Resp- No acute REsp distress, NO Rhonchi CVS- S1S2 regular ij rate and rhythm GIT- BS+, soft, NT, ND EXT- NO LE Edema, NO Cyanosis CNS- CN 2-12 grossly intact. Moving all 4 extremities Psych- normal mood and affect   Lab Results: CBC  Recent Labs  01/29/17 0327  01/30/17 0421  WBC 4.7 4.1  HGB 9.4* 8.5*  HCT 26.7* 24.6*  PLT 231 228    BMET  Recent Labs  01/29/17 1557 01/30/17 0421  NA 117* 119*  K 4.2 3.9  CL 81* 84*  CO2 25 26  GLUCOSE 165* 91  BUN 18 17  CREATININE 1.60* 2.12*  CALCIUM 8.0* 7.7*    Creat trend 2018  1.08=> 2.1 2017  1.1--1.8 2016   1.0--1.5 2015    0.9--1.1   Sodium trend 2018 115=>119  2017  120--134    MICRO Recent Results (from the past 240 hour(s))  C difficile quick scan w PCR reflex     Status: Abnormal   Collection Time: 01/28/17  4:53 PM  Result Value Ref Range Status   C Diff antigen POSITIVE (A) NEGATIVE Final   C Diff toxin NEGATIVE NEGATIVE Final   C Diff interpretation Results are indeterminate. See PCR results.  Final  Clostridium Difficile by PCR     Status: None   Collection Time: 01/28/17  4:53 PM  Result Value Ref Range Status   Toxigenic C Difficile by pcr NEGATIVE NEGATIVE Final    Comment: Patient is colonized with non toxigenic C. difficile. May not need treatment unless significant symptoms are present. Performed at [facility], [address], [place], [place] [address]   MRSA PCR Screening     Status: None   Collection Time: 01/28/17 11:24 PM  Result Value Ref Range Status   MRSA by PCR NEGATIVE NEGATIVE Final    Comment:        The GeneXpert MRSA Assay (FDA approved for NASAL specimens only), is one component of a comprehensive MRSA colonization surveillance program. It is not intended to diagnose MRSA infection nor to guide or monitor treatment  for MRSA infections.   Gastrointestinal Panel by PCR , Stool     Status: None   Collection Time: 01/29/17  3:15 PM  Result Value Ref Range Status   Campylobacter species NOT DETECTED NOT DETECTED Final   Plesimonas shigelloides NOT DETECTED NOT DETECTED Final   Salmonella species NOT DETECTED NOT DETECTED Final   Yersinia enterocolitica NOT DETECTED NOT DETECTED Final   Vibrio species NOT DETECTED NOT  DETECTED Final   Vibrio cholerae NOT DETECTED NOT DETECTED Final   Enteroaggregative E coli (EAEC) NOT DETECTED NOT DETECTED Final   Enteropathogenic E coli (EPEC) NOT DETECTED NOT DETECTED Final   Enterotoxigenic E coli (ETEC) NOT DETECTED NOT DETECTED Final   Shiga like toxin producing E coli (STEC) NOT DETECTED NOT DETECTED Final   Shigella/Enteroinvasive E coli (EIEC) NOT DETECTED NOT DETECTED Final   Cryptosporidium NOT DETECTED NOT DETECTED Final   Cyclospora cayetanensis NOT DETECTED NOT DETECTED Final   Entamoeba histolytica NOT DETECTED NOT DETECTED Final   Giardia lamblia NOT DETECTED NOT DETECTED Final   Adenovirus F40/41 NOT DETECTED NOT DETECTED Final   Astrovirus NOT DETECTED NOT DETECTED Final   Norovirus GI/GII NOT DETECTED NOT DETECTED Final   Rotavirus A NOT DETECTED NOT DETECTED Final   Sapovirus (I, II, IV, and V) NOT DETECTED NOT DETECTED Final      Lab Results  Component Value Date   CALCIUM 7.7 (L) 01/30/2017   CAION 1.29 11/06/2014   Renal U/s done in April  2018 Right Kidney:10.5 cm. Cortical thinning. Normal echotexture. No hydronephrosis or mass.  Left Kidney: 10.0 cm. Cortical thinning. Normal echotexture. No hydronephrosis or mass.  Bladder:  Appears normal for degree of bladder distention.  IMPRESSION: No acute findings.  Mild cortical thinning.  No hydronephrosis.  CT abdo in May  2018 Adrenals/Urinary Tract: Adrenal glands are unremarkable. Kidneys are normal, without renal calculi, focal lesion, or hydronephrosis. Bladder is unremarkable.  CXR Minimal bibasilar atelectasis noted.  Lungs otherwise clear.   Impression: 1)Renal  AKI secondary to prerenal/ATN                AKI sec to Hypovolemia/Hypotension                AKI now worseing                AKI on CKD               CKD stage 3.               CKD since 2015               CKD secondary to HTN/ Multiple AKI                Progression of CKD marked with AKI                  Proteinura will check                  2)CVS- admitted with Hypotension  3)Anemia HGb trending low  4)Id-admitted with C diff and cellulitis On IV Abx   5)Afib-Primary MD following  6)Hyponatremia      Hypovolemic hyponatremia       Data in favor       Low urine na       High Urine osmolarity   7)Acid base Co2 at goal     Plan:  Agree with IVF. Will ask for FENA. Agree with holding one  anti HTN . Will follow bmet.      Yehya Brendle S 01/30/2017, 1:53 PM

## 2017-01-30 NOTE — Progress Notes (Signed)
Physical Therapy Treatment Patient Details Name: Yolanda [name] MRN: [id number] DOB: Sep 12, 1941 Today's Date: 01/30/2017    History of Present Illness 76 y.o. female with medical history significant of Afib on Eliquis, Stage 3 CKD, hypothyroidism, GERD, HTN, diastolic heart failure, and COPD.  She was hospitalized here from 4/8-16 for acute encephalopathy thought to be related to UTI and cellulitis of the leg; she was discharged to [facility].  While there, "it's been a long 3 weeks".  She reports laying in one spot for days.  She was still having extreme pain that she had while here, so they were unable  to start rehab.  They gave her hydrocodone to the point that she had tremors and was out of it.  She diagnosed herself with gouty attack; sister went to see Dr. Olena Leatherwood and he prescribed a new medication.  They were 2 weeks into the rehab by the time she was better.  This week, she was able to assist herself in the shower.  She has only taken a few steps in the whole month.  This weekend, she had projectile vomiting and diarrhea.  Vision was increasingly blurred, difficulty focusing.  Xanax at home was previously prn insomnia and she rarely took it, but she has been getting it BID at rehab.  Xanax was discontinued 2 days ago.  Vision is slightly better but not good.  Ongoing diarrhea, no further vomiting.  She went to an appointment with Dr. Juanetta Gosling today and he suggested that they go back and sign her out of [place] and return for admission to [facility].  Dx: Hyponatremia, and L LE cellulitis.     PT Comments    Pt received in bed, and was agreeable to PT tx.  Pt expressed that she continues to be nauseated, and have diarrhea.  Pt demonstrated improved bed mobility, and only required Min A for supine<>sit.  She continues to required increased assistance - min/mod A with RW for SPT bed<>BSC.  She continues to demonstrate poor posture with poor hip extension.  After she was finished using the [facility],  she was able to take several steps to turn 180* to sit in the chair.  Pt becomes very anxious when she is transferring, and requires constant encouragement to continue turning to avoid pre-mature sitting.  Continue to recommend SNF upon d/c.  Pt is agreeable, but does not want [facility].      Follow Up Recommendations  SNF     Equipment Recommendations  None recommended by PT    Recommendations for Other Services       Precautions / Restrictions Precautions Precautions: Fall Precaution Comments: 3 falls - at church, down the steps, and then just prior to admission - fell off a stool in the bathroom.  Restrictions Weight Bearing Restrictions: No    Mobility  Bed Mobility Overal bed mobility: Needs Assistance Bed Mobility: Supine to Sit     Supine to sit: HOB elevated;Min assist     General bed mobility comments: vc's for rolling to his side and assistance to raise trunk up.    Transfers Overall transfer level: Needs assistance Equipment used: Rolling walker (2 wheeled) Transfers: Sit to/from [employer] Sit to Stand: Min assist;+2 physical assistance;+2 safety/equipment (vc's for hand placement) Stand pivot transfers: Min assist;Mod assist (Pt requires vc's for upright posture during transfer, and to reach back for the [facility]  )       General transfer comment: After using the [facility], she required assistance for  hygiene.  Pt then was able to transfer BSC<>chair but requires increased encouragement due to some anxiety/fear during transfer, also requires cues to turn all the way around and avoid pre-mature sitting.   Ambulation/Gait                 Stairs            Wheelchair Mobility    Modified Rankin (Stroke Patients Only)       Balance Overall balance assessment: History of Falls;Needs assistance Sitting-balance support: Bilateral upper extremity supported;Feet supported Sitting balance-Leahy Scale: Good     Standing balance support:  Bilateral upper extremity supported Standing balance-Leahy Scale: Poor                              Cognition Arousal/Alertness: Awake/alert Behavior During Therapy: WFL for tasks assessed/performed Overall Cognitive Status: Within Functional Limits for tasks assessed                                        Exercises      General Comments        Pertinent Vitals/Pain Pain Assessment: No/denies pain (Pt c/o nausea Simultaneous filing. User may not have seen previous data.)    Home Living Family/patient expects to be discharged to:: Skilled nursing facility Living Arrangements: Alone ([facility])   Type of Home: House Home Access: Stairs to enter Entrance Stairs-Rails: None Home Layout: One level Home Equipment: Cane - single point      Prior Function Level of Independence: Independent with assistive device(s)  Gait / Transfers Assistance Needed: Pt states that she has not been able to do her [facility] due to pain in her LE's.  She states that she has laid in the bed for the past month, and has occasionally be able to transfer into a chair for showering via lift.  Prior to her admission in April she states she occasionally used the cane for ambulation  ADL's / Homemaking Assistance Needed: For the past month, she has been [facility] and has required assistance for dressing and bathing.  Prior to her admission in April, she was independent with ADL's.  Comments: Prior to admission in April she was driving, and was able to ambulate community distances.     PT Goals (current goals can now be found in the care plan section) Acute Rehab PT Goals Patient Stated Goal: To get stronger PT Goal Formulation: With patient/family Time For Goal Achievement: 02/12/17 Potential to Achieve Goals: Good Progress towards PT goals: Progressing toward goals    Frequency    Min 6X/week      PT Plan  Current plan remains appropriate    Co-evaluation PT/OT/SLP Co-Evaluation/Treatment: Yes Reason for Co-Treatment: Complexity of the patient's impairments (multi-system involvement);For patient/therapist safety PT goals addressed during session: Mobility/safety with mobility;Balance;Proper use of DME;Strengthening/ROM OT goals addressed during session: ADL's and self-care;Strengthening/ROM      AM-PAC PT "6 Clicks" Daily Activity  Outcome Measure  Difficulty turning over in bed (including adjusting bedclothes, sheets and blankets)?: A Little Difficulty moving from lying on back to sitting on the side of the bed? : A Little Difficulty sitting down on and standing up from a chair with arms (e.g., wheelchair, bedside commode, etc,.)?: A Lot Help needed moving to and from a  bed to chair (including a wheelchair)?: A Lot Help needed walking in hospital room?: A Lot Help needed climbing 3-5 steps with a railing? : Total 6 Click Score: 13    End of Session Equipment Utilized During Treatment: Gait belt;Oxygen Activity Tolerance: Patient limited by fatigue Patient left: in chair;with call bell/phone within reach Nurse Communication: Mobility status PT Visit Diagnosis: Other abnormalities of gait and mobility (R26.89);Muscle weakness (generalized) (M62.81);History of falling (Z91.81);Difficulty in walking, not elsewhere classified (R26.2);Unsteadiness on feet (R26.81)     Time: 0900-0930 PT Time Calculation (min) (ACUTE ONLY): 30 min  Charges:  $Therapeutic Activity: 8-22 mins                    G Codes:       Beth Montae Stager, PT, DPT X: [phone]

## 2017-01-30 NOTE — Progress Notes (Signed)
CRITICAL VALUE ALERT  Critical value received:  NA 119  Date of notification:  01/30/17  Time of notification:  0543  Critical value read back: yes  Nurse who received alert:  Loney LaurenceJ Barnett Elzey   MD notified (1st page):  R. Onalee Huaavid  Time of first page:  0540  MD notified (2nd page):  Time of second page:  Responding MD:  Vania Rea. David  Time MD responded:  [phone]   No changes to care, continue to monitor

## 2017-01-30 NOTE — Evaluation (Signed)
Occupational Therapy Evaluation Patient Details Name: Yolanda [name] MRN: [id number] DOB: April 03, 1941 Today's Date: 01/30/2017    History of Present Illness 76 y.o. female with medical history significant of Afib on Eliquis, Stage 3 CKD, hypothyroidism, GERD, HTN, diastolic heart failure, and COPD.  She was hospitalized here from 4/8-16 for acute encephalopathy thought to be related to UTI and cellulitis of the leg; she was discharged to [facility].  While there, "it's been a long 3 weeks".  She reports laying in one spot for days.  She was still having extreme pain that she had while here, so they were unable  to start rehab.  They gave her hydrocodone to the point that she had tremors and was out of it.  She diagnosed herself with gouty attack; sister went to see Dr. Olena Leatherwood and he prescribed a new medication.  They were 2 weeks into the rehab by the time she was better.  This week, she was able to assist herself in the shower.  She has only taken a few steps in the whole month.  This weekend, she had projectile vomiting and diarrhea.  Vision was increasingly blurred, difficulty focusing.  Xanax at home was previously prn insomnia and she rarely took it, but she has been getting it BID at rehab.  Xanax was discontinued 2 days ago.  Vision is slightly better but not good.  Ongoing diarrhea, no further vomiting.  She went to an appointment with Dr. Juanetta Gosling today and he suggested that they go back and sign her out of [place] and return for admission to [facility].  Dx: Hyponatremia, and L LE cellulitis.    Clinical Impression   Pt is in bed upon therapy arrival and agreeable to participate in OT evaluation. Pt presents with decreased BUE strength and activity tolerance resulting in increased need for assistance for functional transfers and ADL performance. Skilled OT services required to increased the mentioned deficits. Recommend SNF at discharge.    Follow Up Recommendations  SNF    Equipment  Recommendations  None recommended by OT       Precautions / Restrictions Precautions Precautions: Fall Precaution Comments: 3 falls - at church, down the steps, and then just prior to admission - fell off a stool in the bathroom.  Restrictions Weight Bearing Restrictions: No      Mobility     Transfers                 General transfer comment: See PT treatment note for transfer performance.        ADL either performed or assessed with clinical judgement   ADL Overall ADL's : Needs assistance/impaired                     Lower Body Dressing: Total assistance;Bed level Lower Body Dressing Details (indicate cue type and reason): donning hospital socks. Toilet Transfer: Moderate assistance;+2 for safety/equipment;BSC;RW   Toileting- Clothing Manipulation and Hygiene: Total assistance;+2 for safety/equipment;Sit to/from stand                            Pertinent Vitals/Pain Pain Assessment: No/denies pain (Pt c/o nausea Simultaneous filing. User may not have seen previous data.)     Hand Dominance Right   Extremity/Trunk Assessment Upper Extremity Assessment Upper Extremity Assessment: Generalized weakness RUE Deficits / Details: A/ROM [facility] Strength: 3+/5 shoulder and elbow ranges. Decreased gross grasp. LUE Deficits / Details: A/ROM WFL . Strength:  3/5 shoulder and elbow ranges. Decreased gross grasp.   Lower Extremity Assessment Lower Extremity Assessment: Defer to PT evaluation       Communication Communication Communication: No difficulties   Cognition Arousal/Alertness: Awake/alert Behavior During Therapy: WFL for tasks assessed/performed Overall Cognitive Status: Within Functional Limits for tasks assessed                                                Home Living Family/patient expects to be discharged to:: Skilled nursing facility Living Arrangements: Alone (Has been at [facility] since recent admission)    Type of Home: House Home Access: Stairs to enter [employer] of Steps: 4-5 Entrance Stairs-Rails: None Home Layout: One level     Bathroom Shower/Tub: Walk-in shower;Tub/shower unit   Bathroom Toilet: Standard     Home Equipment: Cane - single point          Prior Functioning/Environment Level of Independence: Independent with assistive device(s)  Gait / Transfers Assistance Needed: Pt states that she has not been able to do her therapy at [facility] due to pain in her LE's.  She states that she has laid in the bed for the past month, and has occasionally be able to transfer into a chair for showering via lift.  Prior to her admission in April she states she occasionally used the cane for ambulation  ADL's / Homemaking Assistance Needed: For the past month, she has been at [facility] and has required assistance for dressing and bathing.  Prior to her admission in April, she was independent with ADL's.    Comments: Prior to admission in April she was driving, and was able to ambulate community distances.          OT Problem List: Decreased strength;Decreased activity tolerance;Impaired balance (sitting and/or standing);Decreased knowledge of use of DME or AE;Impaired UE functional use      OT Treatment/Interventions: Self-care/ADL training;Therapeutic exercise;Therapeutic activities;Energy conservation;DME and/or AE instruction;Balance training;Patient/family education    OT Goals(Current goals can be found in the care plan section) Acute Rehab OT Goals Patient Stated Goal: To get stronger OT Goal Formulation: With patient Time For Goal Achievement: 02/13/17 Potential to Achieve Goals: Good  OT Frequency: Min 2X/week           Co-evaluation PT/OT/SLP Co-Evaluation/Treatment: Yes Reason for Co-Treatment: Complexity of the patient's impairments (multi-system involvement);For patient/therapist safety   OT goals addressed during session: ADL's and  self-care;Strengthening/ROM      AM-PAC PT "6 Clicks" Daily Activity     Outcome Measure Help from another person eating meals?: A Lot Help from another person taking care of personal grooming?: A Lot Help from another person toileting, which includes using toliet, bedpan, or urinal?: Total Help from another person bathing (including washing, rinsing, drying)?: Total Help from another person to put on and taking off regular upper body clothing?: A Lot Help from another person to put on and taking off regular lower body clothing?: Total 6 Click Score: 9   End of Session Equipment Utilized During Treatment: Gait belt;Rolling walker  Activity Tolerance: Patient tolerated treatment well Patient left: in chair;with call bell/phone within reach  OT Visit Diagnosis: Muscle weakness (generalized) (M62.81)                Time: 0900-0930 OT Time Calculation (min): 30 min Charges:  OT General Charges $OT Visit:  1 Procedure OT Evaluation $OT Eval Low Complexity: 1 Procedure G-Codes: OT G-codes **NOT FOR INPATIENT CLASS** Functional Assessment Tool Used: AM-PAC 6 Clicks Daily Activity Functional Limitation: Self care Self Care Current Status ([id number]): At least 60 percent but less than 80 percent impaired, limited or restricted Self Care Goal Status ([id number]): At least 40 percent but less than 60 percent impaired, limited or restricted   Yolanda [name], OTR/L,CBIS  [id number] [phone]   Kenneshia Rehm, Charisse MarchLaura D 01/30/2017, 9:43 AM

## 2017-01-30 NOTE — Progress Notes (Signed)
Subjective: She says she doesn't feel well. She still having trouble tolerating clindamycin. She is having diarrhea. Some aching.  Objective: Vital signs in last 24 hours: Temp:  [98.2 F (36.8 C)-98.7 F (37.1 C)] 98.3 F (36.8 C) (05/09 0400) Pulse Rate:  [85-101] 85 (05/08 2259) Resp:  [12-21] 15 (05/08 1500) BP: (79-131)/(32-76) 101/41 (05/08 2120) SpO2:  [94 %-100 %] 97 % (05/08 2259) Weight:  [98.4 kg (216 lb 14.9 oz)] 98.4 kg (216 lb 14.9 oz) (05/09 0500) Weight change: 3.145 kg (6 lb 14.9 oz) Last BM Date: 01/29/17  Intake/Output from previous day: 05/08 0701 - 05/09 0700 In: 1134.6 [P.O.:360; I.V.:774.6] Out: 400 [Urine:300; Emesis/NG output:100]  PHYSICAL EXAM General appearance: alert, cooperative, mild distress and morbidly obese Resp: clear to auscultation bilaterally Cardio: regular rate and rhythm, S1, S2 normal, no murmur, click, rub or gallop GI: Soft minimally tender somewhat hyperactive bowel sounds Extremities: Area of cellulitis on her left leg looks better Skin warm and dry  Lab Results:  Results for orders placed or performed during the hospital encounter of 01/28/17 (from the past 48 hour(s))  Urinalysis, Routine w reflex microscopic     Status: Abnormal   Collection Time: 01/28/17  4:26 PM  Result Value Ref Range   Color, Urine YELLOW YELLOW   APPearance TURBID (A) CLEAR   Specific Gravity, Urine 1.004 (L) 1.005 - 1.030   pH 5.0 5.0 - 8.0   Glucose, UA NEGATIVE NEGATIVE mg/dL   Hgb urine dipstick MODERATE (A) NEGATIVE   Bilirubin Urine NEGATIVE NEGATIVE   Ketones, ur NEGATIVE NEGATIVE mg/dL   Protein, ur 100 (A) NEGATIVE mg/dL   Nitrite NEGATIVE NEGATIVE   Leukocytes, UA MODERATE (A) NEGATIVE   RBC / HPF 0-5 0 - 5 RBC/hpf   WBC, UA 6-30 0 - 5 WBC/hpf   Bacteria, UA RARE (A) NONE SEEN   Squamous Epithelial / LPF 0-5 (A) NONE SEEN   Hyaline Casts, UA PRESENT    Non Squamous Epithelial 0-5 (A) NONE SEEN  C difficile quick scan w PCR reflex      Status: Abnormal   Collection Time: 01/28/17  4:53 PM  Result Value Ref Range   C Diff antigen POSITIVE (A) NEGATIVE   C Diff toxin NEGATIVE NEGATIVE   C Diff interpretation Results are indeterminate. See PCR results.   Clostridium Difficile by PCR     Status: None   Collection Time: 01/28/17  4:53 PM  Result Value Ref Range   Toxigenic C Difficile by pcr NEGATIVE NEGATIVE    Comment: Patient is colonized with non toxigenic C. difficile. May not need treatment unless significant symptoms are present. Performed at [facility], [place] [address], [place], [place] [address]   CBC with Differential/Platelet     Status: Abnormal   Collection Time: 01/28/17  5:17 PM  Result Value Ref Range   WBC 5.6 4.0 - 10.5 K/uL   RBC 3.07 (L) 3.87 - 5.11 MIL/uL   Hemoglobin 9.7 (L) 12.0 - 15.0 g/dL   HCT 27.9 (L) 36.0 - 46.0 %   MCV 90.9 78.0 - 100.0 fL   MCH 31.6 26.0 - 34.0 pg   MCHC 34.8 30.0 - 36.0 g/dL   RDW 15.1 11.5 - 15.5 %   Platelets 280 150 - 400 K/uL    Comment: SPECIMEN CHECKED FOR CLOTS GIANT PLATELETS SEEN PLATELET COUNT CONFIRMED BY SMEAR    Neutrophils Relative % 68 %   Neutro Abs 3.8 1.7 - 7.7 K/uL  Lymphocytes Relative 13 %   Lymphs Abs 0.7 0.7 - 4.0 K/uL   Monocytes Relative 17 %   Monocytes Absolute 0.9 0.1 - 1.0 K/uL   Eosinophils Relative 2 %   Eosinophils Absolute 0.1 0.0 - 0.7 K/uL   Basophils Relative 1 %   Basophils Absolute 0.0 0.0 - 0.1 K/uL   WBC Morphology WHITE COUNT CONFIRMED ON SMEAR    RBC Morphology RARE NRBCs     Comment: BASOPHILIC STIPPLING  Lipase, blood     Status: None   Collection Time: 01/28/17  5:17 PM  Result Value Ref Range   Lipase 12 11 - 51 U/L  Comprehensive metabolic panel     Status: Abnormal   Collection Time: 01/28/17  5:17 PM  Result Value Ref Range   Sodium 115 (LL) 135 - 145 mmol/L    Comment: CRITICAL RESULT CALLED TO, READ BACK BY AND VERIFIED WITH: CARDWELL,L ON 01/28/17 AT 1825 BY LOY,C    Potassium 4.4 3.5 - 5.1  mmol/L   Chloride 77 (L) 101 - 111 mmol/L   CO2 29 22 - 32 mmol/L   Glucose, Bld 112 (H) 65 - 99 mg/dL   BUN 18 6 - 20 mg/dL   Creatinine, Ser 1.27 (H) 0.44 - 1.00 mg/dL   Calcium 8.4 (L) 8.9 - 10.3 mg/dL   Total Protein 5.5 (L) 6.5 - 8.1 g/dL   Albumin 2.3 (L) 3.5 - 5.0 g/dL   AST 49 (H) 15 - 41 U/L   ALT 53 14 - 54 U/L   Alkaline Phosphatase 206 (H) 38 - 126 U/L   Total Bilirubin 2.6 (H) 0.3 - 1.2 mg/dL   GFR calc non Af Amer 41 (L) >60 mL/min   GFR calc Af Amer 47 (L) >60 mL/min    Comment: (NOTE) The eGFR has been calculated using the CKD EPI equation. This calculation has not been validated in all clinical situations. eGFR's persistently <60 mL/min signify possible Chronic Kidney Disease.    Anion gap 9 5 - 15  Lactic acid, plasma     Status: None   Collection Time: 01/28/17  5:17 PM  Result Value Ref Range   Lactic Acid, Venous 1.0 0.5 - 1.9 mmol/L  Brain natriuretic peptide     Status: None   Collection Time: 01/28/17  5:17 PM  Result Value Ref Range   B Natriuretic Peptide 80.0 0.0 - 100.0 pg/mL  Troponin I     Status: None   Collection Time: 01/28/17  5:17 PM  Result Value Ref Range   Troponin I <0.03 <0.03 ng/mL  TSH     Status: Abnormal   Collection Time: 01/28/17  5:17 PM  Result Value Ref Range   TSH 5.186 (H) 0.350 - 4.500 uIU/mL    Comment: Performed by a 3rd Generation assay with a functional sensitivity of <=0.01 uIU/mL.  Hemoglobin A1c     Status: Abnormal   Collection Time: 01/28/17  5:17 PM  Result Value Ref Range   Hgb A1c MFr Bld 8.1 (H) 4.8 - 5.6 %    Comment: (NOTE)         Pre-diabetes: 5.7 - 6.4         Diabetes: >6.4         Glycemic control for adults with diabetes: <7.0    Mean Plasma Glucose 186 mg/dL    Comment: (NOTE) Performed At: [facility] [address] [place], [place] [id number] Lindon Romp MD AS:[id number]   Basic metabolic panel  Status: Abnormal   Collection Time: 01/28/17  9:03 PM  Result Value Ref  Range   Sodium 116 (LL) 135 - 145 mmol/L    Comment: CRITICAL RESULT CALLED TO, READ BACK BY AND VERIFIED WITH: CRABTREE,B ON 01/28/17 AT 2155 BY LOY,C    Potassium 4.2 3.5 - 5.1 mmol/L   Chloride 78 (L) 101 - 111 mmol/L   CO2 28 22 - 32 mmol/L   Glucose, Bld 96 65 - 99 mg/dL   BUN 18 6 - 20 mg/dL   Creatinine, Ser 1.28 (H) 0.44 - 1.00 mg/dL   Calcium 8.3 (L) 8.9 - 10.3 mg/dL   GFR calc non Af Amer 40 (L) >60 mL/min   GFR calc Af Amer 47 (L) >60 mL/min    Comment: (NOTE) The eGFR has been calculated using the CKD EPI equation. This calculation has not been validated in all clinical situations. eGFR's persistently <60 mL/min signify possible Chronic Kidney Disease.    Anion gap 10 5 - 15  T4, free     Status: Abnormal   Collection Time: 01/28/17  9:10 PM  Result Value Ref Range   Free T4 1.39 (H) 0.61 - 1.12 ng/dL    Comment: (NOTE) Biotin ingestion may interfere with free T4 tests. If the results are inconsistent with the TSH level, previous test results, or the clinical presentation, then consider biotin interference. If needed, order repeat testing after stopping biotin. Performed at [facility], [place] [address], [place], [place] [address]   Uric acid     Status: Abnormal   Collection Time: 01/28/17  9:10 PM  Result Value Ref Range   Uric Acid, Serum 14.6 (H) 2.3 - 6.6 mg/dL  Osmolality     Status: Abnormal   Collection Time: 01/28/17  9:10 PM  Result Value Ref Range   Osmolality 251 (L) 275 - 295 mOsm/kg    Comment: Performed at [facility], [place] [address], [place], [place] [address]  MRSA PCR Screening     Status: None   Collection Time: 01/28/17 11:24 PM  Result Value Ref Range   MRSA by PCR NEGATIVE NEGATIVE    Comment:        The GeneXpert MRSA Assay (FDA approved for NASAL specimens only), is one component of a comprehensive MRSA colonization surveillance program. It is not intended to diagnose MRSA infection nor to guide or monitor treatment  for MRSA infections.   Basic metabolic panel     Status: Abnormal   Collection Time: 01/28/17 11:39 PM  Result Value Ref Range   Sodium 117 (LL) 135 - 145 mmol/L    Comment: CRITICAL RESULT CALLED TO, READ BACK BY AND VERIFIED WITH: FREEMAN,L. AT 3244 ON 01/29/2017 BY EVA    Potassium 4.3 3.5 - 5.1 mmol/L   Chloride 80 (L) 101 - 111 mmol/L   CO2 27 22 - 32 mmol/L   Glucose, Bld 90 65 - 99 mg/dL   BUN 18 6 - 20 mg/dL   Creatinine, Ser 1.26 (H) 0.44 - 1.00 mg/dL   Calcium 8.4 (L) 8.9 - 10.3 mg/dL   GFR calc non Af Amer 41 (L) >60 mL/min   GFR calc Af Amer 48 (L) >60 mL/min    Comment: (NOTE) The eGFR has been calculated using the CKD EPI equation. This calculation has not been validated in all clinical situations. eGFR's persistently <60 mL/min signify possible Chronic Kidney Disease.    Anion gap 10 5 - 15  Glucose, capillary  Status: None   Collection Time: 01/28/17 11:48 PM  Result Value Ref Range   Glucose-Capillary 96 65 - 99 mg/dL   Comment 1 Notify RN   Basic metabolic panel     Status: Abnormal   Collection Time: 01/29/17  3:27 AM  Result Value Ref Range   Sodium 117 (LL) 135 - 145 mmol/L    Comment: CRITICAL RESULT CALLED TO, READ BACK BY AND VERIFIED WITH: DANIELS,J AT 4:10AM ON 01/29/17 BY FESTERMAN,C    Potassium 4.0 3.5 - 5.1 mmol/L   Chloride 82 (L) 101 - 111 mmol/L   CO2 25 22 - 32 mmol/L   Glucose, Bld 84 65 - 99 mg/dL   BUN 18 6 - 20 mg/dL   Creatinine, Ser 1.24 (H) 0.44 - 1.00 mg/dL   Calcium 8.2 (L) 8.9 - 10.3 mg/dL   GFR calc non Af Amer 42 (L) >60 mL/min   GFR calc Af Amer 49 (L) >60 mL/min    Comment: (NOTE) The eGFR has been calculated using the CKD EPI equation. This calculation has not been validated in all clinical situations. eGFR's persistently <60 mL/min signify possible Chronic Kidney Disease.    Anion gap 10 5 - 15  Hepatic function panel     Status: Abnormal   Collection Time: 01/29/17  3:27 AM  Result Value Ref Range   Total  Protein 5.1 (L) 6.5 - 8.1 g/dL   Albumin 2.2 (L) 3.5 - 5.0 g/dL   AST 48 (H) 15 - 41 U/L   ALT 47 14 - 54 U/L   Alkaline Phosphatase 188 (H) 38 - 126 U/L   Total Bilirubin 2.4 (H) 0.3 - 1.2 mg/dL   Bilirubin, Direct 1.1 (H) 0.1 - 0.5 mg/dL   Indirect Bilirubin 1.3 (H) 0.3 - 0.9 mg/dL  CBC     Status: Abnormal   Collection Time: 01/29/17  3:27 AM  Result Value Ref Range   WBC 4.7 4.0 - 10.5 K/uL   RBC 2.95 (L) 3.87 - 5.11 MIL/uL   Hemoglobin 9.4 (L) 12.0 - 15.0 g/dL   HCT 26.7 (L) 36.0 - 46.0 %   MCV 90.5 78.0 - 100.0 fL   MCH 31.9 26.0 - 34.0 pg   MCHC 35.2 30.0 - 36.0 g/dL   RDW 15.0 11.5 - 15.5 %   Platelets 231 150 - 400 K/uL  Glucose, capillary     Status: None   Collection Time: 01/29/17  7:27 AM  Result Value Ref Range   Glucose-Capillary 91 65 - 99 mg/dL  Basic metabolic panel     Status: Abnormal   Collection Time: 01/29/17  7:48 AM  Result Value Ref Range   Sodium 118 (LL) 135 - 145 mmol/L    Comment: CRITICAL RESULT CALLED TO, READ BACK BY AND VERIFIED WITH: HOWARD,C AT 8:30AM ON 01/29/17 BY FESTERMAN,C    Potassium 3.9 3.5 - 5.1 mmol/L   Chloride 80 (L) 101 - 111 mmol/L   CO2 27 22 - 32 mmol/L   Glucose, Bld 89 65 - 99 mg/dL   BUN 17 6 - 20 mg/dL   Creatinine, Ser 1.40 (H) 0.44 - 1.00 mg/dL   Calcium 8.1 (L) 8.9 - 10.3 mg/dL   GFR calc non Af Amer 36 (L) >60 mL/min   GFR calc Af Amer 42 (L) >60 mL/min    Comment: (NOTE) The eGFR has been calculated using the CKD EPI equation. This calculation has not been validated in all clinical situations. eGFR's persistently <60 mL/min signify  possible Chronic Kidney Disease.    Anion gap 11 5 - 15  Na and K (sodium & potassium), rand urine     Status: None   Collection Time: 01/29/17  9:27 AM  Result Value Ref Range   Sodium, Ur <10 mmol/L   Potassium Urine 18 mmol/L  Osmolality, urine     Status: Abnormal   Collection Time: 01/29/17  9:27 AM  Result Value Ref Range   Osmolality, Ur 167 (L) 300 - 900 mOsm/kg     Comment: Performed at [facility], [address], [place], [place] [address]  Basic metabolic panel     Status: Abnormal   Collection Time: 01/29/17 11:22 AM  Result Value Ref Range   Sodium 117 (LL) 135 - 145 mmol/L    Comment: CRITICAL RESULT CALLED TO, READ BACK BY AND VERIFIED WITH: HOWARD,C AT 12:20PM ON 01/29/17 BY FESTERMAN,C    Potassium 4.0 3.5 - 5.1 mmol/L   Chloride 80 (L) 101 - 111 mmol/L   CO2 26 22 - 32 mmol/L   Glucose, Bld 122 (H) 65 - 99 mg/dL   BUN 17 6 - 20 mg/dL   Creatinine, Ser 7.49 (H) 0.44 - 1.00 mg/dL   Calcium 8.1 (L) 8.9 - 10.3 mg/dL   GFR calc non Af Amer 33 (L) >60 mL/min   GFR calc Af Amer 38 (L) >60 mL/min    Comment: (NOTE) The eGFR has been calculated using the CKD EPI equation. This calculation has not been validated in all clinical situations. eGFR's persistently <60 mL/min signify possible Chronic Kidney Disease.    Anion gap 11 5 - 15  Glucose, capillary     Status: Abnormal   Collection Time: 01/29/17 12:18 PM  Result Value Ref Range   Glucose-Capillary 123 (H) 65 - 99 mg/dL  Basic metabolic panel     Status: Abnormal   Collection Time: 01/29/17  3:57 PM  Result Value Ref Range   Sodium 117 (LL) 135 - 145 mmol/L    Comment: CRITICAL RESULT CALLED TO, READ BACK BY AND VERIFIED WITH: HOWARD,C AT 1640 ON 5.8.2018 BY ISLEY,B    Potassium 4.2 3.5 - 5.1 mmol/L   Chloride 81 (L) 101 - 111 mmol/L   CO2 25 22 - 32 mmol/L   Glucose, Bld 165 (H) 65 - 99 mg/dL   BUN 18 6 - 20 mg/dL   Creatinine, Ser 3.55 (H) 0.44 - 1.00 mg/dL   Calcium 8.0 (L) 8.9 - 10.3 mg/dL   GFR calc non Af Amer 31 (L) >60 mL/min   GFR calc Af Amer 36 (L) >60 mL/min    Comment: (NOTE) The eGFR has been calculated using the CKD EPI equation. This calculation has not been validated in all clinical situations. eGFR's persistently <60 mL/min signify possible Chronic Kidney Disease.    Anion gap 11 5 - 15  Glucose, capillary     Status: Abnormal   Collection Time:  01/29/17  4:01 PM  Result Value Ref Range   Glucose-Capillary 156 (H) 65 - 99 mg/dL  Glucose, capillary     Status: Abnormal   Collection Time: 01/29/17  9:24 PM  Result Value Ref Range   Glucose-Capillary 117 (H) 65 - 99 mg/dL   Comment 1 Notify RN   Basic metabolic panel     Status: Abnormal   Collection Time: 01/30/17  4:21 AM  Result Value Ref Range   Sodium 119 (LL) 135 - 145 mmol/L    Comment: CRITICAL RESULT  CALLED TO, READ BACK BY AND VERIFIED WITH: HEARN,J AT 5:35AM ON 01/30/17 BY FESTERMAN,C    Potassium 3.9 3.5 - 5.1 mmol/L   Chloride 84 (L) 101 - 111 mmol/L   CO2 26 22 - 32 mmol/L   Glucose, Bld 91 65 - 99 mg/dL   BUN 17 6 - 20 mg/dL   Creatinine, Ser 2.12 (H) 0.44 - 1.00 mg/dL   Calcium 7.7 (L) 8.9 - 10.3 mg/dL   GFR calc non Af Amer 22 (L) >60 mL/min   GFR calc Af Amer 25 (L) >60 mL/min    Comment: (NOTE) The eGFR has been calculated using the CKD EPI equation. This calculation has not been validated in all clinical situations. eGFR's persistently <60 mL/min signify possible Chronic Kidney Disease.    Anion gap 9 5 - 15  CBC with Differential/Platelet     Status: Abnormal   Collection Time: 01/30/17  4:21 AM  Result Value Ref Range   WBC 4.1 4.0 - 10.5 K/uL   RBC 2.69 (L) 3.87 - 5.11 MIL/uL   Hemoglobin 8.5 (L) 12.0 - 15.0 g/dL   HCT 24.6 (L) 36.0 - 46.0 %   MCV 91.4 78.0 - 100.0 fL   MCH 31.6 26.0 - 34.0 pg   MCHC 34.6 30.0 - 36.0 g/dL   RDW 15.4 11.5 - 15.5 %   Platelets 228 150 - 400 K/uL   Neutrophils Relative % 55 %   Lymphocytes Relative 21 %   Monocytes Relative 20 %   Eosinophils Relative 2 %   Basophils Relative 2 %   Neutro Abs 2.2 1.7 - 7.7 K/uL   Lymphs Abs 0.9 0.7 - 4.0 K/uL   Monocytes Absolute 0.8 0.1 - 1.0 K/uL   Eosinophils Absolute 0.1 0.0 - 0.7 K/uL   Basophils Absolute 0.1 0.0 - 0.1 K/uL   RBC Morphology POLYCHROMASIA PRESENT     Comment: RARE NRBCs   WBC Morphology ATYPICAL LYMPHOCYTES    Smear Review LARGE PLATELETS PRESENT      Comment: PLATELET COUNT CONFIRMED BY SMEAR SPECIMEN CHECKED FOR CLOTS   Glucose, capillary     Status: None   Collection Time: 01/30/17  7:17 AM  Result Value Ref Range   Glucose-Capillary 99 65 - 99 mg/dL    ABGS No results for input(s): PHART, PO2ART, TCO2, HCO3 in the last 72 hours.  Invalid input(s): PCO2 CULTURES Recent Results (from the past 240 hour(s))  C difficile quick scan w PCR reflex     Status: Abnormal   Collection Time: 01/28/17  4:53 PM  Result Value Ref Range Status   C Diff antigen POSITIVE (A) NEGATIVE Final   C Diff toxin NEGATIVE NEGATIVE Final   C Diff interpretation Results are indeterminate. See PCR results.  Final  Clostridium Difficile by PCR     Status: None   Collection Time: 01/28/17  4:53 PM  Result Value Ref Range Status   Toxigenic C Difficile by pcr NEGATIVE NEGATIVE Final    Comment: Patient is colonized with non toxigenic C. difficile. May not need treatment unless significant symptoms are present. Performed at [facility], [place] [address], [place], [place] [address]   MRSA PCR Screening     Status: None   Collection Time: 01/28/17 11:24 PM  Result Value Ref Range Status   MRSA by PCR NEGATIVE NEGATIVE Final    Comment:        The GeneXpert MRSA Assay (FDA approved for NASAL specimens only), is one component  of a comprehensive MRSA colonization surveillance program. It is not intended to diagnose MRSA infection nor to guide or monitor treatment for MRSA infections.    Studies/Results: Dg Chest 1 View  Result Date: 01/28/2017 CLINICAL DATA:  Acute onset of shortness of breath. Initial encounter. EXAM: CHEST 1 VIEW COMPARISON:  Chest radiograph performed 12/30/2016 FINDINGS: The lungs are well-aerated. Minimal bibasilar atelectasis is noted. There is no evidence of pleural effusion or pneumothorax. The cardiomediastinal silhouette is mildly enlarged. No acute osseous abnormalities are seen. IMPRESSION: Minimal bibasilar  atelectasis noted.  Lungs otherwise clear. Electronically Signed   By: Garald Balding M.D.   On: 01/28/2017 19:36   Ct Head Wo Contrast  Result Date: 01/28/2017 CLINICAL DATA:  Weakness.  Nausea, loose stools and diarrhea. EXAM: CT HEAD WITHOUT CONTRAST TECHNIQUE: Contiguous axial images were obtained from the base of the skull through the vertex without intravenous contrast. COMPARISON:  Head CT 12/31/2016 FINDINGS: Brain: No evidence of acute infarction, hemorrhage, hydrocephalus, extra-axial collection or mass lesion/mass effect. Mild chronic small vessel ischemia is stable from prior exam. Vascular: Atherosclerosis of skullbase vasculature without hyperdense vessel or abnormal calcification. Skull: Normal. Negative for fracture or focal lesion. Sinuses/Orbits: Paranasal sinuses and mastoid air cells are clear. The visualized orbits are unchanged with mild bilateral exophthalmos. Other: None. IMPRESSION: No acute intracranial abnormality. Electronically Signed   By: Jeb Levering M.D.   On: 01/28/2017 19:51   Ct Abdomen Pelvis W Contrast  Result Date: 01/28/2017 CLINICAL DATA:  Shortness of breath, weakness, nausea and diarrhea EXAM: CT ABDOMEN AND PELVIS WITH CONTRAST TECHNIQUE: Multidetector CT imaging of the abdomen and pelvis was performed using the standard protocol following bolus administration of intravenous contrast. CONTRAST:  30m ISOVUE-300 IOPAMIDOL (ISOVUE-300) INJECTION 61% COMPARISON:  02/02/2015 FINDINGS: Lower chest: Mild cardiomegaly. No pericardial or pleural effusion. Bibasilar atelectasis. Atherosclerosis of the descending thoracic aorta. Hepatobiliary: Mild hypoattenuation of the liver parenchyma compatible with hepatic steatosis or fatty infiltration. No focal hepatic abnormality or biliary dilatation. Patent hepatic and portal veins. Remote cholecystectomy noted. Pancreas: Unremarkable. No pancreatic ductal dilatation or surrounding inflammatory changes. Spleen: Normal in size  without focal abnormality. Adrenals/Urinary Tract: Adrenal glands are unremarkable. Kidneys are normal, without renal calculi, focal lesion, or hydronephrosis. Bladder is unremarkable. Stomach/Bowel: Negative for bowel obstruction, significant dilatation, ileus, or free air. No fluid collection or abscess. Remote appendectomy noted. Colon is collapsed. Minor scattered diverticulosis. Vascular/Lymphatic: Aortic atherosclerosis noted without aneurysm or dissection. No occlusive process. No retroperitoneal hemorrhage. No adenopathy. Reproductive: Uterus and bilateral adnexa are unremarkable. Other: No abdominal wall hernia or abnormality. No abdominopelvic ascites. Musculoskeletal: Diffuse lumbar spondylosis and facet arthropathy. No acute osseous finding or compression fracture. IMPRESSION: No acute intra-abdominal or pelvic finding by CT. Remote cholecystectomy and appendectomy. Hepatic steatosis Abdominal atherosclerosis Diverticulosis. Electronically Signed   By: MJerilynn Mages  Shick M.D.   On: 01/28/2017 19:57    Medications:  Prior to Admission:  Prescriptions Prior to Admission  Medication Sig Dispense Refill Last Dose  . acetaminophen (TYLENOL) 325 MG tablet Take 2 tablets (650 mg total) by mouth every 6 (six) hours as needed for mild pain (or Fever >/= 101).   01/28/2017 at Unknown time  . albuterol (PROVENTIL HFA;VENTOLIN HFA) 108 (90 Base) MCG/ACT inhaler Inhale 2 puffs into the lungs 4 (four) times daily.    01/28/2017 at Unknown time  . apixaban (ELIQUIS) 5 MG TABS tablet Take 1 tablet (5 mg total) by mouth 2 (two) times daily. 60 tablet 5 01/28/2017 at [address] .  calcium carbonate (TUMS - DOSED IN MG ELEMENTAL CALCIUM) 500 MG chewable tablet Chew 1 tablet by mouth 2 (two) times daily.   01/28/2017 at Unknown time  . cetirizine (ZYRTEC) 10 MG tablet Take 10 mg by mouth daily.   01/28/2017 at Unknown time  . cholecalciferol (VITAMIN D) 1000 units tablet Take 1,000 Units by mouth every morning.   01/28/2017 at Unknown  time  . cholecalciferol (VITAMIN D) 400 units TABS tablet Take 400 Units by mouth daily.   01/28/2017 at Unknown time  . colchicine 0.6 MG tablet Take 0.6 mg by mouth 2 (two) times daily.   01/28/2017 at Unknown time  . diltiazem (CARDIZEM CD) 120 MG 24 hr capsule Take 1 capsule (120 mg total) by mouth daily. 90 capsule 3 01/28/2017 at Unknown time  . fluticasone furoate-vilanterol (BREO ELLIPTA) 200-25 MCG/INH AEPB Inhale 1 puff into the lungs daily.   01/28/2017 at Unknown time  . gabapentin (NEURONTIN) 100 MG capsule Take 100 mg by mouth 2 (two) times daily.   01/28/2017 at [place]  . HYDROcodone-acetaminophen (NORCO) 7.5-325 MG tablet Take 1 tablet by mouth 3 (three) times daily as needed for moderate pain.   01/27/2017 at Unknown time  . insulin glargine (LANTUS) 100 UNIT/ML injection Inject 0.2 mLs (20 Units total) into the skin daily. 10 mL 11 01/27/2017 at 2000  . insulin lispro (HUMALOG) 100 UNIT/ML injection Inject 3-20 Units into the skin 3 (three) times daily before meals. 70-100= 0 units 101-150=3 units 151-200=4 units 201-250=8 units 251-300=12units 301-350=16units Greater than 350= 20 units   01/28/2017 at Unknown time  . levothyroxine (SYNTHROID, LEVOTHROID) 137 MCG tablet Take 137 mcg by mouth daily before breakfast.   01/28/2017 at Unknown time  . Magnesium 250 MG TABS Take 2 tablets by mouth 2 (two) times daily.    01/28/2017 at Unknown time  . metoprolol tartrate (LOPRESSOR) 25 MG tablet TAKE 1/2 TABLET BY MOUTH TWICE DAILY 90 tablet 1 01/28/2017 at 800a  . Multiple Vitamin (MULTIVITAMIN WITH MINERALS) TABS tablet Take 1 tablet by mouth daily.   01/28/2017 at Unknown time  . omeprazole (PRILOSEC) 20 MG capsule TAKE ONE CAPSULE BY MOUTH DAILY 90 capsule 3 01/28/2017 at Unknown time  . ondansetron (ZOFRAN) 4 MG tablet Take 1 tablet (4 mg total) by mouth every 6 (six) hours as needed for nausea. 20 tablet 0 01/28/2017 at Unknown time  . potassium chloride SA (K-DUR,KLOR-CON) 20 MEQ tablet Take 1 tablet (20  mEq total) by mouth daily.   01/28/2017 at Unknown time  . torsemide (DEMADEX) 20 MG tablet Take 1 tablet (20 mg total) by mouth 2 (two) times daily. (may take an extra '20mg'$  as needed)   01/28/2017 at Unknown time  . triamcinolone (NASACORT ALLERGY 24HR) 55 MCG/ACT AERO nasal inhaler Place 2 sprays into the nose daily as needed (for allergies).    unknown  . feeding supplement, GLUCERNA SHAKE, (GLUCERNA SHAKE) LIQD Take 237 mLs by mouth 3 (three) times daily between meals.  0   . gabapentin (NEURONTIN) 300 MG capsule Take 1 capsule (300 mg total) by mouth 2 (two) times daily. (Patient not taking: Reported on 01/28/2017)   Not Taking at Unknown time  . polyvinyl alcohol (LIQUIFILM TEARS) 1.4 % ophthalmic solution Place 1 drop into both eyes 4 (four) times daily as needed for dry eyes. 15 mL 0 unknown   Scheduled: . albuterol  3 mL Inhalation QID  . apixaban  5 mg Oral BID  . colchicine  0.3 mg  Oral Daily  . docusate sodium  100 mg Oral BID  . feeding supplement (GLUCERNA SHAKE)  237 mL Oral BID BM  . fluticasone furoate-vilanterol  1 puff Inhalation Daily  . gabapentin  300 mg Oral BID  . triamcinolone cream   Topical BID   And  . hydrocerin   Topical BID  . insulin aspart  0-15 Units Subcutaneous TID WC  . insulin glargine  20 Units Subcutaneous Daily  . levothyroxine  137 mcg Oral QAC breakfast  . loratadine  10 mg Oral Daily  . metoprolol tartrate  12.5 mg Oral BID  . pantoprazole  40 mg Oral Daily  . sodium chloride flush  3 mL Intravenous Q12H  . vancomycin  250 mg Oral Q6H   Continuous: . sodium chloride 100 mL/hr at 01/30/17 0636   YKD:[id number] **OR** acetaminophen, HYDROcodone-acetaminophen, ondansetron **OR** ondansetron (ZOFRAN) IV, polyvinyl alcohol  Assesment: She was admitted with hyponatremia. She appeared to have some cellulitis of her left lower extremity as well. She has been on oral vancomycin and is not tolerating it. Her sodium level is slightly better. Renal  function substantially worse today.  She is severely deconditioned and is working with physical therapy  She has diabetes with fairly good control  She has a history of gout in her uric acid was very high but she is intolerant of both allopurinol and uloric. She is on colchicine and I adjusted the dose because of her renal dysfunction  She has COPD at baseline which is doing okay now  She has paroxysmal atrial fib is in sinus rhythm now and is chronically anticoagulated. Her blood pressure has been low so I have stopped diltiazem at least temporarily. She has not had any bouts of rapid atrial fib.  She has neuropathic pain in her legs and I think that's about the same  She's having diarrhea. She's been hypotensive to some extent still hyponatremic and has worsening renal function. Her C. difficile profile was equivocal but considering her situation I'm going to go ahead and start her on treatment with vancomycin. I will discontinue oral clindamycin and I don't think she needs an antibiotic right now for the cellulitis  The cellulitis of her left lower extremity looks better and she's not tolerating the clindamycin and she may have C. difficile so the clindamycin will be discontinued at least for now Principal Problem:   Hyponatremia Active Problems:   HTN (hypertension)   COPD (chronic obstructive pulmonary disease) (HCC)   Neuropathic pain   Hypothyroidism   PAF (paroxysmal atrial fibrillation) (HCC)   Cellulitis of left lower extremity   Chronic anticoagulation   Sleep apnea   Gout attack   Hyperglycemia   Elevated LFTs   Diabetes mellitus type 2 in obese [facility])    Plan: I will request nephrology consultation because of renal dysfunction. Start vancomycin recheck liver functions tomorrow    LOS: 2 days   Harun Brumley L 01/30/2017, 7:28 AM

## 2017-01-31 ENCOUNTER — Inpatient Hospital Stay (HOSPITAL_COMMUNITY): Payer: Medicare Other

## 2017-01-31 DIAGNOSIS — E860 Dehydration: Secondary | ICD-10-CM

## 2017-01-31 DIAGNOSIS — R197 Diarrhea, unspecified: Secondary | ICD-10-CM

## 2017-01-31 LAB — BASIC METABOLIC PANEL
ANION GAP: 9 (ref 5–15)
Anion gap: 10 (ref 5–15)
BUN: 17 mg/dL (ref 6–20)
BUN: 17 mg/dL (ref 6–20)
CALCIUM: 7.5 mg/dL — AB (ref 8.9–10.3)
CALCIUM: 7.4 mg/dL — AB (ref 8.9–10.3)
CO2: 22 mmol/L (ref 22–32)
CO2: 22 mmol/L (ref 22–32)
CREATININE: 2.95 mg/dL — AB (ref 0.44–1.00)
Chloride: 88 mmol/L — ABNORMAL LOW (ref 101–111)
Chloride: 87 mmol/L — ABNORMAL LOW (ref 101–111)
Creatinine, Ser: 2.97 mg/dL — ABNORMAL HIGH (ref 0.44–1.00)
GFR calc Af Amer: 17 mL/min — ABNORMAL LOW (ref >60)
GFR calc non Af Amer: 15 mL/min — ABNORMAL LOW (ref >60)
GFR, EST AFRICAN AMERICAN: 17 mL/min — AB (ref >60)
GFR, EST NON AFRICAN AMERICAN: 15 mL/min — AB (ref >60)
GLUCOSE: 77 mg/dL (ref 65–99)
Glucose, Bld: 100 mg/dL — ABNORMAL HIGH (ref 65–99)
Potassium: 3.8 mmol/L (ref 3.5–5.1)
Potassium: 3.7 mmol/L (ref 3.5–5.1)
SODIUM: 119 mmol/L — AB (ref 135–145)
Sodium: 119 mmol/L — CL (ref 135–145)

## 2017-01-31 LAB — CBC WITH DIFFERENTIAL/PLATELET
BASOS ABS: 0.1 10*3/uL (ref 0.0–0.1)
Basophils Relative: 3 %
EOS ABS: 0.1 10*3/uL (ref 0.0–0.7)
Eosinophils Relative: 2 %
HCT: 22.4 % — ABNORMAL LOW (ref 36.0–46.0)
Hemoglobin: 7.8 g/dL — ABNORMAL LOW (ref 12.0–15.0)
Lymphocytes Relative: 20 %
Lymphs Abs: 1 10*3/uL (ref 0.7–4.0)
MCH: 31.7 pg (ref 26.0–34.0)
MCHC: 34.8 g/dL (ref 30.0–36.0)
MCV: 91.1 fL (ref 78.0–100.0)
Monocytes Absolute: 0.8 10*3/uL (ref 0.1–1.0)
Monocytes Relative: 16 %
NEUTROS ABS: 2.8 10*3/uL (ref 1.7–7.7)
NEUTROS PCT: 59 %
PLATELETS: 265 10*3/uL (ref 150–400)
RBC: 2.46 MIL/uL — ABNORMAL LOW (ref 3.87–5.11)
RDW: 15.7 % — AB (ref 11.5–15.5)
WBC: 4.8 10*3/uL (ref 4.0–10.5)

## 2017-01-31 LAB — HEPATIC FUNCTION PANEL
ALT: 46 U/L (ref 14–54)
AST: 66 U/L — ABNORMAL HIGH (ref 15–41)
Albumin: 1.9 g/dL — ABNORMAL LOW (ref 3.5–5.0)
Alkaline Phosphatase: 176 U/L — ABNORMAL HIGH (ref 38–126)
BILIRUBIN DIRECT: 0.8 mg/dL — AB (ref 0.1–0.5)
BILIRUBIN INDIRECT: 0.8 mg/dL (ref 0.3–0.9)
Total Bilirubin: 1.6 mg/dL — ABNORMAL HIGH (ref 0.3–1.2)
Total Protein: 4.5 g/dL — ABNORMAL LOW (ref 6.5–8.1)

## 2017-01-31 LAB — GLUCOSE, CAPILLARY
GLUCOSE-CAPILLARY: 82 mg/dL (ref 65–99)
GLUCOSE-CAPILLARY: 82 mg/dL (ref 65–99)
Glucose-Capillary: 101 mg/dL — ABNORMAL HIGH (ref 65–99)
Glucose-Capillary: 92 mg/dL (ref 65–99)

## 2017-01-31 LAB — OSMOLALITY, URINE: Osmolality, Ur: 224 mOsm/kg — ABNORMAL LOW (ref 300–900)

## 2017-01-31 MED ORDER — IOPAMIDOL (ISOVUE-300) INJECTION 61%
INTRAVENOUS | Status: AC
  Filled 2017-01-31: qty 75

## 2017-01-31 MED ORDER — FUROSEMIDE 10 MG/ML IJ SOLN: 40.0000 mg | Freq: Two times a day (BID) | INTRAMUSCULAR | Status: DC

## 2017-01-31 MED ORDER — FUROSEMIDE 10 MG/ML IJ SOLN
40.0000 mg | Freq: Two times a day (BID) | INTRAMUSCULAR | Status: DC
  Administered 2017-01-31 – 2017-02-01 (×4): 40 mg via INTRAVENOUS
  Filled 2017-01-31 (×4): qty 4

## 2017-01-31 NOTE — Progress Notes (Signed)
LCSW following for disposition of needs:  From SNF wanting new placement.   Insurance has called and actively reviewing case. Reports due to [facility], this authorization has been sent to medical director for review and medical director is asking for a peer to peer review. Information given and all documentation has been submitted. Insurance has not authorized any return to either facility at this time.  Awaiting review and call [facility].  All information has been explained to patient and the family who verbalize understanding.  Deretha EmoryHannah Lachlyn Vanderstelt LCSW, MSW Clinical Social Work: Optician, dispensingystem [employer] Coverage for :  [phone]

## 2017-01-31 NOTE — Progress Notes (Addendum)
Subjective: She says she feels fairly well. She is still very weak. She still has some diarrhea. She's had trouble with solid food dysphagia. Her hemoglobin level is drifting down and I think some of that is delutional. She has worsened renal function.  Objective: Vital signs in last 24 hours: Temp:  [98 F (36.7 C)-98.3 F (36.8 C)] 98.3 F (36.8 C) (05/10 0800) Pulse Rate:  [94-107] 94 (05/09 2238) Resp:  [12-23] 23 (05/09 2238) BP: (87-118)/(42-99) 112/57 (05/09 2128) SpO2:  [93 %-100 %] 100 % (05/10 0813) Weight change:  Last BM Date: 01/31/17  Intake/Output from previous day: No intake/output data recorded.  PHYSICAL EXAM General appearance: alert, cooperative, mild distress and Appears weak Resp: clear to auscultation bilaterally Cardio: regular rate and rhythm, S1, S2 normal, no murmur, click, rub or gallop GI: soft, non-tender; bowel sounds normal; no masses,  no organomegaly Extremities: extremities normal, atraumatic, no cyanosis or edema Skin warm and dry. Mucous membranes are moist  Lab Results:  Results for orders placed or performed during the hospital encounter of 01/28/17 (from the past 48 hour(s))  Na and K (sodium & potassium), rand urine     Status: None   Collection Time: 01/29/17  9:27 AM  Result Value Ref Range   Sodium, Ur <10 mmol/L   Potassium Urine 18 mmol/L  Osmolality, urine     Status: Abnormal   Collection Time: 01/29/17  9:27 AM  Result Value Ref Range   Osmolality, Ur 167 (L) 300 - 900 mOsm/kg    Comment: Performed at [facility] [address] [place], [place] [address]  Basic metabolic panel     Status: Abnormal   Collection Time: 01/29/17 11:22 AM  Result Value Ref Range   Sodium 117 (LL) 135 - 145 mmol/L    Comment: CRITICAL RESULT CALLED TO, READ BACK BY AND VERIFIED WITH: HOWARD,C AT 12:20PM ON 01/29/17 BY FESTERMAN,C    Potassium 4.0 3.5 - 5.1 mmol/L   Chloride 80 (L) 101 - 111 mmol/L   CO2 26 22 - 32 mmol/L   Glucose,  Bld 122 (H) 65 - 99 mg/dL   BUN 17 6 - 20 mg/dL   Creatinine, Ser 1.52 (H) 0.44 - 1.00 mg/dL   Calcium 8.1 (L) 8.9 - 10.3 mg/dL   GFR calc non Af Amer 33 (L) >60 mL/min   GFR calc Af Amer 38 (L) >60 mL/min    Comment: (NOTE) The eGFR has been calculated using the CKD EPI equation. This calculation has not been validated in all clinical situations. eGFR's persistently <60 mL/min signify possible Chronic Kidney Disease.    Anion gap 11 5 - 15  Glucose, capillary     Status: Abnormal   Collection Time: 01/29/17 12:18 PM  Result Value Ref Range   Glucose-Capillary 123 (H) 65 - 99 mg/dL  Gastrointestinal Panel by PCR , Stool     Status: None   Collection Time: 01/29/17  3:15 PM  Result Value Ref Range   Campylobacter species NOT DETECTED NOT DETECTED   Plesimonas shigelloides NOT DETECTED NOT DETECTED   Salmonella species NOT DETECTED NOT DETECTED   Yersinia enterocolitica NOT DETECTED NOT DETECTED   Vibrio species NOT DETECTED NOT DETECTED   Vibrio cholerae NOT DETECTED NOT DETECTED   Enteroaggregative E coli (EAEC) NOT DETECTED NOT DETECTED   Enteropathogenic E coli (EPEC) NOT DETECTED NOT DETECTED   Enterotoxigenic E coli (ETEC) NOT DETECTED NOT DETECTED   Shiga like toxin producing E coli (STEC)  NOT DETECTED NOT DETECTED   Shigella/Enteroinvasive E coli (EIEC) NOT DETECTED NOT DETECTED   Cryptosporidium NOT DETECTED NOT DETECTED   Cyclospora cayetanensis NOT DETECTED NOT DETECTED   Entamoeba histolytica NOT DETECTED NOT DETECTED   Giardia lamblia NOT DETECTED NOT DETECTED   Adenovirus F40/41 NOT DETECTED NOT DETECTED   Astrovirus NOT DETECTED NOT DETECTED   Norovirus GI/GII NOT DETECTED NOT DETECTED   Rotavirus A NOT DETECTED NOT DETECTED   Sapovirus (I, II, IV, and V) NOT DETECTED NOT DETECTED  Basic metabolic panel     Status: Abnormal   Collection Time: 01/29/17  3:57 PM  Result Value Ref Range   Sodium 117 (LL) 135 - 145 mmol/L    Comment: CRITICAL RESULT CALLED TO,  READ BACK BY AND VERIFIED WITH: HOWARD,C AT 1640 ON 5.8.2018 BY ISLEY,B    Potassium 4.2 3.5 - 5.1 mmol/L   Chloride 81 (L) 101 - 111 mmol/L   CO2 25 22 - 32 mmol/L   Glucose, Bld 165 (H) 65 - 99 mg/dL   BUN 18 6 - 20 mg/dL   Creatinine, Ser 1.60 (H) 0.44 - 1.00 mg/dL   Calcium 8.0 (L) 8.9 - 10.3 mg/dL   GFR calc non Af Amer 31 (L) >60 mL/min   GFR calc Af Amer 36 (L) >60 mL/min    Comment: (NOTE) The eGFR has been calculated using the CKD EPI equation. This calculation has not been validated in all clinical situations. eGFR's persistently <60 mL/min signify possible Chronic Kidney Disease.    Anion gap 11 5 - 15  Glucose, capillary     Status: Abnormal   Collection Time: 01/29/17  4:01 PM  Result Value Ref Range   Glucose-Capillary 156 (H) 65 - 99 mg/dL  Glucose, capillary     Status: Abnormal   Collection Time: 01/29/17  9:24 PM  Result Value Ref Range   Glucose-Capillary 117 (H) 65 - 99 mg/dL   Comment 1 Notify RN   Basic metabolic panel     Status: Abnormal   Collection Time: 01/30/17  4:21 AM  Result Value Ref Range   Sodium 119 (LL) 135 - 145 mmol/L    Comment: CRITICAL RESULT CALLED TO, READ BACK BY AND VERIFIED WITH: HEARN,J AT 5:35AM ON 01/30/17 BY FESTERMAN,C    Potassium 3.9 3.5 - 5.1 mmol/L   Chloride 84 (L) 101 - 111 mmol/L   CO2 26 22 - 32 mmol/L   Glucose, Bld 91 65 - 99 mg/dL   BUN 17 6 - 20 mg/dL   Creatinine, Ser 2.12 (H) 0.44 - 1.00 mg/dL   Calcium 7.7 (L) 8.9 - 10.3 mg/dL   GFR calc non Af Amer 22 (L) >60 mL/min   GFR calc Af Amer 25 (L) >60 mL/min    Comment: (NOTE) The eGFR has been calculated using the CKD EPI equation. This calculation has not been validated in all clinical situations. eGFR's persistently <60 mL/min signify possible Chronic Kidney Disease.    Anion gap 9 5 - 15  CBC with Differential/Platelet     Status: Abnormal   Collection Time: 01/30/17  4:21 AM  Result Value Ref Range   WBC 4.1 4.0 - 10.5 K/uL   RBC 2.69 (L) 3.87 -  5.11 MIL/uL   Hemoglobin 8.5 (L) 12.0 - 15.0 g/dL   HCT 24.6 (L) 36.0 - 46.0 %   MCV 91.4 78.0 - 100.0 fL   MCH 31.6 26.0 - 34.0 pg   MCHC 34.6 30.0 - 36.0 g/dL  RDW 15.4 11.5 - 15.5 %   Platelets 228 150 - 400 K/uL   Neutrophils Relative % 55 %   Lymphocytes Relative 21 %   Monocytes Relative 20 %   Eosinophils Relative 2 %   Basophils Relative 2 %   Neutro Abs 2.2 1.7 - 7.7 K/uL   Lymphs Abs 0.9 0.7 - 4.0 K/uL   Monocytes Absolute 0.8 0.1 - 1.0 K/uL   Eosinophils Absolute 0.1 0.0 - 0.7 K/uL   Basophils Absolute 0.1 0.0 - 0.1 K/uL   RBC Morphology POLYCHROMASIA PRESENT     Comment: RARE NRBCs   WBC Morphology ATYPICAL LYMPHOCYTES    Smear Review LARGE PLATELETS PRESENT     Comment: PLATELET COUNT CONFIRMED BY SMEAR SPECIMEN CHECKED FOR CLOTS   Glucose, capillary     Status: None   Collection Time: 01/30/17  7:17 AM  Result Value Ref Range   Glucose-Capillary 99 65 - 99 mg/dL  Glucose, capillary     Status: Abnormal   Collection Time: 01/30/17 11:03 AM  Result Value Ref Range   Glucose-Capillary 130 (H) 65 - 99 mg/dL  Creatinine, urine, random     Status: None   Collection Time: 01/30/17  3:41 PM  Result Value Ref Range   Creatinine, Urine 112.48 mg/dL  Sodium, urine, random     Status: None   Collection Time: 01/30/17  3:41 PM  Result Value Ref Range   Sodium, Ur 17 mmol/L  Protein, urine, random     Status: None   Collection Time: 01/30/17  3:41 PM  Result Value Ref Range   Total Protein, Urine 64 mg/dL    Comment: NO NORMAL RANGE ESTABLISHED FOR THIS TEST  Glucose, capillary     Status: Abnormal   Collection Time: 01/30/17  4:40 PM  Result Value Ref Range   Glucose-Capillary 100 (H) 65 - 99 mg/dL  Glucose, capillary     Status: None   Collection Time: 01/30/17  9:04 PM  Result Value Ref Range   Glucose-Capillary 90 65 - 99 mg/dL  CBC with Differential/Platelet     Status: Abnormal   Collection Time: 01/31/17  4:38 AM  Result Value Ref Range   WBC 4.8 4.0 -  10.5 K/uL   RBC 2.46 (L) 3.87 - 5.11 MIL/uL   Hemoglobin 7.8 (L) 12.0 - 15.0 g/dL   HCT 22.4 (L) 36.0 - 46.0 %   MCV 91.1 78.0 - 100.0 fL   MCH 31.7 26.0 - 34.0 pg   MCHC 34.8 30.0 - 36.0 g/dL   RDW 15.7 (H) 11.5 - 15.5 %   Platelets 265 150 - 400 K/uL   Neutrophils Relative % 59 %   Lymphocytes Relative 20 %   Monocytes Relative 16 %   Eosinophils Relative 2 %   Basophils Relative 3 %   Neutro Abs 2.8 1.7 - 7.7 K/uL   Lymphs Abs 1.0 0.7 - 4.0 K/uL   Monocytes Absolute 0.8 0.1 - 1.0 K/uL   Eosinophils Absolute 0.1 0.0 - 0.7 K/uL   Basophils Absolute 0.1 0.0 - 0.1 K/uL   RBC Morphology POLYCHROMASIA PRESENT    WBC Morphology ATYPICAL LYMPHOCYTES    Smear Review LARGE PLATELETS PRESENT   Basic metabolic panel     Status: Abnormal   Collection Time: 01/31/17  4:38 AM  Result Value Ref Range   Sodium 119 (LL) 135 - 145 mmol/L    Comment: CRITICAL RESULT CALLED TO, READ BACK BY AND VERIFIED WITH: HEARN,J AT 6:20AM ON  01/31/17 BY FESTERMAN,C    Potassium 3.8 3.5 - 5.1 mmol/L   Chloride 88 (L) 101 - 111 mmol/L   CO2 22 22 - 32 mmol/L   Glucose, Bld 77 65 - 99 mg/dL   BUN 17 6 - 20 mg/dL   Creatinine, Ser 2.97 (H) 0.44 - 1.00 mg/dL   Calcium 7.5 (L) 8.9 - 10.3 mg/dL   GFR calc non Af Amer 15 (L) >60 mL/min   GFR calc Af Amer 17 (L) >60 mL/min    Comment: (NOTE) The eGFR has been calculated using the CKD EPI equation. This calculation has not been validated in all clinical situations. eGFR's persistently <60 mL/min signify possible Chronic Kidney Disease.    Anion gap 9 5 - 15  Hepatic function panel     Status: Abnormal   Collection Time: 01/31/17  4:38 AM  Result Value Ref Range   Total Protein 4.5 (L) 6.5 - 8.1 g/dL   Albumin 1.9 (L) 3.5 - 5.0 g/dL   AST 66 (H) 15 - 41 U/L   ALT 46 14 - 54 U/L   Alkaline Phosphatase 176 (H) 38 - 126 U/L   Total Bilirubin 1.6 (H) 0.3 - 1.2 mg/dL   Bilirubin, Direct 0.8 (H) 0.1 - 0.5 mg/dL   Indirect Bilirubin 0.8 0.3 - 0.9 mg/dL   Glucose, capillary     Status: None   Collection Time: 01/31/17  7:41 AM  Result Value Ref Range   Glucose-Capillary 82 65 - 99 mg/dL    ABGS No results for input(s): PHART, PO2ART, TCO2, HCO3 in the last 72 hours.  Invalid input(s): PCO2 CULTURES Recent Results (from the past 240 hour(s))  C difficile quick scan w PCR reflex     Status: Abnormal   Collection Time: 01/28/17  4:53 PM  Result Value Ref Range Status   C Diff antigen POSITIVE (A) NEGATIVE Final   C Diff toxin NEGATIVE NEGATIVE Final   C Diff interpretation Results are indeterminate. See PCR results.  Final  Clostridium Difficile by PCR     Status: None   Collection Time: 01/28/17  4:53 PM  Result Value Ref Range Status   Toxigenic C Difficile by pcr NEGATIVE NEGATIVE Final    Comment: Patient is colonized with non toxigenic C. difficile. May not need treatment unless significant symptoms are present. Performed at [facility], [place] [address], [place], [place] [address]   MRSA PCR Screening     Status: None   Collection Time: 01/28/17 11:24 PM  Result Value Ref Range Status   MRSA by PCR NEGATIVE NEGATIVE Final    Comment:        The GeneXpert MRSA Assay (FDA approved for NASAL specimens only), is one component of a comprehensive MRSA colonization surveillance program. It is not intended to diagnose MRSA infection nor to guide or monitor treatment for MRSA infections.   Gastrointestinal Panel by PCR , Stool     Status: None   Collection Time: 01/29/17  3:15 PM  Result Value Ref Range Status   Campylobacter species NOT DETECTED NOT DETECTED Final   Plesimonas shigelloides NOT DETECTED NOT DETECTED Final   Salmonella species NOT DETECTED NOT DETECTED Final   Yersinia enterocolitica NOT DETECTED NOT DETECTED Final   Vibrio species NOT DETECTED NOT DETECTED Final   Vibrio cholerae NOT DETECTED NOT DETECTED Final   Enteroaggregative E coli (EAEC) NOT DETECTED NOT DETECTED Final   Enteropathogenic E  coli (EPEC) NOT DETECTED NOT DETECTED Final  Enterotoxigenic E coli (ETEC) NOT DETECTED NOT DETECTED Final   Shiga like toxin producing E coli (STEC) NOT DETECTED NOT DETECTED Final   Shigella/Enteroinvasive E coli (EIEC) NOT DETECTED NOT DETECTED Final   Cryptosporidium NOT DETECTED NOT DETECTED Final   Cyclospora cayetanensis NOT DETECTED NOT DETECTED Final   Entamoeba histolytica NOT DETECTED NOT DETECTED Final   Giardia lamblia NOT DETECTED NOT DETECTED Final   Adenovirus F40/41 NOT DETECTED NOT DETECTED Final   Astrovirus NOT DETECTED NOT DETECTED Final   Norovirus GI/GII NOT DETECTED NOT DETECTED Final   Rotavirus A NOT DETECTED NOT DETECTED Final   Sapovirus (I, II, IV, and V) NOT DETECTED NOT DETECTED Final   Studies/Results: No results found.  Medications:  Prior to Admission:  Prescriptions Prior to Admission  Medication Sig Dispense Refill Last Dose  . acetaminophen (TYLENOL) 325 MG tablet Take 2 tablets (650 mg total) by mouth every 6 (six) hours as needed for mild pain (or Fever >/= 101).   01/28/2017 at Unknown time  . albuterol (PROVENTIL HFA;VENTOLIN HFA) 108 (90 Base) MCG/ACT inhaler Inhale 2 puffs into the lungs 4 (four) times daily.    01/28/2017 at Unknown time  . apixaban (ELIQUIS) 5 MG TABS tablet Take 1 tablet (5 mg total) by mouth 2 (two) times daily. 60 tablet 5 01/28/2017 at [place]  . calcium carbonate (TUMS - DOSED IN MG ELEMENTAL CALCIUM) 500 MG chewable tablet Chew 1 tablet by mouth 2 (two) times daily.   01/28/2017 at Unknown time  . cetirizine (ZYRTEC) 10 MG tablet Take 10 mg by mouth daily.   01/28/2017 at Unknown time  . cholecalciferol (VITAMIN D) 1000 units tablet Take 1,000 Units by mouth every morning.   01/28/2017 at Unknown time  . cholecalciferol (VITAMIN D) 400 units TABS tablet Take 400 Units by mouth daily.   01/28/2017 at Unknown time  . colchicine 0.6 MG tablet Take 0.6 mg by mouth 2 (two) times daily.   01/28/2017 at Unknown time  . diltiazem (CARDIZEM CD)  120 MG 24 hr capsule Take 1 capsule (120 mg total) by mouth daily. 90 capsule 3 01/28/2017 at Unknown time  . fluticasone furoate-vilanterol (BREO ELLIPTA) 200-25 MCG/INH AEPB Inhale 1 puff into the lungs daily.   01/28/2017 at Unknown time  . gabapentin (NEURONTIN) 100 MG capsule Take 100 mg by mouth 2 (two) times daily.   01/28/2017 at [place]  . HYDROcodone-acetaminophen (NORCO) 7.5-325 MG tablet Take 1 tablet by mouth 3 (three) times daily as needed for moderate pain.   01/27/2017 at Unknown time  . insulin glargine (LANTUS) 100 UNIT/ML injection Inject 0.2 mLs (20 Units total) into the skin daily. 10 mL 11 01/27/2017 at 2000  . insulin lispro (HUMALOG) 100 UNIT/ML injection Inject 3-20 Units into the skin 3 (three) times daily before meals. 70-100= 0 units 101-150=3 units 151-200=4 units 201-250=8 units 251-300=12units 301-350=16units Greater than 350= 20 units   01/28/2017 at Unknown time  . levothyroxine (SYNTHROID, LEVOTHROID) 137 MCG tablet Take 137 mcg by mouth daily before breakfast.   01/28/2017 at Unknown time  . Magnesium 250 MG TABS Take 2 tablets by mouth 2 (two) times daily.    01/28/2017 at Unknown time  . metoprolol tartrate (LOPRESSOR) 25 MG tablet TAKE 1/2 TABLET BY MOUTH TWICE DAILY 90 tablet 1 01/28/2017 at 800a  . Multiple Vitamin (MULTIVITAMIN WITH MINERALS) TABS tablet Take 1 tablet by mouth daily.   01/28/2017 at Unknown time  . omeprazole (PRILOSEC) 20 MG capsule TAKE ONE CAPSULE  BY MOUTH DAILY 90 capsule 3 01/28/2017 at Unknown time  . ondansetron (ZOFRAN) 4 MG tablet Take 1 tablet (4 mg total) by mouth every 6 (six) hours as needed for nausea. 20 tablet 0 01/28/2017 at Unknown time  . potassium chloride SA (K-DUR,KLOR-CON) 20 MEQ tablet Take 1 tablet (20 mEq total) by mouth daily.   01/28/2017 at Unknown time  . torsemide (DEMADEX) 20 MG tablet Take 1 tablet (20 mg total) by mouth 2 (two) times daily. (may take an extra '20mg'$  as needed)   01/28/2017 at Unknown time  . triamcinolone (NASACORT  ALLERGY 24HR) 55 MCG/ACT AERO nasal inhaler Place 2 sprays into the nose daily as needed (for allergies).    unknown  . feeding supplement, GLUCERNA SHAKE, (GLUCERNA SHAKE) LIQD Take 237 mLs by mouth 3 (three) times daily between meals.  0   . gabapentin (NEURONTIN) 300 MG capsule Take 1 capsule (300 mg total) by mouth 2 (two) times daily. (Patient not taking: Reported on 01/28/2017)   Not Taking at Unknown time  . polyvinyl alcohol (LIQUIFILM TEARS) 1.4 % ophthalmic solution Place 1 drop into both eyes 4 (four) times daily as needed for dry eyes. 15 mL 0 unknown   Scheduled: . albuterol  3 mL Inhalation TID  . colchicine  0.3 mg Oral Daily  . docusate sodium  100 mg Oral BID  . feeding supplement (GLUCERNA SHAKE)  237 mL Oral BID BM  . fluticasone furoate-vilanterol  1 puff Inhalation Daily  . gabapentin  300 mg Oral BID  . triamcinolone cream   Topical BID   And  . hydrocerin   Topical BID  . insulin aspart  0-15 Units Subcutaneous TID WC  . insulin glargine  20 Units Subcutaneous Daily  . levothyroxine  137 mcg Oral QAC breakfast  . loratadine  10 mg Oral Daily  . metoprolol tartrate  12.5 mg Oral BID  . pantoprazole  40 mg Oral Daily  . sodium chloride flush  3 mL Intravenous Q12H  . vancomycin  250 mg Oral Q6H   Continuous: . sodium chloride 100 mL/hr at 01/31/17 0400   UVO:[id number] **OR** acetaminophen, HYDROcodone-acetaminophen, ondansetron **OR** ondansetron (ZOFRAN) IV, polyvinyl alcohol  Assesment: She was admitted with hyponatremia dehydration mild cellulitis of the left lower extremity acute on chronic renal failure and elevated liver function testing.  Her sodium level is better but still less than 120  Liver function testing is still elevated  Her kidney function is worsening and she is receiving IV fluids both for the hyponatremia and for the dehydration and acute kidney injury.  She has diabetes and she is on sliding scale.  She has COPD at baseline which is  stable.  Her hemoglobin level has gone down. She's having solid food dysphagia. She has been on Eliquis which was stopped yesterday.  She has had paroxysmal atrial fibrillation but she is in sinus rhythm now  She has had episodes of diastolic heart failure so we need to be mindful of that as we give her IV fluids  She has neuropathic pain in her feet  She has fairly markedly elevated uric acid level but intolerant of allopurinol and Uloric  She now has diffuse ,much worse edema . Will  Start lasix.  Lost IV will get picc line Principal Problem:   Hyponatremia Active Problems:   HTN (hypertension)   COPD (chronic obstructive pulmonary disease) (HCC)   Neuropathic pain   Hypothyroidism   PAF (paroxysmal atrial fibrillation) (HCC)   Cellulitis  of left lower extremity   Chronic anticoagulation   Sleep apnea   Gout attack   Hyperglycemia   Elevated LFTs   Diabetes mellitus type 2 in obese [facility])    Plan: Continue current treatments. GI consultation is underway. Nephrology consultation noted and appreciated    LOS: 3 days   Rudransh Bellanca L 01/31/2017, 8:40 AM

## 2017-01-31 NOTE — Progress Notes (Signed)
Barium swallow results reported to Dr.Hawkins, order given to continue patient as NPO at this time and ok to give PO meds to patient. SLP consult ordered as well.

## 2017-01-31 NOTE — Progress Notes (Signed)
Physical Therapy Treatment Patient Details Name: Yolanda [name] MRN: [id number] DOB: 01-30-1941 Today's Date: 01/31/2017    History of Present Illness 76 y.o. female with medical history significant of Afib on Eliquis, Stage 3 CKD, hypothyroidism, GERD, HTN, diastolic heart failure, and COPD.  She was hospitalized here from 4/8-16 for acute encephalopathy thought to be related to UTI and cellulitis of the leg; she was discharged to [facility] ([place]).  While there, "it's been a long 3 weeks".  She reports laying in one spot for days.  She was still having extreme pain that she had while here, so they were unable  to start rehab.  They gave her hydrocodone to the point that she had tremors and was out of it.  She diagnosed herself with gouty attack; sister went to see Dr. Olena Leatherwood and he prescribed a new medication.  They were 2 weeks into the rehab by the time she was better.  This week, she was able to assist herself in the shower.  She has only taken a few steps in the whole month.  This weekend, she had projectile vomiting and diarrhea.  Vision was increasingly blurred, difficulty focusing.  Xanax at home was previously prn insomnia and she rarely took it, but she has been getting it BID at rehab.  Xanax was discontinued 2 days ago.  Vision is slightly better but not good.  Ongoing diarrhea, no further vomiting.  She went to an appointment with Dr. Juanetta Gosling today and he suggested that they go back and sign her out of [facility] and return for admission to [facility].  Dx: Hyponatremia, and L LE cellulitis.     PT Comments    Pt received in bed, and was agreeable to PT tx.  Pt continues to require assistance for supine<>sit, and sit<>stand transfers.  She was able to increase gait today and ambulated 108ft x 3 trials with RW and Min/Mod A.  She is limited due to a combination of fatigue and what seems to be anxiety/fear.  She is recommended for SNF at d/c.    Follow Up Recommendations  SNF     Equipment  Recommendations  None recommended by PT    Recommendations for Other Services       Precautions / Restrictions Precautions Precautions: Fall Precaution Comments: 3 falls - at church, down the steps, and then just prior to admission - fell off a stool in the bathroom.  Restrictions Weight Bearing Restrictions: No    Mobility  Bed Mobility Overal bed mobility: Needs Assistance Bed Mobility: Supine to Sit     Supine to sit: Min assist;HOB elevated        Transfers Overall transfer level: Needs assistance Equipment used: Rolling walker (2 wheeled) Transfers: Sit to/from Stand Sit to Stand: Min assist;+2 physical assistance;+2 safety/equipment            Ambulation/Gait Ambulation/Gait assistance: Min assist;Mod assist Ambulation Distance (Feet): 5 Feet (x3) Assistive device: Rolling walker (2 wheeled) Gait Pattern/deviations: Step-to pattern;Decreased stride length;Wide base of support;Trunk flexed   Gait velocity interpretation: <1.8 ft/sec, indicative of risk for recurrent falls General Gait Details: Pt requires cues for upright posture, as well as deep breathing -she seems to become anxious when mobilizing.  Pt ambulated to the recliner, then took seated rest break, then ambulated back to the bed, and took a seated rest break, and then back to the recliner.     Stairs            Psychologist, prison and probation services  Modified Rankin (Stroke Patients Only)       Balance Overall balance assessment: History of Falls;Needs assistance Sitting-balance support: Bilateral upper extremity supported;Feet supported Sitting balance-Leahy Scale: Good     Standing balance support: Bilateral upper extremity supported Standing balance-Leahy Scale: Poor                              Cognition Arousal/Alertness: Awake/alert Behavior During Therapy: WFL for tasks assessed/performed Overall Cognitive Status: Within Functional Limits for tasks assessed                                         Exercises      General Comments        Pertinent Vitals/Pain Pain Assessment: No/denies pain    Home Living                      Prior Function            PT Goals (current goals can now be found in the care plan section) Acute Rehab PT Goals Patient Stated Goal: To get stronger PT Goal Formulation: With patient/family Time For Goal Achievement: 02/12/17 Potential to Achieve Goals: Good Progress towards PT goals: Progressing toward goals    Frequency    Min 6X/week      PT Plan Current plan remains appropriate    Co-evaluation              AM-PAC PT "6 Clicks" Daily Activity  Outcome Measure  Difficulty turning over in bed (including adjusting bedclothes, sheets and blankets)?: A Little Difficulty moving from lying on back to sitting on the side of the bed? : A Little Difficulty sitting down on and standing up from a chair with arms (e.g., wheelchair, bedside commode, etc,.)?: A Lot Help needed moving to and from a bed to chair (including a wheelchair)?: A Lot Help needed walking in hospital room?: A Lot Help needed climbing 3-5 steps with a railing? : Total 6 Click Score: 13    End of Session Equipment Utilized During Treatment: Gait belt Activity Tolerance: Patient limited by fatigue Patient left: in chair;with call bell/phone within reach Nurse Communication: Mobility status (Notified Crystal that PICC line was bleeding. ) PT Visit Diagnosis: Other abnormalities of gait and mobility (R26.89);Muscle weakness (generalized) (M62.81);History of falling (Z91.81);Difficulty in walking, not elsewhere classified (R26.2);Unsteadiness on feet (R26.81)     Time: 1130-1157 PT Time Calculation (min) (ACUTE ONLY): 27 min  Charges:  $Gait Training: 8-22 mins $Therapeutic Activity: 8-22 mins                    G Codes:       Beth Burgandy Hackworth, PT, DPT X: [phone]

## 2017-01-31 NOTE — Progress Notes (Signed)
Loss of IV access on patient this AM, attempted to gain IV access without success. Patient is very edematous and a hard stick. +2 BUE, +3 BLE. MD notified and new order for PICC line placement received. [facility] notified and order placed with Ginger who stated she would call back with ETA for placement.

## 2017-01-31 NOTE — Progress Notes (Signed)
SLP Cancellation Note  Patient Details Name: Yolanda MurrainBrenda W [name] MRN: [id number] DOB: 09/25/1940  Brief note:        Acknowledge SLP consult for swallowing. Chart reviewed. Pt had barium pill esophagram earlier this date and demonstrated silent aspiration of liquid contrast and test was terminated prematurely, however the report also states that barium tablet was presented and remained in distal esophagus near GE junction. Radiologist has already gone for they day and I am unable to ask him whether Pt aspirated after the pill was presented or before. If the Pt aspirated after the pill was presented, there is a good chance that aspiration is occurring due to stasis of pill in esophagus with inability to clear esophagus resulting in aspiration. SLP will follow up on Friday and will also follow per direction of Dr. Karilyn Cotaehman and Dr. Juanetta GoslingHawkins as well. Unsure if Pt may require EGD and then SLP could follow pending those results.  Thank you,  Yolanda MorosDabney Aveya [name], Yolanda [name] [phone]    Yolanda [name] 01/31/2017, 8:27 PM

## 2017-01-31 NOTE — Progress Notes (Signed)
[facility] ETA for PICC line placement 10:45am today.

## 2017-01-31 NOTE — Consult Note (Signed)
Reason for Consult:dysphagia Referring Physician: Ahnesty Finfrock is an 76 y.o. female. Recent resident of [facility] for Rehab. She was asked by Dr. Luan Pulling yesterday to go to the ED at her OV.    HPI: Admitted yesterday with marked hyponatremia.  Noted NA low at 119.  She tells me this morning she is having dysphagia. She has pain with swallowing. Symptoms for a couple of years. She says it is hard for pills to go down. She has no problems with solid foods.  She says she has lost a bout 30 pounds.  She has not see any blood in her stools as she knows of.   She tells me her stools are loose. Averaging 1-2 stools a day.  Eliquis on hold x 1 day.  Hx of short  segment Barrett's esophagus. Her last EGD was in 2012 and was negative for dysplasia.   Hx of Afib and maintained on Eliquis. Has had drop in hemoglobin. Admitted last month to [facility] with UTI and encephalopathy.  Discharged to [facility] for Rehab.   CBC Latest Ref Rng & Units 01/31/2017 01/30/2017 01/29/2017  WBC 4.0 - 10.5 K/uL 4.8 4.1 4.7  Hemoglobin 12.0 - 15.0 g/dL 7.8(L) 8.5(L) 9.4(L)  Hematocrit 36.0 - 46.0 % 22.4(L) 24.6(L) 26.7(L)  Platelets 150 - 400 K/uL 265 228 231     Past Medical History:  Diagnosis Date  . Barrett esophagus   . Bronchitis   . Cellulitis   . COPD (chronic obstructive pulmonary disease) ([facility])   . Diastolic heart failure ([place])   . Essential hypertension   . GERD (gastroesophageal reflux disease)   . Hepatomegaly    ???  . Hypothyroidism   . PAF (paroxysmal atrial fibrillation) ([facility])    Eliquis  . Stage III chronic kidney disease     Past Surgical History:  Procedure Laterality Date  . BREAST BIOPSY Right Sept, 2012  . CHOLECYSTECTOMY    . COLONOSCOPY    . UPPER GASTROINTESTINAL ENDOSCOPY      Family History  Problem Relation Age of Onset  . Dementia Mother   . Lung cancer Father        smoked  . Hypertension Sister   . Diabetes Brother   . Obesity Sister   . Hypertension  Sister   . Restless legs syndrome Sister   . Healthy Sister   . Lung cancer Maternal Uncle     Social History:  reports that she quit smoking about 17 years ago. Her smoking use included Cigarettes. She started smoking about 58 years ago. She has a 15.00 pack-year smoking history. She has never used smokeless tobacco. She reports that she does not drink alcohol or use drugs.  Allergies:  Allergies  Allergen Reactions  . Ciprofloxacin Shortness Of Breath    REACTION: sob,tachycardia  . Doxycycline Nausea Only    Also experienced diarrhea   . Fish Oil     Patient face drew to the side,Bells Palsey  . Guaifenesin Shortness Of Breath    REACTION: sob,tachycardia  . Mucinex [Guaifenesin Er] Shortness Of Breath  . Sulfamethoxazole W/Trimethoprim 800-160 [Sulfamethoxazole-Trimethoprim] Nausea Only    Also lack of appetite   . Uloric [Febuxostat] Swelling    No urination  . Adhesive [Tape] Other (See Comments)    Causes blisters on skin  . Allopurinol Other (See Comments)    Couldn't urinate  . Cefuroxime Axetil Swelling    Swelling all over body-per patient she was hospitalized as a  result of taking this medication  . Metolazone Nausea Only    Swelling   . Tramadol     insomnia    Medications: I have reviewed the patient's current medications.  Results for orders placed or performed during the hospital encounter of 01/28/17 (from the past 48 hour(s))  Na and K (sodium & potassium), rand urine     Status: None   Collection Time: 01/29/17  9:27 AM  Result Value Ref Range   Sodium, Ur <10 mmol/L   Potassium Urine 18 mmol/L  Osmolality, urine     Status: Abnormal   Collection Time: 01/29/17  9:27 AM  Result Value Ref Range   Osmolality, Ur 167 (L) 300 - 900 mOsm/kg    Comment: Performed at [facility] [address], [place], [place] [address]  Basic metabolic panel     Status: Abnormal   Collection Time: 01/29/17 11:22 AM  Result Value Ref Range   Sodium 117 (LL) 135  - 145 mmol/L    Comment: CRITICAL RESULT CALLED TO, READ BACK BY AND VERIFIED WITH: HOWARD,C AT 12:20PM ON 01/29/17 BY FESTERMAN,C    Potassium 4.0 3.5 - 5.1 mmol/L   Chloride 80 (L) 101 - 111 mmol/L   CO2 26 22 - 32 mmol/L   Glucose, Bld 122 (H) 65 - 99 mg/dL   BUN 17 6 - 20 mg/dL   Creatinine, Ser 1.52 (H) 0.44 - 1.00 mg/dL   Calcium 8.1 (L) 8.9 - 10.3 mg/dL   GFR calc non Af Amer 33 (L) >60 mL/min   GFR calc Af Amer 38 (L) >60 mL/min    Comment: (NOTE) The eGFR has been calculated using the CKD EPI equation. This calculation has not been validated in all clinical situations. eGFR's persistently <60 mL/min signify possible Chronic Kidney Disease.    Anion gap 11 5 - 15  Glucose, capillary     Status: Abnormal   Collection Time: 01/29/17 12:18 PM  Result Value Ref Range   Glucose-Capillary 123 (H) 65 - 99 mg/dL  Gastrointestinal Panel by PCR , Stool     Status: None   Collection Time: 01/29/17  3:15 PM  Result Value Ref Range   Campylobacter species NOT DETECTED NOT DETECTED   Plesimonas shigelloides NOT DETECTED NOT DETECTED   Salmonella species NOT DETECTED NOT DETECTED   Yersinia enterocolitica NOT DETECTED NOT DETECTED   Vibrio species NOT DETECTED NOT DETECTED   Vibrio cholerae NOT DETECTED NOT DETECTED   Enteroaggregative E coli (EAEC) NOT DETECTED NOT DETECTED   Enteropathogenic E coli (EPEC) NOT DETECTED NOT DETECTED   Enterotoxigenic E coli (ETEC) NOT DETECTED NOT DETECTED   Shiga like toxin producing E coli (STEC) NOT DETECTED NOT DETECTED   Shigella/Enteroinvasive E coli (EIEC) NOT DETECTED NOT DETECTED   Cryptosporidium NOT DETECTED NOT DETECTED   Cyclospora cayetanensis NOT DETECTED NOT DETECTED   Entamoeba histolytica NOT DETECTED NOT DETECTED   Giardia lamblia NOT DETECTED NOT DETECTED   Adenovirus F40/41 NOT DETECTED NOT DETECTED   Astrovirus NOT DETECTED NOT DETECTED   Norovirus GI/GII NOT DETECTED NOT DETECTED   Rotavirus A NOT DETECTED NOT DETECTED    Sapovirus (I, II, IV, and V) NOT DETECTED NOT DETECTED  Basic metabolic panel     Status: Abnormal   Collection Time: 01/29/17  3:57 PM  Result Value Ref Range   Sodium 117 (LL) 135 - 145 mmol/L    Comment: CRITICAL RESULT CALLED TO, READ BACK BY AND VERIFIED WITH: HOWARD,C  AT 1640 ON 5.8.2018 BY ISLEY,B    Potassium 4.2 3.5 - 5.1 mmol/L   Chloride 81 (L) 101 - 111 mmol/L   CO2 25 22 - 32 mmol/L   Glucose, Bld 165 (H) 65 - 99 mg/dL   BUN 18 6 - 20 mg/dL   Creatinine, Ser 9.35 (H) 0.44 - 1.00 mg/dL   Calcium 8.0 (L) 8.9 - 10.3 mg/dL   GFR calc non Af Amer 31 (L) >60 mL/min   GFR calc Af Amer 36 (L) >60 mL/min    Comment: (NOTE) The eGFR has been calculated using the CKD EPI equation. This calculation has not been validated in all clinical situations. eGFR's persistently <60 mL/min signify possible Chronic Kidney Disease.    Anion gap 11 5 - 15  Glucose, capillary     Status: Abnormal   Collection Time: 01/29/17  4:01 PM  Result Value Ref Range   Glucose-Capillary 156 (H) 65 - 99 mg/dL  Glucose, capillary     Status: Abnormal   Collection Time: 01/29/17  9:24 PM  Result Value Ref Range   Glucose-Capillary 117 (H) 65 - 99 mg/dL   Comment 1 Notify RN   Basic metabolic panel     Status: Abnormal   Collection Time: 01/30/17  4:21 AM  Result Value Ref Range   Sodium 119 (LL) 135 - 145 mmol/L    Comment: CRITICAL RESULT CALLED TO, READ BACK BY AND VERIFIED WITH: HEARN,J AT 5:35AM ON 01/30/17 BY FESTERMAN,C    Potassium 3.9 3.5 - 5.1 mmol/L   Chloride 84 (L) 101 - 111 mmol/L   CO2 26 22 - 32 mmol/L   Glucose, Bld 91 65 - 99 mg/dL   BUN 17 6 - 20 mg/dL   Creatinine, Ser 7.01 (H) 0.44 - 1.00 mg/dL   Calcium 7.7 (L) 8.9 - 10.3 mg/dL   GFR calc non Af Amer 22 (L) >60 mL/min   GFR calc Af Amer 25 (L) >60 mL/min    Comment: (NOTE) The eGFR has been calculated using the CKD EPI equation. This calculation has not been validated in all clinical situations. eGFR's persistently <60  mL/min signify possible Chronic Kidney Disease.    Anion gap 9 5 - 15  CBC with Differential/Platelet     Status: Abnormal   Collection Time: 01/30/17  4:21 AM  Result Value Ref Range   WBC 4.1 4.0 - 10.5 K/uL   RBC 2.69 (L) 3.87 - 5.11 MIL/uL   Hemoglobin 8.5 (L) 12.0 - 15.0 g/dL   HCT 77.9 (L) 39.0 - 30.0 %   MCV 91.4 78.0 - 100.0 fL   MCH 31.6 26.0 - 34.0 pg   MCHC 34.6 30.0 - 36.0 g/dL   RDW 92.3 30.0 - 76.2 %   Platelets 228 150 - 400 K/uL   Neutrophils Relative % 55 %   Lymphocytes Relative 21 %   Monocytes Relative 20 %   Eosinophils Relative 2 %   Basophils Relative 2 %   Neutro Abs 2.2 1.7 - 7.7 K/uL   Lymphs Abs 0.9 0.7 - 4.0 K/uL   Monocytes Absolute 0.8 0.1 - 1.0 K/uL   Eosinophils Absolute 0.1 0.0 - 0.7 K/uL   Basophils Absolute 0.1 0.0 - 0.1 K/uL   RBC Morphology POLYCHROMASIA PRESENT     Comment: RARE NRBCs   WBC Morphology ATYPICAL LYMPHOCYTES    Smear Review LARGE PLATELETS PRESENT     Comment: PLATELET COUNT CONFIRMED BY SMEAR SPECIMEN CHECKED FOR CLOTS   Glucose,  capillary     Status: None   Collection Time: 01/30/17  7:17 AM  Result Value Ref Range   Glucose-Capillary 99 65 - 99 mg/dL  Glucose, capillary     Status: Abnormal   Collection Time: 01/30/17 11:03 AM  Result Value Ref Range   Glucose-Capillary 130 (H) 65 - 99 mg/dL  Creatinine, urine, random     Status: None   Collection Time: 01/30/17  3:41 PM  Result Value Ref Range   Creatinine, Urine 112.48 mg/dL  Sodium, urine, random     Status: None   Collection Time: 01/30/17  3:41 PM  Result Value Ref Range   Sodium, Ur 17 mmol/L  Protein, urine, random     Status: None   Collection Time: 01/30/17  3:41 PM  Result Value Ref Range   Total Protein, Urine 64 mg/dL    Comment: NO NORMAL RANGE ESTABLISHED FOR THIS TEST  Glucose, capillary     Status: Abnormal   Collection Time: 01/30/17  4:40 PM  Result Value Ref Range   Glucose-Capillary 100 (H) 65 - 99 mg/dL  Glucose, capillary     Status:  None   Collection Time: 01/30/17  9:04 PM  Result Value Ref Range   Glucose-Capillary 90 65 - 99 mg/dL  CBC with Differential/Platelet     Status: Abnormal   Collection Time: 01/31/17  4:38 AM  Result Value Ref Range   WBC 4.8 4.0 - 10.5 K/uL   RBC 2.46 (L) 3.87 - 5.11 MIL/uL   Hemoglobin 7.8 (L) 12.0 - 15.0 g/dL   HCT 22.4 (L) 36.0 - 46.0 %   MCV 91.1 78.0 - 100.0 fL   MCH 31.7 26.0 - 34.0 pg   MCHC 34.8 30.0 - 36.0 g/dL   RDW 15.7 (H) 11.5 - 15.5 %   Platelets 265 150 - 400 K/uL   Neutrophils Relative % 59 %   Lymphocytes Relative 20 %   Monocytes Relative 16 %   Eosinophils Relative 2 %   Basophils Relative 3 %   Neutro Abs 2.8 1.7 - 7.7 K/uL   Lymphs Abs 1.0 0.7 - 4.0 K/uL   Monocytes Absolute 0.8 0.1 - 1.0 K/uL   Eosinophils Absolute 0.1 0.0 - 0.7 K/uL   Basophils Absolute 0.1 0.0 - 0.1 K/uL   RBC Morphology POLYCHROMASIA PRESENT    WBC Morphology ATYPICAL LYMPHOCYTES    Smear Review LARGE PLATELETS PRESENT   Basic metabolic panel     Status: Abnormal   Collection Time: 01/31/17  4:38 AM  Result Value Ref Range   Sodium 119 (LL) 135 - 145 mmol/L    Comment: CRITICAL RESULT CALLED TO, READ BACK BY AND VERIFIED WITH: HEARN,J AT 6:20AM ON 01/31/17 BY FESTERMAN,C    Potassium 3.8 3.5 - 5.1 mmol/L   Chloride 88 (L) 101 - 111 mmol/L   CO2 22 22 - 32 mmol/L   Glucose, Bld 77 65 - 99 mg/dL   BUN 17 6 - 20 mg/dL   Creatinine, Ser 2.97 (H) 0.44 - 1.00 mg/dL   Calcium 7.5 (L) 8.9 - 10.3 mg/dL   GFR calc non Af Amer 15 (L) >60 mL/min   GFR calc Af Amer 17 (L) >60 mL/min    Comment: (NOTE) The eGFR has been calculated using the CKD EPI equation. This calculation has not been validated in all clinical situations. eGFR's persistently <60 mL/min signify possible Chronic Kidney Disease.    Anion gap 9 5 - 15  Hepatic function panel  Status: Abnormal   Collection Time: 01/31/17  4:38 AM  Result Value Ref Range   Total Protein 4.5 (L) 6.5 - 8.1 g/dL   Albumin 1.9 (L) 3.5  - 5.0 g/dL   AST 66 (H) 15 - 41 U/L   ALT 46 14 - 54 U/L   Alkaline Phosphatase 176 (H) 38 - 126 U/L   Total Bilirubin 1.6 (H) 0.3 - 1.2 mg/dL   Bilirubin, Direct 0.8 (H) 0.1 - 0.5 mg/dL   Indirect Bilirubin 0.8 0.3 - 0.9 mg/dL    No results found.  ROS Blood pressure (!) 112/57, pulse 94, temperature 98.1 F (36.7 C), temperature source Oral, resp. rate (!) 23, height _0  (1.575 m), weight 216 lb 14.9 oz (98.4 kg), SpO2 93 %. Physical Exam Alert and oriented. Skin warm and dry. Oral mucosa is moist.   . Sclera anicteric, conjunctivae is pink. Thyroid not enlarged. No cervical lymphadenopathy. Lungs clear. Heart regular rate and rhythm.  Abdomen is soft. Bowel sounds are positive. No hepatomegaly. No abdominal masses felt. No tenderness.  Cellulitis to left lower leg.   Assessment/Plan: Pill dysphagia. Will discuss with Dr. Laural Golden. Possible esophagram.   Torryn Fiske W 01/31/2017, 7:48 AM

## 2017-01-31 NOTE — Progress Notes (Signed)
Subjective: Interval History: has no complaint of vomiting. Patient has however nausea and some watery diarrhea. Her diarrhea however has improved..  Objective: Vital signs in last 24 hours: Temp:  [98 F (36.7 C)-98.3 F (36.8 C)] 98.3 F (36.8 C) (05/10 0800) Pulse Rate:  [94-107] 94 (05/09 2238) Resp:  [12-23] 23 (05/09 2238) BP: (87-112)/(42-99) 112/57 (05/09 2128) SpO2:  [93 %-100 %] 100 % (05/10 0813) Weight change:   Intake/Output from previous day: No intake/output data recorded. Intake/Output this shift: No intake/output data recorded.  General appearance: alert, cooperative and no distress Resp: diminished breath sounds bilaterally and wheezes bilaterally Cardio: regular rate and rhythm Extremities: extremities normal, atraumatic, no cyanosis or edema  Lab Results:  Recent Labs  01/30/17 0421 01/31/17 0438  WBC 4.1 4.8  HGB 8.5* 7.8*  HCT 24.6* 22.4*  PLT 228 265   BMET:  Recent Labs  01/30/17 0421 01/31/17 0438  NA 119* 119*  K 3.9 3.8  CL 84* 88*  CO2 26 22  GLUCOSE 91 77  BUN 17 17  CREATININE 2.12* 2.97*  CALCIUM 7.7* 7.5*   No results for input(s): PTH in the last 72 hours. Iron Studies: No results for input(s): IRON, TIBC, TRANSFERRIN, FERRITIN in the last 72 hours.  Studies/Results: No results found.  I have reviewed the patient's current medications.  Assessment/Plan: Problem #1 acute kidney injury superimposed on chronic. Presently patient is nonoliguric. Etiology was thought to be secondary to prerenal syndrome versus ATN. Her creatinine is still increasing. Patient presently is asymptomatic. Problem #2 chronic renal failure: Stage III. Thought to be secondary to diabetes/hypertension/ischemic/recurrent AK I Problem #3 hyponatremia: Her urine sodium is less than 10, urine osmolality 167, serum osmolality 257. Etiology seems to be secondary to hypervolemic hyponatremia. Her sodium is low but stable. Problem #4 anemia: Her hemoglobin is  low Problem #5 history of COPD Problem #6 history of hypothyroidism Problem #7 diabetes Plan: 1] We'll increase IV fluid to 1 25 mL per hour 2] agree with Lasix 3] will restrict to no free water intake/ice 4] will check a renal panel in the morning    LOS: 3 days   Seri Kimmer S 01/31/2017,9:07 AM

## 2017-01-31 NOTE — [place] FL2 (Signed)
[place] MEDICAID FL2 LEVEL OF CARE SCREENING TOOL     IDENTIFICATION  Patient Name: SHERYLANN VANGORDEN Birthdate: 1940/09/29 Sex: female Admission Date (Current Location): 01/28/2017  [facility] and [place] Number:  [employer] and Address:  [facility],  [address], [place] [address]      Provider Number: [id number]  Attending Physician Name and Address:  Kari Baars, MD  Relative Name and Phone Number:       Current Level of Care: Hospital Recommended Level of Care: Skilled Nursing Facility Prior Approval Number:    Date Approved/Denied:   PASRR Number: [id number]  Discharge Plan: SNF    Current Diagnoses: Patient Active Problem List   Diagnosis Date Noted  . Elevated LFTs 01/28/2017  . Diabetes mellitus type 2 in obese (HCC) 01/28/2017  . Metabolic encephalopathy 01/01/2017  . Acute encephalopathy 12/30/2016  . Acute lower UTI 12/30/2016  . Pain, dental 12/30/2016  . Spasm of back muscles 12/30/2016  . Hyperglycemia 12/30/2016  . Gout attack 09/07/2016  . Chronic anticoagulation 02/02/2016  . Chronic edema 02/02/2016  . Obesity 02/02/2016  . Sleep apnea 02/02/2016  . RBBB 02/02/2016  . Acute on chronic diastolic heart failure (HCC) 01/13/2016  . Hyponatremia 01/06/2016  . AKI (acute kidney injury) (HCC) 01/06/2016  . Cellulitis of left lower extremity 12/15/2014  . PAF (paroxysmal atrial fibrillation) (HCC) 11/09/2014  . COPD (chronic obstructive pulmonary disease) (HCC) 11/06/2014  . Neuropathic pain 11/06/2014  . Hypothyroidism 11/06/2014  . Hyperkalemia 11/06/2014  . HTN (hypertension) 01/12/2014  . Upper airway cough syndrome 09/03/2013  . GERD (gastroesophageal reflux disease) 11/13/2011  . Short-segment Barrett's esophagus 11/13/2011    Orientation RESPIRATION BLADDER Height & Weight     Self, Time, Situation, Place  O2 (3L) Continent Weight: 216 lb 14.9 oz (98.4 kg) Height:  5\' 2"  (157.5 cm)  BEHAVIORAL  SYMPTOMS/MOOD NEUROLOGICAL BOWEL NUTRITION STATUS      Continent Diet (NPO at this time, please see DC summary)  AMBULATORY STATUS COMMUNICATION OF NEEDS Skin   Extensive Assist Verbally Normal                       Personal Care Assistance Level of Assistance  Bathing, Feeding, Dressing Bathing Assistance: Maximum assistance Feeding assistance: Limited assistance Dressing Assistance: Maximum assistance     Functional Limitations Info  Sight, Hearing, Speech Sight Info: Adequate Hearing Info: Adequate Speech Info: Adequate    SPECIAL CARE FACTORS FREQUENCY  PT (By licensed PT), OT (By licensed OT)     PT Frequency: 5x OT Frequency: 5x            Contractures Contractures Info: Not present    Additional Factors Info  Code Status, Allergies Code Status Info: Full Code Allergies Info: Ciprofloxacin, Doxycycline, Fish Oil, Guaifenesin, Mucinex Guaifenesin Er, Sulfamethoxazole W/trimethoprim 800-160 Sulfamethoxazole-trimethoprim, Uloric Febuxostat, Adhesive Tape, Allopurinol, Cefuroxime Axetil, Metolazone, Tramadol           Current Medications (01/31/2017):  This is the current hospital active medication list Current Facility-Administered Medications  Medication Dose Route Frequency Provider Last Rate Last Dose  . 0.9 %  sodium chloride infusion   Intravenous Continuous Salomon Mast, MD 125 mL/hr at 01/31/17 1136 1,000 mL at 01/31/17 1136  . acetaminophen (TYLENOL) tablet 650 mg  650 mg Oral Q6H PRN Jonah Blue, MD       Or  . acetaminophen (TYLENOL) suppository 650 mg  650 mg Rectal Q6H PRN Jonah Blue, MD      .  albuterol (PROVENTIL) (2.5 MG/3ML) 0.083% nebulizer solution 3 mL  3 mL Inhalation TID Kari BaarsHawkins, Edward, MD   3 mL at 01/31/17 0808  . colchicine tablet 0.3 mg  0.3 mg Oral Daily Kari BaarsHawkins, Edward, MD      . docusate sodium (COLACE) capsule 100 mg  100 mg Oral BID Jonah BlueYates, Jennifer, MD   100 mg at 01/29/17 2120  . feeding supplement (GLUCERNA  SHAKE) (GLUCERNA SHAKE) liquid 237 mL  237 mL Oral BID BM Kari BaarsHawkins, Edward, MD      . fluticasone furoate-vilanterol (BREO ELLIPTA) 200-25 MCG/INH 1 puff  1 puff Inhalation Daily Jonah BlueYates, Jennifer, MD   1 puff at 01/31/17 0813  . furosemide (LASIX) injection 40 mg  40 mg Intravenous BID Kari BaarsHawkins, Edward, MD      . gabapentin (NEURONTIN) capsule 300 mg  300 mg Oral BID Jonah BlueYates, Jennifer, MD   300 mg at 01/30/17 2128  . triamcinolone cream (KENALOG) 0.1 %   Topical BID Philip AspenHernandez Acosta, Limmie PatriciaEstela Y, MD       And  . hydrocerin (EUCERIN) cream   Topical BID Philip AspenHernandez Acosta, Limmie PatriciaEstela Y, MD      . HYDROcodone-acetaminophen [facility](NORCO) 7.5-325 MG per tablet 1 tablet  1 tablet Oral TID PRN Jonah BlueYates, Jennifer, MD   1 tablet at 01/30/17 2208  . insulin aspart (novoLOG) injection 0-15 Units  0-15 Units Subcutaneous TID WC Jonah BlueYates, Jennifer, MD   2 Units at 01/30/17 1228  . insulin glargine (LANTUS) injection 20 Units  20 Units Subcutaneous Daily Jonah BlueYates, Jennifer, MD   20 Units at 01/30/17 1037  . levothyroxine (SYNTHROID, LEVOTHROID) tablet 137 mcg  137 mcg Oral QAC breakfast Jonah BlueYates, Jennifer, MD   137 mcg at 01/29/17 0740  . loratadine (CLARITIN) tablet 10 mg  10 mg Oral Daily Jonah BlueYates, Jennifer, MD   10 mg at 01/29/17 0827  . metoprolol tartrate (LOPRESSOR) tablet 12.5 mg  12.5 mg Oral BID Jonah BlueYates, Jennifer, MD   12.5 mg at 01/30/17 2128  . ondansetron (ZOFRAN) tablet 4 mg  4 mg Oral Q6H PRN Jonah BlueYates, Jennifer, MD       Or  . ondansetron [facility](ZOFRAN) injection 4 mg  4 mg Intravenous Q6H PRN Jonah BlueYates, Jennifer, MD   4 mg at 01/29/17 [id number]  . pantoprazole (PROTONIX) EC tablet 40 mg  40 mg Oral Daily Jonah BlueYates, Jennifer, MD   40 mg at 01/29/17 [id number]  . polyvinyl alcohol (LIQUIFILM TEARS) 1.4 % ophthalmic solution 1 drop  1 drop Both Eyes QID PRN Jonah BlueYates, Jennifer, MD      . sodium chloride flush (NS) 0.9 % injection 3 mL  3 mL Intravenous Q12H Jonah BlueYates, Jennifer, MD   3 mL at 01/30/17 2128  . vancomycin (VANCOCIN) 50 mg/mL oral solution 250 mg  250 mg  Oral Q6H Kari BaarsHawkins, Edward, MD   250 mg at 01/31/17 [id number]     Discharge Medications: Please see discharge summary for a list of discharge medications.  Relevant Imaging Results:  Relevant Lab Results:   Additional Information SSN [id number]    patient has a current PICC Line.  Raye Sorrowoble, Nai Dasch N, LCSW

## 2017-01-31 NOTE — Progress Notes (Signed)
12:40 PM Patient admitted from [facility]. Patient and family not happy with care and wanting new placement at [facility]. Call placed to [facility] who is aware and agreeable to accept. LCSW has submitted for insurance authorization to continue stay at SNF and change facility.  Plan:  Awaiting insurance auth and change in location. Most likely will DC to [facility] when medically stable.  Will continue to follow. SNF work up completed.  Lane Hacker, MSW Clinical Social Work: [employer] Coverage for :  [phone]   Clinical Social Work Assessment  Patient Details  Name: Yolanda [name] MRN: [id number] Date of Birth: 07/15/1941  Date of referral:  01/04/17               Reason for consult:  Facility Placement, Discharge Planning                    Permission sought to share information with:  Facility Sport and exercise psychologist, Family Supports Permission granted to share information::  Yes, Verbal Permission Granted             Name::                   Agency::                Relationship::                Contact Information:  sisters Lucius Conn [phone]  [phone], Francis Dowse [phone], Precious Haws [phone]  Housing/Transportation Living arrangements for the past 2 months:  [place] of Information:  Patient, Other (Comment Required) (sisters) Patient Interpreter Needed:  None Criminal Activity/Legal Involvement Pertinent to Current Situation/Hospitalization:  No - Comment as needed Significant Relationships:  Siblings, Adult Children, Other Family Members Lives with:  Self Do you feel safe going back to the place where you live?  Yes Need for family participation in patient care:  Yes (Comment) (sisters support pt in decision making)  Care giving concerns:  Pt from home where she lives alone, at baseline independent with ADLs and ambulation. Currently requiring assistance, PT has consulted and recommends SNF for ST rehab.  Pt  has strong family support and family involvement.    Social Worker assessment / plan:  CSW consulted due to pt needing SNF.  Met with pt at bedside. Pt's sisters also present. CSW explained role and reason for involvement. Pt expresses willingness to pursue SNF and preference for [facility].   CSW made referrals through [facility] to area SNFs. Pt will require insurance authorization.  Employment status:  Retired Nurse, adult PT Recommendations:  [place] / Referral to community resources:  [place]  Patient/Family's Response to care: patient and family engaged and appreciative of assistance  Patient/Family's Understanding of and Emotional Response to Diagnosis, Current Treatment, and Prognosis:  Pt and family display adequate understanding of pt's treatment and plan. Are positive about prognosis and hopeful that pt will improve and return home. Pt states, "I think rehab is the best option for me."   Emotional Assessment Appearance:  Appears stated age Attitude/Demeanor/Rapport:   (pleasant, engaged) Affect (typically observed):  Accepting, Adaptable, Appropriate Orientation:  Oriented to Self, Oriented to Place, Oriented to  Time, Oriented to Situation Alcohol / Substance use:  Not Applicable Psych involvement (Current and /or in the community):  No (Comment)  Discharge Needs  Concerns to be addressed:  No discharge needs identified Readmission within  the last 30 days:  No Current discharge risk:  None Barriers to Discharge:  Continued Medical Work up, [employer], LCSW 01/04/2017, 3:00 PM

## 2017-01-31 NOTE — Clinical Social Work Placement (Signed)
   CLINICAL SOCIAL WORK PLACEMENT  NOTE  Date:  01/31/2017  Patient Details  Name: Yolanda [name] MRN: [id number] Date of Birth: 1941/04/10  Clinical Social Work is seeking post-discharge placement for this patient at the Skilled  Nursing Facility level of care (*CSW will initial, date and re-position this form in  chart as items are completed):  Yes   Patient/family provided with [place] [facility] Clinical Social Work Department's list of facilities offering this level of care within the geographic area requested by the patient (or if unable, by the patient's family).  Yes   Patient/family informed of their freedom to choose among providers that offer the needed level of care, that participate in Medicare, Medicaid or managed care program needed by the patient, have an available bed and are willing to accept the patient.  Yes   Patient/family informed of [facility]'s ownership interest in [facility] and [facility], as well as of the fact that they are under no obligation to receive care at these facilities.  PASRR submitted to EDS on       PASRR number received on       Existing PASRR number confirmed on 01/31/17     FL2 transmitted to all facilities in geographic area requested by pt/family on 01/31/17     FL2 transmitted to all facilities within larger geographic area on       Patient informed that his/her managed care company has contracts with or will negotiate with certain facilities, including the following:            Patient/family informed of bed offers received.  Patient chooses bed at       Physician recommends and patient chooses bed at      Patient to be transferred to   on  .  Patient to be transferred to facility by       Patient family notified on   of transfer.  Name of family member notified:        PHYSICIAN Please sign FL2     Additional Comment:    _______________________________________________ Raye Sorrowoble, Naveya Ellerman N, LCSW 01/31/2017, 12:46  PM

## 2017-02-01 ENCOUNTER — Inpatient Hospital Stay (HOSPITAL_COMMUNITY): Payer: Medicare Other

## 2017-02-01 DIAGNOSIS — R7989 Other specified abnormal findings of blood chemistry: Secondary | ICD-10-CM

## 2017-02-01 DIAGNOSIS — R197 Diarrhea, unspecified: Secondary | ICD-10-CM

## 2017-02-01 DIAGNOSIS — D649 Anemia, unspecified: Secondary | ICD-10-CM

## 2017-02-01 DIAGNOSIS — R1310 Dysphagia, unspecified: Secondary | ICD-10-CM

## 2017-02-01 LAB — IRON AND TIBC
IRON: 114 ug/dL (ref 28–170)
SATURATION RATIOS: 59 % — AB (ref 10.4–31.8)
TIBC: 195 ug/dL — AB (ref 250–450)
UIBC: 81 ug/dL

## 2017-02-01 LAB — PROTIME-INR
INR: 1.39
PROTHROMBIN TIME: 17.2 s — AB (ref 11.4–15.2)

## 2017-02-01 LAB — RENAL FUNCTION PANEL
ALBUMIN: 1.8 g/dL — AB (ref 3.5–5.0)
ANION GAP: 8 (ref 5–15)
BUN: 16 mg/dL (ref 6–20)
CHLORIDE: 93 mmol/L — AB (ref 101–111)
CO2: 23 mmol/L (ref 22–32)
Calcium: 7.1 mg/dL — ABNORMAL LOW (ref 8.9–10.3)
Creatinine, Ser: 2.49 mg/dL — ABNORMAL HIGH (ref 0.44–1.00)
GFR calc Af Amer: 21 mL/min — ABNORMAL LOW (ref >60)
GFR, EST NON AFRICAN AMERICAN: 18 mL/min — AB (ref >60)
Glucose, Bld: 73 mg/dL (ref 65–99)
PHOSPHORUS: 2.9 mg/dL (ref 2.5–4.6)
POTASSIUM: 3.6 mmol/L (ref 3.5–5.1)
Sodium: 124 mmol/L — ABNORMAL LOW (ref 135–145)

## 2017-02-01 LAB — CBC WITH DIFFERENTIAL/PLATELET
BASOS ABS: 0 10*3/uL (ref 0.0–0.1)
BLASTS: 0 %
Band Neutrophils: 1 %
Basophils Relative: 1 %
EOS ABS: 0.2 10*3/uL (ref 0.0–0.7)
Eosinophils Relative: 4 %
HEMATOCRIT: 22.1 % — AB (ref 36.0–46.0)
Hemoglobin: 7.4 g/dL — ABNORMAL LOW (ref 12.0–15.0)
Lymphocytes Relative: 21 %
Lymphs Abs: 1 10*3/uL (ref 0.7–4.0)
MCH: 31.1 pg (ref 26.0–34.0)
MCHC: 33.5 g/dL (ref 30.0–36.0)
MCV: 92.9 fL (ref 78.0–100.0)
METAMYELOCYTES PCT: 10 %
MYELOCYTES: 5 %
Monocytes Absolute: 0.4 10*3/uL (ref 0.1–1.0)
Monocytes Relative: 8 %
Neutro Abs: 3 10*3/uL (ref 1.7–7.7)
Neutrophils Relative %: 50 %
Other: 0 %
PROMYELOCYTES ABS: 0 %
Platelets: 258 10*3/uL (ref 150–400)
RBC: 2.38 MIL/uL — AB (ref 3.87–5.11)
RDW: 16 % — AB (ref 11.5–15.5)
WBC: 4.6 10*3/uL (ref 4.0–10.5)
nRBC: 0 /100 WBC

## 2017-02-01 LAB — GLUCOSE, CAPILLARY
GLUCOSE-CAPILLARY: 103 mg/dL — AB (ref 65–99)
GLUCOSE-CAPILLARY: 77 mg/dL (ref 65–99)
GLUCOSE-CAPILLARY: 77 mg/dL (ref 65–99)
Glucose-Capillary: 77 mg/dL (ref 65–99)
Glucose-Capillary: 80 mg/dL (ref 65–99)
Glucose-Capillary: 111 mg/dL — ABNORMAL HIGH (ref 65–99)

## 2017-02-01 LAB — PATHOLOGIST SMEAR REVIEW

## 2017-02-01 MED ORDER — ZOLPIDEM TARTRATE 5 MG PO TABS
5.0000 mg | ORAL_TABLET | Freq: Every evening | ORAL | Status: DC | PRN
  Administered 2017-02-01: 5 mg via ORAL
  Filled 2017-02-01: qty 1

## 2017-02-01 NOTE — Progress Notes (Signed)
PT Cancellation Note  Patient Details Name: Yolanda [name] MRN: [id number] DOB: 10/31/1940   Cancelled Treatment:    Reason Eval/Treat Not Completed: Medical issues which prohibited therapy (Most recent labs reveal Na: 124, Hb: 7.8, HCT: 22.1, outside of range required for participating in PT per [facility] policy. Will monitor remotely and resume treatment once appropriate. )  12:40 PM, 02/01/17 [name], PT, DPT Physical Therapist - [place] [phone] [facility](ASCOM)  [phone] (mobile)    Buccola,Allan C 02/01/2017, 12:40 PM

## 2017-02-01 NOTE — Progress Notes (Signed)
Subjective: She says she feels a little better. She seems to have less fluid. Events of yesterday with the swallowing study noted. I have made her nothing by mouth except for meds for the moment and speech is now involved. Her sodium level has come up.  Objective: Vital signs in last 24 hours: Temp:  [97.7 F (36.5 C)-98.7 F (37.1 C)] 97.7 F (36.5 C) (05/11 0755) Pulse Rate:  [89-105] 96 (05/11 0700) Resp:  [10-26] 11 (05/11 0700) BP: (90-120)/(44-77) 105/44 (05/11 0700) SpO2:  [92 %-100 %] 92 % (05/11 0755) Weight:  [101.8 kg (224 lb 6.9 oz)] 101.8 kg (224 lb 6.9 oz) (05/11 0500) Weight change:  Last BM Date: 01/31/17  Intake/Output from previous day: 05/10 0701 - 05/11 0700 In: 6500 [I.V.:6500] Out: 1850 [Urine:1850]  PHYSICAL EXAM General appearance: alert, cooperative, mild distress and morbidly obese Resp: clear to auscultation bilaterally Cardio: regular rate and rhythm, S1, S2 normal, no murmur, click, rub or gallop GI: soft, non-tender; bowel sounds normal; no masses,  no organomegaly Extremities: She still has edema with what looks like third spacing of fluid but it is better Skin warm and dry  Lab Results:  Results for orders placed or performed during the hospital encounter of 01/28/17 (from the past 48 hour(s))  Glucose, capillary     Status: Abnormal   Collection Time: 01/30/17 11:03 AM  Result Value Ref Range   Glucose-Capillary 130 (H) 65 - 99 mg/dL  Creatinine, urine, random     Status: None   Collection Time: 01/30/17  3:41 PM  Result Value Ref Range   Creatinine, Urine 112.48 mg/dL  Osmolality, urine     Status: Abnormal   Collection Time: 01/30/17  3:41 PM  Result Value Ref Range   Osmolality, Ur 224 (L) 300 - 900 mOsm/kg    Comment: Performed at [facility], [place] [address], [place], [place] [address]  Sodium, urine, random     Status: None   Collection Time: 01/30/17  3:41 PM  Result Value Ref Range   Sodium, Ur 17 mmol/L  Protein, urine,  random     Status: None   Collection Time: 01/30/17  3:41 PM  Result Value Ref Range   Total Protein, Urine 64 mg/dL    Comment: NO NORMAL RANGE ESTABLISHED FOR THIS TEST  Glucose, capillary     Status: Abnormal   Collection Time: 01/30/17  4:40 PM  Result Value Ref Range   Glucose-Capillary 100 (H) 65 - 99 mg/dL  Glucose, capillary     Status: None   Collection Time: 01/30/17  9:04 PM  Result Value Ref Range   Glucose-Capillary 90 65 - 99 mg/dL  CBC with Differential/Platelet     Status: Abnormal   Collection Time: 01/31/17  4:38 AM  Result Value Ref Range   WBC 4.8 4.0 - 10.5 K/uL   RBC 2.46 (L) 3.87 - 5.11 MIL/uL   Hemoglobin 7.8 (L) 12.0 - 15.0 g/dL   HCT 22.4 (L) 36.0 - 46.0 %   MCV 91.1 78.0 - 100.0 fL   MCH 31.7 26.0 - 34.0 pg   MCHC 34.8 30.0 - 36.0 g/dL   RDW 15.7 (H) 11.5 - 15.5 %   Platelets 265 150 - 400 K/uL   Neutrophils Relative % 59 %   Lymphocytes Relative 20 %   Monocytes Relative 16 %   Eosinophils Relative 2 %   Basophils Relative 3 %   Neutro Abs 2.8 1.7 - 7.7 K/uL  Lymphs Abs 1.0 0.7 - 4.0 K/uL   Monocytes Absolute 0.8 0.1 - 1.0 K/uL   Eosinophils Absolute 0.1 0.0 - 0.7 K/uL   Basophils Absolute 0.1 0.0 - 0.1 K/uL   RBC Morphology POLYCHROMASIA PRESENT    WBC Morphology ATYPICAL LYMPHOCYTES    Smear Review LARGE PLATELETS PRESENT   Basic metabolic panel     Status: Abnormal   Collection Time: 01/31/17  4:38 AM  Result Value Ref Range   Sodium 119 (LL) 135 - 145 mmol/L    Comment: CRITICAL RESULT CALLED TO, READ BACK BY AND VERIFIED WITH: HEARN,J AT 6:20AM ON 01/31/17 BY FESTERMAN,C    Potassium 3.8 3.5 - 5.1 mmol/L   Chloride 88 (L) 101 - 111 mmol/L   CO2 22 22 - 32 mmol/L   Glucose, Bld 77 65 - 99 mg/dL   BUN 17 6 - 20 mg/dL   Creatinine, Ser 2.97 (H) 0.44 - 1.00 mg/dL   Calcium 7.5 (L) 8.9 - 10.3 mg/dL   GFR calc non Af Amer 15 (L) >60 mL/min   GFR calc Af Amer 17 (L) >60 mL/min    Comment: (NOTE) The eGFR has been calculated using the  CKD EPI equation. This calculation has not been validated in all clinical situations. eGFR's persistently <60 mL/min signify possible Chronic Kidney Disease.    Anion gap 9 5 - 15  Hepatic function panel     Status: Abnormal   Collection Time: 01/31/17  4:38 AM  Result Value Ref Range   Total Protein 4.5 (L) 6.5 - 8.1 g/dL   Albumin 1.9 (L) 3.5 - 5.0 g/dL   AST 66 (H) 15 - 41 U/L   ALT 46 14 - 54 U/L   Alkaline Phosphatase 176 (H) 38 - 126 U/L   Total Bilirubin 1.6 (H) 0.3 - 1.2 mg/dL   Bilirubin, Direct 0.8 (H) 0.1 - 0.5 mg/dL   Indirect Bilirubin 0.8 0.3 - 0.9 mg/dL  Glucose, capillary     Status: None   Collection Time: 01/31/17  7:41 AM  Result Value Ref Range   Glucose-Capillary 82 65 - 99 mg/dL  Glucose, capillary     Status: None   Collection Time: 01/31/17 11:39 AM  Result Value Ref Range   Glucose-Capillary 82 65 - 99 mg/dL  Basic metabolic panel     Status: Abnormal   Collection Time: 01/31/17  4:04 PM  Result Value Ref Range   Sodium 119 (LL) 135 - 145 mmol/L    Comment: CRITICAL RESULT CALLED TO, READ BACK BY AND VERIFIED WITH: MCGIBBONY,C AT 1645 ON 5.10.2018 BY ISLEY,B    Potassium 3.7 3.5 - 5.1 mmol/L   Chloride 87 (L) 101 - 111 mmol/L   CO2 22 22 - 32 mmol/L   Glucose, Bld 100 (H) 65 - 99 mg/dL   BUN 17 6 - 20 mg/dL   Creatinine, Ser 2.95 (H) 0.44 - 1.00 mg/dL   Calcium 7.4 (L) 8.9 - 10.3 mg/dL   GFR calc non Af Amer 15 (L) >60 mL/min   GFR calc Af Amer 17 (L) >60 mL/min    Comment: (NOTE) The eGFR has been calculated using the CKD EPI equation. This calculation has not been validated in all clinical situations. eGFR's persistently <60 mL/min signify possible Chronic Kidney Disease.    Anion gap 10 5 - 15  Glucose, capillary     Status: Abnormal   Collection Time: 01/31/17  4:44 PM  Result Value Ref Range   Glucose-Capillary 101 (H)  65 - 99 mg/dL  Glucose, capillary     Status: None   Collection Time: 01/31/17  8:09 PM  Result Value Ref Range    Glucose-Capillary 92 65 - 99 mg/dL  Glucose, capillary     Status: None   Collection Time: 02/01/17 12:04 AM  Result Value Ref Range   Glucose-Capillary 77 65 - 99 mg/dL  Glucose, capillary     Status: None   Collection Time: 02/01/17  4:10 AM  Result Value Ref Range   Glucose-Capillary 77 65 - 99 mg/dL  Renal function panel     Status: Abnormal   Collection Time: 02/01/17  4:17 AM  Result Value Ref Range   Sodium 124 (L) 135 - 145 mmol/L   Potassium 3.6 3.5 - 5.1 mmol/L   Chloride 93 (L) 101 - 111 mmol/L   CO2 23 22 - 32 mmol/L   Glucose, Bld 73 65 - 99 mg/dL   BUN 16 6 - 20 mg/dL   Creatinine, Ser 2.49 (H) 0.44 - 1.00 mg/dL   Calcium 7.1 (L) 8.9 - 10.3 mg/dL   Phosphorus 2.9 2.5 - 4.6 mg/dL   Albumin 1.8 (L) 3.5 - 5.0 g/dL   GFR calc non Af Amer 18 (L) >60 mL/min   GFR calc Af Amer 21 (L) >60 mL/min    Comment: (NOTE) The eGFR has been calculated using the CKD EPI equation. This calculation has not been validated in all clinical situations. eGFR's persistently <60 mL/min signify possible Chronic Kidney Disease.    Anion gap 8 5 - 15  Protime-INR     Status: Abnormal   Collection Time: 02/01/17  4:17 AM  Result Value Ref Range   Prothrombin Time 17.2 (H) 11.4 - 15.2 seconds   INR 1.39   CBC with Differential/Platelet     Status: Abnormal   Collection Time: 02/01/17  4:17 AM  Result Value Ref Range   WBC 4.6 4.0 - 10.5 K/uL   RBC 2.38 (L) 3.87 - 5.11 MIL/uL   Hemoglobin 7.4 (L) 12.0 - 15.0 g/dL   HCT 22.1 (L) 36.0 - 46.0 %   MCV 92.9 78.0 - 100.0 fL   MCH 31.1 26.0 - 34.0 pg   MCHC 33.5 30.0 - 36.0 g/dL   RDW 16.0 (H) 11.5 - 15.5 %   Platelets 258 150 - 400 K/uL   Neutrophils Relative % 50 %   Lymphocytes Relative 21 %   Monocytes Relative 8 %   Eosinophils Relative 4 %   Basophils Relative 1 %   Band Neutrophils 1 %   Metamyelocytes Relative 10 %   Myelocytes 5 %   Promyelocytes Absolute 0 %   Blasts 0 %   nRBC 0 0 /100 WBC   Other 0 %   RBC Morphology  POLYCHROMASIA PRESENT     Comment: RARE NRBCs   WBC Morphology MILD LEFT SHIFT (1-5% METAS, OCC MYELO, OCC BANDS)     Comment: ATYPICAL LYMPHOCYTES PENDING PATHOLOGIST REVIEW    Neutro Abs 3.0 1.7 - 7.7 K/uL   Lymphs Abs 1.0 0.7 - 4.0 K/uL   Monocytes Absolute 0.4 0.1 - 1.0 K/uL   Eosinophils Absolute 0.2 0.0 - 0.7 K/uL   Basophils Absolute 0.0 0.0 - 0.1 K/uL  Glucose, capillary     Status: None   Collection Time: 02/01/17  7:29 AM  Result Value Ref Range   Glucose-Capillary 77 65 - 99 mg/dL   Comment 1 Notify RN    Comment 2 Document in  Chart     ABGS No results for input(s): PHART, PO2ART, TCO2, HCO3 in the last 72 hours.  Invalid input(s): PCO2 CULTURES Recent Results (from the past 240 hour(s))  C difficile quick scan w PCR reflex     Status: Abnormal   Collection Time: 01/28/17  4:53 PM  Result Value Ref Range Status   C Diff antigen POSITIVE (A) NEGATIVE Final   C Diff toxin NEGATIVE NEGATIVE Final   C Diff interpretation Results are indeterminate. See PCR results.  Final  Clostridium Difficile by PCR     Status: None   Collection Time: 01/28/17  4:53 PM  Result Value Ref Range Status   Toxigenic C Difficile by pcr NEGATIVE NEGATIVE Final    Comment: Patient is colonized with non toxigenic C. difficile. May not need treatment unless significant symptoms are present. Performed at [facility], [place] [address], [place], [place] [address]   MRSA PCR Screening     Status: None   Collection Time: 01/28/17 11:24 PM  Result Value Ref Range Status   MRSA by PCR NEGATIVE NEGATIVE Final    Comment:        The GeneXpert MRSA Assay (FDA approved for NASAL specimens only), is one component of a comprehensive MRSA colonization surveillance program. It is not intended to diagnose MRSA infection nor to guide or monitor treatment for MRSA infections.   Gastrointestinal Panel by PCR , Stool     Status: None   Collection Time: 01/29/17  3:15 PM  Result Value Ref Range  Status   Campylobacter species NOT DETECTED NOT DETECTED Final   Plesimonas shigelloides NOT DETECTED NOT DETECTED Final   Salmonella species NOT DETECTED NOT DETECTED Final   Yersinia enterocolitica NOT DETECTED NOT DETECTED Final   Vibrio species NOT DETECTED NOT DETECTED Final   Vibrio cholerae NOT DETECTED NOT DETECTED Final   Enteroaggregative E coli (EAEC) NOT DETECTED NOT DETECTED Final   Enteropathogenic E coli (EPEC) NOT DETECTED NOT DETECTED Final   Enterotoxigenic E coli (ETEC) NOT DETECTED NOT DETECTED Final   Shiga like toxin producing E coli (STEC) NOT DETECTED NOT DETECTED Final   Shigella/Enteroinvasive E coli (EIEC) NOT DETECTED NOT DETECTED Final   Cryptosporidium NOT DETECTED NOT DETECTED Final   Cyclospora cayetanensis NOT DETECTED NOT DETECTED Final   Entamoeba histolytica NOT DETECTED NOT DETECTED Final   Giardia lamblia NOT DETECTED NOT DETECTED Final   Adenovirus F40/41 NOT DETECTED NOT DETECTED Final   Astrovirus NOT DETECTED NOT DETECTED Final   Norovirus GI/GII NOT DETECTED NOT DETECTED Final   Rotavirus A NOT DETECTED NOT DETECTED Final   Sapovirus (I, II, IV, and V) NOT DETECTED NOT DETECTED Final   Studies/Results: Dg Esophagus  Result Date: 01/31/2017 CLINICAL DATA:  Dysphagia EXAM: ESOPHOGRAM / BARIUM SWALLOW / BARIUM TABLET STUDY TECHNIQUE: Combined double contrast and single contrast examination performed using effervescent crystals, thick barium liquid, and thin barium liquid. The patient was observed with fluoroscopy swallowing a 13 mm barium sulphate tablet. Study performed with head of bed elevated 30-45 degrees during the exam. Due to aspiration, procedure was terminated prematurely. FLUOROSCOPY TIME:  Fluoroscopy Time:  2 minutes 42 seconds Radiation Exposure Index (if provided by the fluoroscopic device): 38.2 mGy Number of Acquired Spot Images: multiple fluoroscopic screen captures COMPARISON:  None FINDINGS: Esophageal distention: Question mall  narrowing at the gastroesophageal junction. Remainder of esophagus appears to distend normally. Filling defects:  None 12.5 mm barium tablet: Did not pass beyond the distal  esophagus and the stomach, though uncertain if this is due to obstruction or severe dysmotility. Multiple swallows of water or unable to pass the pill into the stomach. Motility:  Diffusely impaired Mucosa: On limited assessment walls appeared smooth without irregularity or ulceration. Hypopharynx/cervical esophagus: Dedicated AP and lateral views of the hypopharynx and cervical esophagus during swallowing were not obtained due to early termination of the procedure. Gross laryngeal penetration and aspiration of contrast were identified during the initial swallows of thick barium. Contrast was seen traveling down the RIGHT lateral wall of the trachea into the RIGHT mainstem bronchus with the patient elevated to 45 degrees. No spontaneous cough. Hiatal hernia:  Not identified GE reflux:  Not identified during the period of imaging. Other:  N/A IMPRESSION: Gross laryngeal penetration and aspiration of contrast with barium extending down the RIGHT lateral wall of the trachea into the RIGHT mainstem bronchus without identification of a spontaneous cough reflex. This necessitated early termination of the procedure. 12.5 mm diameter barium tablet passed to the distal esophagus but did not pass into the stomach, potentially due to narrowing of the distal esophagus at the gastroesophageal junction though cannot completely exclude a component of esophageal dysmotility. Electronically Signed   By: Lavonia Dana M.D.   On: 01/31/2017 15:38    Medications:  Prior to Admission:  Prescriptions Prior to Admission  Medication Sig Dispense Refill Last Dose  . acetaminophen (TYLENOL) 325 MG tablet Take 2 tablets (650 mg total) by mouth every 6 (six) hours as needed for mild pain (or Fever >/= 101).   01/28/2017 at Unknown time  . albuterol (PROVENTIL  HFA;VENTOLIN HFA) 108 (90 Base) MCG/ACT inhaler Inhale 2 puffs into the lungs 4 (four) times daily.    01/28/2017 at Unknown time  . apixaban (ELIQUIS) 5 MG TABS tablet Take 1 tablet (5 mg total) by mouth 2 (two) times daily. 60 tablet 5 01/28/2017 at [facility]  . calcium carbonate (TUMS - DOSED IN MG ELEMENTAL CALCIUM) 500 MG chewable tablet Chew 1 tablet by mouth 2 (two) times daily.   01/28/2017 at Unknown time  . cetirizine (ZYRTEC) 10 MG tablet Take 10 mg by mouth daily.   01/28/2017 at Unknown time  . cholecalciferol (VITAMIN D) 1000 units tablet Take 1,000 Units by mouth every morning.   01/28/2017 at Unknown time  . cholecalciferol (VITAMIN D) 400 units TABS tablet Take 400 Units by mouth daily.   01/28/2017 at Unknown time  . colchicine 0.6 MG tablet Take 0.6 mg by mouth 2 (two) times daily.   01/28/2017 at Unknown time  . diltiazem (CARDIZEM CD) 120 MG 24 hr capsule Take 1 capsule (120 mg total) by mouth daily. 90 capsule 3 01/28/2017 at Unknown time  . fluticasone furoate-vilanterol (BREO ELLIPTA) 200-25 MCG/INH AEPB Inhale 1 puff into the lungs daily.   01/28/2017 at Unknown time  . gabapentin (NEURONTIN) 100 MG capsule Take 100 mg by mouth 2 (two) times daily.   01/28/2017 at [facility]  . HYDROcodone-acetaminophen (NORCO) 7.5-325 MG tablet Take 1 tablet by mouth 3 (three) times daily as needed for moderate pain.   01/27/2017 at Unknown time  . insulin glargine (LANTUS) 100 UNIT/ML injection Inject 0.2 mLs (20 Units total) into the skin daily. 10 mL 11 01/27/2017 at 2000  . insulin lispro (HUMALOG) 100 UNIT/ML injection Inject 3-20 Units into the skin 3 (three) times daily before meals. 70-100= 0 units 101-150=3 units 151-200=4 units 201-250=8 units 251-300=12units 301-350=16units Greater than 350= 20 units   01/28/2017 at  Unknown time  . levothyroxine (SYNTHROID, LEVOTHROID) 137 MCG tablet Take 137 mcg by mouth daily before breakfast.   01/28/2017 at Unknown time  . Magnesium 250 MG TABS Take 2 tablets by mouth 2 (two)  times daily.    01/28/2017 at Unknown time  . metoprolol tartrate (LOPRESSOR) 25 MG tablet TAKE 1/2 TABLET BY MOUTH TWICE DAILY 90 tablet 1 01/28/2017 at 800a  . Multiple Vitamin (MULTIVITAMIN WITH MINERALS) TABS tablet Take 1 tablet by mouth daily.   01/28/2017 at Unknown time  . omeprazole (PRILOSEC) 20 MG capsule TAKE ONE CAPSULE BY MOUTH DAILY 90 capsule 3 01/28/2017 at Unknown time  . ondansetron (ZOFRAN) 4 MG tablet Take 1 tablet (4 mg total) by mouth every 6 (six) hours as needed for nausea. 20 tablet 0 01/28/2017 at Unknown time  . potassium chloride SA (K-DUR,KLOR-CON) 20 MEQ tablet Take 1 tablet (20 mEq total) by mouth daily.   01/28/2017 at Unknown time  . torsemide (DEMADEX) 20 MG tablet Take 1 tablet (20 mg total) by mouth 2 (two) times daily. (may take an extra '20mg'$  as needed)   01/28/2017 at Unknown time  . triamcinolone (NASACORT ALLERGY 24HR) 55 MCG/ACT AERO nasal inhaler Place 2 sprays into the nose daily as needed (for allergies).    unknown  . feeding supplement, GLUCERNA SHAKE, (GLUCERNA SHAKE) LIQD Take 237 mLs by mouth 3 (three) times daily between meals.  0   . gabapentin (NEURONTIN) 300 MG capsule Take 1 capsule (300 mg total) by mouth 2 (two) times daily. (Patient not taking: Reported on 01/28/2017)   Not Taking at Unknown time  . polyvinyl alcohol (LIQUIFILM TEARS) 1.4 % ophthalmic solution Place 1 drop into both eyes 4 (four) times daily as needed for dry eyes. 15 mL 0 unknown   Scheduled: . albuterol  3 mL Inhalation TID  . colchicine  0.3 mg Oral Daily  . docusate sodium  100 mg Oral BID  . feeding supplement (GLUCERNA SHAKE)  237 mL Oral BID BM  . fluticasone furoate-vilanterol  1 puff Inhalation Daily  . furosemide  40 mg Intravenous BID  . gabapentin  300 mg Oral BID  . triamcinolone cream   Topical BID   And  . hydrocerin   Topical BID  . insulin aspart  0-15 Units Subcutaneous TID WC  . insulin glargine  20 Units Subcutaneous Daily  . levothyroxine  137 mcg Oral QAC  breakfast  . loratadine  10 mg Oral Daily  . metoprolol tartrate  12.5 mg Oral BID  . pantoprazole  40 mg Oral Daily  . sodium chloride flush  3 mL Intravenous Q12H  . vancomycin  250 mg Oral Q6H   Continuous: . sodium chloride 125 mL/hr at 02/01/17 0600   JSE:[id number] **OR** acetaminophen, HYDROcodone-acetaminophen, ondansetron **OR** ondansetron (ZOFRAN) IV, polyvinyl alcohol  Assesment: She was admitted with hyponatremia. Her sodium was 1:15 on admission and it is now up to 125. She had been confused and not as interactive as normally and that has improved  She has hypertension which is pretty well controlled. She had been hypotensive. I stopped diltiazem. She had been on both metoprolol and diltiazem to manage hypertension and paroxysmal atrial fibrillation. She has done okay with her blood pressure off diltiazem and has not had any episodes of atrial fib.  She is chronically anticoagulated but Eliquis is being held for now because she may need EGD and potentially esophageal dilatation.  She has some cellulitis of the left lower extremity which is  better. She's off antibiotics.  She has chronic diastolic heart failure. She was being given significant amount of IV fluids in an attempt to correct her sodium and also because of her acute on chronic renal failure and I think she developed some acute on chronic diastolic heart failure plus some third spacing of fluid because of low albumin. This is better with Lasix.  She has acute on chronic renal failure and her renal function is a little bit better today.  At baseline she has COPD she is on inhalers and that is stable for now  She has neuropathy in her feet and had significant pain which is better with gabapentin.  She has gout and has a significantly elevated uric acid level but she is intolerant of both allopurinol and uloric  She has diabetes on sliding scale insulin  She complained of solid food dysphagia and had barium  swallow yesterday. This shows that a barium pill did not pass and also shows that she aspirated with no spontaneous cough after that. Speech is involved now.  She has sleep apnea but was not using her CPAP at home she is using it some here.  She has had significant diarrhea. Her C. difficile studies were inconclusive but since she had been on antibiotics I have gone ahead and treated her for C. difficile and the diarrhea is better.  Liver functions have been elevated and I think that's probably multi-factorial but will have further evaluation as her other problems improve  She has multiple severe potentially life-threatening medical problems requiring complex decision-making. Principal Problem:   Hyponatremia Active Problems:   HTN (hypertension)   COPD (chronic obstructive pulmonary disease) (HCC)   Neuropathic pain   Hypothyroidism   PAF (paroxysmal atrial fibrillation) (HCC)   Cellulitis of left lower extremity   Chronic anticoagulation   Sleep apnea   Gout attack   Hyperglycemia   Elevated LFTs   Diabetes mellitus type 2 in obese ([place])    Plan: Continue treatments. Continue with IV fluids and Lasix. Speech evaluation. I'm still giving her by mouth meds but no food    LOS: 4 days   Warrene Kapfer L 02/01/2017, 8:12 AM

## 2017-02-01 NOTE — Progress Notes (Signed)
Occupational Therapy Treatment Patient Details Name: Yolanda [name] MRN: [id number] DOB: [birth year] Today's Date: 02/01/2017    History of present illness 76 y.o. female with medical history significant of Afib on Eliquis, Stage 3 CKD, hypothyroidism, GERD, HTN, diastolic heart failure, and COPD. Dx: Hyponatremia, and L LE cellulitis.    OT comments  Patient in bed upon therapy arrival. Pt requested to not transfer to recliner during session. Session was completed while patient was seated on EOB to complete UB strengthening exercises. Patient's HR did become elevated periodically during session requiring VC to take breaks and focus on her breathing. Oxygen saturation dropped periodically during session (range: 88%-94%).           Precautions / Restrictions Precautions Precautions: Fall Precaution Comments: 3 falls - at church, down the steps, and then just prior to admission - fell off a stool in the bathroom.  Restrictions Weight Bearing Restrictions: No       Mobility Bed Mobility Overal bed mobility: Needs Assistance Bed Mobility: Supine to Sit;Sit to Supine     Supine to sit: Min assist;HOB elevated Sit to supine: Min assist   General bed mobility comments: vc's for rolling to his side and assistance to raise trunk up.                              ADL either performed or assessed with clinical judgement   ADL Overall ADL's : Needs assistance/impaired                     Lower Body Dressing: Total assistance;Bed level Lower Body Dressing Details (indicate cue type and reason): donning hospital socks.                     Vision Baseline Vision/History: Wears glasses Wears Glasses: At all times Patient Visual Report: Blurring of vision;Eye fatigue/eye pain/headache;Other (comment) (Difficulty holding left eye lid up.)            Cognition Arousal/Alertness: Awake/alert Behavior During Therapy: WFL for tasks assessed/performed Overall  Cognitive Status: Within Functional Limits for tasks assessed          Exercises General Exercises - Upper Extremity Shoulder Horizontal ABduction: AROM;Both;10 reps;Seated Shoulder Horizontal ADduction: AROM;Both;10 reps;Seated Shoulder Exercises Shoulder Flexion: AROM;Both;10 reps;Seated Other Exercises Other Exercises: Shoulder: protraction, rows, PNF, 10X, Both, seated           Pertinent Vitals/ Pain       Pain Assessment: No/denies pain      Progress Toward Goals  OT Goals(current goals can now be found in the care plan section)  Progress towards OT goals: Progressing toward goals     Plan Discharge plan remains appropriate;Frequency remains appropriate       AM-PAC PT "6 Clicks" Daily Activity     Outcome Measure   Help from another person eating meals?: None Help from another person taking care of personal grooming?: A Little Help from another person toileting, which includes using toliet, bedpan, or urinal?: Total Help from another person bathing (including washing, rinsing, drying)?: A Lot Help from another person to put on and taking off regular upper body clothing?: A Lot Help from another person to put on and taking off regular lower body clothing?: Total 6 Click Score: 13    End of Session    OT Visit Diagnosis: Muscle weakness (generalized) (M62.81)   Activity Tolerance Patient tolerated treatment well   Patient  Left in bed;with call bell/phone within reach;with family/visitor present   Nurse Communication      Functional Assessment Tool Used: AM-PAC 6 Clicks Daily Activity Functional Limitation: Self care Self Care Current Status (Z6109): At least 60 percent but less than 80 percent impaired, limited or restricted Self Care Goal Status (U0454): At least 40 percent but less than 60 percent impaired, limited or restricted   Time: 0929-1004 OT Time Calculation (min): 35 min  Charges: OT G-codes **NOT FOR INPATIENT CLASS** Functional  Assessment Tool Used: AM-PAC 6 Clicks Daily Activity Functional Limitation: Self care Self Care Current Status (U9811): At least 60 percent but less than 80 percent impaired, limited or restricted Self Care Goal Status (B1478): At least 40 percent but less than 60 percent impaired, limited or restricted OT General Charges $OT Visit: 1 Procedure OT Treatments $Therapeutic Exercise: 23-37 mins  Limmie Patricia, OTR/L,CBIS  [phone]    Essenmacher, Charisse March 02/01/2017, 10:20 AM

## 2017-02-01 NOTE — Progress Notes (Signed)
  Subjective: Patient has no complaints. She states she had difficulty with barium study yesterday. No melena or rectal bleeding reported. According to nursing staff she ate 50% for lunch.   Objective: Blood pressure (!) 101/37, pulse 97, temperature 98.1 F (36.7 C), temperature source Oral, resp. rate 13, height 5\' 2"  (1.575 m), weight 224 lb 6.9 oz (101.8 kg), SpO2 92 %. Patient is alert. She is undergoing occupational therapy.  Labs/studies Results:   Recent Labs  01/30/17 0421 01/31/17 0438 02/01/17 0417  WBC 4.1 4.8 4.6  HGB 8.5* 7.8* 7.4*  HCT 24.6* 22.4* 22.1*  PLT 228 265 258    BMET   Recent Labs  01/31/17 0438 01/31/17 1604 02/01/17 0417  NA 119* 119* 124*  K 3.8 3.7 3.6  CL 88* 87* 93*  CO2 22 22 23   GLUCOSE 77 100* 73  BUN 17 17 16   CREATININE 2.97* 2.95* 2.49*  CALCIUM 7.5* 7.4* 7.1*    LFT   Recent Labs  01/31/17 0438 02/01/17 0417  PROT 4.5*  --   ALBUMIN 1.9* 1.8*  AST 66*  --   ALT 46  --   ALKPHOS 176*  --   BILITOT 1.6*  --   BILIDIR 0.8*  --   IBILI 0.8  --     PT/INR   Recent Labs  02/01/17 0417  LABPROT 17.2*  INR 1.39    Barium pill esophagogram revealed laryngeal penetration and aspiration limiting the exam. Patient was able to swallow barium pill but it did not pass into the stomach and stated distal esophagus/GE junction.  Assessment:  #1. Dysphagia. It appears patient has both oropharyngeal and esophageal dysphagia. She is undergoing modified barium study to evaluate first and second part of that medication. She will also need EGD can be performed next week. Patient elected not to undergo this exam today. #2. Abnormal LFTs. Patient has history of fatty liver and may have underlying cirrhosis and decompensation due to acute illness. CT from this admission did not show any changes of cirrhosis.  #3. Anemia. No evidence of GI bleed. Dr. Juanetta GoslingHawkins of aware and will determine if she should be transfused. #4. Hyponatremia. Serum  sodium is coming up.  Recommendations:  Await results of modified barium study. EGD and possible ED  next week.  I will be out of town over the weekend. Dr. Darrick PennaFields or Dr. Jena Gaussourk will see patient on 02/04/2017

## 2017-02-01 NOTE — Progress Notes (Signed)
Patient did not wish CPAP will let nursing know if she is having problems on 2.5lpm /[place]

## 2017-02-01 NOTE — Progress Notes (Signed)
SPEECH PATHOLOGY  Brief note: MBSS scheduled for 2 PM today given aspiration on BPE yesterday.   Thank you,  Havery MorosDabney Porter, CCC-SLP [phone]

## 2017-02-01 NOTE — Progress Notes (Signed)
Subjective: Interval History: Patient is feeling much better. She doesn't have any nausea, vomiting or diarrhea.  Objective: Vital signs in last 24 hours: Temp:  [97.7 F (36.5 C)-98.7 F (37.1 C)] 97.7 F (36.5 C) (05/11 0755) Pulse Rate:  [89-105] 96 (05/11 0700) Resp:  [10-26] 11 (05/11 0700) BP: (90-120)/(44-77) 105/44 (05/11 0700) SpO2:  [92 %-100 %] 92 % (05/11 0755) Weight:  [101.8 kg (224 lb 6.9 oz)] 101.8 kg (224 lb 6.9 oz) (05/11 0500) Weight change:   Intake/Output from previous day: 05/10 0701 - 05/11 0700 In: 6500 [I.V.:6500] Out: 1850 [Urine:1850] Intake/Output this shift: No intake/output data recorded.  General appearance: alert, cooperative and no distress Resp: diminished breath sounds bilaterally and wheezes bilaterally Cardio: regular rate and rhythm Extremities: extremities normal, atraumatic, no cyanosis or edema  Lab Results:  Recent Labs  01/31/17 0438 02/01/17 0417  WBC 4.8 4.6  HGB 7.8* 7.4*  HCT 22.4* 22.1*  PLT 265 258   BMET:   Recent Labs  01/31/17 1604 02/01/17 0417  NA 119* 124*  K 3.7 3.6  CL 87* 93*  CO2 22 23  GLUCOSE 100* 73  BUN 17 16  CREATININE 2.95* 2.49*  CALCIUM 7.4* 7.1*   No results for input(s): PTH in the last 72 hours. Iron Studies: No results for input(s): IRON, TIBC, TRANSFERRIN, FERRITIN in the last 72 hours.  Studies/Results: Dg Esophagus  Result Date: 01/31/2017 CLINICAL DATA:  Dysphagia EXAM: ESOPHOGRAM / BARIUM SWALLOW / BARIUM TABLET STUDY TECHNIQUE: Combined double contrast and single contrast examination performed using effervescent crystals, thick barium liquid, and thin barium liquid. The patient was observed with fluoroscopy swallowing a 13 mm barium sulphate tablet. Study performed with head of bed elevated 30-45 degrees during the exam. Due to aspiration, procedure was terminated prematurely. FLUOROSCOPY TIME:  Fluoroscopy Time:  2 minutes 42 seconds Radiation Exposure Index (if provided by the  fluoroscopic device): 38.2 mGy Number of Acquired Spot Images: multiple fluoroscopic screen captures COMPARISON:  None FINDINGS: Esophageal distention: Question mall narrowing at the gastroesophageal junction. Remainder of esophagus appears to distend normally. Filling defects:  None 12.5 mm barium tablet: Did not pass beyond the distal esophagus and the stomach, though uncertain if this is due to obstruction or severe dysmotility. Multiple swallows of water or unable to pass the pill into the stomach. Motility:  Diffusely impaired Mucosa: On limited assessment walls appeared smooth without irregularity or ulceration. Hypopharynx/cervical esophagus: Dedicated AP and lateral views of the hypopharynx and cervical esophagus during swallowing were not obtained due to early termination of the procedure. Gross laryngeal penetration and aspiration of contrast were identified during the initial swallows of thick barium. Contrast was seen traveling down the RIGHT lateral wall of the trachea into the RIGHT mainstem bronchus with the patient elevated to 45 degrees. No spontaneous cough. Hiatal hernia:  Not identified GE reflux:  Not identified during the period of imaging. Other:  N/A IMPRESSION: Gross laryngeal penetration and aspiration of contrast with barium extending down the RIGHT lateral wall of the trachea into the RIGHT mainstem bronchus without identification of a spontaneous cough reflex. This necessitated early termination of the procedure. 12.5 mm diameter barium tablet passed to the distal esophagus but did not pass into the stomach, potentially due to narrowing of the distal esophagus at the gastroesophageal junction though cannot completely exclude a component of esophageal dysmotility. Electronically Signed   By: Ulyses SouthwardMark  Boles M.D.   On: 01/31/2017 15:38    I have reviewed the patient's current  medications.  Assessment/Plan: Problem #1 acute kidney injury superimposed on chronic. Presently patient is  nonoliguric. Etiology was thought to be secondary to prerenal syndrome versus ATN. Renal function started improving. Presently patient doesn't have any uremic sinus symptoms. Problem #2 chronic renal failure: Stage III. Thought to be secondary to diabetes/hypertension/ischemic/recurrent AK I Problem #3 hyponatremia: Thought to be secondary hypovolemic hyponatremia. Her sodium is 124 and improving. Problem #4 anemia: Her hemoglobin is low. Most likely iron deficiency anemia. Patient has previous history of antidiuretic infusion. Presently iron studies are pending. Problem #5 history of COPD Problem #6 history of hypothyroidism Problem #7 diabetes Plan: 1] will continue with present management 2] will check a renal panel in the morning    LOS: 4 days   Yaire Kreher S 02/01/2017,9:08 AM

## 2017-02-01 NOTE — Progress Notes (Signed)
Modified Barium Swallow Progress Note  Patient Details  Name: Yolanda [name] MRN: [id number] Date of Birth: [birth year]  Today's Date: 02/01/2017  Modified Barium Swallow completed.  Full report located under Chart Review in the Imaging Section.  Brief recommendations include the following:  Clinical Impression  Mild oral phase dysphagia characterized by delayed oral transit with puree and barium tablet and pharyngeal phase essentially WFL with trace penetration of thins noted with sequential straw sips. No aspiration noted and no significant residuals. Esophageal sweep completed several times throughout the examination did reveal barium filled esophagus suggestive of delayed emptying and dysmotility. Barium tablet remained near GE junction despite puree and liquid wash. Esophagus never completely cleared. Pt did demonstrate silent aspiration of barium contrast yesterday during BPE, however Pt was in semi-reclined position so this likely negatively impacted pharyngeal phase. Recommend GI f/u (already spoke with Dr. Karilyn Cotaehman who may dilate Pt on Monday). D3/mech soft and thin liquids with aspiration and reflux precautions. Reflux precautions reviewed extensively with Pt. No further SLP f/u indicated at this time.    Swallow Evaluation Recommendations   Recommended Consults: Consider GI evaluation;Consider esophageal assessment (consider EGD)   SLP Diet Recommendations: Dysphagia 3 (Mech soft) solids;Thin liquid   Liquid Administration via: Cup;Straw   Medication Administration: Whole meds with liquid   Supervision: Patient able to self feed   Compensations: Multiple dry swallows after each bite/sip   Postural Changes: Remain semi-upright after after feeds/meals (Comment);Seated upright at 90 degrees   Oral Care Recommendations: Oral care BID;Patient independent with oral care   Other Recommendations: Clarify dietary restrictions   Thank you,  Havery MorosDabney Porter,  CCC-SLP [phone]  PORTER,DABNEY 02/01/2017,3:09 PM

## 2017-02-02 LAB — BASIC METABOLIC PANEL
ANION GAP: 7 (ref 5–15)
BUN: 13 mg/dL (ref 6–20)
CHLORIDE: 101 mmol/L (ref 101–111)
CO2: 26 mmol/L (ref 22–32)
Calcium: 7.1 mg/dL — ABNORMAL LOW (ref 8.9–10.3)
Creatinine, Ser: 1.51 mg/dL — ABNORMAL HIGH (ref 0.44–1.00)
GFR calc Af Amer: 38 mL/min — ABNORMAL LOW (ref >60)
GFR calc non Af Amer: 33 mL/min — ABNORMAL LOW (ref >60)
GLUCOSE: 88 mg/dL (ref 65–99)
POTASSIUM: 3 mmol/L — AB (ref 3.5–5.1)
Sodium: 134 mmol/L — ABNORMAL LOW (ref 135–145)

## 2017-02-02 LAB — CBC WITH DIFFERENTIAL/PLATELET
BASOS PCT: 1 %
Basophils Absolute: 0.1 10*3/uL (ref 0.0–0.1)
EOS ABS: 0.1 10*3/uL (ref 0.0–0.7)
EOS PCT: 1 %
HEMATOCRIT: 22 % — AB (ref 36.0–46.0)
Hemoglobin: 7.5 g/dL — ABNORMAL LOW (ref 12.0–15.0)
LYMPHS PCT: 21 %
Lymphs Abs: 1 10*3/uL (ref 0.7–4.0)
MCH: 31.9 pg (ref 26.0–34.0)
MCHC: 34.1 g/dL (ref 30.0–36.0)
MCV: 93.6 fL (ref 78.0–100.0)
MONOS PCT: 16 %
Monocytes Absolute: 0.8 10*3/uL (ref 0.1–1.0)
NEUTROS ABS: 2.9 10*3/uL (ref 1.7–7.7)
NEUTROS PCT: 61 %
PLATELETS: 276 10*3/uL (ref 150–400)
RBC: 2.35 MIL/uL — ABNORMAL LOW (ref 3.87–5.11)
RDW: 16.3 % — ABNORMAL HIGH (ref 11.5–15.5)
WBC MORPHOLOGY: INCREASED
WBC: 4.9 10*3/uL (ref 4.0–10.5)

## 2017-02-02 LAB — RENAL FUNCTION PANEL
ANION GAP: 6 (ref 5–15)
Albumin: 1.9 g/dL — ABNORMAL LOW (ref 3.5–5.0)
BUN: 13 mg/dL (ref 6–20)
CHLORIDE: 101 mmol/L (ref 101–111)
CO2: 26 mmol/L (ref 22–32)
Calcium: 7.1 mg/dL — ABNORMAL LOW (ref 8.9–10.3)
Creatinine, Ser: 1.51 mg/dL — ABNORMAL HIGH (ref 0.44–1.00)
GFR, EST AFRICAN AMERICAN: 38 mL/min — AB (ref >60)
GFR, EST NON AFRICAN AMERICAN: 33 mL/min — AB (ref >60)
Glucose, Bld: 86 mg/dL (ref 65–99)
Phosphorus: 3.3 mg/dL (ref 2.5–4.6)
Potassium: 3 mmol/L — ABNORMAL LOW (ref 3.5–5.1)
Sodium: 133 mmol/L — ABNORMAL LOW (ref 135–145)

## 2017-02-02 LAB — GLUCOSE, CAPILLARY
GLUCOSE-CAPILLARY: 89 mg/dL (ref 65–99)
GLUCOSE-CAPILLARY: 112 mg/dL — AB (ref 65–99)
GLUCOSE-CAPILLARY: 135 mg/dL — AB (ref 65–99)
Glucose-Capillary: 140 mg/dL — ABNORMAL HIGH (ref 65–99)

## 2017-02-02 LAB — FERRITIN: Ferritin: 688 ng/mL — ABNORMAL HIGH (ref 11–307)

## 2017-02-02 MED ORDER — SODIUM CHLORIDE 0.9 % IV SOLN
510.0000 mg | Freq: Once | INTRAVENOUS | Status: AC
  Administered 2017-02-02: 510 mg via INTRAVENOUS
  Filled 2017-02-02: qty 17

## 2017-02-02 MED ORDER — POTASSIUM CHLORIDE IN NACL 20-0.45 MEQ/L-% IV SOLN
INTRAVENOUS | Status: DC
  Administered 2017-02-02 (×2): via INTRAVENOUS
  Filled 2017-02-02 (×5): qty 1000

## 2017-02-02 MED ORDER — POTASSIUM CHLORIDE CRYS ER 20 MEQ PO TBCR
20.0000 meq | EXTENDED_RELEASE_TABLET | Freq: Two times a day (BID) | ORAL | Status: DC
  Administered 2017-02-02 – 2017-02-03 (×4): 20 meq via ORAL
  Filled 2017-02-02 (×4): qty 1

## 2017-02-02 NOTE — Progress Notes (Signed)
Patient does not  Wear CPAP at home. Will discontinue use in room

## 2017-02-02 NOTE — Progress Notes (Signed)
Subjective: She says she feels better. No new complaints. Her breathing is doing okay. Much less somnolent. She was able to eat.  Objective: Vital signs in last 24 hours: Temp:  [97.8 F (36.6 C)-98.6 F (37 C)] 98.4 F (36.9 C) (05/12 0711) Pulse Rate:  [92-109] 109 (05/12 0800) Resp:  [10-20] 13 (05/12 0800) BP: (101-142)/(37-74) 105/56 (05/12 0800) SpO2:  [87 %-100 %] 97 % (05/12 0800) Weight:  [101.2 kg (223 lb 1.7 oz)] 101.2 kg (223 lb 1.7 oz) (05/12 0400) Weight change: -0.6 kg (-1 lb 5.2 oz) Last BM Date: 02/01/17  Intake/Output from previous day: 05/11 0701 - 05/12 0700 In: 3480 [P.O.:480; I.V.:3000] Out: 1025 [Urine:1025]  PHYSICAL EXAM General appearance: alert, cooperative, mild distress and morbidly obese Resp: clear to auscultation bilaterally Cardio: regular rate and rhythm, S1, S2 normal, no murmur, click, rub or gallop GI: soft, non-tender; bowel sounds normal; no masses,  no organomegaly Extremities: venous stasis dermatitis noted and Cellulitis is much better Skin warm and dry. Mucous membranes are moist  Lab Results:  Results for orders placed or performed during the hospital encounter of 01/28/17 (from the past 48 hour(s))  Glucose, capillary     Status: None   Collection Time: 01/31/17 11:39 AM  Result Value Ref Range   Glucose-Capillary 82 65 - 99 mg/dL  Basic metabolic panel     Status: Abnormal   Collection Time: 01/31/17  4:04 PM  Result Value Ref Range   Sodium 119 (LL) 135 - 145 mmol/[name]    Comment: CRITICAL RESULT CALLED TO, READ BACK BY AND VERIFIED WITH: MCGIBBONY,C AT 1645 ON 5.10.2018 BY ISLEY,B    Potassium 3.7 3.5 - 5.1 mmol/[name]   Chloride 87 ([name]) 101 - 111 mmol/[name]   CO2 22 22 - 32 mmol/[name]   Glucose, Bld 100 (H) 65 - 99 mg/dL   BUN 17 6 - 20 mg/dL   Creatinine, Ser 2.95 (H) 0.44 - 1.00 mg/dL   Calcium 7.4 ([name]) 8.9 - 10.3 mg/dL   GFR calc non Af Amer 15 ([name]) >60 mL/min   GFR calc Af Amer 17 ([name]) >60 mL/min    Comment: (NOTE) The eGFR has  been calculated using the CKD EPI equation. This calculation has not been validated in all clinical situations. eGFR's persistently <60 mL/min signify possible Chronic Kidney Disease.    Anion gap 10 5 - 15  Glucose, capillary     Status: Abnormal   Collection Time: 01/31/17  4:44 PM  Result Value Ref Range   Glucose-Capillary 101 (H) 65 - 99 mg/dL  Glucose, capillary     Status: None   Collection Time: 01/31/17  8:09 PM  Result Value Ref Range   Glucose-Capillary 92 65 - 99 mg/dL  Glucose, capillary     Status: None   Collection Time: 02/01/17 12:04 AM  Result Value Ref Range   Glucose-Capillary 77 65 - 99 mg/dL  Glucose, capillary     Status: None   Collection Time: 02/01/17  4:10 AM  Result Value Ref Range   Glucose-Capillary 77 65 - 99 mg/dL  Renal function panel     Status: Abnormal   Collection Time: 02/01/17  4:17 AM  Result Value Ref Range   Sodium 124 ([name]) 135 - 145 mmol/[name]   Potassium 3.6 3.5 - 5.1 mmol/[name]   Chloride 93 ([name]) 101 - 111 mmol/[name]   CO2 23 22 - 32 mmol/[name]   Glucose, Bld 73 65 - 99 mg/dL   BUN 16 6 -  20 mg/dL   Creatinine, Ser 2.49 (H) 0.44 - 1.00 mg/dL   Calcium 7.1 ([name]) 8.9 - 10.3 mg/dL   Phosphorus 2.9 2.5 - 4.6 mg/dL   Albumin 1.8 ([name]) 3.5 - 5.0 g/dL   GFR calc non Af Amer 18 ([name]) >60 mL/min   GFR calc Af Amer 21 ([name]) >60 mL/min    Comment: (NOTE) The eGFR has been calculated using the CKD EPI equation. This calculation has not been validated in all clinical situations. eGFR's persistently <60 mL/min signify possible Chronic Kidney Disease.    Anion gap 8 5 - 15  Protime-INR     Status: Abnormal   Collection Time: 02/01/17  4:17 AM  Result Value Ref Range   Prothrombin Time 17.2 (H) 11.4 - 15.2 seconds   INR 1.39   CBC with Differential/Platelet     Status: Abnormal   Collection Time: 02/01/17  4:17 AM  Result Value Ref Range   WBC 4.6 4.0 - 10.5 K/uL   RBC 2.38 ([name]) 3.87 - 5.11 MIL/uL   Hemoglobin 7.4 ([name]) 12.0 - 15.0 g/dL   HCT 22.1 ([name]) 36.0 -  46.0 %   MCV 92.9 78.0 - 100.0 fL   MCH 31.1 26.0 - 34.0 pg   MCHC 33.5 30.0 - 36.0 g/dL   RDW 16.0 (H) 11.5 - 15.5 %   Platelets 258 150 - 400 K/uL   Neutrophils Relative % 50 %   Lymphocytes Relative 21 %   Monocytes Relative 8 %   Eosinophils Relative 4 %   Basophils Relative 1 %   Band Neutrophils 1 %   Metamyelocytes Relative 10 %   Myelocytes 5 %   Promyelocytes Absolute 0 %   Blasts 0 %   nRBC 0 0 /100 WBC   Other 0 %   RBC Morphology POLYCHROMASIA PRESENT     Comment: RARE NRBCs   WBC Morphology MILD LEFT SHIFT (1-5% METAS, OCC MYELO, OCC BANDS)     Comment: ATYPICAL LYMPHOCYTES PENDING PATHOLOGIST REVIEW    Neutro Abs 3.0 1.7 - 7.7 K/uL   Lymphs Abs 1.0 0.7 - 4.0 K/uL   Monocytes Absolute 0.4 0.1 - 1.0 K/uL   Eosinophils Absolute 0.2 0.0 - 0.7 K/uL   Basophils Absolute 0.0 0.0 - 0.1 K/uL  Pathologist smear review     Status: None   Collection Time: 02/01/17  4:17 AM  Result Value Ref Range   Path Review Reviewed By Violet Baldy, M.D.     Comment: 02/01/2017 NORMOCYTIC ANEMIA AND LEUKOERYTHROBLASTIC REACTION. Performed at [facility], [place] [address] [place], [place] [address]   Glucose, capillary     Status: None   Collection Time: 02/01/17  7:29 AM  Result Value Ref Range   Glucose-Capillary 77 65 - 99 mg/dL   Comment 1 Notify RN    Comment 2 Document in Chart   Iron and TIBC     Status: Abnormal   Collection Time: 02/01/17  9:20 AM  Result Value Ref Range   Iron 114 28 - 170 ug/dL   TIBC 195 ([name]) 250 - 450 ug/dL   Saturation Ratios 59 (H) 10.4 - 31.8 %   UIBC 81 ug/dL    Comment: Performed at [facility], [place] [address] [place], [place] [address]  Glucose, capillary     Status: None   Collection Time: 02/01/17 11:58 AM  Result Value Ref Range   Glucose-Capillary 80 65 - 99 mg/dL   Comment 1 Notify  RN    Comment 2 Document in Chart   Glucose, capillary     Status: Abnormal   Collection Time: 02/01/17  4:31 PM  Result  Value Ref Range   Glucose-Capillary 103 (H) 65 - 99 mg/dL   Comment 1 Notify RN    Comment 2 Document in Chart   Glucose, capillary     Status: Abnormal   Collection Time: 02/01/17  8:09 PM  Result Value Ref Range   Glucose-Capillary 111 (H) 65 - 99 mg/dL   Comment 1 Notify RN    Comment 2 Document in Chart   CBC with Differential/Platelet     Status: Abnormal   Collection Time: 02/02/17  5:00 AM  Result Value Ref Range   WBC 4.9 4.0 - 10.5 K/uL   RBC 2.35 ([name]) 3.87 - 5.11 MIL/uL   Hemoglobin 7.5 ([name]) 12.0 - 15.0 g/dL   HCT 22.0 ([name]) 36.0 - 46.0 %   MCV 93.6 78.0 - 100.0 fL   MCH 31.9 26.0 - 34.0 pg   MCHC 34.1 30.0 - 36.0 g/dL   RDW 16.3 (H) 11.5 - 15.5 %   Platelets 276 150 - 400 K/uL   Neutrophils Relative % 61 %   Lymphocytes Relative 21 %   Monocytes Relative 16 %   Eosinophils Relative 1 %   Basophils Relative 1 %   Neutro Abs 2.9 1.7 - 7.7 K/uL   Lymphs Abs 1.0 0.7 - 4.0 K/uL   Monocytes Absolute 0.8 0.1 - 1.0 K/uL   Eosinophils Absolute 0.1 0.0 - 0.7 K/uL   Basophils Absolute 0.1 0.0 - 0.1 K/uL   RBC Morphology POLYCHROMASIA PRESENT    WBC Morphology INCREASED BANDS (>20% BANDS)   Basic metabolic panel     Status: Abnormal   Collection Time: 02/02/17  5:00 AM  Result Value Ref Range   Sodium 134 ([name]) 135 - 145 mmol/[name]    Comment: DELTA CHECK NOTED   Potassium 3.0 ([name]) 3.5 - 5.1 mmol/[name]   Chloride 101 101 - 111 mmol/[name]   CO2 26 22 - 32 mmol/[name]   Glucose, Bld 88 65 - 99 mg/dL   BUN 13 6 - 20 mg/dL   Creatinine, Ser 1.51 (H) 0.44 - 1.00 mg/dL   Calcium 7.1 ([name]) 8.9 - 10.3 mg/dL   GFR calc non Af Amer 33 ([name]) >60 mL/min   GFR calc Af Amer 38 ([name]) >60 mL/min    Comment: (NOTE) The eGFR has been calculated using the CKD EPI equation. This calculation has not been validated in all clinical situations. eGFR's persistently <60 mL/min signify possible Chronic Kidney Disease.    Anion gap 7 5 - 15  Renal function panel     Status: Abnormal   Collection Time: 02/02/17  5:00 AM   Result Value Ref Range   Sodium 133 ([name]) 135 - 145 mmol/[name]   Potassium 3.0 ([name]) 3.5 - 5.1 mmol/[name]   Chloride 101 101 - 111 mmol/[name]   CO2 26 22 - 32 mmol/[name]   Glucose, Bld 86 65 - 99 mg/dL   BUN 13 6 - 20 mg/dL   Creatinine, Ser 1.51 (H) 0.44 - 1.00 mg/dL   Calcium 7.1 ([name]) 8.9 - 10.3 mg/dL   Phosphorus 3.3 2.5 - 4.6 mg/dL   Albumin 1.9 ([name]) 3.5 - 5.0 g/dL   GFR calc non Af Amer 33 ([name]) >60 mL/min   GFR calc Af Amer 38 ([name]) >60 mL/min    Comment: (NOTE) The eGFR has been calculated  using the CKD EPI equation. This calculation has not been validated in all clinical situations. eGFR's persistently <60 mL/min signify possible Chronic Kidney Disease.    Anion gap 6 5 - 15  Glucose, capillary     Status: None   Collection Time: 02/02/17  7:10 AM  Result Value Ref Range   Glucose-Capillary 89 65 - 99 mg/dL    ABGS No results for input(s): PHART, PO2ART, TCO2, HCO3 in the last 72 hours.  Invalid input(s): PCO2 CULTURES Recent Results (from the past 240 hour(s))  C difficile quick scan w PCR reflex     Status: Abnormal   Collection Time: 01/28/17  4:53 PM  Result Value Ref Range Status   C Diff antigen POSITIVE (A) NEGATIVE Final   C Diff toxin NEGATIVE NEGATIVE Final   C Diff interpretation Results are indeterminate. See PCR results.  Final  Clostridium Difficile by PCR     Status: None   Collection Time: 01/28/17  4:53 PM  Result Value Ref Range Status   Toxigenic C Difficile by pcr NEGATIVE NEGATIVE Final    Comment: Patient is colonized with non toxigenic C. difficile. May not need treatment unless significant symptoms are present. Performed at [facility], [place] [address], [place], [place] [address]   MRSA PCR Screening     Status: None   Collection Time: 01/28/17 11:24 PM  Result Value Ref Range Status   MRSA by PCR NEGATIVE NEGATIVE Final    Comment:        The GeneXpert MRSA Assay (FDA approved for NASAL specimens only), is one component of a comprehensive MRSA  colonization surveillance program. It is not intended to diagnose MRSA infection nor to guide or monitor treatment for MRSA infections.   Gastrointestinal Panel by PCR , Stool     Status: None   Collection Time: 01/29/17  3:15 PM  Result Value Ref Range Status   Campylobacter species NOT DETECTED NOT DETECTED Final   Plesimonas shigelloides NOT DETECTED NOT DETECTED Final   Salmonella species NOT DETECTED NOT DETECTED Final   Yersinia enterocolitica NOT DETECTED NOT DETECTED Final   Vibrio species NOT DETECTED NOT DETECTED Final   Vibrio cholerae NOT DETECTED NOT DETECTED Final   Enteroaggregative E coli (EAEC) NOT DETECTED NOT DETECTED Final   Enteropathogenic E coli (EPEC) NOT DETECTED NOT DETECTED Final   Enterotoxigenic E coli (ETEC) NOT DETECTED NOT DETECTED Final   Shiga like toxin producing E coli (STEC) NOT DETECTED NOT DETECTED Final   Shigella/Enteroinvasive E coli (EIEC) NOT DETECTED NOT DETECTED Final   Cryptosporidium NOT DETECTED NOT DETECTED Final   Cyclospora cayetanensis NOT DETECTED NOT DETECTED Final   Entamoeba histolytica NOT DETECTED NOT DETECTED Final   Giardia lamblia NOT DETECTED NOT DETECTED Final   Adenovirus F40/41 NOT DETECTED NOT DETECTED Final   Astrovirus NOT DETECTED NOT DETECTED Final   Norovirus GI/GII NOT DETECTED NOT DETECTED Final   Rotavirus A NOT DETECTED NOT DETECTED Final   Sapovirus (I, II, IV, and V) NOT DETECTED NOT DETECTED Final   Studies/Results: Dg Esophagus  Result Date: 01/31/2017 CLINICAL DATA:  Dysphagia EXAM: ESOPHOGRAM / BARIUM SWALLOW / BARIUM TABLET STUDY TECHNIQUE: Combined double contrast and single contrast examination performed using effervescent crystals, thick barium liquid, and thin barium liquid. The patient was observed with fluoroscopy swallowing a 13 mm barium sulphate tablet. Study performed with head of bed elevated 30-45 degrees during the exam. Due to aspiration, procedure was terminated prematurely. FLUOROSCOPY  TIME:  Fluoroscopy Time:  2 minutes 42 seconds Radiation Exposure Index (if provided by the fluoroscopic device): 38.2 mGy Number of Acquired Spot Images: multiple fluoroscopic screen captures COMPARISON:  None FINDINGS: Esophageal distention: Question mall narrowing at the gastroesophageal junction. Remainder of esophagus appears to distend normally. Filling defects:  None 12.5 mm barium tablet: Did not pass beyond the distal esophagus and the stomach, though uncertain if this is due to obstruction or severe dysmotility. Multiple swallows of water or unable to pass the pill into the stomach. Motility:  Diffusely impaired Mucosa: On limited assessment walls appeared smooth without irregularity or ulceration. Hypopharynx/cervical esophagus: Dedicated AP and lateral views of the hypopharynx and cervical esophagus during swallowing were not obtained due to early termination of the procedure. Gross laryngeal penetration and aspiration of contrast were identified during the initial swallows of thick barium. Contrast was seen traveling down the RIGHT lateral wall of the trachea into the RIGHT mainstem bronchus with the patient elevated to 45 degrees. No spontaneous cough. Hiatal hernia:  Not identified GE reflux:  Not identified during the period of imaging. Other:  N/A IMPRESSION: Gross laryngeal penetration and aspiration of contrast with barium extending down the RIGHT lateral wall of the trachea into the RIGHT mainstem bronchus without identification of a spontaneous cough reflex. This necessitated early termination of the procedure. 12.5 mm diameter barium tablet passed to the distal esophagus but did not pass into the stomach, potentially due to narrowing of the distal esophagus at the gastroesophageal junction though cannot completely exclude a component of esophageal dysmotility. Electronically Signed   By: Ulyses Southward M.D.   On: 01/31/2017 15:38   Dg Swallowing Func-speech Pathology  Result Date:  02/01/2017 Objective Swallowing Evaluation: Type of Study: MBS-Modified Barium Swallow Study Patient Details Name: Yolanda [name] MRN: [id number] Date of Birth: 1941/06/19 Today's Date: 02/01/2017 Time: SLP Start Time (ACUTE ONLY): 1408-SLP Stop Time (ACUTE ONLY): 1438 SLP Time Calculation (min) (ACUTE ONLY): 30 min Past Medical History: Past Medical History: Diagnosis Date . Barrett esophagus  . Bronchitis  . Cellulitis  . COPD (chronic obstructive pulmonary disease) (HCC)  . Diastolic heart failure (HCC)  . Essential hypertension  . GERD (gastroesophageal reflux disease)  . Hepatomegaly   ??? . Hypothyroidism  . PAF (paroxysmal atrial fibrillation) (HCC)   Eliquis . Stage III chronic kidney disease  Past Surgical History: Past Surgical History: Procedure Laterality Date . BREAST BIOPSY Right Sept, 2012 . CHOLECYSTECTOMY   . COLONOSCOPY   . UPPER GASTROINTESTINAL ENDOSCOPY   HPI: 76 y.o. female with medical history significant of Afib on Eliquis, Stage 3 CKD, hypothyroidism, GERD, HTN, diastolic heart failure, and COPD. Dx: Hyponatremia, and [name] LE cellulitis. Pt had BPE yesterday and experienced silent laryngeal penetration and aspiration with liquids during the study. SLP consult received and MBSS ordered due to known aspiration on study yesterday. Subjective: "I have had trouble with pills lately." Assessment / Plan / Recommendation CHL IP CLINICAL IMPRESSIONS 02/01/2017 Clinical Impression Mild oral phase dysphagia characterized by delayed oral transit with puree and barium tablet and pharyngeal phase essentially WFL with trace penetration of thins noted with sequential straw sips. No aspiration noted and no significant residuals. Esophageal sweep completed several times throughout the examination did reveal barium filled esophagus suggestive of delayed emptying and dysmotility. Barium tablet remained near GE junction despite puree and liquid wash. Esophagus never completely cleared. Pt did demonstrate silent  aspiration of barium contrast yesterday during BPE, however Pt was in semi-reclined position so this likely  negatively impacted pharyngeal phase. Recommend GI f/u (already spoke with Dr. Laural Golden who may dilate Pt on Monday). D3/mech soft and thin liquids with aspiration and reflux precautions. Reflux precautions reviewed extensively with Pt. No further SLP f/u indicated at this time. SLP Visit Diagnosis Dysphagia, pharyngoesophageal phase (R13.14) Attention and concentration deficit following -- Frontal lobe and executive function deficit following -- Impact on safety and function Mild aspiration risk   CHL IP TREATMENT RECOMMENDATION 02/01/2017 Treatment Recommendations No treatment recommended at this time   Prognosis 02/01/2017 Prognosis for Safe Diet Advancement Good Barriers to Reach Goals -- Barriers/Prognosis Comment -- CHL IP DIET RECOMMENDATION 02/01/2017 SLP Diet Recommendations Dysphagia 3 (Mech soft) solids;Thin liquid Liquid Administration via Cup;Straw Medication Administration Whole meds with liquid Compensations Multiple dry swallows after each bite/sip Postural Changes Remain semi-upright after after feeds/meals (Comment);Seated upright at 90 degrees   CHL IP OTHER RECOMMENDATIONS 02/01/2017 Recommended Consults Consider GI evaluation;Consider esophageal assessment Oral Care Recommendations Oral care BID;Patient independent with oral care Other Recommendations Clarify dietary restrictions   CHL IP FOLLOW UP RECOMMENDATIONS 02/01/2017 Follow up Recommendations None   No flowsheet data found.     CHL IP ORAL PHASE 02/01/2017 Oral Phase Impaired Oral - Pudding Teaspoon -- Oral - Pudding Cup -- Oral - Honey Teaspoon -- Oral - Honey Cup -- Oral - Nectar Teaspoon -- Oral - Nectar Cup -- Oral - Nectar Straw -- Oral - Thin Teaspoon -- Oral - Thin Cup -- Oral - Thin Straw -- Oral - Puree Lingual pumping;Reduced posterior propulsion;Delayed oral transit Oral - Mech Soft -- Oral - Regular -- Oral - Multi-Consistency  -- Oral - Pill Delayed oral transit Oral Phase - Comment --  CHL IP PHARYNGEAL PHASE 02/01/2017 Pharyngeal Phase Impaired Pharyngeal- Pudding Teaspoon -- Pharyngeal -- Pharyngeal- Pudding Cup -- Pharyngeal -- Pharyngeal- Honey Teaspoon -- Pharyngeal -- Pharyngeal- Honey Cup -- Pharyngeal -- Pharyngeal- Nectar Teaspoon -- Pharyngeal -- Pharyngeal- Nectar Cup -- Pharyngeal -- Pharyngeal- Nectar Straw -- Pharyngeal -- Pharyngeal- Thin Teaspoon -- Pharyngeal -- Pharyngeal- Thin Cup WFL Pharyngeal -- Pharyngeal- Thin Straw Delayed swallow initiation-pyriform sinuses;Delayed swallow initiation-vallecula;Penetration/Aspiration during swallow Pharyngeal Material does not enter airway;Material enters airway, remains ABOVE vocal cords then ejected out Pharyngeal- Puree WFL Pharyngeal -- Pharyngeal- Mechanical Soft -- Pharyngeal -- Pharyngeal- Regular WFL Pharyngeal -- Pharyngeal- Multi-consistency -- Pharyngeal -- Pharyngeal- Pill WFL Pharyngeal -- Pharyngeal Comment --  CHL IP CERVICAL ESOPHAGEAL PHASE 02/01/2017 Cervical Esophageal Phase Impaired Pudding Teaspoon -- Pudding Cup -- Honey Teaspoon -- Honey Cup -- Nectar Teaspoon -- Nectar Cup -- Nectar Straw -- Thin Teaspoon -- Thin Cup -- Thin Straw -- Puree Esophageal backflow into cervical esophagus Mechanical Soft -- Regular -- Multi-consistency -- Pill -- Cervical Esophageal Comment -- Thank you, Genene Churn, [place] No flowsheet data found. PORTER,DABNEY 02/01/2017, 3:11 PM               Medications:  Prior to Admission:  Prescriptions Prior to Admission  Medication Sig Dispense Refill Last Dose  . acetaminophen (TYLENOL) 325 MG tablet Take 2 tablets (650 mg total) by mouth every 6 (six) hours as needed for mild pain (or Fever >/= 101).   01/28/2017 at Unknown time  . albuterol (PROVENTIL HFA;VENTOLIN HFA) 108 (90 Base) MCG/ACT inhaler Inhale 2 puffs into the lungs 4 (four) times daily.    01/28/2017 at Unknown time  . apixaban (ELIQUIS) 5 MG TABS  tablet Take 1 tablet (5 mg total) by mouth 2 (two) times daily. 60 tablet 5 01/28/2017  at [place]  . calcium carbonate (TUMS - DOSED IN MG ELEMENTAL CALCIUM) 500 MG chewable tablet Chew 1 tablet by mouth 2 (two) times daily.   01/28/2017 at Unknown time  . cetirizine (ZYRTEC) 10 MG tablet Take 10 mg by mouth daily.   01/28/2017 at Unknown time  . cholecalciferol (VITAMIN D) 1000 units tablet Take 1,000 Units by mouth every morning.   01/28/2017 at Unknown time  . cholecalciferol (VITAMIN D) 400 units TABS tablet Take 400 Units by mouth daily.   01/28/2017 at Unknown time  . colchicine 0.6 MG tablet Take 0.6 mg by mouth 2 (two) times daily.   01/28/2017 at Unknown time  . diltiazem (CARDIZEM CD) 120 MG 24 hr capsule Take 1 capsule (120 mg total) by mouth daily. 90 capsule 3 01/28/2017 at Unknown time  . fluticasone furoate-vilanterol (BREO ELLIPTA) 200-25 MCG/INH AEPB Inhale 1 puff into the lungs daily.   01/28/2017 at Unknown time  . gabapentin (NEURONTIN) 100 MG capsule Take 100 mg by mouth 2 (two) times daily.   01/28/2017 at [place]  . HYDROcodone-acetaminophen (NORCO) 7.5-325 MG tablet Take 1 tablet by mouth 3 (three) times daily as needed for moderate pain.   01/27/2017 at Unknown time  . insulin glargine (LANTUS) 100 UNIT/ML injection Inject 0.2 mLs (20 Units total) into the skin daily. 10 mL 11 01/27/2017 at 2000  . insulin lispro (HUMALOG) 100 UNIT/ML injection Inject 3-20 Units into the skin 3 (three) times daily before meals. 70-100= 0 units 101-150=3 units 151-200=4 units 201-250=8 units 251-300=12units 301-350=16units Greater than 350= 20 units   01/28/2017 at Unknown time  . levothyroxine (SYNTHROID, LEVOTHROID) 137 MCG tablet Take 137 mcg by mouth daily before breakfast.   01/28/2017 at Unknown time  . Magnesium 250 MG TABS Take 2 tablets by mouth 2 (two) times daily.    01/28/2017 at Unknown time  . metoprolol tartrate (LOPRESSOR) 25 MG tablet TAKE 1/2 TABLET BY MOUTH TWICE DAILY 90 tablet 1 01/28/2017 at 800a  .  Multiple Vitamin (MULTIVITAMIN WITH MINERALS) TABS tablet Take 1 tablet by mouth daily.   01/28/2017 at Unknown time  . omeprazole (PRILOSEC) 20 MG capsule TAKE ONE CAPSULE BY MOUTH DAILY 90 capsule 3 01/28/2017 at Unknown time  . ondansetron (ZOFRAN) 4 MG tablet Take 1 tablet (4 mg total) by mouth every 6 (six) hours as needed for nausea. 20 tablet 0 01/28/2017 at Unknown time  . potassium chloride SA (K-DUR,KLOR-CON) 20 MEQ tablet Take 1 tablet (20 mEq total) by mouth daily.   01/28/2017 at Unknown time  . torsemide (DEMADEX) 20 MG tablet Take 1 tablet (20 mg total) by mouth 2 (two) times daily. (may take an extra '20mg'$  as needed)   01/28/2017 at Unknown time  . triamcinolone (NASACORT ALLERGY 24HR) 55 MCG/ACT AERO nasal inhaler Place 2 sprays into the nose daily as needed (for allergies).    unknown  . feeding supplement, GLUCERNA SHAKE, (GLUCERNA SHAKE) LIQD Take 237 mLs by mouth 3 (three) times daily between meals.  0   . gabapentin (NEURONTIN) 300 MG capsule Take 1 capsule (300 mg total) by mouth 2 (two) times daily. (Patient not taking: Reported on 01/28/2017)   Not Taking at Unknown time  . polyvinyl alcohol (LIQUIFILM TEARS) 1.4 % ophthalmic solution Place 1 drop into both eyes 4 (four) times daily as needed for dry eyes. 15 mL 0 unknown   Scheduled: . albuterol  3 mL Inhalation TID  . colchicine  0.3 mg Oral Daily  . docusate  sodium  100 mg Oral BID  . feeding supplement (GLUCERNA SHAKE)  237 mL Oral BID BM  . fluticasone furoate-vilanterol  1 puff Inhalation Daily  . gabapentin  300 mg Oral BID  . triamcinolone cream   Topical BID   And  . hydrocerin   Topical BID  . insulin aspart  0-15 Units Subcutaneous TID WC  . levothyroxine  137 mcg Oral QAC breakfast  . loratadine  10 mg Oral Daily  . metoprolol tartrate  12.5 mg Oral BID  . pantoprazole  40 mg Oral Daily  . potassium chloride  20 mEq Oral BID  . sodium chloride flush  3 mL Intravenous Q12H  . vancomycin  250 mg Oral Q6H    Continuous: . 0.45 % NaCl with KCl 20 mEq / [name] 75 mL/hr at 02/02/17 0912  . ferumoxytol     GUY:[id number] **OR** acetaminophen, HYDROcodone-acetaminophen, ondansetron **OR** ondansetron (ZOFRAN) IV, polyvinyl alcohol, zolpidem  Assesment: She was admitted with significant hyponatremia which has essentially resolved now.   She had cellulitis of left leg which looks okay now  At baseline she has COPD and she is doing well with that.  She is chronically anticoagulated because of paroxysmal atrial fib but that is being held so she can have EGD and potential dilatation early next week  She has sleep apnea and does not wear her CPAP generally she is using it some here in the hospital  She had acute on chronic renal failure which is much improved  She has anemia multifactorial and she does not absorb iron well so I'm going to give her IV iron today  She has hypertension which is well controlled  She has diabetes on sliding scale insulin  She had trouble with swallowing and speech is involved and dietary recommendations are being followed  She has extreme deconditioning and had been in a skilled care facility for rehabilitation and will need to go back  She had elevated LFTs which I think are multifactorial and this will be followed Principal Problem:   Hyponatremia Active Problems:   HTN (hypertension)   COPD (chronic obstructive pulmonary disease) (HCC)   Neuropathic pain   Hypothyroidism   PAF (paroxysmal atrial fibrillation) (HCC)   Cellulitis of left lower extremity   Chronic anticoagulation   Sleep apnea   Gout attack   Hyperglycemia   Elevated LFTs   Diabetes mellitus type 2 in obese ([place])    Plan: Continue current treatments. We'll transfer out of stepdown tomorrow assuming that she continues to improve. IV iron today.    LOS: 5 days   Yolanda [name] 02/02/2017, 9:38 AM

## 2017-02-02 NOTE — Progress Notes (Addendum)
Subjective: Interval History: Patient offers no complaints. She's feeling much better.  Objective: Vital signs in last 24 hours: Temp:  [97.8 F (36.6 C)-98.6 F (37 C)] 98.4 F (36.9 C) (05/12 0711) Pulse Rate:  [92-108] 104 (05/12 0711) Resp:  [10-20] 20 (05/12 0711) BP: (101-142)/(37-74) 105/52 (05/12 0700) SpO2:  [87 %-100 %] 99 % (05/12 0733) Weight:  [101.2 kg (223 lb 1.7 oz)] 101.2 kg (223 lb 1.7 oz) (05/12 0400) Weight change: -0.6 kg (-1 lb 5.2 oz)  Intake/Output from previous day: 05/11 0701 - 05/12 0700 In: 3480 [P.O.:480; I.V.:3000] Out: 1025 [Urine:1025] Intake/Output this shift: No intake/output data recorded.  General appearance: alert, cooperative and no distress Resp: diminished breath sounds bilaterally and wheezes bilaterally Cardio: regular rate and rhythm Extremities: extremities normal, atraumatic, no cyanosis or edema  Lab Results:  Recent Labs  02/01/17 0417 02/02/17 0500  WBC 4.6 4.9  HGB 7.4* 7.5*  HCT 22.1* 22.0*  PLT 258 276   BMET:   Recent Labs  02/01/17 0417 02/02/17 0500  NA 124* 133*  134*  K 3.6 3.0*  3.0*  CL 93* 101  101  CO2 23 26  26   GLUCOSE 73 86  88  BUN 16 13  13   CREATININE 2.49* 1.51*  1.51*  CALCIUM 7.1* 7.1*  7.1*   No results for input(s): PTH in the last 72 hours. Iron Studies:   Recent Labs  02/01/17 0920  IRON 114  TIBC 195*    Studies/Results: Dg Esophagus  Result Date: 01/31/2017 CLINICAL DATA:  Dysphagia EXAM: ESOPHOGRAM / BARIUM SWALLOW / BARIUM TABLET STUDY TECHNIQUE: Combined double contrast and single contrast examination performed using effervescent crystals, thick barium liquid, and thin barium liquid. The patient was observed with fluoroscopy swallowing a 13 mm barium sulphate tablet. Study performed with head of bed elevated 30-45 degrees during the exam. Due to aspiration, procedure was terminated prematurely. FLUOROSCOPY TIME:  Fluoroscopy Time:  2 minutes 42 seconds Radiation  Exposure Index (if provided by the fluoroscopic device): 38.2 mGy Number of Acquired Spot Images: multiple fluoroscopic screen captures COMPARISON:  None FINDINGS: Esophageal distention: Question mall narrowing at the gastroesophageal junction. Remainder of esophagus appears to distend normally. Filling defects:  None 12.5 mm barium tablet: Did not pass beyond the distal esophagus and the stomach, though uncertain if this is due to obstruction or severe dysmotility. Multiple swallows of water or unable to pass the pill into the stomach. Motility:  Diffusely impaired Mucosa: On limited assessment walls appeared smooth without irregularity or ulceration. Hypopharynx/cervical esophagus: Dedicated AP and lateral views of the hypopharynx and cervical esophagus during swallowing were not obtained due to early termination of the procedure. Gross laryngeal penetration and aspiration of contrast were identified during the initial swallows of thick barium. Contrast was seen traveling down the RIGHT lateral wall of the trachea into the RIGHT mainstem bronchus with the patient elevated to 45 degrees. No spontaneous cough. Hiatal hernia:  Not identified GE reflux:  Not identified during the period of imaging. Other:  N/A IMPRESSION: Gross laryngeal penetration and aspiration of contrast with barium extending down the RIGHT lateral wall of the trachea into the RIGHT mainstem bronchus without identification of a spontaneous cough reflex. This necessitated early termination of the procedure. 12.5 mm diameter barium tablet passed to the distal esophagus but did not pass into the stomach, potentially due to narrowing of the distal esophagus at the gastroesophageal junction though cannot completely exclude a component of esophageal dysmotility. Electronically Signed  By: Ulyses Southward M.D.   On: 01/31/2017 15:38   Dg Swallowing Func-speech Pathology  Result Date: 02/01/2017 Objective Swallowing Evaluation: Type of Study:  MBS-Modified Barium Swallow Study Patient Details Name: Yolanda [name] MRN: [id number] Date of Birth: 05/19/1941 Today's Date: 02/01/2017 Time: SLP Start Time (ACUTE ONLY): 1408-SLP Stop Time (ACUTE ONLY): 1438 SLP Time Calculation (min) (ACUTE ONLY): 30 min Past Medical History: Past Medical History: Diagnosis Date . Barrett esophagus  . Bronchitis  . Cellulitis  . COPD (chronic obstructive pulmonary disease) (HCC)  . Diastolic heart failure (HCC)  . Essential hypertension  . GERD (gastroesophageal reflux disease)  . Hepatomegaly   ??? . Hypothyroidism  . PAF (paroxysmal atrial fibrillation) (HCC)   Eliquis . Stage III chronic kidney disease  Past Surgical History: Past Surgical History: Procedure Laterality Date . BREAST BIOPSY Right Sept, 2012 . CHOLECYSTECTOMY   . COLONOSCOPY   . UPPER GASTROINTESTINAL ENDOSCOPY   HPI: 76 y.o. female with medical history significant of Afib on Eliquis, Stage 3 CKD, hypothyroidism, GERD, HTN, diastolic heart failure, and COPD. Dx: Hyponatremia, and L LE cellulitis. Pt had BPE yesterday and experienced silent laryngeal penetration and aspiration with liquids during the study. SLP consult received and MBSS ordered due to known aspiration on study yesterday. Subjective: "I have had trouble with pills lately." Assessment / Plan / Recommendation CHL IP CLINICAL IMPRESSIONS 02/01/2017 Clinical Impression Mild oral phase dysphagia characterized by delayed oral transit with puree and barium tablet and pharyngeal phase essentially WFL with trace penetration of thins noted with sequential straw sips. No aspiration noted and no significant residuals. Esophageal sweep completed several times throughout the examination did reveal barium filled esophagus suggestive of delayed emptying and dysmotility. Barium tablet remained near GE junction despite puree and liquid wash. Esophagus never completely cleared. Pt did demonstrate silent aspiration of barium contrast yesterday during BPE, however Pt was  in semi-reclined position so this likely negatively impacted pharyngeal phase. Recommend GI f/u (already spoke with Dr. Karilyn Cota who may dilate Pt on Monday). D3/mech soft and thin liquids with aspiration and reflux precautions. Reflux precautions reviewed extensively with Pt. No further SLP f/u indicated at this time. SLP Visit Diagnosis Dysphagia, pharyngoesophageal phase (R13.14) Attention and concentration deficit following -- Frontal lobe and executive function deficit following -- Impact on safety and function Mild aspiration risk   CHL IP TREATMENT RECOMMENDATION 02/01/2017 Treatment Recommendations No treatment recommended at this time   Prognosis 02/01/2017 Prognosis for Safe Diet Advancement Good Barriers to Reach Goals -- Barriers/Prognosis Comment -- CHL IP DIET RECOMMENDATION 02/01/2017 SLP Diet Recommendations Dysphagia 3 (Mech soft) solids;Thin liquid Liquid Administration via Cup;Straw Medication Administration Whole meds with liquid Compensations Multiple dry swallows after each bite/sip Postural Changes Remain semi-upright after after feeds/meals (Comment);Seated upright at 90 degrees   CHL IP OTHER RECOMMENDATIONS 02/01/2017 Recommended Consults Consider GI evaluation;Consider esophageal assessment Oral Care Recommendations Oral care BID;Patient independent with oral care Other Recommendations Clarify dietary restrictions   CHL IP FOLLOW UP RECOMMENDATIONS 02/01/2017 Follow up Recommendations None   No flowsheet data found.     CHL IP ORAL PHASE 02/01/2017 Oral Phase Impaired Oral - Pudding Teaspoon -- Oral - Pudding Cup -- Oral - Honey Teaspoon -- Oral - Honey Cup -- Oral - Nectar Teaspoon -- Oral - Nectar Cup -- Oral - Nectar Straw -- Oral - Thin Teaspoon -- Oral - Thin Cup -- Oral - Thin Straw -- Oral - Puree Lingual pumping;Reduced posterior propulsion;Delayed oral transit Oral - Mech Soft --  Oral - Regular -- Oral - Multi-Consistency -- Oral - Pill Delayed oral transit Oral Phase - Comment --  CHL IP  PHARYNGEAL PHASE 02/01/2017 Pharyngeal Phase Impaired Pharyngeal- Pudding Teaspoon -- Pharyngeal -- Pharyngeal- Pudding Cup -- Pharyngeal -- Pharyngeal- Honey Teaspoon -- Pharyngeal -- Pharyngeal- Honey Cup -- Pharyngeal -- Pharyngeal- Nectar Teaspoon -- Pharyngeal -- Pharyngeal- Nectar Cup -- Pharyngeal -- Pharyngeal- Nectar Straw -- Pharyngeal -- Pharyngeal- Thin Teaspoon -- Pharyngeal -- Pharyngeal- Thin Cup WFL Pharyngeal -- Pharyngeal- Thin Straw Delayed swallow initiation-pyriform sinuses;Delayed swallow initiation-vallecula;Penetration/Aspiration during swallow Pharyngeal Material does not enter airway;Material enters airway, remains ABOVE vocal cords then ejected out Pharyngeal- Puree WFL Pharyngeal -- Pharyngeal- Mechanical Soft -- Pharyngeal -- Pharyngeal- Regular WFL Pharyngeal -- Pharyngeal- Multi-consistency -- Pharyngeal -- Pharyngeal- Pill WFL Pharyngeal -- Pharyngeal Comment --  CHL IP CERVICAL ESOPHAGEAL PHASE 02/01/2017 Cervical Esophageal Phase Impaired Pudding Teaspoon -- Pudding Cup -- Honey Teaspoon -- Honey Cup -- Nectar Teaspoon -- Nectar Cup -- Nectar Straw -- Thin Teaspoon -- Thin Cup -- Thin Straw -- Puree Esophageal backflow into cervical esophagus Mechanical Soft -- Regular -- Multi-consistency -- Pill -- Cervical Esophageal Comment -- Thank you, Yolanda [name], Yolanda [name] [phone] No flowsheet data found. [name],DABNEY 02/01/2017, 3:11 PM               I have reviewed the patient's current medications.  Assessment/Plan: Problem #1 acute kidney injury superimposed on chronic. Presently patient is nonoliguric. Etiology was thought to be secondary to prerenal syndrome versus ATN. Renal function continued to improve. Presently reaching her baseline. Problem #2 chronic renal failure: Stage III. Thought to be secondary to diabetes/hypertension/ischemic/recurrent AK I Problem #3 hyponatremia: Thought to be secondary hypovolemic hyponatremia. Her sodium is 133 and improving. Problem #4  anemia: Her hemoglobin is low. Most likely iron deficiency anemia but anemia of chronic disease cannot be ruled out.Marland Kitchen. Her iron saturation is good but ferritin is pending. Problem #5 history of COPD: She denies any difficulty breathing. Problem #6 history of hypothyroidism: On Synthroid Problem #7 diabetes: Her blood sugar is reasonably controlled Problem #8 hypokalemia: Her potassium declined possibly secondary to Lasix. Plan: 1] we will DC normal saline and Lasix 2] we'll start her on half normal saline with 20 mEq of KCl at 75 mL per hour 3] will check a renal panel in the morning    LOS: 5 days   Tristina Sahagian S 02/02/2017,7:55 AM

## 2017-02-03 LAB — RENAL FUNCTION PANEL
Albumin: 2.1 g/dL — ABNORMAL LOW (ref 3.5–5.0)
Anion gap: 6 (ref 5–15)
BUN: 8 mg/dL (ref 6–20)
CO2: 24 mmol/L (ref 22–32)
CREATININE: 0.95 mg/dL (ref 0.44–1.00)
Calcium: 7.3 mg/dL — ABNORMAL LOW (ref 8.9–10.3)
Chloride: 102 mmol/L (ref 101–111)
GFR, EST NON AFRICAN AMERICAN: 58 mL/min — AB (ref >60)
Glucose, Bld: 118 mg/dL — ABNORMAL HIGH (ref 65–99)
PHOSPHORUS: 3.2 mg/dL (ref 2.5–4.6)
Potassium: 4.3 mmol/L (ref 3.5–5.1)
Sodium: 132 mmol/L — ABNORMAL LOW (ref 135–145)

## 2017-02-03 LAB — GLUCOSE, CAPILLARY
GLUCOSE-CAPILLARY: 137 mg/dL — AB (ref 65–99)
GLUCOSE-CAPILLARY: 118 mg/dL — AB (ref 65–99)
Glucose-Capillary: 92 mg/dL (ref 65–99)
Glucose-Capillary: 111 mg/dL — ABNORMAL HIGH (ref 65–99)

## 2017-02-03 MED ORDER — VANCOMYCIN 50 MG/ML ORAL SOLUTION
ORAL | Status: AC
  Filled 2017-02-03: qty 5

## 2017-02-03 MED ORDER — ALLOPURINOL 300 MG PO TABS
150.0000 mg | ORAL_TABLET | Freq: Every day | ORAL | Status: DC
  Administered 2017-02-03 – 2017-02-06 (×4): 150 mg via ORAL
  Filled 2017-02-03 (×4): qty 1

## 2017-02-03 NOTE — Progress Notes (Signed)
Patient resting In bed with eyes closed. Vital signs are stable. Denies pain. Report called to Misty StanleyLisa, Charity fundraiserN. Patient to be transferred via bed to room 313.

## 2017-02-03 NOTE — Progress Notes (Signed)
Subjective: Interval History: Patient is feeling much better. She denies any difficulty breathing. She does have also any nausea or vomiting.  Objective: Vital signs in last 24 hours: Temp:  [97.6 F (36.4 C)-99.4 F (37.4 C)] 98.3 F (36.8 C) (05/13 0743) Pulse Rate:  [102-117] 105 (05/13 0700) Resp:  [11-23] 14 (05/13 0700) BP: (80-108)/(32-65) 108/53 (05/13 0700) SpO2:  [92 %-100 %] 95 % (05/13 0737) Weight:  [101.3 kg (223 lb 5.2 oz)] 101.3 kg (223 lb 5.2 oz) (05/13 0500) Weight change: 0.1 kg (3.5 oz)  Intake/Output from previous day: 05/12 0701 - 05/13 0700 In: 840 [P.O.:840] Out: -  Intake/Output this shift: No intake/output data recorded.  General appearance: alert, cooperative and no distress Resp: diminished breath sounds bilaterally and wheezes bilaterally Cardio: regular rate and rhythm Extremities: extremities normal, atraumatic, no cyanosis or edema  Lab Results:  Recent Labs  02/01/17 0417 02/02/17 0500  WBC 4.6 4.9  HGB 7.4* 7.5*  HCT 22.1* 22.0*  PLT 258 276   BMET:   Recent Labs  02/01/17 0417 02/02/17 0500  NA 124* 133*  134*  K 3.6 3.0*  3.0*  CL 93* 101  101  CO2 23 26  26   GLUCOSE 73 86  88  BUN 16 13  13   CREATININE 2.49* 1.51*  1.51*  CALCIUM 7.1* 7.1*  7.1*   No results for input(s): PTH in the last 72 hours. Iron Studies:   Recent Labs  02/01/17 0920 02/02/17 0020  IRON 114  --   TIBC 195*  --   FERRITIN  --  688*    Studies/Results: Dg Swallowing Func-speech Pathology  Result Date: 02/01/2017 Objective Swallowing Evaluation: Type of Study: MBS-Modified Barium Swallow Study Patient Details Name: Yolanda [name] MRN: [id number] Date of Birth: [birth year] Today's Date: 02/01/2017 Time: SLP Start Time (ACUTE ONLY): 1408-SLP Stop Time (ACUTE ONLY): 1438 SLP Time Calculation (min) (ACUTE ONLY): 30 min Past Medical History: Past Medical History: Diagnosis Date . Barrett esophagus  . Bronchitis  . Cellulitis  . COPD (chronic  obstructive pulmonary disease) (HCC)  . Diastolic heart failure (HCC)  . Essential hypertension  . GERD (gastroesophageal reflux disease)  . Hepatomegaly   ??? . Hypothyroidism  . PAF (paroxysmal atrial fibrillation) (HCC)   Eliquis . Stage III chronic kidney disease  Past Surgical History: Past Surgical History: Procedure Laterality Date . BREAST BIOPSY Right Sept, 2012 . CHOLECYSTECTOMY   . COLONOSCOPY   . UPPER GASTROINTESTINAL ENDOSCOPY   HPI: 76 y.o. female with medical history significant of Afib on Eliquis, Stage 3 CKD, hypothyroidism, GERD, HTN, diastolic heart failure, and COPD. Dx: Hyponatremia, and L LE cellulitis. Pt had BPE yesterday and experienced silent laryngeal penetration and aspiration with liquids during the study. SLP consult received and MBSS ordered due to known aspiration on study yesterday. Subjective: "I have had trouble with pills lately." Assessment / Plan / Recommendation CHL IP CLINICAL IMPRESSIONS 02/01/2017 Clinical Impression Mild oral phase dysphagia characterized by delayed oral transit with puree and barium tablet and pharyngeal phase essentially WFL with trace penetration of thins noted with sequential straw sips. No aspiration noted and no significant residuals. Esophageal sweep completed several times throughout the examination did reveal barium filled esophagus suggestive of delayed emptying and dysmotility. Barium tablet remained near GE junction despite puree and liquid wash. Esophagus never completely cleared. Pt did demonstrate silent aspiration of barium contrast yesterday during BPE, however Pt was in semi-reclined position so this likely negatively impacted pharyngeal phase. Recommend GI  f/u (already spoke with Dr. Karilyn Cota who may dilate Pt on Monday). D3/mech soft and thin liquids with aspiration and reflux precautions. Reflux precautions reviewed extensively with Pt. No further SLP f/u indicated at this time. SLP Visit Diagnosis Dysphagia, pharyngoesophageal phase  (R13.14) Attention and concentration deficit following -- Frontal lobe and executive function deficit following -- Impact on safety and function Mild aspiration risk   CHL IP TREATMENT RECOMMENDATION 02/01/2017 Treatment Recommendations No treatment recommended at this time   Prognosis 02/01/2017 Prognosis for Safe Diet Advancement Good Barriers to Reach Goals -- Barriers/Prognosis Comment -- CHL IP DIET RECOMMENDATION 02/01/2017 SLP Diet Recommendations Dysphagia 3 (Mech soft) solids;Thin liquid Liquid Administration via Cup;Straw Medication Administration Whole meds with liquid Compensations Multiple dry swallows after each bite/sip Postural Changes Remain semi-upright after after feeds/meals (Comment);Seated upright at 90 degrees   CHL IP OTHER RECOMMENDATIONS 02/01/2017 Recommended Consults Consider GI evaluation;Consider esophageal assessment Oral Care Recommendations Oral care BID;Patient independent with oral care Other Recommendations Clarify dietary restrictions   CHL IP FOLLOW UP RECOMMENDATIONS 02/01/2017 Follow up Recommendations None   No flowsheet data found.     CHL IP ORAL PHASE 02/01/2017 Oral Phase Impaired Oral - Pudding Teaspoon -- Oral - Pudding Cup -- Oral - Honey Teaspoon -- Oral - Honey Cup -- Oral - Nectar Teaspoon -- Oral - Nectar Cup -- Oral - Nectar Straw -- Oral - Thin Teaspoon -- Oral - Thin Cup -- Oral - Thin Straw -- Oral - Puree Lingual pumping;Reduced posterior propulsion;Delayed oral transit Oral - Mech Soft -- Oral - Regular -- Oral - Multi-Consistency -- Oral - Pill Delayed oral transit Oral Phase - Comment --  CHL IP PHARYNGEAL PHASE 02/01/2017 Pharyngeal Phase Impaired Pharyngeal- Pudding Teaspoon -- Pharyngeal -- Pharyngeal- Pudding Cup -- Pharyngeal -- Pharyngeal- Honey Teaspoon -- Pharyngeal -- Pharyngeal- Honey Cup -- Pharyngeal -- Pharyngeal- Nectar Teaspoon -- Pharyngeal -- Pharyngeal- Nectar Cup -- Pharyngeal -- Pharyngeal- Nectar Straw -- Pharyngeal -- Pharyngeal- Thin  Teaspoon -- Pharyngeal -- Pharyngeal- Thin Cup WFL Pharyngeal -- Pharyngeal- Thin Straw Delayed swallow initiation-pyriform sinuses;Delayed swallow initiation-vallecula;Penetration/Aspiration during swallow Pharyngeal Material does not enter airway;Material enters airway, remains ABOVE vocal cords then ejected out Pharyngeal- Puree WFL Pharyngeal -- Pharyngeal- Mechanical Soft -- Pharyngeal -- Pharyngeal- Regular WFL Pharyngeal -- Pharyngeal- Multi-consistency -- Pharyngeal -- Pharyngeal- Pill WFL Pharyngeal -- Pharyngeal Comment --  CHL IP CERVICAL ESOPHAGEAL PHASE 02/01/2017 Cervical Esophageal Phase Impaired Pudding Teaspoon -- Pudding Cup -- Honey Teaspoon -- Honey Cup -- Nectar Teaspoon -- Nectar Cup -- Nectar Straw -- Thin Teaspoon -- Thin Cup -- Thin Straw -- Puree Esophageal backflow into cervical esophagus Mechanical Soft -- Regular -- Multi-consistency -- Pill -- Cervical Esophageal Comment -- Thank you, Havery Moros, CCC-SLP [phone] No flowsheet data found. PORTER,DABNEY 02/01/2017, 3:11 PM               I have reviewed the patient's current medications.  Assessment/Plan: Problem #1 acute kidney injury superimposed on chronic. Remains non-oliguric and asymptomatic.. Etiology was thought to be secondary to prerenal syndrome versus ATN. Renal function has recovered. Problem #2 chronic renal failure: Stage III. Thought to be secondary to diabetes/hypertension/ischemic/recurrent AK I Problem #3 hyponatremia: Thought to be secondary hypovolemic hyponatremia. Her sodium is progressively improving. Her sodium today is slightly low but stable Problem #4 anemia: Her hemoglobin is low but stable. Her iron saturation and ferritin is high and hence this could be anemia of chronic disease. Problem #5 history of COPD: Denies any difficulty breathing. Problem #6  history of hypothyroidism: On Synthroid Problem #7 diabetes: Her blood sugar is reasonably controlled Problem #8 hypokalemia: Patient is on  potassium supplement. Her potassium is normal. Plan: 1] we will DC IV fluid 2] we'll DC freewater restriction 3] will check a renal panel in the morning    LOS: 6 days   Hosam Mcfetridge S 02/03/2017,8:10 AM

## 2017-02-03 NOTE — Progress Notes (Signed)
Subjective: Yolanda [name] says Yolanda [name] feels better. Yolanda [name] did receive IV iron yesterday. Her blood tests are pending this morning. Yolanda [name]'s not having any more diarrhea. Her breathing is okay.  Objective: Vital signs in last 24 hours: Temp:  [97.6 F (36.4 C)-99.4 F (37.4 C)] 98.3 F (36.8 C) (05/13 0743) Pulse Rate:  [102-117] 115 (05/13 0800) Resp:  [11-23] 12 (05/13 0800) BP: (80-117)/(32-95) 117/95 (05/13 0800) SpO2:  [92 %-100 %] 95 % (05/13 0800) Weight:  [101.3 kg (223 lb 5.2 oz)] 101.3 kg (223 lb 5.2 oz) (05/13 0500) Weight change: 0.1 kg (3.5 oz) Last BM Date: 02/03/17  Intake/Output from previous day: 05/12 0701 - 05/13 0700 In: 840 [P.O.:840] Out: -   PHYSICAL EXAM General appearance: alert, cooperative, no distress and morbidly obese Resp: clear to auscultation bilaterally Cardio: regular rate and rhythm, S1, S2 normal, no murmur, click, rub or gallop GI: soft, non-tender; bowel sounds normal; no masses,  no organomegaly Extremities: extremities normal, atraumatic, no cyanosis or edema Skin warm and dry. Mucous membranes are moist  Lab Results:  Results for orders placed or performed during the hospital encounter of 01/28/17 (from the past 48 hour(s))  Iron and TIBC     Status: Abnormal   Collection Time: 02/01/17  9:20 AM  Result Value Ref Range   Iron 114 28 - 170 ug/dL   TIBC 195 (L) 250 - 450 ug/dL   Saturation Ratios 59 (H) 10.4 - 31.8 %   UIBC 81 ug/dL    Comment: Performed at [facility] [address], [place], [place] [address]  Glucose, capillary     Status: None   Collection Time: 02/01/17 11:58 AM  Result Value Ref Range   Glucose-Capillary 80 65 - 99 mg/dL   Comment 1 Notify RN    Comment 2 Document in Chart   Glucose, capillary     Status: Abnormal   Collection Time: 02/01/17  4:31 PM  Result Value Ref Range   Glucose-Capillary 103 (H) 65 - 99 mg/dL   Comment 1 Notify RN    Comment 2 Document in Chart   Glucose, capillary     Status: Abnormal    Collection Time: 02/01/17  8:09 PM  Result Value Ref Range   Glucose-Capillary 111 (H) 65 - 99 mg/dL   Comment 1 Notify RN    Comment 2 Document in Chart   Ferritin     Status: Abnormal   Collection Time: 02/02/17 12:20 AM  Result Value Ref Range   Ferritin 688 (H) 11 - 307 ng/mL    Comment: Performed at [facility], [place] [address], [place], [place] [address]  CBC with Differential/Platelet     Status: Abnormal   Collection Time: 02/02/17  5:00 AM  Result Value Ref Range   WBC 4.9 4.0 - 10.5 K/uL   RBC 2.35 (L) 3.87 - 5.11 MIL/uL   Hemoglobin 7.5 (L) 12.0 - 15.0 g/dL   HCT 22.0 (L) 36.0 - 46.0 %   MCV 93.6 78.0 - 100.0 fL   MCH 31.9 26.0 - 34.0 pg   MCHC 34.1 30.0 - 36.0 g/dL   RDW 16.3 (H) 11.5 - 15.5 %   Platelets 276 150 - 400 K/uL   Neutrophils Relative % 61 %   Lymphocytes Relative 21 %   Monocytes Relative 16 %   Eosinophils Relative 1 %   Basophils Relative 1 %   Neutro Abs 2.9 1.7 - 7.7 K/uL   Lymphs Abs 1.0 0.7 -  4.0 K/uL   Monocytes Absolute 0.8 0.1 - 1.0 K/uL   Eosinophils Absolute 0.1 0.0 - 0.7 K/uL   Basophils Absolute 0.1 0.0 - 0.1 K/uL   RBC Morphology POLYCHROMASIA PRESENT    WBC Morphology INCREASED BANDS (>20% BANDS)   Basic metabolic panel     Status: Abnormal   Collection Time: 02/02/17  5:00 AM  Result Value Ref Range   Sodium 134 (L) 135 - 145 mmol/L    Comment: DELTA CHECK NOTED   Potassium 3.0 (L) 3.5 - 5.1 mmol/L   Chloride 101 101 - 111 mmol/L   CO2 26 22 - 32 mmol/L   Glucose, Bld 88 65 - 99 mg/dL   BUN 13 6 - 20 mg/dL   Creatinine, Ser 1.51 (H) 0.44 - 1.00 mg/dL   Calcium 7.1 (L) 8.9 - 10.3 mg/dL   GFR calc non Af Amer 33 (L) >60 mL/min   GFR calc Af Amer 38 (L) >60 mL/min    Comment: (NOTE) The eGFR has been calculated using the CKD EPI equation. This calculation has not been validated in all clinical situations. eGFR's persistently <60 mL/min signify possible Chronic Kidney Disease.    Anion gap 7 5 - 15  Renal function panel      Status: Abnormal   Collection Time: 02/02/17  5:00 AM  Result Value Ref Range   Sodium 133 (L) 135 - 145 mmol/L   Potassium 3.0 (L) 3.5 - 5.1 mmol/L   Chloride 101 101 - 111 mmol/L   CO2 26 22 - 32 mmol/L   Glucose, Bld 86 65 - 99 mg/dL   BUN 13 6 - 20 mg/dL   Creatinine, Ser 1.51 (H) 0.44 - 1.00 mg/dL   Calcium 7.1 (L) 8.9 - 10.3 mg/dL   Phosphorus 3.3 2.5 - 4.6 mg/dL   Albumin 1.9 (L) 3.5 - 5.0 g/dL   GFR calc non Af Amer 33 (L) >60 mL/min   GFR calc Af Amer 38 (L) >60 mL/min    Comment: (NOTE) The eGFR has been calculated using the CKD EPI equation. This calculation has not been validated in all clinical situations. eGFR's persistently <60 mL/min signify possible Chronic Kidney Disease.    Anion gap 6 5 - 15  Glucose, capillary     Status: None   Collection Time: 02/02/17  7:10 AM  Result Value Ref Range   Glucose-Capillary 89 65 - 99 mg/dL  Glucose, capillary     Status: Abnormal   Collection Time: 02/02/17 11:43 AM  Result Value Ref Range   Glucose-Capillary 112 (H) 65 - 99 mg/dL  Glucose, capillary     Status: Abnormal   Collection Time: 02/02/17  4:07 PM  Result Value Ref Range   Glucose-Capillary 140 (H) 65 - 99 mg/dL  Glucose, capillary     Status: Abnormal   Collection Time: 02/02/17  9:10 PM  Result Value Ref Range   Glucose-Capillary 135 (H) 65 - 99 mg/dL   Comment 1 Notify RN   Glucose, capillary     Status: None   Collection Time: 02/03/17  7:42 AM  Result Value Ref Range   Glucose-Capillary 92 65 - 99 mg/dL    ABGS No results for input(s): PHART, PO2ART, TCO2, HCO3 in the last 72 hours.  Invalid input(s): PCO2 CULTURES Recent Results (from the past 240 hour(s))  C difficile quick scan w PCR reflex     Status: Abnormal   Collection Time: 01/28/17  4:53 PM  Result Value Ref Range Status  C Diff antigen POSITIVE (A) NEGATIVE Final   C Diff toxin NEGATIVE NEGATIVE Final   C Diff interpretation Results are indeterminate. See PCR results.  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 May not need treatment unless significant symptoms are present. Performed at [facility], [address], [place], [place] [address]   MRSA PCR Screening     Status: None   Collection Time: 01/28/17 11:24 PM  Result Value Ref Range Status   MRSA by PCR NEGATIVE NEGATIVE Final    Comment:        The GeneXpert MRSA Assay (FDA approved for NASAL specimens only), is one component of a comprehensive MRSA colonization surveillance program. It is not intended to diagnose MRSA infection nor to guide or monitor treatment for MRSA infections.   Gastrointestinal Panel by PCR , Stool     Status: None   Collection Time: 01/29/17  3:15 PM  Result Value Ref Range Status   Campylobacter species NOT DETECTED NOT DETECTED Final   Plesimonas shigelloides NOT DETECTED NOT DETECTED Final   Salmonella species NOT DETECTED NOT DETECTED Final   Yersinia enterocolitica NOT DETECTED NOT DETECTED Final   Vibrio species NOT DETECTED NOT DETECTED Final   Vibrio cholerae NOT DETECTED NOT DETECTED Final   Enteroaggregative E coli (EAEC) NOT DETECTED NOT DETECTED Final   Enteropathogenic E coli (EPEC) NOT DETECTED NOT DETECTED Final   Enterotoxigenic E coli (ETEC) NOT DETECTED NOT DETECTED Final   Shiga like toxin producing E coli (STEC) NOT DETECTED NOT DETECTED Final   Shigella/Enteroinvasive E coli (EIEC) NOT DETECTED NOT DETECTED Final   Cryptosporidium NOT DETECTED NOT DETECTED Final   Cyclospora cayetanensis NOT DETECTED NOT DETECTED Final   Entamoeba histolytica NOT DETECTED NOT DETECTED Final   Giardia lamblia NOT DETECTED NOT DETECTED Final   Adenovirus F40/41 NOT DETECTED NOT DETECTED Final   Astrovirus NOT DETECTED NOT DETECTED Final   Norovirus GI/GII NOT  DETECTED NOT DETECTED Final   Rotavirus A NOT DETECTED NOT DETECTED Final   Sapovirus (I, II, IV, and V) NOT DETECTED NOT DETECTED Final   Studies/Results: Dg Swallowing Func-speech Pathology  Result Date: 02/01/2017 Objective Swallowing Evaluation: Type of Study: MBS-Modified Barium Swallow Study Yolanda [name] Details Name: Yolanda [name] MRN: [id number] Date of Birth: 05-05-1941 Today's Date: 02/01/2017 Time: SLP Start Time (ACUTE ONLY): 1408-SLP Stop Time (ACUTE ONLY): 1438 SLP Time Calculation (min) (ACUTE ONLY): 30 min Past Medical History: Past Medical History: Diagnosis Date . Barrett esophagus  . Bronchitis  . Cellulitis  . COPD (chronic obstructive pulmonary disease) (HCC)  . Diastolic heart failure (HCC)  . Essential hypertension  . GERD (gastroesophageal reflux disease)  . Hepatomegaly   ??? . Hypothyroidism  . PAF (paroxysmal atrial fibrillation) (HCC)   Eliquis . Stage III chronic kidney disease  Past Surgical History: Past Surgical History: Procedure Laterality Date . BREAST BIOPSY Right Sept, 2012 . CHOLECYSTECTOMY   . COLONOSCOPY   . UPPER GASTROINTESTINAL ENDOSCOPY   HPI: 76 y.o. female with medical history significant of Afib on Eliquis, Stage 3 CKD, hypothyroidism, GERD, HTN, diastolic heart failure, and COPD. Dx: Hyponatremia, and L LE cellulitis. Yolanda [name] had BPE yesterday and experienced silent laryngeal penetration and aspiration with liquids during the study. SLP consult  received and MBSS ordered due to known aspiration on study yesterday. Subjective: "I have had trouble with pills lately." Assessment / Plan / Recommendation CHL IP CLINICAL IMPRESSIONS 02/01/2017 Clinical Impression Mild oral phase dysphagia characterized by delayed oral transit with puree and barium tablet and pharyngeal phase essentially WFL with trace penetration of thins noted with sequential straw sips. No aspiration noted and no significant residuals. Esophageal sweep completed several times throughout the examination did reveal  barium filled esophagus suggestive of delayed emptying and dysmotility. Barium tablet remained near GE junction despite puree and liquid wash. Esophagus never completely cleared. Yolanda [name] did demonstrate silent aspiration of barium contrast yesterday during BPE, however Yolanda [name] was in semi-reclined position so this likely negatively impacted pharyngeal phase. Recommend GI f/u (already spoke with Dr. Laural Golden who may dilate Yolanda [name] on Monday). D3/mech soft and thin liquids with aspiration and reflux precautions. Reflux precautions reviewed extensively with Yolanda [name]. No further SLP f/u indicated at this time. SLP Visit Diagnosis Dysphagia, pharyngoesophageal phase (R13.14) Attention and concentration deficit following -- Frontal lobe and executive function deficit following -- Impact on safety and function Mild aspiration risk   CHL IP TREATMENT RECOMMENDATION 02/01/2017 Treatment Recommendations No treatment recommended at this time   Prognosis 02/01/2017 Prognosis for Safe Diet Advancement Good Barriers to Reach Goals -- Barriers/Prognosis Comment -- CHL IP DIET RECOMMENDATION 02/01/2017 SLP Diet Recommendations Dysphagia 3 (Mech soft) solids;Thin liquid Liquid Administration via Cup;Straw Medication Administration Whole meds with liquid Compensations Multiple dry swallows after each bite/sip Postural Changes Remain semi-upright after after feeds/meals (Comment);Seated upright at 90 degrees   CHL IP OTHER RECOMMENDATIONS 02/01/2017 Recommended Consults Consider GI evaluation;Consider esophageal assessment Oral Care Recommendations Oral care BID;Yolanda [name] independent with oral care Other Recommendations Clarify dietary restrictions   CHL IP FOLLOW UP RECOMMENDATIONS 02/01/2017 Follow up Recommendations None   No flowsheet data found.     CHL IP ORAL PHASE 02/01/2017 Oral Phase Impaired Oral - Pudding Teaspoon -- Oral - Pudding Cup -- Oral - Honey Teaspoon -- Oral - Honey Cup -- Oral - Nectar Teaspoon -- Oral - Nectar Cup -- Oral - Nectar Straw --  Oral - Thin Teaspoon -- Oral - Thin Cup -- Oral - Thin Straw -- Oral - Puree Lingual pumping;Reduced posterior propulsion;Delayed oral transit Oral - Mech Soft -- Oral - Regular -- Oral - Multi-Consistency -- Oral - Pill Delayed oral transit Oral Phase - Comment --  CHL IP PHARYNGEAL PHASE 02/01/2017 Pharyngeal Phase Impaired Pharyngeal- Pudding Teaspoon -- Pharyngeal -- Pharyngeal- Pudding Cup -- Pharyngeal -- Pharyngeal- Honey Teaspoon -- Pharyngeal -- Pharyngeal- Honey Cup -- Pharyngeal -- Pharyngeal- Nectar Teaspoon -- Pharyngeal -- Pharyngeal- Nectar Cup -- Pharyngeal -- Pharyngeal- Nectar Straw -- Pharyngeal -- Pharyngeal- Thin Teaspoon -- Pharyngeal -- Pharyngeal- Thin Cup WFL Pharyngeal -- Pharyngeal- Thin Straw Delayed swallow initiation-pyriform sinuses;Delayed swallow initiation-vallecula;Penetration/Aspiration during swallow Pharyngeal Material does not enter airway;Material enters airway, remains ABOVE vocal cords then ejected out Pharyngeal- Puree WFL Pharyngeal -- Pharyngeal- Mechanical Soft -- Pharyngeal -- Pharyngeal- Regular WFL Pharyngeal -- Pharyngeal- Multi-consistency -- Pharyngeal -- Pharyngeal- Pill WFL Pharyngeal -- Pharyngeal Comment --  CHL IP CERVICAL ESOPHAGEAL PHASE 02/01/2017 Cervical Esophageal Phase Impaired Pudding Teaspoon -- Pudding Cup -- Honey Teaspoon -- Honey Cup -- Nectar Teaspoon -- Nectar Cup -- Nectar Straw -- Thin Teaspoon -- Thin Cup -- Thin Straw -- Puree Esophageal backflow into cervical esophagus Mechanical Soft -- Regular -- Multi-consistency -- Pill -- Cervical Esophageal Comment -- Thank you, Genene Churn, [place] No flowsheet data found.  PORTER,DABNEY 02/01/2017, 3:11 PM               Medications:  Prior to Admission:  Prescriptions Prior to Admission  Medication Sig Dispense Refill Last Dose  . acetaminophen (TYLENOL) 325 MG tablet Take 2 tablets (650 mg total) by mouth every 6 (six) hours as needed for mild pain (or Fever >/= 101).   01/28/2017  at Unknown time  . albuterol (PROVENTIL HFA;VENTOLIN HFA) 108 (90 Base) MCG/ACT inhaler Inhale 2 puffs into the lungs 4 (four) times daily.    01/28/2017 at Unknown time  . apixaban (ELIQUIS) 5 MG TABS tablet Take 1 tablet (5 mg total) by mouth 2 (two) times daily. 60 tablet 5 01/28/2017 at [place]  . calcium carbonate (TUMS - DOSED IN MG ELEMENTAL CALCIUM) 500 MG chewable tablet Chew 1 tablet by mouth 2 (two) times daily.   01/28/2017 at Unknown time  . cetirizine (ZYRTEC) 10 MG tablet Take 10 mg by mouth daily.   01/28/2017 at Unknown time  . cholecalciferol (VITAMIN D) 1000 units tablet Take 1,000 Units by mouth every morning.   01/28/2017 at Unknown time  . cholecalciferol (VITAMIN D) 400 units TABS tablet Take 400 Units by mouth daily.   01/28/2017 at Unknown time  . colchicine 0.6 MG tablet Take 0.6 mg by mouth 2 (two) times daily.   01/28/2017 at Unknown time  . diltiazem (CARDIZEM CD) 120 MG 24 hr capsule Take 1 capsule (120 mg total) by mouth daily. 90 capsule 3 01/28/2017 at Unknown time  . fluticasone furoate-vilanterol (BREO ELLIPTA) 200-25 MCG/INH AEPB Inhale 1 puff into the lungs daily.   01/28/2017 at Unknown time  . gabapentin (NEURONTIN) 100 MG capsule Take 100 mg by mouth 2 (two) times daily.   01/28/2017 at [place]  . HYDROcodone-acetaminophen (NORCO) 7.5-325 MG tablet Take 1 tablet by mouth 3 (three) times daily as needed for moderate pain.   01/27/2017 at Unknown time  . insulin glargine (LANTUS) 100 UNIT/ML injection Inject 0.2 mLs (20 Units total) into the skin daily. 10 mL 11 01/27/2017 at [facility]  . insulin lispro (HUMALOG) 100 UNIT/ML injection Inject 3-20 Units into the skin 3 (three) times daily before meals. 70-100= 0 units 101-150=3 units 151-200=4 units 201-250=8 units 251-300=12units 301-350=16units Greater than 350= 20 units   01/28/2017 at Unknown time  . levothyroxine (SYNTHROID, LEVOTHROID) 137 MCG tablet Take 137 mcg by mouth daily before breakfast.   01/28/2017 at Unknown time  . Magnesium 250  MG TABS Take 2 tablets by mouth 2 (two) times daily.    01/28/2017 at Unknown time  . metoprolol tartrate (LOPRESSOR) 25 MG tablet TAKE 1/2 TABLET BY MOUTH TWICE DAILY 90 tablet 1 01/28/2017 at 800a  . Multiple Vitamin (MULTIVITAMIN WITH MINERALS) TABS tablet Take 1 tablet by mouth daily.   01/28/2017 at Unknown time  . omeprazole (PRILOSEC) 20 MG capsule TAKE ONE CAPSULE BY MOUTH DAILY 90 capsule 3 01/28/2017 at Unknown time  . ondansetron (ZOFRAN) 4 MG tablet Take 1 tablet (4 mg total) by mouth every 6 (six) hours as needed for nausea. 20 tablet 0 01/28/2017 at Unknown time  . potassium chloride SA (K-DUR,KLOR-CON) 20 MEQ tablet Take 1 tablet (20 mEq total) by mouth daily.   01/28/2017 at Unknown time  . torsemide (DEMADEX) 20 MG tablet Take 1 tablet (20 mg total) by mouth 2 (two) times daily. (may take an extra 49m as needed)   01/28/2017 at Unknown time  . triamcinolone (NASACORT ALLERGY 24HR) 55 MCG/ACT AERO  nasal inhaler Place 2 sprays into the nose daily as needed (for allergies).    unknown  . feeding supplement, GLUCERNA SHAKE, (GLUCERNA SHAKE) LIQD Take 237 mLs by mouth 3 (three) times daily between meals.  0   . gabapentin (NEURONTIN) 300 MG capsule Take 1 capsule (300 mg total) by mouth 2 (two) times daily. (Yolanda [name] not taking: Reported on 01/28/2017)   Not Taking at Unknown time  . polyvinyl alcohol (LIQUIFILM TEARS) 1.4 % ophthalmic solution Place 1 drop into both eyes 4 (four) times daily as needed for dry eyes. 15 mL 0 unknown   Scheduled: . albuterol  3 mL Inhalation TID  . colchicine  0.3 mg Oral Daily  . docusate sodium  100 mg Oral BID  . feeding supplement (GLUCERNA SHAKE)  237 mL Oral BID BM  . fluticasone furoate-vilanterol  1 puff Inhalation Daily  . gabapentin  300 mg Oral BID  . triamcinolone cream   Topical BID   And  . hydrocerin   Topical BID  . insulin aspart  0-15 Units Subcutaneous TID WC  . levothyroxine  137 mcg Oral QAC breakfast  . loratadine  10 mg Oral Daily  .  metoprolol tartrate  12.5 mg Oral BID  . pantoprazole  40 mg Oral Daily  . potassium chloride  20 mEq Oral BID  . sodium chloride flush  3 mL Intravenous Q12H  . vancomycin  250 mg Oral Q6H   Continuous:  LPF:[id number] **OR** acetaminophen, HYDROcodone-acetaminophen, ondansetron **OR** ondansetron (ZOFRAN) IV, polyvinyl alcohol, zolpidem  Assesment: Yolanda [name] was admitted with hyponatremia and yesterday her sodium level was essentially normal at 134. Yolanda [name] had acute on chronic kidney failure and that is improved. At baseline Yolanda [name] has COPD which seems to be doing okay now. Yolanda [name] has paroxysmal atrial fib and is chronically anticoagulated but her anticoagulation has been held in anticipation of EGD tomorrow. Yolanda [name] has had solid food dysphagia and Yolanda [name]'s scheduled for EGD tomorrow. Eliquis has been held. Yolanda [name] has sleep apnea but does not use CPAP at home so I don't see any reason to give it to her here. Yolanda [name] has diabetes which is doing okay. Yolanda [name] had mild cellulitis of her left lower extremity and that is improved. Yolanda [name] had diarrhea and was on antibiotics with equivocal C. difficile testing and Yolanda [name] was on vancomycin but the diarrhea has resolved and Yolanda [name] is no longer on antibiotics so I think we can stop that. Yolanda [name] has elevated LFTs which are to be rechecked. Yolanda [name] has gout and says Yolanda [name]'s intolerant to allopurinol. Yolanda [name] says that Yolanda [name] thinks Yolanda [name] got some swelling from allopurinol so it's not a true allergy. Principal Problem:   Hyponatremia Active Problems:   HTN (hypertension)   COPD (chronic obstructive pulmonary disease) (HCC)   Neuropathic pain   Hypothyroidism   PAF (paroxysmal atrial fibrillation) (HCC)   Cellulitis of left lower extremity   Chronic anticoagulation   Sleep apnea   Gout attack   Hyperglycemia   Elevated LFTs   Diabetes mellitus type 2 in obese Yolanda [name])    Plan: Transfer out of stepdown. Nothing by mouth after midnight. Start allopurinol. Continue other treatments. Review blood testing when  available    LOS: 6 days   Fairley Copher L 02/03/2017, 9:18 AM

## 2017-02-04 DIAGNOSIS — R1310 Dysphagia, unspecified: Secondary | ICD-10-CM

## 2017-02-04 DIAGNOSIS — R1319 Other dysphagia: Secondary | ICD-10-CM

## 2017-02-04 LAB — RENAL FUNCTION PANEL
ALBUMIN: 2 g/dL — AB (ref 3.5–5.0)
Anion gap: 5 (ref 5–15)
BUN: 7 mg/dL (ref 6–20)
CALCIUM: 7.2 mg/dL — AB (ref 8.9–10.3)
CHLORIDE: 103 mmol/L (ref 101–111)
CO2: 28 mmol/L (ref 22–32)
CREATININE: 0.78 mg/dL (ref 0.44–1.00)
GFR calc Af Amer: >60 mL/min (ref >60)
Glucose, Bld: 92 mg/dL (ref 65–99)
Phosphorus: 3.4 mg/dL (ref 2.5–4.6)
Potassium: 4.1 mmol/L (ref 3.5–5.1)
SODIUM: 136 mmol/L (ref 135–145)

## 2017-02-04 LAB — GLUCOSE, CAPILLARY
GLUCOSE-CAPILLARY: 97 mg/dL (ref 65–99)
Glucose-Capillary: 120 mg/dL — ABNORMAL HIGH (ref 65–99)
Glucose-Capillary: 147 mg/dL — ABNORMAL HIGH (ref 65–99)
Glucose-Capillary: 149 mg/dL — ABNORMAL HIGH (ref 65–99)

## 2017-02-04 MED ORDER — ORAL CARE MOUTH RINSE
15.0000 mL | Freq: Two times a day (BID) | OROMUCOSAL | Status: DC
  Administered 2017-02-04 – 2017-02-06 (×5): 15 mL via OROMUCOSAL

## 2017-02-04 MED ORDER — FUROSEMIDE 10 MG/ML IJ SOLN
40.0000 mg | Freq: Every day | INTRAMUSCULAR | Status: DC
  Administered 2017-02-04 – 2017-02-06 (×3): 40 mg via INTRAVENOUS
  Filled 2017-02-04 (×3): qty 4
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
## 2017-02-04 NOTE — Progress Notes (Signed)
Patient right and left arm edematous and painful to touch, swelling as increased from this morning. Left arm, raised, looks fluid filled. MD notified, Dr. Juanetta GoslingHawkins  ordered lasix 40 mg.

## 2017-02-04 NOTE — Progress Notes (Signed)
Patient has a order for CPAP QHS. Patient says she would rather just wear her [place] tonight. CPAP at bedside if patient changes her mind. RT will continue to monitor.

## 2017-02-04 NOTE — Progress Notes (Signed)
Subjective: Interval History: Patient says that she has some problem swallowing and is going to be seen by GI. At this moment she denies any dizziness or lightheadedness. She denies also any weakness. Overall she claims she's feeling better.  Objective: Vital signs in last 24 hours: Temp:  [98.4 F (36.9 C)-98.6 F (37 C)] 98.4 F (36.9 C) (05/14 0513) Pulse Rate:  [99-107] 101 (05/14 0513) Resp:  [16-17] 16 (05/14 0513) BP: (107-127)/(52-59) 120/59 (05/14 0513) SpO2:  [94 %-99 %] 94 % (05/14 0748) Weight change:   Intake/Output from previous day: 05/13 0701 - 05/14 0700 In: 990 [P.O.:960; I.V.:30] Out: -  Intake/Output this shift: No intake/output data recorded.  General appearance: alert, cooperative and no distress Resp: diminished breath sounds bilaterally and wheezes bilaterally Cardio: regular rate and rhythm Extremities: extremities normal, atraumatic, no cyanosis or edema  Lab Results:  Recent Labs  02/02/17 0500  WBC 4.9  HGB 7.5*  HCT 22.0*  PLT 276   BMET:   Recent Labs  02/03/17 1208 02/04/17 0544  NA 132* 136  K 4.3 4.1  CL 102 103  CO2 24 28  GLUCOSE 118* 92  BUN 8 7  CREATININE 0.95 0.78  CALCIUM 7.3* 7.2*   No results for input(s): PTH in the last 72 hours. Iron Studies:   Recent Labs  02/01/17 0920 02/02/17 0020  IRON 114  --   TIBC 195*  --   FERRITIN  --  688*    Studies/Results: No results found.  I have reviewed the patient's current medications.  Assessment/Plan: Problem #1 acute kidney injury superimposed on chronic. Remains non-oliguric and asymptomatic.. Etiology was thought to be secondary to prerenal syndrome versus ATN. Renal function has recovered. Problem #2 chronic renal failure: Stage III. Thought to be secondary to diabetes/hypertension/ischemic/recurrent AK I Problem #3 hyponatremia: Thought to be secondary hypovolemic hyponatremia. Patient is offered water restriction. Her sodium is normal today. Problem #4  anemia: Her hemoglobin is low but stable. Her iron saturation and ferritin is high and hence this could be anemia of chronic disease. Problem #5 history of COPD: Denies any difficulty breathing. Problem #6 history of hypothyroidism: On Synthroid Problem #7 diabetes: Her blood sugar is reasonably controlled Problem #8 hypokalemia: Her potassium is normal. She is on potassium supplement. Plan: 1] we will DC potassium supplement 2] since patient electrolyte imbalance and renal function has recovered I will sign off. Thank you for letting me participate in her care.   LOS: 7 days   Min Collymore S 02/04/2017,8:07 AM

## 2017-02-04 NOTE — Progress Notes (Signed)
    Subjective: Tolerating dysphagia 3 diet. No abdominal pain. Does not like the food, stating the [place] was not good. Would like for Dr. Karilyn Cotaehman to complete EGD.   Objective: Vital signs in last 24 hours: Temp:  [98.4 F (36.9 C)-98.6 F (37 C)] 98.4 F (36.9 C) (05/14 0513) Pulse Rate:  [99-107] 101 (05/14 0513) Resp:  [16-17] 16 (05/14 0513) BP: (107-127)/(52-59) 120/59 (05/14 0513) SpO2:  [94 %-99 %] 94 % (05/14 0748) Last BM Date: 02/03/17 General:   Alert and oriented, pleasant Head:  Normocephalic and atraumatic. Eyes:  No icterus, sclera clear. Conjuctiva pink.  Abdomen:  Bowel sounds present, soft, non-tender, non-distended. No HSM or hernias noted. No rebound or guarding. No masses appreciated  Msk:  Symmetrical without gross deformities. Normal posture. Extremities:  With left lower extremity cellulitis, bilateral pedal edema with left greater than right  Neurologic:  Alert and  oriented x4;  grossly normal neurologically. Psych:  Alert and cooperative. Normal mood and affect.  Intake/Output from previous day: 05/13 0701 - 05/14 0700 In: 990 [P.O.:960; I.V.:30] Out: -  Intake/Output this shift: No intake/output data recorded.  Lab Results:  Recent Labs  02/02/17 0500  WBC 4.9  HGB 7.5*  HCT 22.0*  PLT 276   BMET  Recent Labs  02/02/17 0500 02/03/17 1208 02/04/17 0544  NA 133*  134* 132* 136  K 3.0*  3.0* 4.3 4.1  CL 101  101 102 103  CO2 26  26 24 28   GLUCOSE 86  88 118* 92  BUN 13  13 8 7   CREATININE 1.51*  1.51* 0.95 0.78  CALCIUM 7.1*  7.1* 7.3* 7.2*   LFT  Recent Labs  02/02/17 0500 02/03/17 1208 02/04/17 0544  ALBUMIN 1.9* 2.1* 2.0*    Assessment: 76 year old female admitted with weakness and profound hyponatremia, noted to have dysphagia.   Dysphagia: speech has assessed with MBSS and noted mild oral phase dysphagia, barium filled esophagus suggestive of delayed emptying and dysmotility, and barium tablet remaining near  GE junction. Recommend Dysphagia 3/mechanically soft with thin liquids. BPE with possbile narrowing of distal esophagus at GE junction but study not complete due to aspiration. Clinically doing well on dysphagia 3 diet. Desires Dr. Karilyn Cotaehman to complete EGD when he returns. Will resume dysphagia 3 diet and have her NPO after midnight in anticipation of EGD.   Abnormal LFTs: history of fatty liver, unable to exclude underlying advanced liver disease.   Anemia: no overt GI bleeding.   Diarrhea: on admission. Cdiff antigen positive but toxin and Cdiff PCR negative, likely carrier. Diarrhea resolved and antibiotics discontinued.   History of afib with Eliquis on hold in anticipation of EGD/dilation at some point this admission.   Plan: Resume dysphagia 3 diet NPO after midnight for likely EGD/dilation with Dr. Karilyn Cotaehman 5/15   Gelene MinkAnna W. Kingdom Vanzanten, PhD, ANP-BC [facility]    LOS: 7 days    02/04/2017, 9:51 AM

## 2017-02-04 NOTE — Clinical Social Work Note (Signed)
LCS[facility] who advised that patient's authorization needed to be sought again as the previous one had expired because patient did not discharge.    LCS[facility].     Greogry Goodwyn, Juleen ChinaHeather D, LCSW

## 2017-02-05 ENCOUNTER — Encounter (HOSPITAL_COMMUNITY): Payer: Self-pay | Admitting: *Deleted

## 2017-02-05 ENCOUNTER — Encounter (HOSPITAL_COMMUNITY)
Admission: EM | Disposition: A | Discharge status: Skilled Nursing Facility | Payer: Self-pay | Source: Home / Self Care | Attending: Pulmonary Disease

## 2017-02-05 DIAGNOSIS — K317 Polyp of stomach and duodenum: Secondary | ICD-10-CM

## 2017-02-05 DIAGNOSIS — K3189 Other diseases of stomach and duodenum: Secondary | ICD-10-CM

## 2017-02-05 DIAGNOSIS — R1314 Dysphagia, pharyngoesophageal phase: Secondary | ICD-10-CM

## 2017-02-05 DIAGNOSIS — K222 Esophageal obstruction: Secondary | ICD-10-CM

## 2017-02-05 DIAGNOSIS — R933 Abnormal findings on diagnostic imaging of other parts of digestive tract: Secondary | ICD-10-CM

## 2017-02-05 DIAGNOSIS — K449 Diaphragmatic hernia without obstruction or gangrene: Secondary | ICD-10-CM

## 2017-02-05 HISTORY — PX: ESOPHAGOGASTRODUODENOSCOPY: SHX5428

## 2017-02-05 HISTORY — PX: ESOPHAGEAL DILATION: SHX303

## 2017-02-05 LAB — GLUCOSE, CAPILLARY
GLUCOSE-CAPILLARY: 128 mg/dL — AB (ref 65–99)
Glucose-Capillary: 96 mg/dL (ref 65–99)
Glucose-Capillary: 126 mg/dL — ABNORMAL HIGH (ref 65–99)
Glucose-Capillary: 130 mg/dL — ABNORMAL HIGH (ref 65–99)

## 2017-02-05 SURGERY — EGD (ESOPHAGOGASTRODUODENOSCOPY)
Anesthesia: Moderate Sedation

## 2017-02-05 MED ORDER — MEPERIDINE HCL 50 MG/ML IJ SOLN
INTRAMUSCULAR | Status: DC | PRN
  Administered 2017-02-05: 25 mg

## 2017-02-05 MED ORDER — SIMETHICONE 40 MG/0.6ML PO SUSP
ORAL | Status: DC | PRN
  Administered 2017-02-05: 13:00:00

## 2017-02-05 MED ORDER — MEPERIDINE HCL 50 MG/ML IJ SOLN
INTRAMUSCULAR | Status: AC
  Filled 2017-02-05: qty 1

## 2017-02-05 MED ORDER — LIDOCAINE VISCOUS 2 % MT SOLN
OROMUCOSAL | Status: DC | PRN
  Administered 2017-02-05: 3 mL via OROMUCOSAL

## 2017-02-05 MED ORDER — MIDAZOLAM HCL 5 MG/5ML IJ SOLN
INTRAMUSCULAR | Status: DC | PRN
  Administered 2017-02-05 (×2): 1 mg via INTRAVENOUS
  Administered 2017-02-05: 2 mg via INTRAVENOUS
  Administered 2017-02-05: 1 mg via INTRAVENOUS

## 2017-02-05 MED ORDER — SODIUM CHLORIDE 0.9 % IV SOLN
INTRAVENOUS | Status: DC
  Administered 2017-02-05: 12:00:00 via INTRAVENOUS

## 2017-02-05 MED ORDER — LIDOCAINE VISCOUS 2 % MT SOLN
OROMUCOSAL | Status: AC
  Filled 2017-02-05: qty 15

## 2017-02-05 MED ORDER — MIDAZOLAM HCL 5 MG/5ML IJ SOLN
INTRAMUSCULAR | Status: AC
  Filled 2017-02-05: qty 10

## 2017-02-05 NOTE — Progress Notes (Signed)
Subjective: She says she feels okay. No new complaints. She still has some swelling. She is scheduled for EGD and possible dilatation today.  Objective: Vital signs in last 24 hours: Temp:  [98.1 F (36.7 C)-98.6 F (37 C)] 98.6 F (37 C) (05/15 0703) Pulse Rate:  [103-113] 106 (05/15 0703) Resp:  [16-18] 18 (05/15 0703) BP: (105-125)/(45-64) 105/45 (05/15 0703) SpO2:  [94 %-97 %] 95 % (05/15 0823) Weight change:  Last BM Date: 02/04/17  Intake/Output from previous day: 05/14 0701 - 05/15 0700 In: 880 [P.O.:840; I.V.:40] Out: -   PHYSICAL EXAM General appearance: alert, cooperative, no distress and morbidly obese Resp: clear to auscultation bilaterally Cardio: regular rate and rhythm, S1, S2 normal, no murmur, click, rub or gallop GI: soft, non-tender; bowel sounds normal; no masses,  no organomegaly Extremities: She has significant third spacing of fluid Mucous membranes are dry  Lab Results:  Results for orders placed or performed during the hospital encounter of 01/28/17 (from the past 48 hour(s))  Glucose, capillary     Status: Abnormal   Collection Time: 02/03/17 11:16 AM  Result Value Ref Range   Glucose-Capillary 137 (H) 65 - 99 mg/dL  Renal function panel     Status: Abnormal   Collection Time: 02/03/17 12:08 PM  Result Value Ref Range   Sodium 132 (L) 135 - 145 mmol/L   Potassium 4.3 3.5 - 5.1 mmol/L    Comment: DELTA CHECK NOTED   Chloride 102 101 - 111 mmol/L   CO2 24 22 - 32 mmol/L   Glucose, Bld 118 (H) 65 - 99 mg/dL   BUN 8 6 - 20 mg/dL   Creatinine, Ser 0.95 0.44 - 1.00 mg/dL   Calcium 7.3 (L) 8.9 - 10.3 mg/dL   Phosphorus 3.2 2.5 - 4.6 mg/dL   Albumin 2.1 (L) 3.5 - 5.0 g/dL   GFR calc non Af Amer 58 (L) >60 mL/min   GFR calc Af Amer >60 >60 mL/min    Comment: (NOTE) The eGFR has been calculated using the CKD EPI equation. This calculation has not been validated in all clinical situations. eGFR's persistently <60 mL/min signify possible Chronic  Kidney Disease.    Anion gap 6 5 - 15  Glucose, capillary     Status: Abnormal   Collection Time: 02/03/17  4:43 PM  Result Value Ref Range   Glucose-Capillary 118 (H) 65 - 99 mg/dL  Glucose, capillary     Status: Abnormal   Collection Time: 02/03/17  9:22 PM  Result Value Ref Range   Glucose-Capillary 111 (H) 65 - 99 mg/dL  Renal function panel     Status: Abnormal   Collection Time: 02/04/17  5:44 AM  Result Value Ref Range   Sodium 136 135 - 145 mmol/L   Potassium 4.1 3.5 - 5.1 mmol/L   Chloride 103 101 - 111 mmol/L   CO2 28 22 - 32 mmol/L   Glucose, Bld 92 65 - 99 mg/dL   BUN 7 6 - 20 mg/dL   Creatinine, Ser 0.78 0.44 - 1.00 mg/dL   Calcium 7.2 (L) 8.9 - 10.3 mg/dL   Phosphorus 3.4 2.5 - 4.6 mg/dL   Albumin 2.0 (L) 3.5 - 5.0 g/dL   GFR calc non Af Amer >60 >60 mL/min   GFR calc Af Amer >60 >60 mL/min    Comment: (NOTE) The eGFR has been calculated using the CKD EPI equation. This calculation has not been validated in all clinical situations. eGFR's persistently <60 mL/min signify possible  Chronic Kidney Disease.    Anion gap 5 5 - 15  Glucose, capillary     Status: None   Collection Time: 02/04/17  7:50 AM  Result Value Ref Range   Glucose-Capillary 97 65 - 99 mg/dL  Glucose, capillary     Status: Abnormal   Collection Time: 02/04/17 11:19 AM  Result Value Ref Range   Glucose-Capillary 120 (H) 65 - 99 mg/dL  Glucose, capillary     Status: Abnormal   Collection Time: 02/04/17  4:14 PM  Result Value Ref Range   Glucose-Capillary 147 (H) 65 - 99 mg/dL  Glucose, capillary     Status: Abnormal   Collection Time: 02/04/17 10:33 PM  Result Value Ref Range   Glucose-Capillary 149 (H) 65 - 99 mg/dL   Comment 1 Notify RN    Comment 2 Document in Chart   Glucose, capillary     Status: None   Collection Time: 02/05/17  7:46 AM  Result Value Ref Range   Glucose-Capillary 96 65 - 99 mg/dL   Comment 1 Notify RN    Comment 2 Document in Chart     ABGS No results for  input(s): PHART, PO2ART, TCO2, HCO3 in the last 72 hours.  Invalid input(s): PCO2 CULTURES Recent Results (from the past 240 hour(s))  C difficile quick scan w PCR reflex     Status: Abnormal   Collection Time: 01/28/17  4:53 PM  Result Value Ref Range Status   C Diff antigen POSITIVE (A) NEGATIVE Final   C Diff toxin NEGATIVE NEGATIVE Final   C Diff interpretation Results are indeterminate. See PCR results.  Final  Clostridium Difficile by PCR     Status: None   Collection Time: 01/28/17  4:53 PM  Result Value Ref Range Status   Toxigenic C Difficile by pcr NEGATIVE NEGATIVE Final    Comment: Patient is colonized with non toxigenic C. difficile. May not need treatment unless significant symptoms are present. Performed at [facility], [place] [address] [place], [place] [address]   MRSA PCR Screening     Status: None   Collection Time: 01/28/17 11:24 PM  Result Value Ref Range Status   MRSA by PCR NEGATIVE NEGATIVE Final    Comment:        The GeneXpert MRSA Assay (FDA approved for NASAL specimens only), is one component of a comprehensive MRSA colonization surveillance program. It is not intended to diagnose MRSA infection nor to guide or monitor treatment for MRSA infections.   Gastrointestinal Panel by PCR , Stool     Status: None   Collection Time: 01/29/17  3:15 PM  Result Value Ref Range Status   Campylobacter species NOT DETECTED NOT DETECTED Final   Plesimonas shigelloides NOT DETECTED NOT DETECTED Final   Salmonella species NOT DETECTED NOT DETECTED Final   Yersinia enterocolitica NOT DETECTED NOT DETECTED Final   Vibrio species NOT DETECTED NOT DETECTED Final   Vibrio cholerae NOT DETECTED NOT DETECTED Final   Enteroaggregative E coli (EAEC) NOT DETECTED NOT DETECTED Final   Enteropathogenic E coli (EPEC) NOT DETECTED NOT DETECTED Final   Enterotoxigenic E coli (ETEC) NOT DETECTED NOT DETECTED Final   Shiga like toxin producing E coli (STEC) NOT DETECTED NOT  DETECTED Final   Shigella/Enteroinvasive E coli (EIEC) NOT DETECTED NOT DETECTED Final   Cryptosporidium NOT DETECTED NOT DETECTED Final   Cyclospora cayetanensis NOT DETECTED NOT DETECTED Final   Entamoeba histolytica NOT DETECTED NOT DETECTED Final   Giardia  lamblia NOT DETECTED NOT DETECTED Final   Adenovirus F40/41 NOT DETECTED NOT DETECTED Final   Astrovirus NOT DETECTED NOT DETECTED Final   Norovirus GI/GII NOT DETECTED NOT DETECTED Final   Rotavirus A NOT DETECTED NOT DETECTED Final   Sapovirus (I, II, IV, and V) NOT DETECTED NOT DETECTED Final   Studies/Results: No results found.  Medications:  Prior to Admission:  Prescriptions Prior to Admission  Medication Sig Dispense Refill Last Dose  . acetaminophen (TYLENOL) 325 MG tablet Take 2 tablets (650 mg total) by mouth every 6 (six) hours as needed for mild pain (or Fever >/= 101).   01/28/2017 at Unknown time  . albuterol (PROVENTIL HFA;VENTOLIN HFA) 108 (90 Base) MCG/ACT inhaler Inhale 2 puffs into the lungs 4 (four) times daily.    01/28/2017 at Unknown time  . apixaban (ELIQUIS) 5 MG TABS tablet Take 1 tablet (5 mg total) by mouth 2 (two) times daily. 60 tablet 5 01/28/2017 at [place]  . calcium carbonate (TUMS - DOSED IN MG ELEMENTAL CALCIUM) 500 MG chewable tablet Chew 1 tablet by mouth 2 (two) times daily.   01/28/2017 at Unknown time  . cetirizine (ZYRTEC) 10 MG tablet Take 10 mg by mouth daily.   01/28/2017 at Unknown time  . cholecalciferol (VITAMIN D) 1000 units tablet Take 1,000 Units by mouth every morning.   01/28/2017 at Unknown time  . cholecalciferol (VITAMIN D) 400 units TABS tablet Take 400 Units by mouth daily.   01/28/2017 at Unknown time  . colchicine 0.6 MG tablet Take 0.6 mg by mouth 2 (two) times daily.   01/28/2017 at Unknown time  . diltiazem (CARDIZEM CD) 120 MG 24 hr capsule Take 1 capsule (120 mg total) by mouth daily. 90 capsule 3 01/28/2017 at Unknown time  . fluticasone furoate-vilanterol (BREO ELLIPTA) 200-25 MCG/INH  AEPB Inhale 1 puff into the lungs daily.   01/28/2017 at Unknown time  . gabapentin (NEURONTIN) 100 MG capsule Take 100 mg by mouth 2 (two) times daily.   01/28/2017 at [place]  . HYDROcodone-acetaminophen (NORCO) 7.5-325 MG tablet Take 1 tablet by mouth 3 (three) times daily as needed for moderate pain.   01/27/2017 at Unknown time  . insulin glargine (LANTUS) 100 UNIT/ML injection Inject 0.2 mLs (20 Units total) into the skin daily. 10 mL 11 01/27/2017 at 2000  . insulin lispro (HUMALOG) 100 UNIT/ML injection Inject 3-20 Units into the skin 3 (three) times daily before meals. 70-100= 0 units 101-150=3 units 151-200=4 units 201-250=8 units 251-300=12units 301-350=16units Greater than 350= 20 units   01/28/2017 at Unknown time  . levothyroxine (SYNTHROID, LEVOTHROID) 137 MCG tablet Take 137 mcg by mouth daily before breakfast.   01/28/2017 at Unknown time  . Magnesium 250 MG TABS Take 2 tablets by mouth 2 (two) times daily.    01/28/2017 at Unknown time  . metoprolol tartrate (LOPRESSOR) 25 MG tablet TAKE 1/2 TABLET BY MOUTH TWICE DAILY 90 tablet 1 01/28/2017 at 800a  . Multiple Vitamin (MULTIVITAMIN WITH MINERALS) TABS tablet Take 1 tablet by mouth daily.   01/28/2017 at Unknown time  . omeprazole (PRILOSEC) 20 MG capsule TAKE ONE CAPSULE BY MOUTH DAILY 90 capsule 3 01/28/2017 at Unknown time  . ondansetron (ZOFRAN) 4 MG tablet Take 1 tablet (4 mg total) by mouth every 6 (six) hours as needed for nausea. 20 tablet 0 01/28/2017 at Unknown time  . potassium chloride SA (K-DUR,KLOR-CON) 20 MEQ tablet Take 1 tablet (20 mEq total) by mouth daily.   01/28/2017 at Unknown  time  . torsemide (DEMADEX) 20 MG tablet Take 1 tablet (20 mg total) by mouth 2 (two) times daily. (may take an extra 34m as needed)   01/28/2017 at Unknown time  . triamcinolone (NASACORT ALLERGY 24HR) 55 MCG/ACT AERO nasal inhaler Place 2 sprays into the nose daily as needed (for allergies).    unknown  . feeding supplement, GLUCERNA SHAKE, (GLUCERNA SHAKE)  LIQD Take 237 mLs by mouth 3 (three) times daily between meals.  0   . gabapentin (NEURONTIN) 300 MG capsule Take 1 capsule (300 mg total) by mouth 2 (two) times daily. (Patient not taking: Reported on 01/28/2017)   Not Taking at Unknown time  . polyvinyl alcohol (LIQUIFILM TEARS) 1.4 % ophthalmic solution Place 1 drop into both eyes 4 (four) times daily as needed for dry eyes. 15 mL 0 unknown   Scheduled: . albuterol  3 mL Inhalation TID  . allopurinol  150 mg Oral Daily  . colchicine  0.3 mg Oral Daily  . docusate sodium  100 mg Oral BID  . feeding supplement (GLUCERNA SHAKE)  237 mL Oral BID BM  . fluticasone furoate-vilanterol  1 puff Inhalation Daily  . furosemide  40 mg Intravenous Daily  . gabapentin  300 mg Oral BID  . triamcinolone cream   Topical BID   And  . hydrocerin   Topical BID  . insulin aspart  0-15 Units Subcutaneous TID WC  . levothyroxine  137 mcg Oral QAC breakfast  . loratadine  10 mg Oral Daily  . mouth rinse  15 mL Mouth Rinse BID  . metoprolol tartrate  12.5 mg Oral BID  . pantoprazole  40 mg Oral Daily  . sodium chloride flush  3 mL Intravenous Q12H   Continuous:  PASN:[id number]**OR** acetaminophen, HYDROcodone-acetaminophen, ondansetron **OR** ondansetron (ZOFRAN) IV, polyvinyl alcohol, zolpidem  Assesment: She was admitted with severe hyponatremia. She had acute kidney injury and she is back to baseline as far as her hyponatremia and her renal function is concerned. She has a history of heart failure and she still has a lot of swelling of the extremities so I've reinstituted Lasix as of yesterday. She has paroxysmal atrial fibrillation and has been chronically anticoagulated but that has been held for her to have EGD. She has diabetes which is pretty well controlled. She has gout with elevated uric acid level and she's tolerating allopurinol so far. She has elevated LFTs and that will be followed. She has dysphagia and she's going to have EGD today. She has  COPD at baseline and that seems stable now. Principal Problem:   Hyponatremia Active Problems:   HTN (hypertension)   COPD (chronic obstructive pulmonary disease) (HCC)   Neuropathic pain   Hypothyroidism   PAF (paroxysmal atrial fibrillation) (HCC)   Cellulitis of left lower extremity   Chronic anticoagulation   Sleep apnea   Gout attack   Hyperglycemia   Elevated LFTs   Diabetes mellitus type 2 in obese ([facility]   Esophageal dysphagia    Plan: EGD today. She will need to go back to skilled care facility when she's discharged and she is approaching discharge now    LOS: 8 days   Aimar Shrewsbury L 02/05/2017, 8:50 AM

## 2017-02-05 NOTE — Progress Notes (Signed)
Occupational Therapy Treatment Patient Details Name: Yolanda MurrainBrenda W [name] MRN: [id number] DOB: 22-Dec-1940 Today's Date: 02/05/2017    History of present illness 76 y.o. female with medical history significant of Afib on Eliquis, Stage 3 CKD, hypothyroidism, GERD, HTN, diastolic heart failure, and COPD. Dx: Hyponatremia, and L LE cellulitis.    OT comments  Pt received in bed, agreeable to OT treatment this am. Pt requiring assistance for bed mobility and bed<>chair transfer with RW, verbal cuing for taking deep breaths and rest breaks provided as needed for fatigue. Pt able to perform grooming tasks with set-up while seated in chair, no coordination difficulties noted this am. Discharge plan remains appropriate at this time.    Follow Up Recommendations  SNF    Equipment Recommendations  None recommended by OT    Recommendations for Other Services      Precautions / Restrictions Precautions Precautions: Fall Precaution Comments: 3 falls - at church, down the steps, and then just prior to admission - fell off a stool in the bathroom.  Restrictions Weight Bearing Restrictions: No       Mobility Bed Mobility Overal bed mobility: Needs Assistance Bed Mobility: Supine to Sit     Supine to sit: Min assist;HOB elevated     General bed mobility comments: verbal cuing to push up from elbow and hand   Transfers Overall transfer level: Needs assistance Equipment used: Rolling walker (2 wheeled) Transfers: Sit to/from [employer] CorporationStand;Stand Pivot Transfers Sit to Stand: Min assist;+2 physical assistance;+2 safety/equipment Stand pivot transfers: Min assist;Mod assist (verbal cuing for breathing and to reach back for chair)                ADL either performed or assessed with clinical judgement   ADL Overall ADL's : Needs assistance/impaired     Grooming: Wash/dry face;Oral care;Brushing hair;Set up;Sitting               Lower Body Dressing: Total assistance;Bed level Lower Body  Dressing Details (indicate cue type and reason): donning hospital socks.               General ADL Comments: pt brushed teeth with right hand, holding bucket with left. No coordination difficulties noted               Cognition Arousal/Alertness: Awake/alert Behavior During Therapy: WFL for tasks assessed/performed Overall Cognitive Status: Within Functional Limits for tasks assessed                                 General Comments: Pt anxious during session, reminders for taking deep breaths              General Comments      Pertinent Vitals/ Pain       Pain Assessment: 0-10 Pain Score: 4  Pain Location: bilateral feet Pain Descriptors / Indicators: Burning Pain Intervention(s): Limited activity within patient's tolerance;Monitored during session;Repositioned         Frequency  Min 2X/week        Progress Toward Goals  OT Goals(current goals can now be found in the care plan section)  Progress towards OT goals: Progressing toward goals  Acute Rehab OT Goals Patient Stated Goal: To get stronger OT Goal Formulation: With patient Time For Goal Achievement: 02/13/17 Potential to Achieve Goals: Good ADL Goals Pt Will Perform Grooming: with set-up;sitting Pt Will Perform Upper Body Bathing: with set-up;sitting Pt Will Transfer to Toilet: with min  assist;ambulating;bedside commode Pt Will Perform Toileting - Clothing Manipulation and hygiene: with min assist;sit to/from stand Pt/caregiver will Perform Home Exercise Program: Increased strength;Both right and left upper extremity;With written HEP provided;With Supervision  Plan Discharge plan remains appropriate;Frequency remains appropriate       AM-PAC PT "6 Clicks" Daily Activity     Outcome Measure   Help from another person eating meals?: None Help from another person taking care of personal grooming?: A Little Help from another person toileting, which includes using toliet, bedpan, or  urinal?: Total Help from another person bathing (including washing, rinsing, drying)?: A Lot Help from another person to put on and taking off regular upper body clothing?: A Lot Help from another person to put on and taking off regular lower body clothing?: Total 6 Click Score: 13    End of Session Equipment Utilized During Treatment: Gait belt;Rolling walker  OT Visit Diagnosis: Muscle weakness (generalized) (M62.81)   Activity Tolerance Patient tolerated treatment well   Patient Left in chair;with call bell/phone within reach   Nurse Communication          Time: 9518-8416 OT Time Calculation (min): 25 min  Charges: OT General Charges $OT Visit: 1 Procedure OT Treatments $Self Care/Home Management : 23-37 mins   Ezra Sites, OTR/L  [phone] 02/05/2017, 9:07 AM

## 2017-02-05 NOTE — Clinical Social Work Note (Signed)
LCSW returned a voicemail to Berlinda Lastharles Simpson, [facility], at [phone]. Message was left requesting return contact. Voicemail from Mr. Lodema HongSimpson stated that he wanted to confirm that functional status had not changed since no new PT notes were available and that case would need to be reviewed by medical director.     Romolo Sieling, Juleen ChinaHeather D, LCSW

## 2017-02-05 NOTE — Progress Notes (Signed)
EGD findings; Normal esophageal mucosa. Schatzki's ring disrupted by passing 54 [place] dilator. Small sliding hiatal hernia. 2 small polyps in gastric fundus. Erosive antral gastritis with deformed but patent pylorus.

## 2017-02-05 NOTE — Op Note (Signed)
[facility] Patient Name: Yolanda MaresBrenda [name] Procedure Date: 02/05/2017 12:28 PM MRN: [id number] Date of Birth: 06-Apr-1941 Attending MD: Lionel DecemberNajeeb Rehman , MD CSN: [id number] Age: [age] Admit Type: Inpatient Procedure:                Upper GI endoscopy Indications:              Esophageal dysphagia, Abnormal abdominal x-ray of                            the GI tract Providers:                Lionel DecemberNajeeb Rehman, MD, Loma MessingLurae B. Patsy LagerAlbert RN, RN, Dyann Ruddleonya                            Wilson Referring MD:             Oneal DeputyEdward L. Juanetta GoslingHawkins MD, MD Medicines:                Lidocaine spray, Meperidine 25 mg IV, Midazolam 5                            mg IV Complications:            No immediate complications. Estimated Blood Loss:     Estimated blood loss was minimal. Procedure:                Pre-Anesthesia Assessment:                           - Prior to the procedure, a History and Physical                            was performed, and patient medications and                            allergies were reviewed. The patient's tolerance of                            previous anesthesia was also reviewed. The risks                            and benefits of the procedure and the sedation                            options and risks were discussed with the patient.                            All questions were answered, and informed consent                            was obtained. Prior Anticoagulants: The patient                            last took Eliquis (apixaban) 7 days prior to the  procedure. ASA Grade Assessment: III - A patient                            with severe systemic disease. After reviewing the                            risks and benefits, the patient was deemed in                            satisfactory condition to undergo the procedure.                           After obtaining informed consent, the endoscope was                            passed under direct vision.  Throughout the                            procedure, the patient's blood pressure, pulse, and                            oxygen saturations were monitored continuously. The                            EG-[id number] ([id number]) scope was introduced through the                            mouth, and advanced to the second part of duodenum.                            The upper GI endoscopy was accomplished without                            difficulty. The patient tolerated the procedure                            well. Scope In: 1:18:32 PM Scope Out: 1:28:23 PM Total Procedure Duration: 0 hours 9 minutes 51 seconds  Findings:      The examined esophagus was normal.      A non-obstructing Schatzki ring (acquired) was found at the       gastroesophageal junction. The dilation site was examined following       endoscope reinsertion and showed moderate improvement in luminal       narrowing. The scope was withdrawn. Dilation was performed with a       Maloney dilator with no resistance at 54 Fr. The dilation site was       examined and showed mucosal disruption.. Ring noted to have been       disrupted.      A 2 cm hiatal hernia was present.      Two small sessile polyps were found in the gastric fundus.      A few erosions were found in the prepyloric region of the stomach.       prepyloric scarring with deformed pylorus.      The duodenal bulb  and second portion of the duodenum were normal. Impression:               - Normal esophagus.                           - Non-obstructing Schatzki ring.Ddilated.                           - 2 cm hiatal hernia.                           - Two gastric polyps. these were left alone.                           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                           - No  specimens collected. Moderate Sedation:      Moderate (conscious) sedation was administered by the endoscopy nurse       and supervised by the endoscopist. The following parameters were       monitored: oxygen saturation, heart rate, blood pressure, CO2       capnography and response to care. Total physician intraservice time was       16 minutes. Recommendation:           - Return patient to hospital ward for ongoing care.                           - Diabetic (ADA) diet today.                           - Continue present medications.                           - H. pylori serology. Procedure Code(s):        --- Professional ---                           [phone], Esophagogastroduodenoscopy, flexible,                            transoral; diagnostic, including collection of                            specimen(s) by brushing or washing, when performed                            (separate procedure)  40981, Dilation of esophagus, by unguided sound or                            bougie, single or multiple passes                           99152, Moderate sedation services provided by the                            same physician or other qualified health care                            professional performing the diagnostic or                            therapeutic service that the sedation supports,                            requiring the presence of an independent trained                            observer to assist in the monitoring of the                            patient's level of consciousness and physiological                            status; initial 15 minutes of intraservice time,                            patient age 23 years or older Diagnosis Code(s):        --- Professional ---                           K22.2, Esophageal obstruction                           K44.9, Diaphragmatic hernia without obstruction or                            gangrene                            K31.7, Polyp of stomach and duodenum                           K31.89, Other diseases of stomach and duodenum                           R13.14, Dysphagia, pharyngoesophageal phase                           R93.3, Abnormal findings on diagnostic imaging of                            other parts  of digestive tract CPT copyright 2016 [employer]. All rights reserved. The codes documented in this report are preliminary and upon coder review may  be revised to meet current compliance requirements. Lionel December, MD Lionel December, MD 02/05/2017 1:46:44 PM This report has been signed electronically. Number of Addenda: 0

## 2017-02-05 NOTE — Progress Notes (Signed)
PT Cancellation Note  Patient Details Name: Yolanda [name] MRN: [id number] DOB: [birth year]   Cancelled Treatment:    Reason Eval/Treat Not Completed: Other (comment) (Pt just returned from EGD, and expressed that she was very hungry from not getting to eat for 2 days, and a tray was coming up for her.  Pt request to hold PT today, and resume tomorrow.  )   [name], PT, DPT X: [phone]

## 2017-02-05 NOTE — Care Management Note (Signed)
Case Management Note  Patient Details  Name: Yolanda [name] MRN: [id number] Date of Birth: May 23, 1941   If discussed at Long Length of Stay Meetings, dates discussed:  02/05/2017  Additional Comments:  Yolanda [name], Yolanda OilerSharley Diane, RN 02/05/2017, 4:01 PM

## 2017-02-05 NOTE — Clinical Social Work Note (Signed)
LCSW [facility] [place] [facility]. Authorization is approved 02/06/17, next review date is 02/08/17. Auth ID is [id number]. Patient will need 536 minutes of therapy weekly.  Patient's rug level is RVB.     Sandeep Radell, Juleen ChinaHeather D, LCSW

## 2017-02-06 ENCOUNTER — Encounter (HOSPITAL_COMMUNITY): Payer: Self-pay | Admitting: Internal Medicine

## 2017-02-06 ENCOUNTER — Inpatient Hospital Stay
Admission: RE | Admit: 2017-02-06 | Discharge: 2017-02-10 | Disposition: A | Discharge status: Other Acute Inpatient Hospital | Payer: Medicare Other | Source: Ambulatory Visit | Attending: Pulmonary Disease | Admitting: Pulmonary Disease

## 2017-02-06 DIAGNOSIS — K2970 Gastritis, unspecified, without bleeding: Secondary | ICD-10-CM | POA: Clinically undetermined

## 2017-02-06 DIAGNOSIS — K222 Esophageal obstruction: Secondary | ICD-10-CM

## 2017-02-06 LAB — HEMOGLOBIN AND HEMATOCRIT, BLOOD
HCT: 23.1 % — ABNORMAL LOW (ref 36.0–46.0)
HEMOGLOBIN: 7.6 g/dL — AB (ref 12.0–15.0)

## 2017-02-06 LAB — GLUCOSE, CAPILLARY: GLUCOSE-CAPILLARY: 99 mg/dL (ref 65–99)

## 2017-02-06 LAB — H. PYLORI ANTIBODY, IGG: H Pylori IgG: <0.8 Index Value (ref 0.00–0.79)

## 2017-02-06 MED ORDER — SODIUM CHLORIDE 0.9 % IV SOLN
510.0000 mg | Freq: Once | INTRAVENOUS | Status: AC
  Administered 2017-02-06: 510 mg via INTRAVENOUS
  Filled 2017-02-06: qty 17

## 2017-02-06 MED ORDER — ALPRAZOLAM 0.25 MG PO TABS: 0.2500 mg | ORAL_TABLET | Freq: Two times a day (BID) | ORAL | 0 refills | Status: DC | PRN

## 2017-02-06 MED ORDER — ALLOPURINOL 300 MG PO TABS: 150.0000 mg | ORAL_TABLET | Freq: Every day | ORAL | Status: DC

## 2017-02-06 MED ORDER — COLCHICINE 0.6 MG PO TABS: 0.6000 mg | ORAL_TABLET | Freq: Two times a day (BID) | ORAL | Status: DC

## 2017-02-06 MED ORDER — DOXEPIN HCL 6 MG PO TABS: 6.0000 mg | ORAL_TABLET | Freq: Every evening | ORAL | Status: DC | PRN

## 2017-02-06 NOTE — Care Management Note (Signed)
Case Management Note  Patient Details  Name: Yolanda [name] MRN: [id number] Date of Birth: 1941-08-10  :   Expected Discharge Date:  02/06/17               Expected Discharge Plan:  Skilled Nursing Facility  In-House Referral:  Clinical Social Work  Discharge planning Services  CM Consult  Post Acute Care Choice:  NA Choice offered to:  NA  DME Arranged:    DME Agency:     HH Arranged:    HH Agency:     Status of Service:  Completed, signed off  If discussed at [employer] [facility] [employer] Meetings, dates discussed:    Additional Comments: [facility] No CM needs.  Murray Guzzetta, Chrystine OilerSharley Diane, RN 02/06/2017, 9:59 AM

## 2017-02-06 NOTE — Discharge Summary (Addendum)
Physician Discharge Summary  Patient ID: Yolanda [name] MRN: [id number] DOB/AGE: 12-31-1940 76 y.o. Primary Care Physician:Harlow Carrizales, Ramon Dredge, MD Admit date: 01/28/2017 Discharge date: 02/06/2017    Discharge Diagnoses:   Principal Problem:   Hyponatremia Active Problems:   GERD (gastroesophageal reflux disease)   HTN (hypertension)   COPD (chronic obstructive pulmonary disease) (HCC)   Neuropathic pain   Hypothyroidism   PAF (paroxysmal atrial fibrillation) (HCC)   Cellulitis of left lower extremity   AKI (acute kidney injury) (HCC)   Acute on chronic diastolic heart failure (HCC)   Chronic anticoagulation   Obesity   Sleep apnea   Gout attack   Hyperglycemia   Elevated LFTs   Diabetes mellitus type 2 in obese [facility])   Esophageal dysphagia   Schatzki's ring   Gastritis Hiatal hernia Acute on chronic iron deficiency anemia Allergies as of 02/06/2017      Reactions   Ciprofloxacin Shortness Of Breath   REACTION: sob,tachycardia   Doxycycline Nausea Only   Also experienced diarrhea    Fish Oil    Patient face drew to the side,Bells Palsey   Guaifenesin Shortness Of Breath   REACTION: sob,tachycardia   Mucinex [guaifenesin Er] Shortness Of Breath   Sulfamethoxazole W/trimethoprim 800-160 [sulfamethoxazole-trimethoprim] Nausea Only   Also lack of appetite    Uloric [febuxostat] Swelling   No urination   Adhesive [tape] Other (See Comments)   Causes blisters on skin   Allopurinol Other (See Comments)   Couldn't urinate   Cefuroxime Axetil Swelling   Swelling all over body-per patient she was hospitalized as a result of taking this medication   Metolazone Nausea Only   Swelling    Tramadol    insomnia      Medication List    STOP taking these medications   Magnesium 250 MG Tabs     TAKE these medications   acetaminophen 325 MG tablet Commonly known as:  TYLENOL Take 2 tablets (650 mg total) by mouth every 6 (six) hours as needed for mild pain (or Fever >/=  101).   albuterol 108 (90 Base) MCG/ACT inhaler Commonly known as:  PROVENTIL HFA;VENTOLIN HFA Inhale 2 puffs into the lungs 4 (four) times daily.   allopurinol 300 MG tablet Commonly known as:  ZYLOPRIM Take 0.5 tablets (150 mg total) by mouth daily. Start taking on:  02/07/2017   ALPRAZolam 0.25 MG tablet Commonly known as:  XANAX Take 1 tablet (0.25 mg total) by mouth 2 (two) times daily as needed for anxiety.   apixaban 5 MG Tabs tablet Commonly known as:  ELIQUIS Take 1 tablet (5 mg total) by mouth 2 (two) times daily.   BREO ELLIPTA 200-25 MCG/INH Aepb Generic drug:  fluticasone furoate-vilanterol Inhale 1 puff into the lungs daily.   calcium carbonate 500 MG chewable tablet Commonly known as:  TUMS - dosed in mg elemental calcium Chew 1 tablet by mouth 2 (two) times daily.   cetirizine 10 MG tablet Commonly known as:  ZYRTEC Take 10 mg by mouth daily.   cholecalciferol 1000 units tablet Commonly known as:  VITAMIN D Take 1,000 Units by mouth every morning. What changed:  Another medication with the same name was removed. Continue taking this medication, and follow the directions you see here.   colchicine 0.6 MG tablet Take 1 tablet (0.6 mg total) by mouth 2 (two) times daily.   diltiazem 120 MG 24 hr capsule Commonly known as:  CARDIZEM CD Take 1 capsule (120 mg total) by mouth daily.  Doxepin HCl 6 MG Tabs Commonly known as:  SILENOR Take 1 tablet (6 mg total) by mouth at bedtime as needed.   feeding supplement (GLUCERNA SHAKE) Liqd Take 237 mLs by mouth 3 (three) times daily between meals.   gabapentin 100 MG capsule Commonly known as:  NEURONTIN Take 100 mg by mouth 2 (two) times daily. What changed:  Another medication with the same name was removed. Continue taking this medication, and follow the directions you see here.   HYDROcodone-acetaminophen 7.5-325 MG tablet Commonly known as:  NORCO Take 1 tablet by mouth 3 (three) times daily as needed  for moderate pain.   insulin glargine 100 UNIT/ML injection Commonly known as:  LANTUS Inject 0.2 mLs (20 Units total) into the skin daily.   insulin lispro 100 UNIT/ML injection Commonly known as:  HUMALOG Inject 3-20 Units into the skin 3 (three) times daily before meals. 70-100= 0 units 101-150=3 units 151-200=4 units 201-250=8 units 251-300=12units 301-350=16units Greater than 350= 20 units   levothyroxine 137 MCG tablet Commonly known as:  SYNTHROID, LEVOTHROID Take 137 mcg by mouth daily before breakfast.   metoprolol tartrate 25 MG tablet Commonly known as:  LOPRESSOR TAKE 1/2 TABLET BY MOUTH TWICE DAILY   multivitamin with minerals Tabs tablet Take 1 tablet by mouth daily.   NASACORT ALLERGY 24HR 55 MCG/ACT Aero nasal inhaler Generic drug:  triamcinolone Place 2 sprays into the nose daily as needed (for allergies).   omeprazole 20 MG capsule Commonly known as:  PRILOSEC TAKE ONE CAPSULE BY MOUTH DAILY   ondansetron 4 MG tablet Commonly known as:  ZOFRAN Take 1 tablet (4 mg total) by mouth every 6 (six) hours as needed for nausea.   polyvinyl alcohol 1.4 % ophthalmic solution Commonly known as:  LIQUIFILM TEARS Place 1 drop into both eyes 4 (four) times daily as needed for dry eyes.   potassium chloride SA 20 MEQ tablet Commonly known as:  K-DUR,KLOR-CON Take 1 tablet (20 mEq total) by mouth daily.   torsemide 20 MG tablet Commonly known as:  DEMADEX Take 1 tablet (20 mg total) by mouth 2 (two) times daily. (may take an extra 20mg  as needed)       Discharged Condition:Improved    Consults: Nephrology/gastroenterology  Significant Diagnostic Studies: Dg Chest 1 View  Result Date: 01/28/2017 CLINICAL DATA:  Acute onset of shortness of breath. Initial encounter. EXAM: CHEST 1 VIEW COMPARISON:  Chest radiograph performed 12/30/2016 FINDINGS: The lungs are well-aerated. Minimal bibasilar atelectasis is noted. There is no evidence of pleural effusion or  pneumothorax. The cardiomediastinal silhouette is mildly enlarged. No acute osseous abnormalities are seen. IMPRESSION: Minimal bibasilar atelectasis noted.  Lungs otherwise clear. Electronically Signed   By: Roanna Raider M.D.   On: 01/28/2017 19:36   Ct Head Wo Contrast  Result Date: 01/28/2017 CLINICAL DATA:  Weakness.  Nausea, loose stools and diarrhea. EXAM: CT HEAD WITHOUT CONTRAST TECHNIQUE: Contiguous axial images were obtained from the base of the skull through the vertex without intravenous contrast. COMPARISON:  Head CT 12/31/2016 FINDINGS: Brain: No evidence of acute infarction, hemorrhage, hydrocephalus, extra-axial collection or mass lesion/mass effect. Mild chronic small vessel ischemia is stable from prior exam. Vascular: Atherosclerosis of skullbase vasculature without hyperdense vessel or abnormal calcification. Skull: Normal. Negative for fracture or focal lesion. Sinuses/Orbits: Paranasal sinuses and mastoid air cells are clear. The visualized orbits are unchanged with mild bilateral exophthalmos. Other: None. IMPRESSION: No acute intracranial abnormality. Electronically Signed   By: Lujean Rave.D.  On: 01/28/2017 19:51   Ct Abdomen Pelvis W Contrast  Result Date: 01/28/2017 CLINICAL DATA:  Shortness of breath, weakness, nausea and diarrhea EXAM: CT ABDOMEN AND PELVIS WITH CONTRAST TECHNIQUE: Multidetector CT imaging of the abdomen and pelvis was performed using the standard protocol following bolus administration of intravenous contrast. CONTRAST:  75mL ISOVUE-300 IOPAMIDOL (ISOVUE-300) INJECTION 61% COMPARISON:  02/02/2015 FINDINGS: Lower chest: Mild cardiomegaly. No pericardial or pleural effusion. Bibasilar atelectasis. Atherosclerosis of the descending thoracic aorta. Hepatobiliary: Mild hypoattenuation of the liver parenchyma compatible with hepatic steatosis or fatty infiltration. No focal hepatic abnormality or biliary dilatation. Patent hepatic and portal veins. Remote  cholecystectomy noted. Pancreas: Unremarkable. No pancreatic ductal dilatation or surrounding inflammatory changes. Spleen: Normal in size without focal abnormality. Adrenals/Urinary Tract: Adrenal glands are unremarkable. Kidneys are normal, without renal calculi, focal lesion, or hydronephrosis. Bladder is unremarkable. Stomach/Bowel: Negative for bowel obstruction, significant dilatation, ileus, or free air. No fluid collection or abscess. Remote appendectomy noted. Colon is collapsed. Minor scattered diverticulosis. Vascular/Lymphatic: Aortic atherosclerosis noted without aneurysm or dissection. No occlusive process. No retroperitoneal hemorrhage. No adenopathy. Reproductive: Uterus and bilateral adnexa are unremarkable. Other: No abdominal wall hernia or abnormality. No abdominopelvic ascites. Musculoskeletal: Diffuse lumbar spondylosis and facet arthropathy. No acute osseous finding or compression fracture. IMPRESSION: No acute intra-abdominal or pelvic finding by CT. Remote cholecystectomy and appendectomy. Hepatic steatosis Abdominal atherosclerosis Diverticulosis. Electronically Signed   By: Judie Petit.  Shick M.D.   On: 01/28/2017 19:57   Dg Esophagus  Result Date: 01/31/2017 CLINICAL DATA:  Dysphagia EXAM: ESOPHOGRAM / BARIUM SWALLOW / BARIUM TABLET STUDY TECHNIQUE: Combined double contrast and single contrast examination performed using effervescent crystals, thick barium liquid, and thin barium liquid. The patient was observed with fluoroscopy swallowing a 13 mm barium sulphate tablet. Study performed with head of bed elevated 30-45 degrees during the exam. Due to aspiration, procedure was terminated prematurely. FLUOROSCOPY TIME:  Fluoroscopy Time:  2 minutes 42 seconds Radiation Exposure Index (if provided by the fluoroscopic device): 38.2 mGy Number of Acquired Spot Images: multiple fluoroscopic screen captures COMPARISON:  None FINDINGS: Esophageal distention: Question mall narrowing at the  gastroesophageal junction. Remainder of esophagus appears to distend normally. Filling defects:  None 12.5 mm barium tablet: Did not pass beyond the distal esophagus and the stomach, though uncertain if this is due to obstruction or severe dysmotility. Multiple swallows of water or unable to pass the pill into the stomach. Motility:  Diffusely impaired Mucosa: On limited assessment walls appeared smooth without irregularity or ulceration. Hypopharynx/cervical esophagus: Dedicated AP and lateral views of the hypopharynx and cervical esophagus during swallowing were not obtained due to early termination of the procedure. Gross laryngeal penetration and aspiration of contrast were identified during the initial swallows of thick barium. Contrast was seen traveling down the RIGHT lateral wall of the trachea into the RIGHT mainstem bronchus with the patient elevated to 45 degrees. No spontaneous cough. Hiatal hernia:  Not identified GE reflux:  Not identified during the period of imaging. Other:  N/A IMPRESSION: Gross laryngeal penetration and aspiration of contrast with barium extending down the RIGHT lateral wall of the trachea into the RIGHT mainstem bronchus without identification of a spontaneous cough reflex. This necessitated early termination of the procedure. 12.5 mm diameter barium tablet passed to the distal esophagus but did not pass into the stomach, potentially due to narrowing of the distal esophagus at the gastroesophageal junction though cannot completely exclude a component of esophageal dysmotility. Electronically Signed   By: Loraine Leriche  Tyron Russell M.D.   On: 01/31/2017 15:38   Dg Swallowing Func-speech Pathology  Result Date: 02/01/2017 Objective Swallowing Evaluation: Type of Study: MBS-Modified Barium Swallow Study Patient Details Name: RONALD VINSANT MRN: [id number] Date of Birth: 1941/01/08 Today's Date: 02/01/2017 Time: SLP Start Time (ACUTE ONLY): 1408-SLP Stop Time (ACUTE ONLY): 1438 SLP Time Calculation  (min) (ACUTE ONLY): 30 min Past Medical History: Past Medical History: Diagnosis Date . Barrett esophagus  . Bronchitis  . Cellulitis  . COPD (chronic obstructive pulmonary disease) (HCC)  . Diastolic heart failure (HCC)  . Essential hypertension  . GERD (gastroesophageal reflux disease)  . Hepatomegaly   ??? . Hypothyroidism  . PAF (paroxysmal atrial fibrillation) (HCC)   Eliquis . Stage III chronic kidney disease  Past Surgical History: Past Surgical History: Procedure Laterality Date . BREAST BIOPSY Right Sept, 2012 . CHOLECYSTECTOMY   . COLONOSCOPY   . UPPER GASTROINTESTINAL ENDOSCOPY   HPI: 76 y.o. female with medical history significant of Afib on Eliquis, Stage 3 CKD, hypothyroidism, GERD, HTN, diastolic heart failure, and COPD. Dx: Hyponatremia, and L LE cellulitis. Pt had BPE yesterday and experienced silent laryngeal penetration and aspiration with liquids during the study. SLP consult received and MBSS ordered due to known aspiration on study yesterday. Subjective: "I have had trouble with pills lately." Assessment / Plan / Recommendation CHL IP CLINICAL IMPRESSIONS 02/01/2017 Clinical Impression Mild oral phase dysphagia characterized by delayed oral transit with puree and barium tablet and pharyngeal phase essentially WFL with trace penetration of thins noted with sequential straw sips. No aspiration noted and no significant residuals. Esophageal sweep completed several times throughout the examination did reveal barium filled esophagus suggestive of delayed emptying and dysmotility. Barium tablet remained near GE junction despite puree and liquid wash. Esophagus never completely cleared. Pt did demonstrate silent aspiration of barium contrast yesterday during BPE, however Pt was in semi-reclined position so this likely negatively impacted pharyngeal phase. Recommend GI f/u (already spoke with Dr. Karilyn Cota who may dilate Pt on Monday). D3/mech soft and thin liquids with aspiration and reflux precautions.  Reflux precautions reviewed extensively with Pt. No further SLP f/u indicated at this time. SLP Visit Diagnosis Dysphagia, pharyngoesophageal phase (R13.14) Attention and concentration deficit following -- Frontal lobe and executive function deficit following -- Impact on safety and function Mild aspiration risk   CHL IP TREATMENT RECOMMENDATION 02/01/2017 Treatment Recommendations No treatment recommended at this time   Prognosis 02/01/2017 Prognosis for Safe Diet Advancement Good Barriers to Reach Goals -- Barriers/Prognosis Comment -- CHL IP DIET RECOMMENDATION 02/01/2017 SLP Diet Recommendations Dysphagia 3 (Mech soft) solids;Thin liquid Liquid Administration via Cup;Straw Medication Administration Whole meds with liquid Compensations Multiple dry swallows after each bite/sip Postural Changes Remain semi-upright after after feeds/meals (Comment);Seated upright at 90 degrees   CHL IP OTHER RECOMMENDATIONS 02/01/2017 Recommended Consults Consider GI evaluation;Consider esophageal assessment Oral Care Recommendations Oral care BID;Patient independent with oral care Other Recommendations Clarify dietary restrictions   CHL IP FOLLOW UP RECOMMENDATIONS 02/01/2017 Follow up Recommendations None   No flowsheet data found.     CHL IP ORAL PHASE 02/01/2017 Oral Phase Impaired Oral - Pudding Teaspoon -- Oral - Pudding Cup -- Oral - Honey Teaspoon -- Oral - Honey Cup -- Oral - Nectar Teaspoon -- Oral - Nectar Cup -- Oral - Nectar Straw -- Oral - Thin Teaspoon -- Oral - Thin Cup -- Oral - Thin Straw -- Oral - Puree Lingual pumping;Reduced posterior propulsion;Delayed oral transit Oral - Mech Soft -- Oral - Regular --  Oral - Multi-Consistency -- Oral - Pill Delayed oral transit Oral Phase - Comment --  CHL IP PHARYNGEAL PHASE 02/01/2017 Pharyngeal Phase Impaired Pharyngeal- Pudding Teaspoon -- Pharyngeal -- Pharyngeal- Pudding Cup -- Pharyngeal -- Pharyngeal- Honey Teaspoon -- Pharyngeal -- Pharyngeal- Honey Cup -- Pharyngeal --  Pharyngeal- Nectar Teaspoon -- Pharyngeal -- Pharyngeal- Nectar Cup -- Pharyngeal -- Pharyngeal- Nectar Straw -- Pharyngeal -- Pharyngeal- Thin Teaspoon -- Pharyngeal -- Pharyngeal- Thin Cup WFL Pharyngeal -- Pharyngeal- Thin Straw Delayed swallow initiation-pyriform sinuses;Delayed swallow initiation-vallecula;Penetration/Aspiration during swallow Pharyngeal Material does not enter airway;Material enters airway, remains ABOVE vocal cords then ejected out Pharyngeal- Puree WFL Pharyngeal -- Pharyngeal- Mechanical Soft -- Pharyngeal -- Pharyngeal- Regular WFL Pharyngeal -- Pharyngeal- Multi-consistency -- Pharyngeal -- Pharyngeal- Pill WFL Pharyngeal -- Pharyngeal Comment --  CHL IP CERVICAL ESOPHAGEAL PHASE 02/01/2017 Cervical Esophageal Phase Impaired Pudding Teaspoon -- Pudding Cup -- Honey Teaspoon -- Honey Cup -- Nectar Teaspoon -- Nectar Cup -- Nectar Straw -- Thin Teaspoon -- Thin Cup -- Thin Straw -- Puree Esophageal backflow into cervical esophagus Mechanical Soft -- Regular -- Multi-consistency -- Pill -- Cervical Esophageal Comment -- Thank you, Havery Moros, CCC-SLP [phone] No flowsheet data found. PORTER,DABNEY 02/01/2017, 3:11 PM               Lab Results: Basic Metabolic Panel:  Recent Labs  86/57/84 1208 02/04/17 0544  NA 132* 136  K 4.3 4.1  CL 102 103  CO2 24 28  GLUCOSE 118* 92  BUN 8 7  CREATININE 0.95 0.78  CALCIUM 7.3* 7.2*  PHOS 3.2 3.4   Liver Function Tests:  Recent Labs  02/03/17 1208 02/04/17 0544  ALBUMIN 2.1* 2.0*     CBC:  Recent Labs  02/06/17 0605  HGB 7.6*  HCT 23.1*    Recent Results (from the past 240 hour(s))  C difficile quick scan w PCR reflex     Status: Abnormal   Collection Time: 01/28/17  4:53 PM  Result Value Ref Range Status   C Diff antigen POSITIVE (A) NEGATIVE Final   C Diff toxin NEGATIVE NEGATIVE Final   C Diff interpretation Results are indeterminate. See PCR results.  Final  Clostridium Difficile by PCR     Status:  None   Collection Time: 01/28/17  4:53 PM  Result Value Ref Range Status   Toxigenic C Difficile by pcr NEGATIVE NEGATIVE Final    Comment: Patient is colonized with non toxigenic C. difficile. May not need treatment unless significant symptoms are present. Performed at [facility], [address] [place], [place] [address]   MRSA PCR Screening     Status: None   Collection Time: 01/28/17 11:24 PM  Result Value Ref Range Status   MRSA by PCR NEGATIVE NEGATIVE Final    Comment:        The GeneXpert MRSA Assay (FDA approved for NASAL specimens only), is one component of a comprehensive MRSA colonization surveillance program. It is not intended to diagnose MRSA infection nor to guide or monitor treatment for MRSA infections.   Gastrointestinal Panel by PCR , Stool     Status: None   Collection Time: 01/29/17  3:15 PM  Result Value Ref Range Status   Campylobacter species NOT DETECTED NOT DETECTED Final   Plesimonas shigelloides NOT DETECTED NOT DETECTED Final   Salmonella species NOT DETECTED NOT DETECTED Final   Yersinia enterocolitica NOT DETECTED NOT DETECTED Final   Vibrio species NOT DETECTED NOT DETECTED Final   Vibrio cholerae  NOT DETECTED NOT DETECTED Final   Enteroaggregative E coli (EAEC) NOT DETECTED NOT DETECTED Final   Enteropathogenic E coli (EPEC) NOT DETECTED NOT DETECTED Final   Enterotoxigenic E coli (ETEC) NOT DETECTED NOT DETECTED Final   Shiga like toxin producing E coli (STEC) NOT DETECTED NOT DETECTED Final   Shigella/Enteroinvasive E coli (EIEC) NOT DETECTED NOT DETECTED Final   Cryptosporidium NOT DETECTED NOT DETECTED Final   Cyclospora cayetanensis NOT DETECTED NOT DETECTED Final   Entamoeba histolytica NOT DETECTED NOT DETECTED Final   Giardia lamblia NOT DETECTED NOT DETECTED Final   Adenovirus F40/41 NOT DETECTED NOT DETECTED Final   Astrovirus NOT DETECTED NOT DETECTED Final   Norovirus GI/GII NOT DETECTED NOT DETECTED Final   Rotavirus A  NOT DETECTED NOT DETECTED Final   Sapovirus (I, II, IV, and V) NOT DETECTED NOT DETECTED Final     Hospital Course: This is a 76 year old who had been in the hospital with altered mental status and was transferred to skilled care facility. She did not do well wasn't eating well complaining of pain and eventually was brought back to the hospital. She was found to have a sodium of 115. She appeared to be quite dehydrated. She was admitted to stepdown. She started on IV fluids. Despite getting IV fluids she developed a acute kidney injury. She was seen by nephrology treated and eventually her renal function returned to normal. Sodium level improved. She was noted to be anemic and she was having trouble swallowing with esophageal dysphagia and she had gastroenterology consultation. She was given IV iron. She was given this twice. She underwent EGD on the 15th which showed that she had a Schatzki's ring had 2 polyps in her stomach some gastritis and a hiatal hernia. She's going to be on a mechanical soft diet. She will use oxygen as needed. She has had physical therapy consultation and it is recommended that she have skilled care facility for rehabilitation. She is much improved and ready for discharge to skilled care facility  Discharge Exam: Blood pressure (!) 118/56, pulse (!) 106, temperature 98.6 F (37 C), temperature source Oral, resp. rate 18, height 5\' 2"  (1.575 m), weight 101.3 kg (223 lb 5.2 oz), SpO2 96 %. She is awake and alert. She still has some swelling of her extremities.  Disposition: To skilled care facility. She will be on mechanical soft diet. She will have oxygen as needed. She needs basic metabolic profile and CBC weekly 4 starting on 02/08/2017. She can restart Eliquis on the evening of 02/07/2017. She needs PT OT and speech. She needs to be weighed every other day and call M.D. if weight goes up by 3 pounds.  Discharge Instructions    Diet - low sodium heart healthy    Complete by:   As directed    Increase activity slowly    Complete by:  As directed      I spent greater than 30 minutes with this discharge process   Signed: Arnold Depinto L   02/06/2017, 9:07 AM

## 2017-02-06 NOTE — Clinical Social Work Note (Signed)
L[facility] today and provided authorization details. LCSW sent clinicals via [employer].   LCSW notified sister, Gerhard MunchFlorence Price, of patient's discharge and that [facility].  LCSW signing off.     Laddie Naeem, Juleen ChinaHeather D, LCSW

## 2017-02-06 NOTE — Progress Notes (Signed)
Patient is to be discharged to [facility] in stable condition. Patient and family aware of move. Report has been called to DixonNatalie at [facility]. Patient will be escorted by staff via wheelchair.  Quita SkyeMorgan P Dishmon, RN

## 2017-02-06 NOTE — Progress Notes (Signed)
Subjective: She feels better. She is sitting up in the bed in no acute distress. No new complaints. Her hemoglobin level is still low so I'm going to give her another dose of IV iron. She had endoscopy yesterday and she says her swallowing is better.  Objective: Vital signs in last 24 hours: Temp:  [98.4 F (36.9 C)-98.7 F (37.1 C)] 98.6 F (37 C) (05/16 0520) Pulse Rate:  [96-116] 106 (05/16 0520) Resp:  [18-25] 18 (05/15 2100) BP: (101-138)/(44-96) 118/56 (05/16 0520) SpO2:  [91 %-99 %] 96 % (05/16 0729) Weight change:  Last BM Date: 02/05/17  Intake/Output from previous day: 05/15 0701 - 05/16 0700 In: 120 [P.O.:120] Out: 1450 [Urine:1450]  PHYSICAL EXAM General appearance: alert, cooperative, no distress and morbidly obese Resp: clear to auscultation bilaterally Cardio: regular rate and rhythm, S1, S2 normal, no murmur, click, rub or gallop GI: soft, non-tender; bowel sounds normal; no masses,  no organomegaly Extremities: She still has some third spacing of IV fluids Skin warm and dry  Lab Results:  Results for orders placed or performed during the hospital encounter of 01/28/17 (from the past 48 hour(s))  Glucose, capillary     Status: Abnormal   Collection Time: 02/04/17 11:19 AM  Result Value Ref Range   Glucose-Capillary 120 (H) 65 - 99 mg/dL  Glucose, capillary     Status: Abnormal   Collection Time: 02/04/17  4:14 PM  Result Value Ref Range   Glucose-Capillary 147 (H) 65 - 99 mg/dL  Glucose, capillary     Status: Abnormal   Collection Time: 02/04/17 10:33 PM  Result Value Ref Range   Glucose-Capillary 149 (H) 65 - 99 mg/dL   Comment 1 Notify RN    Comment 2 Document in Chart   Glucose, capillary     Status: None   Collection Time: 02/05/17  7:46 AM  Result Value Ref Range   Glucose-Capillary 96 65 - 99 mg/dL   Comment 1 Notify RN    Comment 2 Document in Chart   Glucose, capillary     Status: Abnormal   Collection Time: 02/05/17 11:58 AM  Result Value  Ref Range   Glucose-Capillary 126 (H) 65 - 99 mg/dL  Glucose, capillary     Status: Abnormal   Collection Time: 02/05/17  4:31 PM  Result Value Ref Range   Glucose-Capillary 128 (H) 65 - 99 mg/dL   Comment 1 Notify RN    Comment 2 Document in Chart   Glucose, capillary     Status: Abnormal   Collection Time: 02/05/17  8:40 PM  Result Value Ref Range   Glucose-Capillary 130 (H) 65 - 99 mg/dL  Hemoglobin and hematocrit, blood     Status: Abnormal   Collection Time: 02/06/17  6:05 AM  Result Value Ref Range   Hemoglobin 7.6 (L) 12.0 - 15.0 g/dL   HCT 16.1 (L) 09.6 - 04.5 %  Glucose, capillary     Status: None   Collection Time: 02/06/17  7:41 AM  Result Value Ref Range   Glucose-Capillary 99 65 - 99 mg/dL    ABGS No results for input(s): PHART, PO2ART, TCO2, HCO3 in the last 72 hours.  Invalid input(s): PCO2 CULTURES Recent Results (from the past 240 hour(s))  C difficile quick scan w PCR reflex     Status: Abnormal   Collection Time: 01/28/17  4:53 PM  Result Value Ref Range Status   C Diff antigen POSITIVE (A) NEGATIVE Final   C Diff toxin NEGATIVE NEGATIVE  Final   C Diff interpretation Results are indeterminate. See PCR results.  Final  Clostridium Difficile by PCR     Status: None   Collection Time: 01/28/17  4:53 PM  Result Value Ref Range Status   Toxigenic C Difficile by pcr NEGATIVE NEGATIVE Final    Comment: Patient is colonized with non toxigenic C. difficile. May not need treatment unless significant symptoms are present. Performed at [facility], [address] [place], [place] [address]   MRSA PCR Screening     Status: None   Collection Time: 01/28/17 11:24 PM  Result Value Ref Range Status   MRSA by PCR NEGATIVE NEGATIVE Final    Comment:        The GeneXpert MRSA Assay (FDA approved for NASAL specimens only), is one component of a comprehensive MRSA colonization surveillance program. It is not intended to diagnose MRSA infection nor to guide  or monitor treatment for MRSA infections.   Gastrointestinal Panel by PCR , Stool     Status: None   Collection Time: 01/29/17  3:15 PM  Result Value Ref Range Status   Campylobacter species NOT DETECTED NOT DETECTED Final   Plesimonas shigelloides NOT DETECTED NOT DETECTED Final   Salmonella species NOT DETECTED NOT DETECTED Final   Yersinia enterocolitica NOT DETECTED NOT DETECTED Final   Vibrio species NOT DETECTED NOT DETECTED Final   Vibrio cholerae NOT DETECTED NOT DETECTED Final   Enteroaggregative E coli (EAEC) NOT DETECTED NOT DETECTED Final   Enteropathogenic E coli (EPEC) NOT DETECTED NOT DETECTED Final   Enterotoxigenic E coli (ETEC) NOT DETECTED NOT DETECTED Final   Shiga like toxin producing E coli (STEC) NOT DETECTED NOT DETECTED Final   Shigella/Enteroinvasive E coli (EIEC) NOT DETECTED NOT DETECTED Final   Cryptosporidium NOT DETECTED NOT DETECTED Final   Cyclospora cayetanensis NOT DETECTED NOT DETECTED Final   Entamoeba histolytica NOT DETECTED NOT DETECTED Final   Giardia lamblia NOT DETECTED NOT DETECTED Final   Adenovirus F40/41 NOT DETECTED NOT DETECTED Final   Astrovirus NOT DETECTED NOT DETECTED Final   Norovirus GI/GII NOT DETECTED NOT DETECTED Final   Rotavirus A NOT DETECTED NOT DETECTED Final   Sapovirus (I, II, IV, and V) NOT DETECTED NOT DETECTED Final   Studies/Results: No results found.  Medications:  Prior to Admission:  Prescriptions Prior to Admission  Medication Sig Dispense Refill Last Dose  . acetaminophen (TYLENOL) 325 MG tablet Take 2 tablets (650 mg total) by mouth every 6 (six) hours as needed for mild pain (or Fever >/= 101).   01/28/2017 at Unknown time  . albuterol (PROVENTIL HFA;VENTOLIN HFA) 108 (90 Base) MCG/ACT inhaler Inhale 2 puffs into the lungs 4 (four) times daily.    01/28/2017 at Unknown time  . apixaban (ELIQUIS) 5 MG TABS tablet Take 1 tablet (5 mg total) by mouth 2 (two) times daily. 60 tablet 5 01/28/2017 at 800a  . calcium  carbonate (TUMS - DOSED IN MG ELEMENTAL CALCIUM) 500 MG chewable tablet Chew 1 tablet by mouth 2 (two) times daily.   01/28/2017 at Unknown time  . cetirizine (ZYRTEC) 10 MG tablet Take 10 mg by mouth daily.   01/28/2017 at Unknown time  . cholecalciferol (VITAMIN D) 1000 units tablet Take 1,000 Units by mouth every morning.   01/28/2017 at Unknown time  . cholecalciferol (VITAMIN D) 400 units TABS tablet Take 400 Units by mouth daily.   01/28/2017 at Unknown time  . colchicine 0.6 MG tablet Take 0.6 mg  by mouth 2 (two) times daily.   01/28/2017 at Unknown time  . diltiazem (CARDIZEM CD) 120 MG 24 hr capsule Take 1 capsule (120 mg total) by mouth daily. 90 capsule 3 01/28/2017 at Unknown time  . fluticasone furoate-vilanterol (BREO ELLIPTA) 200-25 MCG/INH AEPB Inhale 1 puff into the lungs daily.   01/28/2017 at Unknown time  . gabapentin (NEURONTIN) 100 MG capsule Take 100 mg by mouth 2 (two) times daily.   01/28/2017 at 800a  . HYDROcodone-acetaminophen (NORCO) 7.5-325 MG tablet Take 1 tablet by mouth 3 (three) times daily as needed for moderate pain.   01/27/2017 at Unknown time  . insulin glargine (LANTUS) 100 UNIT/ML injection Inject 0.2 mLs (20 Units total) into the skin daily. 10 mL 11 01/27/2017 at 2000  . insulin lispro (HUMALOG) 100 UNIT/ML injection Inject 3-20 Units into the skin 3 (three) times daily before meals. 70-100= 0 units 101-150=3 units 151-200=4 units 201-250=8 units 251-300=12units 301-350=16units Greater than 350= 20 units   01/28/2017 at Unknown time  . levothyroxine (SYNTHROID, LEVOTHROID) 137 MCG tablet Take 137 mcg by mouth daily before breakfast.   01/28/2017 at Unknown time  . Magnesium 250 MG TABS Take 2 tablets by mouth 2 (two) times daily.    01/28/2017 at Unknown time  . metoprolol tartrate (LOPRESSOR) 25 MG tablet TAKE 1/2 TABLET BY MOUTH TWICE DAILY 90 tablet 1 01/28/2017 at 800a  . Multiple Vitamin (MULTIVITAMIN WITH MINERALS) TABS tablet Take 1 tablet by mouth daily.   01/28/2017 at  Unknown time  . omeprazole (PRILOSEC) 20 MG capsule TAKE ONE CAPSULE BY MOUTH DAILY 90 capsule 3 01/28/2017 at Unknown time  . ondansetron (ZOFRAN) 4 MG tablet Take 1 tablet (4 mg total) by mouth every 6 (six) hours as needed for nausea. 20 tablet 0 01/28/2017 at Unknown time  . potassium chloride SA (K-DUR,KLOR-CON) 20 MEQ tablet Take 1 tablet (20 mEq total) by mouth daily.   01/28/2017 at Unknown time  . torsemide (DEMADEX) 20 MG tablet Take 1 tablet (20 mg total) by mouth 2 (two) times daily. (may take an extra 20mg  as needed)   01/28/2017 at Unknown time  . triamcinolone (NASACORT ALLERGY 24HR) 55 MCG/ACT AERO nasal inhaler Place 2 sprays into the nose daily as needed (for allergies).    unknown  . feeding supplement, GLUCERNA SHAKE, (GLUCERNA SHAKE) LIQD Take 237 mLs by mouth 3 (three) times daily between meals.  0   . gabapentin (NEURONTIN) 300 MG capsule Take 1 capsule (300 mg total) by mouth 2 (two) times daily. (Patient not taking: Reported on 01/28/2017)   Not Taking at Unknown time  . polyvinyl alcohol (LIQUIFILM TEARS) 1.4 % ophthalmic solution Place 1 drop into both eyes 4 (four) times daily as needed for dry eyes. 15 mL 0 unknown   Scheduled: . albuterol  3 mL Inhalation TID  . allopurinol  150 mg Oral Daily  . colchicine  0.3 mg Oral Daily  . docusate sodium  100 mg Oral BID  . feeding supplement (GLUCERNA SHAKE)  237 mL Oral BID BM  . fluticasone furoate-vilanterol  1 puff Inhalation Daily  . furosemide  40 mg Intravenous Daily  . gabapentin  300 mg Oral BID  . triamcinolone cream   Topical BID   And  . hydrocerin   Topical BID  . insulin aspart  0-15 Units Subcutaneous TID WC  . levothyroxine  137 mcg Oral QAC breakfast  . loratadine  10 mg Oral Daily  . mouth rinse  15  mL Mouth Rinse BID  . metoprolol tartrate  12.5 mg Oral BID  . pantoprazole  40 mg Oral Daily  . sodium chloride flush  3 mL Intravenous Q12H   Continuous: . ferumoxytol     NGE:[id number] **OR**  acetaminophen, HYDROcodone-acetaminophen, ondansetron **OR** ondansetron (ZOFRAN) IV, polyvinyl alcohol, zolpidem  Assesment: She was admitted with hyponatremia with sodium level of 115.this is now normal.  She was significantly dehydrated and that is better.  She had acute kidney injury but renal function is now back to normal.  She had hypokalemia and that has been replaced . She has been anemic and she is known to be iron deficient and does not absorb iron so she's going to get IV iron and has had 2 doses in the hospital. She has been anticoagulated because of paroxysmal atrial fibrillation and that will need to be held until the 17th.  At baseline she has COPD. This is stable  At baseline she has diastolic heart failure and she had a lot of third spacing of fluid with perhaps an exacerbation of her heart failure. She has been on Lasix and this is improved  She has diabetes which is pretty well controlled  She has hypertension which is well controlled today  She has sleep apnea has been noncompliant with CPAP and I told her we want to revisit that because it could cause a lot of her other medical problems to be worse.  She had minimal cellulitis of her left leg and that's improved  She had markedly elevated uric acid level in the past has not tolerated allopurinol but she's doing okay on it now  She had protein calorie malnutrition which is improving she is on supplements etc.  She has extreme deconditioning and is going to need to be in a skilled care facility  She's had some problems with anxiety and depression but that seems to be doing okay  She had Schatzki's ring which was dilated. She has a hiatal hernia. She had 2 gastric polyps that looked benign and she has some gastric erosions. H. pylori serology is pending  She has esophageal dysphagia and will remain on dysphagia 3 diet Principal Problem:   Hyponatremia Active Problems:   HTN (hypertension)   COPD (chronic  obstructive pulmonary disease) (HCC)   Neuropathic pain   Hypothyroidism   PAF (paroxysmal atrial fibrillation) (HCC)   Cellulitis of left lower extremity   Chronic anticoagulation   Sleep apnea   Gout attack   Hyperglycemia   Elevated LFTs   Diabetes mellitus type 2 in obese [facility])   Esophageal dysphagia    Plan: Transfer to skilled care facility today    LOS: 9 days   Keenan Trefry L 02/06/2017, 8:50 AM

## 2017-02-06 NOTE — Progress Notes (Signed)
Pt asleep when I went to set CPAP.Marland Kitchen. Asked pt if she was ready to put masked on and she said she did not want to try to wear it tonight she was sleeping find and didn't feel like wearing it. Wanted to go back to sleep.

## 2017-02-06 NOTE — Care Management Important Message (Signed)
Important Message  Patient Details  Name: Yolanda [name] MRN: [id number] Date of Birth: [birth year]   Medicare Important Message Given:  Yes    Dalayah Deahl, Chrystine OilerSharley Diane, RN 02/06/2017, 10:20 AM

## 2017-02-08 ENCOUNTER — Encounter (HOSPITAL_COMMUNITY)
Admission: RE | Admit: 2017-02-08 | Discharge: 2017-02-08 | Disposition: A | Discharge status: Home / Self Care | Payer: Medicare Other | Source: Skilled Nursing Facility | Attending: Pulmonary Disease | Admitting: Pulmonary Disease

## 2017-02-08 DIAGNOSIS — E871 Hypo-osmolality and hyponatremia: Secondary | ICD-10-CM | POA: Insufficient documentation

## 2017-02-08 LAB — BASIC METABOLIC PANEL
Anion gap: 10 (ref 5–15)
BUN: 8 mg/dL (ref 6–20)
CALCIUM: 6.8 mg/dL — AB (ref 8.9–10.3)
CO2: 30 mmol/L (ref 22–32)
CREATININE: 0.97 mg/dL (ref 0.44–1.00)
Chloride: 96 mmol/L — ABNORMAL LOW (ref 101–111)
GFR calc Af Amer: >60 mL/min (ref >60)
GFR, EST NON AFRICAN AMERICAN: 57 mL/min — AB (ref >60)
GLUCOSE: 93 mg/dL (ref 65–99)
Potassium: 3.2 mmol/L — ABNORMAL LOW (ref 3.5–5.1)
SODIUM: 136 mmol/L (ref 135–145)

## 2017-02-08 LAB — CBC
HCT: 24.2 % — ABNORMAL LOW (ref 36.0–46.0)
Hemoglobin: 8.1 g/dL — ABNORMAL LOW (ref 12.0–15.0)
MCH: 32.1 pg (ref 26.0–34.0)
MCHC: 33.5 g/dL (ref 30.0–36.0)
MCV: 96 fL (ref 78.0–100.0)
PLATELETS: 237 10*3/uL (ref 150–400)
RBC: 2.52 MIL/uL — ABNORMAL LOW (ref 3.87–5.11)
RDW: 18.2 % — ABNORMAL HIGH (ref 11.5–15.5)
WBC: 10 10*3/uL (ref 4.0–10.5)

## 2017-02-09 NOTE — H&P (Signed)
Yolanda [name] MRN: [id number] DOB/AGE: 1940-11-23 76 y.o. Primary Care Physician:Madilyn Cephas, Ramon Dredge, MD Admit date: 02/06/2017 Chief Complaint: For rehabilitation HPI: This is documentation of history and physical done yesterday 02/08/2017 at the skilled care facility. When I went to dictate my note the entire computer system was down so it could not be dictated at that point. I arrived at her room at around 8:30 and she had 2 sisters at bedside. She was anxious. She was complaining of depression. She said she wanted to go home. I told her that she was to weak to go home at this point. She still has a lot of edema and I think her nutrition is still an issue. She does have heart failure at baseline. She has not noticed any rapid atrial fib. She says she's eating but her family says she's not eating very well. She's not had any chest pain. She is able to swallow better after her procedure. No bleeding. Her breathing is pretty good. No other new complaints. No nausea or vomiting. No chest pain. No abdominal pain. No trouble breathing. No current swallowing difficulties.  Past Medical History:  Diagnosis Date  . Barrett esophagus   . Bronchitis   . Cellulitis   . COPD (chronic obstructive pulmonary disease) (HCC)   . Diastolic heart failure (HCC)   . Essential hypertension   . GERD (gastroesophageal reflux disease)   . Hepatomegaly    ???  . Hypothyroidism   . PAF (paroxysmal atrial fibrillation) (HCC)    Eliquis  . Stage III chronic kidney disease    Past Surgical History:  Procedure Laterality Date  . BREAST BIOPSY Right Sept, 2012  . CHOLECYSTECTOMY    . COLONOSCOPY    . ESOPHAGEAL DILATION N/A 02/05/2017   Procedure: ESOPHAGEAL DILATION;  Surgeon: Malissa Hippo, MD;  Location: [facility];  Service: Endoscopy;  Laterality: N/A;  . ESOPHAGOGASTRODUODENOSCOPY N/A 02/05/2017   Procedure: ESOPHAGOGASTRODUODENOSCOPY (EGD);  Surgeon: Malissa Hippo, MD;  Location: [facility];  Service:  Endoscopy;  Laterality: N/A;  . UPPER GASTROINTESTINAL ENDOSCOPY          Family History  Problem Relation Age of Onset  . Dementia Mother   . Lung cancer Father        smoked  . Hypertension Sister   . Diabetes Brother   . Obesity Sister   . Hypertension Sister   . Restless legs syndrome Sister   . Healthy Sister   . Lung cancer Maternal Uncle     Social History:  reports that she quit smoking about 17 years ago. Her smoking use included Cigarettes. She started smoking about 58 years ago. She has a 15.00 pack-year smoking history. She has never used smokeless tobacco. She reports that she does not drink alcohol or use drugs.   Allergies:  Allergies  Allergen Reactions  . Ciprofloxacin Shortness Of Breath    REACTION: sob,tachycardia  . Doxycycline Nausea Only    Also experienced diarrhea   . Fish Oil     Patient face drew to the side,Bells Palsey  . Guaifenesin Shortness Of Breath    REACTION: sob,tachycardia  . Mucinex [Guaifenesin Er] Shortness Of Breath  . Sulfamethoxazole W/Trimethoprim 800-160 [Sulfamethoxazole-Trimethoprim] Nausea Only    Also lack of appetite   . Uloric [Febuxostat] Swelling    No urination  . Adhesive [Tape] Other (See Comments)    Causes blisters on skin  . Allopurinol Other (See Comments)    Couldn't urinate  . Cefuroxime Axetil  Swelling    Swelling all over body-per patient she was hospitalized as a result of taking this medication  . Metolazone Nausea Only    Swelling   . Tramadol     insomnia    Medications Prior to Admission  Medication Sig Dispense Refill  . acetaminophen (TYLENOL) 325 MG tablet Take 2 tablets (650 mg total) by mouth every 6 (six) hours as needed for mild pain (or Fever >/= 101).    Marland Kitchen albuterol (PROVENTIL HFA;VENTOLIN HFA) 108 (90 Base) MCG/ACT inhaler Inhale 2 puffs into the lungs 4 (four) times daily.     Marland Kitchen allopurinol (ZYLOPRIM) 300 MG tablet Take 0.5 tablets (150 mg total) by mouth daily.    Marland Kitchen ALPRAZolam  (XANAX) 0.25 MG tablet Take 1 tablet (0.25 mg total) by mouth 2 (two) times daily as needed for anxiety. 60 tablet 0  . apixaban (ELIQUIS) 5 MG TABS tablet Take 1 tablet (5 mg total) by mouth 2 (two) times daily. 60 tablet 5  . calcium carbonate (TUMS - DOSED IN MG ELEMENTAL CALCIUM) 500 MG chewable tablet Chew 1 tablet by mouth 2 (two) times daily.    . cetirizine (ZYRTEC) 10 MG tablet Take 10 mg by mouth daily.    . cholecalciferol (VITAMIN D) 1000 units tablet Take 1,000 Units by mouth every morning.    . colchicine 0.6 MG tablet Take 1 tablet (0.6 mg total) by mouth 2 (two) times daily.    Marland Kitchen diltiazem (CARDIZEM CD) 120 MG 24 hr capsule Take 1 capsule (120 mg total) by mouth daily. 90 capsule 3  . Doxepin HCl (SILENOR) 6 MG TABS Take 1 tablet (6 mg total) by mouth at bedtime as needed. 30 tablet   . feeding supplement, GLUCERNA SHAKE, (GLUCERNA SHAKE) LIQD Take 237 mLs by mouth 3 (three) times daily between meals.  0  . fluticasone furoate-vilanterol (BREO ELLIPTA) 200-25 MCG/INH AEPB Inhale 1 puff into the lungs daily.    Marland Kitchen gabapentin (NEURONTIN) 100 MG capsule Take 100 mg by mouth 2 (two) times daily.    Marland Kitchen HYDROcodone-acetaminophen (NORCO) 7.5-325 MG tablet Take 1 tablet by mouth 3 (three) times daily as needed for moderate pain.    Marland Kitchen insulin glargine (LANTUS) 100 UNIT/ML injection Inject 0.2 mLs (20 Units total) into the skin daily. 10 mL 11  . insulin lispro (HUMALOG) 100 UNIT/ML injection Inject 3-20 Units into the skin 3 (three) times daily before meals. 70-100= 0 units 101-150=3 units 151-200=4 units 201-250=8 units 251-300=12units 301-350=16units Greater than 350= 20 units    . levothyroxine (SYNTHROID, LEVOTHROID) 137 MCG tablet Take 137 mcg by mouth daily before breakfast.    . metoprolol tartrate (LOPRESSOR) 25 MG tablet TAKE 1/2 TABLET BY MOUTH TWICE DAILY 90 tablet 1  . Multiple Vitamin (MULTIVITAMIN WITH MINERALS) TABS tablet Take 1 tablet by mouth daily.    Marland Kitchen omeprazole  (PRILOSEC) 20 MG capsule TAKE ONE CAPSULE BY MOUTH DAILY 90 capsule 3  . ondansetron (ZOFRAN) 4 MG tablet Take 1 tablet (4 mg total) by mouth every 6 (six) hours as needed for nausea. 20 tablet 0  . polyvinyl alcohol (LIQUIFILM TEARS) 1.4 % ophthalmic solution Place 1 drop into both eyes 4 (four) times daily as needed for dry eyes. 15 mL 0  . potassium chloride SA (K-DUR,KLOR-CON) 20 MEQ tablet Take 1 tablet (20 mEq total) by mouth daily.    Marland Kitchen torsemide (DEMADEX) 20 MG tablet Take 1 tablet (20 mg total) by mouth 2 (two) times daily. (may take an  extra 20mg  as needed)    . triamcinolone (NASACORT ALLERGY 24HR) 55 MCG/ACT AERO nasal inhaler Place 2 sprays into the nose daily as needed (for allergies).          XBJ:YNWGN from the symptoms mentioned above,there are no other symptoms referable to all systems reviewed.  Physical Exam: There were no vitals taken for this visit. Constitutional: She is awake and alert and appears anxious. She is obese. Eyes: Pupils react ears nose mouth and throat: Mucous membranes are moist. Hearing is grossly normal. Her throat is clear. Cardiovascular: Her heart is regular now. No gallop. She does have diffuse edema but I think this is not cardiac in origin. Respiratory: Her respiratory effort is normal. Her lungs are clear. Gastrointestinal: Her abdomen is soft obese with no masses musculoskeletal: She has significant weakness. Neurological: No focal abnormalities. Psychiatric: She does appear anxious and depressed    Recent Labs  02/08/17 0615  WBC 10.0  HGB 8.1*  HCT 24.2*  MCV 96.0  PLT 237    Recent Labs  02/08/17 0615  NA 136  K 3.2*  CL 96*  CO2 30  GLUCOSE 93  BUN 8  CREATININE 0.97  CALCIUM 6.8*  lablast2(ast:2,ALT:2,alkphos:2,bilitot:2,prot:2,albumin:2)@    No results found for this or any previous visit (from the past 240 hour(s)).   Dg Chest 1 View  Result Date: 01/28/2017 CLINICAL DATA:  Acute onset of shortness of breath.  Initial encounter. EXAM: CHEST 1 VIEW COMPARISON:  Chest radiograph performed 12/30/2016 FINDINGS: The lungs are well-aerated. Minimal bibasilar atelectasis is noted. There is no evidence of pleural effusion or pneumothorax. The cardiomediastinal silhouette is mildly enlarged. No acute osseous abnormalities are seen. IMPRESSION: Minimal bibasilar atelectasis noted.  Lungs otherwise clear. Electronically Signed   By: Roanna Raider M.D.   On: 01/28/2017 19:36   Ct Head Wo Contrast  Result Date: 01/28/2017 CLINICAL DATA:  Weakness.  Nausea, loose stools and diarrhea. EXAM: CT HEAD WITHOUT CONTRAST TECHNIQUE: Contiguous axial images were obtained from the base of the skull through the vertex without intravenous contrast. COMPARISON:  Head CT 12/31/2016 FINDINGS: Brain: No evidence of acute infarction, hemorrhage, hydrocephalus, extra-axial collection or mass lesion/mass effect. Mild chronic small vessel ischemia is stable from prior exam. Vascular: Atherosclerosis of skullbase vasculature without hyperdense vessel or abnormal calcification. Skull: Normal. Negative for fracture or focal lesion. Sinuses/Orbits: Paranasal sinuses and mastoid air cells are clear. The visualized orbits are unchanged with mild bilateral exophthalmos. Other: None. IMPRESSION: No acute intracranial abnormality. Electronically Signed   By: Rubye Oaks M.D.   On: 01/28/2017 19:51   Ct Abdomen Pelvis W Contrast  Result Date: 01/28/2017 CLINICAL DATA:  Shortness of breath, weakness, nausea and diarrhea EXAM: CT ABDOMEN AND PELVIS WITH CONTRAST TECHNIQUE: Multidetector CT imaging of the abdomen and pelvis was performed using the standard protocol following bolus administration of intravenous contrast. CONTRAST:  75mL ISOVUE-300 IOPAMIDOL (ISOVUE-300) INJECTION 61% COMPARISON:  02/02/2015 FINDINGS: Lower chest: Mild cardiomegaly. No pericardial or pleural effusion. Bibasilar atelectasis. Atherosclerosis of the descending thoracic aorta.  Hepatobiliary: Mild hypoattenuation of the liver parenchyma compatible with hepatic steatosis or fatty infiltration. No focal hepatic abnormality or biliary dilatation. Patent hepatic and portal veins. Remote cholecystectomy noted. Pancreas: Unremarkable. No pancreatic ductal dilatation or surrounding inflammatory changes. Spleen: Normal in size without focal abnormality. Adrenals/Urinary Tract: Adrenal glands are unremarkable. Kidneys are normal, without renal calculi, focal lesion, or hydronephrosis. Bladder is unremarkable. Stomach/Bowel: Negative for bowel obstruction, significant dilatation, ileus, or free air. No fluid  collection or abscess. Remote appendectomy noted. Colon is collapsed. Minor scattered diverticulosis. Vascular/Lymphatic: Aortic atherosclerosis noted without aneurysm or dissection. No occlusive process. No retroperitoneal hemorrhage. No adenopathy. Reproductive: Uterus and bilateral adnexa are unremarkable. Other: No abdominal wall hernia or abnormality. No abdominopelvic ascites. Musculoskeletal: Diffuse lumbar spondylosis and facet arthropathy. No acute osseous finding or compression fracture. IMPRESSION: No acute intra-abdominal or pelvic finding by CT. Remote cholecystectomy and appendectomy. Hepatic steatosis Abdominal atherosclerosis Diverticulosis. Electronically Signed   By: Judie Petit.  Shick M.D.   On: 01/28/2017 19:57   Dg Esophagus  Result Date: 01/31/2017 CLINICAL DATA:  Dysphagia EXAM: ESOPHOGRAM / BARIUM SWALLOW / BARIUM TABLET STUDY TECHNIQUE: Combined double contrast and single contrast examination performed using effervescent crystals, thick barium liquid, and thin barium liquid. The patient was observed with fluoroscopy swallowing a 13 mm barium sulphate tablet. Study performed with head of bed elevated 30-45 degrees during the exam. Due to aspiration, procedure was terminated prematurely. FLUOROSCOPY TIME:  Fluoroscopy Time:  2 minutes 42 seconds Radiation Exposure Index (if  provided by the fluoroscopic device): 38.2 mGy Number of Acquired Spot Images: multiple fluoroscopic screen captures COMPARISON:  None FINDINGS: Esophageal distention: Question mall narrowing at the gastroesophageal junction. Remainder of esophagus appears to distend normally. Filling defects:  None 12.5 mm barium tablet: Did not pass beyond the distal esophagus and the stomach, though uncertain if this is due to obstruction or severe dysmotility. Multiple swallows of water or unable to pass the pill into the stomach. Motility:  Diffusely impaired Mucosa: On limited assessment walls appeared smooth without irregularity or ulceration. Hypopharynx/cervical esophagus: Dedicated AP and lateral views of the hypopharynx and cervical esophagus during swallowing were not obtained due to early termination of the procedure. Gross laryngeal penetration and aspiration of contrast were identified during the initial swallows of thick barium. Contrast was seen traveling down the RIGHT lateral wall of the trachea into the RIGHT mainstem bronchus with the patient elevated to 45 degrees. No spontaneous cough. Hiatal hernia:  Not identified GE reflux:  Not identified during the period of imaging. Other:  N/A IMPRESSION: Gross laryngeal penetration and aspiration of contrast with barium extending down the RIGHT lateral wall of the trachea into the RIGHT mainstem bronchus without identification of a spontaneous cough reflex. This necessitated early termination of the procedure. 12.5 mm diameter barium tablet passed to the distal esophagus but did not pass into the stomach, potentially due to narrowing of the distal esophagus at the gastroesophageal junction though cannot completely exclude a component of esophageal dysmotility. Electronically Signed   By: Ulyses Southward M.D.   On: 01/31/2017 15:38   Dg Swallowing Func-speech Pathology  Result Date: 02/01/2017 Objective Swallowing Evaluation: Type of Study: MBS-Modified Barium Swallow  Study Patient Details Name: DOMINIK LAURICELLA MRN: [id number] Date of Birth: [birth year] Today's Date: 02/01/2017 Time: SLP Start Time (ACUTE ONLY): 1408-SLP Stop Time (ACUTE ONLY): 1438 SLP Time Calculation (min) (ACUTE ONLY): 30 min Past Medical History: Past Medical History: Diagnosis Date . Barrett esophagus  . Bronchitis  . Cellulitis  . COPD (chronic obstructive pulmonary disease) (HCC)  . Diastolic heart failure (HCC)  . Essential hypertension  . GERD (gastroesophageal reflux disease)  . Hepatomegaly   ??? . Hypothyroidism  . PAF (paroxysmal atrial fibrillation) (HCC)   Eliquis . Stage III chronic kidney disease  Past Surgical History: Past Surgical History: Procedure Laterality Date . BREAST BIOPSY Right Sept, 2012 . CHOLECYSTECTOMY   . COLONOSCOPY   . UPPER GASTROINTESTINAL ENDOSCOPY   HPI: 76  y.o. female with medical history significant of Afib on Eliquis, Stage 3 CKD, hypothyroidism, GERD, HTN, diastolic heart failure, and COPD. Dx: Hyponatremia, and L LE cellulitis. Pt had BPE yesterday and experienced silent laryngeal penetration and aspiration with liquids during the study. SLP consult received and MBSS ordered due to known aspiration on study yesterday. Subjective: "I have had trouble with pills lately." Assessment / Plan / Recommendation CHL IP CLINICAL IMPRESSIONS 02/01/2017 Clinical Impression Mild oral phase dysphagia characterized by delayed oral transit with puree and barium tablet and pharyngeal phase essentially WFL with trace penetration of thins noted with sequential straw sips. No aspiration noted and no significant residuals. Esophageal sweep completed several times throughout the examination did reveal barium filled esophagus suggestive of delayed emptying and dysmotility. Barium tablet remained near GE junction despite puree and liquid wash. Esophagus never completely cleared. Pt did demonstrate silent aspiration of barium contrast yesterday during BPE, however Pt was in semi-reclined position  so this likely negatively impacted pharyngeal phase. Recommend GI f/u (already spoke with Dr. Karilyn Cota who may dilate Pt on Monday). D3/mech soft and thin liquids with aspiration and reflux precautions. Reflux precautions reviewed extensively with Pt. No further SLP f/u indicated at this time. SLP Visit Diagnosis Dysphagia, pharyngoesophageal phase (R13.14) Attention and concentration deficit following -- Frontal lobe and executive function deficit following -- Impact on safety and function Mild aspiration risk   CHL IP TREATMENT RECOMMENDATION 02/01/2017 Treatment Recommendations No treatment recommended at this time   Prognosis 02/01/2017 Prognosis for Safe Diet Advancement Good Barriers to Reach Goals -- Barriers/Prognosis Comment -- CHL IP DIET RECOMMENDATION 02/01/2017 SLP Diet Recommendations Dysphagia 3 (Mech soft) solids;Thin liquid Liquid Administration via Cup;Straw Medication Administration Whole meds with liquid Compensations Multiple dry swallows after each bite/sip Postural Changes Remain semi-upright after after feeds/meals (Comment);Seated upright at 90 degrees   CHL IP OTHER RECOMMENDATIONS 02/01/2017 Recommended Consults Consider GI evaluation;Consider esophageal assessment Oral Care Recommendations Oral care BID;Patient independent with oral care Other Recommendations Clarify dietary restrictions   CHL IP FOLLOW UP RECOMMENDATIONS 02/01/2017 Follow up Recommendations None   No flowsheet data found.     CHL IP ORAL PHASE 02/01/2017 Oral Phase Impaired Oral - Pudding Teaspoon -- Oral - Pudding Cup -- Oral - Honey Teaspoon -- Oral - Honey Cup -- Oral - Nectar Teaspoon -- Oral - Nectar Cup -- Oral - Nectar Straw -- Oral - Thin Teaspoon -- Oral - Thin Cup -- Oral - Thin Straw -- Oral - Puree Lingual pumping;Reduced posterior propulsion;Delayed oral transit Oral - Mech Soft -- Oral - Regular -- Oral - Multi-Consistency -- Oral - Pill Delayed oral transit Oral Phase - Comment --  CHL IP PHARYNGEAL PHASE  02/01/2017 Pharyngeal Phase Impaired Pharyngeal- Pudding Teaspoon -- Pharyngeal -- Pharyngeal- Pudding Cup -- Pharyngeal -- Pharyngeal- Honey Teaspoon -- Pharyngeal -- Pharyngeal- Honey Cup -- Pharyngeal -- Pharyngeal- Nectar Teaspoon -- Pharyngeal -- Pharyngeal- Nectar Cup -- Pharyngeal -- Pharyngeal- Nectar Straw -- Pharyngeal -- Pharyngeal- Thin Teaspoon -- Pharyngeal -- Pharyngeal- Thin Cup WFL Pharyngeal -- Pharyngeal- Thin Straw Delayed swallow initiation-pyriform sinuses;Delayed swallow initiation-vallecula;Penetration/Aspiration during swallow Pharyngeal Material does not enter airway;Material enters airway, remains ABOVE vocal cords then ejected out Pharyngeal- Puree WFL Pharyngeal -- Pharyngeal- Mechanical Soft -- Pharyngeal -- Pharyngeal- Regular WFL Pharyngeal -- Pharyngeal- Multi-consistency -- Pharyngeal -- Pharyngeal- Pill WFL Pharyngeal -- Pharyngeal Comment --  CHL IP CERVICAL ESOPHAGEAL PHASE 02/01/2017 Cervical Esophageal Phase Impaired Pudding Teaspoon -- Pudding Cup -- Honey Teaspoon -- Honey Cup -- Nectar Teaspoon --  Nectar Cup -- Nectar Straw -- Thin Teaspoon -- Thin Cup -- Thin Straw -- Puree Esophageal backflow into cervical esophagus Mechanical Soft -- Regular -- Multi-consistency -- Pill -- Cervical Esophageal Comment -- Thank you, Havery MorosDabney Porter, CCC-SLP [phone] No flowsheet data found. PORTER,DABNEY 02/01/2017, 3:11 PM              Impression: She has severe deconditioning. She had been in the hospital with altered mental status was sent to a different skilled care facility but did not do well there. She wasn't eating well and she eventually was brought back to the hospital and was found to have sodium level of about 115. This corrected over the next week or so. She was found to have malnutrition and she's been on nutritional supplements. At baseline she has COPD and that's pretty stable. At baseline she has chronic diastolic heart failure and although she has diffuse edema I think  this is more related to her nutritional status than it is to her heart however she is being treated for heart failure as well. She has paroxysmal atrial fibrillation. This is stable. She is chronically anticoagulated. She has recently had EGD and dilatation. She is swallowing better. She has anxiety and has Xanax available she has depression I think that's going to need to be treated Active Problems:   * No active hospital problems. *     Plan: Add Lexapro. Continue other treatments. After I saw her her potassium came back at 3.4 so she'll need more potassium replacement. Continue with PT OT and speech. Encourage her to increase food intake.      Harjot Dibello L   02/09/2017, 8:58 AM

## 2017-02-10 ENCOUNTER — Emergency Department (HOSPITAL_COMMUNITY): Payer: Medicare Other

## 2017-02-10 ENCOUNTER — Inpatient Hospital Stay
Admission: RE | Admit: 2017-02-10 | Discharge: 2017-03-12 | Disposition: A | Discharge status: Nursing Home (Includes ICF) | Payer: Medicare Other | Source: Ambulatory Visit | Attending: Pulmonary Disease | Admitting: Pulmonary Disease

## 2017-02-10 ENCOUNTER — Emergency Department (HOSPITAL_COMMUNITY)
Admission: EM | Admit: 2017-02-10 | Discharge: 2017-02-10 | Disposition: A | Discharge status: Home / Self Care | Payer: Medicare Other | Attending: Emergency Medicine | Admitting: Emergency Medicine

## 2017-02-10 ENCOUNTER — Encounter (HOSPITAL_COMMUNITY): Payer: Self-pay | Admitting: Emergency Medicine

## 2017-02-10 DIAGNOSIS — E039 Hypothyroidism, unspecified: Secondary | ICD-10-CM | POA: Insufficient documentation

## 2017-02-10 DIAGNOSIS — D649 Anemia, unspecified: Secondary | ICD-10-CM | POA: Diagnosis not present

## 2017-02-10 DIAGNOSIS — Z79899 Other long term (current) drug therapy: Secondary | ICD-10-CM | POA: Insufficient documentation

## 2017-02-10 DIAGNOSIS — I5030 Unspecified diastolic (congestive) heart failure: Secondary | ICD-10-CM | POA: Diagnosis not present

## 2017-02-10 DIAGNOSIS — E860 Dehydration: Secondary | ICD-10-CM

## 2017-02-10 DIAGNOSIS — R197 Diarrhea, unspecified: Secondary | ICD-10-CM | POA: Diagnosis present

## 2017-02-10 DIAGNOSIS — I130 Hypertensive heart and chronic kidney disease with heart failure and stage 1 through stage 4 chronic kidney disease, or unspecified chronic kidney disease: Secondary | ICD-10-CM | POA: Insufficient documentation

## 2017-02-10 DIAGNOSIS — Z794 Long term (current) use of insulin: Secondary | ICD-10-CM | POA: Insufficient documentation

## 2017-02-10 DIAGNOSIS — E876 Hypokalemia: Secondary | ICD-10-CM | POA: Diagnosis not present

## 2017-02-10 DIAGNOSIS — R21 Rash and other nonspecific skin eruption: Secondary | ICD-10-CM | POA: Insufficient documentation

## 2017-02-10 DIAGNOSIS — N183 Chronic kidney disease, stage 3 (moderate): Secondary | ICD-10-CM | POA: Diagnosis not present

## 2017-02-10 DIAGNOSIS — Z87891 Personal history of nicotine dependence: Secondary | ICD-10-CM | POA: Insufficient documentation

## 2017-02-10 DIAGNOSIS — J449 Chronic obstructive pulmonary disease, unspecified: Secondary | ICD-10-CM | POA: Diagnosis not present

## 2017-02-10 LAB — POC OCCULT BLOOD, ED: FECAL OCCULT BLD: NEGATIVE

## 2017-02-10 MED ORDER — POTASSIUM CHLORIDE CRYS ER 20 MEQ PO TBCR
40.0000 meq | EXTENDED_RELEASE_TABLET | Freq: Once | ORAL | Status: AC
  Administered 2017-02-10: 40 meq via ORAL
  Filled 2017-02-10: qty 2

## 2017-02-10 MED ORDER — METOCLOPRAMIDE HCL 5 MG/ML IJ SOLN
5.0000 mg | Freq: Once | INTRAMUSCULAR | Status: AC
  Administered 2017-02-10: 5 mg via INTRAVENOUS
  Filled 2017-02-10: qty 2

## 2017-02-10 MED ORDER — SODIUM CHLORIDE 0.9 % IV BOLUS (SEPSIS)
1000.0000 mL | Freq: Once | INTRAVENOUS | Status: AC
  Administered 2017-02-10: 1000 mL via INTRAVENOUS

## 2017-02-10 MED ORDER — METOCLOPRAMIDE HCL 10 MG PO TABS
5.0000 mg | ORAL_TABLET | Freq: Four times a day (QID) | ORAL | 0 refills | Status: DC | PRN
Start: 2017-02-10 — End: 2017-04-02

## 2017-02-10 NOTE — Discharge Instructions (Signed)
Make sure that you drink at least six 8 ounce glasses of water or Gatorade each day in order to stay well-hydrated. Take the medication as prescribed as needed for nausea. Take Imodium as directed for diarrhea. Call Dr. Juanetta GoslingHawkins to arrange to be reevaluated this week. Return if your condition worsens for any reason

## 2017-02-10 NOTE — ED Notes (Signed)
Pt. To xray 1315

## 2017-02-10 NOTE — ED Provider Notes (Addendum)
[facility]-EMER[facility] DEPT Provider Note   CSN: [id number] Arrival date & time: 02/10/17  1200  By signing my name below, I, Doreatha Martin, attest that this documentation has been prepared under the direction and in the presence of Doug Sou, MD. Electronically Signed: Doreatha Martin, ED Scribe. 02/10/17. 12:48 PM.     History   Chief Complaint Chief Complaint  Patient presents with  . Diarrhea    HPI Yolanda [name] is a 76 y.o. female with h/o DM who presents to the Emergency Department complaining of intermittent diarrhea x4-5 that began yesterday with associated nausea. Pt is not sure if her diarrhea is bloody. Pt is currently staying at the [facility] for rehab after  UTIWhich occurred 1.5 months ago. Pt has had multiple admissions and rehab stays in the last 1.5 months, and sister states the pts condition has gotten progressively worse throughout these stays. Pt has h/o cholecystectomy. She denies vomiting, fever, any other associated symptoms. No treatment prior to coming here. No vomiting. No fever  The history is provided by the patient. No language interpreter was used.    Past Medical History:  Diagnosis Date  . Barrett esophagus   . Bronchitis   . Cellulitis   . COPD (chronic obstructive pulmonary disease) (HCC)   . Diastolic heart failure (HCC)   . Essential hypertension   . GERD (gastroesophageal reflux disease)   . Hepatomegaly    ???  . Hypothyroidism   . PAF (paroxysmal atrial fibrillation) (HCC)    Eliquis  . Stage III chronic kidney disease     Patient Active Problem List   Diagnosis Date Noted  . Schatzki's ring 02/06/2017  . Gastritis 02/06/2017  . Esophageal dysphagia   . Elevated LFTs 01/28/2017  . Diabetes mellitus type 2 in obese (HCC) 01/28/2017  . Metabolic encephalopathy 01/01/2017  . Acute encephalopathy 12/30/2016  . Acute lower UTI 12/30/2016  . Pain, dental 12/30/2016  . Spasm of back muscles 12/30/2016  . Hyperglycemia 12/30/2016  . Gout  attack 09/07/2016  . Chronic anticoagulation 02/02/2016  . Chronic edema 02/02/2016  . Obesity 02/02/2016  . Sleep apnea 02/02/2016  . RBBB 02/02/2016  . Acute on chronic diastolic heart failure (HCC) 01/13/2016  . Hyponatremia 01/06/2016  . AKI (acute kidney injury) (HCC) 01/06/2016  . Cellulitis of left lower extremity 12/15/2014  . PAF (paroxysmal atrial fibrillation) (HCC) 11/09/2014  . COPD (chronic obstructive pulmonary disease) (HCC) 11/06/2014  . Neuropathic pain 11/06/2014  . Hypothyroidism 11/06/2014  . Hyperkalemia 11/06/2014  . HTN (hypertension) 01/12/2014  . Upper airway cough syndrome 09/03/2013  . GERD (gastroesophageal reflux disease) 11/13/2011  . Short-segment Barrett's esophagus 11/13/2011    Past Surgical History:  Procedure Laterality Date  . BREAST BIOPSY Right Sept, 2012  . CHOLECYSTECTOMY    . COLONOSCOPY    . ESOPHAGEAL DILATION N/A 02/05/2017   Procedure: ESOPHAGEAL DILATION;  Surgeon: Malissa Hippo, MD;  Location: [facility] ENDO SUITE;  Service: Endoscopy;  Laterality: N/A;  . ESOPHAGOGASTRODUODENOSCOPY N/A 02/05/2017   Procedure: ESOPHAGOGASTRODUODENOSCOPY (EGD);  Surgeon: Malissa Hippo, MD;  Location: [facility] ENDO SUITE;  Service: Endoscopy;  Laterality: N/A;  . UPPER GASTROINTESTINAL ENDOSCOPY      OB History    Gravida Para Term Preterm AB Living             0   SAB TAB Ectopic Multiple Live Births                   Home Medications  Prior to Admission medications   Medication Sig Start Date End Date Taking? Authorizing Provider  acetaminophen (TYLENOL) 325 MG tablet Take 2 tablets (650 mg total) by mouth every 6 (six) hours as needed for mild pain (or Fever >/= 101). 01/07/17   Kari Baars, MD  albuterol (PROVENTIL HFA;VENTOLIN HFA) 108 (90 Base) MCG/ACT inhaler Inhale 2 puffs into the lungs 4 (four) times daily.     [provider]  allopurinol (ZYLOPRIM) 300 MG tablet Take 0.5 tablets (150 mg total) by mouth daily. 02/07/17    Kari Baars, MD  ALPRAZolam Prudy Feeler) 0.25 MG tablet Take 1 tablet (0.25 mg total) by mouth 2 (two) times daily as needed for anxiety. 02/06/17 02/06/18  Kari Baars, MD  apixaban (ELIQUIS) 5 MG TABS tablet Take 1 tablet (5 mg total) by mouth 2 (two) times daily. 11/13/14   Kari Baars, MD  calcium carbonate (TUMS - DOSED IN MG ELEMENTAL CALCIUM) 500 MG chewable tablet Chew 1 tablet by mouth 2 (two) times daily.    [provider]  cetirizine (ZYRTEC) 10 MG tablet Take 10 mg by mouth daily.    [provider]  cholecalciferol (VITAMIN D) 1000 units tablet Take 1,000 Units by mouth every morning.    [provider]  colchicine 0.6 MG tablet Take 1 tablet (0.6 mg total) by mouth 2 (two) times daily. 02/06/17   Kari Baars, MD  diltiazem (CARDIZEM CD) 120 MG 24 hr capsule Take 1 capsule (120 mg total) by mouth daily. 01/26/16   Laqueta Linden, MD  Doxepin HCl (SILENOR) 6 MG TABS Take 1 tablet (6 mg total) by mouth at bedtime as needed. 02/06/17   Kari Baars, MD  feeding supplement, GLUCERNA SHAKE, (GLUCERNA SHAKE) LIQD Take 237 mLs by mouth 3 (three) times daily between meals. 01/07/17   Kari Baars, MD  fluticasone furoate-vilanterol (BREO ELLIPTA) 200-25 MCG/INH AEPB Inhale 1 puff into the lungs daily.    [provider]  gabapentin (NEURONTIN) 100 MG capsule Take 100 mg by mouth 2 (two) times daily.    [provider]  HYDROcodone-acetaminophen (NORCO) 7.5-325 MG tablet Take 1 tablet by mouth 3 (three) times daily as needed for moderate pain.    [provider]  insulin glargine (LANTUS) 100 UNIT/ML injection Inject 0.2 mLs (20 Units total) into the skin daily. 01/07/17   Kari Baars, MD  insulin lispro (HUMALOG) 100 UNIT/ML injection Inject 3-20 Units into the skin 3 (three) times daily before meals. 70-100= 0 units 101-150=3 units 151-200=4 units 201-250=8 units 251-300=12units 301-350=16units Greater than 350= 20  units    [provider]  levothyroxine (SYNTHROID, LEVOTHROID) 137 MCG tablet Take 137 mcg by mouth daily before breakfast.    [provider]  metoprolol tartrate (LOPRESSOR) 25 MG tablet TAKE 1/2 TABLET BY MOUTH TWICE DAILY 10/23/16   Laqueta Linden, MD  Multiple Vitamin (MULTIVITAMIN WITH MINERALS) TABS tablet Take 1 tablet by mouth daily.    [provider]  omeprazole (PRILOSEC) 20 MG capsule TAKE ONE CAPSULE BY MOUTH DAILY 05/29/16   Rehman, Joline Maxcy, MD  ondansetron (ZOFRAN) 4 MG tablet Take 1 tablet (4 mg total) by mouth every 6 (six) hours as needed for nausea. 01/07/17   Kari Baars, MD  polyvinyl alcohol (LIQUIFILM TEARS) 1.4 % ophthalmic solution Place 1 drop into both eyes 4 (four) times daily as needed for dry eyes. 01/07/17   Kari Baars, MD  potassium chloride SA (K-DUR,KLOR-CON) 20 MEQ tablet Take 1 tablet (20 mEq  total) by mouth daily. 12/10/16   Antoine Poche, MD  torsemide (DEMADEX) 20 MG tablet Take 1 tablet (20 mg total) by mouth 2 (two) times daily. (may take an extra 20mg  as needed) 01/07/17   Kari Baars, MD  triamcinolone (NASACORT ALLERGY 24HR) 55 MCG/ACT AERO nasal inhaler Place 2 sprays into the nose daily as needed (for allergies).     [provider]    Family History Family History  Problem Relation Age of Onset  . Dementia Mother   . Lung cancer Father        smoked  . Hypertension Sister   . Diabetes Brother   . Obesity Sister   . Hypertension Sister   . Restless legs syndrome Sister   . Healthy Sister   . Lung cancer Maternal Uncle     Social History Social History  Substance Use Topics  . Smoking status: Former Smoker    Packs/day: 0.50    Years: 30.00    Types: Cigarettes    Start date: 09/24/1958    Quit date: 11/13/1999  . Smokeless tobacco: Never Used  . Alcohol use No     Allergies   Ciprofloxacin; Doxycycline; Fish oil; Guaifenesin; Mucinex [guaifenesin er]; Sulfamethoxazole  w/trimethoprim 800-160 [sulfamethoxazole-trimethoprim]; Uloric [febuxostat]; Adhesive [tape]; Allopurinol; Cefuroxime axetil; Metolazone; and Tramadol   Review of Systems Review of Systems  Constitutional: Negative.  Negative for fever.  HENT: Negative.   Respiratory: Negative.   Cardiovascular: Negative.   Gastrointestinal: Positive for diarrhea and nausea. Negative for vomiting.  Musculoskeletal: Negative.   Skin: Positive for rash.       Chronic redness and bilateral shins which she calls "cellulitis" which she states is doing well at present  Allergic/Immunologic: Positive for immunocompromised state.       Diabetic  Neurological: Negative.   Psychiatric/Behavioral: Negative.   All other systems reviewed and are negative.    Physical Exam Updated Vital Signs BP (!) 120/56 (BP Location: Left Arm)   Pulse 79   Temp 98 F (36.7 C) (Oral)   Resp 18   Ht 5\' 1"  (1.549 m)   Wt 223 lb (101.2 kg)   SpO2 93%   BMI 42.14 kg/m   Physical Exam  Constitutional: She appears well-developed and well-nourished. No distress.  Chronically ill-appearing  HENT:  Head: Normocephalic and atraumatic.  Eyes: Conjunctivae are normal. Pupils are equal, round, and reactive to light.  Neck: Neck supple. No tracheal deviation present. No thyromegaly present.  Cardiovascular: Normal rate and regular rhythm.   No murmur heard. Pulmonary/Chest: Effort normal and breath sounds normal.  Abdominal: Soft. Bowel sounds are normal. She exhibits no distension. There is no tenderness.  Obese  Musculoskeletal: Normal range of motion. She exhibits no edema or tenderness.  Neurological: She is alert. Coordination normal.  Skin: Skin is warm and dry. Capillary refill takes less than 2 seconds. There is erythema.  Bilateral shins minimally reddened no swelling  Psychiatric: She has a normal mood and affect.  Nursing note and vitals reviewed.    ED Treatments / Results   DIAGNOSTIC STUDIES: Oxygen  Saturation is 93% on RA, adequate by my interpretation.    COORDINATION OF CARE: 12:47 PM Discussed treatment plan with pt at bedside which includes labs and pt agreed to plan.    Labs (all labs ordered are listed, but only abnormal results are displayed) Labs Reviewed - No data to display  EKG  EKG Interpretation None     Lab work from 2 days  ago reviewed.Remarkable for hemoglobin 8.1. Potassium 3.2. Calcium 6.8 . Otherwise essentially within normal limits I don't feel that further lab work needed in light of 4 or5 episodes of diarrhea.  Radiology No results found.  Procedures Procedures (including critical care time) Results for orders placed or performed during the hospital encounter of 02/10/17  POC occult blood, ED  Result Value Ref Range   Fecal Occult Bld NEGATIVE NEGATIVE   Dg Chest 1 View  Result Date: 01/28/2017 CLINICAL DATA:  Acute onset of shortness of breath. Initial encounter. EXAM: CHEST 1 VIEW COMPARISON:  Chest radiograph performed 12/30/2016 FINDINGS: The lungs are well-aerated. Minimal bibasilar atelectasis is noted. There is no evidence of pleural effusion or pneumothorax. The cardiomediastinal silhouette is mildly enlarged. No acute osseous abnormalities are seen. IMPRESSION: Minimal bibasilar atelectasis noted.  Lungs otherwise clear. Electronically Signed   By: Roanna Raider M.D.   On: 01/28/2017 19:36   Ct Head Wo Contrast  Result Date: 01/28/2017 CLINICAL DATA:  Weakness.  Nausea, loose stools and diarrhea. EXAM: CT HEAD WITHOUT CONTRAST TECHNIQUE: Contiguous axial images were obtained from the base of the skull through the vertex without intravenous contrast. COMPARISON:  Head CT 12/31/2016 FINDINGS: Brain: No evidence of acute infarction, hemorrhage, hydrocephalus, extra-axial collection or mass lesion/mass effect. Mild chronic small vessel ischemia is stable from prior exam. Vascular: Atherosclerosis of skullbase vasculature without hyperdense vessel or  abnormal calcification. Skull: Normal. Negative for fracture or focal lesion. Sinuses/Orbits: Paranasal sinuses and mastoid air cells are clear. The visualized orbits are unchanged with mild bilateral exophthalmos. Other: None. IMPRESSION: No acute intracranial abnormality. Electronically Signed   By: Rubye Oaks M.D.   On: 01/28/2017 19:51   Ct Abdomen Pelvis W Contrast  Result Date: 01/28/2017 CLINICAL DATA:  Shortness of breath, weakness, nausea and diarrhea EXAM: CT ABDOMEN AND PELVIS WITH CONTRAST TECHNIQUE: Multidetector CT imaging of the abdomen and pelvis was performed using the standard protocol following bolus administration of intravenous contrast. CONTRAST:  75mL ISOVUE-300 IOPAMIDOL (ISOVUE-300) INJECTION 61% COMPARISON:  02/02/2015 FINDINGS: Lower chest: Mild cardiomegaly. No pericardial or pleural effusion. Bibasilar atelectasis. Atherosclerosis of the descending thoracic aorta. Hepatobiliary: Mild hypoattenuation of the liver parenchyma compatible with hepatic steatosis or fatty infiltration. No focal hepatic abnormality or biliary dilatation. Patent hepatic and portal veins. Remote cholecystectomy noted. Pancreas: Unremarkable. No pancreatic ductal dilatation or surrounding inflammatory changes. Spleen: Normal in size without focal abnormality. Adrenals/Urinary Tract: Adrenal glands are unremarkable. Kidneys are normal, without renal calculi, focal lesion, or hydronephrosis. Bladder is unremarkable. Stomach/Bowel: Negative for bowel obstruction, significant dilatation, ileus, or free air. No fluid collection or abscess. Remote appendectomy noted. Colon is collapsed. Minor scattered diverticulosis. Vascular/Lymphatic: Aortic atherosclerosis noted without aneurysm or dissection. No occlusive process. No retroperitoneal hemorrhage. No adenopathy. Reproductive: Uterus and bilateral adnexa are unremarkable. Other: No abdominal wall hernia or abnormality. No abdominopelvic ascites.  Musculoskeletal: Diffuse lumbar spondylosis and facet arthropathy. No acute osseous finding or compression fracture. IMPRESSION: No acute intra-abdominal or pelvic finding by CT. Remote cholecystectomy and appendectomy. Hepatic steatosis Abdominal atherosclerosis Diverticulosis. Electronically Signed   By: Judie Petit.  Shick M.D.   On: 01/28/2017 19:57   Dg Esophagus  Result Date: 01/31/2017 CLINICAL DATA:  Dysphagia EXAM: ESOPHOGRAM / BARIUM SWALLOW / BARIUM TABLET STUDY TECHNIQUE: Combined double contrast and single contrast examination performed using effervescent crystals, thick barium liquid, and thin barium liquid. The patient was observed with fluoroscopy swallowing a 13 mm barium sulphate tablet. Study performed with head of bed elevated 30-45 degrees during  the exam. Due to aspiration, procedure was terminated prematurely. FLUOROSCOPY TIME:  Fluoroscopy Time:  2 minutes 42 seconds Radiation Exposure Index (if provided by the fluoroscopic device): 38.2 mGy Number of Acquired Spot Images: multiple fluoroscopic screen captures COMPARISON:  None FINDINGS: Esophageal distention: Question mall narrowing at the gastroesophageal junction. Remainder of esophagus appears to distend normally. Filling defects:  None 12.5 mm barium tablet: Did not pass beyond the distal esophagus and the stomach, though uncertain if this is due to obstruction or severe dysmotility. Multiple swallows of water or unable to pass the pill into the stomach. Motility:  Diffusely impaired Mucosa: On limited assessment walls appeared smooth without irregularity or ulceration. Hypopharynx/cervical esophagus: Dedicated AP and lateral views of the hypopharynx and cervical esophagus during swallowing were not obtained due to early termination of the procedure. Gross laryngeal penetration and aspiration of contrast were identified during the initial swallows of thick barium. Contrast was seen traveling down the RIGHT lateral wall of the trachea into the  RIGHT mainstem bronchus with the patient elevated to 45 degrees. No spontaneous cough. Hiatal hernia:  Not identified GE reflux:  Not identified during the period of imaging. Other:  N/A IMPRESSION: Gross laryngeal penetration and aspiration of contrast with barium extending down the RIGHT lateral wall of the trachea into the RIGHT mainstem bronchus without identification of a spontaneous cough reflex. This necessitated early termination of the procedure. 12.5 mm diameter barium tablet passed to the distal esophagus but did not pass into the stomach, potentially due to narrowing of the distal esophagus at the gastroesophageal junction though cannot completely exclude a component of esophageal dysmotility. Electronically Signed   By: Ulyses Southward M.D.   On: 01/31/2017 15:38   Dg Abd Acute W/chest  Result Date: 02/10/2017 CLINICAL DATA:  Diarrhea, nausea EXAM: DG ABDOMEN ACUTE W/ 1V CHEST COMPARISON:  CT abdomen/pelvis dated 01/28/2017 FINDINGS: Lungs are clear.  No pleural effusion or pneumothorax. The heart is normal in size. Nonobstructive bowel gas pattern. No evidence of free air under the diaphragm on the upright view. Cholecystectomy clips. Degenerative changes of the lumbar spine. Visualized bony pelvis appears intact. IMPRESSION: No evidence of acute cardiopulmonary disease. No evidence of small bowel obstruction or free air. Electronically Signed   By: Charline Bills M.D.   On: 02/10/2017 13:44   Dg Swallowing Func-speech Pathology  Result Date: 02/01/2017 Objective Swallowing Evaluation: Type of Study: MBS-Modified Barium Swallow Study Patient Details Name: FLORINE SPRENKLE MRN: [id number] Date of Birth: 02/28/1941 Today's Date: 02/01/2017 Time: SLP Start Time (ACUTE ONLY): 1408-SLP Stop Time (ACUTE ONLY): 1438 SLP Time Calculation (min) (ACUTE ONLY): 30 min Past Medical History: Past Medical History: Diagnosis Date . Barrett esophagus  . Bronchitis  . Cellulitis  . COPD (chronic obstructive pulmonary  disease) (HCC)  . Diastolic heart failure (HCC)  . Essential hypertension  . GERD (gastroesophageal reflux disease)  . Hepatomegaly   ??? . Hypothyroidism  . PAF (paroxysmal atrial fibrillation) (HCC)   Eliquis . Stage III chronic kidney disease  Past Surgical History: Past Surgical History: Procedure Laterality Date . BREAST BIOPSY Right Sept, 2012 . CHOLECYSTECTOMY   . COLONOSCOPY   . UPPER GASTROINTESTINAL ENDOSCOPY   HPI: 76 y.o. female with medical history significant of Afib on Eliquis, Stage 3 CKD, hypothyroidism, GERD, HTN, diastolic heart failure, and COPD. Dx: Hyponatremia, and L LE cellulitis. Pt had BPE yesterday and experienced silent laryngeal penetration and aspiration with liquids during the study. SLP consult received and MBSS ordered due to  known aspiration on study yesterday. Subjective: "I have had trouble with pills lately." Assessment / Plan / Recommendation CHL IP CLINICAL IMPRESSIONS 02/01/2017 Clinical Impression Mild oral phase dysphagia characterized by delayed oral transit with puree and barium tablet and pharyngeal phase essentially WFL with trace penetration of thins noted with sequential straw sips. No aspiration noted and no significant residuals. Esophageal sweep completed several times throughout the examination did reveal barium filled esophagus suggestive of delayed emptying and dysmotility. Barium tablet remained near GE junction despite puree and liquid wash. Esophagus never completely cleared. Pt did demonstrate silent aspiration of barium contrast yesterday during BPE, however Pt was in semi-reclined position so this likely negatively impacted pharyngeal phase. Recommend GI f/u (already spoke with Dr. Karilyn Cota who may dilate Pt on Monday). D3/mech soft and thin liquids with aspiration and reflux precautions. Reflux precautions reviewed extensively with Pt. No further SLP f/u indicated at this time. SLP Visit Diagnosis Dysphagia, pharyngoesophageal phase (R13.14) Attention and  concentration deficit following -- Frontal lobe and executive function deficit following -- Impact on safety and function Mild aspiration risk   CHL IP TREATMENT RECOMMENDATION 02/01/2017 Treatment Recommendations No treatment recommended at this time   Prognosis 02/01/2017 Prognosis for Safe Diet Advancement Good Barriers to Reach Goals -- Barriers/Prognosis Comment -- CHL IP DIET RECOMMENDATION 02/01/2017 SLP Diet Recommendations Dysphagia 3 (Mech soft) solids;Thin liquid Liquid Administration via Cup;Straw Medication Administration Whole meds with liquid Compensations Multiple dry swallows after each bite/sip Postural Changes Remain semi-upright after after feeds/meals (Comment);Seated upright at 90 degrees   CHL IP OTHER RECOMMENDATIONS 02/01/2017 Recommended Consults Consider GI evaluation;Consider esophageal assessment Oral Care Recommendations Oral care BID;Patient independent with oral care Other Recommendations Clarify dietary restrictions   CHL IP FOLLOW UP RECOMMENDATIONS 02/01/2017 Follow up Recommendations None   No flowsheet data found.     CHL IP ORAL PHASE 02/01/2017 Oral Phase Impaired Oral - Pudding Teaspoon -- Oral - Pudding Cup -- Oral - Honey Teaspoon -- Oral - Honey Cup -- Oral - Nectar Teaspoon -- Oral - Nectar Cup -- Oral - Nectar Straw -- Oral - Thin Teaspoon -- Oral - Thin Cup -- Oral - Thin Straw -- Oral - Puree Lingual pumping;Reduced posterior propulsion;Delayed oral transit Oral - Mech Soft -- Oral - Regular -- Oral - Multi-Consistency -- Oral - Pill Delayed oral transit Oral Phase - Comment --  CHL IP PHARYNGEAL PHASE 02/01/2017 Pharyngeal Phase Impaired Pharyngeal- Pudding Teaspoon -- Pharyngeal -- Pharyngeal- Pudding Cup -- Pharyngeal -- Pharyngeal- Honey Teaspoon -- Pharyngeal -- Pharyngeal- Honey Cup -- Pharyngeal -- Pharyngeal- Nectar Teaspoon -- Pharyngeal -- Pharyngeal- Nectar Cup -- Pharyngeal -- Pharyngeal- Nectar Straw -- Pharyngeal -- Pharyngeal- Thin Teaspoon -- Pharyngeal --  Pharyngeal- Thin Cup WFL Pharyngeal -- Pharyngeal- Thin Straw Delayed swallow initiation-pyriform sinuses;Delayed swallow initiation-vallecula;Penetration/Aspiration during swallow Pharyngeal Material does not enter airway;Material enters airway, remains ABOVE vocal cords then ejected out Pharyngeal- Puree WFL Pharyngeal -- Pharyngeal- Mechanical Soft -- Pharyngeal -- Pharyngeal- Regular WFL Pharyngeal -- Pharyngeal- Multi-consistency -- Pharyngeal -- Pharyngeal- Pill WFL Pharyngeal -- Pharyngeal Comment --  CHL IP CERVICAL ESOPHAGEAL PHASE 02/01/2017 Cervical Esophageal Phase Impaired Pudding Teaspoon -- Pudding Cup -- Honey Teaspoon -- Honey Cup -- Nectar Teaspoon -- Nectar Cup -- Nectar Straw -- Thin Teaspoon -- Thin Cup -- Thin Straw -- Puree Esophageal backflow into cervical esophagus Mechanical Soft -- Regular -- Multi-consistency -- Pill -- Cervical Esophageal Comment -- Thank you, Havery Moros, CCC-SLP [phone] No flowsheet data found. PORTER,DABNEY 02/01/2017, 3:11 PM  Medications Ordered in ED Medications - No data to display  X-rays viewed by me Results for orders placed or performed during the hospital encounter of 02/10/17  POC occult blood, ED  Result Value Ref Range   Fecal Occult Bld NEGATIVE NEGATIVE   Dg Chest 1 View  Result Date: 01/28/2017 CLINICAL DATA:  Acute onset of shortness of breath. Initial encounter. EXAM: CHEST 1 VIEW COMPARISON:  Chest radiograph performed 12/30/2016 FINDINGS: The lungs are well-aerated. Minimal bibasilar atelectasis is noted. There is no evidence of pleural effusion or pneumothorax. The cardiomediastinal silhouette is mildly enlarged. No acute osseous abnormalities are seen. IMPRESSION: Minimal bibasilar atelectasis noted.  Lungs otherwise clear. Electronically Signed   By: Roanna Raider M.D.   On: 01/28/2017 19:36   Ct Head Wo Contrast  Result Date: 01/28/2017 CLINICAL DATA:  Weakness.  Nausea, loose stools and diarrhea. EXAM: CT HEAD  WITHOUT CONTRAST TECHNIQUE: Contiguous axial images were obtained from the base of the skull through the vertex without intravenous contrast. COMPARISON:  Head CT 12/31/2016 FINDINGS: Brain: No evidence of acute infarction, hemorrhage, hydrocephalus, extra-axial collection or mass lesion/mass effect. Mild chronic small vessel ischemia is stable from prior exam. Vascular: Atherosclerosis of skullbase vasculature without hyperdense vessel or abnormal calcification. Skull: Normal. Negative for fracture or focal lesion. Sinuses/Orbits: Paranasal sinuses and mastoid air cells are clear. The visualized orbits are unchanged with mild bilateral exophthalmos. Other: None. IMPRESSION: No acute intracranial abnormality. Electronically Signed   By: Rubye Oaks M.D.   On: 01/28/2017 19:51   Ct Abdomen Pelvis W Contrast  Result Date: 01/28/2017 CLINICAL DATA:  Shortness of breath, weakness, nausea and diarrhea EXAM: CT ABDOMEN AND PELVIS WITH CONTRAST TECHNIQUE: Multidetector CT imaging of the abdomen and pelvis was performed using the standard protocol following bolus administration of intravenous contrast. CONTRAST:  75mL ISOVUE-300 IOPAMIDOL (ISOVUE-300) INJECTION 61% COMPARISON:  02/02/2015 FINDINGS: Lower chest: Mild cardiomegaly. No pericardial or pleural effusion. Bibasilar atelectasis. Atherosclerosis of the descending thoracic aorta. Hepatobiliary: Mild hypoattenuation of the liver parenchyma compatible with hepatic steatosis or fatty infiltration. No focal hepatic abnormality or biliary dilatation. Patent hepatic and portal veins. Remote cholecystectomy noted. Pancreas: Unremarkable. No pancreatic ductal dilatation or surrounding inflammatory changes. Spleen: Normal in size without focal abnormality. Adrenals/Urinary Tract: Adrenal glands are unremarkable. Kidneys are normal, without renal calculi, focal lesion, or hydronephrosis. Bladder is unremarkable. Stomach/Bowel: Negative for bowel obstruction,  significant dilatation, ileus, or free air. No fluid collection or abscess. Remote appendectomy noted. Colon is collapsed. Minor scattered diverticulosis. Vascular/Lymphatic: Aortic atherosclerosis noted without aneurysm or dissection. No occlusive process. No retroperitoneal hemorrhage. No adenopathy. Reproductive: Uterus and bilateral adnexa are unremarkable. Other: No abdominal wall hernia or abnormality. No abdominopelvic ascites. Musculoskeletal: Diffuse lumbar spondylosis and facet arthropathy. No acute osseous finding or compression fracture. IMPRESSION: No acute intra-abdominal or pelvic finding by CT. Remote cholecystectomy and appendectomy. Hepatic steatosis Abdominal atherosclerosis Diverticulosis. Electronically Signed   By: Judie Petit.  Shick M.D.   On: 01/28/2017 19:57   Dg Esophagus  Result Date: 01/31/2017 CLINICAL DATA:  Dysphagia EXAM: ESOPHOGRAM / BARIUM SWALLOW / BARIUM TABLET STUDY TECHNIQUE: Combined double contrast and single contrast examination performed using effervescent crystals, thick barium liquid, and thin barium liquid. The patient was observed with fluoroscopy swallowing a 13 mm barium sulphate tablet. Study performed with head of bed elevated 30-45 degrees during the exam. Due to aspiration, procedure was terminated prematurely. FLUOROSCOPY TIME:  Fluoroscopy Time:  2 minutes 42 seconds Radiation Exposure Index (if provided by the fluoroscopic device):  38.2 mGy Number of Acquired Spot Images: multiple fluoroscopic screen captures COMPARISON:  None FINDINGS: Esophageal distention: Question mall narrowing at the gastroesophageal junction. Remainder of esophagus appears to distend normally. Filling defects:  None 12.5 mm barium tablet: Did not pass beyond the distal esophagus and the stomach, though uncertain if this is due to obstruction or severe dysmotility. Multiple swallows of water or unable to pass the pill into the stomach. Motility:  Diffusely impaired Mucosa: On limited assessment  walls appeared smooth without irregularity or ulceration. Hypopharynx/cervical esophagus: Dedicated AP and lateral views of the hypopharynx and cervical esophagus during swallowing were not obtained due to early termination of the procedure. Gross laryngeal penetration and aspiration of contrast were identified during the initial swallows of thick barium. Contrast was seen traveling down the RIGHT lateral wall of the trachea into the RIGHT mainstem bronchus with the patient elevated to 45 degrees. No spontaneous cough. Hiatal hernia:  Not identified GE reflux:  Not identified during the period of imaging. Other:  N/A IMPRESSION: Gross laryngeal penetration and aspiration of contrast with barium extending down the RIGHT lateral wall of the trachea into the RIGHT mainstem bronchus without identification of a spontaneous cough reflex. This necessitated early termination of the procedure. 12.5 mm diameter barium tablet passed to the distal esophagus but did not pass into the stomach, potentially due to narrowing of the distal esophagus at the gastroesophageal junction though cannot completely exclude a component of esophageal dysmotility. Electronically Signed   By: Ulyses Southward M.D.   On: 01/31/2017 15:38   Dg Abd Acute W/chest  Result Date: 02/10/2017 CLINICAL DATA:  Diarrhea, nausea EXAM: DG ABDOMEN ACUTE W/ 1V CHEST COMPARISON:  CT abdomen/pelvis dated 01/28/2017 FINDINGS: Lungs are clear.  No pleural effusion or pneumothorax. The heart is normal in size. Nonobstructive bowel gas pattern. No evidence of free air under the diaphragm on the upright view. Cholecystectomy clips. Degenerative changes of the lumbar spine. Visualized bony pelvis appears intact. IMPRESSION: No evidence of acute cardiopulmonary disease. No evidence of small bowel obstruction or free air. Electronically Signed   By: Charline Bills M.D.   On: 02/10/2017 13:44   Dg Swallowing Func-speech Pathology  Result Date: 02/01/2017 Objective  Swallowing Evaluation: Type of Study: MBS-Modified Barium Swallow Study Patient Details Name: PHIL CORTI MRN: [id number] Date of Birth: 10-10-1940 Today's Date: 02/01/2017 Time: SLP Start Time (ACUTE ONLY): 1408-SLP Stop Time (ACUTE ONLY): 1438 SLP Time Calculation (min) (ACUTE ONLY): 30 min Past Medical History: Past Medical History: Diagnosis Date . Barrett esophagus  . Bronchitis  . Cellulitis  . COPD (chronic obstructive pulmonary disease) (HCC)  . Diastolic heart failure (HCC)  . Essential hypertension  . GERD (gastroesophageal reflux disease)  . Hepatomegaly   ??? . Hypothyroidism  . PAF (paroxysmal atrial fibrillation) (HCC)   Eliquis . Stage III chronic kidney disease  Past Surgical History: Past Surgical History: Procedure Laterality Date . BREAST BIOPSY Right Sept, 2012 . CHOLECYSTECTOMY   . COLONOSCOPY   . UPPER GASTROINTESTINAL ENDOSCOPY   HPI: 76 y.o. female with medical history significant of Afib on Eliquis, Stage 3 CKD, hypothyroidism, GERD, HTN, diastolic heart failure, and COPD. Dx: Hyponatremia, and L LE cellulitis. Pt had BPE yesterday and experienced silent laryngeal penetration and aspiration with liquids during the study. SLP consult received and MBSS ordered due to known aspiration on study yesterday. Subjective: "I have had trouble with pills lately." Assessment / Plan / Recommendation CHL IP CLINICAL IMPRESSIONS 02/01/2017 Clinical Impression Mild oral phase  dysphagia characterized by delayed oral transit with puree and barium tablet and pharyngeal phase essentially WFL with trace penetration of thins noted with sequential straw sips. No aspiration noted and no significant residuals. Esophageal sweep completed several times throughout the examination did reveal barium filled esophagus suggestive of delayed emptying and dysmotility. Barium tablet remained near GE junction despite puree and liquid wash. Esophagus never completely cleared. Pt did demonstrate silent aspiration of barium  contrast yesterday during BPE, however Pt was in semi-reclined position so this likely negatively impacted pharyngeal phase. Recommend GI f/u (already spoke with Dr. Karilyn Cotaehman who may dilate Pt on Monday). D3/mech soft and thin liquids with aspiration and reflux precautions. Reflux precautions reviewed extensively with Pt. No further SLP f/u indicated at this time. SLP Visit Diagnosis Dysphagia, pharyngoesophageal phase (R13.14) Attention and concentration deficit following -- Frontal lobe and executive function deficit following -- Impact on safety and function Mild aspiration risk   CHL IP TREATMENT RECOMMENDATION 02/01/2017 Treatment Recommendations No treatment recommended at this time   Prognosis 02/01/2017 Prognosis for Safe Diet Advancement Good Barriers to Reach Goals -- Barriers/Prognosis Comment -- CHL IP DIET RECOMMENDATION 02/01/2017 SLP Diet Recommendations Dysphagia 3 (Mech soft) solids;Thin liquid Liquid Administration via Cup;Straw Medication Administration Whole meds with liquid Compensations Multiple dry swallows after each bite/sip Postural Changes Remain semi-upright after after feeds/meals (Comment);Seated upright at 90 degrees   CHL IP OTHER RECOMMENDATIONS 02/01/2017 Recommended Consults Consider GI evaluation;Consider esophageal assessment Oral Care Recommendations Oral care BID;Patient independent with oral care Other Recommendations Clarify dietary restrictions   CHL IP FOLLOW UP RECOMMENDATIONS 02/01/2017 Follow up Recommendations None   No flowsheet data found.     CHL IP ORAL PHASE 02/01/2017 Oral Phase Impaired Oral - Pudding Teaspoon -- Oral - Pudding Cup -- Oral - Honey Teaspoon -- Oral - Honey Cup -- Oral - Nectar Teaspoon -- Oral - Nectar Cup -- Oral - Nectar Straw -- Oral - Thin Teaspoon -- Oral - Thin Cup -- Oral - Thin Straw -- Oral - Puree Lingual pumping;Reduced posterior propulsion;Delayed oral transit Oral - Mech Soft -- Oral - Regular -- Oral - Multi-Consistency -- Oral - Pill  Delayed oral transit Oral Phase - Comment --  CHL IP PHARYNGEAL PHASE 02/01/2017 Pharyngeal Phase Impaired Pharyngeal- Pudding Teaspoon -- Pharyngeal -- Pharyngeal- Pudding Cup -- Pharyngeal -- Pharyngeal- Honey Teaspoon -- Pharyngeal -- Pharyngeal- Honey Cup -- Pharyngeal -- Pharyngeal- Nectar Teaspoon -- Pharyngeal -- Pharyngeal- Nectar Cup -- Pharyngeal -- Pharyngeal- Nectar Straw -- Pharyngeal -- Pharyngeal- Thin Teaspoon -- Pharyngeal -- Pharyngeal- Thin Cup WFL Pharyngeal -- Pharyngeal- Thin Straw Delayed swallow initiation-pyriform sinuses;Delayed swallow initiation-vallecula;Penetration/Aspiration during swallow Pharyngeal Material does not enter airway;Material enters airway, remains ABOVE vocal cords then ejected out Pharyngeal- Puree WFL Pharyngeal -- Pharyngeal- Mechanical Soft -- Pharyngeal -- Pharyngeal- Regular WFL Pharyngeal -- Pharyngeal- Multi-consistency -- Pharyngeal -- Pharyngeal- Pill WFL Pharyngeal -- Pharyngeal Comment --  CHL IP CERVICAL ESOPHAGEAL PHASE 02/01/2017 Cervical Esophageal Phase Impaired Pudding Teaspoon -- Pudding Cup -- Honey Teaspoon -- Honey Cup -- Nectar Teaspoon -- Nectar Cup -- Nectar Straw -- Thin Teaspoon -- Thin Cup -- Thin Straw -- Puree Esophageal backflow into cervical esophagus Mechanical Soft -- Regular -- Multi-consistency -- Pill -- Cervical Esophageal Comment -- Thank you, Havery MorosDabney Porter, CCC-SLP [phone] No flowsheet data found. PORTER,DABNEY 02/01/2017, 3:11 PM              Initial Impression / Assessment and Plan / ED Course  I have reviewed the triage vital signs  and the nursing notes.  Pertinent labs & imaging results that were available during my care of the patient were reviewed by me and considered in my medical decision making (see chart for details).     Nausea much improved after treatment with intravenous hydration and intravenous Reglan. She is able to drink without difficulty. Receive potassium chloride 40 mEq by mouth prior to discharge.  Patient anemic but hemoglobin slightly improved  from 9 days ago. Stools Hemoccult negative.  encouraged hydration. Imodium for diarrhea. Prescription Reglan. Follow up with Dr. Juanetta Gosling this week Final Clinical Impressions(s) / ED Diagnoses  Diagnosis #1 diarrhea #2 mild dehydration 3 anemia #4 hypokalemia #5 hypocalcemia Final diagnoses:  None    New Prescriptions New Prescriptions   No medications on file         Doug Sou, MD 02/10/17 1457    Doug Sou, MD 02/10/17 [phone]

## 2017-02-10 NOTE — ED Notes (Signed)
Pt. Back from xray

## 2017-02-10 NOTE — ED Triage Notes (Signed)
Pt reports diarrhea and nausea since yesterday.  [facility]  Multiple admissions and rehab stays in the past few months.

## 2017-02-10 NOTE — ED Notes (Signed)
[facility]

## 2017-02-15 ENCOUNTER — Other Ambulatory Visit (HOSPITAL_COMMUNITY)
Admission: AD | Admit: 2017-02-15 | Discharge: 2017-02-15 | Disposition: A | Discharge status: Home / Self Care | Payer: Medicare Other | Source: Skilled Nursing Facility | Attending: Internal Medicine | Admitting: Internal Medicine

## 2017-02-15 DIAGNOSIS — E871 Hypo-osmolality and hyponatremia: Secondary | ICD-10-CM | POA: Insufficient documentation

## 2017-02-15 LAB — CBC
HEMATOCRIT: 28.9 % — AB (ref 36.0–46.0)
Hemoglobin: 9.5 g/dL — ABNORMAL LOW (ref 12.0–15.0)
MCH: 32.1 pg (ref 26.0–34.0)
MCHC: 32.9 g/dL (ref 30.0–36.0)
MCV: 97.6 fL (ref 78.0–100.0)
Platelets: 261 10*3/uL (ref 150–400)
RBC: 2.96 MIL/uL — AB (ref 3.87–5.11)
RDW: 18.6 % — ABNORMAL HIGH (ref 11.5–15.5)
WBC: 8.4 10*3/uL (ref 4.0–10.5)

## 2017-02-15 LAB — BASIC METABOLIC PANEL
ANION GAP: 10 (ref 5–15)
BUN: 12 mg/dL (ref 6–20)
CHLORIDE: 96 mmol/L — AB (ref 101–111)
CO2: 26 mmol/L (ref 22–32)
Calcium: 8.3 mg/dL — ABNORMAL LOW (ref 8.9–10.3)
Creatinine, Ser: 1.55 mg/dL — ABNORMAL HIGH (ref 0.44–1.00)
GFR calc non Af Amer: 32 mL/min — ABNORMAL LOW (ref >60)
GFR, EST AFRICAN AMERICAN: 37 mL/min — AB (ref >60)
Glucose, Bld: 124 mg/dL — ABNORMAL HIGH (ref 65–99)
POTASSIUM: 4.6 mmol/L (ref 3.5–5.1)
SODIUM: 132 mmol/L — AB (ref 135–145)

## 2017-02-17 ENCOUNTER — Other Ambulatory Visit (HOSPITAL_COMMUNITY)
Admission: RE | Admit: 2017-02-17 | Discharge: 2017-02-17 | Disposition: A | Discharge status: Home / Self Care | Payer: Medicare Other | Source: Skilled Nursing Facility | Attending: Pulmonary Disease | Admitting: Pulmonary Disease

## 2017-02-17 DIAGNOSIS — A0472 Enterocolitis due to Clostridium difficile, not specified as recurrent: Secondary | ICD-10-CM | POA: Diagnosis present

## 2017-02-17 LAB — C DIFFICILE QUICK SCREEN W PCR REFLEX
C DIFFICILE (CDIFF) TOXIN: NEGATIVE
C DIFFICLE (CDIFF) ANTIGEN: POSITIVE — AB

## 2017-02-17 LAB — CLOSTRIDIUM DIFFICILE BY PCR: CDIFFPCR: NEGATIVE

## 2017-02-22 ENCOUNTER — Other Ambulatory Visit (HOSPITAL_COMMUNITY)
Admission: RE | Admit: 2017-02-22 | Discharge: 2017-02-22 | Disposition: A | Discharge status: Home / Self Care | Payer: Medicare Other | Source: Skilled Nursing Facility | Attending: Pulmonary Disease | Admitting: Pulmonary Disease

## 2017-02-22 DIAGNOSIS — E871 Hypo-osmolality and hyponatremia: Secondary | ICD-10-CM | POA: Insufficient documentation

## 2017-02-22 LAB — BASIC METABOLIC PANEL
ANION GAP: 8 (ref 5–15)
BUN: 23 mg/dL — AB (ref 6–20)
CHLORIDE: 97 mmol/L — AB (ref 101–111)
CO2: 22 mmol/L (ref 22–32)
Calcium: 8.5 mg/dL — ABNORMAL LOW (ref 8.9–10.3)
Creatinine, Ser: 2.19 mg/dL — ABNORMAL HIGH (ref 0.44–1.00)
GFR calc Af Amer: 24 mL/min — ABNORMAL LOW (ref >60)
GFR calc non Af Amer: 21 mL/min — ABNORMAL LOW (ref >60)
GLUCOSE: 84 mg/dL (ref 65–99)
POTASSIUM: 6.2 mmol/L — AB (ref 3.5–5.1)
Sodium: 127 mmol/L — ABNORMAL LOW (ref 135–145)

## 2017-02-22 LAB — CBC
HEMATOCRIT: 29.2 % — AB (ref 36.0–46.0)
HEMOGLOBIN: 9.8 g/dL — AB (ref 12.0–15.0)
MCH: 32.7 pg (ref 26.0–34.0)
MCHC: 33.6 g/dL (ref 30.0–36.0)
MCV: 97.3 fL (ref 78.0–100.0)
Platelets: 251 10*3/uL (ref 150–400)
RBC: 3 MIL/uL — AB (ref 3.87–5.11)
RDW: 17.6 % — ABNORMAL HIGH (ref 11.5–15.5)
WBC: 7.8 10*3/uL (ref 4.0–10.5)

## 2017-02-26 ENCOUNTER — Encounter (HOSPITAL_COMMUNITY)
Admission: RE | Admit: 2017-02-26 | Discharge: 2017-02-26 | Disposition: A | Discharge status: Home / Self Care | Payer: Medicare Other | Source: Skilled Nursing Facility | Attending: Pulmonary Disease | Admitting: Pulmonary Disease

## 2017-02-26 DIAGNOSIS — E871 Hypo-osmolality and hyponatremia: Secondary | ICD-10-CM | POA: Insufficient documentation

## 2017-02-26 LAB — BASIC METABOLIC PANEL
Anion gap: 9 (ref 5–15)
BUN: 26 mg/dL — AB (ref 6–20)
CHLORIDE: 97 mmol/L — AB (ref 101–111)
CO2: 22 mmol/L (ref 22–32)
CREATININE: 2.17 mg/dL — AB (ref 0.44–1.00)
Calcium: 8.9 mg/dL (ref 8.9–10.3)
GFR calc Af Amer: 25 mL/min — ABNORMAL LOW (ref >60)
GFR calc non Af Amer: 21 mL/min — ABNORMAL LOW (ref >60)
GLUCOSE: 106 mg/dL — AB (ref 65–99)
POTASSIUM: 5.7 mmol/L — AB (ref 3.5–5.1)
Sodium: 128 mmol/L — ABNORMAL LOW (ref 135–145)

## 2017-02-28 ENCOUNTER — Encounter (HOSPITAL_COMMUNITY)
Admission: RE | Admit: 2017-02-28 | Discharge: 2017-02-28 | Disposition: A | Discharge status: Home / Self Care | Payer: Medicare Other | Source: Skilled Nursing Facility | Attending: Pulmonary Disease | Admitting: Pulmonary Disease

## 2017-02-28 DIAGNOSIS — I5032 Chronic diastolic (congestive) heart failure: Secondary | ICD-10-CM | POA: Insufficient documentation

## 2017-02-28 DIAGNOSIS — A0472 Enterocolitis due to Clostridium difficile, not specified as recurrent: Secondary | ICD-10-CM | POA: Insufficient documentation

## 2017-02-28 DIAGNOSIS — J449 Chronic obstructive pulmonary disease, unspecified: Secondary | ICD-10-CM | POA: Insufficient documentation

## 2017-02-28 DIAGNOSIS — E871 Hypo-osmolality and hyponatremia: Secondary | ICD-10-CM | POA: Insufficient documentation

## 2017-02-28 LAB — BASIC METABOLIC PANEL
Anion gap: 8 (ref 5–15)
BUN: 29 mg/dL — AB (ref 6–20)
CALCIUM: 8.6 mg/dL — AB (ref 8.9–10.3)
CO2: 24 mmol/L (ref 22–32)
CREATININE: 1.87 mg/dL — AB (ref 0.44–1.00)
Chloride: 98 mmol/L — ABNORMAL LOW (ref 101–111)
GFR, EST AFRICAN AMERICAN: 30 mL/min — AB (ref >60)
GFR, EST NON AFRICAN AMERICAN: 26 mL/min — AB (ref >60)
Glucose, Bld: 90 mg/dL (ref 65–99)
Potassium: 4.6 mmol/L (ref 3.5–5.1)
SODIUM: 130 mmol/L — AB (ref 135–145)

## 2017-03-01 ENCOUNTER — Encounter (HOSPITAL_COMMUNITY)
Admission: RE | Admit: 2017-03-01 | Discharge: 2017-03-01 | Disposition: A | Discharge status: Home / Self Care | Payer: Medicare Other | Source: Skilled Nursing Facility | Attending: Pulmonary Disease | Admitting: Pulmonary Disease

## 2017-03-01 LAB — BASIC METABOLIC PANEL
ANION GAP: 10 (ref 5–15)
BUN: 32 mg/dL — ABNORMAL HIGH (ref 6–20)
CO2: 22 mmol/L (ref 22–32)
Calcium: 8.8 mg/dL — ABNORMAL LOW (ref 8.9–10.3)
Chloride: 98 mmol/L — ABNORMAL LOW (ref 101–111)
Creatinine, Ser: 2.35 mg/dL — ABNORMAL HIGH (ref 0.44–1.00)
GFR calc Af Amer: 22 mL/min — ABNORMAL LOW (ref >60)
GFR, EST NON AFRICAN AMERICAN: 19 mL/min — AB (ref >60)
GLUCOSE: 89 mg/dL (ref 65–99)
POTASSIUM: 4.8 mmol/L (ref 3.5–5.1)
Sodium: 130 mmol/L — ABNORMAL LOW (ref 135–145)

## 2017-03-01 LAB — CBC
HCT: 32.1 % — ABNORMAL LOW (ref 36.0–46.0)
HEMOGLOBIN: 10.4 g/dL — AB (ref 12.0–15.0)
MCH: 32 pg (ref 26.0–34.0)
MCHC: 32.4 g/dL (ref 30.0–36.0)
MCV: 98.8 fL (ref 78.0–100.0)
Platelets: 316 10*3/uL (ref 150–400)
RBC: 3.25 MIL/uL — AB (ref 3.87–5.11)
RDW: 16.9 % — ABNORMAL HIGH (ref 11.5–15.5)
WBC: 9.2 10*3/uL (ref 4.0–10.5)

## 2017-03-02 NOTE — Progress Notes (Signed)
This is documentation of my visit at the skilled care facility on 03/02/2017  Subjective: She says she feels better. She's eating better. She had an episode of hyperkalemia and I think she was dehydrated. Her potassium has been discontinued and her diuretics have been discontinued. She says she's doing better with therapy. She still has poor appetite but she is eating more. She is short of breath with exertion but that's not as bad. She has not had any chest pain. Renal function has improved and will need to be continued to be monitored  I checked her MAR and find her medications to be appropriate. I decreased her colchicine to 1 daily.  Objective: Constitutional: She is awake and alert and looks much better. Ears nose mouth and throat: Her mucous membranes are moist her throat is clear. Cardiovascular: Her heart is regular with normal heart sounds. She has trace edema of the extremities. Respiratory: Her respiratory effort is normal and her lungs are clear. Gastrointestinal: Her abdomen is soft with no masses. Psychiatric: Normal mood and affect neurological: No focal abnormalities  Assessment: She has heart failure which is better. She got dehydrated and I'm holding her meds for now. Her potassium was up but is back to normal. She has chronic renal failure which has stabilized. She has COPD which is doing okay. She has weakness and deconditioning and she is working with physical therapy now. She's had trouble with depression and I think that's better  Plan: Continue treatments. Overall she is the best I've seen her in several months

## 2017-03-12 ENCOUNTER — Encounter (HOSPITAL_COMMUNITY)
Admission: RE | Admit: 2017-03-12 | Discharge: 2017-03-12 | Disposition: A | Discharge status: Home / Self Care | Payer: Medicare Other | Source: Skilled Nursing Facility | Attending: Pulmonary Disease | Admitting: Pulmonary Disease

## 2017-03-12 ENCOUNTER — Encounter (HOSPITAL_COMMUNITY): Payer: Self-pay

## 2017-03-12 ENCOUNTER — Inpatient Hospital Stay (HOSPITAL_COMMUNITY)
Admission: EM | Admit: 2017-03-12 | Discharge: 2017-03-20 | DRG: 372 | Disposition: A | Discharge status: Skilled Nursing Facility | Payer: Medicare Other | Attending: Pulmonary Disease | Admitting: Pulmonary Disease

## 2017-03-12 DIAGNOSIS — M109 Gout, unspecified: Secondary | ICD-10-CM | POA: Diagnosis present

## 2017-03-12 DIAGNOSIS — E860 Dehydration: Secondary | ICD-10-CM | POA: Diagnosis present

## 2017-03-12 DIAGNOSIS — I130 Hypertensive heart and chronic kidney disease with heart failure and stage 1 through stage 4 chronic kidney disease, or unspecified chronic kidney disease: Secondary | ICD-10-CM | POA: Diagnosis present

## 2017-03-12 DIAGNOSIS — N183 Chronic kidney disease, stage 3 (moderate): Secondary | ICD-10-CM | POA: Diagnosis present

## 2017-03-12 DIAGNOSIS — D649 Anemia, unspecified: Secondary | ICD-10-CM | POA: Diagnosis not present

## 2017-03-12 DIAGNOSIS — J449 Chronic obstructive pulmonary disease, unspecified: Secondary | ICD-10-CM | POA: Diagnosis not present

## 2017-03-12 DIAGNOSIS — E669 Obesity, unspecified: Secondary | ICD-10-CM | POA: Diagnosis present

## 2017-03-12 DIAGNOSIS — Z881 Allergy status to other antibiotic agents status: Secondary | ICD-10-CM

## 2017-03-12 DIAGNOSIS — Z833 Family history of diabetes mellitus: Secondary | ICD-10-CM

## 2017-03-12 DIAGNOSIS — N189 Chronic kidney disease, unspecified: Secondary | ICD-10-CM

## 2017-03-12 DIAGNOSIS — G9340 Encephalopathy, unspecified: Secondary | ICD-10-CM | POA: Diagnosis not present

## 2017-03-12 DIAGNOSIS — E8809 Other disorders of plasma-protein metabolism, not elsewhere classified: Secondary | ICD-10-CM | POA: Diagnosis not present

## 2017-03-12 DIAGNOSIS — Z6836 Body mass index (BMI) 36.0-36.9, adult: Secondary | ICD-10-CM | POA: Diagnosis not present

## 2017-03-12 DIAGNOSIS — E876 Hypokalemia: Secondary | ICD-10-CM | POA: Diagnosis not present

## 2017-03-12 DIAGNOSIS — Z79899 Other long term (current) drug therapy: Secondary | ICD-10-CM

## 2017-03-12 DIAGNOSIS — R197 Diarrhea, unspecified: Secondary | ICD-10-CM | POA: Diagnosis present

## 2017-03-12 DIAGNOSIS — F329 Major depressive disorder, single episode, unspecified: Secondary | ICD-10-CM | POA: Diagnosis present

## 2017-03-12 DIAGNOSIS — Z794 Long term (current) use of insulin: Secondary | ICD-10-CM

## 2017-03-12 DIAGNOSIS — A09 Infectious gastroenteritis and colitis, unspecified: Secondary | ICD-10-CM | POA: Diagnosis not present

## 2017-03-12 DIAGNOSIS — E871 Hypo-osmolality and hyponatremia: Secondary | ICD-10-CM | POA: Insufficient documentation

## 2017-03-12 DIAGNOSIS — E039 Hypothyroidism, unspecified: Secondary | ICD-10-CM | POA: Diagnosis present

## 2017-03-12 DIAGNOSIS — R410 Disorientation, unspecified: Secondary | ICD-10-CM | POA: Diagnosis not present

## 2017-03-12 DIAGNOSIS — Z8249 Family history of ischemic heart disease and other diseases of the circulatory system: Secondary | ICD-10-CM | POA: Diagnosis not present

## 2017-03-12 DIAGNOSIS — A0472 Enterocolitis due to Clostridium difficile, not specified as recurrent: Secondary | ICD-10-CM | POA: Insufficient documentation

## 2017-03-12 DIAGNOSIS — E1122 Type 2 diabetes mellitus with diabetic chronic kidney disease: Secondary | ICD-10-CM | POA: Diagnosis present

## 2017-03-12 DIAGNOSIS — I5032 Chronic diastolic (congestive) heart failure: Secondary | ICD-10-CM | POA: Diagnosis present

## 2017-03-12 DIAGNOSIS — K2970 Gastritis, unspecified, without bleeding: Secondary | ICD-10-CM | POA: Insufficient documentation

## 2017-03-12 DIAGNOSIS — F419 Anxiety disorder, unspecified: Secondary | ICD-10-CM | POA: Diagnosis present

## 2017-03-12 DIAGNOSIS — N390 Urinary tract infection, site not specified: Secondary | ICD-10-CM | POA: Diagnosis present

## 2017-03-12 DIAGNOSIS — R4182 Altered mental status, unspecified: Secondary | ICD-10-CM | POA: Diagnosis not present

## 2017-03-12 DIAGNOSIS — B961 Klebsiella pneumoniae [K. pneumoniae] as the cause of diseases classified elsewhere: Secondary | ICD-10-CM | POA: Diagnosis present

## 2017-03-12 DIAGNOSIS — I480 Paroxysmal atrial fibrillation: Secondary | ICD-10-CM | POA: Diagnosis present

## 2017-03-12 DIAGNOSIS — Z87891 Personal history of nicotine dependence: Secondary | ICD-10-CM

## 2017-03-12 DIAGNOSIS — Z7901 Long term (current) use of anticoagulants: Secondary | ICD-10-CM

## 2017-03-12 DIAGNOSIS — D509 Iron deficiency anemia, unspecified: Secondary | ICD-10-CM | POA: Diagnosis present

## 2017-03-12 DIAGNOSIS — N179 Acute kidney failure, unspecified: Secondary | ICD-10-CM | POA: Diagnosis present

## 2017-03-12 DIAGNOSIS — K219 Gastro-esophageal reflux disease without esophagitis: Secondary | ICD-10-CM | POA: Diagnosis present

## 2017-03-12 DIAGNOSIS — Z888 Allergy status to other drugs, medicaments and biological substances status: Secondary | ICD-10-CM

## 2017-03-12 DIAGNOSIS — Z7951 Long term (current) use of inhaled steroids: Secondary | ICD-10-CM

## 2017-03-12 DIAGNOSIS — E1169 Type 2 diabetes mellitus with other specified complication: Secondary | ICD-10-CM | POA: Diagnosis present

## 2017-03-12 DIAGNOSIS — Z79891 Long term (current) use of opiate analgesic: Secondary | ICD-10-CM

## 2017-03-12 DIAGNOSIS — Z91048 Other nonmedicinal substance allergy status: Secondary | ICD-10-CM

## 2017-03-12 HISTORY — DX: Muscle weakness (generalized): M62.81

## 2017-03-12 HISTORY — DX: Enterocolitis due to Clostridium difficile, not specified as recurrent: A04.72

## 2017-03-12 HISTORY — DX: Gastritis, unspecified, without bleeding: K29.70

## 2017-03-12 HISTORY — DX: Gout, unspecified: M10.9

## 2017-03-12 HISTORY — DX: Sleep apnea, unspecified: G47.30

## 2017-03-12 HISTORY — DX: Dysphagia, unspecified: R13.10

## 2017-03-12 HISTORY — DX: Iron deficiency anemia, unspecified: D50.9

## 2017-03-12 HISTORY — DX: Type 2 diabetes mellitus without complications: E11.9

## 2017-03-12 HISTORY — DX: History of falling: Z91.81

## 2017-03-12 HISTORY — DX: Hypo-osmolality and hyponatremia: E87.1

## 2017-03-12 HISTORY — DX: Difficulty in walking, not elsewhere classified: R26.2

## 2017-03-12 LAB — COMPREHENSIVE METABOLIC PANEL
ALBUMIN: 2.3 g/dL — AB (ref 3.5–5.0)
ALBUMIN: 2.6 g/dL — AB (ref 3.5–5.0)
ALK PHOS: 110 U/L (ref 38–126)
ALK PHOS: 125 U/L (ref 38–126)
ALT: 30 U/L (ref 14–54)
ALT: 34 U/L (ref 14–54)
ANION GAP: 9 (ref 5–15)
AST: 54 U/L — ABNORMAL HIGH (ref 15–41)
AST: 60 U/L — ABNORMAL HIGH (ref 15–41)
Anion gap: 7 (ref 5–15)
BILIRUBIN TOTAL: 1 mg/dL (ref 0.3–1.2)
BUN: 67 mg/dL — ABNORMAL HIGH (ref 6–20)
BUN: 65 mg/dL — ABNORMAL HIGH (ref 6–20)
CALCIUM: 8.4 mg/dL — AB (ref 8.9–10.3)
CALCIUM: 8.7 mg/dL — AB (ref 8.9–10.3)
CHLORIDE: 93 mmol/L — AB (ref 101–111)
CO2: 20 mmol/L — AB (ref 22–32)
CO2: 20 mmol/L — ABNORMAL LOW (ref 22–32)
CREATININE: 4.24 mg/dL — AB (ref 0.44–1.00)
Chloride: 91 mmol/L — ABNORMAL LOW (ref 101–111)
Creatinine, Ser: 4.28 mg/dL — ABNORMAL HIGH (ref 0.44–1.00)
GFR calc Af Amer: 11 mL/min — ABNORMAL LOW (ref >60)
GFR calc Af Amer: 11 mL/min — ABNORMAL LOW (ref >60)
GFR calc non Af Amer: 9 mL/min — ABNORMAL LOW (ref >60)
GFR calc non Af Amer: 9 mL/min — ABNORMAL LOW (ref >60)
GLUCOSE: 74 mg/dL (ref 65–99)
GLUCOSE: 84 mg/dL (ref 65–99)
POTASSIUM: 4.8 mmol/L (ref 3.5–5.1)
Potassium: 4.7 mmol/L (ref 3.5–5.1)
SODIUM: 120 mmol/L — AB (ref 135–145)
Sodium: 120 mmol/L — ABNORMAL LOW (ref 135–145)
TOTAL PROTEIN: 5.6 g/dL — AB (ref 6.5–8.1)
Total Bilirubin: 1 mg/dL (ref 0.3–1.2)
Total Protein: 4.9 g/dL — ABNORMAL LOW (ref 6.5–8.1)

## 2017-03-12 LAB — CBC WITH DIFFERENTIAL/PLATELET
BASOS ABS: 0 10*3/uL (ref 0.0–0.1)
BASOS PCT: 1 %
Basophils Absolute: 0 10*3/uL (ref 0.0–0.1)
Basophils Relative: 0 %
EOS ABS: 0.3 10*3/uL (ref 0.0–0.7)
EOS ABS: 0.4 10*3/uL (ref 0.0–0.7)
Eosinophils Relative: 4 %
Eosinophils Relative: 5 %
HCT: 25.9 % — ABNORMAL LOW (ref 36.0–46.0)
HEMATOCRIT: 29.2 % — AB (ref 36.0–46.0)
HEMOGLOBIN: 9.1 g/dL — AB (ref 12.0–15.0)
Hemoglobin: 9.9 g/dL — ABNORMAL LOW (ref 12.0–15.0)
LYMPHS ABS: 2.5 10*3/uL (ref 0.7–4.0)
Lymphocytes Relative: 36 %
Lymphocytes Relative: 31 %
Lymphs Abs: 2.4 10*3/uL (ref 0.7–4.0)
MCH: 33.3 pg (ref 26.0–34.0)
MCH: 32.1 pg (ref 26.0–34.0)
MCHC: 35.1 g/dL (ref 30.0–36.0)
MCHC: 33.9 g/dL (ref 30.0–36.0)
MCV: 94.9 fL (ref 78.0–100.0)
MCV: 94.8 fL (ref 78.0–100.0)
MONO ABS: 0.7 10*3/uL (ref 0.1–1.0)
MONOS PCT: 9 %
Monocytes Absolute: 0.6 10*3/uL (ref 0.1–1.0)
Monocytes Relative: 8 %
NEUTROS ABS: 4.4 10*3/uL (ref 1.7–7.7)
Neutro Abs: 3.6 10*3/uL (ref 1.7–7.7)
Neutrophils Relative %: 52 %
Neutrophils Relative %: 56 %
Platelets: 269 10*3/uL (ref 150–400)
Platelets: 286 10*3/uL (ref 150–400)
RBC: 2.73 MIL/uL — ABNORMAL LOW (ref 3.87–5.11)
RBC: 3.08 MIL/uL — ABNORMAL LOW (ref 3.87–5.11)
RDW: 16 % — ABNORMAL HIGH (ref 11.5–15.5)
RDW: 16 % — AB (ref 11.5–15.5)
WBC: 7 10*3/uL (ref 4.0–10.5)
WBC: 7.9 10*3/uL (ref 4.0–10.5)

## 2017-03-12 LAB — MRSA PCR SCREENING: MRSA by PCR: NEGATIVE

## 2017-03-12 LAB — GLUCOSE, CAPILLARY
Glucose-Capillary: 88 mg/dL (ref 65–99)
Glucose-Capillary: 104 mg/dL — ABNORMAL HIGH (ref 65–99)

## 2017-03-12 MED ORDER — LEVOTHYROXINE SODIUM 25 MCG PO TABS
137.0000 ug | ORAL_TABLET | Freq: Every day | ORAL | Status: DC
  Administered 2017-03-13 – 2017-03-20 (×8): 137 ug via ORAL
  Filled 2017-03-12 (×9): qty 1

## 2017-03-12 MED ORDER — ESCITALOPRAM OXALATE 10 MG PO TABS
10.0000 mg | ORAL_TABLET | Freq: Every day | ORAL | Status: DC
  Administered 2017-03-12 – 2017-03-18 (×7): 10 mg via ORAL
  Filled 2017-03-12 (×7): qty 1

## 2017-03-12 MED ORDER — INSULIN ASPART 100 UNIT/ML ~~LOC~~ SOLN: 0.0000 [IU] | Freq: Every day | SUBCUTANEOUS | Status: DC

## 2017-03-12 MED ORDER — GABAPENTIN 100 MG PO CAPS
100.0000 mg | ORAL_CAPSULE | Freq: Two times a day (BID) | ORAL | Status: DC
  Administered 2017-03-12 – 2017-03-20 (×17): 100 mg via ORAL
  Filled 2017-03-12 (×17): qty 1

## 2017-03-12 MED ORDER — ONDANSETRON HCL 4 MG PO TABS: 4.0000 mg | ORAL_TABLET | Freq: Four times a day (QID) | ORAL | Status: DC | PRN

## 2017-03-12 MED ORDER — ACETAMINOPHEN 650 MG RE SUPP: 650.0000 mg | Freq: Four times a day (QID) | RECTAL | Status: DC | PRN

## 2017-03-12 MED ORDER — FLUTICASONE FUROATE-VILANTEROL 200-25 MCG/INH IN AEPB
1.0000 | INHALATION_SPRAY | Freq: Every day | RESPIRATORY_TRACT | Status: DC
  Administered 2017-03-13 – 2017-03-20 (×8): 1 via RESPIRATORY_TRACT
  Filled 2017-03-12: qty 28

## 2017-03-12 MED ORDER — HEPARIN SODIUM (PORCINE) 5000 UNIT/ML IJ SOLN
5000.0000 [IU] | Freq: Three times a day (TID) | INTRAMUSCULAR | Status: DC
  Administered 2017-03-12 – 2017-03-20 (×22): 5000 [IU] via SUBCUTANEOUS
  Filled 2017-03-12 (×23): qty 1

## 2017-03-12 MED ORDER — GLUCERNA SHAKE PO LIQD
237.0000 mL | Freq: Three times a day (TID) | ORAL | Status: DC
  Administered 2017-03-12 – 2017-03-20 (×11): 237 mL via ORAL

## 2017-03-12 MED ORDER — PANTOPRAZOLE SODIUM 40 MG PO TBEC
40.0000 mg | DELAYED_RELEASE_TABLET | Freq: Every day | ORAL | Status: DC
  Administered 2017-03-12 – 2017-03-20 (×9): 40 mg via ORAL
  Filled 2017-03-12 (×9): qty 1

## 2017-03-12 MED ORDER — INSULIN GLARGINE 100 UNIT/ML ~~LOC~~ SOLN
20.0000 [IU] | Freq: Every day | SUBCUTANEOUS | Status: DC
  Administered 2017-03-13 – 2017-03-20 (×3): 20 [IU] via SUBCUTANEOUS
  Filled 2017-03-12 (×10): qty 0.2

## 2017-03-12 MED ORDER — ALPRAZOLAM 0.25 MG PO TABS
0.2500 mg | ORAL_TABLET | Freq: Two times a day (BID) | ORAL | Status: DC | PRN
  Administered 2017-03-15 – 2017-03-18 (×3): 0.25 mg via ORAL
  Filled 2017-03-12 (×3): qty 1

## 2017-03-12 MED ORDER — SODIUM CHLORIDE 0.9 % IV SOLN
INTRAVENOUS | Status: DC
  Administered 2017-03-12 – 2017-03-19 (×9): via INTRAVENOUS

## 2017-03-12 MED ORDER — ACETAMINOPHEN 325 MG PO TABS: 650.0000 mg | ORAL_TABLET | Freq: Four times a day (QID) | ORAL | Status: DC | PRN

## 2017-03-12 MED ORDER — HYDROCODONE-ACETAMINOPHEN 7.5-325 MG PO TABS
1.0000 | ORAL_TABLET | Freq: Three times a day (TID) | ORAL | Status: DC | PRN
  Administered 2017-03-15 (×2): 1 via ORAL
  Filled 2017-03-12 (×2): qty 1

## 2017-03-12 MED ORDER — INSULIN ASPART 100 UNIT/ML ~~LOC~~ SOLN: 0.0000 [IU] | Freq: Three times a day (TID) | SUBCUTANEOUS | Status: DC

## 2017-03-12 MED ORDER — ALLOPURINOL 300 MG PO TABS
150.0000 mg | ORAL_TABLET | Freq: Every day | ORAL | Status: DC
Start: 2017-03-12 — End: 2017-03-20
  Administered 2017-03-12 – 2017-03-20 (×9): 150 mg via ORAL
  Filled 2017-03-12 (×9): qty 1

## 2017-03-12 MED ORDER — SODIUM CHLORIDE 0.9 % IV BOLUS (SEPSIS)
500.0000 mL | Freq: Once | INTRAVENOUS | Status: AC
  Administered 2017-03-12: 500 mL via INTRAVENOUS

## 2017-03-12 MED ORDER — METOCLOPRAMIDE HCL 10 MG PO TABS: 5.0000 mg | ORAL_TABLET | Freq: Four times a day (QID) | ORAL | Status: DC | PRN

## 2017-03-12 MED ORDER — METOPROLOL TARTRATE 25 MG PO TABS
12.5000 mg | ORAL_TABLET | Freq: Two times a day (BID) | ORAL | Status: DC
  Administered 2017-03-12 – 2017-03-20 (×15): 12.5 mg via ORAL
  Filled 2017-03-12 (×17): qty 1

## 2017-03-12 MED ORDER — ONDANSETRON HCL 4 MG/2ML IJ SOLN
4.0000 mg | Freq: Four times a day (QID) | INTRAMUSCULAR | Status: DC | PRN
  Administered 2017-03-18 – 2017-03-19 (×3): 4 mg via INTRAVENOUS
  Filled 2017-03-12 (×3): qty 2

## 2017-03-12 NOTE — ED Notes (Signed)
Hospitalist at bedside 

## 2017-03-12 NOTE — H&P (Signed)
History and Physical    Yolanda [name] RUE:[id number] DOB: 01/01/1941 DOA: 03/12/2017  PCP: Kari Baars, MD  Patient coming from: SNF  I have personally briefly reviewed patient's old medical records in [facility]  Chief Complaint: diarrhea  HPI: Yolanda [name] is a 76 y.o. female with medical history significant of COPD, C. difficile colitis, diabetes, hypertension, paroxysmal atrial fibrillation. Patient reports having persistent diarrhea for over a week now. She is intermittently had vomiting. She feels generally weak. She has some dysuria. Denies any shortness of breath or chest pain. No fevers. Routine labs were checked at the nursing home she was found to have sodium of 120 and creatinine of 4.2. She also admits to decreased oral intake over the past week. Review of records indicate that her diuretics were recently held. She was sent to the ER for further evaluation.  ED Course: In the emergency room, vitals were noted to be stable. Sodium was confirmed low at 120 and creatinine elevated at 4.2. CBC appears to be near her baseline. She was referred to admission.  Review of Systems: As per HPI otherwise 10 point review of systems negative.    Past Medical History:  Diagnosis Date  . Barrett esophagus   . Bronchitis   . C. difficile colitis   . Cellulitis   . Cellulitis   . CHF (congestive heart failure) (HCC)   . COPD (chronic obstructive pulmonary disease) (HCC)   . Diabetes mellitus without complication (HCC)   . Diastolic heart failure (HCC)   . Difficulty walking   . Dysphagia   . Essential hypertension   . Gastritis   . GERD (gastroesophageal reflux disease)   . Gout   . Hepatomegaly    ???  . History of falling   . Hyponatremia   . Hypothyroidism   . Iron deficiency anemia   . Muscle weakness   . PAF (paroxysmal atrial fibrillation) (HCC)    Eliquis  . Sleep apnea   . Stage III chronic kidney disease     Past Surgical History:  Procedure  Laterality Date  . BREAST BIOPSY Right Sept, 2012  . CHOLECYSTECTOMY    . COLONOSCOPY    . ESOPHAGEAL DILATION N/A 02/05/2017   Procedure: ESOPHAGEAL DILATION;  Surgeon: Malissa Hippo, MD;  [facility];  Service: Endoscopy;  Laterality: N/A;  . ESOPHAGOGASTRODUODENOSCOPY N/A 02/05/2017   Procedure: ESOPHAGOGASTRODUODENOSCOPY (EGD);  Surgeon: Malissa Hippo, MD;  Location: [facility];  Service: Endoscopy;  Laterality: N/A;  . UPPER GASTROINTESTINAL ENDOSCOPY       reports that she quit smoking about 17 years ago. Her smoking use included Cigarettes. She started smoking about 58 years ago. She has a 15.00 pack-year smoking history. She has never used smokeless tobacco. She reports that she does not drink alcohol or use drugs.  Allergies  Allergen Reactions  . Ciprofloxacin Shortness Of Breath    REACTION: sob,tachycardia  . Doxycycline Nausea Only    Also experienced diarrhea   . Fish Oil     Patient face drew to the side,Bells Palsey  . Guaifenesin Shortness Of Breath    REACTION: sob,tachycardia  . Mucinex [Guaifenesin Er] Shortness Of Breath  . Sulfamethoxazole W/Trimethoprim 800-160 [Sulfamethoxazole-Trimethoprim] Nausea Only    Also lack of appetite   . Uloric [Febuxostat] Swelling    No urination  . Adhesive [Tape] Other (See Comments)    Causes blisters on skin  . Allopurinol Other (See Comments)    Couldn't urinate  .  Cefuroxime Axetil Swelling    Swelling all over body-per patient she was hospitalized as a result of taking this medication  . Metolazone Nausea Only    Swelling   . Tramadol     insomnia    Family History  Problem Relation Age of Onset  . Dementia Mother   . Lung cancer Father        smoked  . Hypertension Sister   . Diabetes Brother   . Obesity Sister   . Hypertension Sister   . Restless legs syndrome Sister   . Healthy Sister   . Lung cancer Maternal Uncle      Prior to Admission medications   Medication Sig Start Date  End Date Taking? Authorizing Provider  acetaminophen (TYLENOL) 325 MG tablet Take 2 tablets (650 mg total) by mouth every 6 (six) hours as needed for mild pain (or Fever >/= 101). 01/07/17  Yes Kari BaarsHawkins, Edward, MD  albuterol (PROVENTIL HFA;VENTOLIN HFA) 108 (90 Base) MCG/ACT inhaler Inhale 2 puffs into the lungs 4 (four) times daily.    Yes [provider]  allopurinol (ZYLOPRIM) 300 MG tablet Take 0.5 tablets (150 mg total) by mouth daily. 02/07/17  Yes Kari BaarsHawkins, Edward, MD  ALPRAZolam Prudy Feeler(XANAX) 0.25 MG tablet Take 1 tablet (0.25 mg total) by mouth 2 (two) times daily as needed for anxiety. 02/06/17 02/06/18 Yes Kari BaarsHawkins, Edward, MD  apixaban (ELIQUIS) 5 MG TABS tablet Take 1 tablet (5 mg total) by mouth 2 (two) times daily. 11/13/14  Yes Kari BaarsHawkins, Edward, MD  [facility] [place](VENELEX) OINT Apply 1 application topically See admin instructions. Apply to sacrum and bilateral buttocks every shift and as needed.   Yes [provider]  calcium carbonate (TUMS - DOSED IN MG ELEMENTAL CALCIUM) 500 MG chewable tablet Chew 1 tablet by mouth 2 (two) times daily.   Yes [provider]  cetirizine (ZYRTEC) 10 MG tablet Take 10 mg by mouth daily.   Yes [provider]  cholecalciferol (VITAMIN D) 1000 units tablet Take 1,000 Units by mouth every morning.   Yes [provider]  colchicine 0.6 MG tablet Take 1 tablet (0.6 mg total) by mouth 2 (two) times daily. 02/06/17  Yes Kari BaarsHawkins, Edward, MD  diltiazem (CARDIZEM CD) 120 MG 24 hr capsule Take 1 capsule (120 mg total) by mouth daily. 01/26/16  Yes Laqueta LindenKoneswaran, Suresh A, MD  escitalopram (LEXAPRO) 10 MG tablet Take 10 mg by mouth daily.   Yes [provider]  feeding supplement, GLUCERNA SHAKE, (GLUCERNA SHAKE) LIQD Take 237 mLs by mouth 3 (three) times daily between meals. 01/07/17  Yes Kari BaarsHawkins, Edward, MD  fluticasone furoate-vilanterol (BREO ELLIPTA) 200-25 MCG/INH AEPB Inhale 1 puff into the lungs daily.   Yes  [provider]  gabapentin (NEURONTIN) 100 MG capsule Take 100 mg by mouth 2 (two) times daily.   Yes [provider]  HYDROcodone-acetaminophen (NORCO) 7.5-325 MG tablet Take 1 tablet by mouth 3 (three) times daily as needed for moderate pain.   Yes [provider]  insulin glargine (LANTUS) 100 UNIT/ML injection Inject 0.2 mLs (20 Units total) into the skin daily. 01/07/17  Yes Kari BaarsHawkins, Edward, MD  insulin lispro (HUMALOG) 100 UNIT/ML injection Inject 3-20 Units into the skin 3 (three) times daily before meals. 121-150=1 units 151-200=2 units 201-250=3 units 251-300=5units 301-350=7units 351-400=9units   Yes [provider]  levothyroxine (SYNTHROID, LEVOTHROID) 137 MCG tablet Take 137 mcg by mouth daily before breakfast.   Yes [provider]  loperamide (IMODIUM A-D)  2 MG tablet Take 2 mg by mouth 4 (four) times daily as needed for diarrhea or loose stools.   Yes [provider]  metoCLOPramide (REGLAN) 10 MG tablet Take 0.5 tablets (5 mg total) by mouth every 6 (six) hours as needed for nausea (nausea). 02/10/17  Yes Doug Sou, MD  metoprolol tartrate (LOPRESSOR) 25 MG tablet TAKE 1/2 TABLET BY MOUTH TWICE DAILY 10/23/16  Yes Laqueta Linden, MD  Multiple Vitamin (MULTIVITAMIN WITH MINERALS) TABS tablet Take 1 tablet by mouth daily.   Yes [provider]  omeprazole (PRILOSEC) 20 MG capsule TAKE ONE CAPSULE BY MOUTH DAILY 05/29/16  Yes Rehman, Joline Maxcy, MD  ondansetron (ZOFRAN) 4 MG tablet Take 1 tablet (4 mg total) by mouth every 6 (six) hours as needed for nausea. 01/07/17  Yes Kari Baars, MD  polyvinyl alcohol (LIQUIFILM TEARS) 1.4 % ophthalmic solution Place 1 drop into both eyes 4 (four) times daily as needed for dry eyes. 01/07/17  Yes Kari Baars, MD  promethazine (PHENERGAN) 12.5 MG tablet Take 12.5 mg by mouth every 6 (six) hours as needed for nausea or vomiting.   Yes [provider]  triamcinolone  (NASACORT ALLERGY 24HR) 55 MCG/ACT AERO nasal inhaler Place 2 sprays into the nose daily.   Yes [provider]  Doxepin HCl (SILENOR) 6 MG TABS Take 1 tablet (6 mg total) by mouth at bedtime as needed. 02/06/17   Kari Baars, MD  potassium chloride SA (K-DUR,KLOR-CON) 20 MEQ tablet Take 1 tablet (20 mEq total) by mouth daily. Patient not taking: Reported on 03/12/2017 12/10/16   Antoine Poche, MD  torsemide (DEMADEX) 20 MG tablet Take 1 tablet (20 mg total) by mouth 2 (two) times daily. (may take an extra 20mg  as needed) Patient not taking: Reported on 03/12/2017 01/07/17   Kari Baars, MD    Physical Exam: Vitals:   03/12/17 1136 03/12/17 1138 03/12/17 1200  BP:  (!) 126/53 (!) 117/52  Pulse:  87 85  Resp:  20 18  Temp:  97.4 F (36.3 C)   TempSrc:  Oral   SpO2:  96% 96%  Weight: 88.5 kg (195 lb)    Height: 5\' 1"  (1.549 m)      Constitutional: NAD, calm, comfortable Vitals:   03/12/17 1136 03/12/17 1138 03/12/17 1200  BP:  (!) 126/53 (!) 117/52  Pulse:  87 85  Resp:  20 18  Temp:  97.4 F (36.3 C)   TempSrc:  Oral   SpO2:  96% 96%  Weight: 88.5 kg (195 lb)    Height: 5\' 1"  (1.549 m)     Eyes: PERRL, lids and conjunctivae normal ENMT: Mucous membranes are moist. Posterior pharynx clear of any exudate or lesions.Normal dentition.  Neck: normal, supple, no masses, no thyromegaly Respiratory: clear to auscultation bilaterally, no wheezing, no crackles. Normal respiratory effort. No accessory muscle use.  Cardiovascular: Regular rate and rhythm, no murmurs / rubs / gallops. No extremity edema. 2+ pedal pulses. No carotid bruits.  Abdomen: no tenderness, no masses palpated. No hepatosplenomegaly. Bowel sounds positive.  Musculoskeletal: no clubbing / cyanosis. No joint deformity upper and lower extremities. Good ROM, no contractures. Normal muscle tone.  Skin: no rashes, lesions, ulcers. No induration Neurologic: CN 2-12 grossly intact. Sensation intact, DTR  normal. Strength 5/5 in all 4.  Psychiatric: Normal judgment and insight. Alert and oriented x 3. Normal mood.   Labs on Admission: I have personally reviewed following labs and imaging studies  CBC:  Recent  Labs Lab 03/12/17 0717 03/12/17 1136  WBC 7.0 7.9  NEUTROABS 3.6 4.4  HGB 9.1* 9.9*  HCT 25.9* 29.2*  MCV 94.9 94.8  PLT 269 286   Basic Metabolic Panel:  Recent Labs Lab 03/12/17 0717 03/12/17 1136  NA 120* 120*  K 4.7 4.8  CL 93* 91*  CO2 20* 20*  GLUCOSE 74 84  BUN 67* 65*  CREATININE 4.24* 4.28*  CALCIUM 8.4* 8.7*   GFR: Estimated Creatinine Clearance: 11.7 mL/min (A) (by C-G formula based on SCr of 4.28 mg/dL (H)). Liver Function Tests:  Recent Labs Lab 03/12/17 0717 03/12/17 1136  AST 54* 60*  ALT 30 34  ALKPHOS 110 125  BILITOT 1.0 1.0  PROT 4.9* 5.6*  ALBUMIN 2.3* 2.6*   No results for input(s): LIPASE, AMYLASE in the last 168 hours. No results for input(s): AMMONIA in the last 168 hours. Coagulation Profile: No results for input(s): INR, PROTIME in the last 168 hours. Cardiac Enzymes: No results for input(s): CKTOTAL, CKMB, CKMBINDEX, TROPONINI in the last 168 hours. BNP (last 3 results) No results for input(s): PROBNP in the last 8760 hours. HbA1C: No results for input(s): HGBA1C in the last 72 hours. CBG: No results for input(s): GLUCAP in the last 168 hours. Lipid Profile: No results for input(s): CHOL, HDL, LDLCALC, TRIG, CHOLHDL, LDLDIRECT in the last 72 hours. Thyroid Function Tests: No results for input(s): TSH, T4TOTAL, FREET4, T3FREE, THYROIDAB in the last 72 hours. Anemia Panel: No results for input(s): VITAMINB12, FOLATE, FERRITIN, TIBC, IRON, RETICCTPCT in the last 72 hours. Urine analysis:    Component Value Date/Time   COLORURINE YELLOW 01/28/2017 1626   APPEARANCEUR TURBID (A) 01/28/2017 1626   LABSPEC 1.004 (L) 01/28/2017 1626   PHURINE 5.0 01/28/2017 1626   GLUCOSEU NEGATIVE 01/28/2017 1626   HGBUR MODERATE (A)  01/28/2017 1626   BILIRUBINUR NEGATIVE 01/28/2017 1626   KETONESUR NEGATIVE 01/28/2017 1626   PROTEINUR 100 (A) 01/28/2017 1626   NITRITE NEGATIVE 01/28/2017 1626   LEUKOCYTESUR MODERATE (A) 01/28/2017 1626    Radiological Exams on Admission: No results found.  Assessment/Plan Active Problems:   GERD (gastroesophageal reflux disease)   COPD (chronic obstructive pulmonary disease) (HCC)   Hypothyroidism   PAF (paroxysmal atrial fibrillation) (HCC)   Hyponatremia   AKI (acute kidney injury) (HCC)   Diabetes mellitus type 2 in obese (HCC)   Diarrhea    1. Hyponatremia. Suspect this is hypovolemic hyponatremia due to diarrhea. Started on IV hydration and follow serial labs. 2. Acute kidney injury. Suspect prerenal related to dehydration from diarrhea. Started on IV fluids and recheck in a.m . if no improvement, consider further workup. 3. Diarrhea. On last admission, tested positive for C. difficile antigen, but negative toxin and PCR. Case discussed with Dr. Juanetta Gosling who is familiar with her care. It was recommended to recheck C. difficile as well as GI pathogen panel. Will also consult gastroenterology to workup other causes of diarrhea. 4. Paroxysmal atrial fibrillation. Hold diltiazem since blood pressure is soft. Continue low-dose metoprolol. We'll hold eliquis for now since creatinine is elevated. Use heparin for DVT prophylaxis. Resume anticoagulation once renal function is better. 5. Hypothyroidism. Continue Synthroid 6. COPD . appears stable. Continue current treatments. Diabetes. Continue on basal insulin. Supplement with sliding scale 7. GERD. Continue on PPI  DVT prophylaxis: heparin Code Status: full code Family Communication: discussed with family at the bedside Disposition Plan: discharge back to SNF when improved Consults called: gastroenterology Admission status: inpatient, telemetry   Jahnyla Parrillo MD  [facility] Pager [phone]  If 7PM-7AM, please  contact night-coverage [email or web address] Password [email or web address]  03/12/2017, 2:07 PM

## 2017-03-12 NOTE — ED Provider Notes (Signed)
[facility]PT Provider Note   CSN: [id number] Arrival date & time: 03/12/17  1129     History   Chief Complaint Chief Complaint  Patient presents with  . abnormal labs    HPI Yolanda [name] is a 76 y.o. female.  HPI Patient was sent from the nursing home for nausea vomiting with some diarrhea. Generalized weakness. Found at nursing home to have a sodium of 120 and a creatinine of 4.2. Has had decreased oral intake. Patient Dr. Juanetta Gosling. Slight upper abdominal pain. Reportedly had tested positive for C. difficile. She also is been told she was just colonized. Past Medical History:  Diagnosis Date  . Barrett esophagus   . Bronchitis   . C. difficile colitis   . Cellulitis   . Cellulitis   . CHF (congestive heart failure) (HCC)   . COPD (chronic obstructive pulmonary disease) (HCC)   . Diabetes mellitus without complication (HCC)   . Diastolic heart failure (HCC)   . Difficulty walking   . Dysphagia   . Essential hypertension   . Gastritis   . GERD (gastroesophageal reflux disease)   . Gout   . Hepatomegaly    ???  . History of falling   . Hyponatremia   . Hypothyroidism   . Iron deficiency anemia   . Muscle weakness   . PAF (paroxysmal atrial fibrillation) (HCC)    Eliquis  . Sleep apnea   . Stage III chronic kidney disease     Patient Active Problem List   Diagnosis Date Noted  . Schatzki's ring 02/06/2017  . Gastritis 02/06/2017  . Esophageal dysphagia   . Elevated LFTs 01/28/2017  . Diabetes mellitus type 2 in obese (HCC) 01/28/2017  . Metabolic encephalopathy 01/01/2017  . Acute encephalopathy 12/30/2016  . Acute lower UTI 12/30/2016  . Pain, dental 12/30/2016  . Spasm of back muscles 12/30/2016  . Hyperglycemia 12/30/2016  . Gout attack 09/07/2016  . Chronic anticoagulation 02/02/2016  . Chronic edema 02/02/2016  . Obesity 02/02/2016  . Sleep apnea 02/02/2016  . RBBB 02/02/2016  . Acute on chronic diastolic heart failure (HCC) 01/13/2016    . Hyponatremia 01/06/2016  . AKI (acute kidney injury) (HCC) 01/06/2016  . Cellulitis of left lower extremity 12/15/2014  . PAF (paroxysmal atrial fibrillation) (HCC) 11/09/2014  . COPD (chronic obstructive pulmonary disease) (HCC) 11/06/2014  . Neuropathic pain 11/06/2014  . Hypothyroidism 11/06/2014  . Hyperkalemia 11/06/2014  . HTN (hypertension) 01/12/2014  . Upper airway cough syndrome 09/03/2013  . GERD (gastroesophageal reflux disease) 11/13/2011  . Short-segment Barrett's esophagus 11/13/2011    Past Surgical History:  Procedure Laterality Date  . BREAST BIOPSY Right Sept, 2012  . CHOLECYSTECTOMY    . COLONOSCOPY    . ESOPHAGEAL DILATION N/A 02/05/2017   Procedure: ESOPHAGEAL DILATION;  Surgeon: Malissa Hippo, MD;  Location: [facility];  Service: Endoscopy;  Laterality: N/A;  . ESOPHAGOGASTRODUODENOSCOPY N/A 02/05/2017   Procedure: ESOPHAGOGASTRODUODENOSCOPY (EGD);  Surgeon: Malissa Hippo, MD;  Location: [facility];  Service: Endoscopy;  Laterality: N/A;  . UPPER GASTROINTESTINAL ENDOSCOPY      OB History    Gravida Para Term Preterm AB Living             0   SAB TAB Ectopic Multiple Live Births                   Home Medications    Prior to Admission medications   Medication Sig Start Date End Date Taking?  Authorizing Provider  acetaminophen (TYLENOL) 325 MG tablet Take 2 tablets (650 mg total) by mouth every 6 (six) hours as needed for mild pain (or Fever >/= 101). 01/07/17   Kari Baars, MD  albuterol (PROVENTIL HFA;VENTOLIN HFA) 108 (90 Base) MCG/ACT inhaler Inhale 2 puffs into the lungs 4 (four) times daily.     [provider]  allopurinol (ZYLOPRIM) 300 MG tablet Take 0.5 tablets (150 mg total) by mouth daily. 02/07/17   Kari Baars, MD  ALPRAZolam Prudy Feeler) 0.25 MG tablet Take 1 tablet (0.25 mg total) by mouth 2 (two) times daily as needed for anxiety. 02/06/17 02/06/18  Kari Baars, MD  apixaban (ELIQUIS) 5 MG TABS tablet Take 1  tablet (5 mg total) by mouth 2 (two) times daily. 11/13/14   Kari Baars, MD  [facility]) OINT Apply 1 application topically See admin instructions. Apply to sacrum and bilateral buttocks every shift and as needed.    [provider]  calcium carbonate (TUMS - DOSED IN MG ELEMENTAL CALCIUM) 500 MG chewable tablet Chew 1 tablet by mouth 2 (two) times daily.    [provider]  cetirizine (ZYRTEC) 10 MG tablet Take 10 mg by mouth daily.    [provider]  cholecalciferol (VITAMIN D) 1000 units tablet Take 1,000 Units by mouth every morning.    [provider]  colchicine 0.6 MG tablet Take 1 tablet (0.6 mg total) by mouth 2 (two) times daily. 02/06/17   Kari Baars, MD  diltiazem (CARDIZEM CD) 120 MG 24 hr capsule Take 1 capsule (120 mg total) by mouth daily. 01/26/16   Laqueta Linden, MD  Doxepin HCl (SILENOR) 6 MG TABS Take 1 tablet (6 mg total) by mouth at bedtime as needed. 02/06/17   Kari Baars, MD  escitalopram (LEXAPRO) 10 MG tablet Take 10 mg by mouth daily.    [provider]  feeding supplement, GLUCERNA SHAKE, (GLUCERNA SHAKE) LIQD Take 237 mLs by mouth 3 (three) times daily between meals. 01/07/17   Kari Baars, MD  fluticasone furoate-vilanterol (BREO ELLIPTA) 200-25 MCG/INH AEPB Inhale 1 puff into the lungs daily.    [provider]  gabapentin (NEURONTIN) 100 MG capsule Take 100 mg by mouth 2 (two) times daily.    [provider]  HYDROcodone-acetaminophen (NORCO) 7.5-325 MG tablet Take 1 tablet by mouth 3 (three) times daily as needed for moderate pain.    [provider]  insulin glargine (LANTUS) 100 UNIT/ML injection Inject 0.2 mLs (20 Units total) into the skin daily. 01/07/17   Kari Baars, MD  insulin lispro (HUMALOG) 100 UNIT/ML injection Inject 3-20 Units into the skin 3 (three) times daily before meals. 121-150=1 units 151-200=2 units 201-250=3  units 251-300=5units 301-350=7units 351-400=9units    [provider]  levothyroxine (SYNTHROID, LEVOTHROID) 137 MCG tablet Take 137 mcg by mouth daily before breakfast.    [provider]  metoCLOPramide (REGLAN) 10 MG tablet Take 0.5 tablets (5 mg total) by mouth every 6 (six) hours as needed for nausea (nausea). 02/10/17   Doug Sou, MD  metoprolol tartrate (LOPRESSOR) 25 MG tablet TAKE 1/2 TABLET BY MOUTH TWICE DAILY 10/23/16   Laqueta Linden, MD  Multiple Vitamin (MULTIVITAMIN WITH MINERALS) TABS tablet Take 1 tablet by mouth daily.    [provider]  omeprazole (PRILOSEC) 20 MG capsule TAKE ONE CAPSULE BY MOUTH DAILY 05/29/16   Rehman, Joline Maxcy, MD  ondansetron (ZOFRAN) 4 MG tablet Take 1 tablet (4 mg total) by mouth  every 6 (six) hours as needed for nausea. 01/07/17   Kari Baars, MD  polyvinyl alcohol (LIQUIFILM TEARS) 1.4 % ophthalmic solution Place 1 drop into both eyes 4 (four) times daily as needed for dry eyes. 01/07/17   Kari Baars, MD  potassium chloride SA (K-DUR,KLOR-CON) 20 MEQ tablet Take 1 tablet (20 mEq total) by mouth daily. 12/10/16   Antoine Poche, MD  torsemide (DEMADEX) 20 MG tablet Take 1 tablet (20 mg total) by mouth 2 (two) times daily. (may take an extra 20mg  as needed) 01/07/17   Kari Baars, MD    Family History Family History  Problem Relation Age of Onset  . Dementia Mother   . Lung cancer Father        smoked  . Hypertension Sister   . Diabetes Brother   . Obesity Sister   . Hypertension Sister   . Restless legs syndrome Sister   . Healthy Sister   . Lung cancer Maternal Uncle     Social History Social History  Substance Use Topics  . Smoking status: Former Smoker    Packs/day: 0.50    Years: 30.00    Types: Cigarettes    Start date: 09/24/1958    Quit date: 11/13/1999  . Smokeless tobacco: Never Used  . Alcohol use No     Allergies   Ciprofloxacin; Doxycycline; Fish oil; Guaifenesin;  Mucinex [guaifenesin er]; Sulfamethoxazole w/trimethoprim 800-160 [sulfamethoxazole-trimethoprim]; Uloric [febuxostat]; Adhesive [tape]; Allopurinol; Cefuroxime axetil; Metolazone; and Tramadol   Review of Systems Review of Systems  Constitutional: Positive for fatigue.  HENT: Negative for congestion.   Respiratory: Negative for shortness of breath.   Cardiovascular: Negative for chest pain.  Gastrointestinal: Positive for abdominal pain, diarrhea, nausea and vomiting.  Genitourinary: Negative for dysuria.  Musculoskeletal: Negative for back pain.  Skin: Negative for rash.  Neurological: Negative for seizures and weakness.  Psychiatric/Behavioral: Negative for confusion.     Physical Exam Updated Vital Signs BP (!) 117/52   Pulse 85   Temp 97.4 F (36.3 C) (Oral)   Resp 18   Ht 5\' 1"  (1.549 m)   Wt 88.5 kg (195 lb)   SpO2 96%   BMI 36.84 kg/m   Physical Exam  Constitutional: She appears well-developed.  HENT:  Head: Atraumatic.  Neck: No JVD present.  Cardiovascular: Normal rate.   Pulmonary/Chest: Effort normal.  Abdominal: Soft. She exhibits distension.  Mild distention without hernia or mass.  Musculoskeletal: She exhibits no tenderness.  Neurological: She is alert.  Skin: Skin is warm. Capillary refill takes less than 2 seconds.  Psychiatric: She has a normal mood and affect.     ED Treatments / Results  Labs (all labs ordered are listed, but only abnormal results are displayed) Labs Reviewed  CBC WITH DIFFERENTIAL/PLATELET - Abnormal; Notable for the following:       Result Value   RBC 3.08 (*)    Hemoglobin 9.9 (*)    HCT 29.2 (*)    RDW 16.0 (*)    All other components within normal limits  COMPREHENSIVE METABOLIC PANEL - Abnormal; Notable for the following:    Sodium 120 (*)    Chloride 91 (*)    CO2 20 (*)    BUN 65 (*)    Creatinine, Ser 4.28 (*)    Calcium 8.7 (*)    Total Protein 5.6 (*)    Albumin 2.6 (*)    AST 60 (*)    GFR calc non  Af Amer 9 (*)  GFR calc Af Amer 11 (*)    All other components within normal limits    EKG  EKG Interpretation None       Radiology No results found.  Procedures Procedures (including critical care time)  Medications Ordered in ED Medications  sodium chloride 0.9 % bolus 500 mL (not administered)     Initial Impression / Assessment and Plan / ED Course  I have reviewed the triage vital signs and the nursing notes.  Pertinent labs & imaging results that were available during my care of the patient were reviewed by me and considered in my medical decision making (see chart for details).   patient with hyponatremia and acute kidney injury. Likely due to dehydration due to her nausea vomiting and diarrhea. Had similar symptoms a month ago. Will admit to hospitalist for Dr. Juanetta GoslingHawkins for  Final Clinical Impressions(s) / ED Diagnoses   Final diagnoses:  Hyponatremia  Acute kidney injury superimposed on chronic kidney disease [facility](HCC)    New Prescriptions New Prescriptions   No medications on file     Benjiman CorePickering, Coy Vandoren, MD 03/12/17 1222

## 2017-03-12 NOTE — ED Triage Notes (Signed)
PT sent to ED from [facility] d/t N/V x1 week with decreased po intake and generalized weakness. Lab results this am were abnormal as well. Sodium- 120, Creatine 4.24. PT c/o some mild middle abdominal discomfort today.

## 2017-03-13 DIAGNOSIS — K219 Gastro-esophageal reflux disease without esophagitis: Secondary | ICD-10-CM

## 2017-03-13 DIAGNOSIS — R197 Diarrhea, unspecified: Secondary | ICD-10-CM

## 2017-03-13 DIAGNOSIS — D649 Anemia, unspecified: Secondary | ICD-10-CM

## 2017-03-13 LAB — COMPREHENSIVE METABOLIC PANEL
ALK PHOS: 108 U/L (ref 38–126)
ALT: 30 U/L (ref 14–54)
ANION GAP: 7 (ref 5–15)
AST: 54 U/L — ABNORMAL HIGH (ref 15–41)
Albumin: 2.2 g/dL — ABNORMAL LOW (ref 3.5–5.0)
BUN: 57 mg/dL — ABNORMAL HIGH (ref 6–20)
CALCIUM: 8 mg/dL — AB (ref 8.9–10.3)
CO2: 19 mmol/L — AB (ref 22–32)
CREATININE: 3.33 mg/dL — AB (ref 0.44–1.00)
Chloride: 100 mmol/L — ABNORMAL LOW (ref 101–111)
GFR, EST AFRICAN AMERICAN: 15 mL/min — AB (ref >60)
GFR, EST NON AFRICAN AMERICAN: 13 mL/min — AB (ref >60)
Glucose, Bld: 79 mg/dL (ref 65–99)
Potassium: 4.7 mmol/L (ref 3.5–5.1)
SODIUM: 126 mmol/L — AB (ref 135–145)
TOTAL PROTEIN: 4.8 g/dL — AB (ref 6.5–8.1)
Total Bilirubin: 1 mg/dL (ref 0.3–1.2)

## 2017-03-13 LAB — GLUCOSE, CAPILLARY
GLUCOSE-CAPILLARY: 78 mg/dL (ref 65–99)
GLUCOSE-CAPILLARY: 76 mg/dL (ref 65–99)
GLUCOSE-CAPILLARY: 88 mg/dL (ref 65–99)
Glucose-Capillary: 88 mg/dL (ref 65–99)

## 2017-03-13 LAB — C DIFFICILE QUICK SCREEN W PCR REFLEX
C DIFFICLE (CDIFF) ANTIGEN: POSITIVE — AB
C Diff toxin: NEGATIVE

## 2017-03-13 LAB — CBC
HCT: 25.7 % — ABNORMAL LOW (ref 36.0–46.0)
HEMOGLOBIN: 8.7 g/dL — AB (ref 12.0–15.0)
MCH: 32.5 pg (ref 26.0–34.0)
MCHC: 33.9 g/dL (ref 30.0–36.0)
MCV: 95.9 fL (ref 78.0–100.0)
PLATELETS: 262 10*3/uL (ref 150–400)
RBC: 2.68 MIL/uL — ABNORMAL LOW (ref 3.87–5.11)
RDW: 15.9 % — ABNORMAL HIGH (ref 11.5–15.5)
WBC: 6.2 10*3/uL (ref 4.0–10.5)

## 2017-03-13 MED ORDER — VANCOMYCIN 50 MG/ML ORAL SOLUTION
250.0000 mg | Freq: Four times a day (QID) | ORAL | Status: DC
  Administered 2017-03-13 – 2017-03-20 (×28): 250 mg via ORAL
  Filled 2017-03-13 (×37): qty 5

## 2017-03-13 NOTE — Progress Notes (Signed)
Subjective: She was admitted yesterday with another episode of dehydration and acute kidney injury and hyponatremia. This is similar to what she had about 6 weeks ago. She had been at the skilled care facility and plans are being made for her to be discharged because it was said that she was not cooperative with therapy. She says that she could not cooperate with therapy and it appears that this is likely because of her dehydration and hyponatremia and acute kidney injury. She feels better this morning. She is still having diarrhea. No other new complaints. C. difficile panel is pending. Her last C. difficile panel was indeterminate. She has had some problems with depression but says she's starting to feel the effects of the Lexapro now. I reviewed records from her last hospitalization and from the skilled care facility. I had held her diuretics starting about 10 days ago Objective: Vital signs in last 24 hours: Temp:  [97.4 F (36.3 C)-98 F (36.7 C)] 97.6 F (36.4 C) (06/20 0450) Pulse Rate:  [85-94] 86 (06/20 0450) Resp:  [16-20] 18 (06/20 0450) BP: (93-129)/(36-53) 129/52 (06/20 0450) SpO2:  [94 %-98 %] 97 % (06/20 0819) Weight:  [88.5 kg (195 lb)] 88.5 kg (195 lb) (06/19 1136) Weight change:  Last BM Date: 03/13/17  Intake/Output from previous day: 06/19 0701 - 06/20 0700 In: 558.8 [P.O.:240; I.V.:318.8] Out: -   PHYSICAL EXAM General appearance: alert, cooperative, mild distress and morbidly obese Resp: clear to auscultation bilaterally Cardio: regular rate and rhythm, S1, S2 normal, no murmur, click, rub or gallop GI: Mildly tender with hyperactive bowel sounds Extremities: extremities normal, atraumatic, no cyanosis or edema Skin turgor is poor  Lab Results:  Results for orders placed or performed during the hospital encounter of 03/12/17 (from the past 48 hour(s))  CBC with Differential     Status: Abnormal   Collection Time: 03/12/17 11:36 AM  Result Value Ref Range   WBC  7.9 4.0 - 10.5 K/uL   RBC 3.08 (L) 3.87 - 5.11 MIL/uL   Hemoglobin 9.9 (L) 12.0 - 15.0 g/dL   HCT 29.2 (L) 36.0 - 46.0 %   MCV 94.8 78.0 - 100.0 fL   MCH 32.1 26.0 - 34.0 pg   MCHC 33.9 30.0 - 36.0 g/dL   RDW 16.0 (H) 11.5 - 15.5 %   Platelets 286 150 - 400 K/uL   Neutrophils Relative % 56 %   Neutro Abs 4.4 1.7 - 7.7 K/uL   Lymphocytes Relative 31 %   Lymphs Abs 2.4 0.7 - 4.0 K/uL   Monocytes Relative 9 %   Monocytes Absolute 0.7 0.1 - 1.0 K/uL   Eosinophils Relative 5 %   Eosinophils Absolute 0.4 0.0 - 0.7 K/uL   Basophils Relative 1 %   Basophils Absolute 0.0 0.0 - 0.1 K/uL  Comprehensive metabolic panel     Status: Abnormal   Collection Time: 03/12/17 11:36 AM  Result Value Ref Range   Sodium 120 (L) 135 - 145 mmol/L   Potassium 4.8 3.5 - 5.1 mmol/L   Chloride 91 (L) 101 - 111 mmol/L   CO2 20 (L) 22 - 32 mmol/L   Glucose, Bld 84 65 - 99 mg/dL   BUN 65 (H) 6 - 20 mg/dL   Creatinine, Ser 4.28 (H) 0.44 - 1.00 mg/dL   Calcium 8.7 (L) 8.9 - 10.3 mg/dL   Total Protein 5.6 (L) 6.5 - 8.1 g/dL   Albumin 2.6 (L) 3.5 - 5.0 g/dL   AST 60 (H) 15 -  41 U/L   ALT 34 14 - 54 U/L   Alkaline Phosphatase 125 38 - 126 U/L   Total Bilirubin 1.0 0.3 - 1.2 mg/dL   GFR calc non Af Amer 9 (L) >60 mL/min   GFR calc Af Amer 11 (L) >60 mL/min    Comment: (NOTE) The eGFR has been calculated using the CKD EPI equation. This calculation has not been validated in all clinical situations. eGFR's persistently <60 mL/min signify possible Chronic Kidney Disease.    Anion gap 9 5 - 15  Glucose, capillary     Status: None   Collection Time: 03/12/17  3:55 PM  Result Value Ref Range   Glucose-Capillary 88 65 - 99 mg/dL  MRSA PCR Screening     Status: None   Collection Time: 03/12/17  4:11 PM  Result Value Ref Range   MRSA by PCR NEGATIVE NEGATIVE    Comment:        The GeneXpert MRSA Assay (FDA approved for NASAL specimens only), is one component of a comprehensive MRSA  colonization surveillance program. It is not intended to diagnose MRSA infection nor to guide or monitor treatment for MRSA infections.   Glucose, capillary     Status: Abnormal   Collection Time: 03/12/17  9:39 PM  Result Value Ref Range   Glucose-Capillary 104 (H) 65 - 99 mg/dL  Comprehensive metabolic panel     Status: Abnormal   Collection Time: 03/13/17  5:57 AM  Result Value Ref Range   Sodium 126 (L) 135 - 145 mmol/L   Potassium 4.7 3.5 - 5.1 mmol/L   Chloride 100 (L) 101 - 111 mmol/L   CO2 19 (L) 22 - 32 mmol/L   Glucose, Bld 79 65 - 99 mg/dL   BUN 57 (H) 6 - 20 mg/dL   Creatinine, Ser 3.33 (H) 0.44 - 1.00 mg/dL   Calcium 8.0 (L) 8.9 - 10.3 mg/dL   Total Protein 4.8 (L) 6.5 - 8.1 g/dL   Albumin 2.2 (L) 3.5 - 5.0 g/dL   AST 54 (H) 15 - 41 U/L   ALT 30 14 - 54 U/L   Alkaline Phosphatase 108 38 - 126 U/L   Total Bilirubin 1.0 0.3 - 1.2 mg/dL   GFR calc non Af Amer 13 (L) >60 mL/min   GFR calc Af Amer 15 (L) >60 mL/min    Comment: (NOTE) The eGFR has been calculated using the CKD EPI equation. This calculation has not been validated in all clinical situations. eGFR's persistently <60 mL/min signify possible Chronic Kidney Disease.    Anion gap 7 5 - 15  CBC     Status: Abnormal   Collection Time: 03/13/17  5:57 AM  Result Value Ref Range   WBC 6.2 4.0 - 10.5 K/uL   RBC 2.68 (L) 3.87 - 5.11 MIL/uL   Hemoglobin 8.7 (L) 12.0 - 15.0 g/dL   HCT 25.7 (L) 36.0 - 46.0 %   MCV 95.9 78.0 - 100.0 fL   MCH 32.5 26.0 - 34.0 pg   MCHC 33.9 30.0 - 36.0 g/dL   RDW 15.9 (H) 11.5 - 15.5 %   Platelets 262 150 - 400 K/uL  Glucose, capillary     Status: None   Collection Time: 03/13/17  7:32 AM  Result Value Ref Range   Glucose-Capillary 78 65 - 99 mg/dL    ABGS No results for input(s): PHART, PO2ART, TCO2, HCO3 in the last 72 hours.  Invalid input(s): PCO2 CULTURES Recent Results (from the past  240 hour(s))  MRSA PCR Screening     Status: None   Collection Time: 03/12/17   4:11 PM  Result Value Ref Range Status   MRSA by PCR NEGATIVE NEGATIVE Final    Comment:        The GeneXpert MRSA Assay (FDA approved for NASAL specimens only), is one component of a comprehensive MRSA colonization surveillance program. It is not intended to diagnose MRSA infection nor to guide or monitor treatment for MRSA infections.    Studies/Results: No results found.  Medications:  Prior to Admission:  Prescriptions Prior to Admission  Medication Sig Dispense Refill Last Dose  . acetaminophen (TYLENOL) 325 MG tablet Take 2 tablets (650 mg total) by mouth every 6 (six) hours as needed for mild pain (or Fever >/= 101).   03/11/2017 at Unknown time  . albuterol (PROVENTIL HFA;VENTOLIN HFA) 108 (90 Base) MCG/ACT inhaler Inhale 2 puffs into the lungs 4 (four) times daily.    unknown  . allopurinol (ZYLOPRIM) 300 MG tablet Take 0.5 tablets (150 mg total) by mouth daily.   03/12/2017 at Unknown time  . ALPRAZolam (XANAX) 0.25 MG tablet Take 1 tablet (0.25 mg total) by mouth 2 (two) times daily as needed for anxiety. 60 tablet 0 unknown  . apixaban (ELIQUIS) 5 MG TABS tablet Take 1 tablet (5 mg total) by mouth 2 (two) times daily. 60 tablet 5 03/12/2017 at 1000  . Balsam [place]-Castor Oil (VENELEX) OINT Apply 1 application topically See admin instructions. Apply to sacrum and bilateral buttocks every shift and as needed.   03/11/2017 at Unknown time  . calcium carbonate (TUMS - DOSED IN MG ELEMENTAL CALCIUM) 500 MG chewable tablet Chew 1 tablet by mouth 2 (two) times daily.   03/12/2017 at Unknown time  . cetirizine (ZYRTEC) 10 MG tablet Take 10 mg by mouth daily.   03/12/2017 at Unknown time  . cholecalciferol (VITAMIN D) 1000 units tablet Take 1,000 Units by mouth every morning.   03/12/2017 at Unknown time  . colchicine 0.6 MG tablet Take 1 tablet (0.6 mg total) by mouth 2 (two) times daily.   03/12/2017 at Unknown time  . diltiazem (CARDIZEM CD) 120 MG 24 hr capsule Take 1 capsule (120 mg  total) by mouth daily. 90 capsule 3 03/12/2017 at Unknown time  . escitalopram (LEXAPRO) 10 MG tablet Take 10 mg by mouth daily.   03/12/2017 at Unknown time  . feeding supplement, GLUCERNA SHAKE, (GLUCERNA SHAKE) LIQD Take 237 mLs by mouth 3 (three) times daily between meals.  0 03/12/2017 at Unknown time  . fluticasone furoate-vilanterol (BREO ELLIPTA) 200-25 MCG/INH AEPB Inhale 1 puff into the lungs daily.   03/12/2017 at Unknown time  . gabapentin (NEURONTIN) 100 MG capsule Take 100 mg by mouth 2 (two) times daily.   03/12/2017 at Unknown time  . HYDROcodone-acetaminophen (NORCO) 7.5-325 MG tablet Take 1 tablet by mouth 3 (three) times daily as needed for moderate pain.   Past Week at Unknown time  . insulin glargine (LANTUS) 100 UNIT/ML injection Inject 0.2 mLs (20 Units total) into the skin daily. 10 mL 11 03/11/2017 at Unknown time  . insulin lispro (HUMALOG) 100 UNIT/ML injection Inject 3-20 Units into the skin 3 (three) times daily before meals. 121-150=1 units 151-200=2 units 201-250=3 units 251-300=5units 301-350=7units 351-400=9units   03/12/2017 at Unknown time  . levothyroxine (SYNTHROID, LEVOTHROID) 137 MCG tablet Take 137 mcg by mouth daily before breakfast.   03/12/2017 at Unknown time  . loperamide (IMODIUM A-D) 2 MG  tablet Take 2 mg by mouth 4 (four) times daily as needed for diarrhea or loose stools.     . metoCLOPramide (REGLAN) 10 MG tablet Take 0.5 tablets (5 mg total) by mouth every 6 (six) hours as needed for nausea (nausea). 6 tablet 0 03/12/2017 at Unknown time  . metoprolol tartrate (LOPRESSOR) 25 MG tablet TAKE 1/2 TABLET BY MOUTH TWICE DAILY 90 tablet 1 03/12/2017 at 1000  . Multiple Vitamin (MULTIVITAMIN WITH MINERALS) TABS tablet Take 1 tablet by mouth daily.   03/12/2017 at Unknown time  . omeprazole (PRILOSEC) 20 MG capsule TAKE ONE CAPSULE BY MOUTH DAILY 90 capsule 3 03/11/2017 at Unknown time  . ondansetron (ZOFRAN) 4 MG tablet Take 1 tablet (4 mg total) by mouth every 6  (six) hours as needed for nausea. 20 tablet 0 03/12/2017 at Unknown time  . polyvinyl alcohol (LIQUIFILM TEARS) 1.4 % ophthalmic solution Place 1 drop into both eyes 4 (four) times daily as needed for dry eyes. 15 mL 0 03/11/2017 at Unknown time  . promethazine (PHENERGAN) 12.5 MG tablet Take 12.5 mg by mouth every 6 (six) hours as needed for nausea or vomiting.   03/12/2017 at Unknown time  . triamcinolone (NASACORT ALLERGY 24HR) 55 MCG/ACT AERO nasal inhaler Place 2 sprays into the nose daily.     . Doxepin HCl (SILENOR) 6 MG TABS Take 1 tablet (6 mg total) by mouth at bedtime as needed. 30 tablet  Past Week at Unknown time  . potassium chloride SA (K-DUR,KLOR-CON) 20 MEQ tablet Take 1 tablet (20 mEq total) by mouth daily. (Patient not taking: Reported on 03/12/2017)   Not Taking at Unknown time  . torsemide (DEMADEX) 20 MG tablet Take 1 tablet (20 mg total) by mouth 2 (two) times daily. (may take an extra 79m as needed) (Patient not taking: Reported on 03/12/2017)   Not Taking at Unknown time   Scheduled: . allopurinol  150 mg Oral Daily  . escitalopram  10 mg Oral Daily  . feeding supplement (GLUCERNA SHAKE)  237 mL Oral TID BM  . fluticasone furoate-vilanterol  1 puff Inhalation Daily  . gabapentin  100 mg Oral BID  . heparin  5,000 Units Subcutaneous Q8H  . insulin aspart  0-15 Units Subcutaneous TID WC  . insulin aspart  0-5 Units Subcutaneous QHS  . insulin glargine  20 Units Subcutaneous Daily  . levothyroxine  137 mcg Oral QAC breakfast  . metoprolol tartrate  12.5 mg Oral BID  . pantoprazole  40 mg Oral Daily   Continuous: . sodium chloride 125 mL/hr at 03/13/17 0605   PQPY:[id number]**OR** acetaminophen, ALPRAZolam, HYDROcodone-acetaminophen, metoCLOPramide, ondansetron **OR** ondansetron (ZOFRAN) IV  Assesment: She was admitted with dehydration. She is hyponatremic. She has acute kidney injury. She has diarrhea and may have C. difficile and I'm going to get GI help to try to  figure this out. Her renal functions a little better. Her sodium is a little better.  At baseline she has COPD which is stable  At baseline she has paroxysmal atrial fibrillation she is chronically anticoagulated for that and this is unchanged  She has diabetes and she is on sliding scale  She has had problems with gout and had previously not tolerated allopurinol but she has been able to take that at the nursing home  She has chronic diastolic heart failure so we have to be careful about her IV fluids but I think right now she's dehydrated and needs to continue IV fluids  She's had  trouble with anxiety and depression   Active Problems:   GERD (gastroesophageal reflux disease)   COPD (chronic obstructive pulmonary disease) (HCC)   Hypothyroidism   PAF (paroxysmal atrial fibrillation) (HCC)   Hyponatremia   AKI (acute kidney injury) ([place])   Diabetes mellitus type 2 in obese ([place])   Diarrhea    Plan: Continue treatments. Check C. difficile. Request GI consultation.    LOS: 1 day   Ebrima Ranta L 03/13/2017, 8:30 AM

## 2017-03-13 NOTE — Consult Note (Signed)
Referring Provider: Kari Baars, MD  Primary Care Physician:  Kari Baars, MD Primary Gastroenterologist:  Dr. Karilyn Cota  Reason for Consultation:    Diarrhea and anemia.  HPI:   Patient is 76 year old Caucasian female with multiple medical problems who presented to emergency room yesterday afternoon with diarrhea nausea and vomiting. She also also noted profound weakness and dysuria. Evaluation in emergency room revealed hyponatremia with serum sodium 120 and serum creatinine of 4.2. Stool studies were ordered. She was felt to be dehydrated. She was hospitalized and begun on IV fluids. Stool studies have not been completed. Patient states she has had diarrhea for about 2 months but was mild until over the weekend when she had 7 or 8 loose watery stools and she also had multiple episodes of vomiting. She states she was treated with antibiotic 7 weeks ago but does not remember the name. She noted mild lower abdominal cramping over the weekend but does not have any pain this afternoon. She states she did eat some of her lunch. She was served with modified carb diet. He is on Apixaban for paroxysmal atrial fibrillation. She does not take aspirin or other OTC NSAIDs. She underwent EGD with ED during hospitalization 5 weeks ago. She was noted to have Schatzki's ring. That is esophagus was not identified.  She has history of leg cellulitis and has been treated with clindamycin about 6 months ago and again 2 months ago.  She also has history of iron deficiency anemia dating back to December 2010 when she presented with hemoglobin of 7.7 g. She had been taking ibuprofen. She received 2 units of PRBCs. She had EGD revealing small sliding hiatal hernia short segment Barrett's confirmed histologically and gastric erosion. H. pylori serology was negative. 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 She received iron infusion in  September last year and responded well.  She does not smoke cigarettes or drink alcohol. She does not have any children. She has 4 sisters and 2 brothers. 2 brothers and one sister are diabetic. Father died of lung carcinoma at age 34 and mother lived to be in her 61s.   Past Medical History:  Diagnosis Date  . Barrett esophagus.Diagnoses in 2010. EGD 5 weeks ago did not reveal any changes of Barrett's.    . Bronchitis   . C. difficile colitis.She was treated with vancomycin for about a week about 2 months ago. C. difficile antigen was positive but not the toxin.        . Cellulitis   . CHF (congestive heart failure) (HCC)   . COPD (chronic obstructive pulmonary disease) (HCC)   . Diabetes mellitus without complication (HCC)   . Diastolic heart failure (HCC)   . Difficulty walking   . Dysphagia   . Essential hypertension   . Gastritis   . GERD (gastroesophageal reflux disease)   . Gout   . Hepatomegaly    ???  . History of falling   . Hyponatremia   . Hypothyroidism   . Iron deficiency anemia   . Muscle weakness   . PAF (paroxysmal atrial fibrillation) (HCC)    Eliquis  . Sleep apnea   . Stage III chronic kidney disease     Past Surgical History:  Procedure Laterality Date  . BREAST BIOPSY Right Sept, 2012  . CHOLECYSTECTOMY    . COLONOSCOPY    . ESOPHAGEAL DILATION N/A 02/05/2017   Procedure: ESOPHAGEAL DILATION;  Surgeon: Malissa Hippo,  MD;  Location: [facility];  Service: Endoscopy;  Laterality: N/A;  . ESOPHAGOGASTRODUODENOSCOPY N/A 02/05/2017   Procedure: ESOPHAGOGASTRODUODENOSCOPY (EGD);  Surgeon: Malissa Hippoehman, Martine Bleecker U, MD;  Location: [facility];  Service: Endoscopy;  Laterality: N/A;  . UPPER GASTROINTESTINAL ENDOSCOPY      Prior to Admission medications   Medication Sig Start Date End Date Taking? Authorizing Provider  acetaminophen (TYLENOL) 325 MG tablet Take 2 tablets (650 mg total) by mouth every 6 (six) hours as needed for mild pain (or Fever >/=  101). 01/07/17  Yes Kari BaarsHawkins, Edward, MD  albuterol (PROVENTIL HFA;VENTOLIN HFA) 108 (90 Base) MCG/ACT inhaler Inhale 2 puffs into the lungs 4 (four) times daily.    Yes [provider]  allopurinol (ZYLOPRIM) 300 MG tablet Take 0.5 tablets (150 mg total) by mouth daily. 02/07/17  Yes Kari BaarsHawkins, Edward, MD  ALPRAZolam Prudy Feeler(XANAX) 0.25 MG tablet Take 1 tablet (0.25 mg total) by mouth 2 (two) times daily as needed for anxiety. 02/06/17 02/06/18 Yes Kari BaarsHawkins, Edward, MD  apixaban (ELIQUIS) 5 MG TABS tablet Take 1 tablet (5 mg total) by mouth 2 (two) times daily. 11/13/14  Yes Kari BaarsHawkins, Edward, MD  [facility](VENELEX) OINT Apply 1 application topically See admin instructions. Apply to sacrum and bilateral buttocks every shift and as needed.   Yes [provider]  calcium carbonate (TUMS - DOSED IN MG ELEMENTAL CALCIUM) 500 MG chewable tablet Chew 1 tablet by mouth 2 (two) times daily.   Yes [provider]  cetirizine (ZYRTEC) 10 MG tablet Take 10 mg by mouth daily.   Yes [provider]  cholecalciferol (VITAMIN D) 1000 units tablet Take 1,000 Units by mouth every morning.   Yes [provider]  colchicine 0.6 MG tablet Take 1 tablet (0.6 mg total) by mouth 2 (two) times daily. 02/06/17  Yes Kari BaarsHawkins, Edward, MD  diltiazem (CARDIZEM CD) 120 MG 24 hr capsule Take 1 capsule (120 mg total) by mouth daily. 01/26/16  Yes Laqueta LindenKoneswaran, Suresh A, MD  escitalopram (LEXAPRO) 10 MG tablet Take 10 mg by mouth daily.   Yes [provider]  feeding supplement, GLUCERNA SHAKE, (GLUCERNA SHAKE) LIQD Take 237 mLs by mouth 3 (three) times daily between meals. 01/07/17  Yes Kari BaarsHawkins, Edward, MD  fluticasone furoate-vilanterol (BREO ELLIPTA) 200-25 MCG/INH AEPB Inhale 1 puff into the lungs daily.   Yes [provider]  gabapentin (NEURONTIN) 100 MG capsule Take 100 mg by mouth 2 (two) times daily.   Yes [provider]  HYDROcodone-acetaminophen (NORCO) 7.5-325  MG tablet Take 1 tablet by mouth 3 (three) times daily as needed for moderate pain.   Yes [provider]  insulin glargine (LANTUS) 100 UNIT/ML injection Inject 0.2 mLs (20 Units total) into the skin daily. 01/07/17  Yes Kari BaarsHawkins, Edward, MD  insulin lispro (HUMALOG) 100 UNIT/ML injection Inject 3-20 Units into the skin 3 (three) times daily before meals. 121-150=1 units 151-200=2 units 201-250=3 units 251-300=5units 301-350=7units 351-400=9units   Yes [provider]  levothyroxine (SYNTHROID, LEVOTHROID) 137 MCG tablet Take 137 mcg by mouth daily before breakfast.   Yes [provider]  loperamide (IMODIUM A-D) 2 MG tablet Take 2 mg by mouth 4 (four) times daily as needed for diarrhea or loose stools.   Yes [provider]  metoCLOPramide (REGLAN) 10 MG tablet Take 0.5 tablets (5 mg total) by mouth every 6 (six) hours as needed for nausea (nausea). 02/10/17  Yes Doug SouJacubowitz, Sam, MD  metoprolol tartrate (LOPRESSOR) 25 MG tablet TAKE  1/2 TABLET BY MOUTH TWICE DAILY 10/23/16  Yes Laqueta Linden, MD  Multiple Vitamin (MULTIVITAMIN WITH MINERALS) TABS tablet Take 1 tablet by mouth daily.   Yes [provider]  omeprazole (PRILOSEC) 20 MG capsule TAKE ONE CAPSULE BY MOUTH DAILY 05/29/16  Yes Iqra Rotundo, Joline Maxcy, MD  ondansetron (ZOFRAN) 4 MG tablet Take 1 tablet (4 mg total) by mouth every 6 (six) hours as needed for nausea. 01/07/17  Yes Kari Baars, MD  polyvinyl alcohol (LIQUIFILM TEARS) 1.4 % ophthalmic solution Place 1 drop into both eyes 4 (four) times daily as needed for dry eyes. 01/07/17  Yes Kari Baars, MD  promethazine (PHENERGAN) 12.5 MG tablet Take 12.5 mg by mouth every 6 (six) hours as needed for nausea or vomiting.   Yes [provider]  triamcinolone (NASACORT ALLERGY 24HR) 55 MCG/ACT AERO nasal inhaler Place 2 sprays into the nose daily.   Yes [provider]  Doxepin HCl (SILENOR) 6 MG TABS Take 1 tablet (6 mg total)  by mouth at bedtime as needed. 02/06/17   Kari Baars, MD  potassium chloride SA (K-DUR,KLOR-CON) 20 MEQ tablet Take 1 tablet (20 mEq total) by mouth daily. Patient not taking: Reported on 03/12/2017 12/10/16   Antoine Poche, MD  torsemide (DEMADEX) 20 MG tablet Take 1 tablet (20 mg total) by mouth 2 (two) times daily. (may take an extra 20mg  as needed) Patient not taking: Reported on 03/12/2017 01/07/17   Kari Baars, MD    Current Facility-Administered Medications  Medication Dose Route Frequency Provider Last Rate Last Dose  . 0.9 %  sodium chloride infusion   Intravenous Continuous Erick Blinks, MD 125 mL/hr at 03/13/17 0605    . acetaminophen (TYLENOL) tablet 650 mg  650 mg Oral Q6H PRN Erick Blinks, MD       Or  . acetaminophen (TYLENOL) suppository 650 mg  650 mg Rectal Q6H PRN Erick Blinks, MD      . allopurinol (ZYLOPRIM) tablet 150 mg  150 mg Oral Daily Erick Blinks, MD   150 mg at 03/13/17 0958  . ALPRAZolam (XANAX) tablet 0.25 mg  0.25 mg Oral BID PRN Erick Blinks, MD      . escitalopram (LEXAPRO) tablet 10 mg  10 mg Oral Daily Erick Blinks, MD   10 mg at 03/13/17 0958  . feeding supplement (GLUCERNA SHAKE) (GLUCERNA SHAKE) liquid 237 mL  237 mL Oral TID BM Erick Blinks, MD   237 mL at 03/13/17 1000  . fluticasone furoate-vilanterol (BREO ELLIPTA) 200-25 MCG/INH 1 puff  1 puff Inhalation Daily Erick Blinks, MD   1 puff at 03/13/17 0816  . gabapentin (NEURONTIN) capsule 100 mg  100 mg Oral BID Erick Blinks, MD   100 mg at 03/13/17 0958  . heparin injection 5,000 Units  5,000 Units Subcutaneous Q8H Erick Blinks, MD   5,000 Units at 03/13/17 0605  . HYDROcodone-acetaminophen (NORCO) 7.5-325 MG per tablet 1 tablet  1 tablet Oral TID PRN Erick Blinks, MD      . insulin aspart (novoLOG) injection 0-15 Units  0-15 Units Subcutaneous TID WC Memon, Jehanzeb, MD      . insulin aspart (novoLOG) injection 0-5 Units  0-5 Units Subcutaneous QHS Memon,  Jehanzeb, MD      . insulin glargine (LANTUS) injection 20 Units  20 Units Subcutaneous Daily Erick Blinks, MD   20 Units at 03/13/17 0959  . levothyroxine (SYNTHROID, LEVOTHROID) tablet 137 mcg  137 mcg Oral QAC breakfast Erick Blinks, MD  137 mcg at 03/13/17 0958  . metoCLOPramide (REGLAN) tablet 5 mg  5 mg Oral Q6H PRN Erick Blinks, MD      . metoprolol tartrate (LOPRESSOR) tablet 12.5 mg  12.5 mg Oral BID Erick Blinks, MD   12.5 mg at 03/13/17 0958  . ondansetron (ZOFRAN) tablet 4 mg  4 mg Oral Q6H PRN Erick Blinks, MD       Or  . ondansetron (ZOFRAN) injection 4 mg  4 mg Intravenous Q6H PRN Erick Blinks, MD      . pantoprazole (PROTONIX) EC tablet 40 mg  40 mg Oral Daily Erick Blinks, MD   40 mg at 03/13/17 0958  . vancomycin (VANCOCIN) 50 mg/mL oral solution 250 mg  250 mg Oral Q6H Barnes Florek, Joline Maxcy, MD        Allergies as of 03/12/2017 - Review Complete 03/12/2017  Allergen Reaction Noted  . Ciprofloxacin Shortness Of Breath   . Doxycycline Nausea Only 12/15/2014  . Fish oil  11/13/2011  . Guaifenesin Shortness Of Breath   . Mucinex [guaifenesin er] Shortness Of Breath 12/27/2016  . Sulfamethoxazole w/trimethoprim 800-160 [sulfamethoxazole-trimethoprim] Nausea Only 12/15/2014  . Uloric [febuxostat] Swelling 01/26/2016  . Adhesive [tape] Other (See Comments) 11/06/2014  . Allopurinol Other (See Comments) 05/25/2016  . Cefuroxime axetil Swelling   . Metolazone Nausea Only 11/07/2016  . Tramadol  09/03/2013    Family History  Problem Relation Age of Onset  . Dementia Mother   . Lung cancer Father        smoked  . Hypertension Sister   . Diabetes Brother   . Obesity Sister   . Hypertension Sister   . Restless legs syndrome Sister   . Healthy Sister   . Lung cancer Maternal Uncle     Social History   Social History  . Marital status: Widowed    Spouse name: N/A  . Number of children: N/A  . Years of education: N/A   Occupational History  . Not  on file.   Social History Main Topics  . Smoking status: Former Smoker    Packs/day: 0.50    Years: 30.00    Types: Cigarettes    Start date: 09/24/1958    Quit date: 11/13/1999  . Smokeless tobacco: Never Used  . Alcohol use No  . Drug use: No  . Sexual activity: Not Currently   Other Topics Concern  . Not on file   Social History Narrative  . No narrative on file    Review of Systems: See HPI, otherwise normal ROS  Physical Exam: Temp:  [97.5 F (36.4 C)-97.8 F (36.6 C)] 97.5 F (36.4 C) (06/20 1430) Pulse Rate:  [83-86] 83 (06/20 1430) Resp:  [16-20] 20 (06/20 1430) BP: (93-129)/(36-52) 96/46 (06/20 1430) SpO2:  [94 %-98 %] 97 % (06/20 1430) Last BM Date: 03/13/17 Patient is alert and responds appropriately to questions. She has round facies. Conjunctiva is pale. Sclerae nonicteric. Oropharyngeal mucosa is normal. No neck masses or thyromegaly noted. Cardiac exam with regular rhythm normal S1 and S2. No murmur or gallop noted. Lungs are clear to auscultation. Abdomen is full. Bowel sounds are hyperactive. On palpation abdomen is soft and nontender without organomegaly or masses. She does not have clubbing or peripheral edema. She has mild flushing to skin of left leg.   Lab Results:  Recent Labs  03/12/17 0717 03/12/17 1136 03/13/17 0557  WBC 7.0 7.9 6.2  HGB 9.1* 9.9* 8.7*  HCT 25.9* 29.2* 25.7*  PLT 269 286  262   BMET  Recent Labs  03/12/17 0717 03/12/17 1136 03/13/17 0557  NA 120* 120* 126*  K 4.7 4.8 4.7  CL 93* 91* 100*  CO2 20* 20* 19*  GLUCOSE 74 84 79  BUN 67* 65* 57*  CREATININE 4.24* 4.28* 3.33*  CALCIUM 8.4* 8.7* 8.0*   LFT  Recent Labs  03/13/17 0557  PROT 4.8*  ALBUMIN 2.2*  AST 54*  ALT 30  ALKPHOS 108  BILITOT 1.0   Stool studies pending.   Assessment;  Patient is 76 year old Caucasian female who presents with acute diarrhea associated with nausea vomiting hyponatremia. She also had higher than usual level of  serum creatinine felt to be due to dehydration. She has been treated with clindamycin twice in the last 6 months for leg cellulitis. She has tested positive for C. difficile antigen but negative for toxin which she was treated with vancomycin for about a week possibly 8 weeks ago with symptomatic improvement. Now she presents with worsening diarrhea and she has C. difficile colitis until proven otherwise. Will begin therapy while waiting for stool studies for confirmation.  Anemia. She has history of iron deficiency anemia dating back to December 2010. No evidence of overt GI bleed. Drop in H&H possibly due to acute illness. Will reconfirm iron deficiency before iron infusion considered.  Acute on chronic kidney disease. Significant improvement in renal function with hydration.   Recommendations;  Begin oral vancomycin at a dose of 250 mg by mouth every 6 while waiting for stool studies. Iron studies include iron TIBC and ferritin.    LOS: 1 day   Melah Ebling  03/13/2017, 5:07 PM

## 2017-03-13 NOTE — Clinical Social Work Note (Signed)
Clinical Social Work Assessment  Patient Details  Name: Yolanda [name] MRN: [id number] Date of Birth: 25-Apr-1941  Date of referral:  03/13/17               Reason for consult:  Discharge Planning                Permission sought to share information with:    Permission granted to share information::     Name::        Agency::     Relationship::     Contact Information:     Housing/Transportation Living arrangements for the past 2 months:  Skilled Nursing Facility Source of Information:  Facility Patient Interpreter Needed:  None Criminal Activity/Legal Involvement Pertinent to Current Situation/Hospitalization:  No - Comment as needed Significant Relationships:  Siblings Lives with:  Facility Resident Do you feel safe going back to the place where you live?  Yes Need for family participation in patient care:  Yes (Comment)  Care giving concerns:  Facility resident.    Social Worker assessment / plan:  [facility]. Prior to that she was in the hospital for a week [facility]  Patient is in SNF for rehab. She plans on returning if needed to complete rehab at discharge. Patient's sister, Yolanda [name], was at bedside. She and patient provided historical information. [facility] indicated that patient's managed care provider previously issued a non coverage letter to patient and her family appealed and won.  She stated [facility] due to lack of participation.  Lynnea FerrierKerri stated that patient's family desires for her to return to the facility at discharge because they feel she was not participating because she was sick not due to lack of motivation.  Patient will need a PT evaluaiton, Blue Medicare Authorization.  At baseline patient in independent in all ADLs and drives.   Employment status:  Retired Database administratornsurance information:  Managed Medicare PT Recommendations:  Not assessed at this time Information /  Referral to community resources:     Patient/Family's Response to care:  [facility].  Patient/Family's Understanding of and Emotional Response to Diagnosis, Current Treatment, and Prognosis:  Patient understands her diagnosis, treatment and prognosis.   Emotional Assessment Appearance:  Appears stated age Attitude/Demeanor/Rapport:    Affect (typically observed):  Accepting, Calm Orientation:  Oriented to Self, Oriented to Place, Oriented to  Time, Oriented to Situation Alcohol / Substance use:  Not Applicable Psych involvement (Current and /or in the community):     Discharge Needs  Concerns to be addressed:  Discharge Planning Concerns Readmission within the last 30 days:  No Current discharge risk:  None Barriers to Discharge:  No Barriers Identified   Annice NeedySettle, Darah Simkin D, LCSW 03/13/2017, 12:54 PM

## 2017-03-14 LAB — URINALYSIS, ROUTINE W REFLEX MICROSCOPIC
Bilirubin Urine: NEGATIVE
GLUCOSE, UA: NEGATIVE mg/dL
Ketones, ur: NEGATIVE mg/dL
Nitrite: NEGATIVE
PH: 5 (ref 5.0–8.0)
PROTEIN: NEGATIVE mg/dL
SPECIFIC GRAVITY, URINE: 1.005 (ref 1.005–1.030)

## 2017-03-14 LAB — GLUCOSE, CAPILLARY
GLUCOSE-CAPILLARY: 77 mg/dL (ref 65–99)
Glucose-Capillary: 71 mg/dL (ref 65–99)
Glucose-Capillary: 76 mg/dL (ref 65–99)
Glucose-Capillary: 74 mg/dL (ref 65–99)

## 2017-03-14 LAB — FERRITIN: FERRITIN: 1116 ng/mL — AB (ref 11–307)

## 2017-03-14 LAB — IRON AND TIBC
IRON: 119 ug/dL (ref 28–170)
Saturation Ratios: 91 % — ABNORMAL HIGH (ref 10.4–31.8)
TIBC: 130 ug/dL — AB (ref 250–450)
UIBC: 11 ug/dL

## 2017-03-14 LAB — BASIC METABOLIC PANEL
Anion gap: 7 (ref 5–15)
BUN: 50 mg/dL — AB (ref 6–20)
CALCIUM: 7.6 mg/dL — AB (ref 8.9–10.3)
CO2: 17 mmol/L — ABNORMAL LOW (ref 22–32)
CREATININE: 2.64 mg/dL — AB (ref 0.44–1.00)
Chloride: 104 mmol/L (ref 101–111)
GFR calc non Af Amer: 17 mL/min — ABNORMAL LOW (ref >60)
GFR, EST AFRICAN AMERICAN: 19 mL/min — AB (ref >60)
Glucose, Bld: 72 mg/dL (ref 65–99)
Potassium: 4.2 mmol/L (ref 3.5–5.1)
SODIUM: 128 mmol/L — AB (ref 135–145)

## 2017-03-14 LAB — CLOSTRIDIUM DIFFICILE BY PCR: CDIFFPCR: NEGATIVE

## 2017-03-14 LAB — CBC WITH DIFFERENTIAL/PLATELET
Basophils Absolute: 0 10*3/uL (ref 0.0–0.1)
Basophils Relative: 1 %
EOS ABS: 0.3 10*3/uL (ref 0.0–0.7)
Eosinophils Relative: 5 %
HCT: 23.4 % — ABNORMAL LOW (ref 36.0–46.0)
Hemoglobin: 7.9 g/dL — ABNORMAL LOW (ref 12.0–15.0)
LYMPHS ABS: 1.8 10*3/uL (ref 0.7–4.0)
Lymphocytes Relative: 37 %
MCH: 32.4 pg (ref 26.0–34.0)
MCHC: 33.8 g/dL (ref 30.0–36.0)
MCV: 95.9 fL (ref 78.0–100.0)
MONO ABS: 0.5 10*3/uL (ref 0.1–1.0)
MONOS PCT: 10 %
Neutro Abs: 2.3 10*3/uL (ref 1.7–7.7)
Neutrophils Relative %: 47 %
Platelets: 257 10*3/uL (ref 150–400)
RBC: 2.44 MIL/uL — ABNORMAL LOW (ref 3.87–5.11)
RDW: 15.9 % — AB (ref 11.5–15.5)
WBC: 4.9 10*3/uL (ref 4.0–10.5)

## 2017-03-14 MED ORDER — NYSTATIN-TRIAMCINOLONE 100000-0.1 UNIT/GM-% EX CREA
TOPICAL_CREAM | Freq: Two times a day (BID) | CUTANEOUS | Status: DC
  Administered 2017-03-14 – 2017-03-20 (×12): via TOPICAL
  Filled 2017-03-14 (×2): qty 15

## 2017-03-14 NOTE — Progress Notes (Signed)
Subjective: She says she feels better. No new complaints. She has less diarrhea. Help from GI noted and appreciated. She still appears slightly dry. No other new complaints. No cough or congestion. No significant shortness of breath. She is still very weak  Objective: Vital signs in last 24 hours: Temp:  [97.5 F (36.4 C)-98.4 F (36.9 C)] 98.4 F (36.9 C) (06/21 0615) Pulse Rate:  [82-89] 82 (06/21 0615) Resp:  [20] 20 (06/21 0615) BP: (96-112)/(32-50) 112/50 (06/21 0615) SpO2:  [95 %-97 %] 95 % (06/21 0615) Weight:  [88.4 kg (194 lb 14.2 oz)] 88.4 kg (194 lb 14.2 oz) (06/21 0615) Weight change: -0.051 kg (-1.8 oz) Last BM Date: 03/13/17  Intake/Output from previous day: 06/20 0701 - 06/21 0700 In: 480 [P.O.:480] Out: -   PHYSICAL EXAM General appearance: alert, cooperative, mild distress and morbidly obese Resp: clear to auscultation bilaterally Cardio: regular rate and rhythm, S1, S2 normal, no murmur, click, rub or gallop GI: Mildly diffusely tender with mildly hyperactive bowel sounds Extremities: extremities normal, atraumatic, no cyanosis or edema Skin warm and dry. Mucous membranes are moist  Lab Results:  Results for orders placed or performed during the hospital encounter of 03/12/17 (from the past 48 hour(s))  CBC with Differential     Status: Abnormal   Collection Time: 03/12/17 11:36 AM  Result Value Ref Range   WBC 7.9 4.0 - 10.5 K/uL   RBC 3.08 (L) 3.87 - 5.11 MIL/uL   Hemoglobin 9.9 (L) 12.0 - 15.0 g/dL   HCT 29.2 (L) 36.0 - 46.0 %   MCV 94.8 78.0 - 100.0 fL   MCH 32.1 26.0 - 34.0 pg   MCHC 33.9 30.0 - 36.0 g/dL   RDW 16.0 (H) 11.5 - 15.5 %   Platelets 286 150 - 400 K/uL   Neutrophils Relative % 56 %   Neutro Abs 4.4 1.7 - 7.7 K/uL   Lymphocytes Relative 31 %   Lymphs Abs 2.4 0.7 - 4.0 K/uL   Monocytes Relative 9 %   Monocytes Absolute 0.7 0.1 - 1.0 K/uL   Eosinophils Relative 5 %   Eosinophils Absolute 0.4 0.0 - 0.7 K/uL   Basophils Relative 1 %    Basophils Absolute 0.0 0.0 - 0.1 K/uL  Comprehensive metabolic panel     Status: Abnormal   Collection Time: 03/12/17 11:36 AM  Result Value Ref Range   Sodium 120 (L) 135 - 145 mmol/L   Potassium 4.8 3.5 - 5.1 mmol/L   Chloride 91 (L) 101 - 111 mmol/L   CO2 20 (L) 22 - 32 mmol/L   Glucose, Bld 84 65 - 99 mg/dL   BUN 65 (H) 6 - 20 mg/dL   Creatinine, Ser 4.28 (H) 0.44 - 1.00 mg/dL   Calcium 8.7 (L) 8.9 - 10.3 mg/dL   Total Protein 5.6 (L) 6.5 - 8.1 g/dL   Albumin 2.6 (L) 3.5 - 5.0 g/dL   AST 60 (H) 15 - 41 U/L   ALT 34 14 - 54 U/L   Alkaline Phosphatase 125 38 - 126 U/L   Total Bilirubin 1.0 0.3 - 1.2 mg/dL   GFR calc non Af Amer 9 (L) >60 mL/min   GFR calc Af Amer 11 (L) >60 mL/min    Comment: (NOTE) The eGFR has been calculated using the CKD EPI equation. This calculation has not been validated in all clinical situations. eGFR's persistently <60 mL/min signify possible Chronic Kidney Disease.    Anion gap 9 5 - 15  C  difficile quick scan w PCR reflex     Status: Abnormal   Collection Time: 03/12/17  2:01 PM  Result Value Ref Range   C Diff antigen POSITIVE (A) NEGATIVE   C Diff toxin NEGATIVE NEGATIVE   C Diff interpretation Results are indeterminate. See PCR results.   Glucose, capillary     Status: None   Collection Time: 03/12/17  3:55 PM  Result Value Ref Range   Glucose-Capillary 88 65 - 99 mg/dL  MRSA PCR Screening     Status: None   Collection Time: 03/12/17  4:11 PM  Result Value Ref Range   MRSA by PCR NEGATIVE NEGATIVE    Comment:        The GeneXpert MRSA Assay (FDA approved for NASAL specimens only), is one component of a comprehensive MRSA colonization surveillance program. It is not intended to diagnose MRSA infection nor to guide or monitor treatment for MRSA infections.   Glucose, capillary     Status: Abnormal   Collection Time: 03/12/17  9:39 PM  Result Value Ref Range   Glucose-Capillary 104 (H) 65 - 99 mg/dL  Comprehensive metabolic  panel     Status: Abnormal   Collection Time: 03/13/17  5:57 AM  Result Value Ref Range   Sodium 126 (L) 135 - 145 mmol/L   Potassium 4.7 3.5 - 5.1 mmol/L   Chloride 100 (L) 101 - 111 mmol/L   CO2 19 (L) 22 - 32 mmol/L   Glucose, Bld 79 65 - 99 mg/dL   BUN 57 (H) 6 - 20 mg/dL   Creatinine, Ser 3.33 (H) 0.44 - 1.00 mg/dL   Calcium 8.0 (L) 8.9 - 10.3 mg/dL   Total Protein 4.8 (L) 6.5 - 8.1 g/dL   Albumin 2.2 (L) 3.5 - 5.0 g/dL   AST 54 (H) 15 - 41 U/L   ALT 30 14 - 54 U/L   Alkaline Phosphatase 108 38 - 126 U/L   Total Bilirubin 1.0 0.3 - 1.2 mg/dL   GFR calc non Af Amer 13 (L) >60 mL/min   GFR calc Af Amer 15 (L) >60 mL/min    Comment: (NOTE) The eGFR has been calculated using the CKD EPI equation. This calculation has not been validated in all clinical situations. eGFR's persistently <60 mL/min signify possible Chronic Kidney Disease.    Anion gap 7 5 - 15  CBC     Status: Abnormal   Collection Time: 03/13/17  5:57 AM  Result Value Ref Range   WBC 6.2 4.0 - 10.5 K/uL   RBC 2.68 (L) 3.87 - 5.11 MIL/uL   Hemoglobin 8.7 (L) 12.0 - 15.0 g/dL   HCT 25.7 (L) 36.0 - 46.0 %   MCV 95.9 78.0 - 100.0 fL   MCH 32.5 26.0 - 34.0 pg   MCHC 33.9 30.0 - 36.0 g/dL   RDW 15.9 (H) 11.5 - 15.5 %   Platelets 262 150 - 400 K/uL  Glucose, capillary     Status: None   Collection Time: 03/13/17  7:32 AM  Result Value Ref Range   Glucose-Capillary 78 65 - 99 mg/dL  Glucose, capillary     Status: None   Collection Time: 03/13/17 11:50 AM  Result Value Ref Range   Glucose-Capillary 76 65 - 99 mg/dL  Glucose, capillary     Status: None   Collection Time: 03/13/17  4:56 PM  Result Value Ref Range   Glucose-Capillary 88 65 - 99 mg/dL   Comment 1 Notify RN  Comment 2 Document in Chart   Glucose, capillary     Status: None   Collection Time: 03/13/17  8:27 PM  Result Value Ref Range   Glucose-Capillary 88 65 - 99 mg/dL   Comment 1 Notify RN    Comment 2 Document in Chart   CBC with  Differential/Platelet     Status: Abnormal   Collection Time: 03/14/17  5:41 AM  Result Value Ref Range   WBC 4.9 4.0 - 10.5 K/uL   RBC 2.44 (L) 3.87 - 5.11 MIL/uL   Hemoglobin 7.9 (L) 12.0 - 15.0 g/dL   HCT 54.2 (L) 37.0 - 23.0 %   MCV 95.9 78.0 - 100.0 fL   MCH 32.4 26.0 - 34.0 pg   MCHC 33.8 30.0 - 36.0 g/dL   RDW 17.2 (H) 09.1 - 06.8 %   Platelets 257 150 - 400 K/uL   Neutrophils Relative % 47 %   Neutro Abs 2.3 1.7 - 7.7 K/uL   Lymphocytes Relative 37 %   Lymphs Abs 1.8 0.7 - 4.0 K/uL   Monocytes Relative 10 %   Monocytes Absolute 0.5 0.1 - 1.0 K/uL   Eosinophils Relative 5 %   Eosinophils Absolute 0.3 0.0 - 0.7 K/uL   Basophils Relative 1 %   Basophils Absolute 0.0 0.0 - 0.1 K/uL  Basic metabolic panel     Status: Abnormal   Collection Time: 03/14/17  5:41 AM  Result Value Ref Range   Sodium 128 (L) 135 - 145 mmol/L   Potassium 4.2 3.5 - 5.1 mmol/L   Chloride 104 101 - 111 mmol/L   CO2 17 (L) 22 - 32 mmol/L   Glucose, Bld 72 65 - 99 mg/dL   BUN 50 (H) 6 - 20 mg/dL   Creatinine, Ser 1.66 (H) 0.44 - 1.00 mg/dL   Calcium 7.6 (L) 8.9 - 10.3 mg/dL   GFR calc non Af Amer 17 (L) >60 mL/min   GFR calc Af Amer 19 (L) >60 mL/min    Comment: (NOTE) The eGFR has been calculated using the CKD EPI equation. This calculation has not been validated in all clinical situations. eGFR's persistently <60 mL/min signify possible Chronic Kidney Disease.    Anion gap 7 5 - 15    ABGS No results for input(s): PHART, PO2ART, TCO2, HCO3 in the last 72 hours.  Invalid input(s): PCO2 CULTURES Recent Results (from the past 240 hour(s))  C difficile quick scan w PCR reflex     Status: Abnormal   Collection Time: 03/12/17  2:01 PM  Result Value Ref Range Status   C Diff antigen POSITIVE (A) NEGATIVE Final   C Diff toxin NEGATIVE NEGATIVE Final   C Diff interpretation Results are indeterminate. See PCR results.  Final  MRSA PCR Screening     Status: None   Collection Time: 03/12/17   4:11 PM  Result Value Ref Range Status   MRSA by PCR NEGATIVE NEGATIVE Final    Comment:        The GeneXpert MRSA Assay (FDA approved for NASAL specimens only), is one component of a comprehensive MRSA colonization surveillance program. It is not intended to diagnose MRSA infection nor to guide or monitor treatment for MRSA infections.    Studies/Results: No results found.  Medications:  Prior to Admission:  Prescriptions Prior to Admission  Medication Sig Dispense Refill Last Dose  . acetaminophen (TYLENOL) 325 MG tablet Take 2 tablets (650 mg total) by mouth every 6 (six) hours as needed for  mild pain (or Fever >/= 101).   03/11/2017 at Unknown time  . albuterol (PROVENTIL HFA;VENTOLIN HFA) 108 (90 Base) MCG/ACT inhaler Inhale 2 puffs into the lungs 4 (four) times daily.    unknown  . allopurinol (ZYLOPRIM) 300 MG tablet Take 0.5 tablets (150 mg total) by mouth daily.   03/12/2017 at Unknown time  . ALPRAZolam (XANAX) 0.25 MG tablet Take 1 tablet (0.25 mg total) by mouth 2 (two) times daily as needed for anxiety. 60 tablet 0 unknown  . apixaban (ELIQUIS) 5 MG TABS tablet Take 1 tablet (5 mg total) by mouth 2 (two) times daily. 60 tablet 5 03/12/2017 at 1000  . Balsam [place]-Castor Oil (VENELEX) OINT Apply 1 application topically See admin instructions. Apply to sacrum and bilateral buttocks every shift and as needed.   03/11/2017 at Unknown time  . calcium carbonate (TUMS - DOSED IN MG ELEMENTAL CALCIUM) 500 MG chewable tablet Chew 1 tablet by mouth 2 (two) times daily.   03/12/2017 at Unknown time  . cetirizine (ZYRTEC) 10 MG tablet Take 10 mg by mouth daily.   03/12/2017 at Unknown time  . cholecalciferol (VITAMIN D) 1000 units tablet Take 1,000 Units by mouth every morning.   03/12/2017 at Unknown time  . colchicine 0.6 MG tablet Take 1 tablet (0.6 mg total) by mouth 2 (two) times daily.   03/12/2017 at Unknown time  . diltiazem (CARDIZEM CD) 120 MG 24 hr capsule Take 1 capsule (120 mg  total) by mouth daily. 90 capsule 3 03/12/2017 at Unknown time  . escitalopram (LEXAPRO) 10 MG tablet Take 10 mg by mouth daily.   03/12/2017 at Unknown time  . feeding supplement, GLUCERNA SHAKE, (GLUCERNA SHAKE) LIQD Take 237 mLs by mouth 3 (three) times daily between meals.  0 03/12/2017 at Unknown time  . fluticasone furoate-vilanterol (BREO ELLIPTA) 200-25 MCG/INH AEPB Inhale 1 puff into the lungs daily.   03/12/2017 at Unknown time  . gabapentin (NEURONTIN) 100 MG capsule Take 100 mg by mouth 2 (two) times daily.   03/12/2017 at Unknown time  . HYDROcodone-acetaminophen (NORCO) 7.5-325 MG tablet Take 1 tablet by mouth 3 (three) times daily as needed for moderate pain.   Past Week at Unknown time  . insulin glargine (LANTUS) 100 UNIT/ML injection Inject 0.2 mLs (20 Units total) into the skin daily. 10 mL 11 03/11/2017 at Unknown time  . insulin lispro (HUMALOG) 100 UNIT/ML injection Inject 3-20 Units into the skin 3 (three) times daily before meals. 121-150=1 units 151-200=2 units 201-250=3 units 251-300=5units 301-350=7units 351-400=9units   03/12/2017 at Unknown time  . levothyroxine (SYNTHROID, LEVOTHROID) 137 MCG tablet Take 137 mcg by mouth daily before breakfast.   03/12/2017 at Unknown time  . loperamide (IMODIUM A-D) 2 MG tablet Take 2 mg by mouth 4 (four) times daily as needed for diarrhea or loose stools.     . metoCLOPramide (REGLAN) 10 MG tablet Take 0.5 tablets (5 mg total) by mouth every 6 (six) hours as needed for nausea (nausea). 6 tablet 0 03/12/2017 at Unknown time  . metoprolol tartrate (LOPRESSOR) 25 MG tablet TAKE 1/2 TABLET BY MOUTH TWICE DAILY 90 tablet 1 03/12/2017 at 1000  . Multiple Vitamin (MULTIVITAMIN WITH MINERALS) TABS tablet Take 1 tablet by mouth daily.   03/12/2017 at Unknown time  . omeprazole (PRILOSEC) 20 MG capsule TAKE ONE CAPSULE BY MOUTH DAILY 90 capsule 3 03/11/2017 at Unknown time  . ondansetron (ZOFRAN) 4 MG tablet Take 1 tablet (4 mg total) by mouth every 6  (  six) hours as needed for nausea. 20 tablet 0 03/12/2017 at Unknown time  . polyvinyl alcohol (LIQUIFILM TEARS) 1.4 % ophthalmic solution Place 1 drop into both eyes 4 (four) times daily as needed for dry eyes. 15 mL 0 03/11/2017 at Unknown time  . promethazine (PHENERGAN) 12.5 MG tablet Take 12.5 mg by mouth every 6 (six) hours as needed for nausea or vomiting.   03/12/2017 at Unknown time  . triamcinolone (NASACORT ALLERGY 24HR) 55 MCG/ACT AERO nasal inhaler Place 2 sprays into the nose daily.     . Doxepin HCl (SILENOR) 6 MG TABS Take 1 tablet (6 mg total) by mouth at bedtime as needed. 30 tablet  Past Week at Unknown time  . potassium chloride SA (K-DUR,KLOR-CON) 20 MEQ tablet Take 1 tablet (20 mEq total) by mouth daily. (Patient not taking: Reported on 03/12/2017)   Not Taking at Unknown time  . torsemide (DEMADEX) 20 MG tablet Take 1 tablet (20 mg total) by mouth 2 (two) times daily. (may take an extra '20mg'$  as needed) (Patient not taking: Reported on 03/12/2017)   Not Taking at Unknown time   Scheduled: . allopurinol  150 mg Oral Daily  . escitalopram  10 mg Oral Daily  . feeding supplement (GLUCERNA SHAKE)  237 mL Oral TID BM  . fluticasone furoate-vilanterol  1 puff Inhalation Daily  . gabapentin  100 mg Oral BID  . heparin  5,000 Units Subcutaneous Q8H  . insulin aspart  0-15 Units Subcutaneous TID WC  . insulin aspart  0-5 Units Subcutaneous QHS  . insulin glargine  20 Units Subcutaneous Daily  . levothyroxine  137 mcg Oral QAC breakfast  . metoprolol tartrate  12.5 mg Oral BID  . pantoprazole  40 mg Oral Daily  . vancomycin  250 mg Oral Q6H   Continuous: . sodium chloride 125 mL/hr at 03/13/17 2316   GTX:[id number] **OR** acetaminophen, ALPRAZolam, HYDROcodone-acetaminophen, metoCLOPramide, ondansetron **OR** ondansetron (ZOFRAN) IV  Assesment: She was admitted with dehydration, hyponatremia, acute kidney injury and is presumed to have C. difficile colitis and she's been treated  for that. Studies are pending. She still has diarrhea but it is a little bit less. Her sodium level has improved. Renal function has improved but she still has a creatinine of about 2.5 which is well above her baseline. Her weakness is better. 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 She does have a history of heart failure so we have to be careful with that. However she still needs fluids I think because I think she still a little bit dry and still has acute kidney injury. Continue treatment for C. difficile until we know for sure if she is positive or not. PT consult    LOS: 2 days   Yolanda [name] L 03/14/2017, 8:07 AM

## 2017-03-14 NOTE — Progress Notes (Signed)
PT Cancellation Note  Patient Details Name: Yolanda MurrainBrenda W [name] MRN: [id number] DOB: 07-03-1941   Cancelled Treatment:    Reason Eval/Treat Not Completed: Medical issues which prohibited therapy (Chart reviewed. Na+ remains low (128), outside of acceptible range for OOB assessment with PT. WIll continue to monitor remotely and attempt PT eval again at later date/time. Na+ will need to be >129 to [facility] policy. )  8:53 AM, 03/14/17 [name], PT, DPT Physical Therapist - [place] [phone](ASCOM)  [phone] (Office)    Buccola,Allan C 03/14/2017, 8:53 AM

## 2017-03-14 NOTE — Progress Notes (Signed)
  Subjective:  Patient states she feels about the same. She complains of hypogastric pain when she urinates and she also complains of perianal burning with her bowel movement. She has had 3 bowel movements today. Stools are loose. One was large volume and the others small-volume. She denies melena or rectal bleeding. She says appetite is coming back. She is looking forward to her supper.   Objective: Blood pressure (!) 114/42, pulse 91, temperature 98.4 F (36.9 C), temperature source Oral, resp. rate 16, height 5\' 1"  (1.549 m), weight 194 lb 14.2 oz (88.4 kg), SpO2 98 %. Patient is alert and in no acute distress. She has a round facies. Conjunctiva is pale.. Abdomen is less distended than yesterday. Bowel sounds are normal. On palpation it is soft and nontender without organomegaly or masses. No LE edema or clubbing noted.  Labs/studies Results:   Recent Labs  03/12/17 1136 03/13/17 0557 03/14/17 0541  WBC 7.9 6.2 4.9  HGB 9.9* 8.7* 7.9*  HCT 29.2* 25.7* 23.4*  PLT 286 262 257    BMET   Recent Labs  03/12/17 1136 03/13/17 0557 03/14/17 0541  NA 120* 126* 128*  K 4.8 4.7 4.2  CL 91* 100* 104  CO2 20* 19* 17*  GLUCOSE 84 79 72  BUN 65* 57* 50*  CREATININE 4.28* 3.33* 2.64*  CALCIUM 8.7* 8.0* 7.6*    LFT   Recent Labs  03/12/17 0717 03/12/17 1136 03/13/17 0557  PROT 4.9* 5.6* 4.8*  ALBUMIN 2.3* 2.6* 2.2*  AST 54* 60* 54*  ALT 30 34 30  ALKPHOS 110 125 108  BILITOT 1.0 1.0 1.0    Stool C. difficile antigen is positive but toxin negative. C. difficile by PCR pending.  Serum iron 119 TIBC 1:30 and saturation 91%. Serum ferritin 1116  Assessment:  #1. Diarrhea. C. difficile antigen is positive but toxin is negative. She is behaving like she has C. difficile colitis. Oral vancomycin was started yesterday afternoon. 2 early to see improvement but she certainly is not getting worse.  #2. Anemia. Hemoglobin has dropped since admission. No evidence of overt GI  bleed. Iron studies not consistent with iron deficiency anemia. Acute on chronic anemia would appear to be due to acute illness. Some drop also expected with hydration. Last colonoscopy was in December 2010 and she should undergo on when she is fully recovered from acute illness.  #3. Painful urination. We will proceed with urinalysis.  #4. Perianal burning secondary to diarrhea.  #5. Renal dysfunction. Significant improvement in renal function with hydration.  #6. Hyponatremia secondary to GI losses. Serum sodium is gradually coming up.  Recommendations:  CBC and metabolic 7 in a.m.Marland Kitchen. Decrease IV fluid rate to 75 mL per hour. Urinalysis. Mycolog-II cream to be applied to perianal area twice a day while she is having diarrhea.

## 2017-03-15 LAB — CBC WITH DIFFERENTIAL/PLATELET
BASOS PCT: 0 %
Basophils Absolute: 0 10*3/uL (ref 0.0–0.1)
Eosinophils Absolute: 0.4 10*3/uL (ref 0.0–0.7)
Eosinophils Relative: 7 %
HEMATOCRIT: 25.9 % — AB (ref 36.0–46.0)
HEMOGLOBIN: 8.7 g/dL — AB (ref 12.0–15.0)
LYMPHS PCT: 32 %
Lymphs Abs: 1.6 10*3/uL (ref 0.7–4.0)
MCH: 32.2 pg (ref 26.0–34.0)
MCHC: 33.6 g/dL (ref 30.0–36.0)
MCV: 95.9 fL (ref 78.0–100.0)
MONOS PCT: 8 %
Monocytes Absolute: 0.4 10*3/uL (ref 0.1–1.0)
NEUTROS ABS: 2.6 10*3/uL (ref 1.7–7.7)
NEUTROS PCT: 53 %
Platelets: 291 10*3/uL (ref 150–400)
RBC: 2.7 MIL/uL — ABNORMAL LOW (ref 3.87–5.11)
RDW: 16.1 % — ABNORMAL HIGH (ref 11.5–15.5)
WBC: 5 10*3/uL (ref 4.0–10.5)

## 2017-03-15 LAB — GASTROINTESTINAL PANEL BY PCR, STOOL (REPLACES STOOL CULTURE)
ASTROVIRUS: NOT DETECTED
Adenovirus F40/41: NOT DETECTED
CRYPTOSPORIDIUM: NOT DETECTED
CYCLOSPORA CAYETANENSIS: NOT DETECTED
Campylobacter species: NOT DETECTED
ENTAMOEBA HISTOLYTICA: NOT DETECTED
ENTEROTOXIGENIC E COLI (ETEC): NOT DETECTED
Enteroaggregative E coli (EAEC): NOT DETECTED
Enteropathogenic E coli (EPEC): NOT DETECTED
Giardia lamblia: NOT DETECTED
Norovirus GI/GII: NOT DETECTED
Plesimonas shigelloides: NOT DETECTED
Rotavirus A: NOT DETECTED
SALMONELLA SPECIES: NOT DETECTED
SAPOVIRUS (I, II, IV, AND V): NOT DETECTED
Shiga like toxin producing E coli (STEC): NOT DETECTED
Shigella/Enteroinvasive E coli (EIEC): NOT DETECTED
VIBRIO CHOLERAE: NOT DETECTED
VIBRIO SPECIES: NOT DETECTED
YERSINIA ENTEROCOLITICA: NOT DETECTED

## 2017-03-15 LAB — BASIC METABOLIC PANEL
Anion gap: 7 (ref 5–15)
BUN: 39 mg/dL — ABNORMAL HIGH (ref 6–20)
CHLORIDE: 107 mmol/L (ref 101–111)
CO2: 17 mmol/L — AB (ref 22–32)
CREATININE: 1.99 mg/dL — AB (ref 0.44–1.00)
Calcium: 8.1 mg/dL — ABNORMAL LOW (ref 8.9–10.3)
GFR calc non Af Amer: 24 mL/min — ABNORMAL LOW (ref >60)
GFR, EST AFRICAN AMERICAN: 27 mL/min — AB (ref >60)
Glucose, Bld: 81 mg/dL (ref 65–99)
Potassium: 4.1 mmol/L (ref 3.5–5.1)
Sodium: 131 mmol/L — ABNORMAL LOW (ref 135–145)

## 2017-03-15 LAB — GLUCOSE, CAPILLARY
GLUCOSE-CAPILLARY: 79 mg/dL (ref 65–99)
GLUCOSE-CAPILLARY: 83 mg/dL (ref 65–99)
Glucose-Capillary: 75 mg/dL (ref 65–99)

## 2017-03-15 NOTE — Care Management Note (Signed)
Case Management Note  Patient Details  Name: Yolanda MurrainBrenda W [name] MRN: [id number] Date of Birth: [birth year]  Subjective/Objective:                  [facility] Pt will need PT eval and insurance auth before going back to rehab. DC home may be only option if insurance does not cont to authorize SNF.   Action/Plan: CSW following, CM will cont to follow.   Expected Discharge Date:      03/15/2017            Expected Discharge Plan:  Skilled Nursing Facility  In-House Referral:  Clinical Social Work  Discharge planning Services  CM Consult  Status of Service:  In process, will continue to follow  Malcolm MetroChildress, Lavere Shinsky D[place]ke, RN 03/15/2017, 8:27 AM

## 2017-03-15 NOTE — Evaluation (Signed)
Physical Therapy Evaluation Patient Details Name: Ashley MurrainBrenda W Montville MRN: [id number] DOB: 21-Apr-1941 Today's Date: 03/15/2017   History of Present Illness  Harlow MaresBrenda Hally is a 76yo white female who [facility] after worsening diarrhea, intermittent vomitting, and weakness: pt found to be hyponatremic upon arrival. The patient has been at [facility] for [facility] weeks, 2 hospital admissions prior to that, with a [facility] day stay at [facility]. Previously, pt was at home, independent in ADL, IADL, and accessing the community. History of falls prior to that. Pt was held 1DA fro PT eval d/t Na+: 128, which is now 131.   Clinical Impression  Pt admitted with above diagnosis. Pt currently with functional limitations due to the deficits listed below (see "PT Problem List"). Upon entry, the patient is received semirecumbent in bed, no family/caregiver present. The pt is awake and agreeable to participate, drowsy with slurred speech and delayed response, the pt citing pain meds. No acute distress noted at this time, pt only reporting some mild, nonconcerning pain in ABD. The pt is alert and oriented x4, pleasant, conversational, and following simple commands consistently. Pt demonstrating impaired strength requiring physical assistance to complete bed mobility, transfers, and close supervision to perform AMB in-room which is limited to less than 30 feet, whereas pt was an independent community dweller back in April 2018. Participation with STR in 2 venues has been limited due to continued complex course of illness, however the patient remains hightly motivated to complete rehab with eventual return to home. Pt will benefit from skilled PT intervention to increase independence and safety with basic mobility in preparation for discharge to the venue listed below.       Follow Up Recommendations SNF    Equipment Recommendations  None recommended by PT    Recommendations for Other Services        Precautions / Restrictions Restrictions Weight Bearing Restrictions: No      Mobility  Bed Mobility Overal bed mobility: Needs Assistance Bed Mobility: Supine to Sit;Rolling Rolling: Supervision;Min guard (uses rails)   Supine to sit: Min assist (HHA to pull self up to sitting)        Transfers Overall transfer level: Needs assistance Equipment used: Rolling walker (2 wheeled) Transfers: Sit to/from Stand Sit to Stand: Min assist         General transfer comment: weakness in legs, otherwise good form noted; remains standing supervision with RW while RN and PT  assist with donning of brief for AMB in room.   Ambulation/Gait Ambulation/Gait assistance: Min guard Ambulation Distance (Feet): 15 Feet (EOB to chair.) Assistive device: Rolling walker (2 wheeled)       General Gait Details: appears mildly unstable and difficuly noted moreso during turning in room. Pt reports is unable to AMB much farther.   Stairs            Wheelchair Mobility    Modified Rankin (Stroke Patients Only)       Balance Overall balance assessment: History of Falls;Modified Independent;No apparent balance deficits (not formally assessed)                                           Pertinent Vitals/Pain Pain Assessment:  (non concerning pain in stomach)    Home Living Family/patient expects to be discharged to:: Skilled nursing facility  Prior Function Level of Independence: Independent with assistive device(s)         Comments: Up until April 2018 she was driving, and was able to ambulate community distances.       Hand Dominance   Dominant Hand: Right    Extremity/Trunk Assessment   Upper Extremity Assessment Upper Extremity Assessment: Generalized weakness;Overall [facility] for tasks assessed    Lower Extremity Assessment Lower Extremity Assessment: Generalized weakness;Overall [facility] for tasks assessed       Communication    Communication: No difficulties (Previously without deficit; )  Cognition Arousal/Alertness: Suspect due to medications (drowsy ) Behavior During Therapy: Flat affect Overall Cognitive Status: Impaired/Different from baseline Area of Impairment: Orientation                 Orientation Level: Person;Place;Time;Situation             General Comments: delayed response with intermittent freezing during questioning; slurred and bradykinetic speech      General Comments      Exercises     Assessment/Plan    PT Assessment Patient needs continued PT services  PT Problem List Decreased strength;Decreased activity tolerance;Decreased balance;Decreased mobility;Decreased cognition;Obesity       PT Treatment Interventions Gait training;Functional mobility training;Therapeutic activities;Therapeutic exercise;Balance training;Patient/family education    PT Goals (Current goals can be found in the Care Plan section)  Acute Rehab PT Goals Patient Stated Goal: Regain strength and independence to return home PT Goal Formulation: With patient Time For Goal Achievement: 03/29/17 Potential to Achieve Goals: Good    Frequency Min 2X/week   Barriers to discharge Inaccessible home environment;Decreased caregiver support      Co-evaluation               AM-PAC PT "6 Clicks" Daily Activity  Outcome Measure Difficulty turning over in bed (including adjusting bedclothes, sheets and blankets)?: A Lot Difficulty moving from lying on back to sitting on the side of the bed? : Total Difficulty sitting down on and standing up from a chair with arms (e.g., wheelchair, bedside commode, etc,.)?: Total Help needed moving to and from a bed to chair (including a wheelchair)?: Total Help needed walking in hospital room?: A Lot Help needed climbing 3-5 steps with a railing? : Total 6 Click Score: 8    End of Session Equipment Utilized During Treatment: Gait belt Activity Tolerance:  Patient tolerated treatment well;Patient limited by fatigue Patient left: in chair;with call bell/phone within reach Nurse Communication: Mobility status PT Visit Diagnosis: Unsteadiness on feet (R26.81);History of falling (Z91.81);Difficulty in walking, not elsewhere classified (R26.2)    Time: 1100-1133 PT Time Calculation (min) (ACUTE ONLY): 33 min   Charges:   PT Evaluation $PT Eval Moderate Complexity: 1 Procedure PT Treatments $Therapeutic Activity: 8-22 mins   PT G Codes:        12:12 PM, 03/28/17 Rosamaria Lints, PT, DPT Physical Therapist - [place] [phone] (Office)    Cortavious Nix C 03-28-17, 12:09 PM

## 2017-03-15 NOTE — Progress Notes (Signed)
Subjective: She says she feels better. She has been able to eat. She still has a little bit of diarrhea. She's having some perianal burning which she also says is better. No other new complaints. She still feels weak  Objective: Vital signs in last 24 hours: Temp:  [97.5 F (36.4 C)-98.4 F (36.9 C)] 97.5 F (36.4 C) (06/22 0500) Pulse Rate:  [91] 91 (06/22 0500) Resp:  [16-18] 18 (06/22 0500) BP: (109-116)/(41-46) 116/41 (06/22 0500) SpO2:  [96 %-98 %] 96 % (06/22 0808) Weight change:  Last BM Date: 03/14/17  Intake/Output from previous day: 06/21 0701 - 06/22 0700 In: 6995.8 [P.O.:290; I.V.:6705.8] Out: -   PHYSICAL EXAM General appearance: alert, cooperative, mild distress and morbidly obese Resp: clear to auscultation bilaterally Cardio: regular rate and rhythm, S1, S2 normal, no murmur, click, rub or gallop GI: soft, non-tender; bowel sounds normal; no masses,  no organomegaly Extremities: extremities normal, atraumatic, no cyanosis or edema Skin warm and dry.  Lab Results:  Results for orders placed or performed during the hospital encounter of 03/12/17 (from the past 48 hour(s))  Glucose, capillary     Status: None   Collection Time: 03/13/17 11:50 AM  Result Value Ref Range   Glucose-Capillary 76 65 - 99 mg/dL  Glucose, capillary     Status: None   Collection Time: 03/13/17  4:56 PM  Result Value Ref Range   Glucose-Capillary 88 65 - 99 mg/dL   Comment 1 Notify RN    Comment 2 Document in Chart   Glucose, capillary     Status: None   Collection Time: 03/13/17  8:27 PM  Result Value Ref Range   Glucose-Capillary 88 65 - 99 mg/dL   Comment 1 Notify RN    Comment 2 Document in Chart   CBC with Differential/Platelet     Status: Abnormal   Collection Time: 03/14/17  5:41 AM  Result Value Ref Range   WBC 4.9 4.0 - 10.5 K/uL   RBC 2.44 (L) 3.87 - 5.11 MIL/uL   Hemoglobin 7.9 (L) 12.0 - 15.0 g/dL   HCT 23.4 (L) 36.0 - 46.0 %   MCV 95.9 78.0 - 100.0 fL   MCH  32.4 26.0 - 34.0 pg   MCHC 33.8 30.0 - 36.0 g/dL   RDW 15.9 (H) 11.5 - 15.5 %   Platelets 257 150 - 400 K/uL   Neutrophils Relative % 47 %   Neutro Abs 2.3 1.7 - 7.7 K/uL   Lymphocytes Relative 37 %   Lymphs Abs 1.8 0.7 - 4.0 K/uL   Monocytes Relative 10 %   Monocytes Absolute 0.5 0.1 - 1.0 K/uL   Eosinophils Relative 5 %   Eosinophils Absolute 0.3 0.0 - 0.7 K/uL   Basophils Relative 1 %   Basophils Absolute 0.0 0.0 - 0.1 K/uL  Basic metabolic panel     Status: Abnormal   Collection Time: 03/14/17  5:41 AM  Result Value Ref Range   Sodium 128 (L) 135 - 145 mmol/L   Potassium 4.2 3.5 - 5.1 mmol/L   Chloride 104 101 - 111 mmol/L   CO2 17 (L) 22 - 32 mmol/L   Glucose, Bld 72 65 - 99 mg/dL   BUN 50 (H) 6 - 20 mg/dL   Creatinine, Ser 2.64 (H) 0.44 - 1.00 mg/dL   Calcium 7.6 (L) 8.9 - 10.3 mg/dL   GFR calc non Af Amer 17 (L) >60 mL/min   GFR calc Af Amer 19 (L) >60 mL/min  Comment: (NOTE) The eGFR has been calculated using the CKD EPI equation. This calculation has not been validated in all clinical situations. eGFR's persistently <60 mL/min signify possible Chronic Kidney Disease.    Anion gap 7 5 - 15  Iron and TIBC     Status: Abnormal   Collection Time: 03/14/17  5:42 AM  Result Value Ref Range   Iron 119 28 - 170 ug/dL   TIBC 130 (L) 250 - 450 ug/dL   Saturation Ratios 91 (H) 10.4 - 31.8 %   UIBC 11 ug/dL    Comment: Performed at [facility] [address], [place], [place] [address]  Ferritin     Status: Abnormal   Collection Time: 03/14/17  5:42 AM  Result Value Ref Range   Ferritin 1,116 (H) 11 - 307 ng/mL    Comment: Performed at [facility], [place] [address], [place], [place] [address]  Glucose, capillary     Status: None   Collection Time: 03/14/17  8:27 AM  Result Value Ref Range   Glucose-Capillary 71 65 - 99 mg/dL  Glucose, capillary     Status: None   Collection Time: 03/14/17 11:39 AM  Result Value Ref Range   Glucose-Capillary 77 65 - 99  mg/dL  Glucose, capillary     Status: None   Collection Time: 03/14/17  4:07 PM  Result Value Ref Range   Glucose-Capillary 76 65 - 99 mg/dL  Urinalysis, Routine w reflex microscopic     Status: Abnormal   Collection Time: 03/14/17  5:54 PM  Result Value Ref Range   Color, Urine YELLOW YELLOW   APPearance CLOUDY (A) CLEAR   Specific Gravity, Urine 1.005 1.005 - 1.030   pH 5.0 5.0 - 8.0   Glucose, UA NEGATIVE NEGATIVE mg/dL   Hgb urine dipstick SMALL (A) NEGATIVE   Bilirubin Urine NEGATIVE NEGATIVE   Ketones, ur NEGATIVE NEGATIVE mg/dL   Protein, ur NEGATIVE NEGATIVE mg/dL   Nitrite NEGATIVE NEGATIVE   Leukocytes, UA LARGE (A) NEGATIVE   RBC / HPF 6-30 0 - 5 RBC/hpf   WBC, UA TOO NUMEROUS TO COUNT 0 - 5 WBC/hpf   Bacteria, UA MANY (A) NONE SEEN   Squamous Epithelial / LPF 0-5 (A) NONE SEEN   WBC Clumps PRESENT   Glucose, capillary     Status: None   Collection Time: 03/14/17 10:04 PM  Result Value Ref Range   Glucose-Capillary 74 65 - 99 mg/dL  CBC with Differential/Platelet     Status: Abnormal   Collection Time: 03/15/17  6:10 AM  Result Value Ref Range   WBC 5.0 4.0 - 10.5 K/uL   RBC 2.70 (L) 3.87 - 5.11 MIL/uL   Hemoglobin 8.7 (L) 12.0 - 15.0 g/dL   HCT 25.9 (L) 36.0 - 46.0 %   MCV 95.9 78.0 - 100.0 fL   MCH 32.2 26.0 - 34.0 pg   MCHC 33.6 30.0 - 36.0 g/dL   RDW 16.1 (H) 11.5 - 15.5 %   Platelets 291 150 - 400 K/uL   Neutrophils Relative % 53 %   Neutro Abs 2.6 1.7 - 7.7 K/uL   Lymphocytes Relative 32 %   Lymphs Abs 1.6 0.7 - 4.0 K/uL   Monocytes Relative 8 %   Monocytes Absolute 0.4 0.1 - 1.0 K/uL   Eosinophils Relative 7 %   Eosinophils Absolute 0.4 0.0 - 0.7 K/uL   Basophils Relative 0 %   Basophils Absolute 0.0 0.0 - 0.1 K/uL  Basic metabolic panel     Status: Abnormal   Collection Time: 03/15/17  6:10 AM  Result Value Ref Range   Sodium 131 (L) 135 - 145 mmol/L   Potassium 4.1 3.5 - 5.1 mmol/L   Chloride 107 101 - 111 mmol/L   CO2 17 (L) 22 - 32 mmol/L    Glucose, Bld 81 65 - 99 mg/dL   BUN 39 (H) 6 - 20 mg/dL   Creatinine, Ser 1.99 (H) 0.44 - 1.00 mg/dL   Calcium 8.1 (L) 8.9 - 10.3 mg/dL   GFR calc non Af Amer 24 (L) >60 mL/min   GFR calc Af Amer 27 (L) >60 mL/min    Comment: (NOTE) The eGFR has been calculated using the CKD EPI equation. This calculation has not been validated in all clinical situations. eGFR's persistently <60 mL/min signify possible Chronic Kidney Disease.    Anion gap 7 5 - 15  Glucose, capillary     Status: None   Collection Time: 03/15/17  7:51 AM  Result Value Ref Range   Glucose-Capillary 75 65 - 99 mg/dL    ABGS No results for input(s): PHART, PO2ART, TCO2, HCO3 in the last 72 hours.  Invalid input(s): PCO2 CULTURES Recent Results (from the past 240 hour(s))  C difficile quick scan w PCR reflex     Status: Abnormal   Collection Time: 03/12/17  2:01 PM  Result Value Ref Range Status   C Diff antigen POSITIVE (A) NEGATIVE Final   C Diff toxin NEGATIVE NEGATIVE Final   C Diff interpretation Results are indeterminate. See PCR results.  Final  Clostridium Difficile by PCR     Status: None   Collection Time: 03/12/17  2:01 PM  Result Value Ref Range Status   Toxigenic C Difficile by pcr NEGATIVE NEGATIVE Final    Comment: Patient is colonized with non toxigenic C. difficile. May not need treatment unless significant symptoms are present. Performed at [facility], [place] [address], [place], [place] [address]   MRSA PCR Screening     Status: None   Collection Time: 03/12/17  4:11 PM  Result Value Ref Range Status   MRSA by PCR NEGATIVE NEGATIVE Final    Comment:        The GeneXpert MRSA Assay (FDA approved for NASAL specimens only), is one component of a comprehensive MRSA colonization surveillance program. It is not intended to diagnose MRSA infection nor to guide or monitor treatment for MRSA infections.    Studies/Results: No results found.  Medications:  Prior to Admission:   Prescriptions Prior to Admission  Medication Sig Dispense Refill Last Dose  . acetaminophen (TYLENOL) 325 MG tablet Take 2 tablets (650 mg total) by mouth every 6 (six) hours as needed for mild pain (or Fever >/= 101).   03/11/2017 at Unknown time  . albuterol (PROVENTIL HFA;VENTOLIN HFA) 108 (90 Base) MCG/ACT inhaler Inhale 2 puffs into the lungs 4 (four) times daily.    unknown  . allopurinol (ZYLOPRIM) 300 MG tablet Take 0.5 tablets (150 mg total) by mouth daily.   03/12/2017 at Unknown time  . ALPRAZolam (XANAX) 0.25 MG tablet Take 1 tablet (0.25 mg total) by mouth 2 (two) times daily as needed for anxiety. 60 tablet 0 unknown  . apixaban (ELIQUIS) 5 MG TABS tablet Take 1 tablet (5 mg total) by mouth 2 (two) times daily. 60 tablet 5 03/12/2017 at 1000  . Balsam [place]-Castor Oil (VENELEX) OINT Apply 1 application topically See admin instructions. Apply  to sacrum and bilateral buttocks every shift and as needed.   03/11/2017 at Unknown time  . calcium carbonate (TUMS - DOSED IN MG ELEMENTAL CALCIUM) 500 MG chewable tablet Chew 1 tablet by mouth 2 (two) times daily.   03/12/2017 at Unknown time  . cetirizine (ZYRTEC) 10 MG tablet Take 10 mg by mouth daily.   03/12/2017 at Unknown time  . cholecalciferol (VITAMIN D) 1000 units tablet Take 1,000 Units by mouth every morning.   03/12/2017 at Unknown time  . colchicine 0.6 MG tablet Take 1 tablet (0.6 mg total) by mouth 2 (two) times daily.   03/12/2017 at Unknown time  . diltiazem (CARDIZEM CD) 120 MG 24 hr capsule Take 1 capsule (120 mg total) by mouth daily. 90 capsule 3 03/12/2017 at Unknown time  . escitalopram (LEXAPRO) 10 MG tablet Take 10 mg by mouth daily.   03/12/2017 at Unknown time  . feeding supplement, GLUCERNA SHAKE, (GLUCERNA SHAKE) LIQD Take 237 mLs by mouth 3 (three) times daily between meals.  0 03/12/2017 at Unknown time  . fluticasone furoate-vilanterol (BREO ELLIPTA) 200-25 MCG/INH AEPB Inhale 1 puff into the lungs daily.   03/12/2017 at  Unknown time  . gabapentin (NEURONTIN) 100 MG capsule Take 100 mg by mouth 2 (two) times daily.   03/12/2017 at Unknown time  . HYDROcodone-acetaminophen (NORCO) 7.5-325 MG tablet Take 1 tablet by mouth 3 (three) times daily as needed for moderate pain.   Past Week at Unknown time  . insulin glargine (LANTUS) 100 UNIT/ML injection Inject 0.2 mLs (20 Units total) into the skin daily. 10 mL 11 03/11/2017 at Unknown time  . insulin lispro (HUMALOG) 100 UNIT/ML injection Inject 3-20 Units into the skin 3 (three) times daily before meals. 121-150=1 units 151-200=2 units 201-250=3 units 251-300=5units 301-350=7units 351-400=9units   03/12/2017 at Unknown time  . levothyroxine (SYNTHROID, LEVOTHROID) 137 MCG tablet Take 137 mcg by mouth daily before breakfast.   03/12/2017 at Unknown time  . loperamide (IMODIUM A-D) 2 MG tablet Take 2 mg by mouth 4 (four) times daily as needed for diarrhea or loose stools.     . metoCLOPramide (REGLAN) 10 MG tablet Take 0.5 tablets (5 mg total) by mouth every 6 (six) hours as needed for nausea (nausea). 6 tablet 0 03/12/2017 at Unknown time  . metoprolol tartrate (LOPRESSOR) 25 MG tablet TAKE 1/2 TABLET BY MOUTH TWICE DAILY 90 tablet 1 03/12/2017 at 1000  . Multiple Vitamin (MULTIVITAMIN WITH MINERALS) TABS tablet Take 1 tablet by mouth daily.   03/12/2017 at Unknown time  . omeprazole (PRILOSEC) 20 MG capsule TAKE ONE CAPSULE BY MOUTH DAILY 90 capsule 3 03/11/2017 at Unknown time  . ondansetron (ZOFRAN) 4 MG tablet Take 1 tablet (4 mg total) by mouth every 6 (six) hours as needed for nausea. 20 tablet 0 03/12/2017 at Unknown time  . polyvinyl alcohol (LIQUIFILM TEARS) 1.4 % ophthalmic solution Place 1 drop into both eyes 4 (four) times daily as needed for dry eyes. 15 mL 0 03/11/2017 at Unknown time  . promethazine (PHENERGAN) 12.5 MG tablet Take 12.5 mg by mouth every 6 (six) hours as needed for nausea or vomiting.   03/12/2017 at Unknown time  . triamcinolone (NASACORT ALLERGY  24HR) 55 MCG/ACT AERO nasal inhaler Place 2 sprays into the nose daily.     . Doxepin HCl (SILENOR) 6 MG TABS Take 1 tablet (6 mg total) by mouth at bedtime as needed. 30 tablet  Past Week at Unknown time  . potassium chloride  SA (K-DUR,KLOR-CON) 20 MEQ tablet Take 1 tablet (20 mEq total) by mouth daily. (Patient not taking: Reported on 03/12/2017)   Not Taking at Unknown time  . torsemide (DEMADEX) 20 MG tablet Take 1 tablet (20 mg total) by mouth 2 (two) times daily. (may take an extra '20mg'$  as needed) (Patient not taking: Reported on 03/12/2017)   Not Taking at Unknown time   Scheduled: . allopurinol  150 mg Oral Daily  . escitalopram  10 mg Oral Daily  . feeding supplement (GLUCERNA SHAKE)  237 mL Oral TID BM  . fluticasone furoate-vilanterol  1 puff Inhalation Daily  . gabapentin  100 mg Oral BID  . heparin  5,000 Units Subcutaneous Q8H  . insulin aspart  0-15 Units Subcutaneous TID WC  . insulin aspart  0-5 Units Subcutaneous QHS  . insulin glargine  20 Units Subcutaneous Daily  . levothyroxine  137 mcg Oral QAC breakfast  . metoprolol tartrate  12.5 mg Oral BID  . nystatin-triamcinolone   Topical BID  . pantoprazole  40 mg Oral Daily  . vancomycin  250 mg Oral Q6H   Continuous: . sodium chloride 75 mL/hr at 03/14/17 1804   CHE:NIDPOEUMPNTIR **OR** acetaminophen, ALPRAZolam, HYDROcodone-acetaminophen, metoCLOPramide, ondansetron **OR** ondansetron (ZOFRAN) IV  Assesment: She was admitted with diarrhea, dehydration, hyponatremia and weakness. This is the second similar episode that she's had. She also had acute kidney injury. It is presumed that this is related to Clostridium difficile and she is being treated for that. At baseline she has COPD which is stable. She has paroxysmal atrial fib and she is chronically anticoagulated. She has diabetes which is doing okay. She has history of diastolic heart failure but she still needs fluids now because of her dehydration. Active Problems:    GERD (gastroesophageal reflux disease)   COPD (chronic obstructive pulmonary disease) (HCC)   Hypothyroidism   PAF (paroxysmal atrial fibrillation) (HCC)   Hyponatremia   AKI (acute kidney injury) ([place])   Diabetes mellitus type 2 in obese ([place])   Diarrhea    Plan: Continue treatments    LOS: 3 days   Yolanda [name] L 03/15/2017, 8:27 AM

## 2017-03-15 NOTE — Progress Notes (Signed)
  Subjective:  She feels better. Her appetite is improving. She denies nausea or vomiting. She is still having hypogastric pain. She denies dysuria today. She says Marina GoodellPerry anal pain has resolved. She says she is very weeks looking forward to help from physical therapy. She has been evaluated by PT and therapy delayed because of hyponatremia.  Objective: Blood pressure (!) 106/48, pulse 82, temperature 97.5 F (36.4 C), temperature source Oral, resp. rate 16, height 5\' 1"  (1.549 m), weight 194 lb 14.2 oz (88.4 kg), SpO2 100 %. Patient is alert and sitting and reclining chair. She is in no acute distress. Abdomen is full. Bowel sounds are normal. On palpation abdomen is soft. She has mild tenderness in hypogastric region. She has 1+ pitting edema involving both legs. It is slightly more on the right side.  Labs/studies Results:   Recent Labs  03/13/17 0557 03/14/17 0541 03/15/17 0610  WBC 6.2 4.9 5.0  HGB 8.7* 7.9* 8.7*  HCT 25.7* 23.4* 25.9*  PLT 262 257 291    BMET   Recent Labs  03/13/17 0557 03/14/17 0541 03/15/17 0610  NA 126* 128* 131*  K 4.7 4.2 4.1  CL 100* 104 107  CO2 19* 17* 17*  GLUCOSE 79 72 81  BUN 57* 50* 39*  CREATININE 3.33* 2.64* 1.99*  CALCIUM 8.0* 7.6* 8.1*    LFT   Recent Labs  03/13/17 0557  PROT 4.8*  ALBUMIN 2.2*  AST 54*  ALT 30  ALKPHOS 108  BILITOT 1.0    C. difficile by PCR is negative. GI pathogen panel is negative.  Assessment:  #1. 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 She has been treated with clindamycin twice within the last 6 months for leg cellulitis. It is interesting to note that stool testing is only positive for antigen. She is responding to by mouth vancomycin. I doubt that diarrhea is due to conditions other than C. difficile. If diarrhea does not resolve with continued treatment she may need endoscopic evaluation. Perianal burning has resolved with Mycolog-II cream.  #2. Anemia. She has  history of iron deficiency anemia. At the present time anemia is due to acute and chronic illness.  #3. Renal dysfunction. Acute renal failure has corrected with IV hydration. 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 She was seen by nephrologist when she was hospitalized in May 2018.  #4. Hyponatremia. Serum sodium is not to 131. He remains on IV normal saline. Oral intake appears to be satisfactory. Rate will be further decreased to 50 mL per hour.  #5. Abnormal urinalysis suggestive of UTI. She is not having dysuria today like she had yesterday. Urine culture has been requested.  #6. Hypoalbuminemia. Low serum albumin secondary to GI losses as well as diminished oral intake due to acute illness. Oral intake is improving.  Recommendations:  Decrease IV fluid rate to 50 mL per hour. Metabolic 7 in a.m. Will continue vancomycin at current dose for total of 2 weeks after initial dose will be reduced to 250 mg by mouth twice a day.  Will plan to see patient in the office in 2 weeks. Will ask Dr. Jena Gaussourk/ Dr. Darrick PennaFields to see patient on 03/18/2017 since I will be on vacation.

## 2017-03-15 NOTE — Care Management Important Message (Signed)
Important Message  Patient Details  Name: Ashley MurrainBrenda W Bensch MRN: [id number] Date of Birth: Jul 30, 1941   Medicare Important Message Given:  Yes    Malcolm MetroChildress, Gailene Youkhana Demske, RN 03/15/2017, 1:28 PM

## 2017-03-16 LAB — CBC WITH DIFFERENTIAL/PLATELET
BASOS ABS: 0 10*3/uL (ref 0.0–0.1)
BASOS PCT: 0 %
EOS ABS: 0.3 10*3/uL (ref 0.0–0.7)
EOS PCT: 7 %
HCT: 25.1 % — ABNORMAL LOW (ref 36.0–46.0)
Hemoglobin: 8.4 g/dL — ABNORMAL LOW (ref 12.0–15.0)
LYMPHS PCT: 35 %
Lymphs Abs: 1.8 10*3/uL (ref 0.7–4.0)
MCH: 32.6 pg (ref 26.0–34.0)
MCHC: 33.5 g/dL (ref 30.0–36.0)
MCV: 97.3 fL (ref 78.0–100.0)
MONO ABS: 0.5 10*3/uL (ref 0.1–1.0)
Monocytes Relative: 9 %
Neutro Abs: 2.5 10*3/uL (ref 1.7–7.7)
Neutrophils Relative %: 49 %
PLATELETS: 282 10*3/uL (ref 150–400)
RBC: 2.58 MIL/uL — AB (ref 3.87–5.11)
RDW: 16.1 % — AB (ref 11.5–15.5)
WBC: 5.1 10*3/uL (ref 4.0–10.5)

## 2017-03-16 LAB — GLUCOSE, CAPILLARY
GLUCOSE-CAPILLARY: 79 mg/dL (ref 65–99)
GLUCOSE-CAPILLARY: 74 mg/dL (ref 65–99)
GLUCOSE-CAPILLARY: 80 mg/dL (ref 65–99)
Glucose-Capillary: 72 mg/dL (ref 65–99)
Glucose-Capillary: 80 mg/dL (ref 65–99)

## 2017-03-16 LAB — BASIC METABOLIC PANEL
ANION GAP: 5 (ref 5–15)
BUN: 33 mg/dL — ABNORMAL HIGH (ref 6–20)
CALCIUM: 8.2 mg/dL — AB (ref 8.9–10.3)
CO2: 18 mmol/L — ABNORMAL LOW (ref 22–32)
Chloride: 111 mmol/L (ref 101–111)
Creatinine, Ser: 1.62 mg/dL — ABNORMAL HIGH (ref 0.44–1.00)
GFR, EST AFRICAN AMERICAN: 35 mL/min — AB (ref >60)
GFR, EST NON AFRICAN AMERICAN: 30 mL/min — AB (ref >60)
Glucose, Bld: 75 mg/dL (ref 65–99)
POTASSIUM: 4.2 mmol/L (ref 3.5–5.1)
SODIUM: 134 mmol/L — AB (ref 135–145)

## 2017-03-16 NOTE — Progress Notes (Signed)
Subjective: She says she feels better. She is getting stronger. She did okay with physical therapy and it is recommended again that she go back to skilled care facility. She has no new complaints. Urine culture is pending. No chest pain. Her breathing is doing very well. No PND or orthopnea  Objective: Vital signs in last 24 hours: Temp:  [97.4 F (36.3 C)-98 F (36.7 C)] 98 F (36.7 C) (06/23 0629) Pulse Rate:  [82-90] 85 (06/23 0629) Resp:  [16-18] 18 (06/23 0629) BP: (103-112)/(47-57) 103/47 (06/23 0629) SpO2:  [94 %-100 %] 95 % (06/23 0835) Weight change:  Last BM Date: 03/15/17  Intake/Output from previous day: 06/22 0701 - 06/23 0700 In: 2507.5 [P.O.:960; I.V.:1547.5] Out: -   PHYSICAL EXAM General appearance: alert, cooperative, mild distress and morbidly obese Resp: clear to auscultation bilaterally Cardio: regular rate and rhythm, S1, S2 normal, no murmur, click, rub or gallop GI: soft, non-tender; bowel sounds normal; no masses,  no organomegaly Extremities: extremities normal, atraumatic, no cyanosis or edema Skin warm and dry  Lab Results:  Results for orders placed or performed during the hospital encounter of 03/12/17 (from the past 48 hour(s))  Glucose, capillary     Status: None   Collection Time: 03/14/17 11:39 AM  Result Value Ref Range   Glucose-Capillary 77 65 - 99 mg/dL  Glucose, capillary     Status: None   Collection Time: 03/14/17  4:07 PM  Result Value Ref Range   Glucose-Capillary 76 65 - 99 mg/dL  Urinalysis, Routine w reflex microscopic     Status: Abnormal   Collection Time: 03/14/17  5:54 PM  Result Value Ref Range   Color, Urine YELLOW YELLOW   APPearance CLOUDY (A) CLEAR   Specific Gravity, Urine 1.005 1.005 - 1.030   pH 5.0 5.0 - 8.0   Glucose, UA NEGATIVE NEGATIVE mg/dL   Hgb urine dipstick SMALL (A) NEGATIVE   Bilirubin Urine NEGATIVE NEGATIVE   Ketones, ur NEGATIVE NEGATIVE mg/dL   Protein, ur NEGATIVE NEGATIVE mg/dL   Nitrite  NEGATIVE NEGATIVE   Leukocytes, UA LARGE (A) NEGATIVE   RBC / HPF 6-30 0 - 5 RBC/hpf   WBC, UA TOO NUMEROUS TO COUNT 0 - 5 WBC/hpf   Bacteria, UA MANY (A) NONE SEEN   Squamous Epithelial / LPF 0-5 (A) NONE SEEN   WBC Clumps PRESENT   Glucose, capillary     Status: None   Collection Time: 03/14/17 10:04 PM  Result Value Ref Range   Glucose-Capillary 74 65 - 99 mg/dL  CBC with Differential/Platelet     Status: Abnormal   Collection Time: 03/15/17  6:10 AM  Result Value Ref Range   WBC 5.0 4.0 - 10.5 K/uL   RBC 2.70 (L) 3.87 - 5.11 MIL/uL   Hemoglobin 8.7 (L) 12.0 - 15.0 g/dL   HCT 25.9 (L) 36.0 - 46.0 %   MCV 95.9 78.0 - 100.0 fL   MCH 32.2 26.0 - 34.0 pg   MCHC 33.6 30.0 - 36.0 g/dL   RDW 16.1 (H) 11.5 - 15.5 %   Platelets 291 150 - 400 K/uL   Neutrophils Relative % 53 %   Neutro Abs 2.6 1.7 - 7.7 K/uL   Lymphocytes Relative 32 %   Lymphs Abs 1.6 0.7 - 4.0 K/uL   Monocytes Relative 8 %   Monocytes Absolute 0.4 0.1 - 1.0 K/uL   Eosinophils Relative 7 %   Eosinophils Absolute 0.4 0.0 - 0.7 K/uL   Basophils Relative 0 %  Basophils Absolute 0.0 0.0 - 0.1 K/uL  Basic metabolic panel     Status: Abnormal   Collection Time: 03/15/17  6:10 AM  Result Value Ref Range   Sodium 131 (L) 135 - 145 mmol/L   Potassium 4.1 3.5 - 5.1 mmol/L   Chloride 107 101 - 111 mmol/L   CO2 17 (L) 22 - 32 mmol/L   Glucose, Bld 81 65 - 99 mg/dL   BUN 39 (H) 6 - 20 mg/dL   Creatinine, Ser 1.99 (H) 0.44 - 1.00 mg/dL   Calcium 8.1 (L) 8.9 - 10.3 mg/dL   GFR calc non Af Amer 24 (L) >60 mL/min   GFR calc Af Amer 27 (L) >60 mL/min    Comment: (NOTE) The eGFR has been calculated using the CKD EPI equation. This calculation has not been validated in all clinical situations. eGFR's persistently <60 mL/min signify possible Chronic Kidney Disease.    Anion gap 7 5 - 15  Glucose, capillary     Status: None   Collection Time: 03/15/17  7:51 AM  Result Value Ref Range   Glucose-Capillary 75 65 - 99 mg/dL   Glucose, capillary     Status: None   Collection Time: 03/15/17 11:35 AM  Result Value Ref Range   Glucose-Capillary 79 65 - 99 mg/dL  Glucose, capillary     Status: None   Collection Time: 03/15/17  4:21 PM  Result Value Ref Range   Glucose-Capillary 83 65 - 99 mg/dL  Glucose, capillary     Status: None   Collection Time: 03/16/17  1:15 AM  Result Value Ref Range   Glucose-Capillary 79 65 - 99 mg/dL  CBC with Differential/Platelet     Status: Abnormal   Collection Time: 03/16/17  6:08 AM  Result Value Ref Range   WBC 5.1 4.0 - 10.5 K/uL   RBC 2.58 (L) 3.87 - 5.11 MIL/uL   Hemoglobin 8.4 (L) 12.0 - 15.0 g/dL   HCT 25.1 (L) 36.0 - 46.0 %   MCV 97.3 78.0 - 100.0 fL   MCH 32.6 26.0 - 34.0 pg   MCHC 33.5 30.0 - 36.0 g/dL   RDW 16.1 (H) 11.5 - 15.5 %   Platelets 282 150 - 400 K/uL   Neutrophils Relative % 49 %   Neutro Abs 2.5 1.7 - 7.7 K/uL   Lymphocytes Relative 35 %   Lymphs Abs 1.8 0.7 - 4.0 K/uL   Monocytes Relative 9 %   Monocytes Absolute 0.5 0.1 - 1.0 K/uL   Eosinophils Relative 7 %   Eosinophils Absolute 0.3 0.0 - 0.7 K/uL   Basophils Relative 0 %   Basophils Absolute 0.0 0.0 - 0.1 K/uL  Basic metabolic panel     Status: Abnormal   Collection Time: 03/16/17  6:08 AM  Result Value Ref Range   Sodium 134 (L) 135 - 145 mmol/L   Potassium 4.2 3.5 - 5.1 mmol/L   Chloride 111 101 - 111 mmol/L   CO2 18 (L) 22 - 32 mmol/L   Glucose, Bld 75 65 - 99 mg/dL   BUN 33 (H) 6 - 20 mg/dL   Creatinine, Ser 1.62 (H) 0.44 - 1.00 mg/dL   Calcium 8.2 (L) 8.9 - 10.3 mg/dL   GFR calc non Af Amer 30 (L) >60 mL/min   GFR calc Af Amer 35 (L) >60 mL/min    Comment: (NOTE) The eGFR has been calculated using the CKD EPI equation. This calculation has not been validated in all clinical situations.  eGFR's persistently <60 mL/min signify possible Chronic Kidney Disease.    Anion gap 5 5 - 15  Glucose, capillary     Status: None   Collection Time: 03/16/17  7:41 AM  Result Value Ref Range    Glucose-Capillary 72 65 - 99 mg/dL    ABGS No results for input(s): PHART, PO2ART, TCO2, HCO3 in the last 72 hours.  Invalid input(s): PCO2 CULTURES Recent Results (from the past 240 hour(s))  C difficile quick scan w PCR reflex     Status: Abnormal   Collection Time: 03/12/17  2:01 PM  Result Value Ref Range Status   C Diff antigen POSITIVE (A) NEGATIVE Final   C Diff toxin NEGATIVE NEGATIVE Final   C Diff interpretation Results are indeterminate. See PCR results.  Final  Gastrointestinal Panel by PCR , Stool     Status: None   Collection Time: 03/12/17  2:01 PM  Result Value Ref Range Status   Campylobacter species NOT DETECTED NOT DETECTED Final   Plesimonas shigelloides NOT DETECTED NOT DETECTED Final   Salmonella species NOT DETECTED NOT DETECTED Final   Yersinia enterocolitica NOT DETECTED NOT DETECTED Final   Vibrio species NOT DETECTED NOT DETECTED Final   Vibrio cholerae NOT DETECTED NOT DETECTED Final   Enteroaggregative E coli (EAEC) NOT DETECTED NOT DETECTED Final   Enteropathogenic E coli (EPEC) NOT DETECTED NOT DETECTED Final   Enterotoxigenic E coli (ETEC) NOT DETECTED NOT DETECTED Final   Shiga like toxin producing E coli (STEC) NOT DETECTED NOT DETECTED Final   Shigella/Enteroinvasive E coli (EIEC) NOT DETECTED NOT DETECTED Final   Cryptosporidium NOT DETECTED NOT DETECTED Final   Cyclospora cayetanensis NOT DETECTED NOT DETECTED Final   Entamoeba histolytica NOT DETECTED NOT DETECTED Final   Giardia lamblia NOT DETECTED NOT DETECTED Final   Adenovirus F40/41 NOT DETECTED NOT DETECTED Final   Astrovirus NOT DETECTED NOT DETECTED Final   Norovirus GI/GII NOT DETECTED NOT DETECTED Final   Rotavirus A NOT DETECTED NOT DETECTED Final   Sapovirus (I, II, IV, and V) NOT DETECTED NOT DETECTED Final  Clostridium Difficile by PCR     Status: None   Collection Time: 03/12/17  2:01 PM  Result Value Ref Range Status   Toxigenic C Difficile by pcr NEGATIVE NEGATIVE  Final    Comment: Patient is colonized with non toxigenic C. difficile. May not need treatment unless significant symptoms are present. Performed at [facility], [place] [address] [place], [place] [address]   MRSA PCR Screening     Status: None   Collection Time: 03/12/17  4:11 PM  Result Value Ref Range Status   MRSA by PCR NEGATIVE NEGATIVE Final    Comment:        The GeneXpert MRSA Assay (FDA approved for NASAL specimens only), is one component of a comprehensive MRSA colonization surveillance program. It is not intended to diagnose MRSA infection nor to guide or monitor treatment for MRSA infections.    Studies/Results: No results found.  Medications:  Prior to Admission:  Prescriptions Prior to Admission  Medication Sig Dispense Refill Last Dose  . acetaminophen (TYLENOL) 325 MG tablet Take 2 tablets (650 mg total) by mouth every 6 (six) hours as needed for mild pain (or Fever >/= 101).   03/11/2017 at Unknown time  . albuterol (PROVENTIL HFA;VENTOLIN HFA) 108 (90 Base) MCG/ACT inhaler Inhale 2 puffs into the lungs 4 (four) times daily.    unknown  . allopurinol (ZYLOPRIM) 300 MG tablet  Take 0.5 tablets (150 mg total) by mouth daily.   03/12/2017 at Unknown time  . ALPRAZolam (XANAX) 0.25 MG tablet Take 1 tablet (0.25 mg total) by mouth 2 (two) times daily as needed for anxiety. 60 tablet 0 unknown  . apixaban (ELIQUIS) 5 MG TABS tablet Take 1 tablet (5 mg total) by mouth 2 (two) times daily. 60 tablet 5 03/12/2017 at 1000  . Balsam [place]-Castor Oil (VENELEX) OINT Apply 1 application topically See admin instructions. Apply to sacrum and bilateral buttocks every shift and as needed.   03/11/2017 at Unknown time  . calcium carbonate (TUMS - DOSED IN MG ELEMENTAL CALCIUM) 500 MG chewable tablet Chew 1 tablet by mouth 2 (two) times daily.   03/12/2017 at Unknown time  . cetirizine (ZYRTEC) 10 MG tablet Take 10 mg by mouth daily.   03/12/2017 at Unknown time  . cholecalciferol  (VITAMIN D) 1000 units tablet Take 1,000 Units by mouth every morning.   03/12/2017 at Unknown time  . colchicine 0.6 MG tablet Take 1 tablet (0.6 mg total) by mouth 2 (two) times daily.   03/12/2017 at Unknown time  . diltiazem (CARDIZEM CD) 120 MG 24 hr capsule Take 1 capsule (120 mg total) by mouth daily. 90 capsule 3 03/12/2017 at Unknown time  . escitalopram (LEXAPRO) 10 MG tablet Take 10 mg by mouth daily.   03/12/2017 at Unknown time  . feeding supplement, GLUCERNA SHAKE, (GLUCERNA SHAKE) LIQD Take 237 mLs by mouth 3 (three) times daily between meals.  0 03/12/2017 at Unknown time  . fluticasone furoate-vilanterol (BREO ELLIPTA) 200-25 MCG/INH AEPB Inhale 1 puff into the lungs daily.   03/12/2017 at Unknown time  . gabapentin (NEURONTIN) 100 MG capsule Take 100 mg by mouth 2 (two) times daily.   03/12/2017 at Unknown time  . HYDROcodone-acetaminophen (NORCO) 7.5-325 MG tablet Take 1 tablet by mouth 3 (three) times daily as needed for moderate pain.   Past Week at Unknown time  . insulin glargine (LANTUS) 100 UNIT/ML injection Inject 0.2 mLs (20 Units total) into the skin daily. 10 mL 11 03/11/2017 at Unknown time  . insulin lispro (HUMALOG) 100 UNIT/ML injection Inject 3-20 Units into the skin 3 (three) times daily before meals. 121-150=1 units 151-200=2 units 201-250=3 units 251-300=5units 301-350=7units 351-400=9units   03/12/2017 at Unknown time  . levothyroxine (SYNTHROID, LEVOTHROID) 137 MCG tablet Take 137 mcg by mouth daily before breakfast.   03/12/2017 at Unknown time  . loperamide (IMODIUM A-D) 2 MG tablet Take 2 mg by mouth 4 (four) times daily as needed for diarrhea or loose stools.     . metoCLOPramide (REGLAN) 10 MG tablet Take 0.5 tablets (5 mg total) by mouth every 6 (six) hours as needed for nausea (nausea). 6 tablet 0 03/12/2017 at Unknown time  . metoprolol tartrate (LOPRESSOR) 25 MG tablet TAKE 1/2 TABLET BY MOUTH TWICE DAILY 90 tablet 1 03/12/2017 at 1000  . Multiple Vitamin  (MULTIVITAMIN WITH MINERALS) TABS tablet Take 1 tablet by mouth daily.   03/12/2017 at Unknown time  . omeprazole (PRILOSEC) 20 MG capsule TAKE ONE CAPSULE BY MOUTH DAILY 90 capsule 3 03/11/2017 at Unknown time  . ondansetron (ZOFRAN) 4 MG tablet Take 1 tablet (4 mg total) by mouth every 6 (six) hours as needed for nausea. 20 tablet 0 03/12/2017 at Unknown time  . polyvinyl alcohol (LIQUIFILM TEARS) 1.4 % ophthalmic solution Place 1 drop into both eyes 4 (four) times daily as needed for dry eyes. 15 mL 0 03/11/2017 at  Unknown time  . promethazine (PHENERGAN) 12.5 MG tablet Take 12.5 mg by mouth every 6 (six) hours as needed for nausea or vomiting.   03/12/2017 at Unknown time  . triamcinolone (NASACORT ALLERGY 24HR) 55 MCG/ACT AERO nasal inhaler Place 2 sprays into the nose daily.     . Doxepin HCl (SILENOR) 6 MG TABS Take 1 tablet (6 mg total) by mouth at bedtime as needed. 30 tablet  Past Week at Unknown time  . potassium chloride SA (K-DUR,KLOR-CON) 20 MEQ tablet Take 1 tablet (20 mEq total) by mouth daily. (Patient not taking: Reported on 03/12/2017)   Not Taking at Unknown time  . torsemide (DEMADEX) 20 MG tablet Take 1 tablet (20 mg total) by mouth 2 (two) times daily. (may take an extra '20mg'$  as needed) (Patient not taking: Reported on 03/12/2017)   Not Taking at Unknown time   Scheduled: . allopurinol  150 mg Oral Daily  . escitalopram  10 mg Oral Daily  . feeding supplement (GLUCERNA SHAKE)  237 mL Oral TID BM  . fluticasone furoate-vilanterol  1 puff Inhalation Daily  . gabapentin  100 mg Oral BID  . heparin  5,000 Units Subcutaneous Q8H  . insulin aspart  0-15 Units Subcutaneous TID WC  . insulin aspart  0-5 Units Subcutaneous QHS  . insulin glargine  20 Units Subcutaneous Daily  . levothyroxine  137 mcg Oral QAC breakfast  . metoprolol tartrate  12.5 mg Oral BID  . nystatin-triamcinolone   Topical BID  . pantoprazole  40 mg Oral Daily  . vancomycin  250 mg Oral Q6H   Continuous: .  sodium chloride 50 mL/hr at 03/15/17 1654   FVC:[id number] **OR** acetaminophen, ALPRAZolam, HYDROcodone-acetaminophen, metoCLOPramide, ondansetron **OR** ondansetron (ZOFRAN) IV  Assesment: She was admitted with dehydration and hyponatremia. She was having diarrhea and that is better. She looks much better in general. Her sodium level is now at 134. She had acute kidney injury on admission and her kidney function is approaching her baseline now. She had urinary symptoms and culture is pending. I'm not going to put her on antibiotic until I see what the culture looks like. She has diabetes which is stable. She has paroxysmal atrial fibrillation she is on chronic anticoagulation. She has had episodes of acute on chronic diastolic heart failure and her IV fluids been decreased to reduce that risk. Active Problems:   GERD (gastroesophageal reflux disease)   COPD (chronic obstructive pulmonary disease) (HCC)   Hypothyroidism   PAF (paroxysmal atrial fibrillation) (HCC)   Hyponatremia   AKI (acute kidney injury) ([place])   Diabetes mellitus type 2 in obese ([place])   Diarrhea    Plan: Continue current treatments. Continue with physical therapy.    LOS: 4 days   Samera Macy L 03/16/2017, 10:04 AM

## 2017-03-17 LAB — GLUCOSE, CAPILLARY
GLUCOSE-CAPILLARY: 72 mg/dL (ref 65–99)
Glucose-Capillary: 78 mg/dL (ref 65–99)
Glucose-Capillary: 83 mg/dL (ref 65–99)
Glucose-Capillary: 110 mg/dL — ABNORMAL HIGH (ref 65–99)

## 2017-03-17 NOTE — Progress Notes (Signed)
Subjective: She says she feels better. She has less diarrhea. No new complaints. No chest pain nausea vomiting. Her appetite is fair.  Objective: Vital signs in last 24 hours: Temp:  [97.4 F (36.3 C)-98.6 F (37 C)] 98 F (36.7 C) (06/24 0443) Pulse Rate:  [75-95] 95 (06/24 0443) Resp:  [16] 16 (06/24 0443) BP: (91-110)/(39-45) 95/42 (06/24 0443) SpO2:  [95 %-100 %] 97 % (06/24 0443) Weight change:  Last BM Date: 03/15/17  Intake/Output from previous day: 06/23 0701 - 06/24 0700 In: 1600 [P.O.:360; I.V.:1240] Out: -   PHYSICAL EXAM General appearance: alert, cooperative and no distress Resp: clear to auscultation bilaterally Cardio: regular rate and rhythm, S1, S2 normal, no murmur, click, rub or gallop GI: soft, non-tender; bowel sounds normal; no masses,  no organomegaly Extremities: extremities normal, atraumatic, no cyanosis or edema Skin warm and dry  Lab Results:  Results for orders placed or performed during the hospital encounter of 03/12/17 (from the past 48 hour(s))  Glucose, capillary     Status: None   Collection Time: 03/15/17  7:51 AM  Result Value Ref Range   Glucose-Capillary 75 65 - 99 mg/dL  Glucose, capillary     Status: None   Collection Time: 03/15/17 11:35 AM  Result Value Ref Range   Glucose-Capillary 79 65 - 99 mg/dL  Glucose, capillary     Status: None   Collection Time: 03/15/17  4:21 PM  Result Value Ref Range   Glucose-Capillary 83 65 - 99 mg/dL  Glucose, capillary     Status: None   Collection Time: 03/16/17  1:15 AM  Result Value Ref Range   Glucose-Capillary 79 65 - 99 mg/dL  CBC with Differential/Platelet     Status: Abnormal   Collection Time: 03/16/17  6:08 AM  Result Value Ref Range   WBC 5.1 4.0 - 10.5 K/uL   RBC 2.58 (L) 3.87 - 5.11 MIL/uL   Hemoglobin 8.4 (L) 12.0 - 15.0 g/dL   HCT 25.1 (L) 36.0 - 46.0 %   MCV 97.3 78.0 - 100.0 fL   MCH 32.6 26.0 - 34.0 pg   MCHC 33.5 30.0 - 36.0 g/dL   RDW 16.1 (H) 11.5 - 15.5 %   Platelets 282 150 - 400 K/uL   Neutrophils Relative % 49 %   Neutro Abs 2.5 1.7 - 7.7 K/uL   Lymphocytes Relative 35 %   Lymphs Abs 1.8 0.7 - 4.0 K/uL   Monocytes Relative 9 %   Monocytes Absolute 0.5 0.1 - 1.0 K/uL   Eosinophils Relative 7 %   Eosinophils Absolute 0.3 0.0 - 0.7 K/uL   Basophils Relative 0 %   Basophils Absolute 0.0 0.0 - 0.1 K/uL  Basic metabolic panel     Status: Abnormal   Collection Time: 03/16/17  6:08 AM  Result Value Ref Range   Sodium 134 (L) 135 - 145 mmol/L   Potassium 4.2 3.5 - 5.1 mmol/L   Chloride 111 101 - 111 mmol/L   CO2 18 (L) 22 - 32 mmol/L   Glucose, Bld 75 65 - 99 mg/dL   BUN 33 (H) 6 - 20 mg/dL   Creatinine, Ser 1.62 (H) 0.44 - 1.00 mg/dL   Calcium 8.2 (L) 8.9 - 10.3 mg/dL   GFR calc non Af Amer 30 (L) >60 mL/min   GFR calc Af Amer 35 (L) >60 mL/min    Comment: (NOTE) The eGFR has been calculated using the CKD EPI equation. This calculation has not been validated in  all clinical situations. eGFR's persistently <60 mL/min signify possible Chronic Kidney Disease.    Anion gap 5 5 - 15  Glucose, capillary     Status: None   Collection Time: 03/16/17  7:41 AM  Result Value Ref Range   Glucose-Capillary 72 65 - 99 mg/dL  Glucose, capillary     Status: None   Collection Time: 03/16/17 11:15 AM  Result Value Ref Range   Glucose-Capillary 74 65 - 99 mg/dL  Glucose, capillary     Status: None   Collection Time: 03/16/17  4:08 PM  Result Value Ref Range   Glucose-Capillary 80 65 - 99 mg/dL  Glucose, capillary     Status: None   Collection Time: 03/16/17  9:57 PM  Result Value Ref Range   Glucose-Capillary 80 65 - 99 mg/dL   Comment 1 Notify RN    Comment 2 Document in Chart   Glucose, capillary     Status: None   Collection Time: 03/17/17  7:30 AM  Result Value Ref Range   Glucose-Capillary 72 65 - 99 mg/dL    ABGS No results for input(s): PHART, PO2ART, TCO2, HCO3 in the last 72 hours.  Invalid input(s): PCO2 CULTURES Recent  Results (from the past 240 hour(s))  C difficile quick scan w PCR reflex     Status: Abnormal   Collection Time: 03/12/17  2:01 PM  Result Value Ref Range Status   C Diff antigen POSITIVE (A) NEGATIVE Final   C Diff toxin NEGATIVE NEGATIVE Final   C Diff interpretation Results are indeterminate. See PCR results.  Final  Gastrointestinal Panel by PCR , Stool     Status: None   Collection Time: 03/12/17  2:01 PM  Result Value Ref Range Status   Campylobacter species NOT DETECTED NOT DETECTED Final   Plesimonas shigelloides NOT DETECTED NOT DETECTED Final   Salmonella species NOT DETECTED NOT DETECTED Final   Yersinia enterocolitica NOT DETECTED NOT DETECTED Final   Vibrio species NOT DETECTED NOT DETECTED Final   Vibrio cholerae NOT DETECTED NOT DETECTED Final   Enteroaggregative E coli (EAEC) NOT DETECTED NOT DETECTED Final   Enteropathogenic E coli (EPEC) NOT DETECTED NOT DETECTED Final   Enterotoxigenic E coli (ETEC) NOT DETECTED NOT DETECTED Final   Shiga like toxin producing E coli (STEC) NOT DETECTED NOT DETECTED Final   Shigella/Enteroinvasive E coli (EIEC) NOT DETECTED NOT DETECTED Final   Cryptosporidium NOT DETECTED NOT DETECTED Final   Cyclospora cayetanensis NOT DETECTED NOT DETECTED Final   Entamoeba histolytica NOT DETECTED NOT DETECTED Final   Giardia lamblia NOT DETECTED NOT DETECTED Final   Adenovirus F40/41 NOT DETECTED NOT DETECTED Final   Astrovirus NOT DETECTED NOT DETECTED Final   Norovirus GI/GII NOT DETECTED NOT DETECTED Final   Rotavirus A NOT DETECTED NOT DETECTED Final   Sapovirus (I, II, IV, and V) NOT DETECTED NOT DETECTED Final  Clostridium Difficile by PCR     Status: None   Collection Time: 03/12/17  2:01 PM  Result Value Ref Range Status   Toxigenic C Difficile by pcr NEGATIVE NEGATIVE Final    Comment: Patient is colonized with non toxigenic C. difficile. May not need treatment unless significant symptoms are present. Performed at [facility], [place] [address], [place], [place] [address]   MRSA PCR Screening     Status: None   Collection Time: 03/12/17  4:11 PM  Result Value Ref Range Status   MRSA by PCR NEGATIVE NEGATIVE Final  Comment:        The GeneXpert MRSA Assay (FDA approved for NASAL specimens only), is one component of a comprehensive MRSA colonization surveillance program. It is not intended to diagnose MRSA infection nor to guide or monitor treatment for MRSA infections.    Studies/Results: No results found.  Medications:  Prior to Admission:  Prescriptions Prior to Admission  Medication Sig Dispense Refill Last Dose  . acetaminophen (TYLENOL) 325 MG tablet Take 2 tablets (650 mg total) by mouth every 6 (six) hours as needed for mild pain (or Fever >/= 101).   03/11/2017 at Unknown time  . albuterol (PROVENTIL HFA;VENTOLIN HFA) 108 (90 Base) MCG/ACT inhaler Inhale 2 puffs into the lungs 4 (four) times daily.    unknown  . allopurinol (ZYLOPRIM) 300 MG tablet Take 0.5 tablets (150 mg total) by mouth daily.   03/12/2017 at Unknown time  . ALPRAZolam (XANAX) 0.25 MG tablet Take 1 tablet (0.25 mg total) by mouth 2 (two) times daily as needed for anxiety. 60 tablet 0 unknown  . apixaban (ELIQUIS) 5 MG TABS tablet Take 1 tablet (5 mg total) by mouth 2 (two) times daily. 60 tablet 5 03/12/2017 at 1000  . Balsam [place]-Castor Oil (VENELEX) OINT Apply 1 application topically See admin instructions. Apply to sacrum and bilateral buttocks every shift and as needed.   03/11/2017 at Unknown time  . calcium carbonate (TUMS - DOSED IN MG ELEMENTAL CALCIUM) 500 MG chewable tablet Chew 1 tablet by mouth 2 (two) times daily.   03/12/2017 at Unknown time  . cetirizine (ZYRTEC) 10 MG tablet Take 10 mg by mouth daily.   03/12/2017 at Unknown time  . cholecalciferol (VITAMIN D) 1000 units tablet Take 1,000 Units by mouth every morning.   03/12/2017 at Unknown time  . colchicine 0.6 MG tablet Take 1 tablet (0.6 mg total) by mouth 2 (two)  times daily.   03/12/2017 at Unknown time  . diltiazem (CARDIZEM CD) 120 MG 24 hr capsule Take 1 capsule (120 mg total) by mouth daily. 90 capsule 3 03/12/2017 at Unknown time  . escitalopram (LEXAPRO) 10 MG tablet Take 10 mg by mouth daily.   03/12/2017 at Unknown time  . feeding supplement, GLUCERNA SHAKE, (GLUCERNA SHAKE) LIQD Take 237 mLs by mouth 3 (three) times daily between meals.  0 03/12/2017 at Unknown time  . fluticasone furoate-vilanterol (BREO ELLIPTA) 200-25 MCG/INH AEPB Inhale 1 puff into the lungs daily.   03/12/2017 at Unknown time  . gabapentin (NEURONTIN) 100 MG capsule Take 100 mg by mouth 2 (two) times daily.   03/12/2017 at Unknown time  . HYDROcodone-acetaminophen (NORCO) 7.5-325 MG tablet Take 1 tablet by mouth 3 (three) times daily as needed for moderate pain.   Past Week at Unknown time  . insulin glargine (LANTUS) 100 UNIT/ML injection Inject 0.2 mLs (20 Units total) into the skin daily. 10 mL 11 03/11/2017 at Unknown time  . insulin lispro (HUMALOG) 100 UNIT/ML injection Inject 3-20 Units into the skin 3 (three) times daily before meals. 121-150=1 units 151-200=2 units 201-250=3 units 251-300=5units 301-350=7units 351-400=9units   03/12/2017 at Unknown time  . levothyroxine (SYNTHROID, LEVOTHROID) 137 MCG tablet Take 137 mcg by mouth daily before breakfast.   03/12/2017 at Unknown time  . loperamide (IMODIUM A-D) 2 MG tablet Take 2 mg by mouth 4 (four) times daily as needed for diarrhea or loose stools.     . metoCLOPramide (REGLAN) 10 MG tablet Take 0.5 tablets (5 mg total) by mouth every 6 (six)  hours as needed for nausea (nausea). 6 tablet 0 03/12/2017 at Unknown time  . metoprolol tartrate (LOPRESSOR) 25 MG tablet TAKE 1/2 TABLET BY MOUTH TWICE DAILY 90 tablet 1 03/12/2017 at 1000  . Multiple Vitamin (MULTIVITAMIN WITH MINERALS) TABS tablet Take 1 tablet by mouth daily.   03/12/2017 at Unknown time  . omeprazole (PRILOSEC) 20 MG capsule TAKE ONE CAPSULE BY MOUTH DAILY 90  capsule 3 03/11/2017 at Unknown time  . ondansetron (ZOFRAN) 4 MG tablet Take 1 tablet (4 mg total) by mouth every 6 (six) hours as needed for nausea. 20 tablet 0 03/12/2017 at Unknown time  . polyvinyl alcohol (LIQUIFILM TEARS) 1.4 % ophthalmic solution Place 1 drop into both eyes 4 (four) times daily as needed for dry eyes. 15 mL 0 03/11/2017 at Unknown time  . promethazine (PHENERGAN) 12.5 MG tablet Take 12.5 mg by mouth every 6 (six) hours as needed for nausea or vomiting.   03/12/2017 at Unknown time  . triamcinolone (NASACORT ALLERGY 24HR) 55 MCG/ACT AERO nasal inhaler Place 2 sprays into the nose daily.     . Doxepin HCl (SILENOR) 6 MG TABS Take 1 tablet (6 mg total) by mouth at bedtime as needed. 30 tablet  Past Week at Unknown time  . potassium chloride SA (K-DUR,KLOR-CON) 20 MEQ tablet Take 1 tablet (20 mEq total) by mouth daily. (Patient not taking: Reported on 03/12/2017)   Not Taking at Unknown time  . torsemide (DEMADEX) 20 MG tablet Take 1 tablet (20 mg total) by mouth 2 (two) times daily. (may take an extra '20mg'$  as needed) (Patient not taking: Reported on 03/12/2017)   Not Taking at Unknown time   Scheduled: . allopurinol  150 mg Oral Daily  . escitalopram  10 mg Oral Daily  . feeding supplement (GLUCERNA SHAKE)  237 mL Oral TID BM  . fluticasone furoate-vilanterol  1 puff Inhalation Daily  . gabapentin  100 mg Oral BID  . heparin  5,000 Units Subcutaneous Q8H  . insulin aspart  0-15 Units Subcutaneous TID WC  . insulin aspart  0-5 Units Subcutaneous QHS  . insulin glargine  20 Units Subcutaneous Daily  . levothyroxine  137 mcg Oral QAC breakfast  . metoprolol tartrate  12.5 mg Oral BID  . nystatin-triamcinolone   Topical BID  . pantoprazole  40 mg Oral Daily  . vancomycin  250 mg Oral Q6H   Continuous: . sodium chloride 50 mL/hr at 03/15/17 1654   WOE:[id number] **OR** acetaminophen, ALPRAZolam, HYDROcodone-acetaminophen, metoCLOPramide, ondansetron **OR** ondansetron  (ZOFRAN) IV  Assesment: She was admitted with diarrhea and is being treated for C. difficile. She is improving. She had failure to thrive at the nursing home became severely dehydrated and hyponatremic. She is much improved. She doesn't appear to be dehydrated. She had acute kidney injury and her renal function is improving. Her appetite is fair. Active Problems:   GERD (gastroesophageal reflux disease)   COPD (chronic obstructive pulmonary disease) (HCC)   Hypothyroidism   PAF (paroxysmal atrial fibrillation) (HCC)   Hyponatremia   AKI (acute kidney injury) ([place])   Diabetes mellitus type 2 in obese ([place])   Diarrhea    Plan: Continue current treatments. Recheck labs in the morning. Transfer back to skilled care facility in the next 48 hours unless some other problem intervenes    LOS: 5 days   Yolanda [name] L 03/17/2017, 7:46 AM

## 2017-03-18 DIAGNOSIS — A09 Infectious gastroenteritis and colitis, unspecified: Secondary | ICD-10-CM

## 2017-03-18 LAB — CBC WITH DIFFERENTIAL/PLATELET
Basophils Absolute: 0 10*3/uL (ref 0.0–0.1)
Basophils Relative: 0 %
Eosinophils Absolute: 0.2 10*3/uL (ref 0.0–0.7)
Eosinophils Relative: 5 %
HEMATOCRIT: 24.1 % — AB (ref 36.0–46.0)
HEMOGLOBIN: 8.1 g/dL — AB (ref 12.0–15.0)
LYMPHS ABS: 1.4 10*3/uL (ref 0.7–4.0)
LYMPHS PCT: 29 %
MCH: 32.7 pg (ref 26.0–34.0)
MCHC: 33.6 g/dL (ref 30.0–36.0)
MCV: 97.2 fL (ref 78.0–100.0)
MONOS PCT: 9 %
Monocytes Absolute: 0.4 10*3/uL (ref 0.1–1.0)
NEUTROS ABS: 2.7 10*3/uL (ref 1.7–7.7)
NEUTROS PCT: 57 %
Platelets: 253 10*3/uL (ref 150–400)
RBC: 2.48 MIL/uL — ABNORMAL LOW (ref 3.87–5.11)
RDW: 16.3 % — ABNORMAL HIGH (ref 11.5–15.5)
WBC: 4.7 10*3/uL (ref 4.0–10.5)

## 2017-03-18 LAB — BASIC METABOLIC PANEL
Anion gap: 9 (ref 5–15)
BUN: 21 mg/dL — ABNORMAL HIGH (ref 6–20)
CHLORIDE: 113 mmol/L — AB (ref 101–111)
CO2: 15 mmol/L — ABNORMAL LOW (ref 22–32)
CREATININE: 1.15 mg/dL — AB (ref 0.44–1.00)
Calcium: 8.3 mg/dL — ABNORMAL LOW (ref 8.9–10.3)
GFR calc non Af Amer: 46 mL/min — ABNORMAL LOW (ref >60)
GFR, EST AFRICAN AMERICAN: 53 mL/min — AB (ref >60)
Glucose, Bld: 94 mg/dL (ref 65–99)
POTASSIUM: 3.8 mmol/L (ref 3.5–5.1)
Sodium: 137 mmol/L (ref 135–145)

## 2017-03-18 LAB — GLUCOSE, CAPILLARY
GLUCOSE-CAPILLARY: 83 mg/dL (ref 65–99)
GLUCOSE-CAPILLARY: 92 mg/dL (ref 65–99)
Glucose-Capillary: 89 mg/dL (ref 65–99)
Glucose-Capillary: 84 mg/dL (ref 65–99)

## 2017-03-18 LAB — URINE CULTURE

## 2017-03-18 MED ORDER — ONDANSETRON 4 MG PO TBDP
4.0000 mg | ORAL_TABLET | Freq: Three times a day (TID) | ORAL | Status: DC
  Administered 2017-03-18 – 2017-03-20 (×5): 4 mg via ORAL
  Filled 2017-03-18 (×5): qty 1

## 2017-03-18 MED ORDER — FOSFOMYCIN TROMETHAMINE 3 G PO PACK
3.0000 g | PACK | Freq: Once | ORAL | Status: AC
  Administered 2017-03-18: 3 g via ORAL
  Filled 2017-03-18: qty 3

## 2017-03-18 NOTE — NC FL2 (Signed)
[place] MEDICAID FL2 LEVEL OF CARE SCREENING TOOL     IDENTIFICATION  Patient Name: Yolanda [name] Birthdate: 1941-07-29 Sex: female Admission Date (Current Location): 03/12/2017  [facility] and [place] Number:  [employer] and Address:  [facility],  [address], [place] [address]      Provider Number: [id number]  Attending Physician Name and Address:  Kari Baars, MD  Relative Name and Phone Number:       Current Level of Care: Hospital Recommended Level of Care: Skilled Nursing Facility Prior Approval Number:    Date Approved/Denied:   PASRR Number: [id number]  Discharge Plan: SNF    Current Diagnoses: Patient Active Problem List   Diagnosis Date Noted  . Diarrhea 03/12/2017  . Schatzki's ring 02/06/2017  . Gastritis 02/06/2017  . Esophageal dysphagia   . Elevated LFTs 01/28/2017  . Diabetes mellitus type 2 in obese (HCC) 01/28/2017  . Metabolic encephalopathy 01/01/2017  . Acute encephalopathy 12/30/2016  . Acute lower UTI 12/30/2016  . Pain, dental 12/30/2016  . Spasm of back muscles 12/30/2016  . Hyperglycemia 12/30/2016  . Gout attack 09/07/2016  . Chronic anticoagulation 02/02/2016  . Chronic edema 02/02/2016  . Obesity 02/02/2016  . Sleep apnea 02/02/2016  . RBBB 02/02/2016  . Acute on chronic diastolic heart failure (HCC) 01/13/2016  . Hyponatremia 01/06/2016  . AKI (acute kidney injury) (HCC) 01/06/2016  . Cellulitis of left lower extremity 12/15/2014  . PAF (paroxysmal atrial fibrillation) (HCC) 11/09/2014  . COPD (chronic obstructive pulmonary disease) (HCC) 11/06/2014  . Neuropathic pain 11/06/2014  . Hypothyroidism 11/06/2014  . Hyperkalemia 11/06/2014  . HTN (hypertension) 01/12/2014  . Upper airway cough syndrome 09/03/2013  . GERD (gastroesophageal reflux disease) 11/13/2011  . Short-segment Barrett's esophagus 11/13/2011    Orientation RESPIRATION BLADDER Height & Weight     Self, Time, Situation,  Place  Normal Incontinent Weight: 194 lb 14.2 oz (88.4 kg) Height:  5\' 1"  (154.9 cm)  BEHAVIORAL SYMPTOMS/MOOD NEUROLOGICAL BOWEL NUTRITION STATUS      Incontinent Diet (regular/see DC summary)  AMBULATORY STATUS COMMUNICATION OF NEEDS Skin   Extensive Assist Verbally Normal                       Personal Care Assistance Level of Assistance  Bathing, Feeding, Dressing Bathing Assistance: Limited assistance Feeding assistance: Independent Dressing Assistance: Limited assistance     Functional Limitations Info  Sight, Hearing, Speech Sight Info: Adequate Hearing Info: Adequate Speech Info: Adequate    SPECIAL CARE FACTORS FREQUENCY  PT (By licensed PT), OT (By licensed OT)     PT Frequency: 5x OT Frequency: 5x            Contractures Contractures Info: Not present    Additional Factors Info  Allergies   Allergies Info: Ciprofloxacin, Doxycycline, Fish Oil, Guaifenesin, Mucinex Guaifenesin Er, Sulfamethoxazole W/trimethoprim 800-160 Sulfamethoxazole-trimethoprim, Uloric Febuxostat, Adhesive Tape, Allopurinol, Cefuroxime Axetil, Metolazone, Tramadol           Current Medications (03/18/2017):  This is the current hospital active medication list Current Facility-Administered Medications  Medication Dose Route Frequency Provider Last Rate Last Dose  . 0.9 %  sodium chloride infusion   Intravenous Continuous Malissa Hippo, MD 50 mL/hr at 03/15/17 1654    . acetaminophen (TYLENOL) tablet 650 mg  650 mg Oral Q6H PRN Erick Blinks, MD       Or  . acetaminophen (TYLENOL) suppository 650 mg  650 mg Rectal Q6H PRN Memon,  Durward MallardJehanzeb, MD      . allopurinol (ZYLOPRIM) tablet 150 mg  150 mg Oral Daily Erick BlinksMemon, Jehanzeb, MD   150 mg at 03/18/17 0932  . ALPRAZolam Prudy Feeler(XANAX) tablet 0.25 mg  0.25 mg Oral BID PRN Erick BlinksMemon, Jehanzeb, MD   0.25 mg at 03/15/17 2311  . escitalopram (LEXAPRO) tablet 10 mg  10 mg Oral Daily Erick BlinksMemon, Jehanzeb, MD   10 mg at 03/18/17 0932  . feeding supplement  (GLUCERNA SHAKE) (GLUCERNA SHAKE) liquid 237 mL  237 mL Oral TID BM Erick BlinksMemon, Jehanzeb, MD   237 mL at 03/16/17 1400  . fluticasone furoate-vilanterol (BREO ELLIPTA) 200-25 MCG/INH 1 puff  1 puff Inhalation Daily Erick BlinksMemon, Jehanzeb, MD   1 puff at 03/18/17 0723  . gabapentin (NEURONTIN) capsule 100 mg  100 mg Oral BID Erick BlinksMemon, Jehanzeb, MD   100 mg at 03/18/17 0934  . heparin injection 5,000 Units  5,000 Units Subcutaneous Q8H Erick BlinksMemon, Jehanzeb, MD   5,000 Units at 03/18/17 0607  . HYDROcodone-acetaminophen (NORCO) 7.5-325 MG per tablet 1 tablet  1 tablet Oral TID PRN Erick BlinksMemon, Jehanzeb, MD   1 tablet at 03/15/17 2312  . insulin aspart (novoLOG) injection 0-15 Units  0-15 Units Subcutaneous TID WC Memon, Jehanzeb, MD      . insulin aspart (novoLOG) injection 0-5 Units  0-5 Units Subcutaneous QHS Memon, Jehanzeb, MD      . insulin glargine (LANTUS) injection 20 Units  20 Units Subcutaneous Daily Erick BlinksMemon, Jehanzeb, MD   20 Units at 03/13/17 0959  . levothyroxine (SYNTHROID, LEVOTHROID) tablet 137 mcg  137 mcg Oral QAC breakfast Erick BlinksMemon, Jehanzeb, MD   137 mcg at 03/18/17 0933  . metoCLOPramide (REGLAN) tablet 5 mg  5 mg Oral Q6H PRN Erick BlinksMemon, Jehanzeb, MD      . metoprolol tartrate (LOPRESSOR) tablet 12.5 mg  12.5 mg Oral BID Erick BlinksMemon, Jehanzeb, MD   12.5 mg at 03/18/17 0933  . nystatin-triamcinolone (MYCOLOG II) cream   Topical BID Rehman, Najeeb U, MD      . ondansetron (ZOFRAN) tablet 4 mg  4 mg Oral Q6H PRN Erick BlinksMemon, Jehanzeb, MD       Or  . ondansetron (ZOFRAN) injection 4 mg  4 mg Intravenous Q6H PRN Erick BlinksMemon, Jehanzeb, MD   4 mg at 03/18/17 0607  . ondansetron (ZOFRAN-ODT) disintegrating tablet 4 mg  4 mg Oral TID AC Gelene MinkBoone, Anna W, NP      . pantoprazole (PROTONIX) EC tablet 40 mg  40 mg Oral Daily Erick BlinksMemon, Jehanzeb, MD   40 mg at 03/18/17 0934  . vancomycin (VANCOCIN) 50 mg/mL oral solution 250 mg  250 mg Oral Q6H Rehman, Joline MaxcyNajeeb U, MD   250 mg at 03/18/17 [id number]     Discharge Medications: Please see discharge summary for  a list of discharge medications.  Relevant Imaging Results:  Relevant Lab Results:   Additional Information SSN [id number]      Athene Schuhmacher, Evlyn CourierHannah N, [place]CS[place]

## 2017-03-18 NOTE — Progress Notes (Signed)
REVIEWED-NO ADDITIONAL RECOMMENDATIONS.   Subjective: Diarrhea is improving. Some nausea with vomiting this morning. No abdominal pain. Nausea improved at time of visit this morning.   Objective: Vital signs in last 24 hours: Temp:  [98.5 F (36.9 C)-98.7 F (37.1 C)] 98.5 F (36.9 C) (06/25 0428) Pulse Rate:  [98-108] 108 (06/25 0428) Resp:  [16-18] 18 (06/25 0428) BP: (109-122)/(43-48) 109/43 (06/25 0428) SpO2:  [96 %-98 %] 96 % (06/25 0723) Last BM Date: 03/15/17 General:   Alert and oriented, pleasant Head:  Normocephalic and atraumatic. Eyes:  No icterus, sclera clear. Conjuctiva pink.  Abdomen:  Bowel sounds present, soft, non-tender, non-distended.  Extremities:  With 1-2+ LEE . Neurologic:  Alert and  oriented x4 Psych:  Alert and cooperative. Normal mood and affect.  Intake/Output from previous day: 06/24 0701 - 06/25 0700 In: 720 [P.O.:720] Out: 200 [Urine:200] Intake/Output this shift: No intake/output data recorded.  Lab Results:  Recent Labs  03/16/17 0608  WBC 5.1  HGB 8.4*  HCT 25.1*  PLT 282   BMET  Recent Labs  03/16/17 0608  NA 134*  K 4.2  CL 111  CO2 18*  GLUCOSE 75  BUN 33*  CREATININE 1.62*  CALCIUM 8.2*      Assessment: 76 year old female with recurrent diarrhea and empirically treated now for CDI, as she was exposed to antbiotics twice in past 6 months for cellulitis. Stool testing with positive antigen, negative toxin, and PCR negative. Clinically improved with decreasing diarrhea. Some nausea/vomiting this morning. UTI noted and attending to discuss with pharmacy due to multiple allergies. N/V multifactorial in this setting but overall, improving from a diarrheal standpoint.   She has been on a PPI, which is not ideal in the setting of treatment for CDI (nor are H2 blockers). 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   Plan: Will add Zofran scheduled before meals Vancomycin 250 mg QID for total of 2 weeks followed by 250 mg BID per Dr. Karilyn Cotaehman, with follow-up as outpatient in 2 weeks with Dr. Karilyn Cotaehman for further dosing/tapering.   Gelene MinkAnna W. Boone, PhD, ANP-BC [facility]      LOS: 6 days    03/18/2017, 8:24 AM

## 2017-03-18 NOTE — Progress Notes (Signed)
LCSW following for disposition.  Patient is from [facility]. Patient will need re-auth for insurance to continue approving SNF rehab admission. LCSW will being work up for [employer] insurance to see if they will approve additional stay.  Patient continues to need acute care stabilization.  Will follow up and continue assisting with disposition.  Yolanda EmoryHannah Kewanda Poland LCSW, MSW Clinical Social Work: Optician, dispensingystem [employer] Coverage for :  [phone]

## 2017-03-18 NOTE — Progress Notes (Addendum)
Subjective: She says she's had some nausea and vomited this morning. Her diarrhea is getting better. She has no other new complaints. No chest pain. No shortness of breath. No abdominal pain  Objective: Vital signs in last 24 hours: Temp:  [98.5 F (36.9 C)-98.7 F (37.1 C)] 98.5 F (36.9 C) (06/25 0428) Pulse Rate:  [98-108] 108 (06/25 0428) Resp:  [16-18] 18 (06/25 0428) BP: (109-122)/(43-48) 109/43 (06/25 0428) SpO2:  [96 %-98 %] 96 % (06/25 0723) Weight change:  Last BM Date: 03/15/17  Intake/Output from previous day: 06/24 0701 - 06/25 0700 In: 720 [P.O.:720] Out: 200 [Urine:200]  PHYSICAL EXAM General appearance: alert, cooperative, mild distress and morbidly obese Resp: clear to auscultation bilaterally Cardio: regular rate and rhythm, S1, S2 normal, no murmur, click, rub or gallop GI: soft, non-tender; bowel sounds normal; no masses,  no organomegaly Extremities: extremities normal, atraumatic, no cyanosis or edema Skin warm and dry  Lab Results:  Results for orders placed or performed during the hospital encounter of 03/12/17 (from the past 48 hour(s))  Glucose, capillary     Status: None   Collection Time: 03/16/17 11:15 AM  Result Value Ref Range   Glucose-Capillary 74 65 - 99 mg/dL  Glucose, capillary     Status: None   Collection Time: 03/16/17  4:08 PM  Result Value Ref Range   Glucose-Capillary 80 65 - 99 mg/dL  Glucose, capillary     Status: None   Collection Time: 03/16/17  9:57 PM  Result Value Ref Range   Glucose-Capillary 80 65 - 99 mg/dL   Comment 1 Notify RN    Comment 2 Document in Chart   Glucose, capillary     Status: None   Collection Time: 03/17/17  7:30 AM  Result Value Ref Range   Glucose-Capillary 72 65 - 99 mg/dL  Glucose, capillary     Status: None   Collection Time: 03/17/17 11:17 AM  Result Value Ref Range   Glucose-Capillary 78 65 - 99 mg/dL  Glucose, capillary     Status: None   Collection Time: 03/17/17  4:12 PM  Result  Value Ref Range   Glucose-Capillary 83 65 - 99 mg/dL  Glucose, capillary     Status: Abnormal   Collection Time: 03/17/17  9:58 PM  Result Value Ref Range   Glucose-Capillary 110 (H) 65 - 99 mg/dL   Comment 1 Notify RN    Comment 2 Document in Chart   Glucose, capillary     Status: None   Collection Time: 03/18/17  7:47 AM  Result Value Ref Range   Glucose-Capillary 83 65 - 99 mg/dL   Comment 1 Notify RN    Comment 2 Document in Chart     ABGS No results for input(s): PHART, PO2ART, TCO2, HCO3 in the last 72 hours.  Invalid input(s): PCO2 CULTURES Recent Results (from the past 240 hour(s))  C difficile quick scan w PCR reflex     Status: Abnormal   Collection Time: 03/12/17  2:01 PM  Result Value Ref Range Status   C Diff antigen POSITIVE (A) NEGATIVE Final   C Diff toxin NEGATIVE NEGATIVE Final   C Diff interpretation Results are indeterminate. See PCR results.  Final  Gastrointestinal Panel by PCR , Stool     Status: None   Collection Time: 03/12/17  2:01 PM  Result Value Ref Range Status   Campylobacter species NOT DETECTED NOT DETECTED Final   Plesimonas shigelloides NOT DETECTED NOT DETECTED Final   Salmonella  species NOT DETECTED NOT DETECTED Final   Yersinia enterocolitica NOT DETECTED NOT DETECTED Final   Vibrio species NOT DETECTED NOT DETECTED Final   Vibrio cholerae NOT DETECTED NOT DETECTED Final   Enteroaggregative E coli (EAEC) NOT DETECTED NOT DETECTED Final   Enteropathogenic E coli (EPEC) NOT DETECTED NOT DETECTED Final   Enterotoxigenic E coli (ETEC) NOT DETECTED NOT DETECTED Final   Shiga like toxin producing E coli (STEC) NOT DETECTED NOT DETECTED Final   Shigella/Enteroinvasive E coli (EIEC) NOT DETECTED NOT DETECTED Final   Cryptosporidium NOT DETECTED NOT DETECTED Final   Cyclospora cayetanensis NOT DETECTED NOT DETECTED Final   Entamoeba histolytica NOT DETECTED NOT DETECTED Final   Giardia lamblia NOT DETECTED NOT DETECTED Final   Adenovirus  F40/41 NOT DETECTED NOT DETECTED Final   Astrovirus NOT DETECTED NOT DETECTED Final   Norovirus GI/GII NOT DETECTED NOT DETECTED Final   Rotavirus A NOT DETECTED NOT DETECTED Final   Sapovirus (I, II, IV, and V) NOT DETECTED NOT DETECTED Final  Clostridium Difficile by PCR     Status: None   Collection Time: 03/12/17  2:01 PM  Result Value Ref Range Status   Toxigenic C Difficile by pcr NEGATIVE NEGATIVE Final    Comment: Patient is colonized with non toxigenic C. difficile. May not need treatment unless significant symptoms are present. Performed at [facility], [address], [place], [place] [address]   MRSA PCR Screening     Status: None   Collection Time: 03/12/17  4:11 PM  Result Value Ref Range Status   MRSA by PCR NEGATIVE NEGATIVE Final    Comment:        The GeneXpert MRSA Assay (FDA approved for NASAL specimens only), is one component of a comprehensive MRSA colonization surveillance program. It is not intended to diagnose MRSA infection nor to guide or monitor treatment for MRSA infections.   Urine Culture     Status: Abnormal (Preliminary result)   Collection Time: 03/14/17  6:01 PM  Result Value Ref Range Status   Specimen Description URINE, CLEAN CATCH  Final   Special Requests NONE  Final   Culture (A)  Final    >=100,000 COLONIES/mL KLEBSIELLA PNEUMONIAE 50,000 COLONIES/mL CITROBACTER AMALONATICUS    Report Status PENDING  Incomplete   Studies/Results: No results found.  Medications:  Prior to Admission:  Prescriptions Prior to Admission  Medication Sig Dispense Refill Last Dose  . acetaminophen (TYLENOL) 325 MG tablet Take 2 tablets (650 mg total) by mouth every 6 (six) hours as needed for mild pain (or Fever >/= 101).   03/11/2017 at Unknown time  . albuterol (PROVENTIL HFA;VENTOLIN HFA) 108 (90 Base) MCG/ACT inhaler Inhale 2 puffs into the lungs 4 (four) times daily.    unknown  . allopurinol (ZYLOPRIM) 300 MG tablet Take 0.5 tablets (150 mg  total) by mouth daily.   03/12/2017 at Unknown time  . ALPRAZolam (XANAX) 0.25 MG tablet Take 1 tablet (0.25 mg total) by mouth 2 (two) times daily as needed for anxiety. 60 tablet 0 unknown  . apixaban (ELIQUIS) 5 MG TABS tablet Take 1 tablet (5 mg total) by mouth 2 (two) times daily. 60 tablet 5 03/12/2017 at 1000  . Balsam Peru-Castor Oil (VENELEX) OINT Apply 1 application topically See admin instructions. Apply to sacrum and bilateral buttocks every shift and as needed.   03/11/2017 at Unknown time  . calcium carbonate (TUMS - DOSED IN MG ELEMENTAL CALCIUM) 500 MG chewable tablet Chew 1 tablet  by mouth 2 (two) times daily.   03/12/2017 at Unknown time  . cetirizine (ZYRTEC) 10 MG tablet Take 10 mg by mouth daily.   03/12/2017 at Unknown time  . cholecalciferol (VITAMIN D) 1000 units tablet Take 1,000 Units by mouth every morning.   03/12/2017 at Unknown time  . colchicine 0.6 MG tablet Take 1 tablet (0.6 mg total) by mouth 2 (two) times daily.   03/12/2017 at Unknown time  . diltiazem (CARDIZEM CD) 120 MG 24 hr capsule Take 1 capsule (120 mg total) by mouth daily. 90 capsule 3 03/12/2017 at Unknown time  . escitalopram (LEXAPRO) 10 MG tablet Take 10 mg by mouth daily.   03/12/2017 at Unknown time  . feeding supplement, GLUCERNA SHAKE, (GLUCERNA SHAKE) LIQD Take 237 mLs by mouth 3 (three) times daily between meals.  0 03/12/2017 at Unknown time  . fluticasone furoate-vilanterol (BREO ELLIPTA) 200-25 MCG/INH AEPB Inhale 1 puff into the lungs daily.   03/12/2017 at Unknown time  . gabapentin (NEURONTIN) 100 MG capsule Take 100 mg by mouth 2 (two) times daily.   03/12/2017 at Unknown time  . HYDROcodone-acetaminophen (NORCO) 7.5-325 MG tablet Take 1 tablet by mouth 3 (three) times daily as needed for moderate pain.   Past Week at Unknown time  . insulin glargine (LANTUS) 100 UNIT/ML injection Inject 0.2 mLs (20 Units total) into the skin daily. 10 mL 11 03/11/2017 at Unknown time  . insulin lispro (HUMALOG) 100  UNIT/ML injection Inject 3-20 Units into the skin 3 (three) times daily before meals. 121-150=1 units 151-200=2 units 201-250=3 units 251-300=5units 301-350=7units 351-400=9units   03/12/2017 at Unknown time  . levothyroxine (SYNTHROID, LEVOTHROID) 137 MCG tablet Take 137 mcg by mouth daily before breakfast.   03/12/2017 at Unknown time  . loperamide (IMODIUM A-D) 2 MG tablet Take 2 mg by mouth 4 (four) times daily as needed for diarrhea or loose stools.     . metoCLOPramide (REGLAN) 10 MG tablet Take 0.5 tablets (5 mg total) by mouth every 6 (six) hours as needed for nausea (nausea). 6 tablet 0 03/12/2017 at Unknown time  . metoprolol tartrate (LOPRESSOR) 25 MG tablet TAKE 1/2 TABLET BY MOUTH TWICE DAILY 90 tablet 1 03/12/2017 at 1000  . Multiple Vitamin (MULTIVITAMIN WITH MINERALS) TABS tablet Take 1 tablet by mouth daily.   03/12/2017 at Unknown time  . omeprazole (PRILOSEC) 20 MG capsule TAKE ONE CAPSULE BY MOUTH DAILY 90 capsule 3 03/11/2017 at Unknown time  . ondansetron (ZOFRAN) 4 MG tablet Take 1 tablet (4 mg total) by mouth every 6 (six) hours as needed for nausea. 20 tablet 0 03/12/2017 at Unknown time  . polyvinyl alcohol (LIQUIFILM TEARS) 1.4 % ophthalmic solution Place 1 drop into both eyes 4 (four) times daily as needed for dry eyes. 15 mL 0 03/11/2017 at Unknown time  . promethazine (PHENERGAN) 12.5 MG tablet Take 12.5 mg by mouth every 6 (six) hours as needed for nausea or vomiting.   03/12/2017 at Unknown time  . triamcinolone (NASACORT ALLERGY 24HR) 55 MCG/ACT AERO nasal inhaler Place 2 sprays into the nose daily.     . Doxepin HCl (SILENOR) 6 MG TABS Take 1 tablet (6 mg total) by mouth at bedtime as needed. 30 tablet  Past Week at Unknown time  . potassium chloride SA (K-DUR,KLOR-CON) 20 MEQ tablet Take 1 tablet (20 mEq total) by mouth daily. (Patient not taking: Reported on 03/12/2017)   Not Taking at Unknown time  . torsemide (DEMADEX) 20 MG tablet Take  1 tablet (20 mg total) by mouth 2  (two) times daily. (may take an extra 20mg  as needed) (Patient not taking: Reported on 03/12/2017)   Not Taking at Unknown time   Scheduled: . allopurinol  150 mg Oral Daily  . escitalopram  10 mg Oral Daily  . feeding supplement (GLUCERNA SHAKE)  237 mL Oral TID BM  . fluticasone furoate-vilanterol  1 puff Inhalation Daily  . gabapentin  100 mg Oral BID  . heparin  5,000 Units Subcutaneous Q8H  . insulin aspart  0-15 Units Subcutaneous TID WC  . insulin aspart  0-5 Units Subcutaneous QHS  . insulin glargine  20 Units Subcutaneous Daily  . levothyroxine  137 mcg Oral QAC breakfast  . metoprolol tartrate  12.5 mg Oral BID  . nystatin-triamcinolone   Topical BID  . pantoprazole  40 mg Oral Daily  . vancomycin  250 mg Oral Q6H   Continuous: . sodium chloride 50 mL/hr at 03/15/17 1654   ONG:[id number]:acetaminophen **OR** acetaminophen, ALPRAZolam, HYDROcodone-acetaminophen, metoCLOPramide, ondansetron **OR** ondansetron (ZOFRAN) IV  Assesment: She was admitted with severe diarrhea dehydration and hyponatremia. It is felt that she likely has C. difficile. She's being treated for that. She is improving with treatment. She is now documented to have a urinary tract infection and has 2 different organisms. I was reluctant to start her on antibiotics until we had identification but I'm going to need to start her on antibiotics now. We don't have sensitivities but I will Discuss with pharmacy because she has multiple allergies to medications. She's had some nausea and vomiting. Her sodium level has improved. Active Problems:   GERD (gastroesophageal reflux disease)   COPD (chronic obstructive pulmonary disease) (HCC)   Hypothyroidism   PAF (paroxysmal atrial fibrillation) (HCC)   Hyponatremia   AKI (acute kidney injury) (HCC)   Diabetes mellitus type 2 in obese (HCC)   Diarrhea    Plan: Start Antibiotics. Continue other treatments. Continue with physical therapy. Repeat blood testing tomorrow     LOS: 6 days   Sian Joles L 03/18/2017, 8:31 AM

## 2017-03-19 ENCOUNTER — Telehealth: Payer: Self-pay | Admitting: Gastroenterology

## 2017-03-19 LAB — GLUCOSE, CAPILLARY
GLUCOSE-CAPILLARY: 99 mg/dL (ref 65–99)
Glucose-Capillary: 97 mg/dL (ref 65–99)
Glucose-Capillary: 89 mg/dL (ref 65–99)
Glucose-Capillary: 105 mg/dL — ABNORMAL HIGH (ref 65–99)

## 2017-03-19 MED ORDER — SODIUM BICARBONATE 650 MG PO TABS
650.0000 mg | ORAL_TABLET | Freq: Two times a day (BID) | ORAL | Status: DC
  Administered 2017-03-19 – 2017-03-20 (×3): 650 mg via ORAL
  Filled 2017-03-19 (×3): qty 1

## 2017-03-19 MED ORDER — ESCITALOPRAM OXALATE 10 MG PO TABS
20.0000 mg | ORAL_TABLET | Freq: Every day | ORAL | Status: DC
  Administered 2017-03-19 – 2017-03-20 (×2): 20 mg via ORAL
  Filled 2017-03-19 (×2): qty 2

## 2017-03-19 NOTE — Progress Notes (Signed)
This note is to document a phone call with this pt's provider, Dr. Juanetta GoslingHawkins, who requested psych consult for patient per family request to modify her depression medication. Originally, this was listed as a TTS consult and my team was not notified that an NP needed to see the pt. Therefore, I called Dr. Juanetta GoslingHawkins personally to discuss the case.  Per Dr. Juanetta GoslingHawkins, patient has not been well managed with her current depression medication and they no longer have a psychiatrist visiting her skilled nursing facility as they used to. After reviewing the pt's chart, I observed that the Lexapro was recently changed from 10mg  outpatient to 20mg  inpatient within the past 24h. She is also on 0.25mg  po xanax bid for anxiety which is an appropriate dose for this patient and is working well.   After discussion with Dr. Juanetta GoslingHawkins, we both concur that the best course of action is to continue with the new dose of Lexapro 20mg  as the pt has had no adverse reactions to 10mg  and may benefit from a slight increase in dosage before modifying other medications as polypharmacy was already a concern. Dr. Juanetta GoslingHawkins in agreement with plan and pt does not need a TTS nor psychiatry consult at this time.   Yolanda PereyraJustina A Erian Rosengren, NP 03/19/2017 6:04 PM

## 2017-03-19 NOTE — Progress Notes (Signed)
    Subjective: Diarrhea improved. Nausea this morning but no vomiting. Did not want to eat breakfast.   Objective: Vital signs in last 24 hours: Temp:  [97.8 F (36.6 C)-98.5 F (36.9 C)] 97.8 F (36.6 C) (06/25 2155) Pulse Rate:  [94-96] 94 (06/25 2155) Resp:  [18] 18 (06/25 2155) BP: (115-126)/(52-59) 126/59 (06/25 2155) SpO2:  [97 %-98 %] 98 % (06/25 2155) Last BM Date: 03/18/17 General:   Alert and oriented, flat affect  Head:  Normocephalic and atraumatic. Eyes:  No icterus, sclera clear. Conjuctiva pink.  Abdomen:  Bowel sounds present, soft, non-tender, non-distended.  Neurologic:  Alert and  oriented x4 Psych:  Alert and cooperative. Normal mood and affect.  Intake/Output from previous day: 06/25 0701 - 06/26 0700 In: 2700 [P.O.:240; I.V.:2460] Out: -  Intake/Output this shift: No intake/output data recorded.  Lab Results:  Recent Labs  03/18/17 0904  WBC 4.7  HGB 8.1*  HCT 24.1*  PLT 253   BMET  Recent Labs  03/18/17 0904  NA 137  K 3.8  CL 113*  CO2 15*  GLUCOSE 94  BUN 21*  CREATININE 1.15*  CALCIUM 8.3*    Assessment: 76 year old female with recurrent diarrhea and empirically treated now for CDI, as she was exposed to antbiotics twice in past 6 months for cellulitis. Stool testing with positive antigen, negative toxin, and PCR negative. Clinically improved with decreasing diarrhea. Will follow peripherally from a GI standpoint and ensure she has close follow-up with Dr. Karilyn Cotaehman.   Plan: Continue scheduled Zofran Vancomycin 250 mg QID for total of 2 weeks followed by 250 mg BID per Dr. Karilyn Cotaehman, with follow-up as outpatient in 2 weeks with Dr. Karilyn Cotaehman for further dosing/tapering. Will follow peripherally  Gelene MinkAnna W. Boone, PhD, ANP-BC [facility]     LOS: 7 days    03/19/2017, 8:11 AM

## 2017-03-19 NOTE — Clinical Social Work Note (Signed)
LCSW [name] [phone] regarding Yolanda [name]'s tentative discharge for 03/20/17.     Yolanda [name], Yolanda ChinaHeather D, LCSW

## 2017-03-19 NOTE — Telephone Encounter (Signed)
Please make sure patient has a close follow-up with Dr. Karilyn Cotaehman in 2 weeks. Was seen while inpatient.

## 2017-03-19 NOTE — Progress Notes (Signed)
Nurse Fredonia Highlandamika, RN requested a psychiatric consult for the pt.   Per Julieanne CottonAC Tina, RN, the NP needs to see the pt. Debbora PrestoJustina, NP notified of the consult order.   Inetta Fermoina.O, NP notified of consult.   Baldo DaubJolan Demetre Monaco MSW, LCSWA CSW Disposition [phone]

## 2017-03-19 NOTE — Progress Notes (Signed)
03/19/17 2000  PT Visit Information  Last PT Received On 03/19/17  Assistance Needed +1  History of Present Illness Yolanda [name] is a 76yo white female who comes to [facility] on 6/19 from [facility] after worsening diarrhea, intermittent vomitting, and weakness: pt found to be hyponatremic upon arrival. The patient has been at [facility] for 5 weeks, 2 hospital admissions prior to that, with a 20 day stay at [facility] between. Previously, pt was at home, independent in ADL, IADL, and accessing the community. History of falls prior to that. Pt was held 1DA fro PT eval d/t Na+: 128, which is now 131.   Subjective Data  Subjective describes significant pain in legs  Patient Stated Goal Regain strength and independence to return home  Precautions  Precautions Fall  Precaution Comments use care with moving LE's  Restrictions  Weight Bearing Restrictions No  Pain Assessment  Pain Assessment Faces  Faces Pain Scale 6  Pain Location lower legs  Pain Descriptors / Indicators Aching;Sore  Pain Intervention(s) Limited activity within patient's tolerance;Monitored during session;Repositioned  Cognition  Arousal/Alertness Awake/alert  Behavior During Therapy Flat affect  Overall Cognitive Status Impaired/Different from baseline  Area of Impairment Following commands;Safety/judgement;Awareness;Problem solving  Orientation Level Situation  Following Commands Follows one step commands with increased time  Safety/Judgement Decreased awareness of deficits;Decreased awareness of safety  Awareness Intellectual  Problem Solving Slow processing;Decreased initiation  General Comments cued to elicit her mobility sequence  Bed Mobility  Overal bed mobility Needs Assistance  Bed Mobility Supine to Sit  Rolling Min assist  Supine to sit Min assist  Transfers  Overall transfer level Needs assistance  Equipment used Rolling walker (2 wheeled)  Transfers Sit to/from Stand  Sit to Stand Min assist;From elevated  surface  General transfer comment cued sequence  Ambulation/Gait  Ambulation/Gait assistance Min guard;Min assist  Ambulation Distance (Feet) 7 Feet  Assistive device Rolling walker (2 wheeled)  Gait Pattern/deviations Decreased stride length;Wide base of support;Trunk flexed;Shuffle  General Gait Details edema in legs looks limiting and has poor ankle mobility  Gait velocity reduced  Gait velocity interpretation Below normal speed for age/gender  Exercises  Exercises General Lower Extremity  General Exercises - Lower Extremity  Ankle Circles/Pumps AROM;Both;5 reps (holding stretches)  Quad Sets AROM;Both;10 reps  Hip ABduction/ADduction AAROM;Both;10 reps  PT - End of Session  Equipment Utilized During Treatment Gait belt  Activity Tolerance Patient tolerated treatment well;Patient limited by fatigue;Patient limited by pain  Patient left in chair;with call bell/phone within reach;with nursing/sitter in room;with family/visitor present  Nurse Communication Mobility status  PT - Assessment/Plan  PT Plan Current plan remains appropriate  PT Visit Diagnosis Unsteadiness on feet (R26.81);History of falling (Z91.81);Difficulty in walking, not elsewhere classified (R26.2)  PT Frequency (ACUTE ONLY) Min 2X/week  Follow Up Recommendations SNF  PT equipment None recommended by PT  AM-PAC PT "6 Clicks" Daily Activity Outcome Measure  Difficulty turning over in bed (including adjusting bedclothes, sheets and blankets)? 3  Difficulty moving from lying on back to sitting on the side of the bed?  1  Difficulty sitting down on and standing up from a chair with arms (e.g., wheelchair, bedside commode, etc,.)? 1  Help needed moving to and from a bed to chair (including a wheelchair)? 3  Help needed walking in hospital room? 3  Help needed climbing 3-5 steps with a railing?  1  6 Click Score 12  Mobility G Code  CL  PT Goal Progression  Progress towards PT  goals Progressing toward goals  PT Time  Calculation  PT Start Time (ACUTE ONLY) 1446  PT Stop Time (ACUTE ONLY) 1510  PT Time Calculation (min) (ACUTE ONLY) 24 min  PT G-Codes **NOT FOR INPATIENT CLASS**  Functional Assessment Tool Used AM-PAC 6 Clicks Basic Mobility  PT General Charges  $$ ACUTE PT VISIT 1 Procedure  PT Treatments  $Gait Training 8-22 mins  $Therapeutic Exercise 8-22 mins  8:35 PM, 03/19/17 Samul Dadauth Dandre Sisler, PT, MS Physical Therapist - [place] [phone](ASCOM)  [phone] (Office)

## 2017-03-19 NOTE — Progress Notes (Signed)
Subjective: She has less nausea today. She's not vomited today. She is on scheduled Zofran. She will have prolonged treatment with vancomycin. She is still very weak. She says her depression is worse.. No shortness of breath. No chest pain.  Objective: Vital signs in last 24 hours: Temp:  [97.8 F (36.6 C)-98.5 F (36.9 C)] 97.8 F (36.6 C) (06/25 2155) Pulse Rate:  [94-96] 94 (06/25 2155) Resp:  [18] 18 (06/25 2155) BP: (115-126)/(52-59) 126/59 (06/25 2155) SpO2:  [94 %-98 %] 94 % (06/26 0835) Weight change:  Last BM Date: 03/18/17  Intake/Output from previous day: 06/25 0701 - 06/26 0700 In: 2700 [P.O.:240; I.V.:2460] Out: -   PHYSICAL EXAM General appearance: alert, cooperative and mild distress Resp: clear to auscultation bilaterally Cardio: regular rate and rhythm, S1, S2 normal, no murmur, click, rub or gallop GI: soft, non-tender; bowel sounds normal; no masses,  no organomegaly Extremities: extremities normal, atraumatic, no cyanosis or edema Skin warm and dry  Lab Results:  Results for orders placed or performed during the hospital encounter of 03/12/17 (from the past 48 hour(s))  Glucose, capillary     Status: None   Collection Time: 03/17/17 11:17 AM  Result Value Ref Range   Glucose-Capillary 78 65 - 99 mg/dL  Glucose, capillary     Status: None   Collection Time: 03/17/17  4:12 PM  Result Value Ref Range   Glucose-Capillary 83 65 - 99 mg/dL  Glucose, capillary     Status: Abnormal   Collection Time: 03/17/17  9:58 PM  Result Value Ref Range   Glucose-Capillary 110 (H) 65 - 99 mg/dL   Comment 1 Notify RN    Comment 2 Document in Chart   Glucose, capillary     Status: None   Collection Time: 03/18/17  7:47 AM  Result Value Ref Range   Glucose-Capillary 83 65 - 99 mg/dL   Comment 1 Notify RN    Comment 2 Document in Chart   Basic metabolic panel     Status: Abnormal   Collection Time: 03/18/17  9:04 AM  Result Value Ref Range   Sodium 137 135 - 145  mmol/L   Potassium 3.8 3.5 - 5.1 mmol/L   Chloride 113 (H) 101 - 111 mmol/L   CO2 15 (L) 22 - 32 mmol/L   Glucose, Bld 94 65 - 99 mg/dL   BUN 21 (H) 6 - 20 mg/dL   Creatinine, Ser 1.15 (H) 0.44 - 1.00 mg/dL   Calcium 8.3 (L) 8.9 - 10.3 mg/dL   GFR calc non Af Amer 46 (L) >60 mL/min   GFR calc Af Amer 53 (L) >60 mL/min    Comment: (NOTE) The eGFR has been calculated using the CKD EPI equation. This calculation has not been validated in all clinical situations. eGFR's persistently <60 mL/min signify possible Chronic Kidney Disease.    Anion gap 9 5 - 15  CBC with Differential/Platelet     Status: Abnormal   Collection Time: 03/18/17  9:04 AM  Result Value Ref Range   WBC 4.7 4.0 - 10.5 K/uL   RBC 2.48 (L) 3.87 - 5.11 MIL/uL   Hemoglobin 8.1 (L) 12.0 - 15.0 g/dL   HCT 24.1 (L) 36.0 - 46.0 %   MCV 97.2 78.0 - 100.0 fL   MCH 32.7 26.0 - 34.0 pg   MCHC 33.6 30.0 - 36.0 g/dL   RDW 16.3 (H) 11.5 - 15.5 %   Platelets 253 150 - 400 K/uL   Neutrophils Relative %  57 %   Neutro Abs 2.7 1.7 - 7.7 K/uL   Lymphocytes Relative 29 %   Lymphs Abs 1.4 0.7 - 4.0 K/uL   Monocytes Relative 9 %   Monocytes Absolute 0.4 0.1 - 1.0 K/uL   Eosinophils Relative 5 %   Eosinophils Absolute 0.2 0.0 - 0.7 K/uL   Basophils Relative 0 %   Basophils Absolute 0.0 0.0 - 0.1 K/uL  Glucose, capillary     Status: None   Collection Time: 03/18/17 12:04 PM  Result Value Ref Range   Glucose-Capillary 89 65 - 99 mg/dL   Comment 1 Notify RN    Comment 2 Document in Chart   Glucose, capillary     Status: None   Collection Time: 03/18/17  4:50 PM  Result Value Ref Range   Glucose-Capillary 84 65 - 99 mg/dL   Comment 1 Notify RN    Comment 2 Document in Chart   Glucose, capillary     Status: None   Collection Time: 03/18/17  8:32 PM  Result Value Ref Range   Glucose-Capillary 92 65 - 99 mg/dL   Comment 1 Notify RN    Comment 2 Document in Chart   Glucose, capillary     Status: None   Collection Time:  03/19/17  7:44 AM  Result Value Ref Range   Glucose-Capillary 97 65 - 99 mg/dL   Comment 1 Notify RN    Comment 2 Document in Chart     ABGS No results for input(s): PHART, PO2ART, TCO2, HCO3 in the last 72 hours.  Invalid input(s): PCO2 CULTURES Recent Results (from the past 240 hour(s))  C difficile quick scan w PCR reflex     Status: Abnormal   Collection Time: 03/12/17  2:01 PM  Result Value Ref Range Status   C Diff antigen POSITIVE (A) NEGATIVE Final   C Diff toxin NEGATIVE NEGATIVE Final   C Diff interpretation Results are indeterminate. See PCR results.  Final  Gastrointestinal Panel by PCR , Stool     Status: None   Collection Time: 03/12/17  2:01 PM  Result Value Ref Range Status   Campylobacter species NOT DETECTED NOT DETECTED Final   Plesimonas shigelloides NOT DETECTED NOT DETECTED Final   Salmonella species NOT DETECTED NOT DETECTED Final   Yersinia enterocolitica NOT DETECTED NOT DETECTED Final   Vibrio species NOT DETECTED NOT DETECTED Final   Vibrio cholerae NOT DETECTED NOT DETECTED Final   Enteroaggregative E coli (EAEC) NOT DETECTED NOT DETECTED Final   Enteropathogenic E coli (EPEC) NOT DETECTED NOT DETECTED Final   Enterotoxigenic E coli (ETEC) NOT DETECTED NOT DETECTED Final   Shiga like toxin producing E coli (STEC) NOT DETECTED NOT DETECTED Final   Shigella/Enteroinvasive E coli (EIEC) NOT DETECTED NOT DETECTED Final   Cryptosporidium NOT DETECTED NOT DETECTED Final   Cyclospora cayetanensis NOT DETECTED NOT DETECTED Final   Entamoeba histolytica NOT DETECTED NOT DETECTED Final   Giardia lamblia NOT DETECTED NOT DETECTED Final   Adenovirus F40/41 NOT DETECTED NOT DETECTED Final   Astrovirus NOT DETECTED NOT DETECTED Final   Norovirus GI/GII NOT DETECTED NOT DETECTED Final   Rotavirus A NOT DETECTED NOT DETECTED Final   Sapovirus (I, II, IV, and V) NOT DETECTED NOT DETECTED Final  Clostridium Difficile by PCR     Status: None   Collection Time:  03/12/17  2:01 PM  Result Value Ref Range Status   Toxigenic C Difficile by pcr NEGATIVE NEGATIVE Final    Comment:  Patient is colonized with non toxigenic C. difficile. May not need treatment unless significant symptoms are present. Performed at [facility], [place] [address], [place], [place] [address]   MRSA PCR Screening     Status: None   Collection Time: 03/12/17  4:11 PM  Result Value Ref Range Status   MRSA by PCR NEGATIVE NEGATIVE Final    Comment:        The GeneXpert MRSA Assay (FDA approved for NASAL specimens only), is one component of a comprehensive MRSA colonization surveillance program. It is not intended to diagnose MRSA infection nor to guide or monitor treatment for MRSA infections.   Urine Culture     Status: Abnormal   Collection Time: 03/14/17  6:01 PM  Result Value Ref Range Status   Specimen Description URINE, CLEAN CATCH  Final   Special Requests NONE  Final   Culture (A)  Final    >=100,000 COLONIES/mL KLEBSIELLA PNEUMONIAE 50,000 COLONIES/mL CITROBACTER AMALONATICUS    Report Status 03/18/2017 FINAL  Final   Organism ID, Bacteria KLEBSIELLA PNEUMONIAE (A)  Final   Organism ID, Bacteria CITROBACTER AMALONATICUS (A)  Final      Susceptibility   Citrobacter amalonaticus - MIC*    CEFAZOLIN >=64 RESISTANT Resistant     CEFTRIAXONE >=64 RESISTANT Resistant     CIPROFLOXACIN <=0.25 SENSITIVE Sensitive     GENTAMICIN <=1 SENSITIVE Sensitive     IMIPENEM <=0.25 SENSITIVE Sensitive     NITROFURANTOIN 32 SENSITIVE Sensitive     TRIMETH/SULFA <=20 SENSITIVE Sensitive     PIP/TAZO <=4 SENSITIVE Sensitive     * 50,000 COLONIES/mL CITROBACTER AMALONATICUS   Klebsiella pneumoniae - MIC*    AMPICILLIN RESISTANT Resistant     CEFAZOLIN <=4 SENSITIVE Sensitive     CEFTRIAXONE <=1 SENSITIVE Sensitive     CIPROFLOXACIN <=0.25 SENSITIVE Sensitive     GENTAMICIN <=1 SENSITIVE Sensitive     IMIPENEM <=0.25 SENSITIVE Sensitive     NITROFURANTOIN 32 SENSITIVE  Sensitive     TRIMETH/SULFA <=20 SENSITIVE Sensitive     AMPICILLIN/SULBACTAM <=2 SENSITIVE Sensitive     PIP/TAZO <=4 SENSITIVE Sensitive     Extended ESBL NEGATIVE Sensitive     * >=100,000 COLONIES/mL KLEBSIELLA PNEUMONIAE   Studies/Results: No results found.  Medications:  Prior to Admission:  Prescriptions Prior to Admission  Medication Sig Dispense Refill Last Dose  . acetaminophen (TYLENOL) 325 MG tablet Take 2 tablets (650 mg total) by mouth every 6 (six) hours as needed for mild pain (or Fever >/= 101).   03/11/2017 at Unknown time  . albuterol (PROVENTIL HFA;VENTOLIN HFA) 108 (90 Base) MCG/ACT inhaler Inhale 2 puffs into the lungs 4 (four) times daily.    unknown  . allopurinol (ZYLOPRIM) 300 MG tablet Take 0.5 tablets (150 mg total) by mouth daily.   03/12/2017 at Unknown time  . ALPRAZolam (XANAX) 0.25 MG tablet Take 1 tablet (0.25 mg total) by mouth 2 (two) times daily as needed for anxiety. 60 tablet 0 unknown  . apixaban (ELIQUIS) 5 MG TABS tablet Take 1 tablet (5 mg total) by mouth 2 (two) times daily. 60 tablet 5 03/12/2017 at 1000  . Balsam [place]-Castor Oil (VENELEX) OINT Apply 1 application topically See admin instructions. Apply to sacrum and bilateral buttocks every shift and as needed.   03/11/2017 at Unknown time  . calcium carbonate (TUMS - DOSED IN MG ELEMENTAL CALCIUM) 500 MG chewable tablet Chew 1 tablet by mouth 2 (two) times daily.   03/12/2017 at  Unknown time  . cetirizine (ZYRTEC) 10 MG tablet Take 10 mg by mouth daily.   03/12/2017 at Unknown time  . cholecalciferol (VITAMIN D) 1000 units tablet Take 1,000 Units by mouth every morning.   03/12/2017 at Unknown time  . colchicine 0.6 MG tablet Take 1 tablet (0.6 mg total) by mouth 2 (two) times daily.   03/12/2017 at Unknown time  . diltiazem (CARDIZEM CD) 120 MG 24 hr capsule Take 1 capsule (120 mg total) by mouth daily. 90 capsule 3 03/12/2017 at Unknown time  . escitalopram (LEXAPRO) 10 MG tablet Take 10 mg by mouth  daily.   03/12/2017 at Unknown time  . feeding supplement, GLUCERNA SHAKE, (GLUCERNA SHAKE) LIQD Take 237 mLs by mouth 3 (three) times daily between meals.  0 03/12/2017 at Unknown time  . fluticasone furoate-vilanterol (BREO ELLIPTA) 200-25 MCG/INH AEPB Inhale 1 puff into the lungs daily.   03/12/2017 at Unknown time  . gabapentin (NEURONTIN) 100 MG capsule Take 100 mg by mouth 2 (two) times daily.   03/12/2017 at Unknown time  . HYDROcodone-acetaminophen (NORCO) 7.5-325 MG tablet Take 1 tablet by mouth 3 (three) times daily as needed for moderate pain.   Past Week at Unknown time  . insulin glargine (LANTUS) 100 UNIT/ML injection Inject 0.2 mLs (20 Units total) into the skin daily. 10 mL 11 03/11/2017 at Unknown time  . insulin lispro (HUMALOG) 100 UNIT/ML injection Inject 3-20 Units into the skin 3 (three) times daily before meals. 121-150=1 units 151-200=2 units 201-250=3 units 251-300=5units 301-350=7units 351-400=9units   03/12/2017 at Unknown time  . levothyroxine (SYNTHROID, LEVOTHROID) 137 MCG tablet Take 137 mcg by mouth daily before breakfast.   03/12/2017 at Unknown time  . loperamide (IMODIUM A-D) 2 MG tablet Take 2 mg by mouth 4 (four) times daily as needed for diarrhea or loose stools.     . metoCLOPramide (REGLAN) 10 MG tablet Take 0.5 tablets (5 mg total) by mouth every 6 (six) hours as needed for nausea (nausea). 6 tablet 0 03/12/2017 at Unknown time  . metoprolol tartrate (LOPRESSOR) 25 MG tablet TAKE 1/2 TABLET BY MOUTH TWICE DAILY 90 tablet 1 03/12/2017 at 1000  . Multiple Vitamin (MULTIVITAMIN WITH MINERALS) TABS tablet Take 1 tablet by mouth daily.   03/12/2017 at Unknown time  . omeprazole (PRILOSEC) 20 MG capsule TAKE ONE CAPSULE BY MOUTH DAILY 90 capsule 3 03/11/2017 at Unknown time  . ondansetron (ZOFRAN) 4 MG tablet Take 1 tablet (4 mg total) by mouth every 6 (six) hours as needed for nausea. 20 tablet 0 03/12/2017 at Unknown time  . polyvinyl alcohol (LIQUIFILM TEARS) 1.4 %  ophthalmic solution Place 1 drop into both eyes 4 (four) times daily as needed for dry eyes. 15 mL 0 03/11/2017 at Unknown time  . promethazine (PHENERGAN) 12.5 MG tablet Take 12.5 mg by mouth every 6 (six) hours as needed for nausea or vomiting.   03/12/2017 at Unknown time  . triamcinolone (NASACORT ALLERGY 24HR) 55 MCG/ACT AERO nasal inhaler Place 2 sprays into the nose daily.     . Doxepin HCl (SILENOR) 6 MG TABS Take 1 tablet (6 mg total) by mouth at bedtime as needed. 30 tablet  Past Week at Unknown time  . potassium chloride SA (K-DUR,KLOR-CON) 20 MEQ tablet Take 1 tablet (20 mEq total) by mouth daily. (Patient not taking: Reported on 03/12/2017)   Not Taking at Unknown time  . torsemide (DEMADEX) 20 MG tablet Take 1 tablet (20 mg total) by mouth 2 (two)  times daily. (may take an extra 40m as needed) (Patient not taking: Reported on 03/12/2017)   Not Taking at Unknown time   Scheduled: . allopurinol  150 mg Oral Daily  . escitalopram  10 mg Oral Daily  . feeding supplement (GLUCERNA SHAKE)  237 mL Oral TID BM  . fluticasone furoate-vilanterol  1 puff Inhalation Daily  . gabapentin  100 mg Oral BID  . heparin  5,000 Units Subcutaneous Q8H  . insulin aspart  0-15 Units Subcutaneous TID WC  . insulin aspart  0-5 Units Subcutaneous QHS  . insulin glargine  20 Units Subcutaneous Daily  . levothyroxine  137 mcg Oral QAC breakfast  . metoprolol tartrate  12.5 mg Oral BID  . nystatin-triamcinolone   Topical BID  . ondansetron  4 mg Oral TID AC  . pantoprazole  40 mg Oral Daily  . sodium bicarbonate  650 mg Oral BID  . vancomycin  250 mg Oral Q6H   Continuous: . sodium chloride 50 mL/hr at 03/18/17 1709   PFIE:PPIRJJOACZYSA**OR** acetaminophen, ALPRAZolam, HYDROcodone-acetaminophen, metoCLOPramide, ondansetron **OR** ondansetron (ZOFRAN) IV  Assesment: She was admitted with diarrhea presumably from C. difficile. She's getting better. She had nausea and vomiting yesterday and that seems  better. At baseline she has paroxysmal atrial fibrillation and is chronically anticoagulated. She has congestive heart failure which is doing okay. She has COPD which is doing okay. She has significant hyponatremia on admission and that is better. She was significantly dehydrated on admission with an acute kidney injury and that is back to baseline. However she does have low bicarbonate on her blood testing. She has depression which she feels is worse. She is on fairly low-dose escitalopram Active Problems:   GERD (gastroesophageal reflux disease)   COPD (chronic obstructive pulmonary disease) (HCC)   Hypothyroidism   PAF (paroxysmal atrial fibrillation) (HCC)   Hyponatremia   [place]   Diabetes mellitus type 2 in obese ([facility]   Diarrhea    Plan: I will request a psychiatry consultation. I will increase her antidepressant. She has had significant problems with progressing with her rehabilitation I think because of her depression and if I can get a suggestion about medications or simply increasing her current medication that would be very helpful and may help her get through rehabilitation and get back home. I'm also going to add some sodium bicarbonate because that may help some as well.    LOS: 7 days   , L 03/19/2017, 8:48 AM

## 2017-03-19 NOTE — Progress Notes (Signed)
LCSW received call from [name]  Patient has been approved to return to SNF: [facility]  [facility] Auth:  [id number]:  [id number] Next review date 03/21/17 Therapy Hours:  [address]:  [facility] Fax clinicals:  [phone]  With Pierce CraneMelanie [name] has contact for patient.  [facility] when medically [facility].  Yolanda EmoryHannah Sargent Mankey LCSW, MSW Clinical Social Work: Optician, dispensingystem [employer] Coverage for :  [phone]

## 2017-03-20 ENCOUNTER — Inpatient Hospital Stay
Admission: RE | Admit: 2017-03-20 | Discharge: 2017-04-05 | Disposition: A | Discharge status: Other Acute Inpatient Hospital | Payer: Medicare Other | Source: Ambulatory Visit | Attending: Pulmonary Disease | Admitting: Pulmonary Disease

## 2017-03-20 LAB — CBC WITH DIFFERENTIAL/PLATELET
BASOS ABS: 0 10*3/uL (ref 0.0–0.1)
BASOS PCT: 0 %
EOS ABS: 0.3 10*3/uL (ref 0.0–0.7)
Eosinophils Relative: 6 %
HCT: 23.3 % — ABNORMAL LOW (ref 36.0–46.0)
HEMOGLOBIN: 7.6 g/dL — AB (ref 12.0–15.0)
LYMPHS ABS: 1.6 10*3/uL (ref 0.7–4.0)
Lymphocytes Relative: 33 %
MCH: 31.8 pg (ref 26.0–34.0)
MCHC: 32.6 g/dL (ref 30.0–36.0)
MCV: 97.5 fL (ref 78.0–100.0)
Monocytes Absolute: 0.5 10*3/uL (ref 0.1–1.0)
Monocytes Relative: 11 %
NEUTROS PCT: 50 %
Neutro Abs: 2.4 10*3/uL (ref 1.7–7.7)
Platelets: 259 10*3/uL (ref 150–400)
RBC: 2.39 MIL/uL — AB (ref 3.87–5.11)
RDW: 16.3 % — ABNORMAL HIGH (ref 11.5–15.5)
WBC: 4.8 10*3/uL (ref 4.0–10.5)

## 2017-03-20 LAB — BASIC METABOLIC PANEL
ANION GAP: 8 (ref 5–15)
BUN: 14 mg/dL (ref 6–20)
CO2: 16 mmol/L — ABNORMAL LOW (ref 22–32)
Calcium: 8.3 mg/dL — ABNORMAL LOW (ref 8.9–10.3)
Chloride: 115 mmol/L — ABNORMAL HIGH (ref 101–111)
Creatinine, Ser: 1.1 mg/dL — ABNORMAL HIGH (ref 0.44–1.00)
GFR calc non Af Amer: 48 mL/min — ABNORMAL LOW (ref >60)
GFR, EST AFRICAN AMERICAN: 56 mL/min — AB (ref >60)
Glucose, Bld: 83 mg/dL (ref 65–99)
Potassium: 3.4 mmol/L — ABNORMAL LOW (ref 3.5–5.1)
SODIUM: 139 mmol/L (ref 135–145)

## 2017-03-20 LAB — GLUCOSE, CAPILLARY
GLUCOSE-CAPILLARY: 87 mg/dL (ref 65–99)
Glucose-Capillary: 75 mg/dL (ref 65–99)

## 2017-03-20 MED ORDER — ONDANSETRON 4 MG PO TBDP: 4.0000 mg | ORAL_TABLET | Freq: Three times a day (TID) | ORAL | 0 refills | Status: DC

## 2017-03-20 MED ORDER — NYSTATIN-TRIAMCINOLONE 100000-0.1 UNIT/GM-% EX CREA: TOPICAL_CREAM | Freq: Two times a day (BID) | CUTANEOUS | 0 refills | Status: DC

## 2017-03-20 MED ORDER — ESCITALOPRAM OXALATE 20 MG PO TABS
20.0000 mg | ORAL_TABLET | Freq: Every day | ORAL | 12 refills | Status: DC
Start: 2017-03-20 — End: 2017-05-14

## 2017-03-20 MED ORDER — VANCOMYCIN 50 MG/ML ORAL SOLUTION: 250.0000 mg | Freq: Four times a day (QID) | ORAL | Status: DC

## 2017-03-20 MED ORDER — COLCHICINE 0.6 MG PO TABS
0.6000 mg | ORAL_TABLET | Freq: Every day | ORAL | Status: DC
Start: 2017-03-20 — End: 2017-04-19

## 2017-03-20 MED ORDER — SODIUM BICARBONATE 650 MG PO TABS: 650.0000 mg | ORAL_TABLET | Freq: Two times a day (BID) | ORAL | Status: DC

## 2017-03-20 NOTE — Clinical Social Work Note (Cosign Needed)
[facility] and a sent clinicals to facility.   LCSW notified patient's sister, Sallye OberLouise, of patient's discharge.    LCSW signing off.      Anastazja Isaac, Juleen ChinaHeather D, LCSW

## 2017-03-20 NOTE — Care Management Note (Signed)
Case Management Note  Patient Details  Name: Yolanda MurrainBrenda W [name] MRN: [id number] Date of Birth: Mar 09, 1941  Expected Discharge Date:  03/20/17               Expected Discharge Plan:  Skilled Nursing Facility  In-House Referral:  Clinical Social Work  Discharge planning Services  CM Consult  Status of Service:  Completed, signed off  If discussed at [facility] Length of Stay Meetings, dates discussed:  03/19/2017  Additional Comments: DC back to SNF today. CSW has made arrangements for return to facility.   [name], Parthiv Mucci Demske, RN 03/20/2017, 9:56 AM

## 2017-03-20 NOTE — Progress Notes (Signed)
Subjective: She feels better. She has less abdominal pain. No nausea. Her diarrhea is much improved  Objective: Vital signs in last 24 hours: Temp:  [97.5 F (36.4 C)-98.3 F (36.8 C)] 98.3 F (36.8 C) (06/27 0500) Pulse Rate:  [91-102] 91 (06/27 0500) Resp:  [18-20] 20 (06/27 0500) BP: (114-134)/(46-56) 118/46 (06/27 0500) SpO2:  [95 %-99 %] 95 % (06/27 0813) Weight change:  Last BM Date: 03/19/17  Intake/Output from previous day: 06/26 0701 - 06/27 0700 In: 1284.2 [I.V.:1284.2] Out: -   PHYSICAL EXAM General appearance: alert, cooperative and mild distress Resp: clear to auscultation bilaterally Cardio: regular rate and rhythm, S1, S2 normal, no murmur, click, rub or gallop GI: soft, non-tender; bowel sounds normal; no masses,  no organomegaly Extremities: extremities normal, atraumatic, no cyanosis or edema Mucous membranes are moist  Lab Results:  Results for orders placed or performed during the hospital encounter of 03/12/17 (from the past 48 hour(s))  Basic metabolic panel     Status: Abnormal   Collection Time: 03/18/17  9:04 AM  Result Value Ref Range   Sodium 137 135 - 145 mmol/L   Potassium 3.8 3.5 - 5.1 mmol/L   Chloride 113 (H) 101 - 111 mmol/L   CO2 15 (L) 22 - 32 mmol/L   Glucose, Bld 94 65 - 99 mg/dL   BUN 21 (H) 6 - 20 mg/dL   Creatinine, Ser 1.15 (H) 0.44 - 1.00 mg/dL   Calcium 8.3 (L) 8.9 - 10.3 mg/dL   GFR calc non Af Amer 46 (L) >60 mL/min   GFR calc Af Amer 53 (L) >60 mL/min    Comment: (NOTE) The eGFR has been calculated using the CKD EPI equation. This calculation has not been validated in all clinical situations. eGFR's persistently <60 mL/min signify possible Chronic Kidney Disease.    Anion gap 9 5 - 15  CBC with Differential/Platelet     Status: Abnormal   Collection Time: 03/18/17  9:04 AM  Result Value Ref Range   WBC 4.7 4.0 - 10.5 K/uL   RBC 2.48 (L) 3.87 - 5.11 MIL/uL   Hemoglobin 8.1 (L) 12.0 - 15.0 g/dL   HCT 24.1 (L) 36.0 -  46.0 %   MCV 97.2 78.0 - 100.0 fL   MCH 32.7 26.0 - 34.0 pg   MCHC 33.6 30.0 - 36.0 g/dL   RDW 16.3 (H) 11.5 - 15.5 %   Platelets 253 150 - 400 K/uL   Neutrophils Relative % 57 %   Neutro Abs 2.7 1.7 - 7.7 K/uL   Lymphocytes Relative 29 %   Lymphs Abs 1.4 0.7 - 4.0 K/uL   Monocytes Relative 9 %   Monocytes Absolute 0.4 0.1 - 1.0 K/uL   Eosinophils Relative 5 %   Eosinophils Absolute 0.2 0.0 - 0.7 K/uL   Basophils Relative 0 %   Basophils Absolute 0.0 0.0 - 0.1 K/uL  Glucose, capillary     Status: None   Collection Time: 03/18/17 12:04 PM  Result Value Ref Range   Glucose-Capillary 89 65 - 99 mg/dL   Comment 1 Notify RN    Comment 2 Document in Chart   Glucose, capillary     Status: None   Collection Time: 03/18/17  4:50 PM  Result Value Ref Range   Glucose-Capillary 84 65 - 99 mg/dL   Comment 1 Notify RN    Comment 2 Document in Chart   Glucose, capillary     Status: None   Collection Time: 03/18/17  8:32 PM  Result Value Ref Range   Glucose-Capillary 92 65 - 99 mg/dL   Comment 1 Notify RN    Comment 2 Document in Chart   Glucose, capillary     Status: None   Collection Time: 03/19/17  7:44 AM  Result Value Ref Range   Glucose-Capillary 97 65 - 99 mg/dL   Comment 1 Notify RN    Comment 2 Document in Chart   Glucose, capillary     Status: None   Collection Time: 03/19/17 11:56 AM  Result Value Ref Range   Glucose-Capillary 89 65 - 99 mg/dL   Comment 1 Notify RN    Comment 2 Document in Chart   Glucose, capillary     Status: None   Collection Time: 03/19/17  4:43 PM  Result Value Ref Range   Glucose-Capillary 99 65 - 99 mg/dL   Comment 1 Notify RN    Comment 2 Document in Chart   Glucose, capillary     Status: Abnormal   Collection Time: 03/19/17  9:15 PM  Result Value Ref Range   Glucose-Capillary 105 (H) 65 - 99 mg/dL  CBC with Differential/Platelet     Status: Abnormal   Collection Time: 03/20/17  4:58 AM  Result Value Ref Range   WBC 4.8 4.0 - 10.5 K/uL    RBC 2.39 (L) 3.87 - 5.11 MIL/uL   Hemoglobin 7.6 (L) 12.0 - 15.0 g/dL   HCT 23.3 (L) 36.0 - 46.0 %   MCV 97.5 78.0 - 100.0 fL   MCH 31.8 26.0 - 34.0 pg   MCHC 32.6 30.0 - 36.0 g/dL   RDW 16.3 (H) 11.5 - 15.5 %   Platelets 259 150 - 400 K/uL   Neutrophils Relative % 50 %   Neutro Abs 2.4 1.7 - 7.7 K/uL   Lymphocytes Relative 33 %   Lymphs Abs 1.6 0.7 - 4.0 K/uL   Monocytes Relative 11 %   Monocytes Absolute 0.5 0.1 - 1.0 K/uL   Eosinophils Relative 6 %   Eosinophils Absolute 0.3 0.0 - 0.7 K/uL   Basophils Relative 0 %   Basophils Absolute 0.0 0.0 - 0.1 K/uL  Basic metabolic panel     Status: Abnormal   Collection Time: 03/20/17  4:58 AM  Result Value Ref Range   Sodium 139 135 - 145 mmol/L   Potassium 3.4 (L) 3.5 - 5.1 mmol/L   Chloride 115 (H) 101 - 111 mmol/L   CO2 16 (L) 22 - 32 mmol/L   Glucose, Bld 83 65 - 99 mg/dL   BUN 14 6 - 20 mg/dL   Creatinine, Ser 1.10 (H) 0.44 - 1.00 mg/dL   Calcium 8.3 (L) 8.9 - 10.3 mg/dL   GFR calc non Af Amer 48 (L) >60 mL/min   GFR calc Af Amer 56 (L) >60 mL/min    Comment: (NOTE) The eGFR has been calculated using the CKD EPI equation. This calculation has not been validated in all clinical situations. eGFR's persistently <60 mL/min signify possible Chronic Kidney Disease.    Anion gap 8 5 - 15  Glucose, capillary     Status: None   Collection Time: 03/20/17  7:48 AM  Result Value Ref Range   Glucose-Capillary 87 65 - 99 mg/dL   Comment 1 Notify RN    Comment 2 Document in Chart     ABGS No results for input(s): PHART, PO2ART, TCO2, HCO3 in the last 72 hours.  Invalid input(s): PCO2 CULTURES Recent Results (from the  past 240 hour(s))  C difficile quick scan w PCR reflex     Status: Abnormal   Collection Time: 03/12/17  2:01 PM  Result Value Ref Range Status   C Diff antigen POSITIVE (A) NEGATIVE Final   C Diff toxin NEGATIVE NEGATIVE Final   C Diff interpretation Results are indeterminate. See PCR results.  Final   Gastrointestinal Panel by PCR , Stool     Status: None   Collection Time: 03/12/17  2:01 PM  Result Value Ref Range Status   Campylobacter species NOT DETECTED NOT DETECTED Final   Plesimonas shigelloides NOT DETECTED NOT DETECTED Final   Salmonella species NOT DETECTED NOT DETECTED Final   Yersinia enterocolitica NOT DETECTED NOT DETECTED Final   Vibrio species NOT DETECTED NOT DETECTED Final   Vibrio cholerae NOT DETECTED NOT DETECTED Final   Enteroaggregative E coli (EAEC) NOT DETECTED NOT DETECTED Final   Enteropathogenic E coli (EPEC) NOT DETECTED NOT DETECTED Final   Enterotoxigenic E coli (ETEC) NOT DETECTED NOT DETECTED Final   Shiga like toxin producing E coli (STEC) NOT DETECTED NOT DETECTED Final   Shigella/Enteroinvasive E coli (EIEC) NOT DETECTED NOT DETECTED Final   Cryptosporidium NOT DETECTED NOT DETECTED Final   Cyclospora cayetanensis NOT DETECTED NOT DETECTED Final   Entamoeba histolytica NOT DETECTED NOT DETECTED Final   Giardia lamblia NOT DETECTED NOT DETECTED Final   Adenovirus F40/41 NOT DETECTED NOT DETECTED Final   Astrovirus NOT DETECTED NOT DETECTED Final   Norovirus GI/GII NOT DETECTED NOT DETECTED Final   Rotavirus A NOT DETECTED NOT DETECTED Final   Sapovirus (I, II, IV, and V) NOT DETECTED NOT DETECTED Final  Clostridium Difficile by PCR     Status: None   Collection Time: 03/12/17  2:01 PM  Result Value Ref Range Status   Toxigenic C Difficile by pcr NEGATIVE NEGATIVE Final    Comment: Patient is colonized with non toxigenic C. difficile. May not need treatment unless significant symptoms are present. Performed at [facility], [place] [address], [place], [place] [address]   MRSA PCR Screening     Status: None   Collection Time: 03/12/17  4:11 PM  Result Value Ref Range Status   MRSA by PCR NEGATIVE NEGATIVE Final    Comment:        The GeneXpert MRSA Assay (FDA approved for NASAL specimens only), is one component of a comprehensive MRSA  colonization surveillance program. It is not intended to diagnose MRSA infection nor to guide or monitor treatment for MRSA infections.   Urine Culture     Status: Abnormal   Collection Time: 03/14/17  6:01 PM  Result Value Ref Range Status   Specimen Description URINE, CLEAN CATCH  Final   Special Requests NONE  Final   Culture (A)  Final    >=100,000 COLONIES/mL KLEBSIELLA PNEUMONIAE 50,000 COLONIES/mL CITROBACTER AMALONATICUS    Report Status 03/18/2017 FINAL  Final   Organism ID, Bacteria KLEBSIELLA PNEUMONIAE (A)  Final   Organism ID, Bacteria CITROBACTER AMALONATICUS (A)  Final      Susceptibility   Citrobacter amalonaticus - MIC*    CEFAZOLIN >=64 RESISTANT Resistant     CEFTRIAXONE >=64 RESISTANT Resistant     CIPROFLOXACIN <=0.25 SENSITIVE Sensitive     GENTAMICIN <=1 SENSITIVE Sensitive     IMIPENEM <=0.25 SENSITIVE Sensitive     NITROFURANTOIN 32 SENSITIVE Sensitive     TRIMETH/SULFA <=20 SENSITIVE Sensitive     PIP/TAZO <=4 SENSITIVE Sensitive     *  50,000 COLONIES/mL CITROBACTER AMALONATICUS   Klebsiella pneumoniae - MIC*    AMPICILLIN RESISTANT Resistant     CEFAZOLIN <=4 SENSITIVE Sensitive     CEFTRIAXONE <=1 SENSITIVE Sensitive     CIPROFLOXACIN <=0.25 SENSITIVE Sensitive     GENTAMICIN <=1 SENSITIVE Sensitive     IMIPENEM <=0.25 SENSITIVE Sensitive     NITROFURANTOIN 32 SENSITIVE Sensitive     TRIMETH/SULFA <=20 SENSITIVE Sensitive     AMPICILLIN/SULBACTAM <=2 SENSITIVE Sensitive     PIP/TAZO <=4 SENSITIVE Sensitive     Extended ESBL NEGATIVE Sensitive     * >=100,000 COLONIES/mL KLEBSIELLA PNEUMONIAE   Studies/Results: No results found.  Medications:  Prior to Admission:  Prescriptions Prior to Admission  Medication Sig Dispense Refill Last Dose  . acetaminophen (TYLENOL) 325 MG tablet Take 2 tablets (650 mg total) by mouth every 6 (six) hours as needed for mild pain (or Fever >/= 101).   03/11/2017 at Unknown time  . albuterol (PROVENTIL  HFA;VENTOLIN HFA) 108 (90 Base) MCG/ACT inhaler Inhale 2 puffs into the lungs 4 (four) times daily.    unknown  . allopurinol (ZYLOPRIM) 300 MG tablet Take 0.5 tablets (150 mg total) by mouth daily.   03/12/2017 at Unknown time  . ALPRAZolam (XANAX) 0.25 MG tablet Take 1 tablet (0.25 mg total) by mouth 2 (two) times daily as needed for anxiety. 60 tablet 0 unknown  . apixaban (ELIQUIS) 5 MG TABS tablet Take 1 tablet (5 mg total) by mouth 2 (two) times daily. 60 tablet 5 03/12/2017 at 1000  . Balsam [place]-Castor Oil (VENELEX) OINT Apply 1 application topically See admin instructions. Apply to sacrum and bilateral buttocks every shift and as needed.   03/11/2017 at Unknown time  . calcium carbonate (TUMS - DOSED IN MG ELEMENTAL CALCIUM) 500 MG chewable tablet Chew 1 tablet by mouth 2 (two) times daily.   03/12/2017 at Unknown time  . cetirizine (ZYRTEC) 10 MG tablet Take 10 mg by mouth daily.   03/12/2017 at Unknown time  . cholecalciferol (VITAMIN D) 1000 units tablet Take 1,000 Units by mouth every morning.   03/12/2017 at Unknown time  . colchicine 0.6 MG tablet Take 1 tablet (0.6 mg total) by mouth 2 (two) times daily.   03/12/2017 at Unknown time  . diltiazem (CARDIZEM CD) 120 MG 24 hr capsule Take 1 capsule (120 mg total) by mouth daily. 90 capsule 3 03/12/2017 at Unknown time  . escitalopram (LEXAPRO) 10 MG tablet Take 10 mg by mouth daily.   03/12/2017 at Unknown time  . feeding supplement, GLUCERNA SHAKE, (GLUCERNA SHAKE) LIQD Take 237 mLs by mouth 3 (three) times daily between meals.  0 03/12/2017 at Unknown time  . fluticasone furoate-vilanterol (BREO ELLIPTA) 200-25 MCG/INH AEPB Inhale 1 puff into the lungs daily.   03/12/2017 at Unknown time  . gabapentin (NEURONTIN) 100 MG capsule Take 100 mg by mouth 2 (two) times daily.   03/12/2017 at Unknown time  . HYDROcodone-acetaminophen (NORCO) 7.5-325 MG tablet Take 1 tablet by mouth 3 (three) times daily as needed for moderate pain.   Past Week at Unknown  time  . insulin glargine (LANTUS) 100 UNIT/ML injection Inject 0.2 mLs (20 Units total) into the skin daily. 10 mL 11 03/11/2017 at Unknown time  . insulin lispro (HUMALOG) 100 UNIT/ML injection Inject 3-20 Units into the skin 3 (three) times daily before meals. 121-150=1 units 151-200=2 units 201-250=3 units 251-300=5units 301-350=7units 351-400=9units   03/12/2017 at Unknown time  . levothyroxine (SYNTHROID, LEVOTHROID) 137 MCG  tablet Take 137 mcg by mouth daily before breakfast.   03/12/2017 at Unknown time  . loperamide (IMODIUM A-D) 2 MG tablet Take 2 mg by mouth 4 (four) times daily as needed for diarrhea or loose stools.     . metoCLOPramide (REGLAN) 10 MG tablet Take 0.5 tablets (5 mg total) by mouth every 6 (six) hours as needed for nausea (nausea). 6 tablet 0 03/12/2017 at Unknown time  . metoprolol tartrate (LOPRESSOR) 25 MG tablet TAKE 1/2 TABLET BY MOUTH TWICE DAILY 90 tablet 1 03/12/2017 at 1000  . Multiple Vitamin (MULTIVITAMIN WITH MINERALS) TABS tablet Take 1 tablet by mouth daily.   03/12/2017 at Unknown time  . omeprazole (PRILOSEC) 20 MG capsule TAKE ONE CAPSULE BY MOUTH DAILY 90 capsule 3 03/11/2017 at Unknown time  . ondansetron (ZOFRAN) 4 MG tablet Take 1 tablet (4 mg total) by mouth every 6 (six) hours as needed for nausea. 20 tablet 0 03/12/2017 at Unknown time  . polyvinyl alcohol (LIQUIFILM TEARS) 1.4 % ophthalmic solution Place 1 drop into both eyes 4 (four) times daily as needed for dry eyes. 15 mL 0 03/11/2017 at Unknown time  . promethazine (PHENERGAN) 12.5 MG tablet Take 12.5 mg by mouth every 6 (six) hours as needed for nausea or vomiting.   03/12/2017 at Unknown time  . triamcinolone (NASACORT ALLERGY 24HR) 55 MCG/ACT AERO nasal inhaler Place 2 sprays into the nose daily.     . Doxepin HCl (SILENOR) 6 MG TABS Take 1 tablet (6 mg total) by mouth at bedtime as needed. 30 tablet  Past Week at Unknown time  . potassium chloride SA (K-DUR,KLOR-CON) 20 MEQ tablet Take 1 tablet  (20 mEq total) by mouth daily. (Patient not taking: Reported on 03/12/2017)   Not Taking at Unknown time  . torsemide (DEMADEX) 20 MG tablet Take 1 tablet (20 mg total) by mouth 2 (two) times daily. (may take an extra 62m as needed) (Patient not taking: Reported on 03/12/2017)   Not Taking at Unknown time   Scheduled: . allopurinol  150 mg Oral Daily  . escitalopram  20 mg Oral Daily  . feeding supplement (GLUCERNA SHAKE)  237 mL Oral TID BM  . fluticasone furoate-vilanterol  1 puff Inhalation Daily  . gabapentin  100 mg Oral BID  . heparin  5,000 Units Subcutaneous Q8H  . insulin aspart  0-15 Units Subcutaneous TID WC  . insulin aspart  0-5 Units Subcutaneous QHS  . insulin glargine  20 Units Subcutaneous Daily  . levothyroxine  137 mcg Oral QAC breakfast  . metoprolol tartrate  12.5 mg Oral BID  . nystatin-triamcinolone   Topical BID  . ondansetron  4 mg Oral TID AC  . pantoprazole  40 mg Oral Daily  . sodium bicarbonate  650 mg Oral BID  . vancomycin  250 mg Oral Q6H   Continuous: . sodium chloride 50 mL/hr at 03/19/17 1959   PKWI:[id number]**OR** acetaminophen, ALPRAZolam, HYDROcodone-acetaminophen, metoCLOPramide, ondansetron **OR** ondansetron (ZOFRAN) IV  Assesment: She was admitted with diarrhea and dehydration and hyponatremia acute kidney injury and C. difficile colitis. At baseline she has COPD paroxysmal atrial fib congestive heart failure. She has significant issues with depression. She also has diabetes. She is remarkably improved now. Her diarrhea has essentially resolved. No more nausea. She's ready for transfer back to skilled care facility for rehabilitation Active Problems:   GERD (gastroesophageal reflux disease)   COPD (chronic obstructive pulmonary disease) (HCC)   Hypothyroidism   [place]  Hyponatremia   AKI (acute kidney injury) ([place])   Diabetes mellitus type 2 in obese [facility])   Diarrhea    Plan: Transfer back to  skilled care facility for rehabilitation    LOS: 8 days   Batya Citron L 03/20/2017, 8:44 AM

## 2017-03-20 NOTE — Discharge Summary (Signed)
Physician Discharge Summary  Patient ID: Yolanda MurrainBrenda W Blumberg MRN: [id number] DOB/AGE: 25-Dec-1940 76 y.o. Primary Care Physician:Haili Donofrio, Ramon DredgeEdward, MD Admit date: 03/12/2017 Discharge date: 03/20/2017    Discharge Diagnoses:   Active Problems:   GERD (gastroesophageal reflux disease)   COPD (chronic obstructive pulmonary disease) (HCC)   Hypothyroidism   PAF (paroxysmal atrial fibrillation) (HCC)   Hyponatremia   AKI (acute kidney injury) (HCC)   Diabetes mellitus type 2 in obese (HCC)   Diarrhea Klebsiella urinary tract infection C. difficile colitis Anxiety Depression Hypokalemia Gout Chronic diastolic heart failure Chronic kidney disease stage III Allergies as of 03/20/2017      Reactions   Ciprofloxacin Shortness Of Breath   REACTION: sob,tachycardia   Doxycycline Nausea Only   Also experienced diarrhea    Fish Oil    Patient face drew to the side,Bells Palsey   Guaifenesin Shortness Of Breath   REACTION: sob,tachycardia   Mucinex [guaifenesin Er] Shortness Of Breath   Sulfamethoxazole W/trimethoprim 800-160 [sulfamethoxazole-trimethoprim] Nausea Only   Also lack of appetite    Uloric [febuxostat] Swelling   No urination   Adhesive [tape] Other (See Comments)   Causes blisters on skin   Allopurinol Other (See Comments)   Couldn't urinate   Cefuroxime Axetil Swelling   Swelling all over body-per patient she was hospitalized as a result of taking this medication   Metolazone Nausea Only   Swelling    Tramadol    insomnia      Medication List    STOP taking these medications   ondansetron 4 MG tablet Commonly known as:  ZOFRAN   torsemide 20 MG tablet Commonly known as:  DEMADEX     TAKE these medications   acetaminophen 325 MG tablet Commonly known as:  TYLENOL Take 2 tablets (650 mg total) by mouth every 6 (six) hours as needed for mild pain (or Fever >/= 101).   albuterol 108 (90 Base) MCG/ACT inhaler Commonly known as:  PROVENTIL HFA;VENTOLIN  HFA Inhale 2 puffs into the lungs 4 (four) times daily.   allopurinol 300 MG tablet Commonly known as:  ZYLOPRIM Take 0.5 tablets (150 mg total) by mouth daily.   ALPRAZolam 0.25 MG tablet Commonly known as:  XANAX Take 1 tablet (0.25 mg total) by mouth 2 (two) times daily as needed for anxiety.   apixaban 5 MG Tabs tablet Commonly known as:  ELIQUIS Take 1 tablet (5 mg total) by mouth 2 (two) times daily.   BREO ELLIPTA 200-25 MCG/INH Aepb Generic drug:  fluticasone furoate-vilanterol Inhale 1 puff into the lungs daily.   calcium carbonate 500 MG chewable tablet Commonly known as:  TUMS - dosed in mg elemental calcium Chew 1 tablet by mouth 2 (two) times daily.   cetirizine 10 MG tablet Commonly known as:  ZYRTEC Take 10 mg by mouth daily.   cholecalciferol 1000 units tablet Commonly known as:  VITAMIN D Take 1,000 Units by mouth every morning.   colchicine 0.6 MG tablet Take 1 tablet (0.6 mg total) by mouth daily. What changed:  when to take this   diltiazem 120 MG 24 hr capsule Commonly known as:  CARDIZEM CD Take 1 capsule (120 mg total) by mouth daily.   Doxepin HCl 6 MG Tabs Commonly known as:  SILENOR Take 1 tablet (6 mg total) by mouth at bedtime as needed.   escitalopram 20 MG tablet Commonly known as:  LEXAPRO Take 1 tablet (20 mg total) by mouth daily. What changed:  medication strength  how  much to take   feeding supplement (GLUCERNA SHAKE) Liqd Take 237 mLs by mouth 3 (three) times daily between meals.   gabapentin 100 MG capsule Commonly known as:  NEURONTIN Take 100 mg by mouth 2 (two) times daily.   HYDROcodone-acetaminophen 7.5-325 MG tablet Commonly known as:  NORCO Take 1 tablet by mouth 3 (three) times daily as needed for moderate pain.   insulin glargine 100 UNIT/ML injection Commonly known as:  LANTUS Inject 0.2 mLs (20 Units total) into the skin daily.   insulin lispro 100 UNIT/ML injection Commonly known as:  HUMALOG Inject  3-20 Units into the skin 3 (three) times daily before meals. 121-150=1 units 151-200=2 units 201-250=3 units 251-300=5units 301-350=7units 351-400=9units   levothyroxine 137 MCG tablet Commonly known as:  SYNTHROID, LEVOTHROID Take 137 mcg by mouth daily before breakfast.   loperamide 2 MG tablet Commonly known as:  IMODIUM A-D Take 2 mg by mouth 4 (four) times daily as needed for diarrhea or loose stools.   metoCLOPramide 10 MG tablet Commonly known as:  REGLAN Take 0.5 tablets (5 mg total) by mouth every 6 (six) hours as needed for nausea (nausea).   metoprolol tartrate 25 MG tablet Commonly known as:  LOPRESSOR TAKE 1/2 TABLET BY MOUTH TWICE DAILY   multivitamin with minerals Tabs tablet Take 1 tablet by mouth daily.   NASACORT ALLERGY 24HR 55 MCG/ACT Aero nasal inhaler Generic drug:  triamcinolone Place 2 sprays into the nose daily.   nystatin-triamcinolone cream Commonly known as:  MYCOLOG II Apply topically 2 (two) times daily.   omeprazole 20 MG capsule Commonly known as:  PRILOSEC TAKE ONE CAPSULE BY MOUTH DAILY   ondansetron 4 MG disintegrating tablet Commonly known as:  ZOFRAN-ODT Take 1 tablet (4 mg total) by mouth 3 (three) times daily before meals.   polyvinyl alcohol 1.4 % ophthalmic solution Commonly known as:  LIQUIFILM TEARS Place 1 drop into both eyes 4 (four) times daily as needed for dry eyes.   potassium chloride SA 20 MEQ tablet Commonly known as:  K-DUR,KLOR-CON Take 1 tablet (20 mEq total) by mouth daily.   promethazine 12.5 MG tablet Commonly known as:  PHENERGAN Take 12.5 mg by mouth every 6 (six) hours as needed for nausea or vomiting.   sodium bicarbonate 650 MG tablet Take 1 tablet (650 mg total) by mouth 2 (two) times daily.   vancomycin 50 mg/mL oral solution Commonly known as:  VANCOCIN Take 5 mLs (250 mg total) by mouth every 6 (six) hours.   VENELEX Oint Apply 1 application topically See admin instructions. Apply to sacrum and  bilateral buttocks every shift and as needed.       Discharged Condition:Improved    Consults: Nephrology gastroenterology  Significant Diagnostic Studies: No results found.  Lab Results: Basic Metabolic Panel:  Recent Labs  29/56/21 0904 03/20/17 0458  NA 137 139  K 3.8 3.4*  CL 113* 115*  CO2 15* 16*  GLUCOSE 94 83  BUN 21* 14  CREATININE 1.15* 1.10*  CALCIUM 8.3* 8.3*   Liver Function Tests: No results for input(s): AST, ALT, ALKPHOS, BILITOT, PROT, ALBUMIN in the last 72 hours.   CBC:  Recent Labs  03/18/17 0904 03/20/17 0458  WBC 4.7 4.8  NEUTROABS 2.7 2.4  HGB 8.1* 7.6*  HCT 24.1* 23.3*  MCV 97.2 97.5  PLT 253 259    Recent Results (from the past 240 hour(s))  C difficile quick scan w PCR reflex     Status: Abnormal  Collection Time: 03/12/17  2:01 PM  Result Value Ref Range Status   C Diff antigen POSITIVE (A) NEGATIVE Final   C Diff toxin NEGATIVE NEGATIVE Final   C Diff interpretation Results are indeterminate. See PCR results.  Final  Gastrointestinal Panel by PCR , Stool     Status: None   Collection Time: 03/12/17  2:01 PM  Result Value Ref Range Status   Campylobacter species NOT DETECTED NOT DETECTED Final   Plesimonas shigelloides NOT DETECTED NOT DETECTED Final   Salmonella species NOT DETECTED NOT DETECTED Final   Yersinia enterocolitica NOT DETECTED NOT DETECTED Final   Vibrio species NOT DETECTED NOT DETECTED Final   Vibrio cholerae NOT DETECTED NOT DETECTED Final   Enteroaggregative E coli (EAEC) NOT DETECTED NOT DETECTED Final   Enteropathogenic E coli (EPEC) NOT DETECTED NOT DETECTED Final   Enterotoxigenic E coli (ETEC) NOT DETECTED NOT DETECTED Final   Shiga like toxin producing E coli (STEC) NOT DETECTED NOT DETECTED Final   Shigella/Enteroinvasive E coli (EIEC) NOT DETECTED NOT DETECTED Final   Cryptosporidium NOT DETECTED NOT DETECTED Final   Cyclospora cayetanensis NOT DETECTED NOT DETECTED Final   Entamoeba  histolytica NOT DETECTED NOT DETECTED Final   Giardia lamblia NOT DETECTED NOT DETECTED Final   Adenovirus F40/41 NOT DETECTED NOT DETECTED Final   Astrovirus NOT DETECTED NOT DETECTED Final   Norovirus GI/GII NOT DETECTED NOT DETECTED Final   Rotavirus A NOT DETECTED NOT DETECTED Final   Sapovirus (I, II, IV, and V) NOT DETECTED NOT DETECTED Final  Clostridium Difficile by PCR     Status: None   Collection Time: 03/12/17  2:01 PM  Result Value Ref Range Status   Toxigenic C Difficile by pcr NEGATIVE NEGATIVE Final    Comment: Patient is colonized with non toxigenic C. difficile. May not need treatment unless significant symptoms are present. Performed at [facility], [address], [place], [place] [address]   MRSA PCR Screening     Status: None   Collection Time: 03/12/17  4:11 PM  Result Value Ref Range Status   MRSA by PCR NEGATIVE NEGATIVE Final    Comment:        The GeneXpert MRSA Assay (FDA approved for NASAL specimens only), is one component of a comprehensive MRSA colonization surveillance program. It is not intended to diagnose MRSA infection nor to guide or monitor treatment for MRSA infections.   Urine Culture     Status: Abnormal   Collection Time: 03/14/17  6:01 PM  Result Value Ref Range Status   Specimen Description URINE, CLEAN CATCH  Final   Special Requests NONE  Final   Culture (A)  Final    >=100,000 COLONIES/mL KLEBSIELLA PNEUMONIAE 50,000 COLONIES/mL CITROBACTER AMALONATICUS    Report Status 03/18/2017 FINAL  Final   Organism ID, Bacteria KLEBSIELLA PNEUMONIAE (A)  Final   Organism ID, Bacteria CITROBACTER AMALONATICUS (A)  Final      Susceptibility   Citrobacter amalonaticus - MIC*    CEFAZOLIN >=64 RESISTANT Resistant     CEFTRIAXONE >=64 RESISTANT Resistant     CIPROFLOXACIN <=0.25 SENSITIVE Sensitive     GENTAMICIN <=1 SENSITIVE Sensitive     IMIPENEM <=0.25 SENSITIVE Sensitive     NITROFURANTOIN 32 SENSITIVE Sensitive      TRIMETH/SULFA <=20 SENSITIVE Sensitive     PIP/TAZO <=4 SENSITIVE Sensitive     * 50,000 COLONIES/mL CITROBACTER AMALONATICUS   Klebsiella pneumoniae - MIC*    AMPICILLIN RESISTANT Resistant  CEFAZOLIN <=4 SENSITIVE Sensitive     CEFTRIAXONE <=1 SENSITIVE Sensitive     CIPROFLOXACIN <=0.25 SENSITIVE Sensitive     GENTAMICIN <=1 SENSITIVE Sensitive     IMIPENEM <=0.25 SENSITIVE Sensitive     NITROFURANTOIN 32 SENSITIVE Sensitive     TRIMETH/SULFA <=20 SENSITIVE Sensitive     AMPICILLIN/SULBACTAM <=2 SENSITIVE Sensitive     PIP/TAZO <=4 SENSITIVE Sensitive     Extended ESBL NEGATIVE Sensitive     * >=100,000 COLONIES/mL KLEBSIELLA PNEUMONIAE     Hospital Course: This is a 76 year old who came to the hospital because of weakness. She has been in a skilled care facility. She had stopped eating or drinking much. She was found to have hyponatremia and acute on chronic kidney injury with dehydration. She had diarrhea and it was felt that she had C. difficile colitis although her markers were indeterminate. She complained of dysuria and she had a Klebsiella urinary tract infection which was treated with a single dose of antibiotic. Her diarrhea improved. She had nausea which improved. Her renal function improved. She was still weak and it was felt that she still needed skilled care facility placement.  Discharge Exam: Blood pressure (!) 118/46, pulse 91, temperature 98.3 F (36.8 C), temperature source Oral, resp. rate 20, height 5\' 1"  (1.549 m), weight 88.4 kg (194 lb 14.2 oz), SpO2 95 %. She is awake and alert. She is obese. Her chest is clear. Her heart is regular.  Disposition: To skilled care facility. She will be on low sodium heart healthy carb modified diet, she will take vancomycin 250 mg orally 4 times a day for 10 more days then twice a day for 2 weeks and then she will see her gastroenterologist for further tapering of the dose. She will restart PT OT and speech as needed. She will  need to have a CBC and basic metabolic profile every Monday and Friday for 3 weeks.  Discharge Instructions    Diet - low sodium heart healthy    Complete by:  As directed    Increase activity slowly    Complete by:  As directed         Signed: Ida Uppal L   03/20/2017, 8:53 AM

## 2017-03-20 NOTE — Progress Notes (Signed)
Patient discharged back to [facility], report given to nursing staff.

## 2017-03-20 NOTE — Telephone Encounter (Signed)
[facility] [employer]

## 2017-03-20 NOTE — Care Management Important Message (Signed)
Important Message  Patient Details  Name: Ashley MurrainBrenda W Graber MRN: [id number] Date of Birth: 1941-05-10   Medicare Important Message Given:  Yes    Malcolm MetroChildress, Reene Harlacher Demske, RN 03/20/2017, 9:56 AM

## 2017-03-20 NOTE — Telephone Encounter (Signed)
He is overbooked in 2 weeks.  Please watch schedule on his return on 7/10 and if someone cancels add her please.  If not we will have to look again and get her seen

## 2017-03-22 ENCOUNTER — Encounter (HOSPITAL_COMMUNITY)
Admission: RE | Admit: 2017-03-22 | Discharge: 2017-03-22 | Disposition: A | Discharge status: Home / Self Care | Payer: Medicare Other | Source: Skilled Nursing Facility | Attending: Pulmonary Disease | Admitting: Pulmonary Disease

## 2017-03-22 DIAGNOSIS — E871 Hypo-osmolality and hyponatremia: Secondary | ICD-10-CM | POA: Insufficient documentation

## 2017-03-22 DIAGNOSIS — F3340 Major depressive disorder, recurrent, in remission, unspecified: Secondary | ICD-10-CM | POA: Insufficient documentation

## 2017-03-22 DIAGNOSIS — I5032 Chronic diastolic (congestive) heart failure: Secondary | ICD-10-CM | POA: Insufficient documentation

## 2017-03-22 DIAGNOSIS — A0472 Enterocolitis due to Clostridium difficile, not specified as recurrent: Secondary | ICD-10-CM | POA: Insufficient documentation

## 2017-03-22 LAB — CBC
HCT: 24.5 % — ABNORMAL LOW (ref 36.0–46.0)
HEMOGLOBIN: 8.1 g/dL — AB (ref 12.0–15.0)
MCH: 32.4 pg (ref 26.0–34.0)
MCHC: 33.1 g/dL (ref 30.0–36.0)
MCV: 98 fL (ref 78.0–100.0)
Platelets: 236 10*3/uL (ref 150–400)
RBC: 2.5 MIL/uL — ABNORMAL LOW (ref 3.87–5.11)
RDW: 16.3 % — AB (ref 11.5–15.5)
WBC: 6.1 10*3/uL (ref 4.0–10.5)

## 2017-03-22 LAB — BASIC METABOLIC PANEL
Anion gap: 7 (ref 5–15)
BUN: 11 mg/dL (ref 6–20)
CALCIUM: 8.4 mg/dL — AB (ref 8.9–10.3)
CHLORIDE: 114 mmol/L — AB (ref 101–111)
CO2: 18 mmol/L — AB (ref 22–32)
CREATININE: 1.3 mg/dL — AB (ref 0.44–1.00)
GFR calc Af Amer: 46 mL/min — ABNORMAL LOW (ref >60)
GFR calc non Af Amer: 39 mL/min — ABNORMAL LOW (ref >60)
Glucose, Bld: 91 mg/dL (ref 65–99)
Potassium: 4.1 mmol/L (ref 3.5–5.1)
SODIUM: 139 mmol/L (ref 135–145)

## 2017-03-25 ENCOUNTER — Other Ambulatory Visit (HOSPITAL_COMMUNITY)
Admission: RE | Admit: 2017-03-25 | Discharge: 2017-03-25 | Disposition: A | Discharge status: Home / Self Care | Payer: Medicare Other | Source: Other Acute Inpatient Hospital | Attending: Pulmonary Disease | Admitting: Pulmonary Disease

## 2017-03-25 DIAGNOSIS — E871 Hypo-osmolality and hyponatremia: Secondary | ICD-10-CM | POA: Insufficient documentation

## 2017-03-25 LAB — BASIC METABOLIC PANEL
ANION GAP: 8 (ref 5–15)
BUN: 9 mg/dL (ref 6–20)
CALCIUM: 8.4 mg/dL — AB (ref 8.9–10.3)
CHLORIDE: 113 mmol/L — AB (ref 101–111)
CO2: 20 mmol/L — AB (ref 22–32)
Creatinine, Ser: 1.2 mg/dL — ABNORMAL HIGH (ref 0.44–1.00)
GFR calc non Af Amer: 43 mL/min — ABNORMAL LOW (ref >60)
GFR, EST AFRICAN AMERICAN: 50 mL/min — AB (ref >60)
Glucose, Bld: 79 mg/dL (ref 65–99)
Potassium: 3.9 mmol/L (ref 3.5–5.1)
Sodium: 141 mmol/L (ref 135–145)

## 2017-03-25 LAB — CBC
HCT: 25.3 % — ABNORMAL LOW (ref 36.0–46.0)
HEMOGLOBIN: 8.2 g/dL — AB (ref 12.0–15.0)
MCH: 31.8 pg (ref 26.0–34.0)
MCHC: 32.4 g/dL (ref 30.0–36.0)
MCV: 98.1 fL (ref 78.0–100.0)
Platelets: 259 10*3/uL (ref 150–400)
RBC: 2.58 MIL/uL — AB (ref 3.87–5.11)
RDW: 16.7 % — ABNORMAL HIGH (ref 11.5–15.5)
WBC: 6 10*3/uL (ref 4.0–10.5)

## 2017-03-27 NOTE — H&P (Signed)
Yolanda [name] MRN: [id number] DOB/AGE: August 30, 1941 76 y.o. Primary Care Physician:Dorlisa Savino, Ramon Dredge, MD Admit date: 03/20/2017 Chief Complaint: 4 rehabilitation HPI: This is documentation of the history and physical performed at the nursing home on 03/24/2017. It was not dictated at the skilled care facility because they do not have dragon available. She had been in the hospital with hyponatremia C. difficile colitis failure to thrive dehydration and acute kidney injury. She has multiple medical problems including the C. 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 She was in her usual state of poor health. She was treated in the hospital improved had GI consultation was felt that she needed prolonged treatment for the colitis and that is undergoing treatment now. She says she feels okay. She still has some nausea. No swelling now. No shortness of breath except her baseline now. She still feels weak.  Past Medical History:  Diagnosis Date  . Barrett esophagus   . Bronchitis   . C. difficile colitis   . Cellulitis   . Cellulitis   . CHF (congestive heart failure) (HCC)   . COPD (chronic obstructive pulmonary disease) (HCC)   . Diabetes mellitus without complication (HCC)   . Diastolic heart failure (HCC)   . Difficulty walking   . Dysphagia   . Essential hypertension   . Gastritis   . GERD (gastroesophageal reflux disease)   . Gout   . Hepatomegaly    ???  . History of falling   . Hyponatremia   . Hypothyroidism   . Iron deficiency anemia   . Muscle weakness   . PAF (paroxysmal atrial fibrillation) (HCC)    Eliquis  . Sleep apnea   . Stage III chronic kidney disease    Past Surgical History:  Procedure Laterality Date  . BREAST BIOPSY Right Sept, 2012  . CHOLECYSTECTOMY    . COLONOSCOPY    . ESOPHAGEAL DILATION N/A 02/05/2017   Procedure: ESOPHAGEAL DILATION;  Surgeon: Malissa Hippo, MD;   Location: [facility];  Service: Endoscopy;  Laterality: N/A;  . ESOPHAGOGASTRODUODENOSCOPY N/A 02/05/2017   Procedure: ESOPHAGOGASTRODUODENOSCOPY (EGD);  Surgeon: Malissa Hippo, MD;  Location: [facility];  Service: Endoscopy;  Laterality: N/A;  . UPPER GASTROINTESTINAL ENDOSCOPY          Family History  Problem Relation Age of Onset  . Dementia Mother   . Lung cancer Father        smoked  . Hypertension Sister   . Diabetes Brother   . Obesity Sister   . Hypertension Sister   . Restless legs syndrome Sister   . Healthy Sister   . Lung cancer Maternal Uncle     Social History:  reports that she quit smoking about 17 years ago. Her smoking use included Cigarettes. She started smoking about 58 years ago. She has a 15.00 pack-year smoking history. She has never used smokeless tobacco. She reports that she does not drink alcohol or use drugs.   Allergies:  Allergies  Allergen Reactions  . Ciprofloxacin Shortness Of Breath    REACTION: sob,tachycardia  . Doxycycline Nausea Only    Also experienced diarrhea   . Fish Oil     Patient face drew to the side,Bells Palsey  . Guaifenesin Shortness Of Breath    REACTION: sob,tachycardia  . Mucinex [Guaifenesin Er] Shortness Of Breath  . Sulfamethoxazole W/Trimethoprim 800-160 [Sulfamethoxazole-Trimethoprim] Nausea Only    Also lack of appetite   . Uloric [  Febuxostat] Swelling    No urination  . Adhesive [Tape] Other (See Comments)    Causes blisters on skin  . Allopurinol Other (See Comments)    Couldn't urinate  . Cefuroxime Axetil Swelling    Swelling all over body-per patient she was hospitalized as a result of taking this medication  . Metolazone Nausea Only    Swelling   . Tramadol     insomnia    Medications Prior to Admission  Medication Sig Dispense Refill  . acetaminophen (TYLENOL) 325 MG tablet Take 2 tablets (650 mg total) by mouth every 6 (six) hours as needed for mild pain (or Fever >/= 101).    Marland Kitchen.  albuterol (PROVENTIL HFA;VENTOLIN HFA) 108 (90 Base) MCG/ACT inhaler Inhale 2 puffs into the lungs 4 (four) times daily.     Marland Kitchen. allopurinol (ZYLOPRIM) 300 MG tablet Take 0.5 tablets (150 mg total) by mouth daily.    Marland Kitchen. ALPRAZolam (XANAX) 0.25 MG tablet Take 1 tablet (0.25 mg total) by mouth 2 (two) times daily as needed for anxiety. 60 tablet 0  . apixaban (ELIQUIS) 5 MG TABS tablet Take 1 tablet (5 mg total) by mouth 2 (two) times daily. 60 tablet 5  . Balsam [place]-Castor Oil (VENELEX) OINT Apply 1 application topically See admin instructions. Apply to sacrum and bilateral buttocks every shift and as needed.    . calcium carbonate (TUMS - DOSED IN MG ELEMENTAL CALCIUM) 500 MG chewable tablet Chew 1 tablet by mouth 2 (two) times daily.    . cetirizine (ZYRTEC) 10 MG tablet Take 10 mg by mouth daily.    . cholecalciferol (VITAMIN D) 1000 units tablet Take 1,000 Units by mouth every morning.    . colchicine 0.6 MG tablet Take 1 tablet (0.6 mg total) by mouth daily.    Marland Kitchen. diltiazem (CARDIZEM CD) 120 MG 24 hr capsule Take 1 capsule (120 mg total) by mouth daily. 90 capsule 3  . Doxepin HCl (SILENOR) 6 MG TABS Take 1 tablet (6 mg total) by mouth at bedtime as needed. 30 tablet   . escitalopram (LEXAPRO) 20 MG tablet Take 1 tablet (20 mg total) by mouth daily. 30 tablet 12  . feeding supplement, GLUCERNA SHAKE, (GLUCERNA SHAKE) LIQD Take 237 mLs by mouth 3 (three) times daily between meals.  0  . fluticasone furoate-vilanterol (BREO ELLIPTA) 200-25 MCG/INH AEPB Inhale 1 puff into the lungs daily.    Marland Kitchen. gabapentin (NEURONTIN) 100 MG capsule Take 100 mg by mouth 2 (two) times daily.    Marland Kitchen. HYDROcodone-acetaminophen (NORCO) 7.5-325 MG tablet Take 1 tablet by mouth 3 (three) times daily as needed for moderate pain.    Marland Kitchen. insulin glargine (LANTUS) 100 UNIT/ML injection Inject 0.2 mLs (20 Units total) into the skin daily. 10 mL 11  . insulin lispro (HUMALOG) 100 UNIT/ML injection Inject 3-20 Units into the skin 3  (three) times daily before meals. 121-150=1 units 151-200=2 units 201-250=3 units 251-300=5units 301-350=7units 351-400=9units    . levothyroxine (SYNTHROID, LEVOTHROID) 137 MCG tablet Take 137 mcg by mouth daily before breakfast.    . loperamide (IMODIUM A-D) 2 MG tablet Take 2 mg by mouth 4 (four) times daily as needed for diarrhea or loose stools.    . metoCLOPramide (REGLAN) 10 MG tablet Take 0.5 tablets (5 mg total) by mouth every 6 (six) hours as needed for nausea (nausea). 6 tablet 0  . metoprolol tartrate (LOPRESSOR) 25 MG tablet TAKE 1/2 TABLET BY MOUTH TWICE DAILY 90 tablet 1  . Multiple Vitamin (  MULTIVITAMIN WITH MINERALS) TABS tablet Take 1 tablet by mouth daily.    Marland Kitchen nystatin-triamcinolone (MYCOLOG II) cream Apply topically 2 (two) times daily. 30 g 0  . omeprazole (PRILOSEC) 20 MG capsule TAKE ONE CAPSULE BY MOUTH DAILY 90 capsule 3  . ondansetron (ZOFRAN-ODT) 4 MG disintegrating tablet Take 1 tablet (4 mg total) by mouth 3 (three) times daily before meals. 20 tablet 0  . polyvinyl alcohol (LIQUIFILM TEARS) 1.4 % ophthalmic solution Place 1 drop into both eyes 4 (four) times daily as needed for dry eyes. 15 mL 0  . potassium chloride SA (K-DUR,KLOR-CON) 20 MEQ tablet Take 1 tablet (20 mEq total) by mouth daily. (Patient not taking: Reported on 03/12/2017)    . promethazine (PHENERGAN) 12.5 MG tablet Take 12.5 mg by mouth every 6 (six) hours as needed for nausea or vomiting.    . sodium bicarbonate 650 MG tablet Take 1 tablet (650 mg total) by mouth 2 (two) times daily.    Marland Kitchen triamcinolone (NASACORT ALLERGY 24HR) 55 MCG/ACT AERO nasal inhaler Place 2 sprays into the nose daily.    . vancomycin (VANCOCIN) 50 mg/mL oral solution Take 5 mLs (250 mg total) by mouth every 6 (six) hours.         GEX:BMWUX from the symptoms mentioned above,there are no other symptoms referable to all systems reviewed.  Physical Exam: There were no vitals taken for this visit. Constitutional: She is  awake and alert and in no acute distress. She is obese. Eyes: Pupils react EOMI. Ears nose mouth and throat: Her mucous membranes are moist. Throat is clear. Hearing is grossly normal. Cardiovascular: Her heart is regular with normal heart sounds. No edema now. Respiratory: Her respiratory effort is normal. Her lungs are clear. Gastrointestinal: She still has some minimal abdominal tenderness. Skin: Warm and dry. Musculoskeletal: She has weakness of the muscles in general. Neurological: No focal abnormalities. Psychiatric: She still appears somewhat depressed.    Recent Labs  03/25/17 0410  WBC 6.0  HGB 8.2*  HCT 25.3*  MCV 98.1  PLT 259    Recent Labs  03/25/17 0410  NA 141  K 3.9  CL 113*  CO2 20*  GLUCOSE 79  BUN 9  CREATININE 1.20*  CALCIUM 8.4*  lablast2(ast:2,ALT:2,alkphos:2,bilitot:2,prot:2,albumin:2)@    No results found for this or any previous visit (from the past 240 hour(s)).   No results found. Impression: She is admitted for rehabilitation. She's had failure to thrive. She's been sick for about 3 months now. She's had episodes of hyponatremia dehydration acute on chronic renal failure but she's doing better now. She is going to be participating in rehabilitation with plans to eventually get her home. Her situation is complicated by the fact that she has multiple medical problems including COPD congestive heart failure recurrent cellulitis C. difficile colitis nausea from that diarrhea from that and she has depression. Active Problems:   * No active hospital problems. *     Plan: Continue current treatments. She seems to be doing better.      Jaire Pinkham L   03/27/2017, 9:19 AM

## 2017-03-28 NOTE — Telephone Encounter (Signed)
Patient was given an appointment for 04/02/17 at 10:00am with Dr. Karilyn Cotaehman.  [facility].

## 2017-03-29 ENCOUNTER — Encounter (HOSPITAL_COMMUNITY)
Admission: RE | Admit: 2017-03-29 | Discharge: 2017-03-29 | Disposition: A | Discharge status: Home / Self Care | Payer: Medicare Other | Source: Skilled Nursing Facility | Attending: Pulmonary Disease | Admitting: Pulmonary Disease

## 2017-03-29 DIAGNOSIS — E871 Hypo-osmolality and hyponatremia: Secondary | ICD-10-CM | POA: Insufficient documentation

## 2017-03-29 LAB — CBC
HEMATOCRIT: 24.6 % — AB (ref 36.0–46.0)
Hemoglobin: 8 g/dL — ABNORMAL LOW (ref 12.0–15.0)
MCH: 31.7 pg (ref 26.0–34.0)
MCHC: 32.5 g/dL (ref 30.0–36.0)
MCV: 97.6 fL (ref 78.0–100.0)
PLATELETS: 255 10*3/uL (ref 150–400)
RBC: 2.52 MIL/uL — ABNORMAL LOW (ref 3.87–5.11)
RDW: 16.7 % — AB (ref 11.5–15.5)
WBC: 5.6 10*3/uL (ref 4.0–10.5)

## 2017-03-29 LAB — BASIC METABOLIC PANEL
Anion gap: 6 (ref 5–15)
BUN: 7 mg/dL (ref 6–20)
CO2: 23 mmol/L (ref 22–32)
CREATININE: 1.72 mg/dL — AB (ref 0.44–1.00)
Calcium: 7.8 mg/dL — ABNORMAL LOW (ref 8.9–10.3)
Chloride: 109 mmol/L (ref 101–111)
GFR calc Af Amer: 33 mL/min — ABNORMAL LOW (ref >60)
GFR calc non Af Amer: 28 mL/min — ABNORMAL LOW (ref >60)
GLUCOSE: 58 mg/dL — AB (ref 65–99)
POTASSIUM: 4.1 mmol/L (ref 3.5–5.1)
Sodium: 138 mmol/L (ref 135–145)

## 2017-03-29 NOTE — Progress Notes (Signed)
This is documentation of my visit of 03/28/2017 at the skilled care facility. This was dictated today because dragon is not available at the skilled care facility.  Subjective: After I saw her on the first her family came to see me on the second and said she was having a lot more trouble. When I saw her on the first she looks very good but apparently she developed increasing problems with nausea and had more swelling. By phone I have started her on Zofran before every meal which had been planned and ordered previously but according to the family she was only getting it once a day. I'm not certain what happened but she does say that she is getting it with each meal now. She was started on torsemide and says her fluid is better. She has no other new complaints. She still has some minimal diarrhea  Objective: Constitutional: She is awake and alert and in no acute distress. She is obese. Cardiovascular: She has normal heart rate her heart is regular and she has what looks like third spacing of fluid. Respiratory: Her respiratory rate is normal her respiratory effort is normal and her lungs are clear. Gastrointestinal: Her abdomen is soft with no masses. Skin: She does have puffiness generally. Psychiatric: She seems a little better as far as the depression is concerned  Assessment: I think she's better than she was earlier this week. We'll plan to continue treatments. She may need longer on the torsemide but I'm concerned because she's developed acute renal failure on multiple occasions.  Plan: She will have repeat blood testing. She will continue current treatments. She has appointment scheduled with gastroenterology next week.

## 2017-04-01 ENCOUNTER — Encounter (HOSPITAL_COMMUNITY)
Admission: RE | Admit: 2017-04-01 | Discharge: 2017-04-01 | Disposition: A | Discharge status: Home / Self Care | Payer: Medicare Other | Source: Skilled Nursing Facility | Attending: Pulmonary Disease | Admitting: Pulmonary Disease

## 2017-04-01 LAB — CBC WITH DIFFERENTIAL/PLATELET
BASOS PCT: 1 %
Basophils Absolute: 0 10*3/uL (ref 0.0–0.1)
Eosinophils Absolute: 0.4 10*3/uL (ref 0.0–0.7)
Eosinophils Relative: 7 %
HCT: 27.9 % — ABNORMAL LOW (ref 36.0–46.0)
Hemoglobin: 9.2 g/dL — ABNORMAL LOW (ref 12.0–15.0)
LYMPHS ABS: 2.3 10*3/uL (ref 0.7–4.0)
Lymphocytes Relative: 36 %
MCH: 32.5 pg (ref 26.0–34.0)
MCHC: 33 g/dL (ref 30.0–36.0)
MCV: 98.6 fL (ref 78.0–100.0)
MONOS PCT: 10 %
Monocytes Absolute: 0.7 10*3/uL (ref 0.1–1.0)
NEUTROS ABS: 3.1 10*3/uL (ref 1.7–7.7)
NEUTROS PCT: 46 %
PLATELETS: 332 10*3/uL (ref 150–400)
RBC: 2.83 MIL/uL — AB (ref 3.87–5.11)
RDW: 16.7 % — AB (ref 11.5–15.5)
WBC: 6.5 10*3/uL (ref 4.0–10.5)

## 2017-04-01 LAB — BASIC METABOLIC PANEL
ANION GAP: 11 (ref 5–15)
BUN: 6 mg/dL (ref 6–20)
CALCIUM: 7.9 mg/dL — AB (ref 8.9–10.3)
CO2: 20 mmol/L — ABNORMAL LOW (ref 22–32)
Chloride: 107 mmol/L (ref 101–111)
Creatinine, Ser: 1.79 mg/dL — ABNORMAL HIGH (ref 0.44–1.00)
GFR calc Af Amer: 31 mL/min — ABNORMAL LOW (ref >60)
GFR, EST NON AFRICAN AMERICAN: 27 mL/min — AB (ref >60)
GLUCOSE: 91 mg/dL (ref 65–99)
Potassium: 4.3 mmol/L (ref 3.5–5.1)
SODIUM: 138 mmol/L (ref 135–145)

## 2017-04-02 ENCOUNTER — Other Ambulatory Visit (HOSPITAL_COMMUNITY)
Admission: AD | Admit: 2017-04-02 | Discharge: 2017-04-02 | Disposition: A | Discharge status: Home / Self Care | Payer: Medicare Other | Source: Skilled Nursing Facility | Attending: Pulmonary Disease | Admitting: Pulmonary Disease

## 2017-04-02 ENCOUNTER — Ambulatory Visit (INDEPENDENT_AMBULATORY_CARE_PROVIDER_SITE_OTHER): Payer: Medicare Other | Admitting: Internal Medicine

## 2017-04-02 ENCOUNTER — Encounter (INDEPENDENT_AMBULATORY_CARE_PROVIDER_SITE_OTHER): Payer: Self-pay | Admitting: Internal Medicine

## 2017-04-02 VITALS — BP 108/60 | HR 66 | Temp 97.1°F | Resp 18 | Ht 61.5 in | Wt 192.0 lb

## 2017-04-02 DIAGNOSIS — R197 Diarrhea, unspecified: Secondary | ICD-10-CM

## 2017-04-02 DIAGNOSIS — D508 Other iron deficiency anemias: Secondary | ICD-10-CM

## 2017-04-02 DIAGNOSIS — R195 Other fecal abnormalities: Secondary | ICD-10-CM | POA: Insufficient documentation

## 2017-04-02 LAB — C DIFFICILE QUICK SCREEN W PCR REFLEX
C DIFFICLE (CDIFF) ANTIGEN: NEGATIVE
C Diff interpretation: NOT DETECTED
C Diff toxin: NEGATIVE

## 2017-04-02 MED ORDER — LOPERAMIDE HCL 2 MG PO CAPS: 2.0000 mg | ORAL_CAPSULE | ORAL | 5 refills | Status: DC

## 2017-04-02 MED ORDER — FLINTSTONES PLUS IRON PO CHEW: 1.0000 | CHEWABLE_TABLET | Freq: Two times a day (BID) | ORAL | Status: DC

## 2017-04-02 NOTE — Progress Notes (Signed)
Presenting complaint;  Follow-up for iron deficiency anemia and diarrhea.  Subjective:  Yolanda [name] is 76 year old Caucasian female was here for scheduled visit. She was recently hospitalized for multiple medical problems including hyponatremia anemia and diarrhea. She also had acute kidney injury and urinary tract infection. Stool studies revealed positive C. difficile antigen but toxin was negative. She previously has been treated with antibiotics and therefore was begun on vancomycin. Renal function improved with hydration. Patient was discharged and transferred to [facility] for rehabilitation on 03/20/2017.  She is accompanied by 2 family members. She is feeling better. However she remains with poor appetite and she has dry heaving sometimes just on smelling or looking at food. She is having anywhere from 2-8 bowel movements per day. She still having accidents. She denies melena or rectal bleeding. She also denies abdominal pain. She has noted tremors to both hands.   Current Medications: Outpatient Encounter Prescriptions as of 04/02/2017  Medication Sig  . acetaminophen (TYLENOL) 325 MG tablet Take 2 tablets (650 mg total) by mouth every 6 (six) hours as needed for mild pain (or Fever >/= 101).  Marland Kitchen albuterol (PROVENTIL HFA;VENTOLIN HFA) 108 (90 Base) MCG/ACT inhaler Inhale 2 puffs into the lungs 4 (four) times daily.   Marland Kitchen allopurinol (ZYLOPRIM) 300 MG tablet Take 0.5 tablets (150 mg total) by mouth daily.  Marland Kitchen ALPRAZolam (XANAX) 0.25 MG tablet Take 1 tablet (0.25 mg total) by mouth 2 (two) times daily as needed for anxiety.  Marland Kitchen apixaban (ELIQUIS) 5 MG TABS tablet Take 1 tablet (5 mg total) by mouth 2 (two) times daily.  Lucilla Lame [place]-Castor Oil (VENELEX) OINT Apply 1 application topically See admin instructions. Apply to sacrum and bilateral buttocks every shift and as needed.  . calcium carbonate (TUMS - DOSED IN MG ELEMENTAL CALCIUM) 500 MG chewable tablet Chew 1 tablet by mouth 2 (two) times  daily.  . cetirizine (ZYRTEC) 10 MG tablet Take 10 mg by mouth daily.  . cholecalciferol (VITAMIN D) 1000 units tablet Take 1,000 Units by mouth every morning.  . colchicine 0.6 MG tablet Take 1 tablet (0.6 mg total) by mouth daily.  Marland Kitchen diltiazem (CARDIZEM CD) 120 MG 24 hr capsule Take 1 capsule (120 mg total) by mouth daily.  . Doxepin HCl (SILENOR) 6 MG TABS Take 1 tablet (6 mg total) by mouth at bedtime as needed.  Marland Kitchen escitalopram (LEXAPRO) 20 MG tablet Take 1 tablet (20 mg total) by mouth daily.  . feeding supplement, GLUCERNA SHAKE, (GLUCERNA SHAKE) LIQD Take 237 mLs by mouth 3 (three) times daily between meals.  . fluticasone furoate-vilanterol (BREO ELLIPTA) 200-25 MCG/INH AEPB Inhale 1 puff into the lungs daily.  Marland Kitchen gabapentin (NEURONTIN) 100 MG capsule Take 100 mg by mouth 2 (two) times daily.  Marland Kitchen HYDROcodone-acetaminophen (NORCO) 7.5-325 MG tablet Take 1 tablet by mouth 3 (three) times daily as needed for moderate pain.  Marland Kitchen insulin glargine (LANTUS) 100 UNIT/ML injection Inject 0.2 mLs (20 Units total) into the skin daily.  . insulin lispro (HUMALOG) 100 UNIT/ML injection Inject 3-20 Units into the skin 3 (three) times daily before meals. 121-150=1 units 151-200=2 units 201-250=3 units 251-300=5units 301-350=7units 351-400=9units  . levothyroxine (SYNTHROID, LEVOTHROID) 137 MCG tablet Take 137 mcg by mouth daily before breakfast.  . loperamide (IMODIUM A-D) 2 MG tablet Take 2 mg by mouth 4 (four) times daily as needed for diarrhea or loose stools.  . metoCLOPramide (REGLAN) 10 MG tablet Take 0.5 tablets (5 mg total) by mouth every 6 (six) hours as  needed for nausea (nausea).  . metoprolol tartrate (LOPRESSOR) 25 MG tablet TAKE 1/2 TABLET BY MOUTH TWICE DAILY  . Multiple Vitamin (MULTIVITAMIN WITH MINERALS) TABS tablet Take 1 tablet by mouth daily.  Marland Kitchen. nystatin-triamcinolone (MYCOLOG II) cream Apply topically 2 (two) times daily.  Marland Kitchen. omeprazole (PRILOSEC) 20 MG capsule TAKE ONE CAPSULE BY  MOUTH DAILY  . ondansetron (ZOFRAN-ODT) 4 MG disintegrating tablet Take 1 tablet (4 mg total) by mouth 3 (three) times daily before meals.  . polyvinyl alcohol (LIQUIFILM TEARS) 1.4 % ophthalmic solution Place 1 drop into both eyes 4 (four) times daily as needed for dry eyes.  . potassium chloride SA (K-DUR,KLOR-CON) 20 MEQ tablet Take 1 tablet (20 mEq total) by mouth daily.  . promethazine (PHENERGAN) 12.5 MG tablet Take 12.5 mg by mouth every 6 (six) hours as needed for nausea or vomiting.  . sodium bicarbonate 650 MG tablet Take 1 tablet (650 mg total) by mouth 2 (two) times daily.  Marland Kitchen. triamcinolone (NASACORT ALLERGY 24HR) 55 MCG/ACT AERO nasal inhaler Place 2 sprays into the nose daily.  . vancomycin (VANCOCIN) 50 mg/mL oral solution Take 5 mLs (250 mg total) by mouth every 6 (six) hours.   No facility-administered encounter medications on file as of 04/02/2017.      Objective: Blood pressure 108/60, pulse 66, temperature (!) 97.1 F (36.2 C), temperature source Oral, resp. rate 18, height 5' 1.5" (1.562 m), weight 192 lb (87.1 kg). Patient is alert. She is comfortable sitting in a chair. Conjunctiva is pale. Sclera is nonicteric Oropharyngeal mucosa is normal. No neck masses or thyromegaly noted. Cardiac exam with regular rhythm normal S1 and S2. No murmur or gallop noted. Lungs are clear to auscultation. Abdomen is full but soft and nontender without organomegaly or masses. No LE edema or clubbing noted.  Labs/studies Results:   Recent Labs  04/01/17 0830  WBC 6.5  HGB 9.2*  HCT 27.9*  PLT 332    BMET   Recent Labs  04/01/17 0830  NA 138  K 4.3  CL 107  CO2 20*  GLUCOSE 91  BUN 6  CREATININE 1.79*  CALCIUM 7.9*     Assessment:  #1. Diarrhea. She remains on vancomycin but still having diarrhea. 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 She may also have IBS. Will repeat stool studies before changing vancomycin dose. #2. Iron deficiency  anemia. H&H has improved. No evidence of overt GI bleed. #3. Tremors involving upper extremities may be due to metoclopramide.   Plan:  Discontinue metoclopramide and use ondansetron for nausea. CBC in 2 weeks. C. difficile antigen and toxin assay. Office visit in 2 months.

## 2017-04-02 NOTE — Patient Instructions (Signed)
Physician will call with results of stool test. Next blood work to be done in 2 weeks.

## 2017-04-03 ENCOUNTER — Other Ambulatory Visit (INDEPENDENT_AMBULATORY_CARE_PROVIDER_SITE_OTHER): Payer: Self-pay | Admitting: *Deleted

## 2017-04-03 ENCOUNTER — Encounter (INDEPENDENT_AMBULATORY_CARE_PROVIDER_SITE_OTHER): Payer: Self-pay | Admitting: *Deleted

## 2017-04-03 DIAGNOSIS — K760 Fatty (change of) liver, not elsewhere classified: Secondary | ICD-10-CM

## 2017-04-03 DIAGNOSIS — D508 Other iron deficiency anemias: Secondary | ICD-10-CM

## 2017-04-03 DIAGNOSIS — R197 Diarrhea, unspecified: Secondary | ICD-10-CM

## 2017-04-04 ENCOUNTER — Encounter (HOSPITAL_COMMUNITY)
Admission: RE | Admit: 2017-04-04 | Discharge: 2017-04-04 | Disposition: A | Discharge status: Home / Self Care | Payer: Medicare Other | Source: Skilled Nursing Facility | Attending: Pulmonary Disease | Admitting: Pulmonary Disease

## 2017-04-04 LAB — URINALYSIS, ROUTINE W REFLEX MICROSCOPIC
Bilirubin Urine: NEGATIVE
Glucose, UA: NEGATIVE mg/dL
KETONES UR: NEGATIVE mg/dL
Nitrite: NEGATIVE
Protein, ur: 30 mg/dL — AB
SPECIFIC GRAVITY, URINE: 1.011 (ref 1.005–1.030)
SQUAMOUS EPITHELIAL / LPF: NONE SEEN
pH: 5 (ref 5.0–8.0)

## 2017-04-04 LAB — CBC
HCT: 23.6 % — ABNORMAL LOW (ref 36.0–46.0)
Hemoglobin: 7.8 g/dL — ABNORMAL LOW (ref 12.0–15.0)
MCH: 32.2 pg (ref 26.0–34.0)
MCHC: 33.1 g/dL (ref 30.0–36.0)
MCV: 97.5 fL (ref 78.0–100.0)
PLATELETS: 212 10*3/uL (ref 150–400)
RBC: 2.42 MIL/uL — AB (ref 3.87–5.11)
RDW: 16.7 % — ABNORMAL HIGH (ref 11.5–15.5)
WBC: 6.6 10*3/uL (ref 4.0–10.5)

## 2017-04-04 LAB — BASIC METABOLIC PANEL
Anion gap: 6 (ref 5–15)
BUN: 7 mg/dL (ref 6–20)
CALCIUM: 7.4 mg/dL — AB (ref 8.9–10.3)
CHLORIDE: 106 mmol/L (ref 101–111)
CO2: 23 mmol/L (ref 22–32)
CREATININE: 2.81 mg/dL — AB (ref 0.44–1.00)
GFR, EST AFRICAN AMERICAN: 18 mL/min — AB (ref >60)
GFR, EST NON AFRICAN AMERICAN: 16 mL/min — AB (ref >60)
Glucose, Bld: 101 mg/dL — ABNORMAL HIGH (ref 65–99)
Potassium: 5.4 mmol/L — ABNORMAL HIGH (ref 3.5–5.1)
SODIUM: 135 mmol/L (ref 135–145)

## 2017-04-05 ENCOUNTER — Inpatient Hospital Stay (HOSPITAL_COMMUNITY)
Admission: EM | Admit: 2017-04-05 | Discharge: 2017-04-19 | DRG: 689 | Disposition: A | Discharge status: Home / Self Care | Payer: Medicare Other | Attending: Nephrology | Admitting: Nephrology

## 2017-04-05 ENCOUNTER — Emergency Department (HOSPITAL_COMMUNITY): Payer: Medicare Other

## 2017-04-05 ENCOUNTER — Encounter (HOSPITAL_COMMUNITY): Payer: Self-pay

## 2017-04-05 DIAGNOSIS — E669 Obesity, unspecified: Secondary | ICD-10-CM | POA: Diagnosis present

## 2017-04-05 DIAGNOSIS — E860 Dehydration: Secondary | ICD-10-CM | POA: Diagnosis present

## 2017-04-05 DIAGNOSIS — E870 Hyperosmolality and hypernatremia: Secondary | ICD-10-CM | POA: Diagnosis present

## 2017-04-05 DIAGNOSIS — I5032 Chronic diastolic (congestive) heart failure: Secondary | ICD-10-CM | POA: Diagnosis present

## 2017-04-05 DIAGNOSIS — E1121 Type 2 diabetes mellitus with diabetic nephropathy: Secondary | ICD-10-CM | POA: Diagnosis present

## 2017-04-05 DIAGNOSIS — Z9981 Dependence on supplemental oxygen: Secondary | ICD-10-CM

## 2017-04-05 DIAGNOSIS — I130 Hypertensive heart and chronic kidney disease with heart failure and stage 1 through stage 4 chronic kidney disease, or unspecified chronic kidney disease: Secondary | ICD-10-CM | POA: Diagnosis present

## 2017-04-05 DIAGNOSIS — K219 Gastro-esophageal reflux disease without esophagitis: Secondary | ICD-10-CM | POA: Diagnosis present

## 2017-04-05 DIAGNOSIS — Z881 Allergy status to other antibiotic agents status: Secondary | ICD-10-CM

## 2017-04-05 DIAGNOSIS — I480 Paroxysmal atrial fibrillation: Secondary | ICD-10-CM | POA: Diagnosis present

## 2017-04-05 DIAGNOSIS — E1169 Type 2 diabetes mellitus with other specified complication: Secondary | ICD-10-CM | POA: Diagnosis present

## 2017-04-05 DIAGNOSIS — Z882 Allergy status to sulfonamides status: Secondary | ICD-10-CM

## 2017-04-05 DIAGNOSIS — N184 Chronic kidney disease, stage 4 (severe): Secondary | ICD-10-CM | POA: Diagnosis present

## 2017-04-05 DIAGNOSIS — G4730 Sleep apnea, unspecified: Secondary | ICD-10-CM | POA: Diagnosis present

## 2017-04-05 DIAGNOSIS — Z7901 Long term (current) use of anticoagulants: Secondary | ICD-10-CM

## 2017-04-05 DIAGNOSIS — N179 Acute kidney failure, unspecified: Secondary | ICD-10-CM | POA: Diagnosis not present

## 2017-04-05 DIAGNOSIS — E8770 Fluid overload, unspecified: Secondary | ICD-10-CM

## 2017-04-05 DIAGNOSIS — A0472 Enterocolitis due to Clostridium difficile, not specified as recurrent: Secondary | ICD-10-CM | POA: Diagnosis present

## 2017-04-05 DIAGNOSIS — R627 Adult failure to thrive: Secondary | ICD-10-CM | POA: Diagnosis present

## 2017-04-05 DIAGNOSIS — G9340 Encephalopathy, unspecified: Secondary | ICD-10-CM | POA: Diagnosis present

## 2017-04-05 DIAGNOSIS — N390 Urinary tract infection, site not specified: Secondary | ICD-10-CM

## 2017-04-05 DIAGNOSIS — D631 Anemia in chronic kidney disease: Secondary | ICD-10-CM | POA: Diagnosis present

## 2017-04-05 DIAGNOSIS — Z9181 History of falling: Secondary | ICD-10-CM

## 2017-04-05 DIAGNOSIS — Z6839 Body mass index (BMI) 39.0-39.9, adult: Secondary | ICD-10-CM

## 2017-04-05 DIAGNOSIS — F419 Anxiety disorder, unspecified: Secondary | ICD-10-CM | POA: Diagnosis present

## 2017-04-05 DIAGNOSIS — R410 Disorientation, unspecified: Secondary | ICD-10-CM

## 2017-04-05 DIAGNOSIS — M109 Gout, unspecified: Secondary | ICD-10-CM | POA: Diagnosis present

## 2017-04-05 DIAGNOSIS — Z7951 Long term (current) use of inhaled steroids: Secondary | ICD-10-CM

## 2017-04-05 DIAGNOSIS — Z888 Allergy status to other drugs, medicaments and biological substances status: Secondary | ICD-10-CM

## 2017-04-05 DIAGNOSIS — J449 Chronic obstructive pulmonary disease, unspecified: Secondary | ICD-10-CM | POA: Diagnosis present

## 2017-04-05 DIAGNOSIS — Z79899 Other long term (current) drug therapy: Secondary | ICD-10-CM

## 2017-04-05 DIAGNOSIS — E1122 Type 2 diabetes mellitus with diabetic chronic kidney disease: Secondary | ICD-10-CM | POA: Diagnosis present

## 2017-04-05 DIAGNOSIS — E039 Hypothyroidism, unspecified: Secondary | ICD-10-CM | POA: Diagnosis present

## 2017-04-05 DIAGNOSIS — R4182 Altered mental status, unspecified: Secondary | ICD-10-CM

## 2017-04-05 DIAGNOSIS — Z66 Do not resuscitate: Secondary | ICD-10-CM | POA: Diagnosis present

## 2017-04-05 DIAGNOSIS — Z794 Long term (current) use of insulin: Secondary | ICD-10-CM

## 2017-04-05 DIAGNOSIS — F329 Major depressive disorder, single episode, unspecified: Secondary | ICD-10-CM | POA: Diagnosis present

## 2017-04-05 DIAGNOSIS — G9341 Metabolic encephalopathy: Secondary | ICD-10-CM | POA: Diagnosis present

## 2017-04-05 LAB — URINALYSIS, ROUTINE W REFLEX MICROSCOPIC
Bilirubin Urine: NEGATIVE
Glucose, UA: NEGATIVE mg/dL
Ketones, ur: NEGATIVE mg/dL
Nitrite: NEGATIVE
PH: 5 (ref 5.0–8.0)
Protein, ur: 30 mg/dL — AB
SPECIFIC GRAVITY, URINE: 1.012 (ref 1.005–1.030)

## 2017-04-05 LAB — COMPREHENSIVE METABOLIC PANEL
ALBUMIN: 2.5 g/dL — AB (ref 3.5–5.0)
ALK PHOS: 187 U/L — AB (ref 38–126)
ALT: 38 U/L (ref 14–54)
AST: 81 U/L — AB (ref 15–41)
Anion gap: 8 (ref 5–15)
BILIRUBIN TOTAL: 1.7 mg/dL — AB (ref 0.3–1.2)
BUN: 9 mg/dL (ref 6–20)
CO2: 22 mmol/L (ref 22–32)
Calcium: 7.7 mg/dL — ABNORMAL LOW (ref 8.9–10.3)
Chloride: 105 mmol/L (ref 101–111)
Creatinine, Ser: 3.29 mg/dL — ABNORMAL HIGH (ref 0.44–1.00)
GFR calc Af Amer: 15 mL/min — ABNORMAL LOW (ref >60)
GFR calc non Af Amer: 13 mL/min — ABNORMAL LOW (ref >60)
GLUCOSE: 101 mg/dL — AB (ref 65–99)
POTASSIUM: 5.5 mmol/L — AB (ref 3.5–5.1)
Sodium: 135 mmol/L (ref 135–145)
TOTAL PROTEIN: 5.3 g/dL — AB (ref 6.5–8.1)

## 2017-04-05 LAB — BRAIN NATRIURETIC PEPTIDE: B Natriuretic Peptide: 157 pg/mL — ABNORMAL HIGH (ref 0.0–100.0)

## 2017-04-05 LAB — CBC WITH DIFFERENTIAL/PLATELET
Basophils Absolute: 0 10*3/uL (ref 0.0–0.1)
Basophils Relative: 0 %
EOS PCT: 3 %
Eosinophils Absolute: 0.2 10*3/uL (ref 0.0–0.7)
HCT: 25.9 % — ABNORMAL LOW (ref 36.0–46.0)
HEMOGLOBIN: 8.7 g/dL — AB (ref 12.0–15.0)
LYMPHS ABS: 1.7 10*3/uL (ref 0.7–4.0)
LYMPHS PCT: 21 %
MCH: 32.6 pg (ref 26.0–34.0)
MCHC: 33.6 g/dL (ref 30.0–36.0)
MCV: 97 fL (ref 78.0–100.0)
Monocytes Absolute: 1.1 10*3/uL — ABNORMAL HIGH (ref 0.1–1.0)
Monocytes Relative: 14 %
Neutro Abs: 4.9 10*3/uL (ref 1.7–7.7)
Neutrophils Relative %: 62 %
PLATELETS: 255 10*3/uL (ref 150–400)
RBC: 2.67 MIL/uL — AB (ref 3.87–5.11)
RDW: 16.6 % — ABNORMAL HIGH (ref 11.5–15.5)
WBC: 7.9 10*3/uL (ref 4.0–10.5)

## 2017-04-05 LAB — LIPASE, BLOOD: Lipase: 16 U/L (ref 11–51)

## 2017-04-05 LAB — CBG MONITORING, ED: Glucose-Capillary: 96 mg/dL (ref 65–99)

## 2017-04-05 LAB — LACTIC ACID, PLASMA: Lactic Acid, Venous: 1.5 mmol/L (ref 0.5–1.9)

## 2017-04-05 LAB — TROPONIN I: Troponin I: <0.03 ng/mL (ref <0.03)

## 2017-04-05 MED ORDER — LEVOTHYROXINE SODIUM 112 MCG PO TABS
137.0000 ug | ORAL_TABLET | Freq: Every day | ORAL | Status: DC
  Administered 2017-04-06 – 2017-04-19 (×13): 137 ug via ORAL
  Filled 2017-04-05 (×13): qty 1

## 2017-04-05 MED ORDER — SODIUM CHLORIDE 0.9 % IV SOLN
1.0000 g | Freq: Once | INTRAVENOUS | Status: AC
  Administered 2017-04-06: 1 g via INTRAVENOUS
  Filled 2017-04-05: qty 1

## 2017-04-05 MED ORDER — GABAPENTIN 100 MG PO CAPS
200.0000 mg | ORAL_CAPSULE | Freq: Two times a day (BID) | ORAL | Status: DC
  Administered 2017-04-06 – 2017-04-07 (×5): 200 mg via ORAL
  Filled 2017-04-05 (×5): qty 2

## 2017-04-05 MED ORDER — DILTIAZEM HCL ER COATED BEADS 120 MG PO CP24
120.0000 mg | ORAL_CAPSULE | Freq: Every day | ORAL | Status: DC
  Administered 2017-04-06 – 2017-04-19 (×13): 120 mg via ORAL
  Filled 2017-04-05 (×13): qty 1

## 2017-04-05 MED ORDER — SODIUM CHLORIDE 0.9 % IV BOLUS (SEPSIS)
1000.0000 mL | Freq: Once | INTRAVENOUS | Status: AC
  Administered 2017-04-05: 1000 mL via INTRAVENOUS

## 2017-04-05 MED ORDER — ALBUTEROL SULFATE (2.5 MG/3ML) 0.083% IN NEBU
3.0000 mL | INHALATION_SOLUTION | Freq: Four times a day (QID) | RESPIRATORY_TRACT | Status: DC
  Administered 2017-04-06 – 2017-04-11 (×20): 3 mL via RESPIRATORY_TRACT
  Filled 2017-04-05 (×20): qty 3

## 2017-04-05 MED ORDER — VANCOMYCIN 50 MG/ML ORAL SOLUTION
250.0000 mg | Freq: Two times a day (BID) | ORAL | Status: DC
  Administered 2017-04-06 – 2017-04-12 (×14): 250 mg via ORAL
  Filled 2017-04-05 (×21): qty 5

## 2017-04-05 MED ORDER — FLUTICASONE FUROATE-VILANTEROL 200-25 MCG/INH IN AEPB
1.0000 | INHALATION_SPRAY | Freq: Every day | RESPIRATORY_TRACT | Status: DC
  Administered 2017-04-06 – 2017-04-19 (×12): 1 via RESPIRATORY_TRACT
  Filled 2017-04-05 (×3): qty 28

## 2017-04-05 MED ORDER — ACETAMINOPHEN 325 MG PO TABS
650.0000 mg | ORAL_TABLET | Freq: Four times a day (QID) | ORAL | Status: DC | PRN
  Administered 2017-04-10 – 2017-04-19 (×6): 650 mg via ORAL

## 2017-04-05 MED ORDER — INSULIN ASPART 100 UNIT/ML ~~LOC~~ SOLN
0.0000 [IU] | Freq: Three times a day (TID) | SUBCUTANEOUS | Status: DC
  Administered 2017-04-10 – 2017-04-18 (×3): 2 [IU] via SUBCUTANEOUS

## 2017-04-05 MED ORDER — PANTOPRAZOLE SODIUM 40 MG PO TBEC
40.0000 mg | DELAYED_RELEASE_TABLET | Freq: Every day | ORAL | Status: DC
  Administered 2017-04-06 – 2017-04-09 (×3): 40 mg via ORAL
  Filled 2017-04-05 (×3): qty 1

## 2017-04-05 MED ORDER — APIXABAN 5 MG PO TABS
5.0000 mg | ORAL_TABLET | Freq: Two times a day (BID) | ORAL | Status: DC
  Administered 2017-04-06 – 2017-04-19 (×27): 5 mg via ORAL
  Filled 2017-04-05 (×27): qty 1

## 2017-04-05 MED ORDER — METOPROLOL TARTRATE 12.5 MG HALF TABLET
12.5000 mg | ORAL_TABLET | Freq: Two times a day (BID) | ORAL | Status: DC
  Administered 2017-04-06 – 2017-04-19 (×27): 12.5 mg via ORAL
  Filled 2017-04-05 (×28): qty 1

## 2017-04-05 MED ORDER — CALCIUM CARBONATE ANTACID 500 MG PO CHEW
1.0000 | CHEWABLE_TABLET | Freq: Two times a day (BID) | ORAL | Status: DC
  Administered 2017-04-06 – 2017-04-19 (×27): 200 mg via ORAL
  Filled 2017-04-05 (×27): qty 1

## 2017-04-05 MED ORDER — ACETAMINOPHEN 325 MG PO TABS
650.0000 mg | ORAL_TABLET | Freq: Four times a day (QID) | ORAL | Status: DC | PRN
  Administered 2017-04-18: 650 mg via ORAL
  Filled 2017-04-05 (×7): qty 2

## 2017-04-05 MED ORDER — SODIUM BICARBONATE 650 MG PO TABS
650.0000 mg | ORAL_TABLET | Freq: Two times a day (BID) | ORAL | Status: DC
  Administered 2017-04-06 – 2017-04-19 (×26): 650 mg via ORAL
  Filled 2017-04-05 (×28): qty 1

## 2017-04-05 MED ORDER — ACETAMINOPHEN 650 MG RE SUPP: 650.0000 mg | Freq: Four times a day (QID) | RECTAL | Status: DC | PRN

## 2017-04-05 MED ORDER — INSULIN ASPART 100 UNIT/ML ~~LOC~~ SOLN: 0.0000 [IU] | Freq: Every day | SUBCUTANEOUS | Status: DC

## 2017-04-05 MED ORDER — SODIUM CHLORIDE 0.9 % IV SOLN
INTRAVENOUS | Status: DC
  Administered 2017-04-05 – 2017-04-08 (×6): via INTRAVENOUS

## 2017-04-05 NOTE — ED Provider Notes (Signed)
[facility] DE[facility] Provider Note   CSN: [id number] Arrival date & time: 04/05/17  1915     History   Chief Complaint Chief Complaint  Patient presents with  . Altered Mental Status    HPI Yolanda [name] is a 76 y.o. female.  The history is provided by the patient and the nursing home. The history is limited by the condition of the patient (AMS).  Altered Mental Status    Pt was seen at 1925. Per NH report: Pt with AMS today. NH states pt was "talking out of her head." Pt currently being tx with PO vancomycin for cdiff for the past 5 days. Pt states she has had multiple episodes of N/V/D, but otherwise does not know why she was sent to the ED from the NH. No reported fevers, no reported black or blood in stools or emesis, no reported focal motor weakness. Pt denies CP/SOB/abd pain.   Past Medical History:  Diagnosis Date  . Barrett esophagus   . Bronchitis   . C. difficile colitis   . Cellulitis   . Cellulitis   . CHF (congestive heart failure) (HCC)   . COPD (chronic obstructive pulmonary disease) (HCC)   . Diabetes mellitus without complication (HCC)   . Diastolic heart failure (HCC)   . Difficulty walking   . Dysphagia   . Essential hypertension   . Gastritis   . GERD (gastroesophageal reflux disease)   . Gout   . Hepatomegaly    ???  . History of falling   . Hyponatremia   . Hypothyroidism   . Iron deficiency anemia   . Muscle weakness   . PAF (paroxysmal atrial fibrillation) (HCC)    Eliquis  . Sleep apnea   . Stage III chronic kidney disease     Patient Active Problem List   Diagnosis Date Noted  . Diarrhea 03/12/2017  . Schatzki's ring 02/06/2017  . Gastritis 02/06/2017  . Esophageal dysphagia   . Elevated LFTs 01/28/2017  . Diabetes mellitus type 2 in obese (HCC) 01/28/2017  . Metabolic encephalopathy 01/01/2017  . Acute encephalopathy 12/30/2016  . Acute lower UTI 12/30/2016  . Pain, dental 12/30/2016  . Spasm of back muscles 12/30/2016  .  Hyperglycemia 12/30/2016  . Gout attack 09/07/2016  . Chronic anticoagulation 02/02/2016  . Chronic edema 02/02/2016  . Obesity 02/02/2016  . Sleep apnea 02/02/2016  . RBBB 02/02/2016  . Acute on chronic diastolic heart failure (HCC) 01/13/2016  . Hyponatremia 01/06/2016  . AKI (acute kidney injury) (HCC) 01/06/2016  . Cellulitis of left lower extremity 12/15/2014  . PAF (paroxysmal atrial fibrillation) (HCC) 11/09/2014  . COPD (chronic obstructive pulmonary disease) (HCC) 11/06/2014  . Neuropathic pain 11/06/2014  . Hypothyroidism 11/06/2014  . Hyperkalemia 11/06/2014  . HTN (hypertension) 01/12/2014  . Upper airway cough syndrome 09/03/2013  . GERD (gastroesophageal reflux disease) 11/13/2011  . Short-segment Barrett's esophagus 11/13/2011    Past Surgical History:  Procedure Laterality Date  . BREAST BIOPSY Right Sept, 2012  . CHOLECYSTECTOMY    . COLONOSCOPY    . ESOPHAGEAL DILATION N/A 02/05/2017   Procedure: ESOPHAGEAL DILATION;  Surgeon: Malissa Hippo, MD;  Location: [facility];  Service: Endoscopy;  Laterality: N/A;  . ESOPHAGOGASTRODUODENOSCOPY N/A 02/05/2017   Procedure: ESOPHAGOGASTRODUODENOSCOPY (EGD);  Surgeon: Malissa Hippo, MD;  Location: [facility];  Service: Endoscopy;  Laterality: N/A;  . UPPER GASTROINTESTINAL ENDOSCOPY      OB History    Gravida Para Term Preterm AB Living  0   SAB TAB Ectopic Multiple Live Births                   Home Medications    Prior to Admission medications   Medication Sig Start Date End Date Taking? Authorizing Provider  acetaminophen (TYLENOL) 325 MG tablet Take 2 tablets (650 mg total) by mouth every 6 (six) hours as needed for mild pain (or Fever >/= 101). 01/07/17   Kari Baars, MD  albuterol (PROVENTIL HFA;VENTOLIN HFA) 108 (90 Base) MCG/ACT inhaler Inhale 2 puffs into the lungs 4 (four) times daily.     [provider]  allopurinol (ZYLOPRIM) 300 MG tablet Take 0.5 tablets (150 mg  total) by mouth daily. 02/07/17   Kari Baars, MD  ALPRAZolam Prudy Feeler) 0.25 MG tablet Take 1 tablet (0.25 mg total) by mouth 2 (two) times daily as needed for anxiety. 02/06/17 02/06/18  Kari Baars, MD  apixaban (ELIQUIS) 5 MG TABS tablet Take 1 tablet (5 mg total) by mouth 2 (two) times daily. 11/13/14   Kari Baars, MD  [facility] [address] [facility]) OINT Apply 1 application topically See admin instructions. Apply to sacrum and bilateral buttocks every shift and as needed.    [provider]  calcium carbonate (TUMS - DOSED IN MG ELEMENTAL CALCIUM) 500 MG chewable tablet Chew 1 tablet by mouth 2 (two) times daily.    [provider]  cetirizine (ZYRTEC) 10 MG tablet Take 10 mg by mouth daily.    [provider]  cholecalciferol (VITAMIN D) 1000 units tablet Take 1,000 Units by mouth every morning.    [provider]  colchicine 0.6 MG tablet Take 1 tablet (0.6 mg total) by mouth daily. 03/20/17   Kari Baars, MD  diltiazem (CARDIZEM CD) 120 MG 24 hr capsule Take 1 capsule (120 mg total) by mouth daily. 01/26/16   Laqueta Linden, MD  Doxepin HCl (SILENOR) 6 MG TABS Take 1 tablet (6 mg total) by mouth at bedtime as needed. 02/06/17   Kari Baars, MD  escitalopram (LEXAPRO) 20 MG tablet Take 1 tablet (20 mg total) by mouth daily. 03/20/17   Kari Baars, MD  feeding supplement, GLUCERNA SHAKE, (GLUCERNA SHAKE) LIQD Take 237 mLs by mouth 3 (three) times daily between meals. 01/07/17   Kari Baars, MD  fluticasone furoate-vilanterol (BREO ELLIPTA) 200-25 MCG/INH AEPB Inhale 1 puff into the lungs daily.    [provider]  gabapentin (NEURONTIN) 100 MG capsule Take 100 mg by mouth 2 (two) times daily.    [provider]  HYDROcodone-acetaminophen (NORCO) 7.5-325 MG tablet Take 1 tablet by mouth 3 (three) times daily as needed for moderate pain.    [provider]  insulin glargine (LANTUS) 100 UNIT/ML injection Inject  0.2 mLs (20 Units total) into the skin daily. 01/07/17   Kari Baars, MD  insulin lispro (HUMALOG) 100 UNIT/ML injection Inject 3-20 Units into the skin 3 (three) times daily before meals. 121-150=1 units 151-200=2 units 201-250=3 units 251-300=5units 301-350=7units 351-400=9units    [provider]  levothyroxine (SYNTHROID, LEVOTHROID) 137 MCG tablet Take 137 mcg by mouth daily before breakfast.    [provider]  loperamide (IMODIUM) 2 MG capsule Take 1 capsule (2 mg total) by mouth every morning. 04/02/17   Malissa Hippo, MD  metoprolol tartrate (LOPRESSOR) 25 MG tablet TAKE 1/2 TABLET BY MOUTH TWICE DAILY 10/23/16   Laqueta Linden, MD  Multiple Vitamin (MULTIVITAMIN WITH MINERALS) TABS tablet Take 1 tablet by mouth daily.  [provider]  omeprazole (PRILOSEC) 20 MG capsule TAKE ONE CAPSULE BY MOUTH DAILY 05/29/16   Rehman, Joline MaxcyNajeeb U, MD  ondansetron (ZOFRAN-ODT) 4 MG disintegrating tablet Take 1 tablet (4 mg total) by mouth 3 (three) times daily before meals. 03/20/17   Kari BaarsHawkins, Edward, MD  Pediatric Multivitamins-Iron (FLINTSTONES PLUS IRON) chewable tablet Chew 1 tablet by mouth 2 (two) times daily. 04/02/17   Rehman, Joline MaxcyNajeeb U, MD  polyvinyl alcohol (LIQUIFILM TEARS) 1.4 % ophthalmic solution Place 1 drop into both eyes 4 (four) times daily as needed for dry eyes. 01/07/17   Kari BaarsHawkins, Edward, MD  potassium chloride SA (K-DUR,KLOR-CON) 20 MEQ tablet Take 1 tablet (20 mEq total) by mouth daily. 12/10/16   Antoine PocheBranch, Jonathan F, MD  sodium bicarbonate 650 MG tablet Take 1 tablet (650 mg total) by mouth 2 (two) times daily. 03/20/17   Kari BaarsHawkins, Edward, MD  triamcinolone (NASACORT ALLERGY 24HR) 55 MCG/ACT AERO nasal inhaler Place 2 sprays into the nose daily.    [provider]  vancomycin (VANCOCIN) 50 mg/mL oral solution Take 5 mLs (250 mg total) by mouth every 6 (six) hours. 03/20/17   Kari BaarsHawkins, Edward, MD    Family History Family History  Problem  Relation Age of Onset  . Dementia Mother   . Lung cancer Father        smoked  . Hypertension Sister   . Diabetes Brother   . Obesity Sister   . Hypertension Sister   . Restless legs syndrome Sister   . Healthy Sister   . Lung cancer Maternal Uncle     Social History Social History  Substance Use Topics  . Smoking status: Former Smoker    Packs/day: 0.50    Years: 30.00    Types: Cigarettes    Start date: 09/24/1958    Quit date: 11/13/1999  . Smokeless tobacco: Never Used  . Alcohol use No     Allergies   Ciprofloxacin; Doxycycline; Fish oil; Guaifenesin; Mucinex [guaifenesin er]; Sulfamethoxazole w/trimethoprim 800-160 [sulfamethoxazole-trimethoprim]; Uloric [febuxostat]; Adhesive [tape]; Allopurinol; Cefuroxime axetil; Metolazone; and Tramadol   Review of Systems Review of Systems  Unable to perform ROS: Mental status change     Physical Exam Updated Vital Signs BP (!) 115/33   Pulse 93   Temp 98.6 F (37 C) (Rectal)   Resp (!) 21   SpO2 95%   Physical Exam 1930: Physical examination:  Nursing notes reviewed; Vital signs and O2 SAT reviewed;  Constitutional: Well developed, Well nourished, In no acute distress; Head:  Normocephalic, atraumatic; Eyes: EOMI, PERRL, No scleral icterus; ENMT: Mouth and pharynx normal, Mucous membranes dry; Neck: Supple, Full range of motion, No lymphadenopathy; Cardiovascular: Regular rate and rhythm, No gallop; Respiratory: Breath sounds clear & equal bilaterally, No wheezes.  Speaking full sentences with ease, Normal respiratory effort/excursion; Chest: Nontender, Movement normal; Abdomen: Soft, Nontender, Nondistended, Normal bowel sounds; Genitourinary: No CVA tenderness; Extremities: Pulses normal, No tenderness, No edema, No calf edema or asymmetry.; Neuro: Awake, alert, mildly confused re: events, Major CN grossly intact.  Speech clear. No gross focal motor deficits in extremities.; Skin: Color normal, Warm, Dry.   ED Treatments  / Results  Labs (all labs ordered are listed, but only abnormal results are displayed)   EKG  EKG Interpretation  Date/Time:  Friday April 05 2017 19:30:49 EDT Ventricular Rate:  88 PR Interval:    QRS Duration: 113 QT Interval:  375 QTC Calculation: 454 R Axis:   -5 Text Interpretation:  Sinus rhythm Right bundle  branch block Low voltage, extremity and precordial leads Artifact When compared with ECG of 01/28/2017 Artifact is now Present Otherwise no significant change Confirmed by [facility]  MD, Nicholos Johns [phone]) on 04/05/2017 8:03:33 PM       Radiology   Procedures Procedures (including critical care time)  Medications Ordered in ED Medications - No data to display   Initial Impression / Assessment and Plan / ED Course  I have reviewed the triage vital signs and the nursing notes.  Pertinent labs & imaging results that were available during my care of the patient were reviewed by me and considered in my medical decision making (see chart for details).  MDM Reviewed: previous chart, nursing note and vitals Reviewed previous: labs and ECG Interpretation: labs, ECG, x-ray and CT scan   Results for orders placed or performed during the hospital encounter of 04/05/17  Urinalysis, Routine w reflex microscopic  Result Value Ref Range   Color, Urine AMBER (A) YELLOW   APPearance TURBID (A) CLEAR   Specific Gravity, Urine 1.012 1.005 - 1.030   pH 5.0 5.0 - 8.0   Glucose, UA NEGATIVE NEGATIVE mg/dL   Hgb urine dipstick SMALL (A) NEGATIVE   Bilirubin Urine NEGATIVE NEGATIVE   Ketones, ur NEGATIVE NEGATIVE mg/dL   Protein, ur 30 (A) NEGATIVE mg/dL   Nitrite NEGATIVE NEGATIVE   Leukocytes, UA LARGE (A) NEGATIVE   RBC / HPF 6-30 0 - 5 RBC/hpf   WBC, UA TOO NUMEROUS TO COUNT 0 - 5 WBC/hpf   Bacteria, UA FEW (A) NONE SEEN   Squamous Epithelial / LPF 0-5 (A) NONE SEEN   WBC Clumps PRESENT   Comprehensive metabolic panel  Result Value Ref Range   Sodium 135 135 - 145 mmol/L    Potassium 5.5 (H) 3.5 - 5.1 mmol/L   Chloride 105 101 - 111 mmol/L   CO2 22 22 - 32 mmol/L   Glucose, Bld 101 (H) 65 - 99 mg/dL   BUN 9 6 - 20 mg/dL   Creatinine, Ser 0.98 (H) 0.44 - 1.00 mg/dL   Calcium 7.7 (L) 8.9 - 10.3 mg/dL   Total Protein 5.3 (L) 6.5 - 8.1 g/dL   Albumin 2.5 (L) 3.5 - 5.0 g/dL   AST 81 (H) 15 - 41 U/L   ALT 38 14 - 54 U/L   Alkaline Phosphatase 187 (H) 38 - 126 U/L   Total Bilirubin 1.7 (H) 0.3 - 1.2 mg/dL   GFR calc non Af Amer 13 (L) >60 mL/min   GFR calc Af Amer 15 (L) >60 mL/min   Anion gap 8 5 - 15  Lipase, blood  Result Value Ref Range   Lipase 16 11 - 51 U/L  Troponin I  Result Value Ref Range   Troponin I <0.03 <0.03 ng/mL  Lactic acid, plasma  Result Value Ref Range   Lactic Acid, Venous 1.5 0.5 - 1.9 mmol/L  CBC with Differential  Result Value Ref Range   WBC 7.9 4.0 - 10.5 K/uL   RBC 2.67 (L) 3.87 - 5.11 MIL/uL   Hemoglobin 8.7 (L) 12.0 - 15.0 g/dL   HCT 11.9 (L) 14.7 - 82.9 %   MCV 97.0 78.0 - 100.0 fL   MCH 32.6 26.0 - 34.0 pg   MCHC 33.6 30.0 - 36.0 g/dL   RDW 56.2 (H) 13.0 - 86.5 %   Platelets 255 150 - 400 K/uL   Neutrophils Relative % 62 %   Neutro Abs 4.9 1.7 - 7.7 K/uL  Lymphocytes Relative 21 %   Lymphs Abs 1.7 0.7 - 4.0 K/uL   Monocytes Relative 14 %   Monocytes Absolute 1.1 (H) 0.1 - 1.0 K/uL   Eosinophils Relative 3 %   Eosinophils Absolute 0.2 0.0 - 0.7 K/uL   Basophils Relative 0 %   Basophils Absolute 0.0 0.0 - 0.1 K/uL  Brain natriuretic peptide  Result Value Ref Range   B Natriuretic Peptide 157.0 (H) 0.0 - 100.0 pg/mL  CBG monitoring, ED  Result Value Ref Range   Glucose-Capillary 96 65 - 99 mg/dL   Ct Head Wo Contrast Result Date: 04/05/2017 CLINICAL DATA:  Altered mental status with hypoxia EXAM: CT HEAD WITHOUT CONTRAST TECHNIQUE: Contiguous axial images were obtained from the base of the skull through the vertex without intravenous contrast. COMPARISON:  01/28/2017 FINDINGS: Brain: No acute territorial  infarction, hemorrhage or intracranial mass is seen. Atrophy and mild small vessel ischemic changes of the white matter. Tiny lacunar infarcts, old in the bilateral basal ganglia. Nonenlarged ventricles. Vascular: No hyperdense vessels.  Carotid artery calcifications. Skull: Normal. Negative for fracture or focal lesion. Sinuses/Orbits: No acute finding. Other: None IMPRESSION: No definite CT evidence for acute intracranial abnormality. Electronically Signed   By: Jasmine Pang M.D.   On: 04/05/2017 20:53   Dg Abd Acute W/chest Result Date: 04/05/2017 CLINICAL DATA:  Altered mental status, being treated for C. difficile infection with vancomycin for 5 days, history CHF, hypertension, diabetes mellitus, COPD EXAM: DG ABDOMEN ACUTE W/ 1V CHEST COMPARISON:  02/10/2017 FINDINGS: Borderline enlargement of cardiac silhouette. Mediastinal contours and pulmonary vascularity normal. Atherosclerotic calcification aortic arch. Linear subsegmental atelectasis at medial LEFT lower lobe. Lungs otherwise clear. No pleural effusion or pneumothorax. Air-filled transverse colon, nondistended. Surgical clips RIGHT upper quadrant likely cholecystectomy. No bowel dilatation, bowel wall thickening, or free air. Bones diffusely demineralized. No definite urinary tract calcification. IMPRESSION: Linear subsegmental atelectasis at medial LEFT lower lobe. No acute abdominal findings. Electronically Signed   By: Ulyses Southward M.D.   On: 04/05/2017 21:00   Results for HUNTER, PINKARD (MRN [id number]) as of 04/05/2017 21:38  Ref. Range 03/18/2017 09:04 03/20/2017 04:58 03/22/2017 10:00 03/25/2017 04:10 03/29/2017 07:30 04/01/2017 08:30 04/04/2017 11:20 04/05/2017 19:50  BUN Latest Ref Range: 6 - 20 mg/dL 21 (H) 14 11 9 7 6 7 9   Creatinine Latest Ref Range: 0.44 - 1.00 mg/dL 6.57 (H) 8.46 (H) 9.62 (H) 1.20 (H) 1.72 (H) 1.79 (H) 2.81 (H) 3.29 (H)    2130:  BUN/Cr increasing steadily over the past 2 weeks, nearly doubling over the past 4 days.  +UTI,  UC pending; will dose IV abx.  T/C to [facility]. Conley Rolls, case discussed, including:  HPI, pertinent PM/SHx, VS/PE, dx testing, ED course and treatment:  Agreeable to admit. ,    Final Clinical Impressions(s) / ED Diagnoses   Final diagnoses:  None    New Prescriptions New Prescriptions   No medications on file      Samuel Jester, DO 04/10/17 0745

## 2017-04-05 NOTE — ED Notes (Signed)
admitting Provider at bedside. 

## 2017-04-05 NOTE — ED Triage Notes (Signed)
Pt from the [facility] via stretcher with reported altered mental status today.  Pt being treated for cdiff with vancomycin for the past 5 days.  Pt is currently awake, alert, oriented.

## 2017-04-05 NOTE — ED Notes (Signed)
Patient transported to CT 

## 2017-04-05 NOTE — Progress Notes (Signed)
Pharmacy Note:  Initial antibiotics for MEROPENEM ordered for UTI.  Estimated Creatinine Clearance: 15.2 mL/min (A) (by C-G formula based on SCr of 3.29 mg/dL (H)).   Allergies  Allergen Reactions  . Ciprofloxacin Shortness Of Breath    REACTION: sob,tachycardia  . Doxycycline Nausea Only    Also experienced diarrhea   . Fish Oil     Patient face drew to the side,Bells Palsey  . Guaifenesin Shortness Of Breath    REACTION: sob,tachycardia  . Mucinex [Guaifenesin Er] Shortness Of Breath  . Sulfamethoxazole W/Trimethoprim 800-160 [Sulfamethoxazole-Trimethoprim] Nausea Only    Also lack of appetite   . Uloric [Febuxostat] Swelling    No urination  . Adhesive [Tape] Other (See Comments)    Causes blisters on skin  . Allopurinol Other (See Comments)    Couldn't urinate  . Cefuroxime Axetil Swelling    Swelling all over body-per patient she was hospitalized as a result of taking this medication  . Metolazone Nausea Only    Swelling   . Tramadol     insomnia    Vitals:   04/05/17 2045 04/05/17 2100  BP: (!) 112/51 (!) 114/58  Pulse: 90 90  Resp: 18 19  Temp:      Anti-infectives    Start     Dose/Rate Route Frequency Ordered Stop   04/05/17 2245  meropenem (MERREM) 1 g in sodium chloride 0.9 % 100 mL IVPB     1 g 200 mL/hr over 30 Minutes Intravenous  Once 04/05/17 2207       Plan: Initial doses of Meropenem 1gm X 1 ordered. F/U admission orders for further dosing if therapy continued.  Wayland DenisHall, Ein Rijo A, [facility] Surgery CenterRPH 04/05/2017 10:08 PM

## 2017-04-06 DIAGNOSIS — E1121 Type 2 diabetes mellitus with diabetic nephropathy: Secondary | ICD-10-CM | POA: Diagnosis present

## 2017-04-06 DIAGNOSIS — Z66 Do not resuscitate: Secondary | ICD-10-CM | POA: Diagnosis present

## 2017-04-06 DIAGNOSIS — I130 Hypertensive heart and chronic kidney disease with heart failure and stage 1 through stage 4 chronic kidney disease, or unspecified chronic kidney disease: Secondary | ICD-10-CM | POA: Diagnosis present

## 2017-04-06 DIAGNOSIS — E039 Hypothyroidism, unspecified: Secondary | ICD-10-CM | POA: Diagnosis present

## 2017-04-06 DIAGNOSIS — Z9981 Dependence on supplemental oxygen: Secondary | ICD-10-CM | POA: Diagnosis not present

## 2017-04-06 DIAGNOSIS — E1169 Type 2 diabetes mellitus with other specified complication: Secondary | ICD-10-CM | POA: Diagnosis not present

## 2017-04-06 DIAGNOSIS — Z7901 Long term (current) use of anticoagulants: Secondary | ICD-10-CM | POA: Diagnosis not present

## 2017-04-06 DIAGNOSIS — D631 Anemia in chronic kidney disease: Secondary | ICD-10-CM | POA: Diagnosis present

## 2017-04-06 DIAGNOSIS — I5032 Chronic diastolic (congestive) heart failure: Secondary | ICD-10-CM | POA: Diagnosis present

## 2017-04-06 DIAGNOSIS — G9341 Metabolic encephalopathy: Secondary | ICD-10-CM | POA: Diagnosis present

## 2017-04-06 DIAGNOSIS — R410 Disorientation, unspecified: Secondary | ICD-10-CM | POA: Diagnosis not present

## 2017-04-06 DIAGNOSIS — G4730 Sleep apnea, unspecified: Secondary | ICD-10-CM | POA: Diagnosis present

## 2017-04-06 DIAGNOSIS — E870 Hyperosmolality and hypernatremia: Secondary | ICD-10-CM | POA: Diagnosis present

## 2017-04-06 DIAGNOSIS — E038 Other specified hypothyroidism: Secondary | ICD-10-CM | POA: Diagnosis not present

## 2017-04-06 DIAGNOSIS — N390 Urinary tract infection, site not specified: Secondary | ICD-10-CM | POA: Diagnosis present

## 2017-04-06 DIAGNOSIS — I480 Paroxysmal atrial fibrillation: Secondary | ICD-10-CM | POA: Diagnosis present

## 2017-04-06 DIAGNOSIS — R4182 Altered mental status, unspecified: Secondary | ICD-10-CM

## 2017-04-06 DIAGNOSIS — A0472 Enterocolitis due to Clostridium difficile, not specified as recurrent: Secondary | ICD-10-CM | POA: Diagnosis present

## 2017-04-06 DIAGNOSIS — E8770 Fluid overload, unspecified: Secondary | ICD-10-CM | POA: Diagnosis not present

## 2017-04-06 DIAGNOSIS — N184 Chronic kidney disease, stage 4 (severe): Secondary | ICD-10-CM | POA: Diagnosis present

## 2017-04-06 DIAGNOSIS — E669 Obesity, unspecified: Secondary | ICD-10-CM | POA: Diagnosis present

## 2017-04-06 DIAGNOSIS — J449 Chronic obstructive pulmonary disease, unspecified: Secondary | ICD-10-CM | POA: Diagnosis present

## 2017-04-06 DIAGNOSIS — E1122 Type 2 diabetes mellitus with diabetic chronic kidney disease: Secondary | ICD-10-CM | POA: Diagnosis present

## 2017-04-06 DIAGNOSIS — N179 Acute kidney failure, unspecified: Secondary | ICD-10-CM

## 2017-04-06 DIAGNOSIS — G9340 Encephalopathy, unspecified: Secondary | ICD-10-CM | POA: Diagnosis not present

## 2017-04-06 DIAGNOSIS — Z881 Allergy status to other antibiotic agents status: Secondary | ICD-10-CM | POA: Diagnosis not present

## 2017-04-06 DIAGNOSIS — Z794 Long term (current) use of insulin: Secondary | ICD-10-CM | POA: Diagnosis not present

## 2017-04-06 DIAGNOSIS — Z6839 Body mass index (BMI) 39.0-39.9, adult: Secondary | ICD-10-CM | POA: Diagnosis not present

## 2017-04-06 DIAGNOSIS — Z882 Allergy status to sulfonamides status: Secondary | ICD-10-CM | POA: Diagnosis not present

## 2017-04-06 DIAGNOSIS — Z888 Allergy status to other drugs, medicaments and biological substances status: Secondary | ICD-10-CM | POA: Diagnosis not present

## 2017-04-06 LAB — CBC
HCT: 24.4 % — ABNORMAL LOW (ref 36.0–46.0)
HEMOGLOBIN: 8.1 g/dL — AB (ref 12.0–15.0)
MCH: 32.4 pg (ref 26.0–34.0)
MCHC: 33.2 g/dL (ref 30.0–36.0)
MCV: 97.6 fL (ref 78.0–100.0)
Platelets: 262 10*3/uL (ref 150–400)
RBC: 2.5 MIL/uL — ABNORMAL LOW (ref 3.87–5.11)
RDW: 16.7 % — AB (ref 11.5–15.5)
WBC: 7.5 10*3/uL (ref 4.0–10.5)

## 2017-04-06 LAB — GLUCOSE, CAPILLARY
GLUCOSE-CAPILLARY: 84 mg/dL (ref 65–99)
GLUCOSE-CAPILLARY: 82 mg/dL (ref 65–99)
GLUCOSE-CAPILLARY: 129 mg/dL — AB (ref 65–99)
Glucose-Capillary: 93 mg/dL (ref 65–99)

## 2017-04-06 LAB — BASIC METABOLIC PANEL
Anion gap: 7 (ref 5–15)
BUN: 9 mg/dL (ref 6–20)
CALCIUM: 7.3 mg/dL — AB (ref 8.9–10.3)
CO2: 22 mmol/L (ref 22–32)
Chloride: 109 mmol/L (ref 101–111)
Creatinine, Ser: 3.06 mg/dL — ABNORMAL HIGH (ref 0.44–1.00)
GFR calc Af Amer: 16 mL/min — ABNORMAL LOW (ref >60)
GFR calc non Af Amer: 14 mL/min — ABNORMAL LOW (ref >60)
Glucose, Bld: 84 mg/dL (ref 65–99)
Potassium: 5.1 mmol/L (ref 3.5–5.1)
SODIUM: 138 mmol/L (ref 135–145)

## 2017-04-06 LAB — TSH: TSH: 3.893 u[IU]/mL (ref 0.350–4.500)

## 2017-04-06 MED ORDER — ENSURE ENLIVE PO LIQD
237.0000 mL | Freq: Two times a day (BID) | ORAL | Status: DC
  Administered 2017-04-06: 237 mL via ORAL

## 2017-04-06 MED ORDER — GLUCERNA SHAKE PO LIQD
237.0000 mL | Freq: Two times a day (BID) | ORAL | Status: DC
  Administered 2017-04-07 – 2017-04-08 (×2): 237 mL via ORAL

## 2017-04-06 MED ORDER — LORAZEPAM 2 MG/ML IJ SOLN
0.5000 mg | Freq: Once | INTRAMUSCULAR | Status: AC
  Administered 2017-04-06: 0.5 mg via INTRAVENOUS
  Filled 2017-04-06: qty 1

## 2017-04-06 MED ORDER — SODIUM CHLORIDE 0.9 % IV SOLN
1.0000 g | Freq: Two times a day (BID) | INTRAVENOUS | Status: AC
  Administered 2017-04-06 – 2017-04-10 (×9): 1 g via INTRAVENOUS
  Filled 2017-04-06 (×12): qty 1

## 2017-04-06 MED ORDER — HALOPERIDOL LACTATE 5 MG/ML IJ SOLN
1.0000 mg | Freq: Once | INTRAMUSCULAR | Status: AC
  Administered 2017-04-06: 1 mg via INTRAVENOUS
  Filled 2017-04-06: qty 1

## 2017-04-06 MED ORDER — PRO-STAT SUGAR FREE PO LIQD
30.0000 mL | Freq: Two times a day (BID) | ORAL | Status: DC
  Administered 2017-04-06 – 2017-04-19 (×24): 30 mL via ORAL
  Filled 2017-04-06 (×25): qty 30

## 2017-04-06 NOTE — Progress Notes (Signed)
Pharmacy Antibiotic Note  Yolanda [name] is a 76 y.o. female admitted on 04/05/2017 with UTI.  Pharmacy has been consulted for MEROPENEM dosing.  Plan: Meropenem 1gm IV q12hrs (renally adjusted) Monitor labs, progress, c/s  Weight: 199 lb 6.4 oz (90.4 kg)  Temp (24hrs), Avg:98.4 F (36.9 C), Min:98.2 F (36.8 C), Max:98.6 F (37 C)   Recent Labs Lab 04/01/17 0830 04/04/17 1120 04/05/17 1950 04/06/17 0540  WBC 6.5 6.6 7.9 7.5  CREATININE 1.79* 2.81* 3.29* 3.06*  LATICACIDVEN  --   --  1.5  --     Estimated Creatinine Clearance: 16.7 mL/min (A) (by C-G formula based on SCr of 3.06 mg/dL (H)).    Allergies  Allergen Reactions  . Ciprofloxacin Shortness Of Breath    REACTION: sob,tachycardia  . Doxycycline Nausea Only    Also experienced diarrhea   . Fish Oil     Patient face drew to the side,Bells Palsey  . Guaifenesin Shortness Of Breath    REACTION: sob,tachycardia  . Mucinex [Guaifenesin Er] Shortness Of Breath  . Sulfamethoxazole W/Trimethoprim 800-160 [Sulfamethoxazole-Trimethoprim] Nausea Only    Also lack of appetite   . Uloric [Febuxostat] Swelling    No urination  . Adhesive [Tape] Other (See Comments)    Causes blisters on skin  . Allopurinol Other (See Comments)    Couldn't urinate  . Cefuroxime Axetil Swelling    Swelling all over body-per patient she was hospitalized as a result of taking this medication  . Metolazone Nausea Only    Swelling   . Tramadol     insomnia   Antimicrobials this admission: Meropenem  >>  7/13  Dose adjustments this admission:  Microbiology results:  BCx: pending  UCx: pending   Sputum:    MRSA PCR:   Thank you for allowing pharmacy to be a part of this patient's care.  Valrie HartHall, Forest Redwine A 04/06/2017 8:00 AM

## 2017-04-06 NOTE — Progress Notes (Signed)
Initial Nutrition Assessment  DOCUMENTATION CODES:  Obesity unspecified  INTERVENTION:  Glucerna Shake po BID, each supplement provides 220 kcal and 10 grams of protein  Will order 30 mL Prostat BID, each supplement provides 100 kcal and 15 grams of protein.  Monitor PO intake and follow to help establish tolerance  NUTRITION DIAGNOSIS:  Inadequate oral intake related to lethargy/confusion, acute illness, poor appetite as evidenced by meal completion < 50%.  GOAL:  Patient will meet greater than or equal to 90% of their needs  MONITOR: PO intake, Supplement acceptance, Labs, Weight trends, I & O's  REASON FOR ASSESSMENT:  Malnutrition Screening Tool    ASSESSMENT:  76 y/o female PMHx Barretts esophagus, CHF, COPD, DM, HF, HTN, GERD, A fib, persistent Cdiff. Presented to ED due to confusion. Work up showed UTI, dehydration, AKI. Admitted for evaluation/management.  RD is operating remotely, however, pt is known to RD as she is [facility].  Unsure of clinical relevance, but [facility]   Since being admitted, the patient has not had any PO intake. She has a chronically poor appetite and typically eats <50% of her meals at the SNF. She is on a "no added salt/consistent carb" diet order. Her family typically will provide her with nearly all her meals.   She receives vit D, mvi with min, ppii, KCL, sodium bicarb. She receives an HS snack due to DM. Her blood sugars are erratic, but recently have been 75-130. Has had episodes of hypogylcemia in the morning.   Will order Glucerna BID, but Historically, the patient refuses most supplements as she claims they give her severe diarrhea. WIll order Prostat 30 ml TID as this can be presented as MED instead of supplement.   Pt's weight has actually been relatively stable for the last 1.5 months between 191-195 lbs at the SNF. SHe briefly had a gain arround the time of her 6/19 hospital  admission. Her last weight at snf was 7/12: 194 lbs  NFPE: Unable to assess  Meds: Ensure, Insulin, Oral and iv abx, ppi, sodium bicarb, IVF Labs BGs 85-100, Albumin: 2.5, Creat improving. K now WDL, h/h:8.1/24.4   Recent Labs Lab 04/04/17 1120 04/05/17 1950 04/06/17 0540  NA 135 135 138  K 5.4* 5.5* 5.1  CL 106 105 109  CO2 23 22 22   BUN 7 9 9   CREATININE 2.81* 3.29* 3.06*  CALCIUM 7.4* 7.7* 7.3*  GLUCOSE 101* 101* 84   Diet Order:  Diet Carb Modified Fluid consistency: Thin; Room service appropriate? Yes  Skin: Cellulitis to leg,   Last BM:  7/14- diarrhea  Height:  Ht Readings from Last 1 Encounters:  04/02/17 5' 1.5" (1.562 m)   Weight:  Wt Readings from Last 1 Encounters:  04/05/17 199 lb 6.4 oz (90.4 kg)   Wt Readings from Last 10 Encounters:  04/05/17 199 lb 6.4 oz (90.4 kg)  04/02/17 192 lb (87.1 kg)  03/14/17 194 lb 14.2 oz (88.4 kg)  02/10/17 223 lb (101.2 kg)  02/03/17 223 lb 5.2 oz (101.3 kg)  01/05/17 227 lb 8.2 oz (103.2 kg)  12/27/16 210 lb (95.3 kg)  11/07/16 214 lb (97.1 kg)  10/08/16 215 lb (97.5 kg)  09/05/16 218 lb 8 oz (99.1 kg)   Ideal Body Weight:  48.87 kg  BMI:  Body mass index is 37.07 kg/m.  Estimated Nutritional Needs:  Kcal:  1500-1700 kcals (MSJx 1.2 +/- 100) Protein:  64-78 (1.3-1.6 g/kg  ibw) Fluid:  Per MD  EDUCATION NEEDS:  No education needs identified at this time  Christophe Louis RD, LDN, CNSC Clinical Nutrition Pager: [phone] 04/06/2017 3:26 PM

## 2017-04-06 NOTE — Progress Notes (Signed)
Patient still very tearful and fearful of "little girl" attempting to molest her in her bed. Staff and family have attempted to reorient patient, no resolve noted as of yet. Spoke with Dr. Selena BattenKim and he ordered for me to give her a one time dose of Haldol 1 mg in her IV. Will administer medication and continue to monitor.

## 2017-04-06 NOTE — H&P (Addendum)
History and Physical    PENI RUPARD YQM:[id number] DOB: 07-27-1941 DOA: 04/05/2017  PCP: Kari Baars, MD  Patient coming from: SNF    Chief Complaint:   Brought in the ER as patient has intermittent confusion.   HPI: Yolanda [name] is an 76 y.o. female with hx of persistent C diff and has been on oral Vancomycin, recently tested negative, hx of recurrent cellulitis, DM, GERD, hypothyroidism, diastolic CHF, recently started on low dose narcotics, brought to the ER as she was intermittently confused.  Evaluation in the ER showed UA with UTI, and she was volume depleted, with AKI (Cr jumped from 1.8 to 3.3, with K of 5.5.  She was a little hypotensive with SBP of 90's though with little symptoms.  Her head CT was negative, and she was given IVF, and hospitalist was asked to admit her for altered mental status, could be from narcotics, from UTI, or from volume depletion and AKI.   When I saw her, she was alert, and orient, and converse normally.  Her sister and brother also confirmed that she is at her baseline. Record indicates that she is a DNR, and she confirmed to me tonight as well.    ED Course:  See above.  Rewiew of Systems:  Constitutional: Negative for malaise, fever and chills. No significant weight loss or weight gain Eyes: Negative for eye pain, redness and discharge, diplopia, visual changes, or flashes of light. ENMT: Negative for ear pain, hoarseness, nasal congestion, sinus pressure and sore throat. No headaches; tinnitus, drooling, or problem swallowing. Cardiovascular: Negative for chest pain, palpitations, diaphoresis, dyspnea and peripheral edema. ; No orthopnea, PND Respiratory: Negative for cough, hemoptysis, wheezing and stridor. No pleuritic chestpain. Gastrointestinal: Negative for diarrhea, constipation,  melena, blood in stool, hematemesis, jaundice and rectal bleeding.    Genitourinary: Negative for frequency, dysuria, incontinence,flank pain and  hematuria; Musculoskeletal: Negative for back pain and neck pain. Negative for swelling and trauma.;  Skin: . Negative for pruritus, rash, abrasions, bruising and skin lesion.; ulcerations Neuro: Negative for headache, lightheadedness and neck stiffness. Negative for weakness, altered level of consciousness , altered mental status, extremity weakness, burning feet, involuntary movement, seizure and syncope.  Psych: negative for anxiety, depression, insomnia, tearfulness, panic attacks, hallucinations, paranoia, suicidal or homicidal ideation   Past Medical History:  Diagnosis Date  . Barrett esophagus   . Bronchitis   . C. difficile colitis   . Cellulitis   . Cellulitis   . CHF (congestive heart failure) (HCC)   . COPD (chronic obstructive pulmonary disease) (HCC)   . Diabetes mellitus without complication (HCC)   . Diastolic heart failure (HCC)   . Difficulty walking   . Dysphagia   . Essential hypertension   . Gastritis   . GERD (gastroesophageal reflux disease)   . Gout   . Hepatomegaly    ???  . History of falling   . Hyponatremia   . Hypothyroidism   . Iron deficiency anemia   . Muscle weakness   . PAF (paroxysmal atrial fibrillation) (HCC)    Eliquis  . Sleep apnea   . Stage III chronic kidney disease    Past Surgical History:  Procedure Laterality Date  . BREAST BIOPSY Right Sept, 2012  . CHOLECYSTECTOMY    . COLONOSCOPY    . ESOPHAGEAL DILATION N/A 02/05/2017   Procedure: ESOPHAGEAL DILATION;  Surgeon: Malissa Hippo, MD;  Location: [facility];  Service: Endoscopy;  Laterality: N/A;  . ESOPHAGOGASTRODUODENOSCOPY N/A 02/05/2017  Procedure: ESOPHAGOGASTRODUODENOSCOPY (EGD);  Surgeon: Malissa Hippo, MD;  Location: [facility];  Service: Endoscopy;  Laterality: N/A;  . UPPER GASTROINTESTINAL ENDOSCOPY       reports that she quit smoking about 17 years ago. Her smoking use included Cigarettes. She started smoking about 58 years ago. She has a 15.00  pack-year smoking history. She has never used smokeless tobacco. She reports that she does not drink alcohol or use drugs.  Allergies  Allergen Reactions  . Ciprofloxacin Shortness Of Breath    REACTION: sob,tachycardia  . Doxycycline Nausea Only    Also experienced diarrhea   . Fish Oil     Patient face drew to the side,Bells Palsey  . Guaifenesin Shortness Of Breath    REACTION: sob,tachycardia  . Mucinex [Guaifenesin Er] Shortness Of Breath  . Sulfamethoxazole W/Trimethoprim 800-160 [Sulfamethoxazole-Trimethoprim] Nausea Only    Also lack of appetite   . Uloric [Febuxostat] Swelling    No urination  . Adhesive [Tape] Other (See Comments)    Causes blisters on skin  . Allopurinol Other (See Comments)    Couldn't urinate  . Cefuroxime Axetil Swelling    Swelling all over body-per patient she was hospitalized as a result of taking this medication  . Metolazone Nausea Only    Swelling   . Tramadol     insomnia    Family History  Problem Relation Age of Onset  . Dementia Mother   . Lung cancer Father        smoked  . Hypertension Sister   . Diabetes Brother   . Obesity Sister   . Hypertension Sister   . Restless legs syndrome Sister   . Healthy Sister   . Lung cancer Maternal Uncle      Prior to Admission medications   Medication Sig Start Date End Date Taking? Authorizing Provider  acetaminophen (TYLENOL) 325 MG tablet Take 2 tablets (650 mg total) by mouth every 6 (six) hours as needed for mild pain (or Fever >/= 101). 01/07/17  Yes Kari Baars, MD  albuterol (PROVENTIL HFA;VENTOLIN HFA) 108 (90 Base) MCG/ACT inhaler Inhale 2 puffs into the lungs 4 (four) times daily.    Yes [provider]  allopurinol (ZYLOPRIM) 300 MG tablet Take 0.5 tablets (150 mg total) by mouth daily. 02/07/17  Yes Kari Baars, MD  apixaban (ELIQUIS) 5 MG TABS tablet Take 1 tablet (5 mg total) by mouth 2 (two) times daily. 11/13/14  Yes Kari Baars, MD  [facility] Oil  [facility]) OINT Apply 1 application topically See admin instructions. Apply to sacrum and bilateral buttocks every shift and as needed.   Yes [provider]  calcium carbonate (TUMS - DOSED IN MG ELEMENTAL CALCIUM) 500 MG chewable tablet Chew 1 tablet by mouth 2 (two) times daily.   Yes [provider]  carboxymethylcellulose (REFRESH PLUS) 0.5 % SOLN Apply 1 drop to eye daily as needed (for dry eyes).   Yes [provider]  cetirizine (ZYRTEC) 10 MG tablet Take 10 mg by mouth daily.   Yes [provider]  cholecalciferol (VITAMIN D) 1000 units tablet Take 1,000 Units by mouth every morning.   Yes [provider]  colchicine 0.6 MG tablet Take 1 tablet (0.6 mg total) by mouth daily. 03/20/17  Yes Kari Baars, MD  diltiazem (CARDIZEM CD) 120 MG 24 hr capsule Take 1 capsule (120 mg total) by mouth daily. 01/26/16  Yes Laqueta Linden, MD  Doxepin HCl (SILENOR) 6 MG TABS Take 1  tablet (6 mg total) by mouth at bedtime as needed. Patient taking differently: Take 6 mg by mouth at bedtime as needed (for sleep).  02/06/17  Yes Kari BaarsHawkins, Edward, MD  escitalopram (LEXAPRO) 20 MG tablet Take 1 tablet (20 mg total) by mouth daily. 03/20/17  Yes Kari BaarsHawkins, Edward, MD  fluticasone furoate-vilanterol (BREO ELLIPTA) 200-25 MCG/INH AEPB Inhale 1 puff into the lungs daily.   Yes [provider]  fosfomycin (MONUROL) 3 g PACK Take 3 g by mouth once.   Yes [provider]  gabapentin (NEURONTIN) 100 MG capsule Take 200 mg by mouth 2 (two) times daily.    Yes [provider]  HYDROcodone-acetaminophen (NORCO) 7.5-325 MG tablet Take 1 tablet by mouth 3 (three) times daily as needed for moderate pain.   Yes [provider]  insulin glargine (LANTUS) 100 UNIT/ML injection Inject 0.2 mLs (20 Units total) into the skin daily. Patient taking differently: Inject 20 Units into the skin at bedtime.  01/07/17  Yes Kari BaarsHawkins, Edward, MD  insulin lispro  (HUMALOG) 100 UNIT/ML injection Inject 3-20 Units into the skin 3 (three) times daily before meals. 121-150=1 units 151-200=2 units 201-250=3 units 251-300=5units 301-350=7units 351-400=9units   Yes [provider]  levothyroxine (SYNTHROID, LEVOTHROID) 137 MCG tablet Take 137 mcg by mouth daily before breakfast.   Yes [provider]  loperamide (IMODIUM) 2 MG capsule Take 1 capsule (2 mg total) by mouth every morning. Patient taking differently: Take 2 mg by mouth every morning. *May take up to 4 times daily as needed for loose stools and diarrhea 04/02/17  Yes Rehman, Joline MaxcyNajeeb U, MD  metoprolol tartrate (LOPRESSOR) 25 MG tablet TAKE 1/2 TABLET BY MOUTH TWICE DAILY 10/23/16  Yes Laqueta LindenKoneswaran, Suresh A, MD  Multiple Vitamin (MULTIVITAMIN WITH MINERALS) TABS tablet Take 1 tablet by mouth daily.   Yes [provider]  mupirocin ointment (BACTROBAN) 2 % Place 1 application into the nose 2 (two) times daily.   Yes [provider]  omeprazole (PRILOSEC) 20 MG capsule TAKE ONE CAPSULE BY MOUTH DAILY 05/29/16  Yes Rehman, Joline MaxcyNajeeb U, MD  ondansetron (ZOFRAN-ODT) 4 MG disintegrating tablet Take 1 tablet (4 mg total) by mouth 3 (three) times daily before meals. 03/20/17  Yes Kari BaarsHawkins, Edward, MD  OXYGEN Inhale 2 L into the lungs daily as needed.   Yes [provider]  Pediatric Multivitamins-Iron (FLINTSTONES PLUS IRON) chewable tablet Chew 1 tablet by mouth 2 (two) times daily. 04/02/17  Yes Rehman, Joline MaxcyNajeeb U, MD  potassium chloride SA (K-DUR,KLOR-CON) 20 MEQ tablet Take 1 tablet (20 mEq total) by mouth daily. 12/10/16  Yes Branch, Dorothe PeaJonathan F, MD  sodium bicarbonate 650 MG tablet Take 1 tablet (650 mg total) by mouth 2 (two) times daily. 03/20/17  Yes Kari BaarsHawkins, Edward, MD  triamcinolone (NASACORT ALLERGY 24HR) 55 MCG/ACT AERO nasal inhaler Place 2 sprays into the nose daily.   Yes [provider]  vancomycin (VANCOCIN) 50 mg/mL oral solution Take 5 mLs (250 mg total)  by mouth every 6 (six) hours. Patient taking differently: Take 250 mg by mouth 2 (two) times daily.  03/20/17  Yes Kari BaarsHawkins, Edward, MD    Physical Exam: Vitals:   04/05/17 2100 04/05/17 2202 04/05/17 2331 04/05/17 2350  BP: (!) 114/58 (!) 102/59 91/60 (!) 98/43  Pulse: 90 89 87 90  Resp: 19 17 18 20   Temp:   98.2 F (36.8 C) 98.3 F (36.8 C)  TempSrc:   Oral Oral  SpO2: 100% 99% 100% 100%  Weight:  90.4 kg (199 lb 6.4 oz)      Constitutional: NAD, calm, comfortable Vitals:   04/05/17 2100 04/05/17 2202 04/05/17 2331 04/05/17 2350  BP: (!) 114/58 (!) 102/59 91/60 (!) 98/43  Pulse: 90 89 87 90  Resp: 19 17 18 20   Temp:   98.2 F (36.8 C) 98.3 F (36.8 C)  TempSrc:   Oral Oral  SpO2: 100% 99% 100% 100%  Weight:    90.4 kg (199 lb 6.4 oz)   Eyes: PERRL, lids and conjunctivae normal ENMT: Mucous membranes are moist. Posterior pharynx clear of any exudate or lesions.Normal dentition.  Neck: normal, supple, no masses, no thyromegaly Respiratory: clear to auscultation bilaterally, no wheezing, no crackles. Normal respiratory effort. No accessory muscle use.  Cardiovascular: Regular rate and rhythm, no murmurs / rubs / gallops. No extremity edema. 2+ pedal pulses. No carotid bruits.  Abdomen: no tenderness, no masses palpated. No hepatosplenomegaly. Bowel sounds positive.  Musculoskeletal: no clubbing / cyanosis. No joint deformity upper and lower extremities. Good ROM, no contractures. Normal muscle tone.  Skin: no rashes, lesions, ulcers. No induration Neurologic: CN 2-12 grossly intact. Sensation intact, DTR normal. Strength 5/5 in all 4.  Psychiatric: Normal judgment and insight. Alert and oriented x 3. Normal mood.   Labs on Admission: I have personally reviewed following labs and imaging studies CBC:  Recent Labs Lab 04/01/17 0830 04/04/17 1120 04/05/17 1950  WBC 6.5 6.6 7.9  NEUTROABS 3.1  --  4.9  HGB 9.2* 7.8* 8.7*  HCT 27.9* 23.6* 25.9*  MCV 98.6 97.5 97.0   PLT 332 212 255   Basic Metabolic Panel:  Recent Labs Lab 04/01/17 0830 04/04/17 1120 04/05/17 1950  NA 138 135 135  K 4.3 5.4* 5.5*  CL 107 106 105  CO2 20* 23 22  GLUCOSE 91 101* 101*  BUN 6 7 9   CREATININE 1.79* 2.81* 3.29*  CALCIUM 7.9* 7.4* 7.7*   GFR: Estimated Creatinine Clearance: 15.5 mL/min (A) (by C-G formula based on SCr of 3.29 mg/dL (H)). Liver Function Tests:  Recent Labs Lab 04/05/17 1950  AST 81*  ALT 38  ALKPHOS 187*  BILITOT 1.7*  PROT 5.3*  ALBUMIN 2.5*    Recent Labs Lab 04/05/17 1950  LIPASE 16   Cardiac Enzymes:  Recent Labs Lab 04/05/17 1950  TROPONINI <0.03   CBG:  Recent Labs Lab 04/05/17 1947  GLUCAP 96   Urine analysis:    Component Value Date/Time   COLORURINE AMBER (A) 04/05/2017 1950   APPEARANCEUR TURBID (A) 04/05/2017 1950   LABSPEC 1.012 04/05/2017 1950   PHURINE 5.0 04/05/2017 1950   GLUCOSEU NEGATIVE 04/05/2017 1950   HGBUR SMALL (A) 04/05/2017 1950   BILIRUBINUR NEGATIVE 04/05/2017 1950   KETONESUR NEGATIVE 04/05/2017 1950   PROTEINUR 30 (A) 04/05/2017 1950   NITRITE NEGATIVE 04/05/2017 1950   LEUKOCYTESUR LARGE (A) 04/05/2017 1950   ) Recent Results (from the past 240 hour(s))  C difficile quick scan w PCR reflex     Status: None   Collection Time: 04/02/17  8:00 PM  Result Value Ref Range Status   C Diff antigen NEGATIVE NEGATIVE Final   C Diff toxin NEGATIVE NEGATIVE Final   C Diff interpretation No C. difficile detected.  Final     Radiological Exams on Admission: Ct Head Wo Contrast  Result Date: 04/05/2017 CLINICAL DATA:  Altered mental status with hypoxia EXAM: CT HEAD WITHOUT CONTRAST TECHNIQUE: Contiguous axial images were obtained from the base of the skull through the  vertex without intravenous contrast. COMPARISON:  01/28/2017 FINDINGS: Brain: No acute territorial infarction, hemorrhage or intracranial mass is seen. Atrophy and mild small vessel ischemic changes of the white matter. Tiny  lacunar infarcts, old in the bilateral basal ganglia. Nonenlarged ventricles. Vascular: No hyperdense vessels.  Carotid artery calcifications. Skull: Normal. Negative for fracture or focal lesion. Sinuses/Orbits: No acute finding. Other: None IMPRESSION: No definite CT evidence for acute intracranial abnormality. Electronically Signed   By: Jasmine Pang M.D.   On: 04/05/2017 20:53   Dg Abd Acute W/chest  Result Date: 04/05/2017 CLINICAL DATA:  Altered mental status, being treated for C. difficile infection with vancomycin for 5 days, history CHF, hypertension, diabetes mellitus, COPD EXAM: DG ABDOMEN ACUTE W/ 1V CHEST COMPARISON:  02/10/2017 FINDINGS: Borderline enlargement of cardiac silhouette. Mediastinal contours and pulmonary vascularity normal. Atherosclerotic calcification aortic arch. Linear subsegmental atelectasis at medial LEFT lower lobe. Lungs otherwise clear. No pleural effusion or pneumothorax. Air-filled transverse colon, nondistended. Surgical clips RIGHT upper quadrant likely cholecystectomy. No bowel dilatation, bowel wall thickening, or free air. Bones diffusely demineralized. No definite urinary tract calcification. IMPRESSION: Linear subsegmental atelectasis at medial LEFT lower lobe. No acute abdominal findings. Electronically Signed   By: Ulyses Southward M.D.   On: 04/05/2017 21:00    EKG: Independently reviewed.   Assessment/Plan Principal Problem:   Delirium Active Problems:   Hypothyroidism   AKI (acute kidney injury) (HCC)   Obesity   Acute encephalopathy   Diabetes mellitus type 2 in obese (HCC)   UTI (urinary tract infection)   PLAN:   Altered mental status: Her AMS is that of delirium, and it was mild.   Will hold her narcotics, Tx her UTI, and adjust her meds for now.  Give IVF for her volume depletion and UTI.  She is lucid when I talked to her tonight.  AKI:  Suspect pre renal.   Will give IVF.  Follow Cr.  Will hold her  K supplement.  Hold allopurinol and  Lexapro for now.   DM:  Will give SSI.   Check her CBGs and give carb modified diet.   Hypothyrodism:  Continue with supplement and check TSH.   UTI:  Given her last urine culture, will give Merrem per pharmacy dosing.      DVT prophylaxis:  Eliquis.  Code Status: DNR.  Family Communication: brother and 2 sisters at bedside.  Disposition Plan: SNF.  Consults called: None.  Admission status: OBS>    Tamico Mundo MD FACP. [facility]  If 7PM-7AM, please contact night-coverage [email or web address] Password [email or web address]  04/06/2017, 1:56 AM

## 2017-04-06 NOTE — Progress Notes (Signed)
Patient became agitated and fearful stating that "There is a little girl under my bed, she is reaching up through my covers and is trying to molest me." Patient reassured there was nothing in her bed with her, patient was inconsolable. Patient given once only dose of Ativan, patient has calmed a little. Will continue to monitor.

## 2017-04-06 NOTE — Progress Notes (Signed)
This is an assumption of care note. She was admitted this morning with confusion. She had what appeared to be a urinary tract infection at the skilled care facility and I had ordered a dose of fosfamycin but I'm not sure she actually got that. We've had problems balancing her heart failure versus her renal function at the nursing home. She looks like she's got some cellulitis now. The other problem has been that she's had severe problems with C. difficile colitis. She apparently was having repeat stool studies done but I don't have the results of that yet. She saw her gastroenterologist on the 10th. This morning she is awake and alert with minimal confusion. Her chest is clear. Her heart is regular with normal heart sounds. She has 1-2+ edema of both legs and has what looks like some cellulitis of both legs. Her abdomen is soft  She had confusion acute on chronic renal failure probable UTI and cellulitis. She is on meropenem. This complicates her situation with C. difficile colitis of course. We'll plan to continue treatments

## 2017-04-07 LAB — GLUCOSE, CAPILLARY
GLUCOSE-CAPILLARY: 93 mg/dL (ref 65–99)
GLUCOSE-CAPILLARY: 77 mg/dL (ref 65–99)
Glucose-Capillary: 98 mg/dL (ref 65–99)
Glucose-Capillary: 89 mg/dL (ref 65–99)

## 2017-04-07 LAB — BASIC METABOLIC PANEL
Anion gap: 10 (ref 5–15)
BUN: 10 mg/dL (ref 6–20)
CALCIUM: 7.4 mg/dL — AB (ref 8.9–10.3)
CO2: 20 mmol/L — ABNORMAL LOW (ref 22–32)
CREATININE: 2.62 mg/dL — AB (ref 0.44–1.00)
Chloride: 114 mmol/L — ABNORMAL HIGH (ref 101–111)
GFR, EST AFRICAN AMERICAN: 20 mL/min — AB (ref >60)
GFR, EST NON AFRICAN AMERICAN: 17 mL/min — AB (ref >60)
Glucose, Bld: 86 mg/dL (ref 65–99)
Potassium: 4.5 mmol/L (ref 3.5–5.1)
Sodium: 144 mmol/L (ref 135–145)

## 2017-04-07 LAB — URINE CULTURE

## 2017-04-07 MED ORDER — HALOPERIDOL LACTATE 5 MG/ML IJ SOLN
5.0000 mg | Freq: Four times a day (QID) | INTRAMUSCULAR | Status: DC | PRN
  Administered 2017-04-07 (×2): 5 mg via INTRAVENOUS
  Filled 2017-04-07 (×2): qty 1

## 2017-04-07 MED ORDER — HALOPERIDOL LACTATE 5 MG/ML IJ SOLN
2.0000 mg | Freq: Once | INTRAMUSCULAR | Status: AC
  Administered 2017-04-07: 2 mg via INTRAVENOUS
  Filled 2017-04-07: qty 1

## 2017-04-07 NOTE — Progress Notes (Signed)
Patient is confused and is a sleep now. Will hold breathing treatment till she is awake. No problems noted for now.

## 2017-04-07 NOTE — Progress Notes (Signed)
Patient still sleeping with out problems.

## 2017-04-07 NOTE — Progress Notes (Signed)
Patient's family educated about the importance of wearing PPE and washing hands with soap and water before and after leaving room. Patient's sister stated understanding and says that she doesn't want to get anyone else sick, since I helped her to understand that C Diff is highly infectious.

## 2017-04-07 NOTE — Progress Notes (Signed)
Subjective: She has become increasingly confused and agitated. She has been hallucinating. She received Haldol 1 mg and then 2 mg with little effect. I think this is probably related to her urinary tract infection. She has had diarrhea but she is already being treated for C. difficile. Discussed with her sister at bedside.  Objective: Vital signs in last 24 hours: Temp:  [98 F (36.7 C)-98.7 F (37.1 C)] 98.7 F (37.1 C) (07/15 0539) Pulse Rate:  [96-102] 98 (07/15 0539) Resp:  [20] 20 (07/15 0539) BP: (92-130)/(35-57) 130/50 (07/15 0539) SpO2:  [90 %-100 %] 100 % (07/15 0539) Weight:  [90.4 kg (199 lb 5.4 oz)] 90.4 kg (199 lb 5.4 oz) (07/15 0539) Weight change: -0.027 kg (-1 oz) Last BM Date: 04/06/17  Intake/Output from previous day: 07/14 0701 - 07/15 0700 In: 1906.7 [P.O.:600; I.V.:1206.7; IV Piggyback:100] Out: -   PHYSICAL EXAM General appearance: Awake but agitated Resp: clear to auscultation bilaterally Cardio: regular rate and rhythm, S1, S2 normal, no murmur, click, rub or gallop GI: soft, non-tender; bowel sounds normal; no masses,  no organomegaly Extremities: Swelling of her legs looks better and she has a little bit less erythema No focal neurological findings she is simply agitated confused and hallucinating  Lab Results:  Results for orders placed or performed during the hospital encounter of 04/05/17 (from the past 48 hour(s))  CBG monitoring, ED     Status: None   Collection Time: 04/05/17  7:47 PM  Result Value Ref Range   Glucose-Capillary 96 65 - 99 mg/dL  Urinalysis, Routine w reflex microscopic     Status: Abnormal   Collection Time: 04/05/17  7:50 PM  Result Value Ref Range   Color, Urine AMBER (A) YELLOW    Comment: BIOCHEMICALS MAY BE AFFECTED BY COLOR   APPearance TURBID (A) CLEAR   Specific Gravity, Urine 1.012 1.005 - 1.030   pH 5.0 5.0 - 8.0   Glucose, UA NEGATIVE NEGATIVE mg/dL   Hgb urine dipstick SMALL (A) NEGATIVE   Bilirubin Urine  NEGATIVE NEGATIVE   Ketones, ur NEGATIVE NEGATIVE mg/dL   Protein, ur 30 (A) NEGATIVE mg/dL   Nitrite NEGATIVE NEGATIVE   Leukocytes, UA LARGE (A) NEGATIVE   RBC / HPF 6-30 0 - 5 RBC/hpf   WBC, UA TOO NUMEROUS TO COUNT 0 - 5 WBC/hpf   Bacteria, UA FEW (A) NONE SEEN   Squamous Epithelial / LPF 0-5 (A) NONE SEEN   WBC Clumps PRESENT   Comprehensive metabolic panel     Status: Abnormal   Collection Time: 04/05/17  7:50 PM  Result Value Ref Range   Sodium 135 135 - 145 mmol/L   Potassium 5.5 (H) 3.5 - 5.1 mmol/L   Chloride 105 101 - 111 mmol/L   CO2 22 22 - 32 mmol/L   Glucose, Bld 101 (H) 65 - 99 mg/dL   BUN 9 6 - 20 mg/dL   Creatinine, Ser 3.29 (H) 0.44 - 1.00 mg/dL   Calcium 7.7 (L) 8.9 - 10.3 mg/dL   Total Protein 5.3 (L) 6.5 - 8.1 g/dL   Albumin 2.5 (L) 3.5 - 5.0 g/dL   AST 81 (H) 15 - 41 U/L   ALT 38 14 - 54 U/L   Alkaline Phosphatase 187 (H) 38 - 126 U/L   Total Bilirubin 1.7 (H) 0.3 - 1.2 mg/dL   GFR calc non Af Amer 13 (L) >60 mL/min   GFR calc Af Amer 15 (L) >60 mL/min    Comment: (NOTE) The  eGFR has been calculated using the CKD EPI equation. This calculation has not been validated in all clinical situations. eGFR's persistently <60 mL/min signify possible Chronic Kidney Disease.    Anion gap 8 5 - 15  Lipase, blood     Status: None   Collection Time: 04/05/17  7:50 PM  Result Value Ref Range   Lipase 16 11 - 51 U/L  Troponin I     Status: None   Collection Time: 04/05/17  7:50 PM  Result Value Ref Range   Troponin I <0.03 <0.03 ng/mL  Lactic acid, plasma     Status: None   Collection Time: 04/05/17  7:50 PM  Result Value Ref Range   Lactic Acid, Venous 1.5 0.5 - 1.9 mmol/L  CBC with Differential     Status: Abnormal   Collection Time: 04/05/17  7:50 PM  Result Value Ref Range   WBC 7.9 4.0 - 10.5 K/uL   RBC 2.67 (L) 3.87 - 5.11 MIL/uL   Hemoglobin 8.7 (L) 12.0 - 15.0 g/dL   HCT 62.0 (L) 35.5 - 97.4 %   MCV 97.0 78.0 - 100.0 fL   MCH 32.6 26.0 - 34.0 pg    MCHC 33.6 30.0 - 36.0 g/dL   RDW 16.3 (H) 84.5 - 36.4 %   Platelets 255 150 - 400 K/uL   Neutrophils Relative % 62 %   Neutro Abs 4.9 1.7 - 7.7 K/uL   Lymphocytes Relative 21 %   Lymphs Abs 1.7 0.7 - 4.0 K/uL   Monocytes Relative 14 %   Monocytes Absolute 1.1 (H) 0.1 - 1.0 K/uL   Eosinophils Relative 3 %   Eosinophils Absolute 0.2 0.0 - 0.7 K/uL   Basophils Relative 0 %   Basophils Absolute 0.0 0.0 - 0.1 K/uL  Brain natriuretic peptide     Status: Abnormal   Collection Time: 04/05/17  7:50 PM  Result Value Ref Range   B Natriuretic Peptide 157.0 (H) 0.0 - 100.0 pg/mL  Basic metabolic panel     Status: Abnormal   Collection Time: 04/06/17  5:40 AM  Result Value Ref Range   Sodium 138 135 - 145 mmol/L   Potassium 5.1 3.5 - 5.1 mmol/L   Chloride 109 101 - 111 mmol/L   CO2 22 22 - 32 mmol/L   Glucose, Bld 84 65 - 99 mg/dL   BUN 9 6 - 20 mg/dL   Creatinine, Ser 6.80 (H) 0.44 - 1.00 mg/dL   Calcium 7.3 (L) 8.9 - 10.3 mg/dL   GFR calc non Af Amer 14 (L) >60 mL/min   GFR calc Af Amer 16 (L) >60 mL/min    Comment: (NOTE) The eGFR has been calculated using the CKD EPI equation. This calculation has not been validated in all clinical situations. eGFR's persistently <60 mL/min signify possible Chronic Kidney Disease.    Anion gap 7 5 - 15  TSH     Status: None   Collection Time: 04/06/17  5:40 AM  Result Value Ref Range   TSH 3.893 0.350 - 4.500 uIU/mL    Comment: Performed by a 3rd Generation assay with a functional sensitivity of <=0.01 uIU/mL.  CBC     Status: Abnormal   Collection Time: 04/06/17  5:40 AM  Result Value Ref Range   WBC 7.5 4.0 - 10.5 K/uL   RBC 2.50 (L) 3.87 - 5.11 MIL/uL   Hemoglobin 8.1 (L) 12.0 - 15.0 g/dL   HCT 32.1 (L) 22.4 - 82.5 %   MCV  97.6 78.0 - 100.0 fL   MCH 32.4 26.0 - 34.0 pg   MCHC 33.2 30.0 - 36.0 g/dL   RDW 16.7 (H) 11.5 - 15.5 %   Platelets 262 150 - 400 K/uL  Glucose, capillary     Status: None   Collection Time: 04/06/17  7:47 AM   Result Value Ref Range   Glucose-Capillary 84 65 - 99 mg/dL  Glucose, capillary     Status: None   Collection Time: 04/06/17 11:24 AM  Result Value Ref Range   Glucose-Capillary 93 65 - 99 mg/dL   Comment 1 Notify RN    Comment 2 Document in Chart   Glucose, capillary     Status: None   Collection Time: 04/06/17  5:04 PM  Result Value Ref Range   Glucose-Capillary 82 65 - 99 mg/dL  Glucose, capillary     Status: Abnormal   Collection Time: 04/06/17  8:35 PM  Result Value Ref Range   Glucose-Capillary 129 (H) 65 - 99 mg/dL   Comment 1 Notify RN    Comment 2 Document in Chart   Basic metabolic panel     Status: Abnormal   Collection Time: 04/07/17  5:41 AM  Result Value Ref Range   Sodium 144 135 - 145 mmol/L   Potassium 4.5 3.5 - 5.1 mmol/L   Chloride 114 (H) 101 - 111 mmol/L   CO2 20 (L) 22 - 32 mmol/L   Glucose, Bld 86 65 - 99 mg/dL   BUN 10 6 - 20 mg/dL   Creatinine, Ser 2.62 (H) 0.44 - 1.00 mg/dL   Calcium 7.4 (L) 8.9 - 10.3 mg/dL   GFR calc non Af Amer 17 (L) >60 mL/min   GFR calc Af Amer 20 (L) >60 mL/min    Comment: (NOTE) The eGFR has been calculated using the CKD EPI equation. This calculation has not been validated in all clinical situations. eGFR's persistently <60 mL/min signify possible Chronic Kidney Disease.    Anion gap 10 5 - 15  Glucose, capillary     Status: None   Collection Time: 04/07/17  7:37 AM  Result Value Ref Range   Glucose-Capillary 93 65 - 99 mg/dL   Comment 1 Notify RN    Comment 2 Document in Chart     ABGS No results for input(s): PHART, PO2ART, TCO2, HCO3 in the last 72 hours.  Invalid input(s): PCO2 CULTURES Recent Results (from the past 240 hour(s))  C difficile quick scan w PCR reflex     Status: None   Collection Time: 04/02/17  8:00 PM  Result Value Ref Range Status   C Diff antigen NEGATIVE NEGATIVE Final   C Diff toxin NEGATIVE NEGATIVE Final   C Diff interpretation No C. difficile detected.  Final  Culture, Urine      Status: Abnormal (Preliminary result)   Collection Time: 04/04/17  7:40 AM  Result Value Ref Range Status   Specimen Description URINE, CATHETERIZED  Final   Special Requests NONE  Final   Culture (A)  Final    >=100,000 COLONIES/mL ENTEROBACTER AEROGENES SUSCEPTIBILITIES TO FOLLOW Performed at [facility], [address], [place], [place] [address]    Report Status PENDING  Incomplete   Studies/Results: Ct Head Wo Contrast  Result Date: 04/05/2017 CLINICAL DATA:  Altered mental status with hypoxia EXAM: CT HEAD WITHOUT CONTRAST TECHNIQUE: Contiguous axial images were obtained from the base of the skull through the vertex without intravenous contrast. COMPARISON:  01/28/2017 FINDINGS: Brain:  No acute territorial infarction, hemorrhage or intracranial mass is seen. Atrophy and mild small vessel ischemic changes of the white matter. Tiny lacunar infarcts, old in the bilateral basal ganglia. Nonenlarged ventricles. Vascular: No hyperdense vessels.  Carotid artery calcifications. Skull: Normal. Negative for fracture or focal lesion. Sinuses/Orbits: No acute finding. Other: None IMPRESSION: No definite CT evidence for acute intracranial abnormality. Electronically Signed   By: Donavan Foil M.D.   On: 04/05/2017 20:53   Dg Abd Acute W/chest  Result Date: 04/05/2017 CLINICAL DATA:  Altered mental status, being treated for C. difficile infection with vancomycin for 5 days, history CHF, hypertension, diabetes mellitus, COPD EXAM: DG ABDOMEN ACUTE W/ 1V CHEST COMPARISON:  02/10/2017 FINDINGS: Borderline enlargement of cardiac silhouette. Mediastinal contours and pulmonary vascularity normal. Atherosclerotic calcification aortic arch. Linear subsegmental atelectasis at medial LEFT lower lobe. Lungs otherwise clear. No pleural effusion or pneumothorax. Air-filled transverse colon, nondistended. Surgical clips RIGHT upper quadrant likely cholecystectomy. No bowel dilatation, bowel wall thickening, or  free air. Bones diffusely demineralized. No definite urinary tract calcification. IMPRESSION: Linear subsegmental atelectasis at medial LEFT lower lobe. No acute abdominal findings. Electronically Signed   By: Lavonia Dana M.D.   On: 04/05/2017 21:00    Medications:  Prior to Admission:  Prescriptions Prior to Admission  Medication Sig Dispense Refill Last Dose  . acetaminophen (TYLENOL) 325 MG tablet Take 2 tablets (650 mg total) by mouth every 6 (six) hours as needed for mild pain (or Fever >/= 101).   unknown  . albuterol (PROVENTIL HFA;VENTOLIN HFA) 108 (90 Base) MCG/ACT inhaler Inhale 2 puffs into the lungs 4 (four) times daily.    unknown  . allopurinol (ZYLOPRIM) 300 MG tablet Take 0.5 tablets (150 mg total) by mouth daily.   04/05/2017 at Unknown time  . apixaban (ELIQUIS) 5 MG TABS tablet Take 1 tablet (5 mg total) by mouth 2 (two) times daily. 60 tablet 5 04/05/2017 at Unknown time  . Balsam [place]-Castor Oil (VENELEX) OINT Apply 1 application topically See admin instructions. Apply to sacrum and bilateral buttocks every shift and as needed.   04/05/2017 at Unknown time  . calcium carbonate (TUMS - DOSED IN MG ELEMENTAL CALCIUM) 500 MG chewable tablet Chew 1 tablet by mouth 2 (two) times daily.   04/05/2017 at Unknown time  . carboxymethylcellulose (REFRESH PLUS) 0.5 % SOLN Apply 1 drop to eye daily as needed (for dry eyes).   unknown  . cetirizine (ZYRTEC) 10 MG tablet Take 10 mg by mouth daily.   04/05/2017 at Unknown time  . cholecalciferol (VITAMIN D) 1000 units tablet Take 1,000 Units by mouth every morning.   04/05/2017 at Unknown time  . colchicine 0.6 MG tablet Take 1 tablet (0.6 mg total) by mouth daily.   04/05/2017 at Unknown time  . diltiazem (CARDIZEM CD) 120 MG 24 hr capsule Take 1 capsule (120 mg total) by mouth daily. 90 capsule 3 04/05/2017 at Unknown time  . Doxepin HCl (SILENOR) 6 MG TABS Take 1 tablet (6 mg total) by mouth at bedtime as needed. (Patient taking differently: Take 6  mg by mouth at bedtime as needed (for sleep). ) 30 tablet  unknown  . escitalopram (LEXAPRO) 20 MG tablet Take 1 tablet (20 mg total) by mouth daily. 30 tablet 12 04/05/2017 at Unknown time  . fluticasone furoate-vilanterol (BREO ELLIPTA) 200-25 MCG/INH AEPB Inhale 1 puff into the lungs daily.   04/05/2017 at Unknown time  . fosfomycin (MONUROL) 3 g PACK Take 3 g by mouth  once.   04/05/2017 at Unknown time  . gabapentin (NEURONTIN) 100 MG capsule Take 200 mg by mouth 2 (two) times daily.    04/05/2017 at    . HYDROcodone-acetaminophen (NORCO) 7.5-325 MG tablet Take 1 tablet by mouth 3 (three) times daily as needed for moderate pain.   unknown  . insulin glargine (LANTUS) 100 UNIT/ML injection Inject 0.2 mLs (20 Units total) into the skin daily. (Patient taking differently: Inject 20 Units into the skin at bedtime. ) 10 mL 11 04/04/2017 at Unknown time  . insulin lispro (HUMALOG) 100 UNIT/ML injection Inject 3-20 Units into the skin 3 (three) times daily before meals. 121-150=1 units 151-200=2 units 201-250=3 units 251-300=5units 301-350=7units 351-400=9units   04/05/2017 at Unknown time  . levothyroxine (SYNTHROID, LEVOTHROID) 137 MCG tablet Take 137 mcg by mouth daily before breakfast.   04/05/2017 at Unknown time  . loperamide (IMODIUM) 2 MG capsule Take 1 capsule (2 mg total) by mouth every morning. (Patient taking differently: Take 2 mg by mouth every morning. *May take up to 4 times daily as needed for loose stools and diarrhea) 90 capsule 5 04/05/2017 at Unknown time  . metoprolol tartrate (LOPRESSOR) 25 MG tablet TAKE 1/2 TABLET BY MOUTH TWICE DAILY 90 tablet 1 04/05/2017 at [place]  . Multiple Vitamin (MULTIVITAMIN WITH MINERALS) TABS tablet Take 1 tablet by mouth daily.   04/05/2017 at Unknown time  . mupirocin ointment (BACTROBAN) 2 % Place 1 application into the nose 2 (two) times daily.   04/05/2017 at Unknown time  . omeprazole (PRILOSEC) 20 MG capsule TAKE ONE CAPSULE BY MOUTH DAILY 90 capsule 3  04/05/2017 at Unknown time  . ondansetron (ZOFRAN-ODT) 4 MG disintegrating tablet Take 1 tablet (4 mg total) by mouth 3 (three) times daily before meals. 20 tablet 0 04/05/2017 at Unknown time  . OXYGEN Inhale 2 L into the lungs daily as needed.   unknown  . Pediatric Multivitamins-Iron (FLINTSTONES PLUS IRON) chewable tablet Chew 1 tablet by mouth 2 (two) times daily.   04/05/2017 at Unknown time  . potassium chloride SA (K-DUR,KLOR-CON) 20 MEQ tablet Take 1 tablet (20 mEq total) by mouth daily.   04/05/2017 at Unknown time  . sodium bicarbonate 650 MG tablet Take 1 tablet (650 mg total) by mouth 2 (two) times daily.   04/05/2017 at Unknown time  . triamcinolone (NASACORT ALLERGY 24HR) 55 MCG/ACT AERO nasal inhaler Place 2 sprays into the nose daily.   04/05/2017 at Unknown time  . vancomycin (VANCOCIN) 50 mg/mL oral solution Take 5 mLs (250 mg total) by mouth every 6 (six) hours. (Patient taking differently: Take 250 mg by mouth 2 (two) times daily. )   04/05/2017 at 1000   Scheduled: . albuterol  3 mL Inhalation QID  . apixaban  5 mg Oral BID  . calcium carbonate  1 tablet Oral BID  . diltiazem  120 mg Oral Daily  . feeding supplement (GLUCERNA SHAKE)  237 mL Oral BID BM  . feeding supplement (PRO-STAT SUGAR FREE 64)  30 mL Oral BID  . fluticasone furoate-vilanterol  1 puff Inhalation Daily  . gabapentin  200 mg Oral BID  . insulin aspart  0-15 Units Subcutaneous TID WC  . insulin aspart  0-5 Units Subcutaneous QHS  . levothyroxine  137 mcg Oral QAC breakfast  . metoprolol tartrate  12.5 mg Oral BID  . pantoprazole  40 mg Oral Daily  . sodium bicarbonate  650 mg Oral BID  . vancomycin  250 mg Oral BID  Continuous: . sodium chloride 100 mL/hr at 04/07/17 0446  . meropenem (MERREM) IV Stopped (04/06/17 2303)   TXH:[id number] **OR** acetaminophen, acetaminophen, haloperidol lactate  Assesment:She was admitted with acute encephalopathy/delirium. She appears to have a urinary tract  infection. She's had acute kidney injury. In general she has been sick for the last several months and in and out of the hospital and skilled care facilities. She has C. difficile which is being treated. She is on meropenem for presumed urinary tract infection and cellulitis. Principal Problem:   Delirium Active Problems:   Hypothyroidism   AKI (acute kidney injury) ([place])   Obesity   Acute encephalopathy   Diabetes mellitus type 2 in obese [facility])   UTI (urinary tract infection)    Plan: Continue current treatments. Increase to 5 mg of Haldol. This can be given every 6 hours as needed. Continue antibiotics. Continue with treatment for C. difficile    LOS: 1 day   Temple Ewart L 04/07/2017, 7:45 AM

## 2017-04-08 ENCOUNTER — Inpatient Hospital Stay (HOSPITAL_COMMUNITY): Payer: Medicare Other

## 2017-04-08 LAB — BASIC METABOLIC PANEL
Anion gap: 7 (ref 5–15)
BUN: 14 mg/dL (ref 6–20)
CHLORIDE: 122 mmol/L — AB (ref 101–111)
CO2: 21 mmol/L — AB (ref 22–32)
CREATININE: 2.33 mg/dL — AB (ref 0.44–1.00)
Calcium: 7.4 mg/dL — ABNORMAL LOW (ref 8.9–10.3)
GFR calc Af Amer: 23 mL/min — ABNORMAL LOW (ref >60)
GFR calc non Af Amer: 19 mL/min — ABNORMAL LOW (ref >60)
Glucose, Bld: 87 mg/dL (ref 65–99)
POTASSIUM: 4.3 mmol/L (ref 3.5–5.1)
Sodium: 150 mmol/L — ABNORMAL HIGH (ref 135–145)

## 2017-04-08 LAB — GLUCOSE, CAPILLARY
GLUCOSE-CAPILLARY: 85 mg/dL (ref 65–99)
Glucose-Capillary: 93 mg/dL (ref 65–99)
Glucose-Capillary: 108 mg/dL — ABNORMAL HIGH (ref 65–99)

## 2017-04-08 LAB — URINE CULTURE: Culture: >=100000 — AB

## 2017-04-08 MED ORDER — GABAPENTIN 100 MG PO CAPS
200.0000 mg | ORAL_CAPSULE | Freq: Every day | ORAL | Status: DC
  Administered 2017-04-08 – 2017-04-18 (×11): 200 mg via ORAL
  Filled 2017-04-08 (×11): qty 2

## 2017-04-08 NOTE — Progress Notes (Signed)
Pt transported via [facility].  She remains with eyes closed.  Family to follow [facility] in private automobiles.

## 2017-04-08 NOTE — Progress Notes (Addendum)
Subjective: Events of last night noted. She is calm her. Chest x-ray looks like she has some volume overload. Difficulty has been trying to manage acute renal failure and her volume. Her family is requesting transfer to higher level of care and I think that's appropriate.  Objective: Vital signs in last 24 hours: Temp:  [98 F (36.7 C)-98.2 F (36.8 C)] 98.2 F (36.8 C) (07/16 0508) Pulse Rate:  [86-97] 89 (07/16 0508) Resp:  [20] 20 (07/16 0508) BP: (128-136)/(64-71) 133/71 (07/16 0508) SpO2:  [92 %-97 %] 93 % (07/16 0508) Weight change:  Last BM Date: 04/07/17  Intake/Output from previous day: 07/15 0701 - 07/16 0700 In: 41 [P.O.:120; IV Piggyback:300] Out: 100 [Urine:100]  PHYSICAL EXAM General appearance: Sleepy but calm Resp: rhonchi bilaterally Cardio: regular rate and rhythm, S1, S2 normal, no murmur, click, rub or gallop GI: soft, non-tender; bowel sounds normal; no masses,  no organomegaly Extremities: extremities normal, atraumatic, no cyanosis or edema The exam for extremities is incorrect. She has erythema of both lower legs and has 1+ edema bilaterally.  Lab Results:  Results for orders placed or performed during the hospital encounter of 04/05/17 (from the past 48 hour(s))  Glucose, capillary     Status: None   Collection Time: 04/06/17 11:24 AM  Result Value Ref Range   Glucose-Capillary 93 65 - 99 mg/dL   Comment 1 Notify RN    Comment 2 Document in Chart   Glucose, capillary     Status: None   Collection Time: 04/06/17  5:04 PM  Result Value Ref Range   Glucose-Capillary 82 65 - 99 mg/dL  Glucose, capillary     Status: Abnormal   Collection Time: 04/06/17  8:35 PM  Result Value Ref Range   Glucose-Capillary 129 (H) 65 - 99 mg/dL   Comment 1 Notify RN    Comment 2 Document in Chart   Basic metabolic panel     Status: Abnormal   Collection Time: 04/07/17  5:41 AM  Result Value Ref Range   Sodium 144 135 - 145 mmol/L   Potassium 4.5 3.5 - 5.1 mmol/L    Chloride 114 (H) 101 - 111 mmol/L   CO2 20 (L) 22 - 32 mmol/L   Glucose, Bld 86 65 - 99 mg/dL   BUN 10 6 - 20 mg/dL   Creatinine, Ser 2.62 (H) 0.44 - 1.00 mg/dL   Calcium 7.4 (L) 8.9 - 10.3 mg/dL   GFR calc non Af Amer 17 (L) >60 mL/min   GFR calc Af Amer 20 (L) >60 mL/min    Comment: (NOTE) The eGFR has been calculated using the CKD EPI equation. This calculation has not been validated in all clinical situations. eGFR's persistently <60 mL/min signify possible Chronic Kidney Disease.    Anion gap 10 5 - 15  Glucose, capillary     Status: None   Collection Time: 04/07/17  7:37 AM  Result Value Ref Range   Glucose-Capillary 93 65 - 99 mg/dL   Comment 1 Notify RN    Comment 2 Document in Chart   Glucose, capillary     Status: None   Collection Time: 04/07/17 11:31 AM  Result Value Ref Range   Glucose-Capillary 98 65 - 99 mg/dL   Comment 1 Notify RN    Comment 2 Document in Chart   Glucose, capillary     Status: None   Collection Time: 04/07/17  4:21 PM  Result Value Ref Range   Glucose-Capillary 89 65 - 99  mg/dL   Comment 1 Notify RN    Comment 2 Document in Chart   Glucose, capillary     Status: None   Collection Time: 04/07/17  9:22 PM  Result Value Ref Range   Glucose-Capillary 77 65 - 99 mg/dL   Comment 1 Notify RN    Comment 2 Document in Chart   Basic metabolic panel     Status: Abnormal   Collection Time: 04/08/17  7:13 AM  Result Value Ref Range   Sodium 150 (H) 135 - 145 mmol/L   Potassium 4.3 3.5 - 5.1 mmol/L   Chloride 122 (H) 101 - 111 mmol/L   CO2 21 (L) 22 - 32 mmol/L   Glucose, Bld 87 65 - 99 mg/dL   BUN 14 6 - 20 mg/dL   Creatinine, Ser 2.33 (H) 0.44 - 1.00 mg/dL   Calcium 7.4 (L) 8.9 - 10.3 mg/dL   GFR calc non Af Amer 19 (L) >60 mL/min   GFR calc Af Amer 23 (L) >60 mL/min    Comment: (NOTE) The eGFR has been calculated using the CKD EPI equation. This calculation has not been validated in all clinical situations. eGFR's persistently <60 mL/min  signify possible Chronic Kidney Disease.    Anion gap 7 5 - 15  Glucose, capillary     Status: None   Collection Time: 04/08/17  7:41 AM  Result Value Ref Range   Glucose-Capillary 85 65 - 99 mg/dL    ABGS No results for input(s): PHART, PO2ART, TCO2, HCO3 in the last 72 hours.  Invalid input(s): PCO2 CULTURES Recent Results (from the past 240 hour(s))  C difficile quick scan w PCR reflex     Status: None   Collection Time: 04/02/17  8:00 PM  Result Value Ref Range Status   C Diff antigen NEGATIVE NEGATIVE Final   C Diff toxin NEGATIVE NEGATIVE Final   C Diff interpretation No C. difficile detected.  Final  Culture, Urine     Status: Abnormal   Collection Time: 04/04/17  7:40 AM  Result Value Ref Range Status   Specimen Description URINE, CATHETERIZED  Final   Special Requests NONE  Final   Culture >=100,000 COLONIES/mL ENTEROBACTER AEROGENES (A)  Final   Report Status 04/07/2017 FINAL  Final   Organism ID, Bacteria ENTEROBACTER AEROGENES (A)  Final      Susceptibility   Enterobacter aerogenes - MIC*    CEFAZOLIN >=64 RESISTANT Resistant     CEFTRIAXONE <=1 SENSITIVE Sensitive     CIPROFLOXACIN <=0.25 SENSITIVE Sensitive     GENTAMICIN <=1 SENSITIVE Sensitive     IMIPENEM 1 SENSITIVE Sensitive     NITROFURANTOIN <=16 SENSITIVE Sensitive     TRIMETH/SULFA <=20 SENSITIVE Sensitive     PIP/TAZO <=4 SENSITIVE Sensitive     * >=100,000 COLONIES/mL ENTEROBACTER AEROGENES  Urine culture     Status: Abnormal (Preliminary result)   Collection Time: 04/05/17  7:50 PM  Result Value Ref Range Status   Specimen Description URINE, CATHETERIZED  Final   Special Requests NONE  Final   Culture (A)  Final    >=100,000 COLONIES/mL ENTEROBACTER AEROGENES SUSCEPTIBILITIES TO FOLLOW Performed at [facility], [address], [place], [place] [address]    Report Status PENDING  Incomplete   Studies/Results: Dg Chest Port 1 View  Result Date: 04/08/2017 CLINICAL DATA:   76 year old female with confusion and shortness of breath. EXAM: PORTABLE CHEST 1 VIEW COMPARISON:  Chest radiograph dated 04/05/2017 FINDINGS: There is cardiomegaly  with probable mild vascular congestion. Bibasilar hazy densities, right greater left, have increased compared to prior study. Although this may represent progression of atelectasis, superimposed pneumonia is not excluded. Clinical correlation is recommended. Small bilateral pleural effusions may be present. There is no pneumothorax. No acute osseous pathology. IMPRESSION: 1. Cardiomegaly with mild vascular congestion. 2. Bibasilar densities, progressed compared to the prior radiograph. Pneumonia is not excluded. Clinical correlation is recommended. Electronically Signed   By: Anner Crete M.D.   On: 04/08/2017 04:20    Medications:  Prior to Admission:  Prescriptions Prior to Admission  Medication Sig Dispense Refill Last Dose  . acetaminophen (TYLENOL) 325 MG tablet Take 2 tablets (650 mg total) by mouth every 6 (six) hours as needed for mild pain (or Fever >/= 101).   unknown  . albuterol (PROVENTIL HFA;VENTOLIN HFA) 108 (90 Base) MCG/ACT inhaler Inhale 2 puffs into the lungs 4 (four) times daily.    unknown  . allopurinol (ZYLOPRIM) 300 MG tablet Take 0.5 tablets (150 mg total) by mouth daily.   04/05/2017 at Unknown time  . apixaban (ELIQUIS) 5 MG TABS tablet Take 1 tablet (5 mg total) by mouth 2 (two) times daily. 60 tablet 5 04/05/2017 at Unknown time  . Balsam [place]-Castor Oil (VENELEX) OINT Apply 1 application topically See admin instructions. Apply to sacrum and bilateral buttocks every shift and as needed.   04/05/2017 at Unknown time  . calcium carbonate (TUMS - DOSED IN MG ELEMENTAL CALCIUM) 500 MG chewable tablet Chew 1 tablet by mouth 2 (two) times daily.   04/05/2017 at Unknown time  . carboxymethylcellulose (REFRESH PLUS) 0.5 % SOLN Apply 1 drop to eye daily as needed (for dry eyes).   unknown  . cetirizine (ZYRTEC) 10 MG  tablet Take 10 mg by mouth daily.   04/05/2017 at Unknown time  . cholecalciferol (VITAMIN D) 1000 units tablet Take 1,000 Units by mouth every morning.   04/05/2017 at Unknown time  . colchicine 0.6 MG tablet Take 1 tablet (0.6 mg total) by mouth daily.   04/05/2017 at Unknown time  . diltiazem (CARDIZEM CD) 120 MG 24 hr capsule Take 1 capsule (120 mg total) by mouth daily. 90 capsule 3 04/05/2017 at Unknown time  . Doxepin HCl (SILENOR) 6 MG TABS Take 1 tablet (6 mg total) by mouth at bedtime as needed. (Patient taking differently: Take 6 mg by mouth at bedtime as needed (for sleep). ) 30 tablet  unknown  . escitalopram (LEXAPRO) 20 MG tablet Take 1 tablet (20 mg total) by mouth daily. 30 tablet 12 04/05/2017 at Unknown time  . fluticasone furoate-vilanterol (BREO ELLIPTA) 200-25 MCG/INH AEPB Inhale 1 puff into the lungs daily.   04/05/2017 at Unknown time  . fosfomycin (MONUROL) 3 g PACK Take 3 g by mouth once.   04/05/2017 at Unknown time  . gabapentin (NEURONTIN) 100 MG capsule Take 200 mg by mouth 2 (two) times daily.    04/05/2017 at    . HYDROcodone-acetaminophen (NORCO) 7.5-325 MG tablet Take 1 tablet by mouth 3 (three) times daily as needed for moderate pain.   unknown  . insulin glargine (LANTUS) 100 UNIT/ML injection Inject 0.2 mLs (20 Units total) into the skin daily. (Patient taking differently: Inject 20 Units into the skin at bedtime. ) 10 mL 11 04/04/2017 at Unknown time  . insulin lispro (HUMALOG) 100 UNIT/ML injection Inject 3-20 Units into the skin 3 (three) times daily before meals. 121-150=1 units 151-200=2 units 201-250=3 units 251-300=5units 301-350=7units 351-400=9units  04/05/2017 at Unknown time  . levothyroxine (SYNTHROID, LEVOTHROID) 137 MCG tablet Take 137 mcg by mouth daily before breakfast.   04/05/2017 at Unknown time  . loperamide (IMODIUM) 2 MG capsule Take 1 capsule (2 mg total) by mouth every morning. (Patient taking differently: Take 2 mg by mouth every morning. *May  take up to 4 times daily as needed for loose stools and diarrhea) 90 capsule 5 04/05/2017 at Unknown time  . metoprolol tartrate (LOPRESSOR) 25 MG tablet TAKE 1/2 TABLET BY MOUTH TWICE DAILY 90 tablet 1 04/05/2017 at [place]  . Multiple Vitamin (MULTIVITAMIN WITH MINERALS) TABS tablet Take 1 tablet by mouth daily.   04/05/2017 at Unknown time  . mupirocin ointment (BACTROBAN) 2 % Place 1 application into the nose 2 (two) times daily.   04/05/2017 at Unknown time  . omeprazole (PRILOSEC) 20 MG capsule TAKE ONE CAPSULE BY MOUTH DAILY 90 capsule 3 04/05/2017 at Unknown time  . ondansetron (ZOFRAN-ODT) 4 MG disintegrating tablet Take 1 tablet (4 mg total) by mouth 3 (three) times daily before meals. 20 tablet 0 04/05/2017 at Unknown time  . OXYGEN Inhale 2 L into the lungs daily as needed.   unknown  . Pediatric Multivitamins-Iron (FLINTSTONES PLUS IRON) chewable tablet Chew 1 tablet by mouth 2 (two) times daily.   04/05/2017 at Unknown time  . potassium chloride SA (K-DUR,KLOR-CON) 20 MEQ tablet Take 1 tablet (20 mEq total) by mouth daily.   04/05/2017 at Unknown time  . sodium bicarbonate 650 MG tablet Take 1 tablet (650 mg total) by mouth 2 (two) times daily.   04/05/2017 at Unknown time  . triamcinolone (NASACORT ALLERGY 24HR) 55 MCG/ACT AERO nasal inhaler Place 2 sprays into the nose daily.   04/05/2017 at Unknown time  . vancomycin (VANCOCIN) 50 mg/mL oral solution Take 5 mLs (250 mg total) by mouth every 6 (six) hours. (Patient taking differently: Take 250 mg by mouth 2 (two) times daily. )   04/05/2017 at 1000   Scheduled: . albuterol  3 mL Inhalation QID  . apixaban  5 mg Oral BID  . calcium carbonate  1 tablet Oral BID  . diltiazem  120 mg Oral Daily  . feeding supplement (GLUCERNA SHAKE)  237 mL Oral BID BM  . feeding supplement (PRO-STAT SUGAR FREE 64)  30 mL Oral BID  . fluticasone furoate-vilanterol  1 puff Inhalation Daily  . gabapentin  200 mg Oral BID  . insulin aspart  0-15 Units Subcutaneous  TID WC  . insulin aspart  0-5 Units Subcutaneous QHS  . levothyroxine  137 mcg Oral QAC breakfast  . metoprolol tartrate  12.5 mg Oral BID  . pantoprazole  40 mg Oral Daily  . sodium bicarbonate  650 mg Oral BID  . vancomycin  250 mg Oral BID   Continuous: . meropenem (MERREM) IV Stopped (04/07/17 2328)   SJG:GEZMOQHUTMLYY **OR** acetaminophen, acetaminophen, haloperidol lactate  Assesment:She was admitted with delirium. Presumably from urinary tract infection. She has a very complicated history of an episode of acute delirium in the last several months likely related to urinary tract infection which caused her to have severe weakness and she was sent from here to a skilled care facility. She did not do well in the skilled care facility and was readmitted to the hospital with hyponatremia and acute renal failure which was treated. She was then sent to a different skilled care facility and initially did pretty well then developed hyponatremia and acute renal failure again. This was treated  and resolved and she went back to the skilled care facility. She was having some issues with edema was placed on diuretics and then developed what seems to have been a urinary tract infection and developed delirium. She has been in the hospital since. Delirium seems better with intravenous Haldol but were still having a struggle to treat and balance renal failure versus heart failure. 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 Her family has requested transfer to higher level of care which I think is appropriate considering her multiple medical problems. If we can get her transferred on going to move to stepdown Principal Problem:   Delirium Active Problems:   Hypothyroidism   AKI (acute kidney injury) ([place])    Obesity   Acute encephalopathy   Diabetes mellitus type 2 in obese [facility])   UTI (urinary tract infection)    Plan: As above    LOS: 2 days   Kolston Lacount L 04/08/2017, 8:31 AM

## 2017-04-08 NOTE — Progress Notes (Signed)
[facility] in bldg to transport [facility].  Multiple family members are at the bedside.  Pt is calm and cooperative with staff.  Per family, pt is Full Code.  DNR and/or MOST form not located in chart.  Pt will remain Full Code unless otherwise noted by family of change in code status.  Family encouraged to make staff aware of code status at [facility]

## 2017-04-08 NOTE — Progress Notes (Signed)
Called nurse to see if patient is able to have [facility] at [facility]; [facility] per family's request and not available for EEG at this time.

## 2017-04-08 NOTE — Progress Notes (Signed)
[facility] requested Demographics sheet on Pt.  Awaiting for bed assignment.  Family updated

## 2017-04-08 NOTE — Plan of Care (Signed)
   Discussed patient's case and care plan with attending Dr. Shaune PollackEd Hawkins at [facility]. Requesting [facility] for her level of care and additional consultation services not available at [facility]. Patient was initially admitted for acute delirium thought to be secondary to UTI. Patient has had a very difficult medical course over the last 3 months with recurrent intermittent episodes of UTI showing increasing levels of resistance, generalized fatigue and weakness and failure to thrive necessitating stays in rehabilitation facilities, and multiple admissions with hyponatremia and UTIs and renal failure. Patient states to make urine and renal function slowly improving the patient has now developed new onset congestive heart failure and twitching. Neurology coverage not available at [facility] and time of consultation. Patient also continues treatment for chronic C. Difficile. Following consultation services are requested or at least asked to be considered at time of transfer to [facility], infectious disease, neurology. Pt [facility].   Shelly Flattenavid Baneen Wieseler, MD [facility] 04/08/2017, 10:43 AM

## 2017-04-08 NOTE — Progress Notes (Signed)
   04/08/17 1625  Clinical Encounter Type  Visited With Family  Visit Type Other (Comment) ([place] consult)  Spiritual Encounters  Spiritual Needs Emotional  Stress Factors  Family Stress Factors Family relationships  Introduction to family member and nurse outside room. No understanding of consult and family coping.

## 2017-04-08 NOTE — Progress Notes (Signed)
PT Cancellation Note  Patient Details Name: Yolanda [name] MRN: [id number] DOB: [birth year]   Cancelled Treatment:    Reason Eval/Treat Not Completed: Other (comment) (Chart reviewed, RN consulted: family heavily advocating for pt transfer, does not wish PT to work with pt at this time. Per RN, [facility]. ) Will complete PT order at this time, signing off.   11:32 AM, 04/08/17 Rosamaria LintsAllan C Sigmond Patalano, PT, DPT Physical Therapist - [place] [phone](ASCOM)  [phone] (Office)     Radin Raptis C 04/08/2017, 11:31 AM

## 2017-04-08 NOTE — Progress Notes (Signed)
Pt's has approximately 10 family members in waiting room demanding a transfer to [facility].  It's been explained to them, that Dr. Juanetta GoslingHawkins has placed order for [facility] to [facility], However there is not a bed available at this time.  Dr. Juanetta GoslingHawkins ordered a transfer to step [facility] at [facility], until [facility] is available [facility], and family declines and continues to [facility].  Dr. Juanetta GoslingHawkins is aware.  Also, pt is asleep and appears comfortable with 1;1 sitter at the bedside.  Family has requested that patient not be disturbed for medications or PT.  PT is aware.  Will continue to monitor.

## 2017-04-08 NOTE — Progress Notes (Signed)
Patient sounded wet, contacted on call MD Dr. Selena BattenKim regarding edema, low urinary output and wet sound.  Chest xray ordered, fluids discontinued as patient has 3+ edema to legs and arms. Patient O2 sat was 92% on 2 lpm via [place]. Had patient cough hard couple times O2 sat came back to 94%.

## 2017-04-08 NOTE — Progress Notes (Signed)
Lourdes SledgeLouise Price, Sister and [facility], reports that patient DOES want to be resuscitated; please make a FULL CODE.  Patient confirms that she wants to be resuscitated if temporary, no life support long term.

## 2017-04-08 NOTE — Progress Notes (Signed)
[facility] Available [facility] Unit: Room #2C06-01.  Report given to Tammy, RN and to [facility].  Family update given.

## 2017-04-09 ENCOUNTER — Inpatient Hospital Stay (HOSPITAL_COMMUNITY): Payer: Medicare Other

## 2017-04-09 DIAGNOSIS — E669 Obesity, unspecified: Secondary | ICD-10-CM

## 2017-04-09 DIAGNOSIS — R410 Disorientation, unspecified: Secondary | ICD-10-CM

## 2017-04-09 DIAGNOSIS — N390 Urinary tract infection, site not specified: Principal | ICD-10-CM

## 2017-04-09 DIAGNOSIS — E1169 Type 2 diabetes mellitus with other specified complication: Secondary | ICD-10-CM

## 2017-04-09 DIAGNOSIS — G9340 Encephalopathy, unspecified: Secondary | ICD-10-CM

## 2017-04-09 DIAGNOSIS — E039 Hypothyroidism, unspecified: Secondary | ICD-10-CM

## 2017-04-09 LAB — BASIC METABOLIC PANEL
Anion gap: 6 (ref 5–15)
BUN: 14 mg/dL (ref 6–20)
CO2: 21 mmol/L — ABNORMAL LOW (ref 22–32)
Calcium: 7.4 mg/dL — ABNORMAL LOW (ref 8.9–10.3)
Chloride: 117 mmol/L — ABNORMAL HIGH (ref 101–111)
Creatinine, Ser: 2.03 mg/dL — ABNORMAL HIGH (ref 0.44–1.00)
GFR calc Af Amer: 27 mL/min — ABNORMAL LOW (ref >60)
GFR calc non Af Amer: 23 mL/min — ABNORMAL LOW (ref >60)
Glucose, Bld: 85 mg/dL (ref 65–99)
Potassium: 4.3 mmol/L (ref 3.5–5.1)
Sodium: 144 mmol/L (ref 135–145)

## 2017-04-09 LAB — HEPATIC FUNCTION PANEL
ALK PHOS: 159 U/L — AB (ref 38–126)
ALT: 24 U/L (ref 14–54)
AST: 46 U/L — ABNORMAL HIGH (ref 15–41)
Albumin: 2.1 g/dL — ABNORMAL LOW (ref 3.5–5.0)
Bilirubin, Direct: 0.5 mg/dL (ref 0.1–0.5)
Indirect Bilirubin: 0.6 mg/dL (ref 0.3–0.9)
TOTAL PROTEIN: 4.6 g/dL — AB (ref 6.5–8.1)
Total Bilirubin: 1.1 mg/dL (ref 0.3–1.2)

## 2017-04-09 LAB — CBC
HCT: 24.2 % — ABNORMAL LOW (ref 36.0–46.0)
Hemoglobin: 7.8 g/dL — ABNORMAL LOW (ref 12.0–15.0)
MCH: 31.2 pg (ref 26.0–34.0)
MCHC: 32.2 g/dL (ref 30.0–36.0)
MCV: 96.8 fL (ref 78.0–100.0)
Platelets: 245 10*3/uL (ref 150–400)
RBC: 2.5 MIL/uL — ABNORMAL LOW (ref 3.87–5.11)
RDW: 16.7 % — ABNORMAL HIGH (ref 11.5–15.5)
WBC: 9.6 10*3/uL (ref 4.0–10.5)

## 2017-04-09 LAB — GLUCOSE, CAPILLARY
GLUCOSE-CAPILLARY: 101 mg/dL — AB (ref 65–99)
Glucose-Capillary: 75 mg/dL (ref 65–99)
Glucose-Capillary: 93 mg/dL (ref 65–99)
Glucose-Capillary: 112 mg/dL — ABNORMAL HIGH (ref 65–99)
Glucose-Capillary: 108 mg/dL — ABNORMAL HIGH (ref 65–99)

## 2017-04-09 LAB — MAGNESIUM: Magnesium: 1 mg/dL — ABNORMAL LOW (ref 1.7–2.4)

## 2017-04-09 LAB — AMMONIA: Ammonia: 78 umol/L — ABNORMAL HIGH (ref 9–35)

## 2017-04-09 MED ORDER — FUROSEMIDE 10 MG/ML IJ SOLN
40.0000 mg | Freq: Once | INTRAMUSCULAR | Status: AC
  Administered 2017-04-09: 40 mg via INTRAVENOUS
  Filled 2017-04-09: qty 4

## 2017-04-09 MED ORDER — MAGNESIUM SULFATE 2 GM/50ML IV SOLN
2.0000 g | Freq: Once | INTRAVENOUS | Status: AC
  Administered 2017-04-09: 2 g via INTRAVENOUS
  Filled 2017-04-09: qty 50

## 2017-04-09 NOTE — Care Management Note (Signed)
Case Management Note  Patient Details  Name: Yolanda [name] MRN: [id number] Date of Birth: 08/06/1941  Subjective/Objective:    Pt transferred from [facility] with acute delirium secondary to UTI                Action/Plan:  [facility] at [facility] - family refusing for pt to return to SNF - request new SNF consideration.  CSW consulted   Expected Discharge Date:                  Expected Discharge Plan:  Skilled Nursing Facility (From [facility])  In-House Referral:  Clinical Social Work  Discharge planning Services  CM Consult  Post Acute Care Choice:    Choice offered to:     DME Arranged:    DME Agency:     HH Arranged:    HH Agency:     Status of Service:     If discussed at [employer] Meetings, dates discussed:    Additional Comments:  Yolanda [name], Yolanda Victor S, RN 04/09/2017, 11:39 AM

## 2017-04-09 NOTE — Progress Notes (Signed)
Lourdes SledgeLouise Price sister HCPOA would like to talk with MD.  Karena Addisonoro, Amery Minasyan T

## 2017-04-09 NOTE — Progress Notes (Signed)
EEG Completed; Results Pending  

## 2017-04-09 NOTE — Progress Notes (Signed)
Paged md regarding mag level =1.0. Will continue to monitor.

## 2017-04-09 NOTE — Progress Notes (Signed)
6 beat Vt noted. Md paged to see if wanted mag added to labs this am. Will continue to monitor. Karena Addisonoro, Yvonna Brun T

## 2017-04-09 NOTE — Progress Notes (Signed)
Notified Md about no labs ordered this am... Pt not really voiding a lot and last NA was 150.  Will continue to monitor Yolanda AddisonCoro, [name]

## 2017-04-09 NOTE — Plan of Care (Signed)
Problem: Safety: Goal: Ability to remain free from injury will improve Outcome: Progressing Placed patient in low bed

## 2017-04-09 NOTE — Procedures (Signed)
ELECTROENCEPHALOGRAM REPORT  Date of Study: 04/09/2017  Patient's Name: Yolanda [name] MRN: [id number] Date of Birth: Sep 17, 1941  Referring Provider: Dr. Pearson GrippeJames Kim  Clinical History: This is a 76 year old woman with twitching and shaking.  Medications: gabapentin (NEURONTIN) capsule 200 mg  acetaminophen (TYLENOL) tablet 650 mg  albuterol (PROVENTIL) (2.5 MG/3ML) 0.083% nebulizer solution 3 mL  apixaban (ELIQUIS) tablet 5 mg  calcium carbonate (TUMS - dosed in mg elemental calcium) chewable tablet 200 mg of elemental calcium  diltiazem (CARDIZEM CD) 24 hr capsule 120 mg  feeding supplement (PRO-STAT SUGAR FREE 64) liquid 30 mL  fluticasone furoate-vilanterol (BREO ELLIPTA) 200-25 MCG/INH 1 puff  haloperidol lactate (HALDOL) injection 5 mg  insulin aspart (novoLOG) injection 0-15 Units  insulin aspart (novoLOG) injection 0-5 Units  levothyroxine (SYNTHROID, LEVOTHROID) tablet 137 mcg  meropenem (MERREM) 1 g in sodium chloride 0.9 % 100 mL IVPB  metoprolol tartrate (LOPRESSOR) tablet 12.5 mg  pantoprazole (PROTONIX) EC tablet 40 mg  sodium bicarbonate tablet 650 mg  vancomycin (VANCOCIN) 50 mg/mL oral solution 250 mg   Technical Summary: A multichannel digital EEG recording measured by the international 10-20 system with electrodes applied with paste and impedances below 5000 ohms performed as portable with EKG monitoring in an awake and drowsy patient.  Hyperventilation and photic stimulation were not performed.  The digital EEG was referentially recorded, reformatted, and digitally filtered in a variety of bipolar and referential montages for optimal display.   Description: The patient is awake and drowsy during the recording.  During maximal wakefulness, there is a symmetric, medium voltage 6.5-7 Hz posterior dominant rhythm that attenuates with eye opening. This is admixed with a large amount of diffuse 4-6 Hz theta slowing of the waking background.  During drowsiness, there is an  increase in theta and delta slowing of the background. Deeper stages of sleep were not seen. Hyperventilation and photic stimulation were not performed. She is noted to have alternating twitching on both feet, low amplitude whole body shaking/tremulousness, lifts right arm with shaking, with no epileptiform correlate. There was muscle artifact over the bilateral temporal regions during the study, but in between artifact, there were no epileptiform discharges or electrographic seizures seen.    EKG lead was unremarkable.  Impression: This awake and drowsy EEG is abnormal due to the presence of: 1. Slowing of the posterior dominant rhythm 2. Mild diffuse slowing of the waking background  Clinical Correlation of the above findings indicates diffuse cerebral dysfunction that is non-specific in etiology and can be seen with hypoxic/ischemic injury, toxic/metabolic encephalopathies, neurodegenerative disorders, or medication effect. Twitching and shaking captured did not show any electrographic correlate. Clinical correlation is advised.   Patrcia DollyKaren Jaquila Santelli, M.D.

## 2017-04-09 NOTE — Progress Notes (Addendum)
[facility] PROGRESS NOTE  Yolanda MurrainBrenda W [name] YNW:[id number] DOB: Jul 26, 1941 DOA: 04/05/2017  PCP: Yolanda [name], Edward, MD  Brief History/Interval Summary: 76 year old Caucasian female who is had multiple urinary tract infections over the last few weeks and has been hospitalized to [facility]. She's also had C. difficile colitis and was treated with oral vancomycin. She's also had hyponatremia, renal failure. She was sent over to a skilled nursing facility. She presented back to the hospital with intermittent confusion. She was found to have elevated creatinine. She was found to have abnormal UA. She was also started on antibiotics. Family wanted a higher level of care and so she was transferred over here for further workup.  Reason for Visit: Acute encephalopathy. Acute renal failure. UTI.  Consultants: None  Procedures: EEG is pending  Antibiotics: Meropenem. Continues to be on oral vancomycin taper  Subjective/Interval History: Patient awake, alert this morning. She was experiencing hallucinations but none since she has been here. Denies any hallucinations currently. She is tremulous. Denies any headaches. No chest pain, shortness of breath. No significant stool output per nursing staff.  ROS: No nausea or vomiting  Objective:  Vital Signs  Vitals:   04/09/17 0300 04/09/17 0744 04/09/17 0755 04/09/17 0953  BP: (!) 116/50  (!) 115/50 (!) 110/51  Pulse: 97  100 100  Resp: 11  20   Temp: 97.6 F (36.4 C)  97.9 F (36.6 C)   TempSrc: Oral  Oral   SpO2: 100% 100% 99%   Weight:      Height:        Intake/Output Summary (Last 24 hours) at 04/09/17 1106 Last data filed at 04/09/17 0800  Gross per 24 hour  Intake              170 ml  Output              376 ml  Net             -206 ml   Filed Weights   04/05/17 2350 04/07/17 0539 04/09/17 0100  Weight: 90.4 kg (199 lb 6.4 oz) 90.4 kg (199 lb 5.4 oz) 93 kg (205 lb 0.4 oz)    General appearance: alert,  cooperative, distracted and no distress Resp: Diminished air entry at the bases. No wheezing, rales or rhonchi. Cardio: regular rate and rhythm, S1, S2 normal, no murmur, click, rub or gallop GI: soft, non-tender; bowel sounds normal; no masses,  no organomegaly Extremities: edema 1 to 2+ edema noted. Upper and lower extremities Neurologic: She is awake and alert. Distracted. Tremulous. Oriented to place, year, month person. No facial asymmetry. Tongue is midline. No focal neurological deficits appreciated. Motor strength appears to be equal. Generalized fatigue is appreciated.  Lab Results:  Data Reviewed: I have personally reviewed following labs and imaging studies  CBC:  Recent Labs Lab 04/04/17 1120 04/05/17 1950 04/06/17 0540 04/09/17 0828  WBC 6.6 7.9 7.5 9.6  NEUTROABS  --  4.9  --   --   HGB 7.8* 8.7* 8.1* 7.8*  HCT 23.6* 25.9* 24.4* 24.2*  MCV 97.5 97.0 97.6 96.8  PLT 212 255 262 245    Basic Metabolic Panel:  Recent Labs Lab 04/05/17 1950 04/06/17 0540 04/07/17 0541 04/08/17 0713 04/09/17 0828  NA 135 138 144 150* 144  K 5.5* 5.1 4.5 4.3 4.3  CL 105 109 114* 122* 117*  CO2 22 22 20* 21* 21*  GLUCOSE 101* 84 86 87 85  BUN 9 9 10  14  14  CREATININE 3.29* 3.06* 2.62* 2.33* 2.03*  CALCIUM 7.7* 7.3* 7.4* 7.4* 7.4*  MG  --   --   --   --  1.0*    GFR: Estimated Creatinine Clearance: 25.6 mL/min (A) (by C-G formula based on SCr of 2.03 mg/dL (H)).  Liver Function Tests:  Recent Labs Lab 04/05/17 1950 04/09/17 0828  AST 81* 46*  ALT 38 24  ALKPHOS 187* 159*  BILITOT 1.7* 1.1  PROT 5.3* 4.6*  ALBUMIN 2.5* 2.1*     Recent Labs Lab 04/05/17 1950  LIPASE 16    Recent Labs Lab 04/09/17 0828  AMMONIA 78*    Cardiac Enzymes:  Recent Labs Lab 04/05/17 1950  TROPONINI <0.03   CBG:  Recent Labs Lab 04/08/17 0741 04/08/17 1116 04/08/17 1642 04/08/17 2103 04/09/17 0754  GLUCAP 85 93 108* 101* 75     Recent Results (from the past  240 hour(s))  C difficile quick scan w PCR reflex     Status: None   Collection Time: 04/02/17  8:00 PM  Result Value Ref Range Status   C Diff antigen NEGATIVE NEGATIVE Final   C Diff toxin NEGATIVE NEGATIVE Final   C Diff interpretation No C. difficile detected.  Final  Culture, Urine     Status: Abnormal   Collection Time: 04/04/17  7:40 AM  Result Value Ref Range Status   Specimen Description URINE, CATHETERIZED  Final   Special Requests NONE  Final   Culture >=100,000 COLONIES/mL ENTEROBACTER AEROGENES (A)  Final   Report Status 04/07/2017 FINAL  Final   Organism ID, Bacteria ENTEROBACTER AEROGENES (A)  Final      Susceptibility   Enterobacter aerogenes - MIC*    CEFAZOLIN >=64 RESISTANT Resistant     CEFTRIAXONE <=1 SENSITIVE Sensitive     CIPROFLOXACIN <=0.25 SENSITIVE Sensitive     GENTAMICIN <=1 SENSITIVE Sensitive     IMIPENEM 1 SENSITIVE Sensitive     NITROFURANTOIN <=16 SENSITIVE Sensitive     TRIMETH/SULFA <=20 SENSITIVE Sensitive     PIP/TAZO <=4 SENSITIVE Sensitive     * >=100,000 COLONIES/mL ENTEROBACTER AEROGENES  Urine culture     Status: Abnormal   Collection Time: 04/05/17  7:50 PM  Result Value Ref Range Status   Specimen Description URINE, CATHETERIZED  Final   Special Requests NONE  Final   Culture >=100,000 COLONIES/mL ENTEROBACTER AEROGENES (A)  Final   Report Status 04/08/2017 FINAL  Final   Organism ID, Bacteria ENTEROBACTER AEROGENES (A)  Final      Susceptibility   Enterobacter aerogenes - MIC*    CEFAZOLIN >=64 RESISTANT Resistant     CEFTRIAXONE <=1 SENSITIVE Sensitive     CIPROFLOXACIN <=0.25 SENSITIVE Sensitive     GENTAMICIN <=1 SENSITIVE Sensitive     IMIPENEM 2 SENSITIVE Sensitive     NITROFURANTOIN <=16 SENSITIVE Sensitive     TRIMETH/SULFA <=20 SENSITIVE Sensitive     PIP/TAZO <=4 SENSITIVE Sensitive     * >=100,000 COLONIES/mL ENTEROBACTER AEROGENES      Radiology Studies: Dg Chest Port 1 View  Result Date: 04/08/2017 CLINICAL  DATA:  76 year old female with confusion and shortness of breath. EXAM: PORTABLE CHEST 1 VIEW COMPARISON:  Chest radiograph dated 04/05/2017 FINDINGS: There is cardiomegaly with probable mild vascular congestion. Bibasilar hazy densities, right greater left, have increased compared to prior study. Although this may represent progression of atelectasis, superimposed pneumonia is not excluded. Clinical correlation is recommended. Small bilateral pleural effusions may be present. There is no  pneumothorax. No acute osseous pathology. IMPRESSION: 1. Cardiomegaly with mild vascular congestion. 2. Bibasilar densities, progressed compared to the prior radiograph. Pneumonia is not excluded. Clinical correlation is recommended. Electronically Signed   By: Elgie Collard M.D.   On: 04/08/2017 04:20     Medications:  Scheduled: . albuterol  3 mL Inhalation QID  . apixaban  5 mg Oral BID  . calcium carbonate  1 tablet Oral BID  . diltiazem  120 mg Oral Daily  . feeding supplement (PRO-STAT SUGAR FREE 64)  30 mL Oral BID  . fluticasone furoate-vilanterol  1 puff Inhalation Daily  . gabapentin  200 mg Oral QHS  . insulin aspart  0-15 Units Subcutaneous TID WC  . insulin aspart  0-5 Units Subcutaneous QHS  . levothyroxine  137 mcg Oral QAC breakfast  . metoprolol tartrate  12.5 mg Oral BID  . pantoprazole  40 mg Oral Daily  . sodium bicarbonate  650 mg Oral BID  . vancomycin  250 mg Oral BID   Continuous: . magnesium sulfate 1 - 4 g bolus IVPB    . meropenem (MERREM) IV 1 g (04/09/17 1053)   ZOX:[id number] **OR** acetaminophen, acetaminophen, haloperidol lactate  Assessment/Plan:  Principal Problem:   Delirium Active Problems:   Hypothyroidism   AKI (acute kidney injury) (HCC)   Obesity   Acute encephalopathy   Diabetes mellitus type 2 in obese (HCC)   UTI (urinary tract infection)    Acute metabolic encephalopathy. Altered mental status could have been due to various reasons including  renal failure, infection, and narcotics that she was getting at the nursing facility. She seems to be much more awake, alert. She is tremulous. She was having hallucinations which appears to have resolved. EEG is pending. CT head did not show any acute abnormalities. Continue to monitor mental status closely. If she does not improve, then MRI brain can be considered. At this time she does not have any focal neurological deficits. Ammonia level is noted to be elevated. LFTs are normal. She tells me that she has fatty liver. Could consider lactulose. However, that this might make her diarrhea worse, so we will continue to watch for now epecially since her mental status appears to be improving. CT of the abdomen done in May does not show any significant liver disease. Mild hepatic steatosis was noted.  Urinary tract infection with Enterobacter. Patient is currently on meropenem. She has allergies to multiple antibiotics. She was recently diagnosed with C. difficile and continues to be on vancomycin. Previous urine culture data are reviewed. She had Citrobacter and Klebsiella back in June. It looks like she may not have been treated back in June due to C. difficile.  Recently diagnosed C. difficile She is on a long vancomycin taper. Continue for now. Does not have significant diarrhea.  Acute renal failure/hypomagnesemia She likely has an element of chronic kidney disease although unable to specify stage at this time. Creatinine was 1.3 in April 2017. 1.4 in April 2018. Has been as low as 0.97 in May. In June, it reached as high as 4.28 and then came down to about 1.3- 1.7. And then it was 3.29 on July 13 and today is 2.03. She has received IV fluids. She has gained weight. She has had sluggish urine output. She has edema. It appears that she will was on torsemide at least back in June when she was initially hospitalized. It does not appear that she was placed back on it. We will give her  a dose of Lasix today  and then monitor for response. Recheck her renal function tomorrow. Renal ultrasound done in April of this year did not show any acute findings. Will replete Mg. Noted to be on sodium bicarbonate, which will be continued. Bicarbonate level is stable.  History of hypothyroidism. Continue Synthroid.  History of paroxysmal atrial fibrillation. On diltiazem and beta blocker. Heart rate is well-controlled. She is on anticoagulation with request.  Chronic diastolic CHF. Chest x-ray done yesterday morning showed the vascular congestion. No crackles heard on examination. However, she does have edema. As mentioned above we will give her a dose of IV Lasix. Last echocardiogram done in April 2017. Systolic function was normal.  History of COPD Stable. Continue home medications.   Diabetes mellitus type 2 Monitor CBGs. Continue SSI.  Normocytic Anemia, likely due to Chronic disease. Hemoglobin is low but stable. No overt bleeding noted.   DVT Prophylaxis: She is anticoagulated    Code Status: Full code  Family Communication: Discussed with patient and with her sister/HCPOA in detail Disposition Plan: Management as outlined above.    LOS: 3 days   [facility]  [facility] Pager [phone] 04/09/2017, 11:06 AM  If 7PM-7AM, please contact night-coverage at [email or web address], password [facility]

## 2017-04-09 NOTE — Progress Notes (Signed)
Notified md of patients urine output. 50cc over last 4 hours. Will continue to monitor.

## 2017-04-10 DIAGNOSIS — E038 Other specified hypothyroidism: Secondary | ICD-10-CM

## 2017-04-10 LAB — BASIC METABOLIC PANEL
ANION GAP: 8 (ref 5–15)
BUN: 17 mg/dL (ref 6–20)
CALCIUM: 7.4 mg/dL — AB (ref 8.9–10.3)
CO2: 20 mmol/L — ABNORMAL LOW (ref 22–32)
Chloride: 111 mmol/L (ref 101–111)
Creatinine, Ser: 1.84 mg/dL — ABNORMAL HIGH (ref 0.44–1.00)
GFR calc Af Amer: 30 mL/min — ABNORMAL LOW (ref >60)
GFR, EST NON AFRICAN AMERICAN: 26 mL/min — AB (ref >60)
Glucose, Bld: 95 mg/dL (ref 65–99)
POTASSIUM: 3.8 mmol/L (ref 3.5–5.1)
SODIUM: 139 mmol/L (ref 135–145)

## 2017-04-10 LAB — CBC
HCT: 22.3 % — ABNORMAL LOW (ref 36.0–46.0)
Hemoglobin: 7.4 g/dL — ABNORMAL LOW (ref 12.0–15.0)
MCH: 31.6 pg (ref 26.0–34.0)
MCHC: 33.2 g/dL (ref 30.0–36.0)
MCV: 95.3 fL (ref 78.0–100.0)
PLATELETS: 217 10*3/uL (ref 150–400)
RBC: 2.34 MIL/uL — AB (ref 3.87–5.11)
RDW: 16.7 % — AB (ref 11.5–15.5)
WBC: 7.9 10*3/uL (ref 4.0–10.5)

## 2017-04-10 LAB — GLUCOSE, CAPILLARY
GLUCOSE-CAPILLARY: 90 mg/dL (ref 65–99)
GLUCOSE-CAPILLARY: 129 mg/dL — AB (ref 65–99)
Glucose-Capillary: 131 mg/dL — ABNORMAL HIGH (ref 65–99)
Glucose-Capillary: 118 mg/dL — ABNORMAL HIGH (ref 65–99)

## 2017-04-10 MED ORDER — SODIUM CHLORIDE 0.9 % IV BOLUS (SEPSIS)
500.0000 mL | Freq: Once | INTRAVENOUS | Status: AC
  Administered 2017-04-10: 500 mL via INTRAVENOUS

## 2017-04-10 NOTE — Progress Notes (Addendum)
Pharmacy Antibiotic Note  Yolanda [name] is a 76 y.o. female admitted on 04/05/2017 with enterobacter UTI.  Pharmacy has been consulted for meropenem dosing (due to multiple allergies per previous notes) - day #5. Also continuing on vancomycin PO taper for cdiff. Afebrile, WBC wnl. SCr trending down, CrCl~28.  Plan: Meropenem 1gm IV q12hr Monitor c/s, renal function, LOT F/u ability to de-escalate or d/c antibiotics today?  Height: 5' 1.5" (156.2 cm) Weight: 205 lb 0.4 oz (93 kg) IBW/kg (Calculated) : 48.95  Temp (24hrs), Avg:97.9 F (36.6 C), Min:97.6 F (36.4 C), Max:98.3 F (36.8 C)   Recent Labs Lab 04/04/17 1120 04/05/17 1950 04/06/17 0540 04/07/17 0541 04/08/17 0713 04/09/17 0828 04/10/17 0242  WBC 6.6 7.9 7.5  --   --  9.6 7.9  CREATININE 2.81* 3.29* 3.06* 2.62* 2.33* 2.03* 1.84*  LATICACIDVEN  --  1.5  --   --   --   --   --     Estimated Creatinine Clearance: 28.2 mL/min (A) (by C-G formula based on SCr of 1.84 mg/dL (H)).    Allergies  Allergen Reactions  . Ciprofloxacin Shortness Of Breath    REACTION: sob,tachycardia  . Doxycycline Nausea Only    Also experienced diarrhea   . Fish Oil     Patient face drew to the side,Bells Palsey  . Guaifenesin Shortness Of Breath    REACTION: sob,tachycardia  . Mucinex [Guaifenesin Er] Shortness Of Breath  . Sulfamethoxazole W/Trimethoprim 800-160 [Sulfamethoxazole-Trimethoprim] Nausea Only    Also lack of appetite   . Uloric [Febuxostat] Swelling    No urination  . Adhesive [Tape] Other (See Comments)    Causes blisters on skin  . Allopurinol Other (See Comments)    Couldn't urinate  . Cefuroxime Axetil Swelling    Swelling all over body-per patient she was hospitalized as a result of taking this medication  . Metolazone Nausea Only    Swelling   . Tramadol     insomnia   Antimicrobials this admission: 7/14 mero >>  Vancomycin PO taper  Dose adjustments this admission:  Microbiology results: 7/12 and  7/13 UCx: Enterobacter aerogenes (R-cefaz) 7/10 cdiff: neg Thank you for allowing pharmacy to be a part of this patient's care.   Babs BertinHaley Anjolie Majer, PharmD, BCPS Clinical Pharmacist Rx Phone # for today: [phone] After 3:30PM, please call Main Rx: [phone] 04/10/2017 10:45 AM

## 2017-04-10 NOTE — Progress Notes (Signed)
Pt tranferred to 6E24 from 2 Central. Pt is alert and oriented x 4. Denies complaints of pain. Pt on low bed. Alarm initiated. Will continue to monitor.

## 2017-04-10 NOTE — Progress Notes (Signed)
Patient ID: Yolanda MurrainBrenda W [name], female   DOB: November 13, 1940, 76 y.o.   MRN: [id number]  PROGRESS NOTE    Yolanda MurrainBrenda W [name]  BJY:[id number] DOB: November 13, 1940 DOA: 04/05/2017  PCP: Kari BaarsHawkins, Edward, MD   Brief Narrative:  76 year old female with multiple UTI's over the past few weeks PTA, hospitalized at [facility] but [facility] for further care. Pt also had C.diff colitis and is being treated with vanco PO. Pt has had hyponatremia, renal failure and UTI. IN addition, she had worsening mental status changes and EEG done 7/17 which showed diffuse slowing of waking cycle.   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) / Hypomagnesemia - Cr 4.28 in June - Cr on this admission within baseline range  - Magnesium supplemented - Follow up magnesium and BMP in am  Hypernatremia - Due to CKD, dehydration - Sodium normalized subsequently as patient more hydrated   Paroxysmal A fib - CHADS vasc score 5 - On AC with apixaban - Rate controlled with Cardizem and metoprolol   Chronic diastolic CHF - CXR with vascular congestion - Gotten lasix 40 mg IV on 7/17 - Stable resp status  - ECHO in 12/2015 showed preserved EF, grade 2 DD  Diabetes mellitus with diabetic nephropathy - Continue SSI  Anemia of chronic kidney disease - Hgb stable  - Transfuse for hgb less than 7    DVT prophylaxis: On  Apixaban  Code Status: full code  Family Communication: no family at the bedside this am Disposition Plan: transfer to telemetry    Consultants:   PT  Procedures:   EEG 7/17 - diffuse slowing of the waking background, could indicated diffuse cerebral dysfunction  Antimicrobials:   Meropenem 7/14 -->  Vanco taper  7/14 -->   Subjective: No overnight events.  Objective: Vitals:   04/09/17 2046 04/09/17 2251 04/10/17 0315 04/10/17 0748  BP: (!) 99/42 (!) 92/43 (!) 101/49 (!) 115/58  Pulse: 93 95 87 90  Resp: 17 15 17 19   Temp: 98.3 F (36.8 C) 98.1 F (36.7 C) 97.7 F (36.5 C) 97.6 F (36.4 C)  TempSrc: Oral Oral Oral Oral  SpO2: 94% 93% 95% 94%  Weight:      Height:        Intake/Output Summary (Last 24 hours) at 04/10/17 0806 Last data filed at 04/10/17 [id number]  Gross per 24 hour  Intake              560 ml  Output              550 ml  Net               10 ml   Filed Weights   04/05/17 2350 04/07/17 0539 04/09/17 0100  Weight: 90.4 kg (199 lb 6.4 oz) 90.4 kg (199 lb 5.4 oz) 93 kg (205 lb 0.4 oz)    Examination:  General exam: Appears  calm and comfortable  Respiratory system: diminished breath sounds, no wheezing  Cardiovascular system: S1 & S2 heard, Rate controlled Gastrointestinal system: Abdomen is nondistended, soft and nontender. No organomegaly or masses felt. Normal bowel sounds heard. Central nervous system: No focal neurological deficits. Extremities: +1 LE edema Skin: warm, dry  Psychiatry: Normal mood and behavior   Data Reviewed: I have personally reviewed following labs and imaging studies  CBC:  Recent Labs Lab 04/04/17 1120 04/05/17 1950 04/06/17 0540 04/09/17 0828 04/10/17 0242  WBC 6.6 7.9 7.5 9.6 7.9  NEUTROABS  --  4.9  --   --   --   HGB 7.8* 8.7* 8.1* 7.8* 7.4*  HCT 23.6* 25.9* 24.4* 24.2* 22.3*  MCV 97.5 97.0 97.6 96.8 95.3  PLT 212 255 262 245 217   Basic Metabolic Panel:  Recent Labs Lab 04/06/17 0540  04/07/17 0541 04/08/17 0713 04/09/17 0828 04/10/17 0242  NA 138 144 150* 144 139  K 5.1 4.5 4.3 4.3 3.8  CL 109 114* 122* 117* 111  CO2 22 20* 21* 21* 20*  GLUCOSE 84 86 87 85 95  BUN 9 10 14 14 17   CREATININE 3.06* 2.62* 2.33* 2.03* 1.84*  CALCIUM 7.3* 7.4* 7.4* 7.4* 7.4*  MG  --   --   --  1.0*  --    GFR: Estimated Creatinine Clearance: 28.2 mL/min (A) (by C-G formula based on SCr of 1.84 mg/dL (H)). Liver Function Tests:  Recent Labs Lab 04/05/17 1950 04/09/17 0828  AST 81* 46*  ALT 38 24  ALKPHOS 187* 159*  BILITOT 1.7* 1.1  PROT 5.3* 4.6*  ALBUMIN 2.5* 2.1*    Recent Labs Lab 04/05/17 1950  LIPASE 16    Recent Labs Lab 04/09/17 0828  AMMONIA 78*   Coagulation Profile: No results for input(s): INR, PROTIME in the last 168 hours. Cardiac Enzymes:  Recent Labs Lab 04/05/17 1950  TROPONINI <0.03   BNP (last 3 results) No results for input(s): PROBNP in the last 8760 hours. HbA1C: No results for input(s): HGBA1C in the last 72 hours. CBG:  Recent Labs Lab 04/09/17 0754 04/09/17 1204 04/09/17 1649 04/09/17 2104 04/10/17 0750  GLUCAP 75 93 112* 108* 90   Lipid Profile: No results for input(s): CHOL, HDL, LDLCALC, TRIG, CHOLHDL, LDLDIRECT in the last 72 hours. Thyroid Function Tests: No results for input(s): TSH, T4TOTAL, FREET4, T3FREE, THYROIDAB in the last 72 hours. Anemia Panel: No results for input(s): VITAMINB12, FOLATE, FERRITIN, TIBC, IRON, RETICCTPCT in the last 72 hours. Urine analysis:    Component Value Date/Time   COLORURINE AMBER (A) 04/05/2017 1950   APPEARANCEUR TURBID (A) 04/05/2017 1950   LABSPEC 1.012 04/05/2017 1950   PHURINE 5.0 04/05/2017 1950   GLUCOSEU NEGATIVE 04/05/2017 1950   HGBUR SMALL (A) 04/05/2017 1950   BILIRUBINUR NEGATIVE 04/05/2017 1950   KETONESUR NEGATIVE 04/05/2017 1950   PROTEINUR 30 (A) 04/05/2017 1950   NITRITE NEGATIVE 04/05/2017 1950   LEUKOCYTESUR LARGE (A) 04/05/2017 1950   Sepsis  Labs: @LABRCNTIP (procalcitonin:4,lacticidven:4)    C difficile quick scan w PCR reflex     Status: None   Collection Time: 04/02/17  8:00 PM  Result Value Ref Range Status   C Diff antigen NEGATIVE NEGATIVE Final   C Diff toxin NEGATIVE NEGATIVE Final   C Diff interpretation No C. difficile detected.  Final  Culture, Urine     Status: Abnormal   Collection Time: 04/04/17  7:40 AM  Result Value Ref Range Status   Specimen Description  URINE, CATHETERIZED  Final   Report Status 04/07/2017 FINAL  Final   Organism ID, Bacteria ENTEROBACTER AEROGENES (A)  Final      Susceptibility   Enterobacter aerogenes - MIC*    CEFAZOLIN >=64 RESISTANT Resistant     CEFTRIAXONE <=1 SENSITIVE Sensitive     CIPROFLOXACIN <=0.25 SENSITIVE Sensitive     GENTAMICIN <=1 SENSITIVE Sensitive     IMIPENEM 1 SENSITIVE Sensitive     NITROFURANTOIN <=16 SENSITIVE Sensitive     TRIMETH/SULFA <=20 SENSITIVE Sensitive     PIP/TAZO <=4 SENSITIVE Sensitive     * >=100,000 COLONIES/mL ENTEROBACTER AEROGENES  Urine culture     Status: Abnormal   Collection Time: 04/05/17  7:50 PM  Result Value Ref Range Status   Specimen Description URINE, CATHETERIZED  Final   Special Requests NONE  Final   Report Status 04/08/2017 FINAL  Final   Organism ID, Bacteria ENTEROBACTER AEROGENES (A)  Final      Susceptibility   Enterobacter aerogenes - MIC*    CEFAZOLIN >=64 RESISTANT Resistant     CEFTRIAXONE <=1 SENSITIVE Sensitive     CIPROFLOXACIN <=0.25 SENSITIVE Sensitive     GENTAMICIN <=1 SENSITIVE Sensitive     IMIPENEM 2 SENSITIVE Sensitive     NITROFURANTOIN <=16 SENSITIVE Sensitive     TRIMETH/SULFA <=20 SENSITIVE Sensitive     PIP/TAZO <=4 SENSITIVE Sensitive     * >=100,000 COLONIES/mL ENTEROBACTER AEROGENES      Radiology Studies: Dg Chest Port 1 View Result Date: 04/08/2017  1. Cardiomegaly with mild vascular congestion. 2. Bibasilar densities, progressed compared to the prior radiograph. Pneumonia is not  excluded. Clinical correlation is recommended. Electronically Signed   By: Elgie Collard M.D.   On: 04/08/2017 04:20      Scheduled Meds: . albuterol  3 mL Inhalation QID  . apixaban  5 mg Oral BID  . calcium carbonate  1 tablet Oral BID  . diltiazem  120 mg Oral Daily  . feeding supplement (PRO-STAT SUGAR FREE 64)  30 mL Oral BID  . fluticasone furoate-vilanterol  1 puff Inhalation Daily  . gabapentin  200 mg Oral QHS  . insulin aspart  0-15 Units Subcutaneous TID WC  . insulin aspart  0-5 Units Subcutaneous QHS  . levothyroxine  137 mcg Oral QAC breakfast  . metoprolol tartrate  12.5 mg Oral BID  . sodium bicarbonate  650 mg Oral BID  . vancomycin  250 mg Oral BID   Continuous Infusions: . meropenem (MERREM) IV Stopped (04/09/17 2140)     LOS: 4 days    Time spent: 25 minutes  Greater than 50% of the time spent on counseling and coordinating the care.   Manson Passey, MD [facility] Pager [phone]  If 7PM-7AM, please contact night-coverage [email or web address] Password [email or web address] 04/10/2017, 8:06 AM

## 2017-04-11 LAB — BASIC METABOLIC PANEL
ANION GAP: 6 (ref 5–15)
BUN: 22 mg/dL — ABNORMAL HIGH (ref 6–20)
CALCIUM: 7.9 mg/dL — AB (ref 8.9–10.3)
CO2: 22 mmol/L (ref 22–32)
Chloride: 112 mmol/L — ABNORMAL HIGH (ref 101–111)
Creatinine, Ser: 2.06 mg/dL — ABNORMAL HIGH (ref 0.44–1.00)
GFR, EST AFRICAN AMERICAN: 26 mL/min — AB (ref >60)
GFR, EST NON AFRICAN AMERICAN: 23 mL/min — AB (ref >60)
Glucose, Bld: 94 mg/dL (ref 65–99)
POTASSIUM: 4.1 mmol/L (ref 3.5–5.1)
Sodium: 140 mmol/L (ref 135–145)

## 2017-04-11 LAB — CBC
HEMATOCRIT: 23.3 % — AB (ref 36.0–46.0)
HEMOGLOBIN: 7.6 g/dL — AB (ref 12.0–15.0)
MCH: 31.3 pg (ref 26.0–34.0)
MCHC: 32.6 g/dL (ref 30.0–36.0)
MCV: 95.9 fL (ref 78.0–100.0)
Platelets: 246 10*3/uL (ref 150–400)
RBC: 2.43 MIL/uL — AB (ref 3.87–5.11)
RDW: 16.7 % — AB (ref 11.5–15.5)
WBC: 8 10*3/uL (ref 4.0–10.5)

## 2017-04-11 LAB — MAGNESIUM: MAGNESIUM: 1.4 mg/dL — AB (ref 1.7–2.4)

## 2017-04-11 LAB — GLUCOSE, CAPILLARY
GLUCOSE-CAPILLARY: 99 mg/dL (ref 65–99)
Glucose-Capillary: 89 mg/dL (ref 65–99)
Glucose-Capillary: 108 mg/dL — ABNORMAL HIGH (ref 65–99)
Glucose-Capillary: 108 mg/dL — ABNORMAL HIGH (ref 65–99)

## 2017-04-11 LAB — ABO/RH: ABO/RH(D): A NEG

## 2017-04-11 LAB — PREPARE RBC (CROSSMATCH)

## 2017-04-11 MED ORDER — ALBUTEROL SULFATE (2.5 MG/3ML) 0.083% IN NEBU
3.0000 mL | INHALATION_SOLUTION | Freq: Three times a day (TID) | RESPIRATORY_TRACT | Status: DC
  Administered 2017-04-11 – 2017-04-12 (×5): 3 mL via RESPIRATORY_TRACT
  Filled 2017-04-11 (×5): qty 3

## 2017-04-11 MED ORDER — SODIUM CHLORIDE 0.9 % IV SOLN
Freq: Once | INTRAVENOUS | Status: AC
Start: 2017-04-11 — End: 2017-04-11
  Administered 2017-04-11: 16:00:00 via INTRAVENOUS

## 2017-04-11 MED ORDER — MAGNESIUM SULFATE 2 GM/50ML IV SOLN
2.0000 g | Freq: Once | INTRAVENOUS | Status: AC
  Administered 2017-04-11: 2 g via INTRAVENOUS
  Filled 2017-04-11: qty 50

## 2017-04-11 NOTE — Care Management Important Message (Signed)
Important Message  Patient Details  Name: Yolanda [name] MRN: [id number] Date of Birth: Jul 20, 1941   Medicare Important Message Given:  Yes    Tea Collums, Annamarie Majorheryl U, RN 04/11/2017, 2:17 PM

## 2017-04-11 NOTE — Clinical Social Work Note (Signed)
Clinical Social Work Assessment  Patient Details  Name: Yolanda [name] MRN: [id number] Date of Birth: November 16, 1940  Date of referral:  04/11/17               Reason for consult:  Facility Placement                Permission sought to share information with:  Chartered certified accountant granted to share information::  Yes, Verbal Permission Granted  Name::     Scientist, research (physical sciences)::  SNF  Relationship::  sister  Contact Information:     Housing/Transportation Living arrangements for the past 2 months:  [place] Information:  Patient, Facility, Other (Comment Required) (siblings) Patient Interpreter Needed:  None Criminal Activity/Legal Involvement Pertinent to Current Situation/Hospitalization:  No - Comment as needed Significant Relationships:  Siblings Lives with:  Facility Resident Do you feel safe going back to the place where you live?  No Need for family participation in patient care:  Yes (Comment) (help with decision making)  Care giving concerns:  Pt normally lives at home alone with outside support from sisters.  Pt has been at [facility] for total of about 72 days for rehab and has not improved enough to be safe at home.  Pt siblings maintain that pt has been too sick to improve with therapies- does not think pt could return home safely at this time nor could she live with other family members due to lack of physical support.   Social Worker assessment / plan:  CSW met with pt and sister to discuss discharge plan.  Pt quiet throughout most of interview and sister provided most of the information.    Pt was at [facility] for about 3 weeks and then returned to the hospital where she went to [facility] after DC.  Pt and family have been disapointed with care at facilities and are interested in alternative placement but do not know what facility they would prefer.    CSW discussed fact that they are almost at the end of medicare days- also  discussed that her managed plan has tried to stop payment twice now and they have won appeals for continued SNF stay.  CSW expressed concerns that pt will get denied for rehab stay.  Explained that pt choices at that time would be to return home with home services or go to a facility private pay until they became eligible for Medicaid.  Employment status:  Retired Nurse, adult PT Recommendations:  [place] / Referral to community resources:  [place]  Patient/Family's Response to care:  Family is agreeable to searching for alternative SNF placement but worried about what happens if they get denied.  Pt not interested in giving up her home or belongings at this time but also believes private pay at SNF would be unaffordable.  Patient/Family's Understanding of and Emotional Response to Diagnosis, Current Treatment, and Prognosis:  Unclear level of understanding- does not seem as if pt has made meaningful improvements over past months but family continues to think she can return to living independently in the future.  Emotional Assessment Appearance:  Appears stated age Attitude/Demeanor/Rapport:    Affect (typically observed):  Appropriate, Quiet Orientation:  Oriented to Self, Oriented to Place, Oriented to  Time, Oriented to Situation Alcohol / Substance use:  Not Applicable Psych involvement (Current and /or in the community):  No (Comment)  Discharge Needs  Concerns to be addressed:  Care Coordination Readmission within the last 30 days:  Yes Current discharge risk:  Physical Impairment Barriers to Discharge:  Continued Medical Work up   Jorge Ny, LCSW 04/11/2017, 4:40 PM

## 2017-04-11 NOTE — Evaluation (Signed)
Physical Therapy Evaluation Patient Details Name: Ashley MurrainBrenda W Markwood MRN: [id number] DOB: Jun 14, 1941 Today's Date: 04/11/2017   History of Present Illness  76 yo white female admitted for recurrent UTI and fragile skin with periph edema, recent c-diff, renal failure, low Na+, CHF and anemia related to CKD.  PMHx: SNF stay, falls, CHF, PAF, DM, PN, CKD, HTN, UTI's  Clinical Impression  Pt is getting up to attempt BSC transition but leg pain is too restrictive.  Pt is attempting mult times and spoke with nursing about maybe being able later to attempt.  Her plan is to follow acutely and will likely need to return to SNF as she is so debilitated compared to PLOF being home and independent.  Pt in agreement with the plan, and so proceeding to work on increasing mobility as tolerated, which may need to be with premedication.    Follow Up Recommendations SNF    Equipment Recommendations  None recommended by PT    Recommendations for Other Services       Precautions / Restrictions Precautions Precautions: Fall (telemetry) Restrictions Weight Bearing Restrictions: No Other Position/Activity Restrictions: pain LE's with standing      Mobility  Bed Mobility Overal bed mobility: Needs Assistance Bed Mobility: Supine to Sit;Sit to Supine Rolling: Min assist   Supine to sit: Mod assist Sit to supine: Max assist   General bed mobility comments: cued hand placement on bedrail to assist in and out of bed  Transfers Overall transfer level: Needs assistance Equipment used: Rolling walker (2 wheeled);1 person hand held assist Transfers: Sit to/from Stand Sit to Stand: Mod assist         General transfer comment: pt could not tolerate LE pain to step to chair  Ambulation/Gait             General Gait Details: unable to tolerate pain  Stairs            Wheelchair Mobility    Modified Rankin (Stroke Patients Only)       Balance Overall balance assessment: History of  Falls                                           Pertinent Vitals/Pain Pain Assessment: Faces Faces Pain Scale: Hurts even more Pain Location: lower legs Pain Descriptors / Indicators: Sore;Sharp Pain Intervention(s): Limited activity within patient's tolerance;Monitored during session;Premedicated before session;Repositioned    Home Living Family/patient expects to be discharged to:: Skilled nursing facility                      Prior Function Level of Independence: Independent with assistive device(s)         Comments: has declined from indep at home, drove and was able to walk with no AD in community     Hand Dominance   Dominant Hand: Right    Extremity/Trunk Assessment   Upper Extremity Assessment Upper Extremity Assessment: Generalized weakness    Lower Extremity Assessment Lower Extremity Assessment: Generalized weakness    Cervical / Trunk Assessment Cervical / Trunk Assessment: Normal  Communication   Communication: No difficulties  Cognition Arousal/Alertness: Awake/alert Behavior During Therapy: WFL for tasks assessed/performed Overall Cognitive Status: Within Functional Limits for tasks assessed                         Following Commands:  Follows one step commands with increased time              General Comments      Exercises     Assessment/Plan    PT Assessment Patient needs continued PT services  PT Problem List Decreased strength;Decreased range of motion;Decreased activity tolerance;Decreased balance;Decreased mobility;Decreased coordination;Decreased knowledge of use of DME;Decreased safety awareness;Cardiopulmonary status limiting activity;Obesity;Decreased skin integrity;Pain       PT Treatment Interventions DME instruction;Gait training;Functional mobility training;Therapeutic activities;Therapeutic exercise;Balance training;Neuromuscular re-education;Patient/family education    PT Goals  (Current goals can be found in the Care Plan section)  Acute Rehab PT Goals Patient Stated Goal: get her legs better and reduce pain PT Goal Formulation: With patient Time For Goal Achievement: 04/25/17 Potential to Achieve Goals: Fair    Frequency Min 2X/week   Barriers to discharge Inaccessible home environment;Decreased caregiver support has been independent with mobility    Co-evaluation               AM-PAC PT "6 Clicks" Daily Activity  Outcome Measure Difficulty turning over in bed (including adjusting bedclothes, sheets and blankets)?: Total Difficulty moving from lying on back to sitting on the side of the bed? : Total Difficulty sitting down on and standing up from a chair with arms (e.g., wheelchair, bedside commode, etc,.)?: Total Help needed moving to and from a bed to chair (including a wheelchair)?: Total Help needed walking in hospital room?: Total Help needed climbing 3-5 steps with a railing? : Total 6 Click Score: 6    End of Session Equipment Utilized During Treatment: Gait belt Activity Tolerance: Patient limited by pain;Patient limited by fatigue Patient left: in bed;with call bell/phone within reach;with bed alarm set Nurse Communication: Mobility status PT Visit Diagnosis: Unsteadiness on feet (R26.81);Pain;Muscle weakness (generalized) (M62.81) Pain - Right/Left:  (B feet) Pain - part of body: Ankle and joints of foot    Time: 1610-9604 PT Time Calculation (min) (ACUTE ONLY): 26 min   Charges:   PT Evaluation $PT Eval Moderate Complexity: 1 Procedure PT Treatments $Therapeutic Activity: 8-22 mins   PT G Codes:   PT G-Codes **NOT FOR INPATIENT CLASS** Functional Assessment Tool Used: AM-PAC 6 Clicks Basic Mobility    Ivar Drape 04/11/2017, 1:46 PM   Samul Dada, PT MS Acute Rehab Dept. Number: [facility] [id number] and [facility] [phone]

## 2017-04-11 NOTE — [place] FL2 (Signed)
[place] MEDICAID FL2 LEVEL OF CARE SCREENING TOOL     IDENTIFICATION  Patient Name: Yolanda [name] Birthdate: 01/18/1941 Sex: female Admission Date (Current Location): 04/05/2017  [facility] and [place] Number:  [employer] and Address:  The [place], [address] [place], [place] [address]      Provider Number: [id number]  Attending Physician Name and Address:  Alison Murray, MD  Relative Name and Phone Number:       Current Level of Care: Hospital Recommended Level of Care: Skilled Nursing Facility Prior Approval Number:    Date Approved/Denied:   PASRR Number: [id number]  Discharge Plan: SNF    Current Diagnoses: Patient Active Problem List   Diagnosis Date Noted  . Delirium 04/05/2017  . UTI (urinary tract infection) 04/05/2017  . Diarrhea 03/12/2017  . Schatzki's ring 02/06/2017  . Gastritis 02/06/2017  . Esophageal dysphagia   . Elevated LFTs 01/28/2017  . Diabetes mellitus type 2 in obese (HCC) 01/28/2017  . Metabolic encephalopathy 01/01/2017  . Acute encephalopathy 12/30/2016  . Acute lower UTI 12/30/2016  . Pain, dental 12/30/2016  . Spasm of back muscles 12/30/2016  . Hyperglycemia 12/30/2016  . Gout attack 09/07/2016  . Chronic anticoagulation 02/02/2016  . Chronic edema 02/02/2016  . Obesity 02/02/2016  . Sleep apnea 02/02/2016  . RBBB 02/02/2016  . Acute on chronic diastolic heart failure (HCC) 01/13/2016  . Hyponatremia 01/06/2016  . AKI (acute kidney injury) (HCC) 01/06/2016  . Cellulitis of left lower extremity 12/15/2014  . PAF (paroxysmal atrial fibrillation) (HCC) 11/09/2014  . COPD (chronic obstructive pulmonary disease) (HCC) 11/06/2014  . Neuropathic pain 11/06/2014  . Hypothyroidism 11/06/2014  . Hyperkalemia 11/06/2014  . HTN (hypertension) 01/12/2014  . Upper airway cough syndrome 09/03/2013  . GERD (gastroesophageal reflux disease) 11/13/2011  . Short-segment Barrett's esophagus  11/13/2011    Orientation RESPIRATION BLADDER Height & Weight     Self, Time, Situation, Place  Normal Continent Weight: 218 lb (98.9 kg) Height:  5' 1.5" (156.2 cm)  BEHAVIORAL SYMPTOMS/MOOD NEUROLOGICAL BOWEL NUTRITION STATUS      Continent Diet (carb modified)  AMBULATORY STATUS COMMUNICATION OF NEEDS Skin   Extensive Assist Verbally Normal                       Personal Care Assistance Level of Assistance  Bathing, Dressing Bathing Assistance: Maximum assistance Feeding assistance: Independent Dressing Assistance: Maximum assistance     Functional Limitations Info             SPECIAL CARE FACTORS FREQUENCY  PT (By licensed PT), OT (By licensed OT)     PT Frequency: 5/wk OT Frequency: 5/wk            Contractures      Additional Factors Info  Code Status, Allergies, Isolation Precautions Code Status Info: FULL Allergies Info: Ciprofloxacin, Doxycycline, Fish Oil, Guaifenesin, Mucinex Guaifenesin Er, Sulfamethoxazole W/trimethoprim 800-160 Sulfamethoxazole-trimethoprim, Uloric Febuxostat, Adhesive Tape, Allopurinol, Cefuroxime Axetil, Metolazone, Tramadol     Isolation Precautions Info: Enteric     Current Medications (04/11/2017):  This is the current hospital active medication list Current Facility-Administered Medications  Medication Dose Route Frequency Provider Last Rate Last Dose  . 0.9 %  sodium chloride infusion   Intravenous Once Alison Murray, MD      . acetaminophen (TYLENOL) tablet 650 mg  650 mg Oral Q6H PRN Houston Siren, MD   650 mg at 04/10/17 1631   Or  .  acetaminophen (TYLENOL) suppository 650 mg  650 mg Rectal Q6H PRN Houston SirenLe, Peter, MD      . acetaminophen (TYLENOL) tablet 650 mg  650 mg Oral Q6H PRN Houston SirenLe, Peter, MD      . albuterol (PROVENTIL) (2.5 MG/3ML) 0.083% nebulizer solution 3 mL  3 mL Inhalation TID Alison Murrayevine, Alma M, MD      . apixaban Everlene Balls(ELIQUIS) tablet 5 mg  5 mg Oral BID Houston SirenLe, Peter, MD   5 mg at 04/11/17 0910  . calcium carbonate (TUMS  - dosed in mg elemental calcium) chewable tablet 200 mg of elemental calcium  1 tablet Oral BID Houston SirenLe, Peter, MD   200 mg of elemental calcium at 04/11/17 0909  . diltiazem (CARDIZEM CD) 24 hr capsule 120 mg  120 mg Oral Daily Houston SirenLe, Peter, MD   120 mg at 04/11/17 1000  . feeding supplement (PRO-STAT SUGAR FREE 64) liquid 30 mL  30 mL Oral BID Kari BaarsHawkins, Edward, MD   30 mL at 04/11/17 0909  . fluticasone furoate-vilanterol (BREO ELLIPTA) 200-25 MCG/INH 1 puff  1 puff Inhalation Daily Houston SirenLe, Peter, MD   1 puff at 04/10/17 0920  . gabapentin (NEURONTIN) capsule 200 mg  200 mg Oral QHS Kari BaarsHawkins, Edward, MD   200 mg at 04/10/17 2332  . haloperidol lactate (HALDOL) injection 5 mg  5 mg Intravenous Q6H PRN Kari BaarsHawkins, Edward, MD   5 mg at 04/07/17 1714  . insulin aspart (novoLOG) injection 0-15 Units  0-15 Units Subcutaneous TID WC Houston SirenLe, Peter, MD   2 Units at 04/10/17 1305  . insulin aspart (novoLOG) injection 0-5 Units  0-5 Units Subcutaneous QHS Houston SirenLe, Peter, MD      . levothyroxine (SYNTHROID, LEVOTHROID) tablet 137 mcg  137 mcg Oral QAC breakfast Houston SirenLe, Peter, MD   137 mcg at 04/11/17 0909  . magnesium sulfate IVPB 2 g 50 mL  2 g Intravenous Once Alison Murrayevine, Alma M, MD      . metoprolol tartrate (LOPRESSOR) tablet 12.5 mg  12.5 mg Oral BID Houston SirenLe, Peter, MD   12.5 mg at 04/11/17 0909  . sodium bicarbonate tablet 650 mg  650 mg Oral BID Houston SirenLe, Peter, MD   650 mg at 04/11/17 0909  . vancomycin (VANCOCIN) 50 mg/mL oral solution 250 mg  250 mg Oral BID Houston SirenLe, Peter, MD   250 mg at 04/11/17 [id number]     Discharge Medications: Please see discharge summary for a list of discharge medications.  Relevant Imaging Results:  Relevant Lab Results:   Additional Information SSN [id number]      Monice Lundy, [place], [place]

## 2017-04-11 NOTE — Progress Notes (Signed)
Patient ID: Yolanda [name], female   DOB: October 24, 1940, 76 y.o.   MRN: [id number]  PROGRESS NOTE    Yolanda [name]  WUJ:[id number] DOB: October 24, 1940 DOA: 04/05/2017  PCP: Kari BaarsHawkins, Edward, MD   Brief Narrative:  76 year old female with multiple UTI's over the past few weeks PTA, [facility] for [facility] care. Pt also had C.diff colitis and is being treated with vanco PO. Pt has had hyponatremia, renal failure and UTI. IN addition, she had worsening mental status changes and EEG done 7/17 which showed diffuse slowing of waking cycle.   Assessment & Plan:   Principal Problem: Acute delirium / Acute metabolic encephalopathy - Secondary to UTI and delirium - EEG done 7/17 showed diffuse slowing of waking cycle - CT head - no acute intracranial abnormalities - No significant changes in mental status since yesterday  - Awaiting PT eval  Active Problems: Enterobacter aerogenes UTI - Stopped meropenem 7/18  C.diff colitis - Continue vanco PO taper   Essential hypertension - Continue metoprolol and Cardizem   Hypothyroidism - Continue synthroid   Acute renal failure superimposed on CKD stage 4 (HCC) / Hypomagnesemia - Cr 4.28 in 02/2017 - Cr within baseline range  - Supplemented magnesium this am - Follow up BMP and Mag level in am  Hypernatremia - Due to CKD - Sodium subsequently normalized   Paroxysmal A fib - CHADS vasc score 5 - On Apixaban - Continue Cardizem and metoprolol for rate control   Chronic diastolic CHF - CXR with vascular congestion  - Gotten lasix 40 mg IV on 7/17 - ECHO in 12/2015 showed preserved EF, grade 2 DD - Resp status stable   Diabetes mellitus with diabetic nephropathy - Continue SSI  Anemia of chronic kidney disease - Transfuse 1 U PRBC this am - Follow up BMP in am    DVT prophylaxis: On apixaban Code Status: full code  Family Communication: family not at the bedside this  am Disposition Plan: PT eval pending    Consultants:   PT  Procedures:   EEG 7/17 - diffuse slowing of the waking background, could indicated diffuse cerebral dysfunction  Antimicrobials:   Meropenem 7/14 --> 7/18  Vanco taper  7/14 -->   Subjective: No overnight events.  Objective: Vitals:   04/10/17 2234 04/11/17 0414 04/11/17 0847 04/11/17 0900  BP: (!) 110/41 (!) 135/57  (!) 144/62  Pulse: 96 85    Resp: 18 17    Temp: 97.6 F (36.4 C) 97.8 F (36.6 C)    TempSrc: Oral Oral    SpO2: 95% 94% 95%   Weight: 98.9 kg (218 lb)     Height:        Intake/Output Summary (Last 24 hours) at 04/11/17 0951 Last data filed at 04/11/17 0414  Gross per 24 hour  Intake              585 ml  Output              252 ml  Net              333 ml   Filed Weights   04/07/17 0539 04/09/17 0100 04/10/17 2234  Weight: 90.4 kg (199 lb 5.4 oz) 93 kg (205 lb 0.4 oz) 98.9 kg (218 lb)    Examination:  Physical Exam  Constitutional: Appears well-developed and well-nourished. No distress.  CVS: Rate controlled, S1/S2 + Pulmonary: diminished breath sounds, coarse breath sounds.  Abdominal: Soft. BS +,  no distension, tenderness, rebound or guarding.  Musculoskeletal: +1 edema in LE and no tenderness.  Lymphadenopathy: No lymphadenopathy noted, cervical, inguinal. Neuro: Alert. No cranial nerve deficit. Skin: Skin is warm and dry.  Psychiatric: Normal mood and affect.    Data Reviewed: I have personally reviewed following labs and imaging studies  CBC:  Recent Labs Lab 04/05/17 1950 04/06/17 0540 04/09/17 0828 04/10/17 0242 04/11/17 0528  WBC 7.9 7.5 9.6 7.9 8.0  NEUTROABS 4.9  --   --   --   --   HGB 8.7* 8.1* 7.8* 7.4* 7.6*  HCT 25.9* 24.4* 24.2* 22.3* 23.3*  MCV 97.0 97.6 96.8 95.3 95.9  PLT 255 262 245 217 246   Basic Metabolic Panel:  Recent Labs Lab 04/07/17 0541 04/08/17 0713 04/09/17 0828 04/10/17 0242 04/11/17 0528  NA 144 150* 144 139 140  K 4.5  4.3 4.3 3.8 4.1  CL 114* 122* 117* 111 112*  CO2 20* 21* 21* 20* 22  GLUCOSE 86 87 85 95 94  BUN 10 14 14 17  22*  CREATININE 2.62* 2.33* 2.03* 1.84* 2.06*  CALCIUM 7.4* 7.4* 7.4* 7.4* 7.9*  MG  --   --  1.0*  --  1.4*   GFR: Estimated Creatinine Clearance: 26.1 mL/min (A) (by C-G formula based on SCr of 2.06 mg/dL (H)). Liver Function Tests:  Recent Labs Lab 04/05/17 1950 04/09/17 0828  AST 81* 46*  ALT 38 24  ALKPHOS 187* 159*  BILITOT 1.7* 1.1  PROT 5.3* 4.6*  ALBUMIN 2.5* 2.1*    Recent Labs Lab 04/05/17 1950  LIPASE 16    Recent Labs Lab 04/09/17 0828  AMMONIA 78*   Coagulation Profile: No results for input(s): INR, PROTIME in the last 168 hours. Cardiac Enzymes:  Recent Labs Lab 04/05/17 1950  TROPONINI <0.03   BNP (last 3 results) No results for input(s): PROBNP in the last 8760 hours. HbA1C: No results for input(s): HGBA1C in the last 72 hours. CBG:  Recent Labs Lab 04/10/17 0750 04/10/17 1220 04/10/17 1704 04/10/17 2247 04/11/17 0740  GLUCAP 90 131* 118* 129* 89   Lipid Profile: No results for input(s): CHOL, HDL, LDLCALC, TRIG, CHOLHDL, LDLDIRECT in the last 72 hours. Thyroid Function Tests: No results for input(s): TSH, T4TOTAL, FREET4, T3FREE, THYROIDAB in the last 72 hours. Anemia Panel: No results for input(s): VITAMINB12, FOLATE, FERRITIN, TIBC, IRON, RETICCTPCT in the last 72 hours. Urine analysis:    Component Value Date/Time   COLORURINE AMBER (A) 04/05/2017 1950   APPEARANCEUR TURBID (A) 04/05/2017 1950   LABSPEC 1.012 04/05/2017 1950   PHURINE 5.0 04/05/2017 1950   GLUCOSEU NEGATIVE 04/05/2017 1950   HGBUR SMALL (A) 04/05/2017 1950   BILIRUBINUR NEGATIVE 04/05/2017 1950   KETONESUR NEGATIVE 04/05/2017 1950   PROTEINUR 30 (A) 04/05/2017 1950   NITRITE NEGATIVE 04/05/2017 1950   LEUKOCYTESUR LARGE (A) 04/05/2017 1950   Sepsis Labs: @LABRCNTIP (procalcitonin:4,lacticidven:4)    C difficile quick scan w PCR reflex      Status: None   Collection Time: 04/02/17  8:00 PM  Result Value Ref Range Status   C Diff antigen NEGATIVE NEGATIVE Final   C Diff toxin NEGATIVE NEGATIVE Final   C Diff interpretation No C. difficile detected.  Final  Culture, Urine     Status: Abnormal   Collection Time: 04/04/17  7:40 AM  Result Value Ref Range Status   Specimen Description URINE, CATHETERIZED  Final   Report Status 04/07/2017 FINAL  Final  Organism ID, Bacteria ENTEROBACTER AEROGENES (A)  Final      Susceptibility   Enterobacter aerogenes - MIC*    CEFAZOLIN >=64 RESISTANT Resistant     CEFTRIAXONE <=1 SENSITIVE Sensitive     CIPROFLOXACIN <=0.25 SENSITIVE Sensitive     GENTAMICIN <=1 SENSITIVE Sensitive     IMIPENEM 1 SENSITIVE Sensitive     NITROFURANTOIN <=16 SENSITIVE Sensitive     TRIMETH/SULFA <=20 SENSITIVE Sensitive     PIP/TAZO <=4 SENSITIVE Sensitive     * >=100,000 COLONIES/mL ENTEROBACTER AEROGENES  Urine culture     Status: Abnormal   Collection Time: 04/05/17  7:50 PM  Result Value Ref Range Status   Specimen Description URINE, CATHETERIZED  Final   Special Requests NONE  Final   Report Status 04/08/2017 FINAL  Final   Organism ID, Bacteria ENTEROBACTER AEROGENES (A)  Final      Susceptibility   Enterobacter aerogenes - MIC*    CEFAZOLIN >=64 RESISTANT Resistant     CEFTRIAXONE <=1 SENSITIVE Sensitive     CIPROFLOXACIN <=0.25 SENSITIVE Sensitive     GENTAMICIN <=1 SENSITIVE Sensitive     IMIPENEM 2 SENSITIVE Sensitive     NITROFURANTOIN <=16 SENSITIVE Sensitive     TRIMETH/SULFA <=20 SENSITIVE Sensitive     PIP/TAZO <=4 SENSITIVE Sensitive     * >=100,000 COLONIES/mL ENTEROBACTER AEROGENES      Radiology Studies: Dg Chest Port 1 View Result Date: 04/08/2017  1. Cardiomegaly with mild vascular congestion. 2. Bibasilar densities, progressed compared to the prior radiograph. Pneumonia is not excluded. Clinical correlation is recommended. Electronically Signed   By: Elgie Collard M.D.    On: 04/08/2017 04:20      Scheduled Meds: . albuterol  3 mL Inhalation TID  . apixaban  5 mg Oral BID  . calcium carbonate  1 tablet Oral BID  . diltiazem  120 mg Oral Daily  . feeding supplement (PRO-STAT SUGAR FREE 64)  30 mL Oral BID  . fluticasone furoate-vilanterol  1 puff Inhalation Daily  . gabapentin  200 mg Oral QHS  . insulin aspart  0-15 Units Subcutaneous TID WC  . insulin aspart  0-5 Units Subcutaneous QHS  . levothyroxine  137 mcg Oral QAC breakfast  . metoprolol tartrate  12.5 mg Oral BID  . sodium bicarbonate  650 mg Oral BID  . vancomycin  250 mg Oral BID   Continuous Infusions: . sodium chloride       LOS: 5 days    Time spent: 25 minutes  Greater than 50% of the time spent on counseling and coordinating the care.   Manson Passey, MD [facility] Pager [phone]  If 7PM-7AM, please contact night-coverage [email or web address] Password [email or web address] 04/11/2017, 9:51 AM

## 2017-04-12 LAB — BASIC METABOLIC PANEL
Anion gap: 7 (ref 5–15)
BUN: 27 mg/dL — AB (ref 6–20)
CO2: 21 mmol/L — ABNORMAL LOW (ref 22–32)
CREATININE: 1.96 mg/dL — AB (ref 0.44–1.00)
Calcium: 8.4 mg/dL — ABNORMAL LOW (ref 8.9–10.3)
Chloride: 112 mmol/L — ABNORMAL HIGH (ref 101–111)
GFR calc Af Amer: 28 mL/min — ABNORMAL LOW (ref >60)
GFR, EST NON AFRICAN AMERICAN: 24 mL/min — AB (ref >60)
GLUCOSE: 88 mg/dL (ref 65–99)
Potassium: 3.9 mmol/L (ref 3.5–5.1)
SODIUM: 140 mmol/L (ref 135–145)

## 2017-04-12 LAB — TYPE AND SCREEN
ABO/RH(D): A NEG
Antibody Screen: NEGATIVE
UNIT DIVISION: 0

## 2017-04-12 LAB — CBC
HCT: 26.2 % — ABNORMAL LOW (ref 36.0–46.0)
Hemoglobin: 8.7 g/dL — ABNORMAL LOW (ref 12.0–15.0)
MCH: 31.4 pg (ref 26.0–34.0)
MCHC: 33.2 g/dL (ref 30.0–36.0)
MCV: 94.6 fL (ref 78.0–100.0)
PLATELETS: 249 10*3/uL (ref 150–400)
RBC: 2.77 MIL/uL — ABNORMAL LOW (ref 3.87–5.11)
RDW: 17.5 % — AB (ref 11.5–15.5)
WBC: 8.4 10*3/uL (ref 4.0–10.5)

## 2017-04-12 LAB — MAGNESIUM: MAGNESIUM: 1.9 mg/dL (ref 1.7–2.4)

## 2017-04-12 LAB — BPAM RBC
Blood Product Expiration Date: 201807242359
ISSUE DATE / TIME: 201807191700
UNIT TYPE AND RH: 600

## 2017-04-12 LAB — GLUCOSE, CAPILLARY
GLUCOSE-CAPILLARY: 105 mg/dL — AB (ref 65–99)
GLUCOSE-CAPILLARY: 114 mg/dL — AB (ref 65–99)
Glucose-Capillary: 88 mg/dL (ref 65–99)

## 2017-04-12 MED ORDER — ALBUTEROL SULFATE (2.5 MG/3ML) 0.083% IN NEBU
3.0000 mL | INHALATION_SOLUTION | Freq: Two times a day (BID) | RESPIRATORY_TRACT | Status: DC
  Administered 2017-04-13 – 2017-04-17 (×10): 3 mL via RESPIRATORY_TRACT
  Filled 2017-04-12 (×10): qty 3

## 2017-04-12 NOTE — Progress Notes (Signed)
Patient ID: JYRAH BLYE, female   DOB: Aug 06, 1941, 76 y.o.   MRN: [id number]  PROGRESS NOTE    Yolanda [name]  WUJ:[id number] DOB: 10-11-1940 DOA: 04/05/2017  PCP: Kari Baars, MD   Brief Narrative:  76 year old female with multiple UTI's over the past few weeks PTA, hospitalized at [facility] but subsequently transferred to [facility] per family request for further care. Pt also had C.diff colitis and is being treated with vanco PO. Pt has had hyponatremia, renal failure and UTI. IN addition, she had worsening mental status changes and EEG done 7/17 which showed diffuse slowing of waking cycle.   Assessment & Plan:    Principal Problem: Acute delirium / Acute metabolic encephalopathy - Secondary to UTI and delirium - EEG done 7/17 showed diffuse slowing of waking cycle - CT head - no acute intracranial abnormalities - Per PT - SNF recommended, appreciate SW assistance with placement   Active Problems: Enterobacter aerogenes UTI - Stopped meropenem 7/18  C.diff colitis - Vanco taper - 125 mg BID for 7 days, 125 mg daily for next 7 days and then 125 mg every 48 hours for following 7 days and 125 mg every 3 days fir 14 days.  Essential hypertension - Continue metoprolol and Cardizem   Hypothyroidism - Continue synthroid   Acute renal failure superimposed on CKD stage 4 (HCC) / Hypomagnesemia - Cr 4.28 in 02/2017 - Cr within baseline range  - Supplemented and WNL  Hypernatremia - Due to CKD - Sodium WNL  Paroxysmal A fib - CHADS vasc score 5 - On Apixaban - Cardizem and metoprolol for rate control   Chronic diastolic CHF - CXR with vascular congestion  - Gotten lasix 40 mg IV on 7/17 - ECHO in 12/2015 showed preserved EF, grade 2 DD - Stable resp status this am  Diabetes mellitus with diabetic nephropathy - Continue SSI - CBG's in past 24 hours: 99, 108, 88  Anemia of chronic kidney disease - Transfused 1 U PRBC 7/19 - Hgb stable this an   DVT prophylaxis:  On apixaban  Code Status: full code  Family Communication: spoke with pt sister, HCPOA Disposition Plan: SNF versus home with HH in next 24 hours; repeat Cr in am and hgb and if stable then okay for d/c   Consultants:   PT  Procedures:   EEG 7/17 - diffuse slowing of the waking background, could indicated diffuse cerebral dysfunction  Antimicrobials:   Meropenem 7/14 --> 7/18  Vanco taper  7/14 -->   Subjective: No overnight events.  Objective: Vitals:   04/11/17 1900 04/11/17 2105 04/12/17 0634 04/12/17 0837  BP:  (!) 116/52 (!) 125/57   Pulse:  86 85   Resp:  18 18   Temp:  98 F (36.7 C) 97.7 F (36.5 C)   TempSrc:  Oral Oral   SpO2: 94% 94% 93% 96%  Weight:  95.3 kg (210 lb)    Height:        Intake/Output Summary (Last 24 hours) at 04/12/17 1151 Last data filed at 04/12/17 0925  Gross per 24 hour  Intake              885 ml  Output              601 ml  Net              284 ml   Filed Weights   04/09/17 0100 04/10/17 2234 04/11/17 2105  Weight: 93 kg (205 lb  0.4 oz) 98.9 kg (218 lb) 95.3 kg (210 lb)    Examination:  General exam: Appears calm and comfortable  Respiratory system: Diminished breath sounds, no wheezing  Cardiovascular system: S1 & S2 heard, RRR.  Gastrointestinal system: Abdomen is nondistended, soft and nontender. No organomegaly or masses felt. Normal bowel sounds heard. Central nervous system:No focal neurological deficits. Extremities: +1 LE edema, palpable pulses  Skin: warm, dry Psychiatry: No agitation or restlessness   Data Reviewed: I have personally reviewed following labs and imaging studies  CBC:  Recent Labs Lab 04/05/17 1950 04/06/17 0540 04/09/17 0828 04/10/17 0242 04/11/17 0528 04/12/17 0427  WBC 7.9 7.5 9.6 7.9 8.0 8.4  NEUTROABS 4.9  --   --   --   --   --   HGB 8.7* 8.1* 7.8* 7.4* 7.6* 8.7*  HCT 25.9* 24.4* 24.2* 22.3* 23.3* 26.2*  MCV 97.0 97.6 96.8 95.3 95.9 94.6  PLT 255 262 245 217 246 249    Basic Metabolic Panel:  Recent Labs Lab 04/08/17 0713 04/09/17 0828 04/10/17 0242 04/11/17 0528 04/12/17 0427  NA 150* 144 139 140 140  K 4.3 4.3 3.8 4.1 3.9  CL 122* 117* 111 112* 112*  CO2 21* 21* 20* 22 21*  GLUCOSE 87 85 95 94 88  BUN 14 14 17  22* 27*  CREATININE 2.33* 2.03* 1.84* 2.06* 1.96*  CALCIUM 7.4* 7.4* 7.4* 7.9* 8.4*  MG  --  1.0*  --  1.4* 1.9   GFR: Estimated Creatinine Clearance: 26.8 mL/min (A) (by C-G formula based on SCr of 1.96 mg/dL (H)). Liver Function Tests:  Recent Labs Lab 04/05/17 1950 04/09/17 0828  AST 81* 46*  ALT 38 24  ALKPHOS 187* 159*  BILITOT 1.7* 1.1  PROT 5.3* 4.6*  ALBUMIN 2.5* 2.1*    Recent Labs Lab 04/05/17 1950  LIPASE 16    Recent Labs Lab 04/09/17 0828  AMMONIA 78*   Coagulation Profile: No results for input(s): INR, PROTIME in the last 168 hours. Cardiac Enzymes:  Recent Labs Lab 04/05/17 1950  TROPONINI <0.03   BNP (last 3 results) No results for input(s): PROBNP in the last 8760 hours. HbA1C: No results for input(s): HGBA1C in the last 72 hours. CBG:  Recent Labs Lab 04/11/17 0740 04/11/17 1158 04/11/17 1742 04/11/17 2101 04/12/17 0732  GLUCAP 89 108* 99 108* 88   Lipid Profile: No results for input(s): CHOL, HDL, LDLCALC, TRIG, CHOLHDL, LDLDIRECT in the last 72 hours. Thyroid Function Tests: No results for input(s): TSH, T4TOTAL, FREET4, T3FREE, THYROIDAB in the last 72 hours. Anemia Panel: No results for input(s): VITAMINB12, FOLATE, FERRITIN, TIBC, IRON, RETICCTPCT in the last 72 hours. Urine analysis:    Component Value Date/Time   COLORURINE AMBER (A) 04/05/2017 1950   APPEARANCEUR TURBID (A) 04/05/2017 1950   LABSPEC 1.012 04/05/2017 1950   PHURINE 5.0 04/05/2017 1950   GLUCOSEU NEGATIVE 04/05/2017 1950   HGBUR SMALL (A) 04/05/2017 1950   BILIRUBINUR NEGATIVE 04/05/2017 1950   KETONESUR NEGATIVE 04/05/2017 1950   PROTEINUR 30 (A) 04/05/2017 1950   NITRITE NEGATIVE 04/05/2017  1950   LEUKOCYTESUR LARGE (A) 04/05/2017 1950   Sepsis Labs: @LABRCNTIP (procalcitonin:4,lacticidven:4)   Recent Results (from the past 240 hour(s))  C difficile quick scan w PCR reflex     Status: None   Collection Time: 04/02/17  8:00 PM  Result Value Ref Range Status   C Diff antigen NEGATIVE NEGATIVE Final   C Diff toxin NEGATIVE NEGATIVE Final   C Diff interpretation No  C. difficile detected.  Final  Culture, Urine     Status: Abnormal   Collection Time: 04/04/17  7:40 AM  Result Value Ref Range Status   Specimen Description URINE, CATHETERIZED  Final   Special Requests NONE  Final   Culture >=100,000 COLONIES/mL ENTEROBACTER AEROGENES (A)  Final   Report Status 04/07/2017 FINAL  Final   Organism ID, Bacteria ENTEROBACTER AEROGENES (A)  Final      Susceptibility   Enterobacter aerogenes - MIC*    CEFAZOLIN >=64 RESISTANT Resistant     CEFTRIAXONE <=1 SENSITIVE Sensitive     CIPROFLOXACIN <=0.25 SENSITIVE Sensitive     GENTAMICIN <=1 SENSITIVE Sensitive     IMIPENEM 1 SENSITIVE Sensitive     NITROFURANTOIN <=16 SENSITIVE Sensitive     TRIMETH/SULFA <=20 SENSITIVE Sensitive     PIP/TAZO <=4 SENSITIVE Sensitive     * >=100,000 COLONIES/mL ENTEROBACTER AEROGENES  Urine culture     Status: Abnormal   Collection Time: 04/05/17  7:50 PM  Result Value Ref Range Status   Specimen Description URINE, CATHETERIZED  Final   Special Requests NONE  Final   Culture >=100,000 COLONIES/mL ENTEROBACTER AEROGENES (A)  Final   Report Status 04/08/2017 FINAL  Final   Organism ID, Bacteria ENTEROBACTER AEROGENES (A)  Final      Susceptibility   Enterobacter aerogenes - MIC*    CEFAZOLIN >=64 RESISTANT Resistant     CEFTRIAXONE <=1 SENSITIVE Sensitive     CIPROFLOXACIN <=0.25 SENSITIVE Sensitive     GENTAMICIN <=1 SENSITIVE Sensitive     IMIPENEM 2 SENSITIVE Sensitive     NITROFURANTOIN <=16 SENSITIVE Sensitive     TRIMETH/SULFA <=20 SENSITIVE Sensitive     PIP/TAZO <=4 SENSITIVE  Sensitive     * >=100,000 COLONIES/mL ENTEROBACTER AEROGENES      Radiology Studies: No results found.      Scheduled Meds: . albuterol  3 mL Inhalation TID  . apixaban  5 mg Oral BID  . calcium carbonate  1 tablet Oral BID  . diltiazem  120 mg Oral Daily  . feeding supplement (PRO-STAT SUGAR FREE 64)  30 mL Oral BID  . fluticasone furoate-vilanterol  1 puff Inhalation Daily  . gabapentin  200 mg Oral QHS  . insulin aspart  0-15 Units Subcutaneous TID WC  . insulin aspart  0-5 Units Subcutaneous QHS  . levothyroxine  137 mcg Oral QAC breakfast  . metoprolol tartrate  12.5 mg Oral BID  . sodium bicarbonate  650 mg Oral BID  . vancomycin  250 mg Oral BID   Continuous Infusions:   LOS: 6 days    Time spent: 25 minutes Greater than 50% of the time spent on counseling and coordinating the care.   Manson Passey, MD [facility] Pager [phone]  If 7PM-7AM, please contact night-coverage [email or web address] Password [email or web address] 04/12/2017, 11:51 AM

## 2017-04-12 NOTE — Discharge Instructions (Signed)
Vancomycin oral solution What is this medicine? VANCOMYCIN Lucianne Lei koe MYE sin) is a glycopeptide antibiotic. It is used to treat certain kinds of bacterial infections in the bowel. It will not work for colds, flu, or other viral infections. This medicine may be used for other purposes; ask your health care provider or pharmacist if you have questions. COMMON BRAND NAME(S): Vancocin What should I tell my health care provider before I take this medicine? They need to know if you have any of these conditions: -dehydration -inflammatory bowel disease -kidney disease -other chronic illness -an unusual or allergic reaction to vancomycin, other medicines, foods, dyes, or preservatives -pregnant or trying to get pregnant -breast-feeding How should I use this medicine? Take this medicine by mouth. Follow the directions on the prescription label. Shake well before using. Use a specially marked spoon or dropper to measure each dose. Ask your pharmacist if you do not have one. Household spoons are not accurate. Take your medicine at regular intervals. Do not take your medicine more often than directed. Take all of your medicine as directed even if you think you are better. Do not skip doses or stop your medicine early. Talk to your pediatrician regarding the use of this medicine in children. Special care may be needed. Overdosage: If you think you have taken too much of this medicine contact a poison control center or emergency room at once. NOTE: This medicine is only for you. Do not share this medicine with others. What if I miss a dose? If you miss a dose, take it as soon as you can. If it is almost time for your next dose, take only that dose. Do not take double or extra doses. What may interact with this medicine? -birth control pills -cholestyramine -colestipol -vancomycin injection This list may not describe all possible interactions. Give your health care provider a list of all the medicines,  herbs, non-prescription drugs, or dietary supplements you use. Also tell them if you smoke, drink alcohol, or use illegal drugs. Some items may interact with your medicine. What should I watch for while using this medicine? Tell your doctor or health care professional if your symptoms do not improve or if you get new symptoms. Avoid taking this medicine within 3 or 4 hours of taking cholestyramine or colestipol. What side effects may I notice from receiving this medicine? Side effects that you should report to your doctor or health care professional as soon as possible: -allergic reactions like skin rash, itching or hives, swelling of the face, lips, or tongue -breathing difficulty -change in amount, color of urine -change in hearing -dizziness -fever, infection -redness, blistering, peeling or loosening of the skin, including inside the mouth -unusual bleeding or bruising -unusually weak or tired Side effects that usually do not require medical attention (report to your doctor or health care professional if they continue or are bothersome): -nausea, vomiting -stomach cramps This list may not describe all possible side effects. Call your doctor for medical advice about side effects. You may report side effects to FDA at 1-800-FDA-1088. Where should I keep my medicine? Keep out of the reach of children. After this medicine is mixed by your pharmacist, store it in a refrigerator between 2 and 8 degrees C (36 and 46 degrees F). Do not freeze. Throw away any unused medicine after 14 days. NOTE: This sheet is a summary. It may not cover all possible information. If you have questions about this medicine, talk to your doctor, pharmacist, or health care  provider.  2018 Elsevier/Gold Standard (2013-04-17 14:46:53) Clostridium Difficile Infection Clostridium difficile (C. difficile or C. diff) infection causes inflammation of the large intestine (colon). This condition can result in damage to the  lining of your colon and may lead to another condition called colitis. This infection can be passed from person to person (is contagious). Follow these instructions at home: Eating and drinking  Drink enough fluid to keep your pee (urine) clear or pale yellow.  Avoid drinking: ? Milk. ? Caffeine. ? Alcohol.  Follow exact instructions from your doctor about how to get enough fluid in your body (rehydrate).  Eat small meals often instead of large meals. Medicines  Take your antibiotic medicine as told by your doctor. Do not stop taking the antibiotic even if you start to feel better unless your doctor told you to do that.  Take over-the-counter and prescription medicines only as told by your doctor.  Do not use medicines to help with watery poop (diarrhea). General instructions  Wash your hands fully before you prepare food and after you use the bathroom. Make sure people who live with you also wash their  hands often.  Clean the surfaces that you touch. Use a product that contains chlorine bleach.  Keep all follow-up visits as told by your doctor. This is important. Contact a doctor if:  Your symptoms do not get better with treatment.  Your symptoms get worse with treatment.  Your symptoms go away and then come back.  You have a fever.  You have new symptoms. Get help right away if:  You have more pain or tenderness in your belly (abdomen).  Your poop (stool) is mostly bloody.  Your poop looks dark black and tarry.  You cannot eat or drink without throwing up (vomiting).  You have signs of dehydration, such as: ? Dark pee, very little pee, or no pee. ? Cracked lips. ? Not making tears when you cry. ? Dry mouth. ? Sunken eyes. ? Feeling sleepy. ? Feeling weak. ? Feeling dizzy. This information is not intended to replace advice given to you by your health care provider. Make sure you discuss any questions you have with your health care provider. Document  Released: 07/08/2009 Document Revised: 02/16/2016 Document Reviewed: 03/14/2015 Elsevier Interactive Patient Education  2017 [employer]

## 2017-04-12 NOTE — Progress Notes (Signed)
[employer] will do peer to peer with MD today. [facility] PT/OT is also pending to ambulate patient. Patient ambulation, SNF choice, and peer to peer needed for insurance authorization. Patient's sister Sallye OberLouise is HCPOA and main contact. CSW to continue to follow.  Abigail ButtsSusan Baila Rouse, LCSWA [phone]

## 2017-04-12 NOTE — Progress Notes (Signed)
Initial Nutrition Assessment  DOCUMENTATION CODES:   Obesity unspecified  INTERVENTION:   -Continue Pro-stat BID  -Add snacks BID; smaller, more frequent meal pattern  NUTRITION DIAGNOSIS:   Inadequate oral intake related to lethargy/confusion, acute illness, poor appetite as evidenced by meal completion < 50%.  Continues but being addressed via supplement, adding snack  GOAL:   Patient will meet greater than or equal to 90% of their needs  Progressing  MONITOR:   PO intake, Supplement acceptance, Labs, Weight trends, I & O's  REASON FOR ASSESSMENT:   Malnutrition Screening Tool    ASSESSMENT:   76 y/o female PMHx Barretts esophagus, CHF, COPD, DM, HF, HTN, GERD, A fib, persistent Cdiff. Presented to ED due to confusion. Work up showed UTI, dehydration, AKI. Admitted for evaluation/management.  Recorded po intake 48% of meals on average. Pt taking Pro-stat well, she does not like the taste but does not mind taking. Pt does not like Glucerna shakes, currently not ordered  Labs: reviewed Meds: reviewed  Diet Order:  Diet Carb Modified Fluid consistency: Thin; Room service appropriate? Yes  Skin:  Wound (see comment)  Last BM:  7/14- diarrhea  Height:   Ht Readings from Last 1 Encounters:  04/09/17 5' 1.5" (1.562 m)    Weight:   Wt Readings from Last 1 Encounters:  04/11/17 210 lb (95.3 kg)    Ideal Body Weight:  48.87 kg  BMI:  Body mass index is 39.04 kg/m.  Estimated Nutritional Needs:   Kcal:  1500-1700 kcals (MSJx 1.2 +/- 100)  Protein:  64-78 (1.3-1.6 g/kg ibw)  Fluid:  Per MD  EDUCATION NEEDS:   No education needs identified at this time  Romelle StarcherCate Jeyden Coffelt MS, RD, LDN [phone] Pager  [phone] Weekend/On-Call Pager

## 2017-04-13 LAB — CBC
HEMATOCRIT: 26.1 % — AB (ref 36.0–46.0)
HEMOGLOBIN: 8.5 g/dL — AB (ref 12.0–15.0)
MCH: 31 pg (ref 26.0–34.0)
MCHC: 32.6 g/dL (ref 30.0–36.0)
MCV: 95.3 fL (ref 78.0–100.0)
Platelets: 237 10*3/uL (ref 150–400)
RBC: 2.74 MIL/uL — ABNORMAL LOW (ref 3.87–5.11)
RDW: 17.5 % — ABNORMAL HIGH (ref 11.5–15.5)
WBC: 7.2 10*3/uL (ref 4.0–10.5)

## 2017-04-13 LAB — BASIC METABOLIC PANEL
Anion gap: 7 (ref 5–15)
BUN: 33 mg/dL — AB (ref 6–20)
CHLORIDE: 111 mmol/L (ref 101–111)
CO2: 21 mmol/L — AB (ref 22–32)
CREATININE: 1.91 mg/dL — AB (ref 0.44–1.00)
Calcium: 8.6 mg/dL — ABNORMAL LOW (ref 8.9–10.3)
GFR calc Af Amer: 29 mL/min — ABNORMAL LOW (ref >60)
GFR calc non Af Amer: 25 mL/min — ABNORMAL LOW (ref >60)
GLUCOSE: 89 mg/dL (ref 65–99)
Potassium: 3.8 mmol/L (ref 3.5–5.1)
Sodium: 139 mmol/L (ref 135–145)

## 2017-04-13 LAB — GLUCOSE, CAPILLARY
GLUCOSE-CAPILLARY: 87 mg/dL (ref 65–99)
Glucose-Capillary: 81 mg/dL (ref 65–99)
Glucose-Capillary: 86 mg/dL (ref 65–99)
Glucose-Capillary: 78 mg/dL (ref 65–99)

## 2017-04-13 MED ORDER — HYDROCODONE-ACETAMINOPHEN 5-325 MG PO TABS
1.0000 | ORAL_TABLET | ORAL | Status: DC | PRN
  Administered 2017-04-13 – 2017-04-16 (×6): 1 via ORAL
  Filled 2017-04-13 (×6): qty 1

## 2017-04-13 MED ORDER — VANCOMYCIN 50 MG/ML ORAL SOLUTION
125.0000 mg | Freq: Two times a day (BID) | ORAL | Status: DC
  Administered 2017-04-13 – 2017-04-15 (×5): 125 mg via ORAL
  Filled 2017-04-13 (×5): qty 2.5

## 2017-04-13 NOTE — Progress Notes (Signed)
Patient ID: Yolanda MurrainBrenda W Lebleu, female   DOB: 21-Aug-1941, 76 y.o.   MRN: [id number]  PROGRESS NOTE    Yolanda MurrainBrenda W Mohammad  NWG:[id number] DOB: 21-Aug-1941 DOA: 04/05/2017  PCP: Kari BaarsHawkins, Edward, MD   Brief Narrative:  76 year old female with multiple UTI's over the past few weeks PTA, hospitalized at [facility] but [facility] per family request for further care. Pt also had C.diff colitis and is being treated with vanco PO. Pt has had hyponatremia, renal failure and UTI. IN addition, she had worsening mental status changes and EEG done 7/17 which showed diffuse slowing of waking cycle.   Assessment & Plan:    Principal Problem: Acute delirium / Acute metabolic encephalopathy - Secondary to UTI - EEG done 7/17 showed diffuse slowing of waking cycle - CT head showed no acute intracranial abnormalities - Skilled nursing facility recommended per physical therapy, appreciate social work assistance with placement   Active Problems: Enterobacter aerogenes UTI - Stopped meropenem 7/18  C.diff colitis - Vanco taper - 125 mg BID for 7 days, 125 mg daily for next 7 days and then 125 mg every 48 hours for following 7 days and 125 mg every 3 days fir 14 days.  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) / Hypomagnesemia - Cr 4.28 in 02/2017 - Cr within baseline range  - Supplemented magnesium   Hypernatremia - Due to CKD - Sodium normalized   Paroxysmal A fib - CHADS vasc score 5 - Continue apixaban for AC - Continue Cardizem and metoprolol for rate control   Chronic diastolic CHF - CXR with vascular congestion  - Gotten lasix 40 mg IV on 7/17 - ECHO in 12/2015 showed preserved EF, grade 2 DD - Stable resp status   Diabetes mellitus with diabetic nephropathy - Continue SSI  Anemia of chronic kidney disease - Transfused 1 U PRBC 7/19 - Hgb stable     DVT prophylaxis: Continue apixaban Code Status: full code  Family Communication: spoke with pt sister 7/20 Disposition Plan: anticipate d/c to SNF on Monday    Consultants:   PT  Procedures:   EEG 7/17 - diffuse slowing of the waking background, could indicated diffuse cerebral dysfunction  Antimicrobials:   Meropenem 7/14 --> 7/18  Vanco taper  7/14 -->   Subjective: No overnight events.  Objective: Vitals:   04/12/17 2109 04/12/17 2111 04/13/17 0538 04/13/17 0823  BP: (!) 118/49  (!) 123/44 (!) 120/48  Pulse: 88  84 89  Resp: 18  18 16   Temp: (!) 97.4 F (36.3 C)  98.3 F (36.8 C) (!) 97 F (36.1 C)  TempSrc: Oral  Oral Oral  SpO2: 97% 97% 94% 93%  Weight:      Height:        Intake/Output Summary (Last 24 hours) at 04/13/17 0847 Last data filed at 04/13/17 0554  Gross per 24 hour  Intake              700 ml  Output             1130 ml  Net             -430 ml   Filed Weights   04/09/17 0100 04/10/17 2234 04/11/17 2105  Weight: 93 kg (205 lb 0.4 oz) 98.9 kg (218 lb) 95.3 kg (210 lb)    Examination:  General exam: No distress  Respiratory  system: diminished breath sounds, no wheezing  Cardiovascular system: (+) S1, S2, rate controlled  Gastrointestinal system: (+) BS, non tender abdomen  Central nervous system: Mo focal deficits . Extremities: +1 LE edema, palpable pulses  Skin: warm, dry  Psychiatry: Normal mood and behavior   Data Reviewed: I have personally reviewed following labs and imaging studies  CBC:  Recent Labs Lab 04/09/17 0828 04/10/17 0242 04/11/17 0528 04/12/17 0427 04/13/17 0754  WBC 9.6 7.9 8.0 8.4 7.2  HGB 7.8* 7.4* 7.6* 8.7* 8.5*  HCT 24.2* 22.3* 23.3* 26.2* 26.1*  MCV 96.8 95.3 95.9 94.6 95.3  PLT 245 217 246 249 237   Basic Metabolic Panel:  Recent Labs Lab 04/08/17 0713 04/09/17 0828 04/10/17 0242 04/11/17 0528 04/12/17 0427  NA 150* 144 139 140 140  K 4.3 4.3 3.8 4.1 3.9  CL 122* 117* 111 112*  112*  CO2 21* 21* 20* 22 21*  GLUCOSE 87 85 95 94 88  BUN 14 14 17  22* 27*  CREATININE 2.33* 2.03* 1.84* 2.06* 1.96*  CALCIUM 7.4* 7.4* 7.4* 7.9* 8.4*  MG  --  1.0*  --  1.4* 1.9   GFR: Estimated Creatinine Clearance: 26.8 mL/min (A) (by C-G formula based on SCr of 1.96 mg/dL (H)). Liver Function Tests:  Recent Labs Lab 04/09/17 0828  AST 46*  ALT 24  ALKPHOS 159*  BILITOT 1.1  PROT 4.6*  ALBUMIN 2.1*   No results for input(s): LIPASE, AMYLASE in the last 168 hours.  Recent Labs Lab 04/09/17 0828  AMMONIA 78*   Coagulation Profile: No results for input(s): INR, PROTIME in the last 168 hours. Cardiac Enzymes: No results for input(s): CKTOTAL, CKMB, CKMBINDEX, TROPONINI in the last 168 hours. BNP (last 3 results) No results for input(s): PROBNP in the last 8760 hours. HbA1C: No results for input(s): HGBA1C in the last 72 hours. CBG:  Recent Labs Lab 04/11/17 2101 04/12/17 0732 04/12/17 1605 04/12/17 2114 04/13/17 0729  GLUCAP 108* 88 105* 114* 81   Lipid Profile: No results for input(s): CHOL, HDL, LDLCALC, TRIG, CHOLHDL, LDLDIRECT in the last 72 hours. Thyroid Function Tests: No results for input(s): TSH, T4TOTAL, FREET4, T3FREE, THYROIDAB in the last 72 hours. Anemia Panel: No results for input(s): VITAMINB12, FOLATE, FERRITIN, TIBC, IRON, RETICCTPCT in the last 72 hours. Urine analysis:    Component Value Date/Time   COLORURINE AMBER (A) 04/05/2017 1950   APPEARANCEUR TURBID (A) 04/05/2017 1950   LABSPEC 1.012 04/05/2017 1950   PHURINE 5.0 04/05/2017 1950   GLUCOSEU NEGATIVE 04/05/2017 1950   HGBUR SMALL (A) 04/05/2017 1950   BILIRUBINUR NEGATIVE 04/05/2017 1950   KETONESUR NEGATIVE 04/05/2017 1950   PROTEINUR 30 (A) 04/05/2017 1950   NITRITE NEGATIVE 04/05/2017 1950   LEUKOCYTESUR LARGE (A) 04/05/2017 1950   Sepsis Labs: @LABRCNTIP (procalcitonin:4,lacticidven:4)   Recent Results (from the past 240 hour(s))  Culture, Urine     Status:  Abnormal   Collection Time: 04/04/17  7:40 AM  Result Value Ref Range Status   Specimen Description URINE, CATHETERIZED  Final   Special Requests NONE  Final   Culture >=100,000 COLONIES/mL ENTEROBACTER AEROGENES (A)  Final   Report Status 04/07/2017 FINAL  Final   Organism ID, Bacteria ENTEROBACTER AEROGENES (A)  Final      Susceptibility   Enterobacter aerogenes - MIC*    CEFAZOLIN >=64 RESISTANT Resistant     CEFTRIAXONE <=1 SENSITIVE Sensitive     CIPROFLOXACIN <=0.25 SENSITIVE Sensitive     GENTAMICIN <=1 SENSITIVE Sensitive  IMIPENEM 1 SENSITIVE Sensitive     NITROFURANTOIN <=16 SENSITIVE Sensitive     TRIMETH/SULFA <=20 SENSITIVE Sensitive     PIP/TAZO <=4 SENSITIVE Sensitive     * >=100,000 COLONIES/mL ENTEROBACTER AEROGENES  Urine culture     Status: Abnormal   Collection Time: 04/05/17  7:50 PM  Result Value Ref Range Status   Specimen Description URINE, CATHETERIZED  Final   Special Requests NONE  Final   Culture >=100,000 COLONIES/mL ENTEROBACTER AEROGENES (A)  Final   Report Status 04/08/2017 FINAL  Final   Organism ID, Bacteria ENTEROBACTER AEROGENES (A)  Final      Susceptibility   Enterobacter aerogenes - MIC*    CEFAZOLIN >=64 RESISTANT Resistant     CEFTRIAXONE <=1 SENSITIVE Sensitive     CIPROFLOXACIN <=0.25 SENSITIVE Sensitive     GENTAMICIN <=1 SENSITIVE Sensitive     IMIPENEM 2 SENSITIVE Sensitive     NITROFURANTOIN <=16 SENSITIVE Sensitive     TRIMETH/SULFA <=20 SENSITIVE Sensitive     PIP/TAZO <=4 SENSITIVE Sensitive     * >=100,000 COLONIES/mL ENTEROBACTER AEROGENES      Radiology Studies: No results found.      Scheduled Meds: . albuterol  3 mL Inhalation BID  . apixaban  5 mg Oral BID  . calcium carbonate  1 tablet Oral BID  . diltiazem  120 mg Oral Daily  . feeding supplement (PRO-STAT SUGAR FREE 64)  30 mL Oral BID  . fluticasone furoate-vilanterol  1 puff Inhalation Daily  . gabapentin  200 mg Oral QHS  . insulin aspart  0-15  Units Subcutaneous TID WC  . insulin aspart  0-5 Units Subcutaneous QHS  . levothyroxine  137 mcg Oral QAC breakfast  . metoprolol tartrate  12.5 mg Oral BID  . sodium bicarbonate  650 mg Oral BID  . vancomycin  250 mg Oral BID   Continuous Infusions:   LOS: 7 days    Time spent: 15 minutes Greater than 50% of the time spent on counseling and coordinating the care.   Manson Passey, MD [facility] Pager [phone]  If 7PM-7AM, please contact night-coverage [email or web address] Password [facility] 04/13/2017, 8:47 AM

## 2017-04-14 LAB — BASIC METABOLIC PANEL
ANION GAP: 7 (ref 5–15)
BUN: 38 mg/dL — ABNORMAL HIGH (ref 6–20)
CALCIUM: 8.5 mg/dL — AB (ref 8.9–10.3)
CHLORIDE: 111 mmol/L (ref 101–111)
CO2: 21 mmol/L — AB (ref 22–32)
CREATININE: 1.72 mg/dL — AB (ref 0.44–1.00)
GFR calc non Af Amer: 28 mL/min — ABNORMAL LOW (ref >60)
GFR, EST AFRICAN AMERICAN: 33 mL/min — AB (ref >60)
Glucose, Bld: 107 mg/dL — ABNORMAL HIGH (ref 65–99)
Potassium: 4.3 mmol/L (ref 3.5–5.1)
SODIUM: 139 mmol/L (ref 135–145)

## 2017-04-14 LAB — GLUCOSE, CAPILLARY
GLUCOSE-CAPILLARY: 88 mg/dL (ref 65–99)
GLUCOSE-CAPILLARY: 102 mg/dL — AB (ref 65–99)
GLUCOSE-CAPILLARY: 85 mg/dL (ref 65–99)
Glucose-Capillary: 89 mg/dL (ref 65–99)

## 2017-04-14 LAB — CBC
HEMATOCRIT: 26.3 % — AB (ref 36.0–46.0)
HEMOGLOBIN: 8.7 g/dL — AB (ref 12.0–15.0)
MCH: 31 pg (ref 26.0–34.0)
MCHC: 33.1 g/dL (ref 30.0–36.0)
MCV: 93.6 fL (ref 78.0–100.0)
Platelets: 273 10*3/uL (ref 150–400)
RBC: 2.81 MIL/uL — ABNORMAL LOW (ref 3.87–5.11)
RDW: 17.5 % — ABNORMAL HIGH (ref 11.5–15.5)
WBC: 8.2 10*3/uL (ref 4.0–10.5)

## 2017-04-14 NOTE — Progress Notes (Signed)
Patient ID: Yolanda [name], female   DOB: [birth year], 76 y.o.   MRN: [id number]  PROGRESS NOTE    Yolanda [name]  WUJ:[id number] DOB: 17-Jan-1941 DOA: 04/05/2017  PCP: Kari Baars, MD   Brief Narrative:  76 year old female with multiple UTI's over the past few weeks PTA, hospitalized at [facility] but subsequently transferred to [facility] per family request for further care. Pt also had C.diff colitis and is being treated with vanco PO. Pt has had hyponatremia, renal failure and UTI. IN addition, she had worsening mental status changes and EEG done 7/17 which showed diffuse slowing of waking cycle.   Assessment & Plan:    Principal Problem: Acute delirium / Acute metabolic encephalopathy - Secondary to UTI - EEG done 7/17 showed diffuse slowing of waking cycle - CT head showed no acute intracranial abnormalities - Awaiting placement to SNF   Active Problems: Enterobacter aerogenes UTI - Stopped meropenem 7/18  C.diff colitis - Vanco taper - 125 mg BID for 7 days (started 7/21), 125 mg daily for next 7 days and then 125 mg every 48 hours for following 7 days and 125 mg every 3 days fir 14 days.  Essential hypertension - Continue metoprolol and cardizem  Hypothyroidism - Continue synthroid   Acute renal failure superimposed on CKD stage 4 (HCC) / Hypomagnesemia - Cr 4.28 in 02/2017 - Cr within baseline range   Hypernatremia - Due to CKD - Sodium WNL  Paroxysmal A fib - CHADS vasc score 5 - Continue apixaban for AC - Cardizem and metoprolol for rate control   Chronic diastolic CHF - CXR with vascular congestion  - Gotten lasix 40 mg IV on 7/17 - ECHO in 12/2015 showed preserved EF, grade 2 DD  Diabetes mellitus with diabetic nephropathy - Continue SSI  Anemia of chronic kidney disease - Transfused 1 U PRBC 7/19 - Hgb stable    DVT prophylaxis: Apixaban Code Status: full code  Family Communication: family not at the bedside this am; spoke with pt sister  7/20 Disposition Plan: d/c to snf Monday if bed available    Consultants:   PT  Procedures:   EEG 7/17 - diffuse slowing of the waking background, could indicated diffuse cerebral dysfunction  Antimicrobials:   Meropenem 7/14 --> 7/18  Vanco taper  7/14 -->   Subjective: No overnight events.   Objective: Vitals:   04/13/17 2100 04/14/17 0440 04/14/17 0845 04/14/17 0858  BP: 113/60 (!) 111/53 (!) 116/59   Pulse: 78 73 69   Resp: 20 16 20    Temp: 98.2 F (36.8 C) 97.6 F (36.4 C) (!) 97.1 F (36.2 C)   TempSrc: Oral Oral Oral   SpO2: 94% 95% 97% 95%  Weight:      Height:        Intake/Output Summary (Last 24 hours) at 04/14/17 1136 Last data filed at 04/14/17 0600  Gross per 24 hour  Intake              240 ml  Output              650 ml  Net             -410 ml   Filed Weights   04/10/17 2234 04/11/17 2105 04/13/17 2051  Weight: 98.9 kg (218 lb) 95.3 kg (210 lb) 99.8 kg (220 lb)    Physical Exam  Constitutional: Appears well-developed and well-nourished. No distress.  CVS: RRR, S1/S2 + Pulmonary: diminished breath sounds, no wheezing  Abdominal: Soft. BS +,  no distension, tenderness, rebound or guarding.  Musculoskeletal: Normal range of motion. + 1 LE edmea Lymphadenopathy: No lymphadenopathy noted, cervical, inguinal. Neuro: Alert. Normal reflexes, muscle tone coordination. No cranial nerve deficit. Skin: Skin is warm and dry.  Psychiatric: Normal mood and affect.     Data Reviewed: I have personally reviewed following labs and imaging studies  CBC:  Recent Labs Lab 04/10/17 0242 04/11/17 0528 04/12/17 0427 04/13/17 0754 04/14/17 0304  WBC 7.9 8.0 8.4 7.2 8.2  HGB 7.4* 7.6* 8.7* 8.5* 8.7*  HCT 22.3* 23.3* 26.2* 26.1* 26.3*  MCV 95.3 95.9 94.6 95.3 93.6  PLT 217 246 249 237 273   Basic Metabolic Panel:  Recent Labs Lab 04/09/17 0828 04/10/17 0242 04/11/17 0528 04/12/17 0427 04/13/17 0754 04/14/17 0304  NA 144 139 140 140 139  139  K 4.3 3.8 4.1 3.9 3.8 4.3  CL 117* 111 112* 112* 111 111  CO2 21* 20* 22 21* 21* 21*  GLUCOSE 85 95 94 88 89 107*  BUN 14 17 22* 27* 33* 38*  CREATININE 2.03* 1.84* 2.06* 1.96* 1.91* 1.72*  CALCIUM 7.4* 7.4* 7.9* 8.4* 8.6* 8.5*  MG 1.0*  --  1.4* 1.9  --   --    GFR: Estimated Creatinine Clearance: 31.4 mL/min (A) (by C-G formula based on SCr of 1.72 mg/dL (H)). Liver Function Tests:  Recent Labs Lab 04/09/17 0828  AST 46*  ALT 24  ALKPHOS 159*  BILITOT 1.1  PROT 4.6*  ALBUMIN 2.1*   No results for input(s): LIPASE, AMYLASE in the last 168 hours.  Recent Labs Lab 04/09/17 0828  AMMONIA 78*   Coagulation Profile: No results for input(s): INR, PROTIME in the last 168 hours. Cardiac Enzymes: No results for input(s): CKTOTAL, CKMB, CKMBINDEX, TROPONINI in the last 168 hours. BNP (last 3 results) No results for input(s): PROBNP in the last 8760 hours. HbA1C: No results for input(s): HGBA1C in the last 72 hours. CBG:  Recent Labs Lab 04/13/17 0729 04/13/17 1159 04/13/17 1727 04/13/17 2050 04/14/17 0721  GLUCAP 81 86 87 78 88   Lipid Profile: No results for input(s): CHOL, HDL, LDLCALC, TRIG, CHOLHDL, LDLDIRECT in the last 72 hours. Thyroid Function Tests: No results for input(s): TSH, T4TOTAL, FREET4, T3FREE, THYROIDAB in the last 72 hours. Anemia Panel: No results for input(s): VITAMINB12, FOLATE, FERRITIN, TIBC, IRON, RETICCTPCT in the last 72 hours. Urine analysis:    Component Value Date/Time   COLORURINE AMBER (A) 04/05/2017 1950   APPEARANCEUR TURBID (A) 04/05/2017 1950   LABSPEC 1.012 04/05/2017 1950   PHURINE 5.0 04/05/2017 1950   GLUCOSEU NEGATIVE 04/05/2017 1950   HGBUR SMALL (A) 04/05/2017 1950   BILIRUBINUR NEGATIVE 04/05/2017 1950   KETONESUR NEGATIVE 04/05/2017 1950   PROTEINUR 30 (A) 04/05/2017 1950   NITRITE NEGATIVE 04/05/2017 1950   LEUKOCYTESUR LARGE (A) 04/05/2017 1950   Sepsis  Labs: @LABRCNTIP (procalcitonin:4,lacticidven:4)   Recent Results (from the past 240 hour(s))  Urine culture     Status: Abnormal   Collection Time: 04/05/17  7:50 PM  Result Value Ref Range Status   Specimen Description URINE, CATHETERIZED  Final   Special Requests NONE  Final   Culture >=100,000 COLONIES/mL ENTEROBACTER AEROGENES (A)  Final   Report Status 04/08/2017 FINAL  Final   Organism ID, Bacteria ENTEROBACTER AEROGENES (A)  Final      Susceptibility   Enterobacter aerogenes - MIC*    CEFAZOLIN >=64 RESISTANT Resistant     CEFTRIAXONE <=1 SENSITIVE  Sensitive     CIPROFLOXACIN <=0.25 SENSITIVE Sensitive     GENTAMICIN <=1 SENSITIVE Sensitive     IMIPENEM 2 SENSITIVE Sensitive     NITROFURANTOIN <=16 SENSITIVE Sensitive     TRIMETH/SULFA <=20 SENSITIVE Sensitive     PIP/TAZO <=4 SENSITIVE Sensitive     * >=100,000 COLONIES/mL ENTEROBACTER AEROGENES      Radiology Studies: No results found.      Scheduled Meds: . albuterol  3 mL Inhalation BID  . apixaban  5 mg Oral BID  . calcium carbonate  1 tablet Oral BID  . diltiazem  120 mg Oral Daily  . feeding supplement (PRO-STAT SUGAR FREE 64)  30 mL Oral BID  . fluticasone furoate-vilanterol  1 puff Inhalation Daily  . gabapentin  200 mg Oral QHS  . insulin aspart  0-15 Units Subcutaneous TID WC  . insulin aspart  0-5 Units Subcutaneous QHS  . levothyroxine  137 mcg Oral QAC breakfast  . metoprolol tartrate  12.5 mg Oral BID  . sodium bicarbonate  650 mg Oral BID  . vancomycin  125 mg Oral BID   Continuous Infusions:   LOS: 8 days    Time spent: 15 minutes Greater than 50% of the time spent on counseling and coordinating the care.   Manson Passey, MD [facility] Pager [phone]  If 7PM-7AM, please contact night-coverage [email or web address] Password [facility] 04/14/2017, 11:36 AM

## 2017-04-15 LAB — GLUCOSE, CAPILLARY
Glucose-Capillary: 107 mg/dL — ABNORMAL HIGH (ref 65–99)
Glucose-Capillary: 116 mg/dL — ABNORMAL HIGH (ref 65–99)
Glucose-Capillary: 98 mg/dL (ref 65–99)
Glucose-Capillary: 95 mg/dL (ref 65–99)
Glucose-Capillary: 90 mg/dL (ref 65–99)

## 2017-04-15 MED ORDER — VANCOMYCIN 50 MG/ML ORAL SOLUTION
125.0000 mg | Freq: Two times a day (BID) | ORAL | Status: DC
  Administered 2017-04-15 – 2017-04-19 (×8): 125 mg via ORAL
  Filled 2017-04-15 (×8): qty 2.5

## 2017-04-15 MED ORDER — VANCOMYCIN 50 MG/ML ORAL SOLUTION: 125.0000 mg | Freq: Every day | ORAL | Status: DC

## 2017-04-15 MED ORDER — VANCOMYCIN 50 MG/ML ORAL SOLUTION: 125.0000 mg | ORAL | Status: DC

## 2017-04-15 NOTE — Clinical Social Work Note (Addendum)
CSW provided with name and phone number of [employer] representative, [name] [phone], ext. [phone]. [facility] messages left (11:07 am & 1 pm). Call made again to [employer] (2:16 pm) and spoke with [name] regarding patient. Was routed to [place] (ext. [phone]) who [facility]. CSW advised that their medical director contacted MD (Dr. [name]) and left a message on past Friday. CSW provided [name] with mobile number for Dr. [name] and faxed most recent PT treatment note (fax #[phone]) [facility]. CSW will continue to follow and assist with discharge to SNF once authorization received.  Call made (3:36 pm) to admissions director [name] [phone], ext. [phone]) to provided update on insurance auth. [name] reported that she filled her last female bed on today and does not anticipate an available female bed until Wednesday. CSW will f/u with [name] if peer-to-peer decision favorable for patient regarding bed availability.   CSW received call from Dr. [name] (4:54 pm) to report that she talked with [employer] MD and she is denying authorization request. CSW also received call from Fleet Contrasachel with [employer] (5:05 pm) to advise of denial. [name] reported that per their medical director, SNF for rehab denied as patient not able to participate in therapy and care can be provided at a lower level of care, such as LTC in SNF. [name] will be sending denial information to CSW, probably on Tuesday, 7/24.  CSW talked with sisters Yolanda [name] and Yolanda [name] at the bedside regarding insurance denial. Also talked with sister Yolanda [name] by phone (sister called her) and advised her that she will be contacted once I receive denial paperwork from [employer] as sisters as interested in appealing denial.     Yolanda [name], Yolanda [name], Yolanda [name] Licensed Clinical Social Worker Clinical Social Work Department [facility] [phone]

## 2017-04-15 NOTE — Progress Notes (Signed)
Patient ID: Yolanda [name], female   DOB: 07-31-1941, 76 y.o.   MRN: [id number]  PROGRESS NOTE    Yolanda [name]  WUJ:[id number] DOB: Jan 19, 1941 DOA: 04/05/2017  PCP: Kari Baars, MD   Brief Narrative:  76 year old female with multiple UTI's over the past few weeks PTA, hospitalized at [facility] but subsequently transferred to [facility] per family request for further care. Pt also had C.diff colitis and is being treated with vanco PO. Pt has had hyponatremia, renal failure and UTI. IN addition, she had worsening mental status changes and EEG done 7/17 which showed diffuse slowing of waking cycle.   Assessment & Plan:    Principal Problem: Acute delirium / Acute metabolic encephalopathy - Secondary to UTI - EEG done 7/17 showed diffuse slowing of waking cycle - No acute findings on CT head - Awaiting SNF placement    Active Problems: Enterobacter aerogenes UTI - Stopped meropenem 7/18  C.diff colitis - Vanco taper - 125 mg BID for 7 days (started 7/21), 125 mg daily for next 7 days and then 125 mg every 48 hours for following 7 days and 125 mg every 3 days fir 14 days.  Essential hypertension - Continue Cardizem and metoprolol  Hypothyroidism - Continue synthroid   Acute renal failure superimposed on CKD stage 4 (HCC) / Hypomagnesemia - Cr 4.28 in 02/2017 - Cr within baseline range  Hypernatremia - Due to CKD - Sodium WNL  Paroxysmal A fib - CHADS vasc score 5 - Continue apixaban for AC - Continue Cardizem and metoprolol for rate control  Chronic diastolic CHF - CXR with vascular congestion  - Gotten lasix 40 mg IV on 7/17 - ECHO in 12/2015 showed preserved EF, grade 2 DD  Diabetes mellitus with diabetic nephropathy - CBG's in past 24 hours: 89,107, 116  Anemia of chronic kidney disease - Transfused 1 U PRBC 7/19 - Hgb 8.7   DVT prophylaxis: Apixaban Code Status: full code  Family Communication: family not at bedside this am Disposition Plan: to SNF once  bed available   Consultants:   PT  Procedures:   EEG 7/17 - diffuse slowing of the waking background, could indicated diffuse cerebral dysfunction  Antimicrobials:   Meropenem 7/14 --> 7/18  Vanco taper  7/14 -->   Subjective: No overnight events.  Objective: Vitals:   04/15/17 0617 04/15/17 0754 04/15/17 0800 04/15/17 1000  BP: 125/65   (!) 112/50  Pulse: 88   85  Resp: 20   20  Temp: 98.4 F (36.9 C)   98.2 F (36.8 C)  TempSrc: Oral   Oral  SpO2: 95% 93% 93% 94%  Weight:      Height:        Intake/Output Summary (Last 24 hours) at 04/15/17 1207 Last data filed at 04/15/17 0900  Gross per 24 hour  Intake               60 ml  Output             1075 ml  Net            -1015 ml   Filed Weights   04/11/17 2105 04/13/17 2051 04/14/17 2100  Weight: 95.3 kg (210 lb) 99.8 kg (220 lb) 99.5 kg (219 lb 5.7 oz)    Physical Exam  Constitutional: Appears well-developed and well-nourished. No distress.  CVS: RRR, S1/S2 (+) Pulmonary: Effort and breath sounds normal, no stridor, rhonchi, wheezes, rales.  Abdominal: Soft. BS +,  no distension,  tenderness, rebound or guarding.  Musculoskeletal: Normal range of motion. +1 LE edema Lymphadenopathy: No lymphadenopathy noted, cervical, inguinal. Neuro: Alert. No focal deficits  Skin: Skin is warm and dry.  Psychiatric: Normal mood and affect.   Data Reviewed: I have personally reviewed following labs and imaging studies  CBC:  Recent Labs Lab 04/10/17 0242 04/11/17 0528 04/12/17 0427 04/13/17 0754 04/14/17 0304  WBC 7.9 8.0 8.4 7.2 8.2  HGB 7.4* 7.6* 8.7* 8.5* 8.7*  HCT 22.3* 23.3* 26.2* 26.1* 26.3*  MCV 95.3 95.9 94.6 95.3 93.6  PLT 217 246 249 237 273   Basic Metabolic Panel:  Recent Labs Lab 04/09/17 0828 04/10/17 0242 04/11/17 0528 04/12/17 0427 04/13/17 0754 04/14/17 0304  NA 144 139 140 140 139 139  K 4.3 3.8 4.1 3.9 3.8 4.3  CL 117* 111 112* 112* 111 111  CO2 21* 20* 22 21* 21* 21*    GLUCOSE 85 95 94 88 89 107*  BUN 14 17 22* 27* 33* 38*  CREATININE 2.03* 1.84* 2.06* 1.96* 1.91* 1.72*  CALCIUM 7.4* 7.4* 7.9* 8.4* 8.6* 8.5*  MG 1.0*  --  1.4* 1.9  --   --    GFR: Estimated Creatinine Clearance: 31.3 mL/min (A) (by C-G formula based on SCr of 1.72 mg/dL (H)). Liver Function Tests:  Recent Labs Lab 04/09/17 0828  AST 46*  ALT 24  ALKPHOS 159*  BILITOT 1.1  PROT 4.6*  ALBUMIN 2.1*   No results for input(s): LIPASE, AMYLASE in the last 168 hours.  Recent Labs Lab 04/09/17 0828  AMMONIA 78*   Coagulation Profile: No results for input(s): INR, PROTIME in the last 168 hours. Cardiac Enzymes: No results for input(s): CKTOTAL, CKMB, CKMBINDEX, TROPONINI in the last 168 hours. BNP (last 3 results) No results for input(s): PROBNP in the last 8760 hours. HbA1C: No results for input(s): HGBA1C in the last 72 hours. CBG:  Recent Labs Lab 04/14/17 1205 04/14/17 1654 04/14/17 2103 04/15/17 0749 04/15/17 1141  GLUCAP 102* 85 89 107* 116*   Lipid Profile: No results for input(s): CHOL, HDL, LDLCALC, TRIG, CHOLHDL, LDLDIRECT in the last 72 hours. Thyroid Function Tests: No results for input(s): TSH, T4TOTAL, FREET4, T3FREE, THYROIDAB in the last 72 hours. Anemia Panel: No results for input(s): VITAMINB12, FOLATE, FERRITIN, TIBC, IRON, RETICCTPCT in the last 72 hours. Urine analysis:    Component Value Date/Time   COLORURINE AMBER (A) 04/05/2017 1950   APPEARANCEUR TURBID (A) 04/05/2017 1950   LABSPEC 1.012 04/05/2017 1950   PHURINE 5.0 04/05/2017 1950   GLUCOSEU NEGATIVE 04/05/2017 1950   HGBUR SMALL (A) 04/05/2017 1950   BILIRUBINUR NEGATIVE 04/05/2017 1950   KETONESUR NEGATIVE 04/05/2017 1950   PROTEINUR 30 (A) 04/05/2017 1950   NITRITE NEGATIVE 04/05/2017 1950   LEUKOCYTESUR LARGE (A) 04/05/2017 1950   Sepsis Labs: @LABRCNTIP (procalcitonin:4,lacticidven:4)   Recent Results (from the past 240 hour(s))  Urine culture     Status: Abnormal    Collection Time: 04/05/17  7:50 PM  Result Value Ref Range Status   Specimen Description URINE, CATHETERIZED  Final   Special Requests NONE  Final   Culture >=100,000 COLONIES/mL ENTEROBACTER AEROGENES (A)  Final   Report Status 04/08/2017 FINAL  Final   Organism ID, Bacteria ENTEROBACTER AEROGENES (A)  Final      Susceptibility   Enterobacter aerogenes - MIC*    CEFAZOLIN >=64 RESISTANT Resistant     CEFTRIAXONE <=1 SENSITIVE Sensitive     CIPROFLOXACIN <=0.25 SENSITIVE Sensitive     GENTAMICIN <=  1 SENSITIVE Sensitive     IMIPENEM 2 SENSITIVE Sensitive     NITROFURANTOIN <=16 SENSITIVE Sensitive     TRIMETH/SULFA <=20 SENSITIVE Sensitive     PIP/TAZO <=4 SENSITIVE Sensitive     * >=100,000 COLONIES/mL ENTEROBACTER AEROGENES      Radiology Studies: No results found.      Scheduled Meds: . albuterol  3 mL Inhalation BID  . apixaban  5 mg Oral BID  . calcium carbonate  1 tablet Oral BID  . diltiazem  120 mg Oral Daily  . feeding supplement (PRO-STAT SUGAR FREE 64)  30 mL Oral BID  . fluticasone furoate-vilanterol  1 puff Inhalation Daily  . gabapentin  200 mg Oral QHS  . insulin aspart  0-15 Units Subcutaneous TID WC  . insulin aspart  0-5 Units Subcutaneous QHS  . levothyroxine  137 mcg Oral QAC breakfast  . metoprolol tartrate  12.5 mg Oral BID  . sodium bicarbonate  650 mg Oral BID  . vancomycin  125 mg Oral BID   Continuous Infusions:   LOS: 9 days    Time spent: 15 minutes Greater than 50% of the time spent on counseling and coordinating the care.   Manson Passey, MD [facility] Pager [phone]  If 7PM-7AM, please contact night-coverage [email or web address] Password [facility] 04/15/2017, 12:07 PM

## 2017-04-15 NOTE — Progress Notes (Signed)
Physical Therapy Treatment Patient Details Name: Yolanda [name] MRN: [id number] DOB: 02/18/1941 Today's Date: 04/15/2017    History of Present Illness 76 yo white female admitted for recurrent UTI and fragile skin with periph edema, recent c-diff, renal failure, low Na+, CHF and anemia related to CKD.  PMHx: SNF stay, falls, CHF, PAF, DM, PN, CKD, HTN, UTI's    PT Comments    Pt is more lethargic today than last PT visit, and unable to either quantify or locate her pain in legs for PT.  Was not safe to try to get her up to the chair, pt would not open her eyes to talk with PT.  Worked at ROM and was helpful to reposition her after but not as mobile a session.  Will continue to see her acutely to progress balance and work toward gait since pt had a higher PLOF.     Follow Up Recommendations  SNF     Equipment Recommendations  None recommended by PT    Recommendations for Other Services       Precautions / Restrictions Precautions Precautions: Fall Precaution Comments: use care with moving LE's Restrictions Weight Bearing Restrictions: No Other Position/Activity Restrictions: LE pain with any movement    Mobility  Bed Mobility Overal bed mobility: Needs Assistance             General bed mobility comments: Pt is not assisting with any movement in bed and requires PT to initiate all which is a departure from last visit  Transfers                 General transfer comment: not awake to attempt  Ambulation/Gait             General Gait Details: unable   Stairs            Wheelchair Mobility    Modified Rankin (Stroke Patients Only)       Balance Overall balance assessment: History of Falls                                          Cognition Arousal/Alertness: Lethargic Behavior During Therapy: Flat affect (eyes were closed most of session) Overall Cognitive Status: No family/caregiver present to determine baseline cognitive  functioning Area of Impairment: Following commands;Awareness;Problem solving;Attention                   Current Attention Level: Selective   Following Commands: Follows one step commands inconsistently Safety/Judgement: Decreased awareness of deficits Awareness: Intellectual Problem Solving: Slow processing;Decreased initiation;Requires verbal cues;Requires tactile cues General Comments: 100% cues for all mobility      Exercises General Exercises - Lower Extremity Ankle Circles/Pumps: AROM;AAROM;Both;10 reps Quad Sets: Both;5 reps;AAROM Heel Slides: AAROM;Both;10 reps Hip ABduction/ADduction: AAROM;Both;10 reps    General Comments General comments (skin integrity, edema, etc.): Pt is too lethargic to get up today but did work on attempt to awaken her then to do bed exercises      Pertinent Vitals/Pain Pain Assessment: Faces Faces Pain Scale: Hurts whole lot Pain Location: lower legs with any movement or touch Pain Descriptors / Indicators: Sore;Sharp Pain Intervention(s): Limited activity within patient's tolerance;Monitored during session;Premedicated before session;Repositioned    Home Living                      Prior Function  PT Goals (current goals can now be found in the care plan section) Acute Rehab PT Goals Patient Stated Goal: none stated PT Goal Formulation: With patient Potential to Achieve Goals: Fair Progress towards PT goals: Not progressing toward goals - comment (very much lethargic today vs last PT visit)    Frequency    Min 2X/week      PT Plan Current plan remains appropriate    Co-evaluation              AM-PAC PT "6 Clicks" Daily Activity  Outcome Measure  Difficulty turning over in bed (including adjusting bedclothes, sheets and blankets)?: Total Difficulty moving from lying on back to sitting on the side of the bed? : Total Difficulty sitting down on and standing up from a chair with arms (e.g.,  wheelchair, bedside commode, etc,.)?: Total Help needed moving to and from a bed to chair (including a wheelchair)?: Total Help needed walking in hospital room?: Total Help needed climbing 3-5 steps with a railing? : Total 6 Click Score: 6    End of Session   Activity Tolerance: No increased pain;Patient limited by fatigue Patient left: in bed;with call bell/phone within reach;with bed alarm set Nurse Communication: Mobility status PT Visit Diagnosis: Muscle weakness (generalized) (M62.81);Difficulty in walking, not elsewhere classified (R26.2) Pain - Right/Left:  (B) Pain - part of body: Leg;Ankle and joints of foot     Time: 1610-9604 PT Time Calculation (min) (ACUTE ONLY): 14 min  Charges:                       G Codes:        Ivar Drape 22-Apr-2017, 1:30 PM   Samul Dada, PT MS Acute Rehab Dept. Number: [facility] [id number] and [facility] [phone]

## 2017-04-16 ENCOUNTER — Encounter (HOSPITAL_COMMUNITY)
Admission: RE | Admit: 2017-04-16 | Discharge: 2017-04-16 | Disposition: A | Discharge status: Home / Self Care | Payer: Medicare Other | Source: Skilled Nursing Facility | Attending: Pulmonary Disease | Admitting: Pulmonary Disease

## 2017-04-16 DIAGNOSIS — F3340 Major depressive disorder, recurrent, in remission, unspecified: Secondary | ICD-10-CM | POA: Insufficient documentation

## 2017-04-16 DIAGNOSIS — I5032 Chronic diastolic (congestive) heart failure: Secondary | ICD-10-CM | POA: Insufficient documentation

## 2017-04-16 DIAGNOSIS — E871 Hypo-osmolality and hyponatremia: Secondary | ICD-10-CM | POA: Insufficient documentation

## 2017-04-16 DIAGNOSIS — A0472 Enterocolitis due to Clostridium difficile, not specified as recurrent: Secondary | ICD-10-CM | POA: Insufficient documentation

## 2017-04-16 LAB — GLUCOSE, CAPILLARY
Glucose-Capillary: 83 mg/dL (ref 65–99)
Glucose-Capillary: 88 mg/dL (ref 65–99)
Glucose-Capillary: 92 mg/dL (ref 65–99)
Glucose-Capillary: 90 mg/dL (ref 65–99)

## 2017-04-16 MED ORDER — PRO-STAT SUGAR FREE PO LIQD: 30.0000 mL | Freq: Two times a day (BID) | ORAL | 0 refills | Status: DC

## 2017-04-16 MED ORDER — VANCOMYCIN 50 MG/ML ORAL SOLUTION: 125.0000 mg | Freq: Two times a day (BID) | ORAL | 0 refills | Status: DC

## 2017-04-16 NOTE — Progress Notes (Signed)
Dr [name] aware of family appealing discharge, patient will not be going home today. See MD notes..Marland Kitchen

## 2017-04-16 NOTE — Discharge Summary (Addendum)
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 I explained the her my conversation with peer to peer MD and that pt was denied SNF placement on grounds she is not compliant  With PT and per MD and insurance it would be a waste of resources .  Diet recommendation: as tolerated   History of present illness:  76 year old female with multiple UTI's over the past few weeks PTA, hospitalized at [facility] but subsequently transferred to [facility] per family request for further care. Pt also had C.diff colitis and is being treated with vanco PO. Pt has had hyponatremia, renal failure and UTI. IN addition, she had worsening mental status changes and EEG done 7/17 which showed diffuse slowing of waking cycle.  Hospital Course:   Principal Problem: Acute delirium / Acute metabolic encephalopathy - Secondary to UTI - EEG done 7/17 showed diffuse slowing of waking cycle - No acute findings on CT head    Active Problems: Enterobacter aerogenes UTI - Stopped meropenem 7/18  C.diff colitis - Vanco taper - 125 mg BID for 7 days (started 7/21), 125 mg daily for next 7 days and then 125 mg every 48 hours for following 7 days and 125 mg every 3 days fir 14 days.  Essential hypertension - Continue Cardizem and  metoprolol  Hypothyroidism - Continue synthroid    Acute renal failure superimposed on CKD stage 4 (HCC) / Hypomagnesemia - Cr 4.28 in 02/2017 - Cr within baseline range   Hypernatremia - Due to CKD - Sodium WNL  Paroxysmal A fib - CHADS vasc score 5 - Continue apixaban for AC - Continue Cardizem and metoprolol for rate control  Chronic diastolic CHF - CXR with vascular congestion  - Gotten lasix 40 mg IV on 7/17 - ECHO in 12/2015 showed preserved EF, grade 2 DD  Diabetes mellitus with diabetic nephropathy - Continue insulin per home regimen, spoke with family provided she continues regular diet at home   Anemia of chronic kidney disease - Transfused 1 U PRBC 7/19 - Hgb 8.7   DVT prophylaxis: Apixaban Code Status: full code  Family Communication: family not at bedside this am    Consultants:   PT - SNF  Procedures:   EEG 7/17 - diffuse slowing of the waking background, could indicated diffuse cerebral dysfunction  Antimicrobials:   Meropenem 7/14 -->7/18  Vanco taper   Signed:  Manson Passey, MD  [facility] 04/16/2017, 9:43 AM  Pager #: [phone]  Time spent in minutes: more than 30 minutes   Discharge Exam: Vitals:   04/16/17 0543 04/16/17 0900  BP: 126/63 (!) 126/52  Pulse: 82 84  Resp: 18 18  Temp: 98.5 F (36.9 C) 98.6 F (37 C)   Vitals:   04/16/17 0543 04/16/17 3086  04/16/17 0811 04/16/17 0900  BP: 126/63   (!) 126/52  Pulse: 82   84  Resp: 18   18  Temp: 98.5 F (36.9 C)   98.6 F (37 C)  TempSrc: Oral   Oral  SpO2: 93% 91% 91% 90%  Weight:      Height:        General: Pt is not in acute distress Cardiovascular: Regular rate and rhythm, S1/S2 + Respiratory: Clear to auscultation bilaterally, no wheezing, Abdominal: Soft, non tender, non distended, bowel sounds +, no guarding Extremities: no cyanosis, pulses palpable bilaterally DP and PT Neuro: Grossly nonfocal  Discharge  Instructions  Discharge Instructions    Call MD for:  persistant nausea and vomiting    Complete by:  As directed    Call MD for:  redness, tenderness, or signs of infection (pain, swelling, redness, odor or green/yellow discharge around incision site)    Complete by:  As directed    Call MD for:  severe uncontrolled pain    Complete by:  As directed    Diet - low sodium heart healthy    Complete by:  As directed    Discharge instructions    Complete by:  As directed    Vanco 125 mg BID through 7/28 followed by  125 mg daily from 7/29  for 7 days hrough 8/5 followed by 125 mg every 48 hours for 7 days through 8/12 followed by  125 mg every 3 days for 14 days through 8/26   Increase activity slowly    Complete by:  As directed      Allergies as of 04/16/2017      Reactions   Ciprofloxacin Shortness Of Breath   REACTION: sob,tachycardia   Doxycycline Nausea Only   Also experienced diarrhea    Fish Oil    Patient face drew to the side,Bells Palsey   Guaifenesin Shortness Of Breath   REACTION: sob,tachycardia   Mucinex [guaifenesin Er] Shortness Of Breath   Sulfamethoxazole W/trimethoprim 800-160 [sulfamethoxazole-trimethoprim] Nausea Only   Also lack of appetite    Uloric [febuxostat] Swelling   No urination   Adhesive [tape] Other (See Comments)   Causes blisters on skin   Allopurinol Other (See Comments)   Couldn't urinate   Cefuroxime Axetil Swelling   Swelling all over body-per patient she was hospitalized as a result of taking this medication   Metolazone Nausea Only   Swelling    Tramadol    insomnia      Medication List    STOP taking these medications   fosfomycin 3 g Pack Commonly known as:  MONUROL   HYDROcodone-acetaminophen 7.5-325 MG tablet Commonly known as:  NORCO     TAKE these medications   acetaminophen 325 MG tablet Commonly known as:  TYLENOL Take 2 tablets (650 mg total) by mouth every 6 (six) hours as needed for mild pain (or Fever >/=  101).   albuterol 108 (90 Base) MCG/ACT inhaler Commonly known as:  PROVENTIL HFA;VENTOLIN HFA Inhale 2 puffs into the lungs 4 (four) times daily.   allopurinol 300 MG tablet Commonly known as:  ZYLOPRIM Take 0.5 tablets (150 mg total) by mouth daily.   apixaban 5 MG Tabs tablet Commonly known as:  ELIQUIS Take 1 tablet (5 mg total) by mouth 2 (two) times daily.   BREO ELLIPTA 200-25 MCG/INH Aepb Generic drug:  fluticasone furoate-vilanterol Inhale 1 puff into the lungs daily.   calcium carbonate 500 MG chewable tablet Commonly known as:  TUMS - dosed in mg elemental calcium Chew 1 tablet by mouth 2 (two) times daily.   carboxymethylcellulose 0.5 % Soln Commonly known as:  REFRESH PLUS Apply 1 drop to eye daily as needed (for dry eyes).   cetirizine 10 MG tablet Commonly known as:  ZYRTEC Take 10 mg by mouth daily.   cholecalciferol 1000 units tablet Commonly known as:  VITAMIN D Take 1,000 Units by mouth every morning.   colchicine 0.6 MG tablet Take 1 tablet (0.6 mg total) by mouth daily.   diltiazem 120 MG 24 hr capsule Commonly known as:  CARDIZEM CD Take 1 capsule (120 mg total) by mouth daily.   Doxepin HCl 6 MG Tabs Commonly known as:  SILENOR Take 1 tablet (6 mg total) by mouth at bedtime as needed. What changed:  reasons to take this   escitalopram 20 MG tablet Commonly known as:  LEXAPRO Take 1 tablet (20 mg total) by mouth daily.   feeding supplement (PRO-STAT SUGAR FREE 64) Liqd Take 30 mLs by mouth 2 (two) times daily.   FLINTSTONES PLUS IRON chewable tablet Chew 1 tablet by mouth 2 (two) times daily.   gabapentin 100 MG capsule Commonly known as:  NEURONTIN Take 200 mg by mouth at bedtime.   insulin glargine 100 UNIT/ML injection Commonly known as:  LANTUS Inject 0.2 mLs (20 Units total) into the skin daily. What changed:  when to take this   insulin lispro 100 UNIT/ML injection Commonly known as:  HUMALOG Inject 3-20 Units into the  skin 3 (three) times daily before meals. 121-150=1 units 151-200=2 units 201-250=3 units 251-300=5units 301-350=7units 351-400=9units   levothyroxine 137 MCG tablet Commonly known as:  SYNTHROID, LEVOTHROID Take 137 mcg by mouth daily before breakfast.   loperamide 2 MG capsule Commonly known as:  IMODIUM Take 1 capsule (2 mg total) by mouth every morning. What changed:  additional instructions   metoprolol tartrate 25 MG tablet Commonly known as:  LOPRESSOR TAKE 1/2 TABLET BY MOUTH TWICE DAILY   multivitamin with minerals Tabs tablet Take 1 tablet by mouth daily.   mupirocin ointment 2 % Commonly known as:  BACTROBAN Place 1 application into the nose 2 (two) times daily.   NASACORT ALLERGY 24HR 55 MCG/ACT Aero nasal inhaler Generic drug:  triamcinolone Place 2 sprays into the nose daily.   omeprazole 20 MG capsule Commonly known as:  PRILOSEC TAKE ONE CAPSULE BY MOUTH DAILY   ondansetron 4 MG disintegrating tablet Commonly known as:  ZOFRAN-ODT Take 1 tablet (4 mg total) by mouth 3 (three) times daily before meals.   OXYGEN Inhale 2 L into the lungs daily as needed.   potassium chloride SA 20 MEQ tablet Commonly known as:  K-DUR,KLOR-CON Take 1 tablet (20 mEq total) by mouth daily.   sodium bicarbonate 650 MG tablet Take 1 tablet (650 mg total) by mouth 2 (two) times daily.   vancomycin 50 mg/mL oral solution Commonly known as:  VANCOCIN Take 2.5 mLs (125 mg total) by mouth 2 (two) times daily. What changed:  how much to take  when to take this   VENELEX Oint Apply 1 application topically See admin instructions. Apply to sacrum and bilateral buttocks every shift and as needed.       Contact information for follow-up providers    Kari Baars, MD. Schedule an appointment as soon as possible for a visit in 1 week(s).   Specialty:  Pulmonary Disease Contact information: [address] [place] [address]  [phone]             Contact information for after-discharge care    Destination    [facility] Follow up.   Specialty:  Skilled Nursing Facility Contact information: [address] [place] [phone]                   The results of significant diagnostics from this hospitalization (including imaging, microbiology, ancillary and laboratory) are listed below for reference.    Significant Diagnostic Studies: Ct Head Wo Contrast  Result Date: 04/05/2017 CLINICAL DATA:  Altered mental status with hypoxia EXAM: CT HEAD WITHOUT CONTRAST TECHNIQUE: Contiguous axial images were obtained from the base of the skull through the vertex without intravenous contrast. COMPARISON:  01/28/2017 FINDINGS: Brain: No acute territorial infarction, hemorrhage or intracranial mass is seen. Atrophy and mild small vessel ischemic changes of the white matter. Tiny lacunar infarcts, old in the bilateral basal ganglia. Nonenlarged ventricles. Vascular: No hyperdense vessels.  Carotid artery calcifications. Skull: Normal. Negative for fracture or focal lesion. Sinuses/Orbits: No acute finding. Other: None IMPRESSION: No definite CT evidence for acute intracranial abnormality. Electronically Signed   By: Jasmine PangKim  Fujinaga M.D.   On: 04/05/2017 20:53   Dg Chest Port 1 View  Result Date: 04/08/2017 CLINICAL DATA:  76 year old female with confusion and shortness of breath. EXAM: PORTABLE CHEST 1 VIEW COMPARISON:  Chest radiograph dated 04/05/2017 FINDINGS: There is cardiomegaly with probable mild vascular congestion. Bibasilar hazy densities, right greater left, have increased compared to prior study. Although this may represent progression of atelectasis, superimposed pneumonia is not excluded. Clinical correlation is recommended. Small bilateral pleural effusions may be present. There is no pneumothorax. No acute osseous pathology. IMPRESSION: 1. Cardiomegaly with mild vascular congestion. 2. Bibasilar  densities, progressed compared to the prior radiograph. Pneumonia is not excluded. Clinical correlation is recommended. Electronically Signed   By: Elgie CollardArash  Radparvar M.D.   On: 04/08/2017 04:20   Dg Abd Acute W/chest  Result Date: 04/05/2017 CLINICAL DATA:  Altered mental status, being treated for C. difficile infection with vancomycin for 5 days, history CHF, hypertension, diabetes mellitus, COPD EXAM: DG ABDOMEN ACUTE W/ 1V CHEST COMPARISON:  02/10/2017 FINDINGS: Borderline enlargement of cardiac silhouette. Mediastinal contours and pulmonary vascularity normal. Atherosclerotic calcification aortic arch. Linear subsegmental atelectasis at medial LEFT lower lobe. Lungs otherwise clear. No pleural effusion or pneumothorax. Air-filled transverse colon, nondistended. Surgical clips RIGHT upper quadrant likely cholecystectomy. No bowel dilatation, bowel wall thickening, or free air. Bones diffusely demineralized. No definite urinary tract calcification. IMPRESSION: Linear subsegmental atelectasis at medial LEFT lower lobe. No acute abdominal findings. Electronically Signed   By: Ulyses SouthwardMark  Boles M.D.   On: 04/05/2017 21:00    Microbiology: No results found for this or any previous visit (from the past 240 hour(s)).   Labs: Basic Metabolic Panel:  Recent Labs Lab 04/10/17 0242 04/11/17 0528 04/12/17 0427 04/13/17 0754 04/14/17 0304  NA 139 140 140 139 139  K 3.8 4.1 3.9 3.8 4.3  CL 111 112* 112* 111 111  CO2 20* 22 21* 21* 21*  GLUCOSE 95 94 88 89 107*  BUN 17 22* 27* 33* 38*  CREATININE 1.84* 2.06* 1.96* 1.91* 1.72*  CALCIUM 7.4* 7.9* 8.4* 8.6* 8.5*  MG  --  1.4* 1.9  --   --    Liver Function Tests: No results for input(s): AST, ALT, ALKPHOS, BILITOT, PROT, ALBUMIN in the last 168 hours. No results for input(s): LIPASE, AMYLASE  in the last 168 hours. No results for input(s): AMMONIA in the last 168 hours. CBC:  Recent Labs Lab 04/10/17 0242 04/11/17 0528 04/12/17 0427 04/13/17 0754  04/14/17 0304  WBC 7.9 8.0 8.4 7.2 8.2  HGB 7.4* 7.6* 8.7* 8.5* 8.7*  HCT 22.3* 23.3* 26.2* 26.1* 26.3*  MCV 95.3 95.9 94.6 95.3 93.6  PLT 217 246 249 237 273   Cardiac Enzymes: No results for input(s): CKTOTAL, CKMB, CKMBINDEX, TROPONINI in the last 168 hours. BNP: BNP (last 3 results)  Recent Labs  09/02/16 1539 01/28/17 1717 04/05/17 1950  BNP 29.0 80.0 157.0*    ProBNP (last 3 results) No results for input(s): PROBNP in the last 8760 hours.  CBG:  Recent Labs Lab 04/15/17 1141 04/15/17 1630 04/15/17 2003 04/15/17 2235 04/16/17 0742  GLUCAP 116* 98 95 90 83

## 2017-04-16 NOTE — Progress Notes (Addendum)
Patient's health can be seriously harmed if they have to wait for 30 days for the decision.  6 months nursing home stay would be sufficient for the patient to recuperate.  Yolanda [name] [facility] [phone]

## 2017-04-16 NOTE — Care Management Note (Signed)
Case Management Note  Patient Details  Name: Yolanda [name] MRN: [id number] Date of Birth: [birth year]  Subjective/Objective:        CM following for progression and d/c planning.             Action/Plan: 04/16/2017 Pt family has appealed pt d/c , as she has been denied SNF placement and she is clearly unable to care for herself. Family will move forward with an attempt to appeal the insurance denial for SNF. Await response from [employer] re d/c appeal.   Expected Discharge Date:  04/16/17               Expected Discharge Plan:  Skilled Nursing Facility (From [facility])  In-House Referral:  Clinical Social Work  Discharge planning Services  CM Consult  Post Acute Care Choice:    Choice offered to:     DME Arranged:    DME Agency:     HH Arranged:    HH Agency:     Status of Service:     If discussed at [employer] Meetings, dates discussed:    Additional Comments:  Starlyn SkeansRoyal, Deforest Maiden U, RN 04/16/2017, 5:10 PM

## 2017-04-16 NOTE — Clinical Social Work Note (Signed)
CSW and nurse case manager, [employer]heryl Royal talked with patient's sister, Lourdes SledgeLouise Price and her husband at the bedside regarding patient's discharge from the hospital, and appealing both the BCBS denial for short-term rehab and hospital discharge. Mrs. Price and her husband discussed the patient's recent health decline and expressed their concerns regarding patient's readiness for discharge. CSW listened and responded empathically to the family's concerns.   Mrs. Price was given the BCBS denial and appeal information and paperwork to appeal her sister's discharge from the hospital. Dr. [name] contacted by nurse case manager requesting discharge order be placed so that family can appeal discharge. CSW will continue to follow, offer information and support to family as needed and assist with discharge, once hospital appeal discharge decision rendered.  Genelle BalVanessa Lorianna Spadaccini, MSW, LCSW Licensed Clinical Social Worker Clinical Social Work Department [facility] [phone]

## 2017-04-16 NOTE — Care Management Important Message (Signed)
Important Message  Patient Details  Name: Yolanda [name] MRN: [id number] Date of Birth: July 19, 1941   Medicare Important Message Given:  Yes    Dorena BodoIris Trentyn Boisclair 04/16/2017, 11:59 AM

## 2017-04-17 LAB — GLUCOSE, CAPILLARY
Glucose-Capillary: 84 mg/dL (ref 65–99)
Glucose-Capillary: 105 mg/dL — ABNORMAL HIGH (ref 65–99)
Glucose-Capillary: 102 mg/dL — ABNORMAL HIGH (ref 65–99)
Glucose-Capillary: 97 mg/dL (ref 65–99)

## 2017-04-17 LAB — CBC
HCT: 28.6 % — ABNORMAL LOW (ref 36.0–46.0)
Hemoglobin: 9.1 g/dL — ABNORMAL LOW (ref 12.0–15.0)
MCH: 31.1 pg (ref 26.0–34.0)
MCHC: 31.8 g/dL (ref 30.0–36.0)
MCV: 97.6 fL (ref 78.0–100.0)
PLATELETS: 338 10*3/uL (ref 150–400)
RBC: 2.93 MIL/uL — AB (ref 3.87–5.11)
RDW: 17.5 % — AB (ref 11.5–15.5)
WBC: 10.8 10*3/uL — ABNORMAL HIGH (ref 4.0–10.5)

## 2017-04-17 MED ORDER — ALLOPURINOL 300 MG PO TABS
150.0000 mg | ORAL_TABLET | Freq: Every day | ORAL | Status: DC
  Administered 2017-04-18 – 2017-04-19 (×2): 150 mg via ORAL
  Filled 2017-04-17 (×2): qty 1

## 2017-04-17 MED ORDER — PREDNISONE 20 MG PO TABS
20.0000 mg | ORAL_TABLET | Freq: Every day | ORAL | Status: DC
  Administered 2017-04-18 – 2017-04-19 (×2): 20 mg via ORAL
  Filled 2017-04-17 (×2): qty 1

## 2017-04-17 NOTE — Progress Notes (Signed)
PT Cancellation Note  Patient Details Name: Yolanda MurrainBrenda W [name] MRN: [id number] DOB: 22-Oct-1940   Cancelled Treatment:    Reason Eval/Treat Not Completed: Pain limiting ability to participate.  Nsg looking into meds to assist with her comfort, but will reattempt tomorrow.   Ivar DrapeRuth E Erisha Paugh 04/17/2017, 2:52 PM   Samul Dadauth Keyuana Wank, PT MS Acute Rehab Dept. Number: [facility] [phone] and [facility] [phone]

## 2017-04-17 NOTE — Progress Notes (Signed)
Medicare [facility] of discharged [facility]/24 by patient's sister on her behalf. Provided patient with Detail of Discharge form, reviewed and signed by patient. Given to Ed[place]Natalie CMA to fax to [place]. Will await [employer] decision on appeal and follow with [facility] if needed.

## 2017-04-17 NOTE — Progress Notes (Signed)
Chart reviewed and patient examined. Not new changes seen or needed at this moment. Patient case reviewed to appeal discharge after patient was denied SNF by her insurance. Here she has completed antibiotic therapy for UTI and is currently receiving oral vancomycin for C. Diff. Complaining of pain in her legs; but minimizing narcotics due to increase lethargy. Renal function and electrolytes trending down and improving. Will monitor. Updated provided to sisters at bedside.  Vassie LollMadera, Jailine Lieder [phone]

## 2017-04-18 DIAGNOSIS — I480 Paroxysmal atrial fibrillation: Secondary | ICD-10-CM

## 2017-04-18 DIAGNOSIS — I5032 Chronic diastolic (congestive) heart failure: Secondary | ICD-10-CM

## 2017-04-18 DIAGNOSIS — N184 Chronic kidney disease, stage 4 (severe): Secondary | ICD-10-CM

## 2017-04-18 LAB — BASIC METABOLIC PANEL
Anion gap: 12 (ref 5–15)
BUN: 33 mg/dL — AB (ref 6–20)
CALCIUM: 8.7 mg/dL — AB (ref 8.9–10.3)
CO2: 20 mmol/L — ABNORMAL LOW (ref 22–32)
Chloride: 110 mmol/L (ref 101–111)
Creatinine, Ser: 1.24 mg/dL — ABNORMAL HIGH (ref 0.44–1.00)
GFR calc Af Amer: 48 mL/min — ABNORMAL LOW (ref >60)
GFR, EST NON AFRICAN AMERICAN: 42 mL/min — AB (ref >60)
Glucose, Bld: 97 mg/dL (ref 65–99)
POTASSIUM: 3.7 mmol/L (ref 3.5–5.1)
SODIUM: 142 mmol/L (ref 135–145)

## 2017-04-18 LAB — CBC
HEMATOCRIT: 29.8 % — AB (ref 36.0–46.0)
Hemoglobin: 9.4 g/dL — ABNORMAL LOW (ref 12.0–15.0)
MCH: 30.8 pg (ref 26.0–34.0)
MCHC: 31.5 g/dL (ref 30.0–36.0)
MCV: 97.7 fL (ref 78.0–100.0)
PLATELETS: 358 10*3/uL (ref 150–400)
RBC: 3.05 MIL/uL — ABNORMAL LOW (ref 3.87–5.11)
RDW: 17.6 % — AB (ref 11.5–15.5)
WBC: 13.1 10*3/uL — AB (ref 4.0–10.5)

## 2017-04-18 LAB — GLUCOSE, CAPILLARY
GLUCOSE-CAPILLARY: 100 mg/dL — AB (ref 65–99)
GLUCOSE-CAPILLARY: 128 mg/dL — AB (ref 65–99)
Glucose-Capillary: 144 mg/dL — ABNORMAL HIGH (ref 65–99)
Glucose-Capillary: 137 mg/dL — ABNORMAL HIGH (ref 65–99)

## 2017-04-18 MED ORDER — ALBUTEROL SULFATE (2.5 MG/3ML) 0.083% IN NEBU: 3.0000 mL | INHALATION_SOLUTION | RESPIRATORY_TRACT | Status: DC | PRN

## 2017-04-18 MED ORDER — FUROSEMIDE 20 MG PO TABS
30.0000 mg | ORAL_TABLET | Freq: Every day | ORAL | Status: DC
  Administered 2017-04-19: 30 mg via ORAL
  Filled 2017-04-18: qty 2

## 2017-04-18 MED ORDER — FUROSEMIDE 40 MG PO TABS: 40.0000 mg | ORAL_TABLET | Freq: Every day | ORAL | Status: DC

## 2017-04-18 NOTE — Clinical Social Work Note (Signed)
CSW worked on private pay facility placement today. Talked with Erich Montanehris Dudley, admissions director with [facility] regarding patient admitting to their facility private pay for rehab. CSW provided with cost information to share with sister, Lourdes SledgeLouise Price, however per Thayer OhmChris he does not know if he will have a female bed on Friday. Contact made with sister Lourdes SledgeLouise Price and update provided on [facility] and information on room & board costs. Sister requested that [facility]. Call made to [facility], [facility] regarding patient CSW awaiting f/u call.   CSW talked with 2 of patient's sister's on 6E, outside of the room and was informed that they will take patient home tomorrow. [facility] so that they can look into assistance with caring for patient at home. CSW will f/u with nurse case manager on Friday regarding sister's request. Patient will d/c home on Friday, transported by ambulance.  Genelle BalVanessa Kealani Leckey, MSW, LCSW Licensed Clinical Social Worker Clinical Social Work Department [facility] [phone]

## 2017-04-18 NOTE — Progress Notes (Signed)
PT Cancellation Note  Patient Details Name: Yolanda MurrainBrenda W [name] MRN: [id number] DOB: [birth year]   Cancelled Treatment:    Reason Eval/Treat Not Completed: Pain limiting ability to participate.  Pt is having gouty pain per her report and talked with case manager about her symptoms to see what her plan is.  Pt is going through a request from family to see if she can get SNF coverage, but may need to go home.  Her therapy will need to include some family education for her care and transfers if so.   Ivar DrapeRuth E Versie Fleener 04/18/2017, 1:47 PM   Samul Dadauth Layonna Dobie, PT MS Acute Rehab Dept. Number: [facility] [phone] and [facility] [phone]

## 2017-04-18 NOTE — Care Management Note (Signed)
Case Management Note  Patient Details  Name: Ashley MurrainBrenda W Fulbright MRN: [id number] Date of Birth: 1941-04-19  Subjective/Objective:      CM following for progression and d/c planning.               Action/Plan: 04/18/2017 [facility] and they have determined that this pt is ready to be d/c from the hospital . Family aware and are currently planning to privately pay for this pt to d/c to a SNF. D/C must take place by tomorrow.  CSW, Alvina ChouV Crawford  working with family on placement [place].   Expected Discharge Date:  04/19/2017               Expected Discharge Plan:  Skilled Nursing Facility ([facility])  In-House Referral:  Clinical Social Work  Discharge planning Services  CM Consult  Post Acute Care Choice:  NA Choice offered to:  NA  DME Arranged:  N/A DME Agency:  NA  HH Arranged:   NA HH Agency:  NA  Status of Service:  Completed, signed off  If discussed at Long Length of Stay Meetings, dates discussed:    Additional Comments:  Starlyn SkeansRoyal, Shakina Choy U, RN 04/18/2017, 3:12 PM

## 2017-04-18 NOTE — Progress Notes (Signed)
[facility] PROGRESS NOTE  Yolanda [name] UJW:[id number] DOB: 1941-08-02 DOA: 04/05/2017 PCP: Kari BaarsHawkins, Edward, MD  Interim summary and HPI 76 year old female with multiple UTI's over the past few weeks PTA, hospitalized at [facility] but [facility] for further care. Pt also had C.diff colitis and is being treated with vanco PO. Pt has had hyponatremia, renal failure and UTI. Treated for enterobacter UTI here and now her renal function and electrolytes has essentially return to her baseline. Mentation also improved. PT recommended SNF, but insurance denied it and family appeal discharge. Now looking to pay out of pocket for SNF while fighting insurance decision. Update D/C summary done by Dr. [name] as needed. Here below is and update of 2 days post discharge and changes done.  Assessment/Plan: 1-acute delirium/metabolic/toxic encephalopathy  -secondary to UTI and polypharmacy -treatment for UTI completed -will minimize narcotics and the use of sedatives -EEG r/o epilepsy -CT head neg -mentation improved significantly  -will continue constant reorientation   2-enterobacter aerogenes UTI -treated with 5 days of meropenem -no dysuria and no fever -advise to maintain adequate hydration   3-C. Diff colitis -patient following vancomycin tapering  - Vanco taper - 125 mg BID for 7 days (started 7/21), 125 mg daily for next 7 days and then 125 mg every 48 hours for following 7 days and 125 mg every 3 days fir 14 days. -no diarrhea and denies abd pain  4-essential HTN -stable and well controlled -will continue current antihypertensive regimen   5-Paroxysmal A. Fib -chronic -CHADSVASC score 4-5 -continue eliquis -continue cardizem and metoprolol for rate control  6-acute on chronic renal failure -Cr improved and back to baseline -will monitor intermittently  7-hypothyroidism -will continue synthroid  8-chronic diastolic heart failure -overall  compensated -follow daily weights -educated about low sodium diet -will continue b-blocker -resume lasix  9-diabetes mellitus with nephropathy -stable -will continue home medications  10- Anemia of chronic kidney disease - Transfused 1 U PRBC 7/19 -no acute bleeding  -Hgb stable and in the 9.4 range  11-gout flare -treating with prednisone for 5 days (20mg  daily) -allopurinol also resume  Code Status: Full Family Communication: no family at bedside today; [name] sisters updated on 7/25 Disposition Plan: patient is medically stable for discharge. Recommending as instructed by PT SNF for further care and rehabilitation. Continue vancomycin tapering. Finish 5 days of prednisone for gout flare. Follow renal function.   Consultants:  None   Procedures:  See below for x-ray reports  EEG 7/17 - diffuse slowing of the waking background, could indicated diffuse cerebral dysfunction  Antibiotics:  Patient completed treatment with meropenem   Continue receiving vancomycin tapering for C . Diff  HPI/Subjective: Afebrile, with improved mentation and complaining of mild pain in her feet from gout. No CP, no SOB.  Objective: Vitals:   04/18/17 1000 04/18/17 1704  BP: 135/60 (!) 131/58  Pulse: (!) 101 81  Resp: 20 20  Temp: 98 F (36.7 C) 98.2 F (36.8 C)    Intake/Output Summary (Last 24 hours) at 04/18/17 2008 Last data filed at 04/18/17 1800  Gross per 24 hour  Intake              480 ml  Output                0 ml  Net              480 ml   Filed Weights   04/15/17 2031  04/16/17 2104 04/17/17 2122  Weight: 99.3 kg (219 lb) 97.5 kg (215 lb) 97.2 kg (214 lb 4.6 oz)    Exam:   General: afebrile, no CP, no palpitations, AAOX3 and in no distress. Patient denies nausea, vomiting and diarrhea.  Cardiovascular: rate controlled, no rubs, no gallops  Respiratory: no using accessory muscles, good air movement bilaterally, no wheezing  Abdomen: soft, NT, ND, positive  BS  Musculoskeletal: 1+ LE edema and also trace edema appreciated on her upper extremities   Psychiatry: normal mood, AAOX3, good interaction and following commands appropriately   Data Reviewed: Basic Metabolic Panel:  Recent Labs Lab 04/12/17 0427 04/13/17 0754 04/14/17 0304 04/18/17 0338  NA 140 139 139 142  K 3.9 3.8 4.3 3.7  CL 112* 111 111 110  CO2 21* 21* 21* 20*  GLUCOSE 88 89 107* 97  BUN 27* 33* 38* 33*  CREATININE 1.96* 1.91* 1.72* 1.24*  CALCIUM 8.4* 8.6* 8.5* 8.7*  MG 1.9  --   --   --    CBC:  Recent Labs Lab 04/12/17 0427 04/13/17 0754 04/14/17 0304 04/17/17 0656 04/18/17 0338  WBC 8.4 7.2 8.2 10.8* 13.1*  HGB 8.7* 8.5* 8.7* 9.1* 9.4*  HCT 26.2* 26.1* 26.3* 28.6* 29.8*  MCV 94.6 95.3 93.6 97.6 97.7  PLT 249 237 273 338 358   BNP (last 3 results)  Recent Labs  09/02/16 1539 01/28/17 1717 04/05/17 1950  BNP 29.0 80.0 157.0*    CBG:  Recent Labs Lab 04/17/17 1643 04/17/17 2111 04/18/17 0719 04/18/17 1223 04/18/17 1608  GLUCAP 102* 97 100* 144* 137*    Studies: No results found.  Scheduled Meds: . allopurinol  150 mg Oral Daily  . apixaban  5 mg Oral BID  . calcium carbonate  1 tablet Oral BID  . diltiazem  120 mg Oral Daily  . feeding supplement (PRO-STAT SUGAR FREE 64)  30 mL Oral BID  . fluticasone furoate-vilanterol  1 puff Inhalation Daily  . gabapentin  200 mg Oral QHS  . insulin aspart  0-15 Units Subcutaneous TID WC  . insulin aspart  0-5 Units Subcutaneous QHS  . levothyroxine  137 mcg Oral QAC breakfast  . metoprolol tartrate  12.5 mg Oral BID  . predniSONE  20 mg Oral Q breakfast  . sodium bicarbonate  650 mg Oral BID  . vancomycin  125 mg Oral Q12H   Followed by  . [START ON 04/20/2017] vancomycin  125 mg Oral Daily   Followed by  . [START ON 04/28/2017] vancomycin  125 mg Oral Q48H   Followed by  . [START ON 05/07/2017] vancomycin  125 mg Oral Q72H    Time spent: 25 minutes    Vassie LollMadera, Braylee Lal  [facility] Pager [phone]. If 7PM-7AM, please contact night-coverage at [email or web address], password [email or web address] 04/18/2017, 8:08 PM  LOS: 12 days

## 2017-04-19 DIAGNOSIS — E8770 Fluid overload, unspecified: Secondary | ICD-10-CM

## 2017-04-19 LAB — GLUCOSE, CAPILLARY
Glucose-Capillary: 87 mg/dL (ref 65–99)
Glucose-Capillary: 110 mg/dL — ABNORMAL HIGH (ref 65–99)

## 2017-04-19 MED ORDER — GABAPENTIN 100 MG PO CAPS: 200.0000 mg | ORAL_CAPSULE | Freq: Every day | ORAL | 0 refills | Status: DC

## 2017-04-19 MED ORDER — VANCOMYCIN 50 MG/ML ORAL SOLUTION: 125.0000 mg | Freq: Two times a day (BID) | ORAL | 0 refills | Status: DC

## 2017-04-19 MED ORDER — SODIUM BICARBONATE 650 MG PO TABS: 650.0000 mg | ORAL_TABLET | Freq: Two times a day (BID) | ORAL | 0 refills | Status: DC

## 2017-04-19 MED ORDER — PREDNISONE 20 MG PO TABS: 20.0000 mg | ORAL_TABLET | Freq: Every day | ORAL | 0 refills | Status: AC

## 2017-04-19 MED ORDER — COLCHICINE 0.6 MG PO TABS: 0.6000 mg | ORAL_TABLET | Freq: Every day | ORAL | 0 refills | Status: DC

## 2017-04-19 MED ORDER — POTASSIUM CHLORIDE CRYS ER 20 MEQ PO TBCR: 20.0000 meq | EXTENDED_RELEASE_TABLET | Freq: Every day | ORAL | 0 refills | Status: DC

## 2017-04-19 MED ORDER — FUROSEMIDE 20 MG PO TABS: 20.0000 mg | ORAL_TABLET | Freq: Every day | ORAL | 0 refills | Status: DC

## 2017-04-19 MED ORDER — DOXEPIN HCL 6 MG PO TABS: 6.0000 mg | ORAL_TABLET | Freq: Every evening | ORAL | 0 refills | Status: DC | PRN

## 2017-04-19 NOTE — Care Management (Signed)
    Durable Medical Equipment        Start     Ordered   04/19/17 0000  DME Hospital bed    Question Answer Comment  Patient has (list medical condition): encephalopathy, weakness, diarrhea   The above medical condition requires: Patient requires the ability to reposition frequently   Head must be elevated greater than: 30 degrees   Bed type Semi-electric      04/19/17 1127

## 2017-04-19 NOTE — Care Management Note (Signed)
Case Management Note  Patient Details  Name: Yolanda [name] MRN: [id number] Date of Birth: 1941/02/22  Subjective/Objective:     CM following for progression and d/c planning.               Action/Plan: 04/19/2017 Notified by CSW that family plan now is to [facility] this [facility] family support. This CM spoke with pt sister and POA Lourdes SledgeLouise Price. She would like to have a hospital bed if insurance will cover, [facility] and are attempting to qualify this pt for a hospital bed in the home. Family has a walker and 3:1 commode. Have asked [facility] to [facility] RN, PT, OT, aide and Child psychotherapistsocial worker. [facility] as [facility] are preferred provider for [employer] medicare. Philis FendtMary Y of [facility] notified and services will begin on Monday, April 22, 2017.   Expected Discharge Date:  04/19/2017               Expected Discharge Plan:  Home w Home Health Services ([facility])  In-House Referral:  Clinical Social Work  Discharge planning Services  CM Consult  Post Acute Care Choice:  Durable Medical Equipment, Home Health Choice offered to:  Sibling  DME Arranged:  Hospital bed DME Agency:  [facility]  HH Arranged:  RN, PT, OT, Nurse's Aide, Social Work [facility]  [facility]  Status of Service:  Completed, signed off  If discussed at [employer] of [employer] Meetings, dates discussed:    Additional Comments:  Starlyn SkeansRoyal, Allis Quirarte U, RN 04/19/2017, 10:26 AM

## 2017-04-19 NOTE — Progress Notes (Signed)
Patient Discharge: Disposition:  Patient discharged to home with home health.  Education: Reviewed medications, prescriptions, and discharge instructions, understood and acknowledged. IV: N/A Telemetry: N/A Transportation: Patient transported via ambulance and took all her belongings with her.

## 2017-04-19 NOTE — Care Management (Signed)
DME 

## 2017-04-19 NOTE — Discharge Summary (Addendum)
Physician Discharge Summary  Yolanda [name] [id number] DOB: Nov 14, 1940 DOA: 04/05/2017  PCP: Kari BaarsHawkins, Edward, MD  Admit date: 04/05/2017 Discharge date: 04/19/2017  Admitted From:SNF Disposition:home with home care  Recommendations for Outpatient Follow-up:  1. Follow up with PCP in 1-2 weeks 2. Please obtain BMP/CBC in one week   Home Health:yes Equipment/Devices:hospital bed Discharge Condition:stable CODE STATUS:full code Diet recommendation: carb modified heart healthy  Brief/Interim Summary: 76 year old female with history of sensitive, diabetes, GERD, hypothyroidism, diastolic congestive heart failure admitted with confusion and kidney failure. Patient was discharged a few days ago however did not keep because of skilled nursing facility evaluation and later denied. Discussed with the social worker and case manager, patient is going home with home care services.  Acute metabolic encephalopathy likely in the setting of UTI and will pharmacy. EEG ruled out epilepsy. CT head negative. Mental status improved. Patient was alert and oriented 3. No focal neurological deficit.  Enterobacter UTI treated with 5 days of meropenem. Clinically improved.  C. difficile colitis: Patient is on tapering oral vancomycin. Reported no diarrhea or abdominal pain. Abdomen exam benign.  Continued home medication for the management of essential hypertension, paroxysmal atrial fibrillation, hypothyroidism, chronic diastolic congestive heart failure, diabetes mellitus with nephropathy. Patient developed acute kidney injury on chronic kidney disease is stage III which improved on discharge. On low-dose prednisone for 5 days for acute gout flare. Low dose lasix for LE pitting edema.  Patient is clinically stable for discharge home with home care services. Recommended outpatient follow-up with PCP in 1 week.    Discharge Diagnoses:  Principal Problem:   Delirium Active Problems:   Hypothyroidism    AKI (acute kidney injury) (HCC)   Obesity   Acute encephalopathy   Diabetes mellitus type 2 in obese [facility](HCC)   UTI (urinary tract infection)   Fluid overload    Discharge Instructions  Discharge Instructions    Call MD for:  persistant nausea and vomiting    Complete by:  As directed    Call MD for:  redness, tenderness, or signs of infection (pain, swelling, redness, odor or green/yellow discharge around incision site)    Complete by:  As directed    Call MD for:  severe uncontrolled pain    Complete by:  As directed    DME Hospital bed    Complete by:  As directed    Patient has (list medical condition):  encephalopathy, weakness, diarrhea   The above medical condition requires:  Patient requires the ability to reposition frequently   Head must be elevated greater than:  30 degrees   Bed type:  Semi-electric   Diet - low sodium heart healthy    Complete by:  As directed    Discharge instructions    Complete by:  As directed    Vanco 125 mg BID through 7/28 followed by  125 mg daily from 7/29  for 7 days hrough 8/5 followed by 125 mg every 48 hours for 7 days through 8/12 followed by  125 mg every 3 days for 14 days through 8/26   Face-to-face encounter (required for Medicare/Medicaid patients)    Complete by:  As directed    I Jaydrian Corpening Jaynie CollinsPrasad Yehudis Monceaux certify that this patient is under my care and that I, or a nurse practitioner or physician's assistant working with me, had a face-to-face encounter that meets the physician face-to-face encounter requirements with this patient on 04/19/2017. The encounter with the patient was in whole, or in part for the  following medical condition(s) which is the primary reason for home health care (List medical condition): weakness, encephalopathy   The encounter with the patient was in whole, or in part, for the following medical condition, which is the primary reason for home health care:  weakness, encephalopathy   I certify that, based on my  findings, the following services are medically necessary home health services:   Nursing Physical therapy     Reason for Medically Necessary Home Health Services:  Skilled Nursing- Skilled Assessment/Observation   My clinical findings support the need for the above services:  Cognitive impairments, dementia, or mental confusion  that make it unsafe to leave home   Further, I certify that my clinical findings support that this patient is homebound due to:  Unable to leave home safely without assistance   Home Health    Complete by:  As directed    To provide the following care/treatments:   PT OT RN Home Health Aide Social work     Increase activity slowly    Complete by:  As directed      Allergies as of 04/19/2017      Reactions   Ciprofloxacin Shortness Of Breath   REACTION: sob,tachycardia   Doxycycline Nausea Only   Also experienced diarrhea    Fish Oil    Patient face drew to the side,Bells Palsey   Guaifenesin Shortness Of Breath   REACTION: sob,tachycardia   Mucinex [guaifenesin Er] Shortness Of Breath   Sulfamethoxazole W/trimethoprim 800-160 [sulfamethoxazole-trimethoprim] Nausea Only   Also lack of appetite    Uloric [febuxostat] Swelling   No urination   Adhesive [tape] Other (See Comments)   Causes blisters on skin   Allopurinol Other (See Comments)   Couldn't urinate   Cefuroxime Axetil Swelling   Swelling all over body-per patient she was hospitalized as a result of taking this medication   Metolazone Nausea Only   Swelling    Tramadol    insomnia      Medication List    STOP taking these medications   fosfomycin 3 g Pack Commonly known as:  MONUROL   HYDROcodone-acetaminophen 7.5-325 MG tablet Commonly known as:  NORCO     TAKE these medications   acetaminophen 325 MG tablet Commonly known as:  TYLENOL Take 2 tablets (650 mg total) by mouth every 6 (six) hours as needed for mild pain (or Fever >/= 101).   albuterol 108 (90 Base) MCG/ACT  inhaler Commonly known as:  PROVENTIL HFA;VENTOLIN HFA Inhale 2 puffs into the lungs 4 (four) times daily.   allopurinol 300 MG tablet Commonly known as:  ZYLOPRIM Take 0.5 tablets (150 mg total) by mouth daily.   apixaban 5 MG Tabs tablet Commonly known as:  ELIQUIS Take 1 tablet (5 mg total) by mouth 2 (two) times daily.   BREO ELLIPTA 200-25 MCG/INH Aepb Generic drug:  fluticasone furoate-vilanterol Inhale 1 puff into the lungs daily.   calcium carbonate 500 MG chewable tablet Commonly known as:  TUMS - dosed in mg elemental calcium Chew 1 tablet by mouth 2 (two) times daily.   carboxymethylcellulose 0.5 % Soln Commonly known as:  REFRESH PLUS Apply 1 drop to eye daily as needed (for dry eyes).   cetirizine 10 MG tablet Commonly known as:  ZYRTEC Take 10 mg by mouth daily.   cholecalciferol 1000 units tablet Commonly known as:  VITAMIN D Take 1,000 Units by mouth every morning.   colchicine 0.6 MG tablet Take 1 tablet (0.6 mg total)  by mouth daily.   diltiazem 120 MG 24 hr capsule Commonly known as:  CARDIZEM CD Take 1 capsule (120 mg total) by mouth daily.   Doxepin HCl 6 MG Tabs Commonly known as:  SILENOR Take 1 tablet (6 mg total) by mouth at bedtime as needed (for sleep).   escitalopram 20 MG tablet Commonly known as:  LEXAPRO Take 1 tablet (20 mg total) by mouth daily.   feeding supplement (PRO-STAT SUGAR FREE 64) Liqd Take 30 mLs by mouth 2 (two) times daily.   FLINTSTONES PLUS IRON chewable tablet Chew 1 tablet by mouth 2 (two) times daily.   furosemide 20 MG tablet Commonly known as:  LASIX Take 1 tablet (20 mg total) by mouth daily.   gabapentin 100 MG capsule Commonly known as:  NEURONTIN Take 2 capsules (200 mg total) by mouth at bedtime.   insulin glargine 100 UNIT/ML injection Commonly known as:  LANTUS Inject 0.2 mLs (20 Units total) into the skin daily. What changed:  when to take this   insulin lispro 100 UNIT/ML  injection Commonly known as:  HUMALOG Inject 3-20 Units into the skin 3 (three) times daily before meals. 121-150=1 units 151-200=2 units 201-250=3 units 251-300=5units 301-350=7units 351-400=9units   levothyroxine 137 MCG tablet Commonly known as:  SYNTHROID, LEVOTHROID Take 137 mcg by mouth daily before breakfast.   loperamide 2 MG capsule Commonly known as:  IMODIUM Take 1 capsule (2 mg total) by mouth every morning. What changed:  additional instructions   metoprolol tartrate 25 MG tablet Commonly known as:  LOPRESSOR TAKE 1/2 TABLET BY MOUTH TWICE DAILY   multivitamin with minerals Tabs tablet Take 1 tablet by mouth daily.   mupirocin ointment 2 % Commonly known as:  BACTROBAN Place 1 application into the nose 2 (two) times daily.   NASACORT ALLERGY 24HR 55 MCG/ACT Aero nasal inhaler Generic drug:  triamcinolone Place 2 sprays into the nose daily.   omeprazole 20 MG capsule Commonly known as:  PRILOSEC TAKE ONE CAPSULE BY MOUTH DAILY   ondansetron 4 MG disintegrating tablet Commonly known as:  ZOFRAN-ODT Take 1 tablet (4 mg total) by mouth 3 (three) times daily before meals.   OXYGEN Inhale 2 L into the lungs daily as needed.   potassium chloride SA 20 MEQ tablet Commonly known as:  K-DUR,KLOR-CON Take 1 tablet (20 mEq total) by mouth daily.   predniSONE 20 MG tablet Commonly known as:  DELTASONE Take 1 tablet (20 mg total) by mouth daily with breakfast.   sodium bicarbonate 650 MG tablet Take 1 tablet (650 mg total) by mouth 2 (two) times daily.   vancomycin 50 mg/mL oral solution Commonly known as:  VANCOCIN Take 2.5 mLs (125 mg total) by mouth 2 (two) times daily. What changed:  how much to take  when to take this   VENELEX Oint Apply 1 application topically See admin instructions. Apply to sacrum and bilateral buttocks every shift and as needed.            Durable Medical Equipment        Start     Ordered   04/19/17 0000  DME Hospital  bed    Question Answer Comment  Patient has (list medical condition): encephalopathy, weakness, diarrhea   The above medical condition requires: Patient requires the ability to reposition frequently   Head must be elevated greater than: 30 degrees   Bed type Semi-electric      04/19/17 1127      Contact information  for follow-up providers    Kari Baars, MD. Schedule an appointment as soon as possible for a visit in 1 week(s).   Specialty:  Pulmonary Disease Why:  Appointment date on 04/25/2017 9:00a Contact information: [address] [place] [address] [phone]            Contact information for after-discharge care    Destination    [facility] Follow up.   Specialty:  Skilled Nursing Facility Contact information: [address] [place] [address] [phone]                 Allergies  Allergen Reactions  . Ciprofloxacin Shortness Of Breath    REACTION: sob,tachycardia  . Doxycycline Nausea Only    Also experienced diarrhea   . Fish Oil     Patient face drew to the side,Bells Palsey  . Guaifenesin Shortness Of Breath    REACTION: sob,tachycardia  . Mucinex [Guaifenesin Er] Shortness Of Breath  . Sulfamethoxazole W/Trimethoprim 800-160 [Sulfamethoxazole-Trimethoprim] Nausea Only    Also lack of appetite   . Uloric [Febuxostat] Swelling    No urination  . Adhesive [Tape] Other (See Comments)    Causes blisters on skin  . Allopurinol Other (See Comments)    Couldn't urinate  . Cefuroxime Axetil Swelling    Swelling all over body-per patient she was hospitalized as a result of taking this medication  . Metolazone Nausea Only    Swelling   . Tramadol     insomnia    Consultations: none  Procedures/Studies: none  Subjective: Seen and examined at bedside. Patient is alert awake and oriented 3. Denied headache, dizziness, nausea vomiting, chest pain or shortness of breath.  Discharge  Exam: Vitals:   04/19/17 0522 04/19/17 0934  BP: 124/71 (!) 115/52  Pulse: 89 77  Resp: 20 19  Temp: 98.9 F (37.2 C) 98.6 F (37 C)   Vitals:   04/18/17 1704 04/18/17 2116 04/19/17 0522 04/19/17 0934  BP: (!) 131/58 130/62 124/71 (!) 115/52  Pulse: 81 89 89 77  Resp: 20 19 20 19   Temp: 98.2 F (36.8 C) 98.6 F (37 C) 98.9 F (37.2 C) 98.6 F (37 C)  TempSrc: Oral Oral Oral Oral  SpO2: 98% 95% 98% 94%  Weight:  97.4 kg (214 lb 11.7 oz)    Height:        General: Pt is alert, awake, not in acute distress Cardiovascular: RRR, S1/S2 +, no rubs, no gallops Respiratory: CTA bilaterally, no wheezing, no rhonchi Abdominal: Soft, NT, ND, bowel sounds + Extremities: Bilateral trace pitting edema. Neurologic: Alert, awake, oriented 3, nonfocal neurological exam    The results of significant diagnostics from this hospitalization (including imaging, microbiology, ancillary and laboratory) are listed below for reference.     Microbiology: No results found for this or any previous visit (from the past 240 hour(s)).   Labs: BNP (last 3 results)  Recent Labs  09/02/16 1539 01/28/17 1717 04/05/17 1950  BNP 29.0 80.0 157.0*   Basic Metabolic Panel:  Recent Labs Lab 04/13/17 0754 04/14/17 0304 04/18/17 0338  NA 139 139 142  K 3.8 4.3 3.7  CL 111 111 110  CO2 21* 21* 20*  GLUCOSE 89 107* 97  BUN 33* 38* 33*  CREATININE 1.91* 1.72* 1.24*  CALCIUM 8.6* 8.5* 8.7*   Liver Function Tests: No results for input(s): AST, ALT, ALKPHOS, BILITOT, PROT, ALBUMIN in the last 168 hours. No results for input(s): LIPASE,  AMYLASE in the last 168 hours. No results for input(s): AMMONIA in the last 168 hours. CBC:  Recent Labs Lab 04/13/17 0754 04/14/17 0304 04/17/17 0656 04/18/17 0338  WBC 7.2 8.2 10.8* 13.1*  HGB 8.5* 8.7* 9.1* 9.4*  HCT 26.1* 26.3* 28.6* 29.8*  MCV 95.3 93.6 97.6 97.7  PLT 237 273 338 358   Cardiac Enzymes: No results for input(s): CKTOTAL, CKMB,  CKMBINDEX, TROPONINI in the last 168 hours. BNP: Invalid input(s): POCBNP CBG:  Recent Labs Lab 04/18/17 1223 04/18/17 1608 04/18/17 2111 04/19/17 0740 04/19/17 1216  GLUCAP 144* 137* 128* 87 110*   D-Dimer No results for input(s): DDIMER in the last 72 hours. Hgb A1c No results for input(s): HGBA1C in the last 72 hours. Lipid Profile No results for input(s): CHOL, HDL, LDLCALC, TRIG, CHOLHDL, LDLDIRECT in the last 72 hours. Thyroid function studies No results for input(s): TSH, T4TOTAL, T3FREE, THYROIDAB in the last 72 hours.  Invalid input(s): FREET3 Anemia work up No results for input(s): VITAMINB12, FOLATE, FERRITIN, TIBC, IRON, RETICCTPCT in the last 72 hours. Urinalysis    Component Value Date/Time   COLORURINE AMBER (A) 04/05/2017 1950   APPEARANCEUR TURBID (A) 04/05/2017 1950   LABSPEC 1.012 04/05/2017 1950   PHURINE 5.0 04/05/2017 1950   GLUCOSEU NEGATIVE 04/05/2017 1950   HGBUR SMALL (A) 04/05/2017 1950   BILIRUBINUR NEGATIVE 04/05/2017 1950   KETONESUR NEGATIVE 04/05/2017 1950   PROTEINUR 30 (A) 04/05/2017 1950   NITRITE NEGATIVE 04/05/2017 1950   LEUKOCYTESUR LARGE (A) 04/05/2017 1950   Sepsis Labs Invalid input(s): PROCALCITONIN,  WBC,  LACTICIDVEN Microbiology No results found for this or any previous visit (from the past 240 hour(s)).   Time coordinating discharge: 30 minutes  SIGNED:   Maxie Barbron Prasad Hermione Havlicek, MD  [facility] 04/19/2017, 7:06 PM  If 7PM-7AM, please contact night-coverage [email or web address] Password [email or web address]

## 2017-04-19 NOTE — Progress Notes (Signed)
Physical Therapy Treatment Patient Details Name: Yolanda MurrainBrenda W Linder MRN: [id number] DOB: 07-Jun-1941 Today's Date: 04/19/2017    History of Present Illness 76 yo white female admitted for recurrent UTI and fragile skin with periph edema, recent c-diff, renal failure, low Na+, CHF and anemia related to CKD.  PMHx: SNF stay, falls, CHF, PAF, DM, PN, CKD, HTN, UTI's    PT Comments    Pt continues to be severely limited with mobility 2/2 generalized weakness and pain in LEs.  Planned to address family education for d/c home, however family not present this session.  Focus on bed mobility and self care tasks with hygiene for incontinent bowel/bladder, and bathing.  Discussed d/c home with pt and provided education for pt performing as much mobility as physically able to assist family with her care, as well as made recommendations for DME and f/u with HHPT and pt verbalized understanding.  At this point, continue to strongly recommend post acute rehab (per documentation, insurance has denied initial request and family appeal) for progression of strength and mobility prior to returning home.  Feel that this pt is a high fall risk and high readmission risk based on current level of function.      Follow Up Recommendations  SNF     Equipment Recommendations  Wheelchair (measurements PT);Wheelchair cushion (measurements PT);Hospital bed (22x18" w/c with pressure relieving cushion)    Recommendations for Other Services       Precautions / Restrictions Precautions Precautions: Fall Precaution Comments: use care with moving LE's Restrictions Weight Bearing Restrictions: No    Mobility  Bed Mobility Overal bed mobility: Needs Assistance Bed Mobility: Rolling Rolling: Mod assist         General bed mobility comments: Requires mod assist to initiate and multimodal cues for use of UEs/LEs to assist with rolling and cues to maintain side lying for peri-care  Transfers                     Ambulation/Gait                 Stairs            Wheelchair Mobility    Modified Rankin (Stroke Patients Only)       Balance                                            Cognition Arousal/Alertness: Awake/alert Behavior During Therapy: WFL for tasks assessed/performed Overall Cognitive Status: No family/caregiver present to determine baseline cognitive functioning                     Current Attention Level: Selective   Following Commands: Follows multi-step commands consistently Safety/Judgement: Decreased awareness of safety;Decreased awareness of deficits            Exercises      General Comments        Pertinent Vitals/Pain Pain Assessment:  (reports pain in ankles with weight bearing but does not rate) Pain Intervention(s): Monitored during session;Repositioned;Limited activity within patient's tolerance    Home Living                      Prior Function            PT Goals (current goals can now be found in the care plan section) Acute Rehab PT Goals PT  Goal Formulation: With patient Time For Goal Achievement: 04/25/17 Potential to Achieve Goals: Fair Progress towards PT goals: Progressing toward goals    Frequency    Min 2X/week      PT Plan Current plan remains appropriate    Co-evaluation              AM-PAC PT "6 Clicks" Daily Activity  Outcome Measure  Difficulty turning over in bed (including adjusting bedclothes, sheets and blankets)?: A Lot Difficulty moving from lying on back to sitting on the side of the bed? : A Lot Difficulty sitting down on and standing up from a chair with arms (e.g., wheelchair, bedside commode, etc,.)?: Total Help needed moving to and from a bed to chair (including a wheelchair)?: Total Help needed walking in hospital room?: Total Help needed climbing 3-5 steps with a railing? : Total 6 Click Score: 8    End of Session   Activity Tolerance:  Patient tolerated treatment well Patient left: in bed;with bed alarm set;with call bell/phone within reach Nurse Communication: Mobility status;Other (comment) (d/c planning ) PT Visit Diagnosis: Muscle weakness (generalized) (M62.81)     Time: 1120-1200 PT Time Calculation (min) (ACUTE ONLY): 40 min  Charges:  $Therapeutic Activity: 38-52 mins                    G Codes:       Teodoro Kilaitlin Penven-Crew, PT, DPT  04/19/17 12:14 PM

## 2017-04-19 NOTE — Progress Notes (Signed)
Patient's sister called regarding prescriptions for medications which patient didn't have at home because patient was in rehab before.  Paged Dr. Ronalee BeltsBhandari, he faxed the medications to the patient's pharmacy.  Called the sister Sallye OberLouise and notified that Dr faxed the prescriptions and if there is any problem can contact the PCP or the unit tomorrow.  Sister was very appreciative of the work we are doing and was very thankful.

## 2017-04-19 NOTE — Clinical Social Work Note (Signed)
CSW talked with Elease HashimotoPatricia, admissions Interior and spatial designerdirector at [facility] regarding patient. Per Elease HashimotoPatricia, she talked with patient's sister on yesterday and is unable to accept patient at this time. CSW signing off as [name] will discharge [facility] today [facility]. Patient's sister Lourdes SledgeLouise Price is pursuing an appeal of [employer] denial of SNF placement.  Genelle BalVanessa Elisia Stepp, MSW, LCSW Licensed Clinical Social Worker Clinical Social Work Department [facility] [phone]

## 2017-04-20 NOTE — Progress Notes (Signed)
Lourdes SledgeLouise [name], patient's sister, called stating that medications were not called into pharmacy. (colchicine, doxepine, sodium bicarb, prostat). Verified that [facility] in [place] is correct pharmacy.  Dr. Ronalee BeltsBhandari notified via text page.

## 2017-04-20 NOTE — Progress Notes (Signed)
Patient's pharmacy was called for the prescriptions.

## 2017-04-29 ENCOUNTER — Inpatient Hospital Stay (HOSPITAL_COMMUNITY)
Admission: EM | Admit: 2017-04-29 | Discharge: 2017-05-14 | DRG: 602 | Disposition: A | Discharge status: Hospice | Payer: Medicare Other | Attending: Pulmonary Disease | Admitting: Pulmonary Disease

## 2017-04-29 ENCOUNTER — Emergency Department (HOSPITAL_COMMUNITY): Payer: Medicare Other

## 2017-04-29 ENCOUNTER — Encounter (HOSPITAL_COMMUNITY): Payer: Self-pay | Admitting: Emergency Medicine

## 2017-04-29 DIAGNOSIS — Z79899 Other long term (current) drug therapy: Secondary | ICD-10-CM

## 2017-04-29 DIAGNOSIS — E6609 Other obesity due to excess calories: Secondary | ICD-10-CM | POA: Diagnosis not present

## 2017-04-29 DIAGNOSIS — D638 Anemia in other chronic diseases classified elsewhere: Secondary | ICD-10-CM | POA: Diagnosis present

## 2017-04-29 DIAGNOSIS — E1169 Type 2 diabetes mellitus with other specified complication: Secondary | ICD-10-CM | POA: Diagnosis present

## 2017-04-29 DIAGNOSIS — L03311 Cellulitis of abdominal wall: Principal | ICD-10-CM | POA: Diagnosis present

## 2017-04-29 DIAGNOSIS — E876 Hypokalemia: Secondary | ICD-10-CM | POA: Diagnosis not present

## 2017-04-29 DIAGNOSIS — L03818 Cellulitis of other sites: Secondary | ICD-10-CM | POA: Diagnosis not present

## 2017-04-29 DIAGNOSIS — G4730 Sleep apnea, unspecified: Secondary | ICD-10-CM | POA: Diagnosis present

## 2017-04-29 DIAGNOSIS — R609 Edema, unspecified: Secondary | ICD-10-CM

## 2017-04-29 DIAGNOSIS — K219 Gastro-esophageal reflux disease without esophagitis: Secondary | ICD-10-CM | POA: Diagnosis present

## 2017-04-29 DIAGNOSIS — N183 Chronic kidney disease, stage 3 (moderate): Secondary | ICD-10-CM | POA: Diagnosis present

## 2017-04-29 DIAGNOSIS — Z7901 Long term (current) use of anticoagulants: Secondary | ICD-10-CM

## 2017-04-29 DIAGNOSIS — I130 Hypertensive heart and chronic kidney disease with heart failure and stage 1 through stage 4 chronic kidney disease, or unspecified chronic kidney disease: Secondary | ICD-10-CM | POA: Diagnosis present

## 2017-04-29 DIAGNOSIS — E039 Hypothyroidism, unspecified: Secondary | ICD-10-CM | POA: Diagnosis present

## 2017-04-29 DIAGNOSIS — Z888 Allergy status to other drugs, medicaments and biological substances status: Secondary | ICD-10-CM

## 2017-04-29 DIAGNOSIS — E669 Obesity, unspecified: Secondary | ICD-10-CM | POA: Diagnosis present

## 2017-04-29 DIAGNOSIS — N179 Acute kidney failure, unspecified: Secondary | ICD-10-CM | POA: Diagnosis present

## 2017-04-29 DIAGNOSIS — R627 Adult failure to thrive: Secondary | ICD-10-CM | POA: Diagnosis present

## 2017-04-29 DIAGNOSIS — R197 Diarrhea, unspecified: Secondary | ICD-10-CM | POA: Diagnosis not present

## 2017-04-29 DIAGNOSIS — L0390 Cellulitis, unspecified: Secondary | ICD-10-CM | POA: Diagnosis present

## 2017-04-29 DIAGNOSIS — J449 Chronic obstructive pulmonary disease, unspecified: Secondary | ICD-10-CM | POA: Diagnosis not present

## 2017-04-29 DIAGNOSIS — M109 Gout, unspecified: Secondary | ICD-10-CM | POA: Diagnosis present

## 2017-04-29 DIAGNOSIS — Z6837 Body mass index (BMI) 37.0-37.9, adult: Secondary | ICD-10-CM | POA: Diagnosis not present

## 2017-04-29 DIAGNOSIS — K760 Fatty (change of) liver, not elsewhere classified: Secondary | ICD-10-CM | POA: Diagnosis present

## 2017-04-29 DIAGNOSIS — E43 Unspecified severe protein-calorie malnutrition: Secondary | ICD-10-CM | POA: Diagnosis present

## 2017-04-29 DIAGNOSIS — Z881 Allergy status to other antibiotic agents status: Secondary | ICD-10-CM

## 2017-04-29 DIAGNOSIS — E8809 Other disorders of plasma-protein metabolism, not elsewhere classified: Secondary | ICD-10-CM

## 2017-04-29 DIAGNOSIS — Z87891 Personal history of nicotine dependence: Secondary | ICD-10-CM

## 2017-04-29 DIAGNOSIS — R110 Nausea: Secondary | ICD-10-CM | POA: Diagnosis not present

## 2017-04-29 DIAGNOSIS — L03114 Cellulitis of left upper limb: Secondary | ICD-10-CM | POA: Diagnosis present

## 2017-04-29 DIAGNOSIS — F329 Major depressive disorder, single episode, unspecified: Secondary | ICD-10-CM | POA: Diagnosis present

## 2017-04-29 DIAGNOSIS — I5032 Chronic diastolic (congestive) heart failure: Secondary | ICD-10-CM | POA: Diagnosis present

## 2017-04-29 DIAGNOSIS — E1122 Type 2 diabetes mellitus with diabetic chronic kidney disease: Secondary | ICD-10-CM | POA: Diagnosis present

## 2017-04-29 DIAGNOSIS — I480 Paroxysmal atrial fibrillation: Secondary | ICD-10-CM | POA: Diagnosis present

## 2017-04-29 DIAGNOSIS — I5033 Acute on chronic diastolic (congestive) heart failure: Secondary | ICD-10-CM | POA: Diagnosis present

## 2017-04-29 DIAGNOSIS — I10 Essential (primary) hypertension: Secondary | ICD-10-CM | POA: Diagnosis present

## 2017-04-29 DIAGNOSIS — L03116 Cellulitis of left lower limb: Secondary | ICD-10-CM | POA: Diagnosis present

## 2017-04-29 DIAGNOSIS — E871 Hypo-osmolality and hyponatremia: Secondary | ICD-10-CM | POA: Diagnosis not present

## 2017-04-29 DIAGNOSIS — J9690 Respiratory failure, unspecified, unspecified whether with hypoxia or hypercapnia: Secondary | ICD-10-CM

## 2017-04-29 DIAGNOSIS — K2270 Barrett's esophagus without dysplasia: Secondary | ICD-10-CM | POA: Diagnosis present

## 2017-04-29 DIAGNOSIS — L03319 Cellulitis of trunk, unspecified: Secondary | ICD-10-CM

## 2017-04-29 DIAGNOSIS — F419 Anxiety disorder, unspecified: Secondary | ICD-10-CM | POA: Diagnosis present

## 2017-04-29 DIAGNOSIS — Z515 Encounter for palliative care: Secondary | ICD-10-CM | POA: Diagnosis not present

## 2017-04-29 DIAGNOSIS — Z794 Long term (current) use of insulin: Secondary | ICD-10-CM

## 2017-04-29 DIAGNOSIS — R7989 Other specified abnormal findings of blood chemistry: Secondary | ICD-10-CM | POA: Diagnosis not present

## 2017-04-29 DIAGNOSIS — I878 Other specified disorders of veins: Secondary | ICD-10-CM | POA: Diagnosis present

## 2017-04-29 DIAGNOSIS — L03115 Cellulitis of right lower limb: Secondary | ICD-10-CM | POA: Diagnosis present

## 2017-04-29 DIAGNOSIS — Z882 Allergy status to sulfonamides status: Secondary | ICD-10-CM

## 2017-04-29 LAB — COMPREHENSIVE METABOLIC PANEL
ALBUMIN: 2.1 g/dL — AB (ref 3.5–5.0)
ALK PHOS: 205 U/L — AB (ref 38–126)
ALT: 48 U/L (ref 14–54)
AST: 81 U/L — AB (ref 15–41)
Anion gap: 7 (ref 5–15)
BUN: 5 mg/dL — AB (ref 6–20)
CALCIUM: 8 mg/dL — AB (ref 8.9–10.3)
CHLORIDE: 102 mmol/L (ref 101–111)
CO2: 29 mmol/L (ref 22–32)
CREATININE: 0.86 mg/dL (ref 0.44–1.00)
GFR calc non Af Amer: >60 mL/min (ref >60)
GLUCOSE: 92 mg/dL (ref 65–99)
Potassium: 4.4 mmol/L (ref 3.5–5.1)
SODIUM: 138 mmol/L (ref 135–145)
Total Bilirubin: 1.4 mg/dL — ABNORMAL HIGH (ref 0.3–1.2)
Total Protein: 5 g/dL — ABNORMAL LOW (ref 6.5–8.1)

## 2017-04-29 LAB — CBC
HCT: 28.1 % — ABNORMAL LOW (ref 36.0–46.0)
Hemoglobin: 9 g/dL — ABNORMAL LOW (ref 12.0–15.0)
MCH: 32.1 pg (ref 26.0–34.0)
MCHC: 32 g/dL (ref 30.0–36.0)
MCV: 100.4 fL — AB (ref 78.0–100.0)
PLATELETS: 226 10*3/uL (ref 150–400)
RBC: 2.8 MIL/uL — ABNORMAL LOW (ref 3.87–5.11)
RDW: 17.8 % — ABNORMAL HIGH (ref 11.5–15.5)
WBC: 11.1 10*3/uL — ABNORMAL HIGH (ref 4.0–10.5)

## 2017-04-29 LAB — URINALYSIS, ROUTINE W REFLEX MICROSCOPIC
Bilirubin Urine: NEGATIVE
GLUCOSE, UA: NEGATIVE mg/dL
Ketones, ur: NEGATIVE mg/dL
Nitrite: NEGATIVE
PH: 6 (ref 5.0–8.0)
Protein, ur: NEGATIVE mg/dL
SPECIFIC GRAVITY, URINE: 1.006 (ref 1.005–1.030)

## 2017-04-29 LAB — GLUCOSE, CAPILLARY: Glucose-Capillary: 82 mg/dL (ref 65–99)

## 2017-04-29 LAB — LIPASE, BLOOD: LIPASE: 16 U/L (ref 11–51)

## 2017-04-29 MED ORDER — DOXYCYCLINE HYCLATE 100 MG IV SOLR
100.0000 mg | Freq: Once | INTRAVENOUS | Status: AC
  Administered 2017-04-29: 100 mg via INTRAVENOUS
  Filled 2017-04-29: qty 100

## 2017-04-29 MED ORDER — ACETAMINOPHEN 325 MG PO TABS
650.0000 mg | ORAL_TABLET | Freq: Four times a day (QID) | ORAL | Status: DC | PRN
  Administered 2017-05-10 – 2017-05-12 (×2): 650 mg via ORAL

## 2017-04-29 MED ORDER — GABAPENTIN 100 MG PO CAPS
200.0000 mg | ORAL_CAPSULE | Freq: Every day | ORAL | Status: DC
  Administered 2017-04-30 – 2017-05-13 (×13): 200 mg via ORAL
  Filled 2017-04-29 (×16): qty 2

## 2017-04-29 MED ORDER — DOXYCYCLINE HYCLATE 100 MG IV SOLR
INTRAVENOUS | Status: AC
  Filled 2017-04-29: qty 100

## 2017-04-29 MED ORDER — ESCITALOPRAM OXALATE 10 MG PO TABS
20.0000 mg | ORAL_TABLET | Freq: Every day | ORAL | Status: DC
  Administered 2017-04-30 – 2017-05-13 (×14): 20 mg via ORAL
  Filled 2017-04-29 (×14): qty 2

## 2017-04-29 MED ORDER — TRIAMCINOLONE ACETONIDE 55 MCG/ACT NA AERO
2.0000 | INHALATION_SPRAY | Freq: Every day | NASAL | Status: DC
  Filled 2017-04-29: qty 21.6

## 2017-04-29 MED ORDER — SODIUM BICARBONATE 650 MG PO TABS
650.0000 mg | ORAL_TABLET | Freq: Two times a day (BID) | ORAL | Status: DC
  Administered 2017-04-30 – 2017-05-13 (×28): 650 mg via ORAL
  Filled 2017-04-29 (×29): qty 1

## 2017-04-29 MED ORDER — INSULIN ASPART 100 UNIT/ML ~~LOC~~ SOLN
0.0000 [IU] | Freq: Three times a day (TID) | SUBCUTANEOUS | Status: DC
Start: 2017-04-30 — End: 2017-05-14

## 2017-04-29 MED ORDER — HYPROMELLOSE (GONIOSCOPIC) 2.5 % OP SOLN
1.0000 [drp] | Freq: Every day | OPHTHALMIC | Status: DC | PRN
  Filled 2017-04-29: qty 15

## 2017-04-29 MED ORDER — SODIUM CHLORIDE 0.9% FLUSH: 3.0000 mL | INTRAVENOUS | Status: DC | PRN

## 2017-04-29 MED ORDER — FLUTICASONE FUROATE-VILANTEROL 200-25 MCG/INH IN AEPB
1.0000 | INHALATION_SPRAY | Freq: Every day | RESPIRATORY_TRACT | Status: DC
  Administered 2017-04-30 – 2017-05-14 (×15): 1 via RESPIRATORY_TRACT
  Filled 2017-04-29 (×2): qty 28

## 2017-04-29 MED ORDER — COLCHICINE 0.6 MG PO TABS
0.6000 mg | ORAL_TABLET | Freq: Every day | ORAL | Status: DC
  Administered 2017-04-30 – 2017-05-05 (×6): 0.6 mg via ORAL
  Filled 2017-04-29 (×6): qty 1

## 2017-04-29 MED ORDER — POTASSIUM CHLORIDE CRYS ER 20 MEQ PO TBCR
20.0000 meq | EXTENDED_RELEASE_TABLET | Freq: Every day | ORAL | Status: DC
  Administered 2017-04-30 – 2017-05-05 (×6): 20 meq via ORAL
  Filled 2017-04-29 (×6): qty 1

## 2017-04-29 MED ORDER — SODIUM CHLORIDE 0.9 % IV SOLN: 250.0000 mL | INTRAVENOUS | Status: DC | PRN

## 2017-04-29 MED ORDER — ALBUTEROL SULFATE (2.5 MG/3ML) 0.083% IN NEBU: 3.0000 mL | INHALATION_SOLUTION | Freq: Four times a day (QID) | RESPIRATORY_TRACT | Status: DC

## 2017-04-29 MED ORDER — LEVOTHYROXINE SODIUM 112 MCG PO TABS
137.0000 ug | ORAL_TABLET | Freq: Every day | ORAL | Status: DC
  Administered 2017-04-30 – 2017-05-13 (×14): 137 ug via ORAL
  Filled 2017-04-29 (×14): qty 1

## 2017-04-29 MED ORDER — DILTIAZEM HCL ER COATED BEADS 120 MG PO CP24
120.0000 mg | ORAL_CAPSULE | Freq: Every day | ORAL | Status: DC
  Administered 2017-04-30 – 2017-05-13 (×13): 120 mg via ORAL
  Filled 2017-04-29 (×14): qty 1

## 2017-04-29 MED ORDER — VANCOMYCIN 50 MG/ML ORAL SOLUTION
125.0000 mg | Freq: Two times a day (BID) | ORAL | Status: DC
  Administered 2017-04-30 – 2017-05-01 (×5): 125 mg via ORAL
  Filled 2017-04-29 (×8): qty 2.5

## 2017-04-29 MED ORDER — SODIUM CHLORIDE 0.9% FLUSH
3.0000 mL | Freq: Two times a day (BID) | INTRAVENOUS | Status: DC
  Administered 2017-04-30 – 2017-05-13 (×26): 3 mL via INTRAVENOUS

## 2017-04-29 MED ORDER — FUROSEMIDE 40 MG PO TABS
40.0000 mg | ORAL_TABLET | Freq: Two times a day (BID) | ORAL | Status: DC
  Administered 2017-04-30: 40 mg via ORAL
  Filled 2017-04-29: qty 1

## 2017-04-29 MED ORDER — IOPAMIDOL (ISOVUE-300) INJECTION 61%
100.0000 mL | Freq: Once | INTRAVENOUS | Status: AC | PRN
  Administered 2017-04-29: 100 mL via INTRAVENOUS

## 2017-04-29 MED ORDER — METOPROLOL TARTRATE 25 MG PO TABS
12.5000 mg | ORAL_TABLET | Freq: Two times a day (BID) | ORAL | Status: DC
  Administered 2017-04-30 – 2017-05-13 (×20): 12.5 mg via ORAL
  Filled 2017-04-29 (×26): qty 1

## 2017-04-29 MED ORDER — ADULT MULTIVITAMIN W/MINERALS CH
1.0000 | ORAL_TABLET | Freq: Two times a day (BID) | ORAL | Status: DC
  Administered 2017-04-30 – 2017-05-13 (×28): 1 via ORAL
  Filled 2017-04-29 (×29): qty 1

## 2017-04-29 MED ORDER — APIXABAN 5 MG PO TABS
5.0000 mg | ORAL_TABLET | Freq: Two times a day (BID) | ORAL | Status: DC
  Administered 2017-04-30 – 2017-05-13 (×28): 5 mg via ORAL
  Filled 2017-04-29 (×29): qty 1

## 2017-04-29 MED ORDER — VITAMIN D 1000 UNITS PO TABS
1000.0000 [IU] | ORAL_TABLET | Freq: Every morning | ORAL | Status: DC
  Administered 2017-04-30 – 2017-05-13 (×14): 1000 [IU] via ORAL
  Filled 2017-04-29 (×14): qty 1

## 2017-04-29 MED ORDER — DOXYCYCLINE HYCLATE 100 MG IV SOLR
100.0000 mg | Freq: Two times a day (BID) | INTRAVENOUS | Status: AC
  Administered 2017-04-30 – 2017-05-05 (×12): 100 mg via INTRAVENOUS
  Filled 2017-04-29 (×15): qty 100

## 2017-04-29 MED ORDER — ALLOPURINOL 300 MG PO TABS
150.0000 mg | ORAL_TABLET | Freq: Every day | ORAL | Status: DC
  Administered 2017-04-30 – 2017-05-05 (×6): 150 mg via ORAL
  Administered 2017-05-06: 300 mg via ORAL
  Administered 2017-05-07 – 2017-05-13 (×7): 150 mg via ORAL
  Filled 2017-04-29 (×14): qty 1

## 2017-04-29 MED ORDER — CALCIUM CARBONATE ANTACID 500 MG PO CHEW
1.0000 | CHEWABLE_TABLET | Freq: Two times a day (BID) | ORAL | Status: DC
  Administered 2017-04-30 – 2017-05-13 (×28): 200 mg via ORAL
  Filled 2017-04-29 (×28): qty 1

## 2017-04-29 MED ORDER — ACETAMINOPHEN 325 MG PO TABS
650.0000 mg | ORAL_TABLET | Freq: Four times a day (QID) | ORAL | Status: DC | PRN
  Filled 2017-04-29 (×2): qty 2

## 2017-04-29 MED ORDER — ACETAMINOPHEN 650 MG RE SUPP: 650.0000 mg | Freq: Four times a day (QID) | RECTAL | Status: DC | PRN

## 2017-04-29 NOTE — H&P (Signed)
History and Physical    Yolanda [name]:[id number] DOB: 1940/11/05 DOA: 04/29/2017  PCP: Kari Baars, MD  Patient coming from:  home  Chief Complaint:  Right flank pain and redness  HPI: Yolanda [name] is a 76 y.o. female with medical history significant of fatty liver, obesity, recurrent cellulitis, recent cdiff on oral vanc, diastolic chf, htn comes in with complaints of 3 days of right flank pain that is worsening and hot to touch. Denies any fevers or chills at home.  Area is red.  No trauma to area.  Area has been more swollen than usual.  No n/v/d.  She is on freq abx and has many allergies.  Pt found to have cellulitis to her flank and referred for admission for treatment with iv abx.     Review of Systems: As per HPI otherwise 10 point review of systems negative.   Past Medical History:  Diagnosis Date  . Barrett esophagus   . Bronchitis   . C. difficile colitis   . Cellulitis   . Cellulitis   . CHF (congestive heart failure) (HCC)   . COPD (chronic obstructive pulmonary disease) (HCC)   . Diabetes mellitus without complication (HCC)   . Diastolic heart failure (HCC)   . Difficulty walking   . Dysphagia   . Essential hypertension   . Gastritis   . GERD (gastroesophageal reflux disease)   . Gout   . Hepatomegaly    ???  . History of falling   . Hyponatremia   . Hypothyroidism   . Iron deficiency anemia   . Muscle weakness   . PAF (paroxysmal atrial fibrillation) (HCC)    Eliquis  . Sleep apnea   . Stage III chronic kidney disease     Past Surgical History:  Procedure Laterality Date  . BREAST BIOPSY Right Sept, 2012  . CHOLECYSTECTOMY    . COLONOSCOPY    . ESOPHAGEAL DILATION N/A 02/05/2017   Procedure: ESOPHAGEAL DILATION;  Surgeon: Malissa Hippo, MD;  Location: [facility];  Service: Endoscopy;  Laterality: N/A;  . ESOPHAGOGASTRODUODENOSCOPY N/A 02/05/2017   Procedure: ESOPHAGOGASTRODUODENOSCOPY (EGD);  Surgeon: Malissa Hippo, MD;  Location: [facility]  ENDO SUITE;  Service: Endoscopy;  Laterality: N/A;  . UPPER GASTROINTESTINAL ENDOSCOPY       reports that she quit smoking about 17 years ago. Her smoking use included Cigarettes. She started smoking about 58 years ago. She has a 15.00 pack-year smoking history. She has never used smokeless tobacco. She reports that she does not drink alcohol or use drugs.  Allergies  Allergen Reactions  . Ciprofloxacin Shortness Of Breath    REACTION: sob,tachycardia  . Doxycycline Nausea Only    Also experienced diarrhea   . Fish Oil     Patient face drew to the side,Bells Palsey  . Guaifenesin Shortness Of Breath    REACTION: sob,tachycardia  . Mucinex [Guaifenesin Er] Shortness Of Breath  . Sulfamethoxazole W/Trimethoprim 800-160 [Sulfamethoxazole-Trimethoprim] Nausea Only    Also lack of appetite   . Uloric [Febuxostat] Swelling    No urination  . Adhesive [Tape] Other (See Comments)    Causes blisters on skin  . Cefuroxime Axetil Swelling    Swelling all over body-per patient she was hospitalized as a result of taking this medication  . Metolazone Nausea Only    Swelling   . Tramadol     insomnia    Family History  Problem Relation Age of Onset  . Dementia Mother   .  Lung cancer Father        smoked  . Hypertension Sister   . Diabetes Brother   . Obesity Sister   . Hypertension Sister   . Restless legs syndrome Sister   . Healthy Sister   . Lung cancer Maternal Uncle     Prior to Admission medications   Medication Sig Start Date End Date Taking? Authorizing Provider  acetaminophen (TYLENOL) 325 MG tablet Take 2 tablets (650 mg total) by mouth every 6 (six) hours as needed for mild pain (or Fever >/= 101). 01/07/17  Yes Kari Baars, MD  albuterol (PROVENTIL HFA;VENTOLIN HFA) 108 (90 Base) MCG/ACT inhaler Inhale 2 puffs into the lungs 4 (four) times daily.    Yes [provider]  apixaban (ELIQUIS) 5 MG TABS tablet Take 1 tablet (5 mg total) by mouth 2 (two) times  daily. 11/13/14  Yes Kari Baars, MD  calcium carbonate (TUMS - DOSED IN MG ELEMENTAL CALCIUM) 500 MG chewable tablet Chew 1 tablet by mouth 2 (two) times daily.   Yes [provider]  carboxymethylcellulose (REFRESH PLUS) 0.5 % SOLN Apply 1 drop to eye daily as needed (for dry eyes).   Yes [provider]  cetirizine (ZYRTEC) 10 MG tablet Take 10 mg by mouth daily.   Yes [provider]  cholecalciferol (VITAMIN D) 1000 units tablet Take 1,000 Units by mouth every morning.   Yes [provider]  colchicine 0.6 MG tablet Take 1 tablet (0.6 mg total) by mouth daily. 04/19/17  Yes Maxie Barb, MD  diltiazem (CARDIZEM CD) 120 MG 24 hr capsule Take 1 capsule (120 mg total) by mouth daily. 01/26/16  Yes Laqueta Linden, MD  escitalopram (LEXAPRO) 20 MG tablet Take 1 tablet (20 mg total) by mouth daily. 03/20/17  Yes Kari Baars, MD  fluticasone furoate-vilanterol (BREO ELLIPTA) 200-25 MCG/INH AEPB Inhale 1 puff into the lungs daily.   Yes [provider]  furosemide (LASIX) 20 MG tablet Take 1 tablet (20 mg total) by mouth daily. 04/20/17  Yes Maxie Barb, MD  gabapentin (NEURONTIN) 100 MG capsule Take 2 capsules (200 mg total) by mouth at bedtime. 04/19/17  Yes Maxie Barb, MD  levothyroxine (SYNTHROID, LEVOTHROID) 137 MCG tablet Take 137 mcg by mouth daily before breakfast.   Yes [provider]  loperamide (IMODIUM) 2 MG capsule Take 1 capsule (2 mg total) by mouth every morning. Patient taking differently: Take 2 mg by mouth every morning. *May take up to 4 times daily as needed for loose stools and diarrhea 04/02/17  Yes Rehman, Joline Maxcy, MD  metoprolol tartrate (LOPRESSOR) 25 MG tablet TAKE 1/2 TABLET BY MOUTH TWICE DAILY 10/23/16  Yes Laqueta Linden, MD  omeprazole (PRILOSEC) 20 MG capsule TAKE ONE CAPSULE BY MOUTH DAILY 05/29/16  Yes Rehman, Joline Maxcy, MD  Pediatric Multivitamins-Iron (FLINTSTONES PLUS IRON)  chewable tablet Chew 1 tablet by mouth 2 (two) times daily. 04/02/17  Yes Rehman, Joline Maxcy, MD  potassium chloride SA (K-DUR,KLOR-CON) 20 MEQ tablet Take 1 tablet (20 mEq total) by mouth daily. 04/19/17  Yes Maxie Barb, MD  sodium bicarbonate 650 MG tablet Take 1 tablet (650 mg total) by mouth 2 (two) times daily. 04/19/17  Yes Maxie Barb, MD  triamcinolone (NASACORT ALLERGY 24HR) 55 MCG/ACT AERO nasal inhaler Place 2 sprays into the nose daily.   Yes [provider]  vancomycin (VANCOCIN) 50 mg/mL oral solution Take 2.5 mLs (125 mg total) by mouth 2 (two)  times daily. 04/19/17  Yes Maxie Barb, MD  allopurinol (ZYLOPRIM) 100 MG tablet Take 150 mg by mouth daily. 04/20/17   [provider]  Amino Acids-Protein Hydrolys (FEEDING SUPPLEMENT, PRO-STAT SUGAR FREE 64,) LIQD Take 30 mLs by mouth 2 (two) times daily. Patient not taking: Reported on 04/29/2017 04/16/17   Alison Murray, MD  Doxepin HCl (SILENOR) 6 MG TABS Take 1 tablet (6 mg total) by mouth at bedtime as needed (for sleep). Patient not taking: Reported on 04/29/2017 04/19/17   Maxie Barb, MD  insulin glargine (LANTUS) 100 UNIT/ML injection Inject 0.2 mLs (20 Units total) into the skin daily. Patient not taking: Reported on 04/29/2017 01/07/17   Kari Baars, MD  ondansetron (ZOFRAN-ODT) 4 MG disintegrating tablet Take 1 tablet (4 mg total) by mouth 3 (three) times daily before meals. Patient not taking: Reported on 04/29/2017 03/20/17   Kari Baars, MD    Physical Exam: Vitals:   04/29/17 1746 04/29/17 2000 04/29/17 2219 04/29/17 2317  BP:  (!) 104/50 (!) 116/56 (!) 124/53  Pulse:  79 82 78  Resp:  17 16 18   Temp:    98.2 F (36.8 C)  TempSrc:    Oral  SpO2: 93% 98% 96% 99%  Weight:      Height:          Constitutional: NAD, calm, comfortable, obese Vitals:   04/29/17 1746 04/29/17 2000 04/29/17 2219 04/29/17 2317  BP:  (!) 104/50 (!) 116/56 (!) 124/53  Pulse:  79 82 78    Resp:  17 16 18   Temp:    98.2 F (36.8 C)  TempSrc:    Oral  SpO2: 93% 98% 96% 99%  Weight:      Height:       Eyes: PERRL, lids and conjunctivae normal ENMT: Mucous membranes are moist. Posterior pharynx clear of any exudate or lesions.Normal dentition.  Neck: normal, supple, no masses, no thyromegaly Respiratory: clear to auscultation bilaterally, no wheezing, no crackles. Normal respiratory effort. No accessory muscle use.  Cardiovascular: Regular rate and rhythm, no murmurs / rubs / gallops. No extremity edema. 2+ pedal pulses. No carotid bruits.  Abdomen: no tenderness, no masses palpated. No hepatosplenomegaly. Bowel sounds positive.  Musculoskeletal: no clubbing / cyanosis. No joint deformity upper and lower extremities. Good ROM, no contractures. Normal muscle tone.  Skin: erythema to left flank with associated edema, no absess palpated, no discharge anywhere Neurologic: CN 2-12 grossly intact. Sensation intact, DTR normal. Strength 5/5 in all 4.  Psychiatric: Normal judgment and insight. Alert and oriented x 3. Normal mood.    Labs on Admission: I have personally reviewed following labs and imaging studies  CBC:  Recent Labs Lab 04/29/17 1946  WBC 11.1*  HGB 9.0*  HCT 28.1*  MCV 100.4*  PLT 226   Basic Metabolic Panel:  Recent Labs Lab 04/29/17 1946  NA 138  K 4.4  CL 102  CO2 29  GLUCOSE 92  BUN 5*  CREATININE 0.86  CALCIUM 8.0*   GFR: Estimated Creatinine Clearance: 57.3 mL/min (by C-G formula based on SCr of 0.86 mg/dL). Liver Function Tests:  Recent Labs Lab 04/29/17 1946  AST 81*  ALT 48  ALKPHOS 205*  BILITOT 1.4*  PROT 5.0*  ALBUMIN 2.1*    Recent Labs Lab 04/29/17 1946  LIPASE 16   CBG:  Recent Labs Lab 04/29/17 2319  GLUCAP 82    Urine analysis:    Component Value Date/Time   COLORURINE YELLOW 04/29/2017  1745   APPEARANCEUR CLOUDY (A) 04/29/2017 1745   LABSPEC 1.006 04/29/2017 1745   PHURINE 6.0 04/29/2017 1745    GLUCOSEU NEGATIVE 04/29/2017 1745   HGBUR SMALL (A) 04/29/2017 1745   BILIRUBINUR NEGATIVE 04/29/2017 1745   KETONESUR NEGATIVE 04/29/2017 1745   PROTEINUR NEGATIVE 04/29/2017 1745   NITRITE NEGATIVE 04/29/2017 1745   LEUKOCYTESUR LARGE (A) 04/29/2017 1745    Recent Results (from the past 240 hour(s))  Culture, blood (Routine X 2) w Reflex to ID Panel     Status: None (Preliminary result)   Collection Time: 04/29/17  8:18 PM  Result Value Ref Range Status   Specimen Description BLOOD RIGHT ARM  Final   Special Requests   Final    BOTTLES DRAWN AEROBIC AND ANAEROBIC Blood Culture adequate volume   Culture PENDING  Incomplete   Report Status PENDING  Incomplete  Culture, blood (Routine X 2) w Reflex to ID Panel     Status: None (Preliminary result)   Collection Time: 04/29/17  8:26 PM  Result Value Ref Range Status   Specimen Description BLOOD RIGHT ARM  Final   Special Requests   Final    BOTTLES DRAWN AEROBIC AND ANAEROBIC Blood Culture adequate volume   Culture PENDING  Incomplete   Report Status PENDING  Incomplete     Radiological Exams on Admission: Ct Abdomen Pelvis W Contrast  Result Date: 04/29/2017 CLINICAL DATA:  Erythema, pain and swelling of right lower abdomen. EXAM: CT ABDOMEN AND PELVIS WITH CONTRAST TECHNIQUE: Multidetector CT imaging of the abdomen and pelvis was performed using the standard protocol following bolus administration of intravenous contrast. CONTRAST:  ISOVUE-300 IOPAMIDOL (ISOVUE-300) INJECTION 61% COMPARISON:  01/28/2017 FINDINGS: Lower chest: New small bilateral pleural effusions with associated bibasilar atelectasis. Hepatobiliary: Marked progressive steatosis of the liver with likely progression to more overt cirrhosis. No focal hepatic mass lesions or evidence of biliary ductal dilatation. No evidence of portal vein thrombus. The gallbladder has been removed. Pancreas: Stable atrophic pancreas without evidence of lesion or pancreatitis. Spleen:  Spleen size is normal. Splenic parenchyma shows normal enhancement. The splenic vein is open. Adrenals/Urinary Tract: Adrenal glands are unremarkable. Kidneys are normal, without renal calculi, focal lesion, or hydronephrosis. Bladder is unremarkable. Stomach/Bowel: No evidence of bowel obstruction or free air. Stable submucosal fat infiltration in the cecum and ascending colon can be associated with chronic inflammation. No acute inflammatory changes are seen. No evidence of abscess. Vascular/Lymphatic: No enlarged lymph nodes are seen. The abdominal aorta shows calcified plaque without evidence of aneurysm. Reproductive: Status post hysterectomy. No adnexal masses. Other: There is a small amount of ascites in the pelvis. The abdominal wall shows diffuse edema and anasarca suggestive of malnutrition and hypoproteinemia. No hernias identified. Musculoskeletal: No acute or significant osseous findings. IMPRESSION: 1. Marked progressive steatosis of the liver with progression to more overt cirrhosis. Associated small amount of ascites in the pelvis. There also are small bilateral pleural effusions. 2. Stable submucosal fat infiltration of the cecum and ascending colon. This can be associated with chronic inflammatory conditions. No acute inflammation or obstruction identified. 3. Diffuse body wall edema and anasarca is suggestive of malnutrition and hypoproteinemia. Electronically Signed   By: Irish Lack M.D.   On: 04/29/2017 21:44    Assessment/Plan 76 yo female with left flank cellulitis  Principal Problem:   Cellulitis- pt with many allergies.  Place on doxycycline 100mg  iv q 12 hr.  And monitor clinical improvement.    Active Problems:   HTN (  hypertension)- cont home meds   COPD (chronic obstructive pulmonary disease) (HCC)- stable   PAF (paroxysmal atrial fibrillation) (HCC)- stable   Chronic anticoagulation- stable, cont home meds   Chronic edema- increasing her lasix from 20mg  po to 40mg  po  bid for the next couple of days, this will need to be decreased in the next day or so   Obesity- noted   Diabetes mellitus type 2 in obese [facility](HCC)- SSI   Chronic diastolic CHF (congestive heart failure) (HCC)- noted   Cirrhosis ???- pt reports no h/o of cirrhosis but says she has "liver disease" "its fatty"   Recent cdiff - cont oral vanc.  Contact precautions.  DVT prophylaxis: eloquis Code Status:  full Family Communication: none  Disposition Plan:  Per day team Consults called:  none Admission status:  admission   Faiza Bansal A MD [facility]  If 7PM-7AM, please contact night-coverage [email or web address] Password [email or web address]  04/29/2017, 11:32 PM

## 2017-04-29 NOTE — ED Provider Notes (Signed)
[facility]PT Provider Note   CSN: [id number] Arrival date & time: 04/29/17  1734     History   Chief Complaint Chief Complaint  Patient presents with  . Abdominal Pain    HPI Yolanda [name] is a 76 y.o. female.  HPI [place] of painful reddened area at right flank which started 3 or 4 days ago. No known fever. She did have one episode of diarrhea today. Other associated symptoms include diminished appetite. No vomiting or nausea. No other associated symptoms. No treatment prior to coming here Past Medical History:  Diagnosis Date  . Barrett esophagus   . Bronchitis   . C. difficile colitis   . Cellulitis   . Cellulitis   . CHF (congestive heart failure) (HCC)   . COPD (chronic obstructive pulmonary disease) (HCC)   . Diabetes mellitus without complication (HCC)   . Diastolic heart failure (HCC)   . Difficulty walking   . Dysphagia   . Essential hypertension   . Gastritis   . GERD (gastroesophageal reflux disease)   . Gout   . Hepatomegaly    ???  . History of falling   . Hyponatremia   . Hypothyroidism   . Iron deficiency anemia   . Muscle weakness   . PAF (paroxysmal atrial fibrillation) (HCC)    Eliquis  . Sleep apnea   . Stage III chronic kidney disease     Patient Active Problem List   Diagnosis Date Noted  . Fluid overload   . Delirium 04/05/2017  . UTI (urinary tract infection) 04/05/2017  . Diarrhea 03/12/2017  . Schatzki's ring 02/06/2017  . Gastritis 02/06/2017  . Esophageal dysphagia   . Elevated LFTs 01/28/2017  . Diabetes mellitus type 2 in obese (HCC) 01/28/2017  . Metabolic encephalopathy 01/01/2017  . Acute encephalopathy 12/30/2016  . Acute lower UTI 12/30/2016  . Pain, dental 12/30/2016  . Spasm of back muscles 12/30/2016  . Hyperglycemia 12/30/2016  . Gout attack 09/07/2016  . Chronic anticoagulation 02/02/2016  . Chronic edema 02/02/2016  . Obesity 02/02/2016  . Sleep apnea 02/02/2016  . RBBB 02/02/2016  . Acute on  chronic diastolic heart failure (HCC) 01/13/2016  . Hyponatremia 01/06/2016  . AKI (acute kidney injury) (HCC) 01/06/2016  . Cellulitis of left lower extremity 12/15/2014  . PAF (paroxysmal atrial fibrillation) (HCC) 11/09/2014  . COPD (chronic obstructive pulmonary disease) (HCC) 11/06/2014  . Neuropathic pain 11/06/2014  . Hypothyroidism 11/06/2014  . Hyperkalemia 11/06/2014  . HTN (hypertension) 01/12/2014  . Upper airway cough syndrome 09/03/2013  . GERD (gastroesophageal reflux disease) 11/13/2011  . Short-segment Barrett's esophagus 11/13/2011    Past Surgical History:  Procedure Laterality Date  . BREAST BIOPSY Right Sept, 2012  . CHOLECYSTECTOMY    . COLONOSCOPY    . ESOPHAGEAL DILATION N/A 02/05/2017   Procedure: ESOPHAGEAL DILATION;  Surgeon: Malissa Hippo, MD;  Location: [facility];  Service: Endoscopy;  Laterality: N/A;  . ESOPHAGOGASTRODUODENOSCOPY N/A 02/05/2017   Procedure: ESOPHAGOGASTRODUODENOSCOPY (EGD);  Surgeon: Malissa Hippo, MD;  Location: [facility];  Service: Endoscopy;  Laterality: N/A;  . UPPER GASTROINTESTINAL ENDOSCOPY      OB History    Gravida Para Term Preterm AB Living             0   SAB TAB Ectopic Multiple Live Births                   Home Medications    Prior to Admission medications  Medication Sig Start Date End Date Taking? Authorizing Provider  acetaminophen (TYLENOL) 325 MG tablet Take 2 tablets (650 mg total) by mouth every 6 (six) hours as needed for mild pain (or Fever >/= 101). 01/07/17  Yes Kari Baars, MD  albuterol (PROVENTIL HFA;VENTOLIN HFA) 108 (90 Base) MCG/ACT inhaler Inhale 2 puffs into the lungs 4 (four) times daily.    Yes [provider]  apixaban (ELIQUIS) 5 MG TABS tablet Take 1 tablet (5 mg total) by mouth 2 (two) times daily. 11/13/14  Yes Kari Baars, MD  calcium carbonate (TUMS - DOSED IN MG ELEMENTAL CALCIUM) 500 MG chewable tablet Chew 1 tablet by mouth 2 (two) times daily.   Yes  [provider]  carboxymethylcellulose (REFRESH PLUS) 0.5 % SOLN Apply 1 drop to eye daily as needed (for dry eyes).   Yes [provider]  cetirizine (ZYRTEC) 10 MG tablet Take 10 mg by mouth daily.   Yes [provider]  cholecalciferol (VITAMIN D) 1000 units tablet Take 1,000 Units by mouth every morning.   Yes [provider]  colchicine 0.6 MG tablet Take 1 tablet (0.6 mg total) by mouth daily. 04/19/17  Yes Maxie Barb, MD  diltiazem (CARDIZEM CD) 120 MG 24 hr capsule Take 1 capsule (120 mg total) by mouth daily. 01/26/16  Yes Laqueta Linden, MD  escitalopram (LEXAPRO) 20 MG tablet Take 1 tablet (20 mg total) by mouth daily. 03/20/17  Yes Kari Baars, MD  fluticasone furoate-vilanterol (BREO ELLIPTA) 200-25 MCG/INH AEPB Inhale 1 puff into the lungs daily.   Yes [provider]  furosemide (LASIX) 20 MG tablet Take 1 tablet (20 mg total) by mouth daily. 04/20/17  Yes Maxie Barb, MD  gabapentin (NEURONTIN) 100 MG capsule Take 2 capsules (200 mg total) by mouth at bedtime. 04/19/17  Yes Maxie Barb, MD  levothyroxine (SYNTHROID, LEVOTHROID) 137 MCG tablet Take 137 mcg by mouth daily before breakfast.   Yes [provider]  loperamide (IMODIUM) 2 MG capsule Take 1 capsule (2 mg total) by mouth every morning. Patient taking differently: Take 2 mg by mouth every morning. *May take up to 4 times daily as needed for loose stools and diarrhea 04/02/17  Yes Rehman, Joline Maxcy, MD  metoprolol tartrate (LOPRESSOR) 25 MG tablet TAKE 1/2 TABLET BY MOUTH TWICE DAILY 10/23/16  Yes Laqueta Linden, MD  omeprazole (PRILOSEC) 20 MG capsule TAKE ONE CAPSULE BY MOUTH DAILY 05/29/16  Yes Rehman, Joline Maxcy, MD  Pediatric Multivitamins-Iron (FLINTSTONES PLUS IRON) chewable tablet Chew 1 tablet by mouth 2 (two) times daily. 04/02/17  Yes Rehman, Joline Maxcy, MD  potassium chloride SA (K-DUR,KLOR-CON) 20 MEQ tablet Take 1 tablet (20 mEq  total) by mouth daily. 04/19/17  Yes Maxie Barb, MD  sodium bicarbonate 650 MG tablet Take 1 tablet (650 mg total) by mouth 2 (two) times daily. 04/19/17  Yes Maxie Barb, MD  triamcinolone (NASACORT ALLERGY 24HR) 55 MCG/ACT AERO nasal inhaler Place 2 sprays into the nose daily.   Yes [provider]  vancomycin (VANCOCIN) 50 mg/mL oral solution Take 2.5 mLs (125 mg total) by mouth 2 (two) times daily. 04/19/17  Yes Maxie Barb, MD  allopurinol (ZYLOPRIM) 100 MG tablet Take 150 mg by mouth daily. 04/20/17   [provider]  Amino Acids-Protein Hydrolys (FEEDING SUPPLEMENT, PRO-STAT SUGAR FREE 64,) LIQD Take 30 mLs by mouth 2 (two) times daily. Patient not taking: Reported on 04/29/2017 04/16/17   Alison Murray,  MD  Doxepin HCl (SILENOR) 6 MG TABS Take 1 tablet (6 mg total) by mouth at bedtime as needed (for sleep). Patient not taking: Reported on 04/29/2017 04/19/17   Maxie BarbBhandari, Dron Prasad, MD  insulin glargine (LANTUS) 100 UNIT/ML injection Inject 0.2 mLs (20 Units total) into the skin daily. Patient not taking: Reported on 04/29/2017 01/07/17   Kari BaarsHawkins, Edward, MD  ondansetron (ZOFRAN-ODT) 4 MG disintegrating tablet Take 1 tablet (4 mg total) by mouth 3 (three) times daily before meals. Patient not taking: Reported on 04/29/2017 03/20/17   Kari BaarsHawkins, Edward, MD   Vancomycin Family History Family History  Problem Relation Age of Onset  . Dementia Mother   . Lung cancer Father        smoked  . Hypertension Sister   . Diabetes Brother   . Obesity Sister   . Hypertension Sister   . Restless legs syndrome Sister   . Healthy Sister   . Lung cancer Maternal Uncle     Social History Social History  Substance Use Topics  . Smoking status: Former Smoker    Packs/day: 0.50    Years: 30.00    Types: Cigarettes    Start date: 09/24/1958    Quit date: 11/13/1999  . Smokeless tobacco: Never Used  . Alcohol use No     Allergies   Ciprofloxacin; Doxycycline;  Fish oil; Guaifenesin; Mucinex [guaifenesin er]; Sulfamethoxazole w/trimethoprim 800-160 [sulfamethoxazole-trimethoprim]; Uloric [febuxostat]; Adhesive [tape]; Cefuroxime axetil; Metolazone; and Tramadol   Review of Systems Review of Systems  Constitutional: Negative.   HENT: Negative.   Respiratory: Negative.   Cardiovascular: Negative.   Gastrointestinal: Positive for diarrhea.       One episode of diarrhea today  Genitourinary: Positive for flank pain.  Skin: Negative.   Allergic/Immunologic: Positive for immunocompromised state.       Diabetic  Neurological: Negative.   Psychiatric/Behavioral: Negative.   All other systems reviewed and are negative.    Physical Exam Updated Vital Signs BP (!) 115/49   Pulse 83   Temp 98 F (36.7 C)   Resp (!) 22   Ht 5\' 1"  (1.549 m)   Wt 86.2 kg (190 lb)   SpO2 93%   BMI 35.90 kg/m   Physical Exam  Constitutional:  Chronically ill-appearing  HENT:  Head: Normocephalic and atraumatic.  Eyes: Pupils are equal, round, and reactive to light. Conjunctivae are normal.  Neck: Neck supple. No tracheal deviation present. No thyromegaly present.  Cardiovascular: Normal rate and regular rhythm.   No murmur heard. Pulmonary/Chest: Effort normal and breath sounds normal.  Abdominal: Soft. Bowel sounds are normal. She exhibits no distension. There is no tenderness.  Morbidly obese  Genitourinary:  Genitourinary Comments: Right flank is reddened, minimally tender  Musculoskeletal: Normal range of motion. She exhibits no edema or tenderness.  Neurological: She is alert. Coordination normal.  Skin: Skin is warm and dry. No rash noted.  Psychiatric: She has a normal mood and affect.  Nursing note and vitals reviewed.    ED Treatments / Results  Labs (all labs ordered are listed, but only abnormal results are displayed) Labs Reviewed  CULTURE, BLOOD (ROUTINE X 2)  CULTURE, BLOOD (ROUTINE X 2)  LIPASE, BLOOD  COMPREHENSIVE METABOLIC  PANEL  CBC  URINALYSIS, ROUTINE W REFLEX MICROSCOPIC    EKG  EKG Interpretation None       Radiology No results found.  Procedures Procedures (including critical care time)  Medications Ordered in ED Medications - No data to display Results for  orders placed or performed during the hospital encounter of 04/29/17  Culture, blood (Routine X 2) w Reflex to ID Panel  Result Value Ref Range   Specimen Description BLOOD RIGHT ARM    Special Requests      BOTTLES DRAWN AEROBIC AND ANAEROBIC Blood Culture adequate volume   Culture PENDING    Report Status PENDING   Culture, blood (Routine X 2) w Reflex to ID Panel  Result Value Ref Range   Specimen Description BLOOD RIGHT ARM    Special Requests      BOTTLES DRAWN AEROBIC AND ANAEROBIC Blood Culture adequate volume   Culture PENDING    Report Status PENDING   Lipase, blood  Result Value Ref Range   Lipase 16 11 - 51 U/L  Comprehensive metabolic panel  Result Value Ref Range   Sodium 138 135 - 145 mmol/L   Potassium 4.4 3.5 - 5.1 mmol/L   Chloride 102 101 - 111 mmol/L   CO2 29 22 - 32 mmol/L   Glucose, Bld 92 65 - 99 mg/dL   BUN 5 (L) 6 - 20 mg/dL   Creatinine, Ser 1.61 0.44 - 1.00 mg/dL   Calcium 8.0 (L) 8.9 - 10.3 mg/dL   Total Protein 5.0 (L) 6.5 - 8.1 g/dL   Albumin 2.1 (L) 3.5 - 5.0 g/dL   AST 81 (H) 15 - 41 U/L   ALT 48 14 - 54 U/L   Alkaline Phosphatase 205 (H) 38 - 126 U/L   Total Bilirubin 1.4 (H) 0.3 - 1.2 mg/dL   GFR calc non Af Amer >60 >60 mL/min   GFR calc Af Amer >60 >60 mL/min   Anion gap 7 5 - 15  CBC  Result Value Ref Range   WBC 11.1 (H) 4.0 - 10.5 K/uL   RBC 2.80 (L) 3.87 - 5.11 MIL/uL   Hemoglobin 9.0 (L) 12.0 - 15.0 g/dL   HCT 09.6 (L) 04.5 - 40.9 %   MCV 100.4 (H) 78.0 - 100.0 fL   MCH 32.1 26.0 - 34.0 pg   MCHC 32.0 30.0 - 36.0 g/dL   RDW 81.1 (H) 91.4 - 78.2 %   Platelets 226 150 - 400 K/uL  Urinalysis, Routine w reflex microscopic  Result Value Ref Range   Color, Urine YELLOW  YELLOW   APPearance CLOUDY (A) CLEAR   Specific Gravity, Urine 1.006 1.005 - 1.030   pH 6.0 5.0 - 8.0   Glucose, UA NEGATIVE NEGATIVE mg/dL   Hgb urine dipstick SMALL (A) NEGATIVE   Bilirubin Urine NEGATIVE NEGATIVE   Ketones, ur NEGATIVE NEGATIVE mg/dL   Protein, ur NEGATIVE NEGATIVE mg/dL   Nitrite NEGATIVE NEGATIVE   Leukocytes, UA LARGE (A) NEGATIVE   RBC / HPF 0-5 0 - 5 RBC/hpf   WBC, UA TOO NUMEROUS TO COUNT 0 - 5 WBC/hpf   Bacteria, UA RARE (A) NONE SEEN   Squamous Epithelial / LPF 0-5 (A) NONE SEEN   WBC Clumps PRESENT    Mucous PRESENT    Budding Yeast PRESENT    Ct Head Wo Contrast  Result Date: 04/05/2017 CLINICAL DATA:  Altered mental status with hypoxia EXAM: CT HEAD WITHOUT CONTRAST TECHNIQUE: Contiguous axial images were obtained from the base of the skull through the vertex without intravenous contrast. COMPARISON:  01/28/2017 FINDINGS: Brain: No acute territorial infarction, hemorrhage or intracranial mass is seen. Atrophy and mild small vessel ischemic changes of the white matter. Tiny lacunar infarcts, old in the bilateral basal ganglia. Nonenlarged ventricles. Vascular:  No hyperdense vessels.  Carotid artery calcifications. Skull: Normal. Negative for fracture or focal lesion. Sinuses/Orbits: No acute finding. Other: None IMPRESSION: No definite CT evidence for acute intracranial abnormality. Electronically Signed   By: Jasmine Pang M.D.   On: 04/05/2017 20:53   Ct Abdomen Pelvis W Contrast  Result Date: 04/29/2017 CLINICAL DATA:  Erythema, pain and swelling of right lower abdomen. EXAM: CT ABDOMEN AND PELVIS WITH CONTRAST TECHNIQUE: Multidetector CT imaging of the abdomen and pelvis was performed using the standard protocol following bolus administration of intravenous contrast. CONTRAST:  ISOVUE-300 IOPAMIDOL (ISOVUE-300) INJECTION 61% COMPARISON:  01/28/2017 FINDINGS: Lower chest: New small bilateral pleural effusions with associated bibasilar atelectasis.  Hepatobiliary: Marked progressive steatosis of the liver with likely progression to more overt cirrhosis. No focal hepatic mass lesions or evidence of biliary ductal dilatation. No evidence of portal vein thrombus. The gallbladder has been removed. Pancreas: Stable atrophic pancreas without evidence of lesion or pancreatitis. Spleen: Spleen size is normal. Splenic parenchyma shows normal enhancement. The splenic vein is open. Adrenals/Urinary Tract: Adrenal glands are unremarkable. Kidneys are normal, without renal calculi, focal lesion, or hydronephrosis. Bladder is unremarkable. Stomach/Bowel: No evidence of bowel obstruction or free air. Stable submucosal fat infiltration in the cecum and ascending colon can be associated with chronic inflammation. No acute inflammatory changes are seen. No evidence of abscess. Vascular/Lymphatic: No enlarged lymph nodes are seen. The abdominal aorta shows calcified plaque without evidence of aneurysm. Reproductive: Status post hysterectomy. No adnexal masses. Other: There is a small amount of ascites in the pelvis. The abdominal wall shows diffuse edema and anasarca suggestive of malnutrition and hypoproteinemia. No hernias identified. Musculoskeletal: No acute or significant osseous findings. IMPRESSION: 1. Marked progressive steatosis of the liver with progression to more overt cirrhosis. Associated small amount of ascites in the pelvis. There also are small bilateral pleural effusions. 2. Stable submucosal fat infiltration of the cecum and ascending colon. This can be associated with chronic inflammatory conditions. No acute inflammation or obstruction identified. 3. Diffuse body wall edema and anasarca is suggestive of malnutrition and hypoproteinemia. Electronically Signed   By: Irish Lack M.D.   On: 04/29/2017 21:44   Dg Chest Port 1 View  Result Date: 04/08/2017 CLINICAL DATA:  76 year old female with confusion and shortness of breath. EXAM: PORTABLE CHEST 1 VIEW  COMPARISON:  Chest radiograph dated 04/05/2017 FINDINGS: There is cardiomegaly with probable mild vascular congestion. Bibasilar hazy densities, right greater left, have increased compared to prior study. Although this may represent progression of atelectasis, superimposed pneumonia is not excluded. Clinical correlation is recommended. Small bilateral pleural effusions may be present. There is no pneumothorax. No acute osseous pathology. IMPRESSION: 1. Cardiomegaly with mild vascular congestion. 2. Bibasilar densities, progressed compared to the prior radiograph. Pneumonia is not excluded. Clinical correlation is recommended. Electronically Signed   By: Elgie Collard M.D.   On: 04/08/2017 04:20   Dg Abd Acute W/chest  Result Date: 04/05/2017 CLINICAL DATA:  Altered mental status, being treated for C. difficile infection with vancomycin for 5 days, history CHF, hypertension, diabetes mellitus, COPD EXAM: DG ABDOMEN ACUTE W/ 1V CHEST COMPARISON:  02/10/2017 FINDINGS: Borderline enlargement of cardiac silhouette. Mediastinal contours and pulmonary vascularity normal. Atherosclerotic calcification aortic arch. Linear subsegmental atelectasis at medial LEFT lower lobe. Lungs otherwise clear. No pleural effusion or pneumothorax. Air-filled transverse colon, nondistended. Surgical clips RIGHT upper quadrant likely cholecystectomy. No bowel dilatation, bowel wall thickening, or free air. Bones diffusely demineralized. No definite urinary tract calcification. IMPRESSION:  Linear subsegmental atelectasis at medial LEFT lower lobe. No acute abdominal findings. Electronically Signed   By: Ulyses Southward M.D.   On: 04/05/2017 21:00    Initial Impression / Assessment and Plan / ED Course  I have reviewed the triage vital signs and the nursing notes.  Pertinent labs & imaging results that were available during my care of the patient were reviewed by me and considered in my medical decision making (see chart for  details).     After consultation with the hospital pharmacist it was decided to start patient on doxycycline IV. Her only reaction to doxycycline is nausea. She didn't have an antiemetic as needed.! Consulted Dr.David who will see patient in hospital and arrange for overnight stay Clinically patient has cellulitis of right flank. Patient has anemia however it is chronic Final Clinical Impressions(s) / ED Diagnoses  Diagnosis#1 cellulitis of right flank #2 anemia Final diagnoses:  None    New Prescriptions New Prescriptions   No medications on file     Doug Sou, MD 04/29/17 2234

## 2017-04-29 NOTE — ED Triage Notes (Signed)
Pt c/o redness, swelling and pain to right lower abdomen.

## 2017-04-30 LAB — BASIC METABOLIC PANEL
ANION GAP: 8 (ref 5–15)
BUN: 5 mg/dL — ABNORMAL LOW (ref 6–20)
CALCIUM: 8 mg/dL — AB (ref 8.9–10.3)
CHLORIDE: 101 mmol/L (ref 101–111)
CO2: 29 mmol/L (ref 22–32)
Creatinine, Ser: 0.86 mg/dL (ref 0.44–1.00)
GFR calc non Af Amer: >60 mL/min (ref >60)
Glucose, Bld: 86 mg/dL (ref 65–99)
Potassium: 4.1 mmol/L (ref 3.5–5.1)
Sodium: 138 mmol/L (ref 135–145)

## 2017-04-30 LAB — C DIFFICILE QUICK SCREEN W PCR REFLEX
C DIFFICILE (CDIFF) INTERP: NOT DETECTED
C Diff antigen: NEGATIVE
C Diff toxin: NEGATIVE

## 2017-04-30 LAB — GLUCOSE, CAPILLARY
GLUCOSE-CAPILLARY: 79 mg/dL (ref 65–99)
GLUCOSE-CAPILLARY: 97 mg/dL (ref 65–99)
GLUCOSE-CAPILLARY: 81 mg/dL (ref 65–99)
Glucose-Capillary: 81 mg/dL (ref 65–99)

## 2017-04-30 LAB — CBC
HCT: 28.9 % — ABNORMAL LOW (ref 36.0–46.0)
HEMOGLOBIN: 9.3 g/dL — AB (ref 12.0–15.0)
MCH: 32.4 pg (ref 26.0–34.0)
MCHC: 32.2 g/dL (ref 30.0–36.0)
MCV: 100.7 fL — AB (ref 78.0–100.0)
Platelets: 217 10*3/uL (ref 150–400)
RBC: 2.87 MIL/uL — AB (ref 3.87–5.11)
RDW: 18 % — ABNORMAL HIGH (ref 11.5–15.5)
WBC: 9.9 10*3/uL (ref 4.0–10.5)

## 2017-04-30 LAB — MRSA PCR SCREENING: MRSA BY PCR: NEGATIVE

## 2017-04-30 MED ORDER — VANCOMYCIN 50 MG/ML ORAL SOLUTION
ORAL | Status: AC
  Filled 2017-04-30: qty 5

## 2017-04-30 MED ORDER — POLYVINYL ALCOHOL 1.4 % OP SOLN: 1.0000 [drp] | OPHTHALMIC | Status: DC | PRN

## 2017-04-30 MED ORDER — TORSEMIDE 20 MG PO TABS
20.0000 mg | ORAL_TABLET | Freq: Two times a day (BID) | ORAL | Status: DC
  Administered 2017-04-30 – 2017-05-01 (×2): 20 mg via ORAL
  Filled 2017-04-30 (×2): qty 1

## 2017-04-30 MED ORDER — ONDANSETRON HCL 4 MG/2ML IJ SOLN
4.0000 mg | Freq: Four times a day (QID) | INTRAMUSCULAR | Status: DC | PRN
  Administered 2017-05-03 – 2017-05-08 (×4): 4 mg via INTRAVENOUS
  Filled 2017-04-30 (×4): qty 2

## 2017-04-30 MED ORDER — BOOST / RESOURCE BREEZE PO LIQD: 1.0000 | Freq: Three times a day (TID) | ORAL | Status: DC

## 2017-04-30 MED ORDER — ALBUTEROL SULFATE (2.5 MG/3ML) 0.083% IN NEBU
3.0000 mL | INHALATION_SOLUTION | Freq: Four times a day (QID) | RESPIRATORY_TRACT | Status: DC
  Administered 2017-04-30 – 2017-05-01 (×5): 3 mL via RESPIRATORY_TRACT
  Filled 2017-04-30 (×5): qty 3

## 2017-04-30 NOTE — Care Management (Signed)
    Durable Medical Equipment        Start     Ordered   04/30/17 1527  For home use only DME Walker rolling  Once    Comments:  May need junior walker due to ht of 5'1"  Question:  Patient needs a walker to treat with the following condition  Answer:  General weakness   04/30/17 1528

## 2017-04-30 NOTE — Progress Notes (Signed)
Initial Nutrition Assessment    INTERVENTION:  Boost Breeze po TID, each supplement provides 250 kcal and 9 grams of protein   ProStat 30 ml TID (each 30 ml provides 100 kcal, 15 gr protein)   NUTRITION DIAGNOSIS:    Inadequate oral intake related to poor appetite as evidenced by meal intake <50%  GOAL:  Pt to meet >/= 90% of their estimated nutrition needs      MONITOR:  Po intake, labs and wt trends     REASON FOR ASSESSMENT:   Consult COPD Protocol  ASSESSMENT:  Patient is a 76 yo with multiple comorbid conditions. Hx includes CKD-3, CHF, Dysphagia, Barrett esophagus, cellulitis (BLE), COPD and GERD. She presents with right flank pain and redness. Her appetite is not good and hasn't been is several months. She is able to feed herself and has sisters who are supportive and involved in her care.  Nutrition-Focused physical exam completed. Findings are  no fat depletion, mild muscle depletion, and mild edema.     Recent Labs Lab 04/29/17 1946 04/30/17 0627  NA 138 138  K 4.4 4.1  CL 102 101  CO2 29 29  BUN 5* 5*  CREATININE 0.86 0.86  CALCIUM 8.0* 8.0*  GLUCOSE 92 86   Labs: reviewed. Meds: TUMS, Vitmain D, Cholchicine, SSI  Diet Order:  Diet heart healthy/carb modified Room service appropriate? Yes; Fluid consistency: Thin  Skin: redness and warmth to BLE  Last BM:   unknown  Height:   Ht Readings from Last 1 Encounters:  04/29/17 5\' 1"  (1.549 m)    Weight:   Wt Readings from Last 1 Encounters:  04/30/17 197 lb 1.5 oz (89.4 kg)    Ideal Body Weight:   48 kg  BMI:  Body mass index is 37.24 kg/m.  Estimated Nutritional Needs:   Kcal:  1600-1800 (19-21 kcal/kg) (Based on Adj wt)  Protein:  69-77 gr (0.8-0.9 gr/kg) (CKD-3)  Fluid:  1.6-1.8 liters daily  EDUCATION NEEDS: none identified at this time    Royann ShiversLynn Braydan Marriott MS,RD,CSG,LDN Office: [phone] Pager: [phone]

## 2017-04-30 NOTE — Evaluation (Signed)
Physical Therapy Evaluation Patient Details Name: Yolanda [name] MRN: [id number] DOB: 1941-09-04 Today's Date: 04/30/2017   History of Present Illness  Waunita Sandstrom is a 76yo white female who comes to [facility] on 8/6 with increased burning right flank pain x3D, found to have cellulitis. PMH: CHF, PAFm DM2, PN, CKD, HTN, UTI, cdiff. Pt has had extensive admissions and STR stays in the past several months, progress limited by multiple medical problems including recurrent C-Diff. She is now at home with HHPT, 24/7 caregiver assistance, performs stand pivot transfers from bed to [facility] with RW and physical assistance.   Clinical Impression  Pt admitted with above diagnosis. Pt currently with functional limitations due to the deficits listed below (see "PT Problem List"). MMT reveals weak, symmetrical isometric strength overall, but unable to move ankles and hips throughout full ROM due to weakness. Functional mobility assessment demonstrates severe weakness, the pt now requiring max-total-assist physical assistance for transfers from chair, however these are consistent with the patients most recent mobility levels at home PTA. A baseline level of function is difficult to truly be established. At this time due to her complex course of care and frequent hospital admissions. The patient is at high risk for falls as evidence by gait speed <1.25m/s, forward reach <5", and multiple LOB demonstrated throughout session. She has a strong falls history. Pt will benefit from skilled PT intervention to increase independence and safety with basic mobility in preparation for discharge to the venue listed below.       Follow Up Recommendations Supervision/Assistance - 24 hour;Supervision for mobility/OOB;Home health PT (continue with HHPT; has adequate support and A/E )    Equipment Recommendations  None recommended by PT    Recommendations for Other Services       Precautions / Restrictions Precautions Precautions:  Fall Precaution Comments: 3-4 falls in the past 6 months Restrictions Weight Bearing Restrictions: No      Mobility  Bed Mobility               General bed mobility comments: received in chair upon arrival   Transfers Overall transfer level: Needs assistance Equipment used: 1 person hand held assist Transfers: Sit to/from Stand Sit to Stand: Max assist         General transfer comment: given heavy assistance, but patient unable to come up from chair. Reports this to be consistent with her fuctional status at home.   Ambulation/Gait                Stairs            Wheelchair Mobility    Modified Rankin (Stroke Patients Only)       Balance Overall balance assessment: History of Falls;Needs assistance                                           Pertinent Vitals/Pain Pain Assessment: 0-10    Home Living Family/patient expects to be discharged to:: Private residence Living Arrangements: Alone Available Help at Discharge: Other (Comment);Available 24 hours/day;Family;Personal care attendant Type of Home: House Home Access: Stairs to enter Entrance Stairs-Rails: None Entrance Stairs-Number of Steps: 4-5 Home Layout: One level Home Equipment: Bedside commode;Cane - single point;Transport chair;Wheelchair - [employer] - 2 wheels Additional Comments: 2 wheeled walker is too large    Prior Function Level of Independence: Needs assistance   Gait / Transfers Assistance Needed:  [facility] at [facility], patient reports that she was able to walk with a RW 50-60 feet. Since home x7d, she performs mostly transfers, no significant AMB ditances.   ADL's / Homemaking Assistance Needed: Pt is able to complete self feeding independently. Pt is able to complete UB bathing and dressing although requires assistance for LB.         Hand Dominance   Dominant Hand: Right    Extremity/Trunk Assessment        Lower Extremity Assessment Lower  Extremity Assessment: Generalized weakness       Communication   Communication: No difficulties  Cognition Arousal/Alertness: Awake/alert Behavior During Therapy: WFL for tasks assessed/performed Overall Cognitive Status: Within Functional Limits for tasks assessed                                        General Comments      Exercises     Assessment/Plan    PT Assessment Patient needs continued PT services  PT Problem List Decreased strength;Decreased range of motion;Decreased activity tolerance;Decreased balance;Decreased mobility;Obesity;Decreased skin integrity       PT Treatment Interventions DME instruction;Gait training;Functional mobility training;Therapeutic activities;Therapeutic exercise;Balance training;Neuromuscular re-education;Patient/family education    PT Goals (Current goals can be found in the Care Plan section)  Acute Rehab PT Goals Patient Stated Goal: Regain strength, return to home  PT Goal Formulation: With patient Time For Goal Achievement: 04/25/17 Potential to Achieve Goals: Good    Frequency Min 2X/week   Barriers to discharge        Co-evaluation               AM-PAC PT "6 Clicks" Daily Activity  Outcome Measure Difficulty turning over in bed (including adjusting bedclothes, sheets and blankets)?: Total Difficulty moving from lying on back to sitting on the side of the bed? : Total Difficulty sitting down on and standing up from a chair with arms (e.g., wheelchair, bedside commode, etc,.)?: Total Help needed moving to and from a bed to chair (including a wheelchair)?: Total Help needed walking in hospital room?: Total Help needed climbing 3-5 steps with a railing? : Total 6 Click Score: 6    End of Session Equipment Utilized During Treatment: Oxygen Activity Tolerance: Patient tolerated treatment well Patient left: in bed;with bed alarm set;with call bell/phone within reach Nurse Communication: Mobility  status PT Visit Diagnosis: Muscle weakness (generalized) (M62.81)    Time: [id number] PT Time Calculation (min) (ACUTE ONLY): 9 min   Charges:   PT Evaluation $PT Eval Moderate Complexity: 1 Mod     PT G Codes:        5:16 PM, 04/30/17 [name], PT, DPT Physical Therapist - [place] [phone](ASCOM)  [phone] (Office)   Buccola,Allan C 04/30/2017, 5:12 PM

## 2017-04-30 NOTE — Care Management Note (Signed)
Case Management Note  Patient Details  Name: Yolanda [name] MRN: [id number] Date of Birth: [birth year]  Subjective/Objective:                  Admitted with cellulitis. Pt is from home, lives alone and has 24/7 PD caregivers. She has HH services through [facility]. [facility] rep, Tim, aware of admission to hospital. Pt has bed, WC, BSC, and neb. She needs RW as the one she is borrowing is too large. She is on oxygen acutely. [facility] She plans to resume HH services through [facility].   Action/Plan: Bonita QuinLinda, [facility] rep, aware of referral and will obtain pt info from chart and deliver DME to pt room prior to DC. Pt will need home oxygen assessment prior to DC. CM will cont to follow for DC needs and resume HH at DC.   Expected Discharge Date:      05/03/2017            Expected Discharge Plan:  Home w Home Health Services  In-House Referral:  NA  Discharge planning Services  CM Consult  Post Acute Care Choice:  Home Health, Durable Medical Equipment, Resumption of Svcs/PTA Provider Choice offered to:  Patient  DME Arranged:  Walker rolling DME Agency:  [facility]  [facility]  [facility]  Status of Service:  In process, will continue to follow  Malcolm MetroChildress, Ryleigh Buenger Demske, RN 04/30/2017, 3:30 PM

## 2017-04-30 NOTE — Progress Notes (Signed)
Pt on Cdiff precaution per MD due to currently being treated for Cdiff at home. Precautions initiated and treatment continued as ordered by MD

## 2017-04-30 NOTE — Progress Notes (Signed)
Subjective: She was admitted last night with cellulitis of the abdominal wall. She has multiple other medical problems and has been in and out of the hospital for the last 4 months or so. She has been in and out of skilled care facilities as well. She is coming from home this time. She is known to have COPD, congestive heart failure, atrial fib which is paroxysmal, C. difficile colitis, chronic renal failure, malnutrition, anxiety, depression fatty infiltration of the liver which on her latest CT last night looks like it has progressed to full blown cirrhosis deconditioning. She says she feels okay. Her breathing is doing okay. She's not having any chest pain. She has pain and tenderness on the right side of her abdomen  Objective: Vital signs in last 24 hours: Temp:  [97.7 F (36.5 C)-98.2 F (36.8 C)] 97.7 F (36.5 C) (08/07 0540) Pulse Rate:  [77-83] 77 (08/07 0540) Resp:  [16-22] 18 (08/07 0540) BP: (104-124)/(49-56) 122/49 (08/07 0540) SpO2:  [86 %-99 %] 97 % (08/07 0540) Weight:  [86.2 kg (190 lb)-89.4 kg (197 lb 1.5 oz)] 89.4 kg (197 lb 1.5 oz) (08/07 0540) Weight change:  Last BM Date: 04/29/17  Intake/Output from previous day: 08/06 0701 - 08/07 0700 In: 3 [I.V.:3] Out: 50 [Urine:50]  PHYSICAL EXAM General appearance: alert, cooperative and mild distress Resp: clear to auscultation bilaterally Cardio: regular rate and rhythm, S1, S2 normal, no murmur, click, rub or gallop GI: She has tenderness on the right side of her abdomen with some erythema. This area is warm. She also has what looks like some edema of the abdominal wall fairly diffusely Extremities: venous stasis dermatitis noted Skin warm and dry  Lab Results:  Results for orders placed or performed during the hospital encounter of 04/29/17 (from the past 48 hour(s))  Urinalysis, Routine w reflex microscopic     Status: Abnormal   Collection Time: 04/29/17  5:45 PM  Result Value Ref Range   Color, Urine YELLOW  YELLOW   APPearance CLOUDY (A) CLEAR   Specific Gravity, Urine 1.006 1.005 - 1.030   pH 6.0 5.0 - 8.0   Glucose, UA NEGATIVE NEGATIVE mg/dL   Hgb urine dipstick SMALL (A) NEGATIVE   Bilirubin Urine NEGATIVE NEGATIVE   Ketones, ur NEGATIVE NEGATIVE mg/dL   Protein, ur NEGATIVE NEGATIVE mg/dL   Nitrite NEGATIVE NEGATIVE   Leukocytes, UA LARGE (A) NEGATIVE   RBC / HPF 0-5 0 - 5 RBC/hpf   WBC, UA TOO NUMEROUS TO COUNT 0 - 5 WBC/hpf   Bacteria, UA RARE (A) NONE SEEN   Squamous Epithelial / LPF 0-5 (A) NONE SEEN   WBC Clumps PRESENT    Mucous PRESENT    Budding Yeast PRESENT   Lipase, blood     Status: None   Collection Time: 04/29/17  7:46 PM  Result Value Ref Range   Lipase 16 11 - 51 U/L  Comprehensive metabolic panel     Status: Abnormal   Collection Time: 04/29/17  7:46 PM  Result Value Ref Range   Sodium 138 135 - 145 mmol/L   Potassium 4.4 3.5 - 5.1 mmol/L   Chloride 102 101 - 111 mmol/L   CO2 29 22 - 32 mmol/L   Glucose, Bld 92 65 - 99 mg/dL   BUN 5 (L) 6 - 20 mg/dL   Creatinine, Ser 0.86 0.44 - 1.00 mg/dL   Calcium 8.0 (L) 8.9 - 10.3 mg/dL   Total Protein 5.0 (L) 6.5 - 8.1 g/dL  Albumin 2.1 (L) 3.5 - 5.0 g/dL   AST 81 (H) 15 - 41 U/L   ALT 48 14 - 54 U/L   Alkaline Phosphatase 205 (H) 38 - 126 U/L   Total Bilirubin 1.4 (H) 0.3 - 1.2 mg/dL   GFR calc non Af Amer >60 >60 mL/min   GFR calc Af Amer >60 >60 mL/min    Comment: (NOTE) The eGFR has been calculated using the CKD EPI equation. This calculation has not been validated in all clinical situations. eGFR's persistently <60 mL/min signify possible Chronic Kidney Disease.    Anion gap 7 5 - 15  CBC     Status: Abnormal   Collection Time: 04/29/17  7:46 PM  Result Value Ref Range   WBC 11.1 (H) 4.0 - 10.5 K/uL   RBC 2.80 (L) 3.87 - 5.11 MIL/uL   Hemoglobin 9.0 (L) 12.0 - 15.0 g/dL   HCT 28.1 (L) 36.0 - 46.0 %   MCV 100.4 (H) 78.0 - 100.0 fL   MCH 32.1 26.0 - 34.0 pg   MCHC 32.0 30.0 - 36.0 g/dL   RDW 17.8  (H) 11.5 - 15.5 %   Platelets 226 150 - 400 K/uL  Culture, blood (Routine X 2) w Reflex to ID Panel     Status: None (Preliminary result)   Collection Time: 04/29/17  8:18 PM  Result Value Ref Range   Specimen Description BLOOD RIGHT ARM    Special Requests      BOTTLES DRAWN AEROBIC AND ANAEROBIC Blood Culture adequate volume   Culture NO GROWTH < 12 HOURS    Report Status PENDING   Culture, blood (Routine X 2) w Reflex to ID Panel     Status: None (Preliminary result)   Collection Time: 04/29/17  8:26 PM  Result Value Ref Range   Specimen Description BLOOD RIGHT ARM    Special Requests      BOTTLES DRAWN AEROBIC AND ANAEROBIC Blood Culture adequate volume   Culture NO GROWTH < 12 HOURS    Report Status PENDING   Glucose, capillary     Status: None   Collection Time: 04/29/17 11:19 PM  Result Value Ref Range   Glucose-Capillary 82 65 - 99 mg/dL  MRSA PCR Screening     Status: None   Collection Time: 04/30/17  1:00 AM  Result Value Ref Range   MRSA by PCR NEGATIVE NEGATIVE    Comment:        The GeneXpert MRSA Assay (FDA approved for NASAL specimens only), is one component of a comprehensive MRSA colonization surveillance program. It is not intended to diagnose MRSA infection nor to guide or monitor treatment for MRSA infections.   Basic metabolic panel     Status: Abnormal   Collection Time: 04/30/17  6:27 AM  Result Value Ref Range   Sodium 138 135 - 145 mmol/L   Potassium 4.1 3.5 - 5.1 mmol/L   Chloride 101 101 - 111 mmol/L   CO2 29 22 - 32 mmol/L   Glucose, Bld 86 65 - 99 mg/dL   BUN 5 (L) 6 - 20 mg/dL   Creatinine, Ser 0.86 0.44 - 1.00 mg/dL   Calcium 8.0 (L) 8.9 - 10.3 mg/dL   GFR calc non Af Amer >60 >60 mL/min   GFR calc Af Amer >60 >60 mL/min    Comment: (NOTE) The eGFR has been calculated using the CKD EPI equation. This calculation has not been validated in all clinical situations. eGFR's persistently <60 mL/min signify  possible Chronic  Kidney Disease.    Anion gap 8 5 - 15  CBC     Status: Abnormal   Collection Time: 04/30/17  6:27 AM  Result Value Ref Range   WBC 9.9 4.0 - 10.5 K/uL   RBC 2.87 (L) 3.87 - 5.11 MIL/uL   Hemoglobin 9.3 (L) 12.0 - 15.0 g/dL   HCT 28.9 (L) 36.0 - 46.0 %   MCV 100.7 (H) 78.0 - 100.0 fL   MCH 32.4 26.0 - 34.0 pg   MCHC 32.2 30.0 - 36.0 g/dL   RDW 18.0 (H) 11.5 - 15.5 %   Platelets 217 150 - 400 K/uL  Glucose, capillary     Status: None   Collection Time: 04/30/17  8:10 AM  Result Value Ref Range   Glucose-Capillary 81 65 - 99 mg/dL    ABGS No results for input(s): PHART, PO2ART, TCO2, HCO3 in the last 72 hours.  Invalid input(s): PCO2 CULTURES Recent Results (from the past 240 hour(s))  Culture, blood (Routine X 2) w Reflex to ID Panel     Status: None (Preliminary result)   Collection Time: 04/29/17  8:18 PM  Result Value Ref Range Status   Specimen Description BLOOD RIGHT ARM  Final   Special Requests   Final    BOTTLES DRAWN AEROBIC AND ANAEROBIC Blood Culture adequate volume   Culture NO GROWTH < 12 HOURS  Final   Report Status PENDING  Incomplete  Culture, blood (Routine X 2) w Reflex to ID Panel     Status: None (Preliminary result)   Collection Time: 04/29/17  8:26 PM  Result Value Ref Range Status   Specimen Description BLOOD RIGHT ARM  Final   Special Requests   Final    BOTTLES DRAWN AEROBIC AND ANAEROBIC Blood Culture adequate volume   Culture NO GROWTH < 12 HOURS  Final   Report Status PENDING  Incomplete  MRSA PCR Screening     Status: None   Collection Time: 04/30/17  1:00 AM  Result Value Ref Range Status   MRSA by PCR NEGATIVE NEGATIVE Final    Comment:        The GeneXpert MRSA Assay (FDA approved for NASAL specimens only), is one component of a comprehensive MRSA colonization surveillance program. It is not intended to diagnose MRSA infection nor to guide or monitor treatment for MRSA infections.    Studies/Results: Ct Abdomen Pelvis W  Contrast  Result Date: 04/29/2017 CLINICAL DATA:  Erythema, pain and swelling of right lower abdomen. EXAM: CT ABDOMEN AND PELVIS WITH CONTRAST TECHNIQUE: Multidetector CT imaging of the abdomen and pelvis was performed using the standard protocol following bolus administration of intravenous contrast. CONTRAST:  148m ISOVUE-300 IOPAMIDOL (ISOVUE-300) INJECTION 61% COMPARISON:  01/28/2017 FINDINGS: Lower chest: New small bilateral pleural effusions with associated bibasilar atelectasis. Hepatobiliary: Marked progressive steatosis of the liver with likely progression to more overt cirrhosis. No focal hepatic mass lesions or evidence of biliary ductal dilatation. No evidence of portal vein thrombus. The gallbladder has been removed. Pancreas: Stable atrophic pancreas without evidence of lesion or pancreatitis. Spleen: Spleen size is normal. Splenic parenchyma shows normal enhancement. The splenic vein is open. Adrenals/Urinary Tract: Adrenal glands are unremarkable. Kidneys are normal, without renal calculi, focal lesion, or hydronephrosis. Bladder is unremarkable. Stomach/Bowel: No evidence of bowel obstruction or free air. Stable submucosal fat infiltration in the cecum and ascending colon can be associated with chronic inflammation. No acute inflammatory changes are seen. No evidence of abscess. Vascular/Lymphatic: No  enlarged lymph nodes are seen. The abdominal aorta shows calcified plaque without evidence of aneurysm. Reproductive: Status post hysterectomy. No adnexal masses. Other: There is a small amount of ascites in the pelvis. The abdominal wall shows diffuse edema and anasarca suggestive of malnutrition and hypoproteinemia. No hernias identified. Musculoskeletal: No acute or significant osseous findings. IMPRESSION: 1. Marked progressive steatosis of the liver with progression to more overt cirrhosis. Associated small amount of ascites in the pelvis. There also are small bilateral pleural effusions. 2.  Stable submucosal fat infiltration of the cecum and ascending colon. This can be associated with chronic inflammatory conditions. No acute inflammation or obstruction identified. 3. Diffuse body wall edema and anasarca is suggestive of malnutrition and hypoproteinemia. Electronically Signed   By: Aletta Edouard M.D.   On: 04/29/2017 21:44    Medications:  Prior to Admission:  Prescriptions Prior to Admission  Medication Sig Dispense Refill Last Dose  . acetaminophen (TYLENOL) 325 MG tablet Take 2 tablets (650 mg total) by mouth every 6 (six) hours as needed for mild pain (or Fever >/= 101).   Past Week at Unknown time  . albuterol (PROVENTIL HFA;VENTOLIN HFA) 108 (90 Base) MCG/ACT inhaler Inhale 2 puffs into the lungs 4 (four) times daily.    04/29/2017 at Unknown time  . apixaban (ELIQUIS) 5 MG TABS tablet Take 1 tablet (5 mg total) by mouth 2 (two) times daily. 60 tablet 5 04/29/2017 at 1030-1100a  . calcium carbonate (TUMS - DOSED IN MG ELEMENTAL CALCIUM) 500 MG chewable tablet Chew 1 tablet by mouth 2 (two) times daily.   04/29/2017 at Unknown time  . carboxymethylcellulose (REFRESH PLUS) 0.5 % SOLN Apply 1 drop to eye daily as needed (for dry eyes).   Past Week at Unknown time  . cetirizine (ZYRTEC) 10 MG tablet Take 10 mg by mouth daily.   04/29/2017 at Unknown time  . cholecalciferol (VITAMIN D) 1000 units tablet Take 1,000 Units by mouth every morning.   04/29/2017 at Unknown time  . colchicine 0.6 MG tablet Take 1 tablet (0.6 mg total) by mouth daily. 30 tablet 0 04/29/2017 at Unknown time  . diltiazem (CARDIZEM CD) 120 MG 24 hr capsule Take 1 capsule (120 mg total) by mouth daily. 90 capsule 3 04/29/2017 at Unknown time  . escitalopram (LEXAPRO) 20 MG tablet Take 1 tablet (20 mg total) by mouth daily. 30 tablet 12 04/29/2017 at Unknown time  . fluticasone furoate-vilanterol (BREO ELLIPTA) 200-25 MCG/INH AEPB Inhale 1 puff into the lungs daily.   04/29/2017 at Unknown time  . furosemide (LASIX) 20 MG  tablet Take 1 tablet (20 mg total) by mouth daily. 30 tablet 0 04/29/2017 at Unknown time  . gabapentin (NEURONTIN) 100 MG capsule Take 2 capsules (200 mg total) by mouth at bedtime. 30 capsule 0 04/28/2017 at Unknown time  . levothyroxine (SYNTHROID, LEVOTHROID) 137 MCG tablet Take 137 mcg by mouth daily before breakfast.   04/29/2017 at Unknown time  . loperamide (IMODIUM) 2 MG capsule Take 1 capsule (2 mg total) by mouth every morning. (Patient taking differently: Take 2 mg by mouth every morning. *May take up to 4 times daily as needed for loose stools and diarrhea) 90 capsule 5 Past Week at Unknown time  . metoprolol tartrate (LOPRESSOR) 25 MG tablet TAKE 1/2 TABLET BY MOUTH TWICE DAILY 90 tablet 1 04/29/2017 at 1030-1100a  . omeprazole (PRILOSEC) 20 MG capsule TAKE ONE CAPSULE BY MOUTH DAILY 90 capsule 3 04/29/2017 at Unknown time  . Pediatric Multivitamins-Iron (  FLINTSTONES PLUS IRON) chewable tablet Chew 1 tablet by mouth 2 (two) times daily.   04/29/2017 at Unknown time  . potassium chloride SA (K-DUR,KLOR-CON) 20 MEQ tablet Take 1 tablet (20 mEq total) by mouth daily. 30 tablet 0 04/29/2017 at Unknown time  . sodium bicarbonate 650 MG tablet Take 1 tablet (650 mg total) by mouth 2 (two) times daily. 60 tablet 0 04/29/2017 at Unknown time  . triamcinolone (NASACORT ALLERGY 24HR) 55 MCG/ACT AERO nasal inhaler Place 2 sprays into the nose daily.   unknown  . vancomycin (VANCOCIN) 50 mg/mL oral solution Take 2.5 mLs (125 mg total) by mouth 2 (two) times daily. 70 mL 0 04/28/2017 at Unknown time  . allopurinol (ZYLOPRIM) 100 MG tablet Take 150 mg by mouth daily.  0   . Amino Acids-Protein Hydrolys (FEEDING SUPPLEMENT, PRO-STAT SUGAR FREE 64,) LIQD Take 30 mLs by mouth 2 (two) times daily. (Patient not taking: Reported on 04/29/2017) 900 mL 0 Not Taking at Unknown time  . Doxepin HCl (SILENOR) 6 MG TABS Take 1 tablet (6 mg total) by mouth at bedtime as needed (for sleep). (Patient not taking: Reported on 04/29/2017) 10  tablet 0 Not Taking at Unknown time  . insulin glargine (LANTUS) 100 UNIT/ML injection Inject 0.2 mLs (20 Units total) into the skin daily. (Patient not taking: Reported on 04/29/2017) 10 mL 11 Not Taking at Unknown time  . ondansetron (ZOFRAN-ODT) 4 MG disintegrating tablet Take 1 tablet (4 mg total) by mouth 3 (three) times daily before meals. (Patient not taking: Reported on 04/29/2017) 20 tablet 0 Not Taking at Unknown time   Scheduled: . albuterol  3 mL Inhalation QID  . allopurinol  150 mg Oral Daily  . apixaban  5 mg Oral BID  . calcium carbonate  1 tablet Oral BID  . cholecalciferol  1,000 Units Oral q morning - 10a  . colchicine  0.6 mg Oral Daily  . diltiazem  120 mg Oral Daily  . escitalopram  20 mg Oral Daily  . feeding supplement  1 Container Oral TID BM  . fluticasone furoate-vilanterol  1 puff Inhalation Daily  . furosemide  40 mg Oral BID  . gabapentin  200 mg Oral QHS  . insulin aspart  0-9 Units Subcutaneous TID WC  . levothyroxine  137 mcg Oral QAC breakfast  . metoprolol tartrate  12.5 mg Oral BID  . multivitamin with minerals  1 tablet Oral BID  . potassium chloride SA  20 mEq Oral Daily  . sodium bicarbonate  650 mg Oral BID  . sodium chloride flush  3 mL Intravenous Q12H  . triamcinolone  2 spray Nasal Daily  . vancomycin  125 mg Oral BID   Continuous: . sodium chloride    . doxycycline (VIBRAMYCIN) IV     NWG:NFAOZH chloride, acetaminophen **OR** acetaminophen, acetaminophen, ondansetron (ZOFRAN) IV, polyvinyl alcohol, sodium chloride flush  Assesment: She was admitted with cellulitis of the abdominal wall. CT looks like she now has cirrhosis of the liver. She has been being treated for C. difficile colitis. She has had a prolonged treatment because she's had recurrences. She said she had a bowel movement with diarrhea yesterday but we don't have a specimen yet. She has what looks like some anasarca likely related to nutritional status. She has chronic diastolic  heart failure and as mentioned she does have some anasarca. Typically when we treat her heart failure aggressively she develops much worse chronic renal failure. Principal Problem:   Cellulitis Active Problems:  HTN (hypertension)   COPD (chronic obstructive pulmonary disease) (HCC)   PAF (paroxysmal atrial fibrillation) (HCC)   Chronic anticoagulation   Chronic edema   Obesity   Diabetes mellitus type 2 in obese (HCC)   Chronic diastolic CHF (congestive heart failure) ([place])    Plan: Continue doxycycline. Continue diuretics. Will discuss with GI about the development of cirrhosis and about treatment of C. difficile    LOS: 1 day   Domenic Schoenberger L 04/30/2017, 8:20 AM

## 2017-04-30 NOTE — Evaluation (Signed)
Occupational Therapy Evaluation Patient Details Name: Yolanda [name] MRN: [id number] DOB: 1940/12/29 Today's Date: 04/30/2017    History of Present Illness Yolanda [name] is a 76yo white female who comes to [facility] on 8/6 with increased burning right flank pain x3D, found to have cellulitis. PMH: CHF, PAFm DM2, PN, CKD, HTN, UTI, cdiff. Pt has had extensive admissions and STR stays in the past several months, progress limited by multiple medical problems including recurrent C-Diff. She is now at home with HHPT, 24/7 caregiver assistance, performs stand pivot transfers from bed to [facility] with RW and physical assistance.    Clinical Impression    Pt in bed upon therapy arrival and agreeable to participate in OT session. Patient presents with decreased BUE strenghth and endurance requiring increased assistance to complete ADL tasks. Patient will benefit from [facility] services to increase UB strength and endurance and functional performance during ADL tasks.     Follow Up Recommendations  Home health OT    Equipment Recommendations  None recommended by OT       Precautions / Restrictions Precautions Precautions: Fall Precaution Comments: 3-4 falls in the past 6 months Restrictions Weight Bearing Restrictions: No      Mobility Bed Mobility Overal bed mobility: Needs Assistance Bed Mobility: Supine to Sit     Supine to sit: Min assist     General bed mobility comments: received in chair upon arrival   Transfers Overall transfer level: Needs assistance Equipment used: Rolling walker (2 wheeled) Transfers: Sit to/from Stand Sit to Stand: Mod assist         General transfer comment: Pt with heavy posterior lean backwards when standing requiring Verbal and physical cueing to self correct.     Balance Overall balance assessment: History of Falls;Needs assistance        ADL either performed or assessed with clinical judgement   ADL Overall ADL's : Needs assistance/impaired          Lower Body Dressing: Total assistance;Sitting/lateral leans   Toilet Transfer: Moderate assistance;RW Toilet Transfer Details (indicate cue type and reason): from bed to recliner                 Vision Baseline Vision/History: Wears glasses Patient Visual Report: No change from baseline              Pertinent Vitals/Pain Pain Assessment: 0-10     Hand Dominance Right   Extremity/Trunk Assessment Upper Extremity Assessment Upper Extremity Assessment: Generalized weakness   Lower Extremity Assessment Lower Extremity Assessment: Defer to PT evaluation       Communication Communication Communication: No difficulties   Cognition Arousal/Alertness: Awake/alert Behavior During Therapy: WFL for tasks assessed/performed Overall Cognitive Status: Within Functional Limits for tasks assessed                      Home Living Family/patient expects to be discharged to:: Private residence Living Arrangements: Alone Available Help at Discharge: Other (Comment);Available 24 hours/day;Family;Personal care attendant Type of Home: House Home Access: Stairs to enter [employer] of Steps: 4-5 Entrance Stairs-Rails: None Home Layout: One level     Bathroom Shower/Tub: Walk-in shower;Tub/shower unit   Bathroom Toilet: Standard     Home Equipment: Bedside commode;Cane - single point;Transport chair;Wheelchair - [employer] - 2 wheels   Additional Comments: 2 wheeled walker is too large      Prior Functioning/Environment Level of Independence: Needs assistance  Gait / Transfers Assistance Needed: PTA at [facility], patient reports that  she was able to walk with a RW 50-60 feet. Since home x7d, she performs mostly transfers, no significant AMB ditances.  ADL's / Homemaking Assistance Needed: Pt is able to complete self feeding independently. Pt is able to complete UB bathing and dressing although requires assistance for LB.                       OT Goals(Current goals can be found in the care plan section) Acute Rehab OT Goals Patient Stated Goal: Regain strength, return to home   OT Frequency:      AM-PAC PT "6 Clicks" Daily Activity     Outcome Measure Help from another person eating meals?: None Help from another person taking care of personal grooming?: None Help from another person toileting, which includes using toliet, bedpan, or urinal?: Total Help from another person bathing (including washing, rinsing, drying)?: Total Help from another person to put on and taking off regular upper body clothing?: Total Help from another person to put on and taking off regular lower body clothing?: Total 6 Click Score: 12   End of Session Equipment Utilized During Treatment: Gait belt;Rolling walker Nurse Communication: Mobility status;Other (comment) (Pt's external cath had been removed.)  Activity Tolerance: Patient tolerated treatment well Patient left: in chair;with call bell/phone within reach  OT Visit Diagnosis: Muscle weakness (generalized) (M62.81)                Time: [id number] OT Time Calculation (min): 25 min Charges:  OT General Charges $OT Visit: 1 Procedure OT Evaluation $OT Eval Low Complexity: 1 Procedure G-Codes: OT G-codes **NOT FOR INPATIENT CLASS** Functional Assessment Tool Used: AM-PAC 6 Clicks Daily Activity Functional Limitation: Self care Self Care Current Status (U9811(G8987): At least 60 percent but less than 80 percent impaired, limited or restricted Self Care Goal Status ([id number](G8988): At least 60 percent but less than 80 percent impaired, limited or restricted Self Care Discharge Status [phone]([id number]): At least 60 percent but less than 80 percent impaired, limited or restricted   Limmie PatriciaLaura Krystopher Kuenzel, OTR/L,CBIS  [phone]   [name], Charisse MarchLaura D 04/30/2017, 5:37 PM

## 2017-05-01 LAB — CBC WITH DIFFERENTIAL/PLATELET
BASOS ABS: 0 10*3/uL (ref 0.0–0.1)
Basophils Relative: 0 %
EOS PCT: 6 %
Eosinophils Absolute: 0.5 10*3/uL (ref 0.0–0.7)
HCT: 27.1 % — ABNORMAL LOW (ref 36.0–46.0)
HEMOGLOBIN: 8.9 g/dL — AB (ref 12.0–15.0)
LYMPHS PCT: 20 %
Lymphs Abs: 1.7 10*3/uL (ref 0.7–4.0)
MCH: 32.8 pg (ref 26.0–34.0)
MCHC: 32.8 g/dL (ref 30.0–36.0)
MCV: 100 fL (ref 78.0–100.0)
Monocytes Absolute: 1 10*3/uL (ref 0.1–1.0)
Monocytes Relative: 12 %
NEUTROS PCT: 62 %
Neutro Abs: 5.5 10*3/uL (ref 1.7–7.7)
PLATELETS: 214 10*3/uL (ref 150–400)
RBC: 2.71 MIL/uL — AB (ref 3.87–5.11)
RDW: 18.1 % — ABNORMAL HIGH (ref 11.5–15.5)
WBC: 8.8 10*3/uL (ref 4.0–10.5)

## 2017-05-01 LAB — GLUCOSE, CAPILLARY
GLUCOSE-CAPILLARY: 82 mg/dL (ref 65–99)
GLUCOSE-CAPILLARY: 121 mg/dL — AB (ref 65–99)
Glucose-Capillary: 119 mg/dL — ABNORMAL HIGH (ref 65–99)
Glucose-Capillary: 141 mg/dL — ABNORMAL HIGH (ref 65–99)

## 2017-05-01 LAB — BASIC METABOLIC PANEL
ANION GAP: 9 (ref 5–15)
BUN: 5 mg/dL — ABNORMAL LOW (ref 6–20)
CO2: 30 mmol/L (ref 22–32)
Calcium: 7.9 mg/dL — ABNORMAL LOW (ref 8.9–10.3)
Chloride: 97 mmol/L — ABNORMAL LOW (ref 101–111)
Creatinine, Ser: 0.93 mg/dL (ref 0.44–1.00)
GFR calc Af Amer: >60 mL/min (ref >60)
GFR, EST NON AFRICAN AMERICAN: 59 mL/min — AB (ref >60)
GLUCOSE: 84 mg/dL (ref 65–99)
POTASSIUM: 3.8 mmol/L (ref 3.5–5.1)
SODIUM: 136 mmol/L (ref 135–145)

## 2017-05-01 MED ORDER — FUROSEMIDE 10 MG/ML IJ SOLN
40.0000 mg | Freq: Two times a day (BID) | INTRAMUSCULAR | Status: DC
  Administered 2017-05-01 – 2017-05-05 (×9): 40 mg via INTRAVENOUS
  Filled 2017-05-01 (×9): qty 4

## 2017-05-01 MED ORDER — FLUTICASONE PROPIONATE 50 MCG/ACT NA SUSP
2.0000 | Freq: Every day | NASAL | Status: DC
  Administered 2017-05-01 – 2017-05-13 (×9): 2 via NASAL
  Filled 2017-05-01: qty 16

## 2017-05-01 MED ORDER — BOOST / RESOURCE BREEZE PO LIQD
1.0000 | Freq: Three times a day (TID) | ORAL | Status: DC
  Administered 2017-05-01 – 2017-05-12 (×13): 1 via ORAL

## 2017-05-01 MED ORDER — PRO-STAT SUGAR FREE PO LIQD
30.0000 mL | Freq: Two times a day (BID) | ORAL | Status: DC
  Administered 2017-05-01 – 2017-05-09 (×8): 30 mL via ORAL
  Filled 2017-05-01 (×16): qty 30

## 2017-05-01 MED ORDER — ALBUTEROL SULFATE (2.5 MG/3ML) 0.083% IN NEBU
3.0000 mL | INHALATION_SOLUTION | Freq: Three times a day (TID) | RESPIRATORY_TRACT | Status: DC
  Administered 2017-05-01 – 2017-05-10 (×27): 3 mL via RESPIRATORY_TRACT
  Filled 2017-05-01 (×27): qty 3

## 2017-05-01 NOTE — Clinical Social Work Note (Signed)
Clinical Social Work Assessment  Patient Details  Name: Yolanda [name] MRN: [id number] Date of Birth: 06/06/1941  Date of referral:  05/01/17               Reason for consult:  Other (Comment Required) (COPD Gold)                Permission sought to share information with:    Permission granted to share information::     Name::        Agency::     Relationship::     Contact Information:     Housing/Transportation Living arrangements for the past 2 months:  Skilled Nursing Facility, Single Family Home Source of Information:  Patient Patient Interpreter Needed:  None Criminal Activity/Legal Involvement Pertinent to Current Situation/Hospitalization:  No - Comment as needed Significant Relationships:  Siblings Lives with:  Self Do you feel safe going back to the place where you live?  Yes Need for family participation in patient care:  Yes (Comment)  Care giving concerns:  Patient has two ladies from the nursing home who stay with her around the clock and her sisters fill in when necessary.     Social Worker assessment / plan:  Patient ambulates with a walker and ADLs are assisted by private duty caregivers. Patient states that she has previously been diagnosed with anxiety and depression and that she is prescribed Lexapro. Patient states that she believes that her anxiety and depression negatively impact her COPD. She has spoken with her PCP and they are addressing the issue. Patient scored a 7 on the GAD-7 and a 8 on the PHQ-9. Patient states that she feels that her PCP is managing her depression and anxiety well and that she is not interested in additional behavioral health resources.  LCSW signing off.   Employment status:  Retired Database administratornsurance information:  Managed Medicare PT Recommendations:  Not assessed at this time Information / Referral to community resources:     Patient/Family's Response to care: Patient feels her PCP is managing her symptoms appropriately    Patient/Family's Understanding of and Emotional Response to Diagnosis, Current Treatment, and Prognosis:  Patient understands her diagnosis, treatment and prognosis.   Emotional Assessment Appearance:  Appears stated age Attitude/Demeanor/Rapport:    Affect (typically observed):  Accepting, Calm Orientation:  Oriented to Self, Oriented to Place, Oriented to  Time, Oriented to Situation Alcohol / Substance use:  Not Applicable Psych involvement (Current and /or in the community):  No (Comment)  Discharge Needs  Concerns to be addressed:   (COPD Gold) Readmission within the last 30 days:  Yes Current discharge risk:  Lives alone Barriers to Discharge:  No Barriers Identified   Annice NeedySettle, Kadajah Kjos D, LCSW 05/01/2017, 10:38 AM

## 2017-05-01 NOTE — Progress Notes (Signed)
Subjective: She was admitted with pretty severe cellulitis of her right flank area. She has had previous episodes of cellulitis of her legs. She has a complicated situation with a history of heart failure, COPD, paroxysmal atrial fib, hypertension, C. difficile, malnutrition, urinary tract infections producing severe encephalopathy and failure to thrive. She is awake and alert now. She complains of pain in her abdomen. She says her breathing is okay.  Objective: Vital signs in last 24 hours: Temp:  [97.9 F (36.6 C)-98.5 F (36.9 C)] 98.5 F (36.9 C) (08/08 0604) Pulse Rate:  [73-82] 73 (08/08 0604) Resp:  [18-20] 20 (08/08 0604) BP: (106-117)/(41-49) 106/41 (08/08 0604) SpO2:  [91 %-100 %] 98 % (08/08 0806) FiO2 (%):  [28 %] 28 % (08/07 2028) Weight change:  Last BM Date: 04/30/17  Intake/Output from previous day: 08/07 0701 - 08/08 0700 In: 860 [P.O.:360; IV Piggyback:500] Out: 1400 [Urine:1400]  PHYSICAL EXAM General appearance: alert, cooperative and mild distress Resp: clear to auscultation bilaterally Cardio: regular rate and rhythm, S1, S2 normal, no murmur, click, rub or gallop GI: She still has significant cellulitis changes in her right flank. The skin is erythematous and warm. She has some generalized edema of her abdominal wall. Extremities: venous stasis dermatitis noted She doesn't show any changes of cellulitis in her legs. Skin is warm and dry otherwise  Lab Results:  Results for orders placed or performed during the hospital encounter of 04/29/17 (from the past 48 hour(s))  Urinalysis, Routine w reflex microscopic     Status: Abnormal   Collection Time: 04/29/17  5:45 PM  Result Value Ref Range   Color, Urine YELLOW YELLOW   APPearance CLOUDY (A) CLEAR   Specific Gravity, Urine 1.006 1.005 - 1.030   pH 6.0 5.0 - 8.0   Glucose, UA NEGATIVE NEGATIVE mg/dL   Hgb urine dipstick SMALL (A) NEGATIVE   Bilirubin Urine NEGATIVE NEGATIVE   Ketones, ur NEGATIVE  NEGATIVE mg/dL   Protein, ur NEGATIVE NEGATIVE mg/dL   Nitrite NEGATIVE NEGATIVE   Leukocytes, UA LARGE (A) NEGATIVE   RBC / HPF 0-5 0 - 5 RBC/hpf   WBC, UA TOO NUMEROUS TO COUNT 0 - 5 WBC/hpf   Bacteria, UA RARE (A) NONE SEEN   Squamous Epithelial / LPF 0-5 (A) NONE SEEN   WBC Clumps PRESENT    Mucous PRESENT    Budding Yeast PRESENT   Lipase, blood     Status: None   Collection Time: 04/29/17  7:46 PM  Result Value Ref Range   Lipase 16 11 - 51 U/L  Comprehensive metabolic panel     Status: Abnormal   Collection Time: 04/29/17  7:46 PM  Result Value Ref Range   Sodium 138 135 - 145 mmol/L   Potassium 4.4 3.5 - 5.1 mmol/L   Chloride 102 101 - 111 mmol/L   CO2 29 22 - 32 mmol/L   Glucose, Bld 92 65 - 99 mg/dL   BUN 5 (L) 6 - 20 mg/dL   Creatinine, Ser 0.86 0.44 - 1.00 mg/dL   Calcium 8.0 (L) 8.9 - 10.3 mg/dL   Total Protein 5.0 (L) 6.5 - 8.1 g/dL   Albumin 2.1 (L) 3.5 - 5.0 g/dL   AST 81 (H) 15 - 41 U/L   ALT 48 14 - 54 U/L   Alkaline Phosphatase 205 (H) 38 - 126 U/L   Total Bilirubin 1.4 (H) 0.3 - 1.2 mg/dL   GFR calc non Af Amer >60 >60 mL/min   GFR  calc Af Amer >60 >60 mL/min    Comment: (NOTE) The eGFR has been calculated using the CKD EPI equation. This calculation has not been validated in all clinical situations. eGFR's persistently <60 mL/min signify possible Chronic Kidney Disease.    Anion gap 7 5 - 15  CBC     Status: Abnormal   Collection Time: 04/29/17  7:46 PM  Result Value Ref Range   WBC 11.1 (H) 4.0 - 10.5 K/uL   RBC 2.80 (L) 3.87 - 5.11 MIL/uL   Hemoglobin 9.0 (L) 12.0 - 15.0 g/dL   HCT 28.1 (L) 36.0 - 46.0 %   MCV 100.4 (H) 78.0 - 100.0 fL   MCH 32.1 26.0 - 34.0 pg   MCHC 32.0 30.0 - 36.0 g/dL   RDW 17.8 (H) 11.5 - 15.5 %   Platelets 226 150 - 400 K/uL  Culture, blood (Routine X 2) w Reflex to ID Panel     Status: None (Preliminary result)   Collection Time: 04/29/17  8:18 PM  Result Value Ref Range   Specimen Description BLOOD RIGHT ARM     Special Requests      BOTTLES DRAWN AEROBIC AND ANAEROBIC Blood Culture adequate volume   Culture NO GROWTH 2 DAYS    Report Status PENDING   Culture, blood (Routine X 2) w Reflex to ID Panel     Status: None (Preliminary result)   Collection Time: 04/29/17  8:26 PM  Result Value Ref Range   Specimen Description BLOOD RIGHT ARM    Special Requests      BOTTLES DRAWN AEROBIC AND ANAEROBIC Blood Culture adequate volume   Culture NO GROWTH 2 DAYS    Report Status PENDING   Glucose, capillary     Status: None   Collection Time: 04/29/17 11:19 PM  Result Value Ref Range   Glucose-Capillary 82 65 - 99 mg/dL  MRSA PCR Screening     Status: None   Collection Time: 04/30/17  1:00 AM  Result Value Ref Range   MRSA by PCR NEGATIVE NEGATIVE    Comment:        The GeneXpert MRSA Assay (FDA approved for NASAL specimens only), is one component of a comprehensive MRSA colonization surveillance program. It is not intended to diagnose MRSA infection nor to guide or monitor treatment for MRSA infections.   Basic metabolic panel     Status: Abnormal   Collection Time: 04/30/17  6:27 AM  Result Value Ref Range   Sodium 138 135 - 145 mmol/L   Potassium 4.1 3.5 - 5.1 mmol/L   Chloride 101 101 - 111 mmol/L   CO2 29 22 - 32 mmol/L   Glucose, Bld 86 65 - 99 mg/dL   BUN 5 (L) 6 - 20 mg/dL   Creatinine, Ser 0.86 0.44 - 1.00 mg/dL   Calcium 8.0 (L) 8.9 - 10.3 mg/dL   GFR calc non Af Amer >60 >60 mL/min   GFR calc Af Amer >60 >60 mL/min    Comment: (NOTE) The eGFR has been calculated using the CKD EPI equation. This calculation has not been validated in all clinical situations. eGFR's persistently <60 mL/min signify possible Chronic Kidney Disease.    Anion gap 8 5 - 15  CBC     Status: Abnormal   Collection Time: 04/30/17  6:27 AM  Result Value Ref Range   WBC 9.9 4.0 - 10.5 K/uL   RBC 2.87 (L) 3.87 - 5.11 MIL/uL   Hemoglobin 9.3 (L) 12.0 - 15.0  g/dL   HCT 28.9 (L) 36.0 - 46.0 %   MCV  100.7 (H) 78.0 - 100.0 fL   MCH 32.4 26.0 - 34.0 pg   MCHC 32.2 30.0 - 36.0 g/dL   RDW 18.0 (H) 11.5 - 15.5 %   Platelets 217 150 - 400 K/uL  Glucose, capillary     Status: None   Collection Time: 04/30/17  8:10 AM  Result Value Ref Range   Glucose-Capillary 81 65 - 99 mg/dL  Glucose, capillary     Status: None   Collection Time: 04/30/17 11:52 AM  Result Value Ref Range   Glucose-Capillary 79 65 - 99 mg/dL  C difficile quick scan w PCR reflex     Status: None   Collection Time: 04/30/17  4:55 PM  Result Value Ref Range   C Diff antigen NEGATIVE NEGATIVE   C Diff toxin NEGATIVE NEGATIVE   C Diff interpretation No C. difficile detected.   Glucose, capillary     Status: None   Collection Time: 04/30/17  5:14 PM  Result Value Ref Range   Glucose-Capillary 97 65 - 99 mg/dL   Comment 1 Notify RN    Comment 2 Document in Chart   Glucose, capillary     Status: None   Collection Time: 04/30/17  9:23 PM  Result Value Ref Range   Glucose-Capillary 81 65 - 99 mg/dL   Comment 1 Notify RN    Comment 2 Document in Chart   CBC with Differential/Platelet     Status: Abnormal   Collection Time: 05/01/17  7:42 AM  Result Value Ref Range   WBC 8.8 4.0 - 10.5 K/uL   RBC 2.71 (L) 3.87 - 5.11 MIL/uL   Hemoglobin 8.9 (L) 12.0 - 15.0 g/dL   HCT 27.1 (L) 36.0 - 46.0 %   MCV 100.0 78.0 - 100.0 fL   MCH 32.8 26.0 - 34.0 pg   MCHC 32.8 30.0 - 36.0 g/dL   RDW 18.1 (H) 11.5 - 15.5 %   Platelets 214 150 - 400 K/uL   Neutrophils Relative % 62 %   Neutro Abs 5.5 1.7 - 7.7 K/uL   Lymphocytes Relative 20 %   Lymphs Abs 1.7 0.7 - 4.0 K/uL   Monocytes Relative 12 %   Monocytes Absolute 1.0 0.1 - 1.0 K/uL   Eosinophils Relative 6 %   Eosinophils Absolute 0.5 0.0 - 0.7 K/uL   Basophils Relative 0 %   Basophils Absolute 0.0 0.0 - 0.1 K/uL  Glucose, capillary     Status: None   Collection Time: 05/01/17  7:59 AM  Result Value Ref Range   Glucose-Capillary 82 65 - 99 mg/dL   Comment 1 Notify RN     Comment 2 Document in Chart     ABGS No results for input(s): PHART, PO2ART, TCO2, HCO3 in the last 72 hours.  Invalid input(s): PCO2 CULTURES Recent Results (from the past 240 hour(s))  Culture, blood (Routine X 2) w Reflex to ID Panel     Status: None (Preliminary result)   Collection Time: 04/29/17  8:18 PM  Result Value Ref Range Status   Specimen Description BLOOD RIGHT ARM  Final   Special Requests   Final    BOTTLES DRAWN AEROBIC AND ANAEROBIC Blood Culture adequate volume   Culture NO GROWTH 2 DAYS  Final   Report Status PENDING  Incomplete  Culture, blood (Routine X 2) w Reflex to ID Panel     Status: None (Preliminary result)  Collection Time: 04/29/17  8:26 PM  Result Value Ref Range Status   Specimen Description BLOOD RIGHT ARM  Final   Special Requests   Final    BOTTLES DRAWN AEROBIC AND ANAEROBIC Blood Culture adequate volume   Culture NO GROWTH 2 DAYS  Final   Report Status PENDING  Incomplete  MRSA PCR Screening     Status: None   Collection Time: 04/30/17  1:00 AM  Result Value Ref Range Status   MRSA by PCR NEGATIVE NEGATIVE Final    Comment:        The GeneXpert MRSA Assay (FDA approved for NASAL specimens only), is one component of a comprehensive MRSA colonization surveillance program. It is not intended to diagnose MRSA infection nor to guide or monitor treatment for MRSA infections.   C difficile quick scan w PCR reflex     Status: None   Collection Time: 04/30/17  4:55 PM  Result Value Ref Range Status   C Diff antigen NEGATIVE NEGATIVE Final   C Diff toxin NEGATIVE NEGATIVE Final   C Diff interpretation No C. difficile detected.  Final   Studies/Results: Ct Abdomen Pelvis W Contrast  Result Date: 04/29/2017 CLINICAL DATA:  Erythema, pain and swelling of right lower abdomen. EXAM: CT ABDOMEN AND PELVIS WITH CONTRAST TECHNIQUE: Multidetector CT imaging of the abdomen and pelvis was performed using the standard protocol following bolus  administration of intravenous contrast. CONTRAST:  141m ISOVUE-300 IOPAMIDOL (ISOVUE-300) INJECTION 61% COMPARISON:  01/28/2017 FINDINGS: Lower chest: New small bilateral pleural effusions with associated bibasilar atelectasis. Hepatobiliary: Marked progressive steatosis of the liver with likely progression to more overt cirrhosis. No focal hepatic mass lesions or evidence of biliary ductal dilatation. No evidence of portal vein thrombus. The gallbladder has been removed. Pancreas: Stable atrophic pancreas without evidence of lesion or pancreatitis. Spleen: Spleen size is normal. Splenic parenchyma shows normal enhancement. The splenic vein is open. Adrenals/Urinary Tract: Adrenal glands are unremarkable. Kidneys are normal, without renal calculi, focal lesion, or hydronephrosis. Bladder is unremarkable. Stomach/Bowel: No evidence of bowel obstruction or free air. Stable submucosal fat infiltration in the cecum and ascending colon can be associated with chronic inflammation. No acute inflammatory changes are seen. No evidence of abscess. Vascular/Lymphatic: No enlarged lymph nodes are seen. The abdominal aorta shows calcified plaque without evidence of aneurysm. Reproductive: Status post hysterectomy. No adnexal masses. Other: There is a small amount of ascites in the pelvis. The abdominal wall shows diffuse edema and anasarca suggestive of malnutrition and hypoproteinemia. No hernias identified. Musculoskeletal: No acute or significant osseous findings. IMPRESSION: 1. Marked progressive steatosis of the liver with progression to more overt cirrhosis. Associated small amount of ascites in the pelvis. There also are small bilateral pleural effusions. 2. Stable submucosal fat infiltration of the cecum and ascending colon. This can be associated with chronic inflammatory conditions. No acute inflammation or obstruction identified. 3. Diffuse body wall edema and anasarca is suggestive of malnutrition and  hypoproteinemia. Electronically Signed   By: GAletta EdouardM.D.   On: 04/29/2017 21:44    Medications:  Prior to Admission:  Prescriptions Prior to Admission  Medication Sig Dispense Refill Last Dose  . acetaminophen (TYLENOL) 325 MG tablet Take 2 tablets (650 mg total) by mouth every 6 (six) hours as needed for mild pain (or Fever >/= 101).   Past Week at Unknown time  . albuterol (PROVENTIL HFA;VENTOLIN HFA) 108 (90 Base) MCG/ACT inhaler Inhale 2 puffs into the lungs 4 (four) times daily.  04/29/2017 at Unknown time  . apixaban (ELIQUIS) 5 MG TABS tablet Take 1 tablet (5 mg total) by mouth 2 (two) times daily. 60 tablet 5 04/29/2017 at 1030-1100a  . calcium carbonate (TUMS - DOSED IN MG ELEMENTAL CALCIUM) 500 MG chewable tablet Chew 1 tablet by mouth 2 (two) times daily.   04/29/2017 at Unknown time  . carboxymethylcellulose (REFRESH PLUS) 0.5 % SOLN Apply 1 drop to eye daily as needed (for dry eyes).   Past Week at Unknown time  . cetirizine (ZYRTEC) 10 MG tablet Take 10 mg by mouth daily.   04/29/2017 at Unknown time  . cholecalciferol (VITAMIN D) 1000 units tablet Take 1,000 Units by mouth every morning.   04/29/2017 at Unknown time  . colchicine 0.6 MG tablet Take 1 tablet (0.6 mg total) by mouth daily. 30 tablet 0 04/29/2017 at Unknown time  . diltiazem (CARDIZEM CD) 120 MG 24 hr capsule Take 1 capsule (120 mg total) by mouth daily. 90 capsule 3 04/29/2017 at Unknown time  . escitalopram (LEXAPRO) 20 MG tablet Take 1 tablet (20 mg total) by mouth daily. 30 tablet 12 04/29/2017 at Unknown time  . fluticasone furoate-vilanterol (BREO ELLIPTA) 200-25 MCG/INH AEPB Inhale 1 puff into the lungs daily.   04/29/2017 at Unknown time  . furosemide (LASIX) 20 MG tablet Take 1 tablet (20 mg total) by mouth daily. 30 tablet 0 04/29/2017 at Unknown time  . gabapentin (NEURONTIN) 100 MG capsule Take 2 capsules (200 mg total) by mouth at bedtime. 30 capsule 0 04/28/2017 at Unknown time  . levothyroxine (SYNTHROID,  LEVOTHROID) 137 MCG tablet Take 137 mcg by mouth daily before breakfast.   04/29/2017 at Unknown time  . loperamide (IMODIUM) 2 MG capsule Take 1 capsule (2 mg total) by mouth every morning. (Patient taking differently: Take 2 mg by mouth every morning. *May take up to 4 times daily as needed for loose stools and diarrhea) 90 capsule 5 Past Week at Unknown time  . metoprolol tartrate (LOPRESSOR) 25 MG tablet TAKE 1/2 TABLET BY MOUTH TWICE DAILY 90 tablet 1 04/29/2017 at 1030-1100a  . omeprazole (PRILOSEC) 20 MG capsule TAKE ONE CAPSULE BY MOUTH DAILY 90 capsule 3 04/29/2017 at Unknown time  . Pediatric Multivitamins-Iron (FLINTSTONES PLUS IRON) chewable tablet Chew 1 tablet by mouth 2 (two) times daily.   04/29/2017 at Unknown time  . potassium chloride SA (K-DUR,KLOR-CON) 20 MEQ tablet Take 1 tablet (20 mEq total) by mouth daily. 30 tablet 0 04/29/2017 at Unknown time  . sodium bicarbonate 650 MG tablet Take 1 tablet (650 mg total) by mouth 2 (two) times daily. 60 tablet 0 04/29/2017 at Unknown time  . triamcinolone (NASACORT ALLERGY 24HR) 55 MCG/ACT AERO nasal inhaler Place 2 sprays into the nose daily.   unknown  . vancomycin (VANCOCIN) 50 mg/mL oral solution Take 2.5 mLs (125 mg total) by mouth 2 (two) times daily. 70 mL 0 04/28/2017 at Unknown time  . allopurinol (ZYLOPRIM) 100 MG tablet Take 150 mg by mouth daily.  0   . Amino Acids-Protein Hydrolys (FEEDING SUPPLEMENT, PRO-STAT SUGAR FREE 64,) LIQD Take 30 mLs by mouth 2 (two) times daily. (Patient not taking: Reported on 04/29/2017) 900 mL 0 Not Taking at Unknown time  . Doxepin HCl (SILENOR) 6 MG TABS Take 1 tablet (6 mg total) by mouth at bedtime as needed (for sleep). (Patient not taking: Reported on 04/29/2017) 10 tablet 0 Not Taking at Unknown time  . insulin glargine (LANTUS) 100 UNIT/ML injection Inject 0.2 mLs (  20 Units total) into the skin daily. (Patient not taking: Reported on 04/29/2017) 10 mL 11 Not Taking at Unknown time  . ondansetron (ZOFRAN-ODT) 4  MG disintegrating tablet Take 1 tablet (4 mg total) by mouth 3 (three) times daily before meals. (Patient not taking: Reported on 04/29/2017) 20 tablet 0 Not Taking at Unknown time   Scheduled: . albuterol  3 mL Inhalation TID  . allopurinol  150 mg Oral Daily  . apixaban  5 mg Oral BID  . calcium carbonate  1 tablet Oral BID  . cholecalciferol  1,000 Units Oral q morning - 10a  . colchicine  0.6 mg Oral Daily  . diltiazem  120 mg Oral Daily  . escitalopram  20 mg Oral Daily  . feeding supplement  1 Container Oral TID BM  . feeding supplement (PRO-STAT SUGAR FREE 64)  30 mL Oral BID  . fluticasone furoate-vilanterol  1 puff Inhalation Daily  . gabapentin  200 mg Oral QHS  . insulin aspart  0-9 Units Subcutaneous TID WC  . levothyroxine  137 mcg Oral QAC breakfast  . metoprolol tartrate  12.5 mg Oral BID  . multivitamin with minerals  1 tablet Oral BID  . potassium chloride SA  20 mEq Oral Daily  . sodium bicarbonate  650 mg Oral BID  . sodium chloride flush  3 mL Intravenous Q12H  . torsemide  20 mg Oral BID  . triamcinolone  2 spray Nasal Daily  . vancomycin  125 mg Oral BID   Continuous: . sodium chloride    . doxycycline (VIBRAMYCIN) IV Stopped (05/01/17 0129)   PQA:ESLPNP chloride, acetaminophen **OR** acetaminophen, acetaminophen, ondansetron (ZOFRAN) IV, polyvinyl alcohol, sodium chloride flush  Assesment: She has cellulitis of the abdominal wall. She has multiple other medical problems as noted. She seems to have a little more edema. She is known to have chronic diastolic heart failure. One of the problems we've had over the last several weeks is that with treatment of her heart failure with diuretics she develops increasing problems with acute on chronic renal failure. Despite that I think she's going to need more diuresis and she is getting now. Principal Problem:   Cellulitis Active Problems:   HTN (hypertension)   COPD (chronic obstructive pulmonary disease) (HCC)   PAF  (paroxysmal atrial fibrillation) (HCC)   Chronic anticoagulation   Chronic edema   Obesity   Diabetes mellitus type 2 in obese (HCC)   Chronic diastolic CHF (congestive heart failure) ([place])    Plan: Change to IV diuretics. Continue doxycycline    LOS: 2 days   Dannelle Rhymes L 05/01/2017, 8:44 AM

## 2017-05-02 LAB — CBC WITH DIFFERENTIAL/PLATELET
Basophils Absolute: 0 K/uL (ref 0.0–0.1)
Basophils Relative: 0 %
Eosinophils Absolute: 0.4 K/uL (ref 0.0–0.7)
Eosinophils Relative: 5 %
HCT: 26.5 % — ABNORMAL LOW (ref 36.0–46.0)
Hemoglobin: 8.4 g/dL — ABNORMAL LOW (ref 12.0–15.0)
Lymphocytes Relative: 22 %
Lymphs Abs: 1.9 K/uL (ref 0.7–4.0)
MCH: 31.8 pg (ref 26.0–34.0)
MCHC: 31.7 g/dL (ref 30.0–36.0)
MCV: 100.4 fL — ABNORMAL HIGH (ref 78.0–100.0)
Monocytes Absolute: 1.1 K/uL — ABNORMAL HIGH (ref 0.1–1.0)
Monocytes Relative: 13 %
Neutro Abs: 5.2 K/uL (ref 1.7–7.7)
Neutrophils Relative %: 60 %
Platelets: 198 K/uL (ref 150–400)
RBC: 2.64 MIL/uL — ABNORMAL LOW (ref 3.87–5.11)
RDW: 18.1 % — ABNORMAL HIGH (ref 11.5–15.5)
WBC: 8.6 K/uL (ref 4.0–10.5)

## 2017-05-02 LAB — BASIC METABOLIC PANEL WITH GFR
Anion gap: 7 (ref 5–15)
BUN: 8 mg/dL (ref 6–20)
CO2: 32 mmol/L (ref 22–32)
Calcium: 7.7 mg/dL — ABNORMAL LOW (ref 8.9–10.3)
Chloride: 97 mmol/L — ABNORMAL LOW (ref 101–111)
Creatinine, Ser: 1.1 mg/dL — ABNORMAL HIGH (ref 0.44–1.00)
GFR calc Af Amer: 56 mL/min — ABNORMAL LOW
GFR calc non Af Amer: 48 mL/min — ABNORMAL LOW
Glucose, Bld: 93 mg/dL (ref 65–99)
Potassium: 3.5 mmol/L (ref 3.5–5.1)
Sodium: 136 mmol/L (ref 135–145)

## 2017-05-02 LAB — GLUCOSE, CAPILLARY
GLUCOSE-CAPILLARY: 120 mg/dL — AB (ref 65–99)
GLUCOSE-CAPILLARY: 105 mg/dL — AB (ref 65–99)
GLUCOSE-CAPILLARY: 117 mg/dL — AB (ref 65–99)
Glucose-Capillary: 89 mg/dL (ref 65–99)

## 2017-05-02 NOTE — Progress Notes (Signed)
Subjective: She feels better. She has no new complaints. She still has some pain in her flank but it is better. Her swelling is better. She's able to eat.  Objective: Vital signs in last 24 hours: Temp:  [98 F (36.7 C)-99.3 F (37.4 C)] 98 F (36.7 C) (08/09 0500) Pulse Rate:  [58-78] 71 (08/09 0500) Resp:  [20] 20 (08/09 0500) BP: (105-111)/(36-46) 111/42 (08/09 0500) SpO2:  [94 %-100 %] 96 % (08/09 0733) Weight change:  Last BM Date: 04/30/17  Intake/Output from previous day: 08/08 0701 - 08/09 0700 In: 240 [P.O.:240] Out: 2450 [Urine:2450]  PHYSICAL EXAM General appearance: alert, cooperative and no distress Resp: clear to auscultation bilaterally Cardio: regular rate and rhythm, S1, S2 normal, no murmur, click, rub or gallop GI: She still has swelling and erythema of the right flank area but it is significantly better Extremities: venous stasis dermatitis noted Skin turgor good  Lab Results:  Results for orders placed or performed during the hospital encounter of 04/29/17 (from the past 48 hour(s))  Glucose, capillary     Status: None   Collection Time: 04/30/17 11:52 AM  Result Value Ref Range   Glucose-Capillary 79 65 - 99 mg/dL  C difficile quick scan w PCR reflex     Status: None   Collection Time: 04/30/17  4:55 PM  Result Value Ref Range   C Diff antigen NEGATIVE NEGATIVE   C Diff toxin NEGATIVE NEGATIVE   C Diff interpretation No C. difficile detected.   Glucose, capillary     Status: None   Collection Time: 04/30/17  5:14 PM  Result Value Ref Range   Glucose-Capillary 97 65 - 99 mg/dL   Comment 1 Notify RN    Comment 2 Document in Chart   Glucose, capillary     Status: None   Collection Time: 04/30/17  9:23 PM  Result Value Ref Range   Glucose-Capillary 81 65 - 99 mg/dL   Comment 1 Notify RN    Comment 2 Document in Chart   CBC with Differential/Platelet     Status: Abnormal   Collection Time: 05/01/17  7:42 AM  Result Value Ref Range   WBC 8.8  4.0 - 10.5 K/uL   RBC 2.71 (L) 3.87 - 5.11 MIL/uL   Hemoglobin 8.9 (L) 12.0 - 15.0 g/dL   HCT 27.1 (L) 36.0 - 46.0 %   MCV 100.0 78.0 - 100.0 fL   MCH 32.8 26.0 - 34.0 pg   MCHC 32.8 30.0 - 36.0 g/dL   RDW 18.1 (H) 11.5 - 15.5 %   Platelets 214 150 - 400 K/uL   Neutrophils Relative % 62 %   Neutro Abs 5.5 1.7 - 7.7 K/uL   Lymphocytes Relative 20 %   Lymphs Abs 1.7 0.7 - 4.0 K/uL   Monocytes Relative 12 %   Monocytes Absolute 1.0 0.1 - 1.0 K/uL   Eosinophils Relative 6 %   Eosinophils Absolute 0.5 0.0 - 0.7 K/uL   Basophils Relative 0 %   Basophils Absolute 0.0 0.0 - 0.1 K/uL  Basic metabolic panel     Status: Abnormal   Collection Time: 05/01/17  7:42 AM  Result Value Ref Range   Sodium 136 135 - 145 mmol/L   Potassium 3.8 3.5 - 5.1 mmol/L   Chloride 97 (L) 101 - 111 mmol/L   CO2 30 22 - 32 mmol/L   Glucose, Bld 84 65 - 99 mg/dL   BUN 5 (L) 6 - 20 mg/dL   Creatinine,  Ser 0.93 0.44 - 1.00 mg/dL   Calcium 7.9 (L) 8.9 - 10.3 mg/dL   GFR calc non Af Amer 59 (L) >60 mL/min   GFR calc Af Amer >60 >60 mL/min    Comment: (NOTE) The eGFR has been calculated using the CKD EPI equation. This calculation has not been validated in all clinical situations. eGFR's persistently <60 mL/min signify possible Chronic Kidney Disease.    Anion gap 9 5 - 15  Glucose, capillary     Status: None   Collection Time: 05/01/17  7:59 AM  Result Value Ref Range   Glucose-Capillary 82 65 - 99 mg/dL   Comment 1 Notify RN    Comment 2 Document in Chart   Glucose, capillary     Status: Abnormal   Collection Time: 05/01/17 12:25 PM  Result Value Ref Range   Glucose-Capillary 121 (H) 65 - 99 mg/dL   Comment 1 Notify RN    Comment 2 Document in Chart   Glucose, capillary     Status: Abnormal   Collection Time: 05/01/17  4:09 PM  Result Value Ref Range   Glucose-Capillary 119 (H) 65 - 99 mg/dL   Comment 1 Notify RN    Comment 2 Document in Chart   Glucose, capillary     Status: Abnormal    Collection Time: 05/01/17  9:54 PM  Result Value Ref Range   Glucose-Capillary 141 (H) 65 - 99 mg/dL   Comment 1 Notify RN    Comment 2 Document in Chart   CBC with Differential/Platelet     Status: Abnormal   Collection Time: 05/02/17  6:02 AM  Result Value Ref Range   WBC 8.6 4.0 - 10.5 K/uL   RBC 2.64 (L) 3.87 - 5.11 MIL/uL   Hemoglobin 8.4 (L) 12.0 - 15.0 g/dL   HCT 26.5 (L) 36.0 - 46.0 %   MCV 100.4 (H) 78.0 - 100.0 fL   MCH 31.8 26.0 - 34.0 pg   MCHC 31.7 30.0 - 36.0 g/dL   RDW 18.1 (H) 11.5 - 15.5 %   Platelets 198 150 - 400 K/uL   Neutrophils Relative % 60 %   Neutro Abs 5.2 1.7 - 7.7 K/uL   Lymphocytes Relative 22 %   Lymphs Abs 1.9 0.7 - 4.0 K/uL   Monocytes Relative 13 %   Monocytes Absolute 1.1 (H) 0.1 - 1.0 K/uL   Eosinophils Relative 5 %   Eosinophils Absolute 0.4 0.0 - 0.7 K/uL   Basophils Relative 0 %   Basophils Absolute 0.0 0.0 - 0.1 K/uL  Basic metabolic panel     Status: Abnormal   Collection Time: 05/02/17  6:02 AM  Result Value Ref Range   Sodium 136 135 - 145 mmol/L   Potassium 3.5 3.5 - 5.1 mmol/L   Chloride 97 (L) 101 - 111 mmol/L   CO2 32 22 - 32 mmol/L   Glucose, Bld 93 65 - 99 mg/dL   BUN 8 6 - 20 mg/dL   Creatinine, Ser 1.10 (H) 0.44 - 1.00 mg/dL   Calcium 7.7 (L) 8.9 - 10.3 mg/dL   GFR calc non Af Amer 48 (L) >60 mL/min   GFR calc Af Amer 56 (L) >60 mL/min    Comment: (NOTE) The eGFR has been calculated using the CKD EPI equation. This calculation has not been validated in all clinical situations. eGFR's persistently <60 mL/min signify possible Chronic Kidney Disease.    Anion gap 7 5 - 15  Glucose, capillary  Status: None   Collection Time: 05/02/17  7:32 AM  Result Value Ref Range   Glucose-Capillary 89 65 - 99 mg/dL   Comment 1 Notify RN    Comment 2 Document in Chart     ABGS No results for input(s): PHART, PO2ART, TCO2, HCO3 in the last 72 hours.  Invalid input(s): PCO2 CULTURES Recent Results (from the past 240  hour(s))  Culture, blood (Routine X 2) w Reflex to ID Panel     Status: None (Preliminary result)   Collection Time: 04/29/17  8:18 PM  Result Value Ref Range Status   Specimen Description BLOOD RIGHT ARM  Final   Special Requests   Final    BOTTLES DRAWN AEROBIC AND ANAEROBIC Blood Culture adequate volume   Culture NO GROWTH 3 DAYS  Final   Report Status PENDING  Incomplete  Culture, blood (Routine X 2) w Reflex to ID Panel     Status: None (Preliminary result)   Collection Time: 04/29/17  8:26 PM  Result Value Ref Range Status   Specimen Description BLOOD RIGHT ARM  Final   Special Requests   Final    BOTTLES DRAWN AEROBIC AND ANAEROBIC Blood Culture adequate volume   Culture NO GROWTH 3 DAYS  Final   Report Status PENDING  Incomplete  MRSA PCR Screening     Status: None   Collection Time: 04/30/17  1:00 AM  Result Value Ref Range Status   MRSA by PCR NEGATIVE NEGATIVE Final    Comment:        The GeneXpert MRSA Assay (FDA approved for NASAL specimens only), is one component of a comprehensive MRSA colonization surveillance program. It is not intended to diagnose MRSA infection nor to guide or monitor treatment for MRSA infections.   C difficile quick scan w PCR reflex     Status: None   Collection Time: 04/30/17  4:55 PM  Result Value Ref Range Status   C Diff antigen NEGATIVE NEGATIVE Final   C Diff toxin NEGATIVE NEGATIVE Final   C Diff interpretation No C. difficile detected.  Final   Studies/Results: No results found.  Medications:  Prior to Admission:  Prescriptions Prior to Admission  Medication Sig Dispense Refill Last Dose  . acetaminophen (TYLENOL) 325 MG tablet Take 2 tablets (650 mg total) by mouth every 6 (six) hours as needed for mild pain (or Fever >/= 101).   Past Week at Unknown time  . albuterol (PROVENTIL HFA;VENTOLIN HFA) 108 (90 Base) MCG/ACT inhaler Inhale 2 puffs into the lungs 4 (four) times daily.    04/29/2017 at Unknown time  . apixaban  (ELIQUIS) 5 MG TABS tablet Take 1 tablet (5 mg total) by mouth 2 (two) times daily. 60 tablet 5 04/29/2017 at 1030-1100a  . calcium carbonate (TUMS - DOSED IN MG ELEMENTAL CALCIUM) 500 MG chewable tablet Chew 1 tablet by mouth 2 (two) times daily.   04/29/2017 at Unknown time  . carboxymethylcellulose (REFRESH PLUS) 0.5 % SOLN Apply 1 drop to eye daily as needed (for dry eyes).   Past Week at Unknown time  . cetirizine (ZYRTEC) 10 MG tablet Take 10 mg by mouth daily.   04/29/2017 at Unknown time  . cholecalciferol (VITAMIN D) 1000 units tablet Take 1,000 Units by mouth every morning.   04/29/2017 at Unknown time  . colchicine 0.6 MG tablet Take 1 tablet (0.6 mg total) by mouth daily. 30 tablet 0 04/29/2017 at Unknown time  . diltiazem (CARDIZEM CD) 120 MG 24 hr capsule  Take 1 capsule (120 mg total) by mouth daily. 90 capsule 3 04/29/2017 at Unknown time  . escitalopram (LEXAPRO) 20 MG tablet Take 1 tablet (20 mg total) by mouth daily. 30 tablet 12 04/29/2017 at Unknown time  . fluticasone furoate-vilanterol (BREO ELLIPTA) 200-25 MCG/INH AEPB Inhale 1 puff into the lungs daily.   04/29/2017 at Unknown time  . furosemide (LASIX) 20 MG tablet Take 1 tablet (20 mg total) by mouth daily. 30 tablet 0 04/29/2017 at Unknown time  . gabapentin (NEURONTIN) 100 MG capsule Take 2 capsules (200 mg total) by mouth at bedtime. 30 capsule 0 04/28/2017 at Unknown time  . levothyroxine (SYNTHROID, LEVOTHROID) 137 MCG tablet Take 137 mcg by mouth daily before breakfast.   04/29/2017 at Unknown time  . loperamide (IMODIUM) 2 MG capsule Take 1 capsule (2 mg total) by mouth every morning. (Patient taking differently: Take 2 mg by mouth every morning. *May take up to 4 times daily as needed for loose stools and diarrhea) 90 capsule 5 Past Week at Unknown time  . metoprolol tartrate (LOPRESSOR) 25 MG tablet TAKE 1/2 TABLET BY MOUTH TWICE DAILY 90 tablet 1 04/29/2017 at 1030-1100a  . omeprazole (PRILOSEC) 20 MG capsule TAKE ONE CAPSULE BY MOUTH DAILY  90 capsule 3 04/29/2017 at Unknown time  . Pediatric Multivitamins-Iron (FLINTSTONES PLUS IRON) chewable tablet Chew 1 tablet by mouth 2 (two) times daily.   04/29/2017 at Unknown time  . potassium chloride SA (K-DUR,KLOR-CON) 20 MEQ tablet Take 1 tablet (20 mEq total) by mouth daily. 30 tablet 0 04/29/2017 at Unknown time  . sodium bicarbonate 650 MG tablet Take 1 tablet (650 mg total) by mouth 2 (two) times daily. 60 tablet 0 04/29/2017 at Unknown time  . triamcinolone (NASACORT ALLERGY 24HR) 55 MCG/ACT AERO nasal inhaler Place 2 sprays into the nose daily.   unknown  . vancomycin (VANCOCIN) 50 mg/mL oral solution Take 2.5 mLs (125 mg total) by mouth 2 (two) times daily. 70 mL 0 04/28/2017 at Unknown time  . allopurinol (ZYLOPRIM) 100 MG tablet Take 150 mg by mouth daily.  0   . Amino Acids-Protein Hydrolys (FEEDING SUPPLEMENT, PRO-STAT SUGAR FREE 64,) LIQD Take 30 mLs by mouth 2 (two) times daily. (Patient not taking: Reported on 04/29/2017) 900 mL 0 Not Taking at Unknown time  . Doxepin HCl (SILENOR) 6 MG TABS Take 1 tablet (6 mg total) by mouth at bedtime as needed (for sleep). (Patient not taking: Reported on 04/29/2017) 10 tablet 0 Not Taking at Unknown time  . insulin glargine (LANTUS) 100 UNIT/ML injection Inject 0.2 mLs (20 Units total) into the skin daily. (Patient not taking: Reported on 04/29/2017) 10 mL 11 Not Taking at Unknown time  . ondansetron (ZOFRAN-ODT) 4 MG disintegrating tablet Take 1 tablet (4 mg total) by mouth 3 (three) times daily before meals. (Patient not taking: Reported on 04/29/2017) 20 tablet 0 Not Taking at Unknown time   Scheduled: . albuterol  3 mL Inhalation TID  . allopurinol  150 mg Oral Daily  . apixaban  5 mg Oral BID  . calcium carbonate  1 tablet Oral BID  . cholecalciferol  1,000 Units Oral q morning - 10a  . colchicine  0.6 mg Oral Daily  . diltiazem  120 mg Oral Daily  . escitalopram  20 mg Oral Daily  . feeding supplement  1 Container Oral TID BM  . feeding  supplement (PRO-STAT SUGAR FREE 64)  30 mL Oral BID  . fluticasone  2 spray Each  Nare Daily  . fluticasone furoate-vilanterol  1 puff Inhalation Daily  . furosemide  40 mg Intravenous Q12H  . gabapentin  200 mg Oral QHS  . insulin aspart  0-9 Units Subcutaneous TID WC  . levothyroxine  137 mcg Oral QAC breakfast  . metoprolol tartrate  12.5 mg Oral BID  . multivitamin with minerals  1 tablet Oral BID  . potassium chloride SA  20 mEq Oral Daily  . sodium bicarbonate  650 mg Oral BID  . sodium chloride flush  3 mL Intravenous Q12H  . vancomycin  125 mg Oral BID   Continuous: . sodium chloride    . doxycycline (VIBRAMYCIN) IV Stopped (05/01/17 2353)   EXN:TZGYFV chloride, acetaminophen **OR** acetaminophen, acetaminophen, ondansetron (ZOFRAN) IV, polyvinyl alcohol, sodium chloride flush  Assesment: She was admitted with cellulitis of the abdominal wall. This is improving. She has COPD at baseline and that is pretty stable. She has paroxysmal atrial fib but she is in sinus rhythm now. She is chronically anticoagulated. She has chronic diastolic heart failure and she does have some fluid overload. She has chronic renal failure and that gets much worse when she is aggressively diuresed so we are trying to avoid that. She has diabetes which is stable. She has had C. difficile but both antigen and antibody are negative so we'll discontinue treatment Principal Problem:   Cellulitis Active Problems:   HTN (hypertension)   COPD (chronic obstructive pulmonary disease) (HCC)   PAF (paroxysmal atrial fibrillation) (HCC)   Chronic anticoagulation   Chronic edema   Obesity   Diabetes mellitus type 2 in obese (HCC)   Chronic diastolic CHF (congestive heart failure) ([place])    Plan: Discontinue vancomycin. Continue other treatments. Possible discharge tomorrow    LOS: 3 days   Laticha Ferrucci L 05/02/2017, 9:26 AM

## 2017-05-02 NOTE — Progress Notes (Signed)
Noted patient right arm weeping, small amount near antecubital area.  Patient states area is tender to touch, area slightly reddened.

## 2017-05-03 LAB — BASIC METABOLIC PANEL
Anion gap: 9 (ref 5–15)
BUN: 10 mg/dL (ref 6–20)
CHLORIDE: 93 mmol/L — AB (ref 101–111)
CO2: 34 mmol/L — ABNORMAL HIGH (ref 22–32)
CREATININE: 1.24 mg/dL — AB (ref 0.44–1.00)
Calcium: 7.6 mg/dL — ABNORMAL LOW (ref 8.9–10.3)
GFR, EST AFRICAN AMERICAN: 48 mL/min — AB (ref >60)
GFR, EST NON AFRICAN AMERICAN: 42 mL/min — AB (ref >60)
Glucose, Bld: 84 mg/dL (ref 65–99)
POTASSIUM: 3.3 mmol/L — AB (ref 3.5–5.1)
SODIUM: 136 mmol/L (ref 135–145)

## 2017-05-03 LAB — GLUCOSE, CAPILLARY
GLUCOSE-CAPILLARY: 95 mg/dL (ref 65–99)
GLUCOSE-CAPILLARY: 135 mg/dL — AB (ref 65–99)
Glucose-Capillary: 97 mg/dL (ref 65–99)
Glucose-Capillary: 122 mg/dL — ABNORMAL HIGH (ref 65–99)

## 2017-05-03 LAB — CBC WITH DIFFERENTIAL/PLATELET
BASOS PCT: 1 %
Basophils Absolute: 0 10*3/uL (ref 0.0–0.1)
EOS ABS: 0.4 10*3/uL (ref 0.0–0.7)
Eosinophils Relative: 5 %
HCT: 27.2 % — ABNORMAL LOW (ref 36.0–46.0)
HEMOGLOBIN: 8.7 g/dL — AB (ref 12.0–15.0)
Lymphocytes Relative: 19 %
Lymphs Abs: 1.6 10*3/uL (ref 0.7–4.0)
MCH: 31.8 pg (ref 26.0–34.0)
MCHC: 32 g/dL (ref 30.0–36.0)
MCV: 99.3 fL (ref 78.0–100.0)
Monocytes Absolute: 1.1 10*3/uL — ABNORMAL HIGH (ref 0.1–1.0)
Monocytes Relative: 13 %
NEUTROS PCT: 62 %
Neutro Abs: 5.2 10*3/uL (ref 1.7–7.7)
Platelets: 208 10*3/uL (ref 150–400)
RBC: 2.74 MIL/uL — AB (ref 3.87–5.11)
RDW: 18.1 % — ABNORMAL HIGH (ref 11.5–15.5)
WBC: 8.3 10*3/uL (ref 4.0–10.5)

## 2017-05-03 NOTE — Progress Notes (Signed)
Patient SPO2: 92 % on Room Air.

## 2017-05-03 NOTE — Care Management Important Message (Signed)
Important Message  Patient Details  Name: Yolanda [name] MRN: [id number] Date of Birth: 12-23-1940   Medicare Important Message Given:  Yes    Malcolm Metro[place], Lavender Stanke Demske, RN 05/03/2017, 12:43 PM

## 2017-05-03 NOTE — Progress Notes (Signed)
Attempted to wean pt from 2L O2 to room air. SpO2 85% on room air. Reapplied 2 L O2 via [place]. The pt's left arm is very swollen, red and warm to the touch. She states that she has noticed it is more swollen than it was yesterday during the day.

## 2017-05-03 NOTE — Progress Notes (Signed)
Subjective: She was admitted with cellulitis of the abdominal wall. However she has increasing swelling of her left arm and I think she's probably going to need to stay at least another day. I'm not certain if this is cellulitis or not. She has fairly significant generalized edema even at her best  Objective: Vital signs in last 24 hours: Temp:  [97.9 F (36.6 C)-98.1 F (36.7 C)] 97.9 F (36.6 C) (08/10 0542) Pulse Rate:  [69-83] 72 (08/10 0542) Resp:  [18-20] 18 (08/10 0542) BP: (103-112)/(41-61) 110/59 (08/10 0542) SpO2:  [94 %-99 %] 94 % (08/10 0748) Weight change:  Last BM Date: 05/02/17  Intake/Output from previous day: 08/09 0701 - 08/10 0700 In: 720 [P.O.:720] Out: 1750 [Urine:1750]  PHYSICAL EXAM General appearance: alert, cooperative and no distress Resp: clear to auscultation bilaterally Cardio: regular rate and rhythm, S1, S2 normal, no murmur, click, rub or gallop GI: The cellulitis of her abdominal wall is better Extremities: She has swelling and erythema of her left arm Mucous membranes are moist  Lab Results:  Results for orders placed or performed during the hospital encounter of 04/29/17 (from the past 48 hour(s))  Glucose, capillary     Status: Abnormal   Collection Time: 05/01/17 12:25 PM  Result Value Ref Range   Glucose-Capillary 121 (H) 65 - 99 mg/dL   Comment 1 Notify RN    Comment 2 Document in Chart   Glucose, capillary     Status: Abnormal   Collection Time: 05/01/17  4:09 PM  Result Value Ref Range   Glucose-Capillary 119 (H) 65 - 99 mg/dL   Comment 1 Notify RN    Comment 2 Document in Chart   Glucose, capillary     Status: Abnormal   Collection Time: 05/01/17  9:54 PM  Result Value Ref Range   Glucose-Capillary 141 (H) 65 - 99 mg/dL   Comment 1 Notify RN    Comment 2 Document in Chart   CBC with Differential/Platelet     Status: Abnormal   Collection Time: 05/02/17  6:02 AM  Result Value Ref Range   WBC 8.6 4.0 - 10.5 K/uL   RBC 2.64  (L) 3.87 - 5.11 MIL/uL   Hemoglobin 8.4 (L) 12.0 - 15.0 g/dL   HCT 26.5 (L) 36.0 - 46.0 %   MCV 100.4 (H) 78.0 - 100.0 fL   MCH 31.8 26.0 - 34.0 pg   MCHC 31.7 30.0 - 36.0 g/dL   RDW 18.1 (H) 11.5 - 15.5 %   Platelets 198 150 - 400 K/uL   Neutrophils Relative % 60 %   Neutro Abs 5.2 1.7 - 7.7 K/uL   Lymphocytes Relative 22 %   Lymphs Abs 1.9 0.7 - 4.0 K/uL   Monocytes Relative 13 %   Monocytes Absolute 1.1 (H) 0.1 - 1.0 K/uL   Eosinophils Relative 5 %   Eosinophils Absolute 0.4 0.0 - 0.7 K/uL   Basophils Relative 0 %   Basophils Absolute 0.0 0.0 - 0.1 K/uL  Basic metabolic panel     Status: Abnormal   Collection Time: 05/02/17  6:02 AM  Result Value Ref Range   Sodium 136 135 - 145 mmol/L   Potassium 3.5 3.5 - 5.1 mmol/L   Chloride 97 (L) 101 - 111 mmol/L   CO2 32 22 - 32 mmol/L   Glucose, Bld 93 65 - 99 mg/dL   BUN 8 6 - 20 mg/dL   Creatinine, Ser 1.10 (H) 0.44 - 1.00 mg/dL   Calcium 7.7 (  L) 8.9 - 10.3 mg/dL   GFR calc non Af Amer 48 (L) >60 mL/min   GFR calc Af Amer 56 (L) >60 mL/min    Comment: (NOTE) The eGFR has been calculated using the CKD EPI equation. This calculation has not been validated in all clinical situations. eGFR's persistently <60 mL/min signify possible Chronic Kidney Disease.    Anion gap 7 5 - 15  Glucose, capillary     Status: None   Collection Time: 05/02/17  7:32 AM  Result Value Ref Range   Glucose-Capillary 89 65 - 99 mg/dL   Comment 1 Notify RN    Comment 2 Document in Chart   Glucose, capillary     Status: Abnormal   Collection Time: 05/02/17 11:21 AM  Result Value Ref Range   Glucose-Capillary 120 (H) 65 - 99 mg/dL   Comment 1 Notify RN    Comment 2 Document in Chart   Glucose, capillary     Status: Abnormal   Collection Time: 05/02/17  4:41 PM  Result Value Ref Range   Glucose-Capillary 105 (H) 65 - 99 mg/dL   Comment 1 Notify RN    Comment 2 Document in Chart   Glucose, capillary     Status: Abnormal   Collection Time: 05/02/17   9:37 PM  Result Value Ref Range   Glucose-Capillary 117 (H) 65 - 99 mg/dL   Comment 1 Notify RN    Comment 2 Document in Chart   CBC with Differential/Platelet     Status: Abnormal   Collection Time: 05/03/17  6:50 AM  Result Value Ref Range   WBC 8.3 4.0 - 10.5 K/uL   RBC 2.74 (L) 3.87 - 5.11 MIL/uL   Hemoglobin 8.7 (L) 12.0 - 15.0 g/dL   HCT 27.2 (L) 36.0 - 46.0 %   MCV 99.3 78.0 - 100.0 fL   MCH 31.8 26.0 - 34.0 pg   MCHC 32.0 30.0 - 36.0 g/dL   RDW 18.1 (H) 11.5 - 15.5 %   Platelets 208 150 - 400 K/uL   Neutrophils Relative % 62 %   Neutro Abs 5.2 1.7 - 7.7 K/uL   Lymphocytes Relative 19 %   Lymphs Abs 1.6 0.7 - 4.0 K/uL   Monocytes Relative 13 %   Monocytes Absolute 1.1 (H) 0.1 - 1.0 K/uL   Eosinophils Relative 5 %   Eosinophils Absolute 0.4 0.0 - 0.7 K/uL   Basophils Relative 1 %   Basophils Absolute 0.0 0.0 - 0.1 K/uL  Basic metabolic panel     Status: Abnormal   Collection Time: 05/03/17  6:50 AM  Result Value Ref Range   Sodium 136 135 - 145 mmol/L   Potassium 3.3 (L) 3.5 - 5.1 mmol/L   Chloride 93 (L) 101 - 111 mmol/L   CO2 34 (H) 22 - 32 mmol/L   Glucose, Bld 84 65 - 99 mg/dL   BUN 10 6 - 20 mg/dL   Creatinine, Ser 1.24 (H) 0.44 - 1.00 mg/dL   Calcium 7.6 (L) 8.9 - 10.3 mg/dL   GFR calc non Af Amer 42 (L) >60 mL/min   GFR calc Af Amer 48 (L) >60 mL/min    Comment: (NOTE) The eGFR has been calculated using the CKD EPI equation. This calculation has not been validated in all clinical situations. eGFR's persistently <60 mL/min signify possible Chronic Kidney Disease.    Anion gap 9 5 - 15  Glucose, capillary     Status: None   Collection Time: 05/03/17  7:55 AM  Result Value Ref Range   Glucose-Capillary 95 65 - 99 mg/dL    ABGS No results for input(s): PHART, PO2ART, TCO2, HCO3 in the last 72 hours.  Invalid input(s): PCO2 CULTURES Recent Results (from the past 240 hour(s))  Culture, blood (Routine X 2) w Reflex to ID Panel     Status: None  (Preliminary result)   Collection Time: 04/29/17  8:18 PM  Result Value Ref Range Status   Specimen Description BLOOD RIGHT ARM  Final   Special Requests   Final    BOTTLES DRAWN AEROBIC AND ANAEROBIC Blood Culture adequate volume   Culture NO GROWTH 4 DAYS  Final   Report Status PENDING  Incomplete  Culture, blood (Routine X 2) w Reflex to ID Panel     Status: None (Preliminary result)   Collection Time: 04/29/17  8:26 PM  Result Value Ref Range Status   Specimen Description BLOOD RIGHT ARM  Final   Special Requests   Final    BOTTLES DRAWN AEROBIC AND ANAEROBIC Blood Culture adequate volume   Culture NO GROWTH 4 DAYS  Final   Report Status PENDING  Incomplete  MRSA PCR Screening     Status: None   Collection Time: 04/30/17  1:00 AM  Result Value Ref Range Status   MRSA by PCR NEGATIVE NEGATIVE Final    Comment:        The GeneXpert MRSA Assay (FDA approved for NASAL specimens only), is one component of a comprehensive MRSA colonization surveillance program. It is not intended to diagnose MRSA infection nor to guide or monitor treatment for MRSA infections.   C difficile quick scan w PCR reflex     Status: None   Collection Time: 04/30/17  4:55 PM  Result Value Ref Range Status   C Diff antigen NEGATIVE NEGATIVE Final   C Diff toxin NEGATIVE NEGATIVE Final   C Diff interpretation No C. difficile detected.  Final   Studies/Results: No results found.  Medications:  Prior to Admission:  Prescriptions Prior to Admission  Medication Sig Dispense Refill Last Dose  . acetaminophen (TYLENOL) 325 MG tablet Take 2 tablets (650 mg total) by mouth every 6 (six) hours as needed for mild pain (or Fever >/= 101).   Past Week at Unknown time  . albuterol (PROVENTIL HFA;VENTOLIN HFA) 108 (90 Base) MCG/ACT inhaler Inhale 2 puffs into the lungs 4 (four) times daily.    04/29/2017 at Unknown time  . apixaban (ELIQUIS) 5 MG TABS tablet Take 1 tablet (5 mg total) by mouth 2 (two) times daily.  60 tablet 5 04/29/2017 at 1030-1100a  . calcium carbonate (TUMS - DOSED IN MG ELEMENTAL CALCIUM) 500 MG chewable tablet Chew 1 tablet by mouth 2 (two) times daily.   04/29/2017 at Unknown time  . carboxymethylcellulose (REFRESH PLUS) 0.5 % SOLN Apply 1 drop to eye daily as needed (for dry eyes).   Past Week at Unknown time  . cetirizine (ZYRTEC) 10 MG tablet Take 10 mg by mouth daily.   04/29/2017 at Unknown time  . cholecalciferol (VITAMIN D) 1000 units tablet Take 1,000 Units by mouth every morning.   04/29/2017 at Unknown time  . colchicine 0.6 MG tablet Take 1 tablet (0.6 mg total) by mouth daily. 30 tablet 0 04/29/2017 at Unknown time  . diltiazem (CARDIZEM CD) 120 MG 24 hr capsule Take 1 capsule (120 mg total) by mouth daily. 90 capsule 3 04/29/2017 at Unknown time  . escitalopram (LEXAPRO) 20 MG tablet  Take 1 tablet (20 mg total) by mouth daily. 30 tablet 12 04/29/2017 at Unknown time  . fluticasone furoate-vilanterol (BREO ELLIPTA) 200-25 MCG/INH AEPB Inhale 1 puff into the lungs daily.   04/29/2017 at Unknown time  . furosemide (LASIX) 20 MG tablet Take 1 tablet (20 mg total) by mouth daily. 30 tablet 0 04/29/2017 at Unknown time  . gabapentin (NEURONTIN) 100 MG capsule Take 2 capsules (200 mg total) by mouth at bedtime. 30 capsule 0 04/28/2017 at Unknown time  . levothyroxine (SYNTHROID, LEVOTHROID) 137 MCG tablet Take 137 mcg by mouth daily before breakfast.   04/29/2017 at Unknown time  . loperamide (IMODIUM) 2 MG capsule Take 1 capsule (2 mg total) by mouth every morning. (Patient taking differently: Take 2 mg by mouth every morning. *May take up to 4 times daily as needed for loose stools and diarrhea) 90 capsule 5 Past Week at Unknown time  . metoprolol tartrate (LOPRESSOR) 25 MG tablet TAKE 1/2 TABLET BY MOUTH TWICE DAILY 90 tablet 1 04/29/2017 at 1030-1100a  . omeprazole (PRILOSEC) 20 MG capsule TAKE ONE CAPSULE BY MOUTH DAILY 90 capsule 3 04/29/2017 at Unknown time  . Pediatric Multivitamins-Iron (FLINTSTONES  PLUS IRON) chewable tablet Chew 1 tablet by mouth 2 (two) times daily.   04/29/2017 at Unknown time  . potassium chloride SA (K-DUR,KLOR-CON) 20 MEQ tablet Take 1 tablet (20 mEq total) by mouth daily. 30 tablet 0 04/29/2017 at Unknown time  . sodium bicarbonate 650 MG tablet Take 1 tablet (650 mg total) by mouth 2 (two) times daily. 60 tablet 0 04/29/2017 at Unknown time  . triamcinolone (NASACORT ALLERGY 24HR) 55 MCG/ACT AERO nasal inhaler Place 2 sprays into the nose daily.   unknown  . vancomycin (VANCOCIN) 50 mg/mL oral solution Take 2.5 mLs (125 mg total) by mouth 2 (two) times daily. 70 mL 0 04/28/2017 at Unknown time  . allopurinol (ZYLOPRIM) 100 MG tablet Take 150 mg by mouth daily.  0   . Amino Acids-Protein Hydrolys (FEEDING SUPPLEMENT, PRO-STAT SUGAR FREE 64,) LIQD Take 30 mLs by mouth 2 (two) times daily. (Patient not taking: Reported on 04/29/2017) 900 mL 0 Not Taking at Unknown time  . Doxepin HCl (SILENOR) 6 MG TABS Take 1 tablet (6 mg total) by mouth at bedtime as needed (for sleep). (Patient not taking: Reported on 04/29/2017) 10 tablet 0 Not Taking at Unknown time  . insulin glargine (LANTUS) 100 UNIT/ML injection Inject 0.2 mLs (20 Units total) into the skin daily. (Patient not taking: Reported on 04/29/2017) 10 mL 11 Not Taking at Unknown time  . ondansetron (ZOFRAN-ODT) 4 MG disintegrating tablet Take 1 tablet (4 mg total) by mouth 3 (three) times daily before meals. (Patient not taking: Reported on 04/29/2017) 20 tablet 0 Not Taking at Unknown time   Scheduled: . albuterol  3 mL Inhalation TID  . allopurinol  150 mg Oral Daily  . apixaban  5 mg Oral BID  . calcium carbonate  1 tablet Oral BID  . cholecalciferol  1,000 Units Oral q morning - 10a  . colchicine  0.6 mg Oral Daily  . diltiazem  120 mg Oral Daily  . escitalopram  20 mg Oral Daily  . feeding supplement  1 Container Oral TID BM  . feeding supplement (PRO-STAT SUGAR FREE 64)  30 mL Oral BID  . fluticasone  2 spray Each Nare Daily   . fluticasone furoate-vilanterol  1 puff Inhalation Daily  . furosemide  40 mg Intravenous Q12H  . gabapentin  200 mg Oral QHS  . insulin aspart  0-9 Units Subcutaneous TID WC  . levothyroxine  137 mcg Oral QAC breakfast  . metoprolol tartrate  12.5 mg Oral BID  . multivitamin with minerals  1 tablet Oral BID  . potassium chloride SA  20 mEq Oral Daily  . sodium bicarbonate  650 mg Oral BID  . sodium chloride flush  3 mL Intravenous Q12H   Continuous: . sodium chloride    . doxycycline (VIBRAMYCIN) IV Stopped (05/03/17 0111)   HYQ:MVHQIO chloride, acetaminophen **OR** acetaminophen, acetaminophen, ondansetron (ZOFRAN) IV, polyvinyl alcohol, sodium chloride flush  Assesment: She has cellulitis of the abdominal wall which better. She may have cellulitis of her left arm. She has chronic diastolic heart failure and she still has some edema. She is chronically anticoagulated. At baseline she has COPD. She is improved but I don't want to send her home today because of the changes in her left arm. Principal Problem:   Cellulitis Active Problems:   HTN (hypertension)   COPD (chronic obstructive pulmonary disease) (HCC)   PAF (paroxysmal atrial fibrillation) (HCC)   Chronic anticoagulation   Chronic edema   Obesity   Diabetes mellitus type 2 in obese (HCC)   Chronic diastolic CHF (congestive heart failure) ([place])    Plan: Continue treatments.    LOS: 4 days   Rori Goar L 05/03/2017, 9:04 AM

## 2017-05-03 NOTE — Progress Notes (Signed)
PT Cancellation Note  Patient Details Name: Yolanda MurrainBrenda W [name] MRN: [id number] DOB: 1941/04/19   Cancelled Treatment:    Reason Eval/Treat Not Completed: Pain limiting ability to participate. States she is covered in compresses and will be willing to try maybe later today.  Check back as pt allows.   Ivar DrapeRuth E Jahzion Brogden 05/03/2017, 9:53 AM   Samul Dadauth Matea Stanard, PT MS Acute Rehab Dept. Number: [facility] [id number] and [facility] [phone]

## 2017-05-03 NOTE — Care Management Note (Signed)
Case Management Note  Patient Details  Name: Yolanda [name] MRN: [id number] Date of Birth: August 03, 1941  Expected Discharge Date:     05/03/2017            Expected Discharge Plan:  Home w Home Health Services  In-House Referral:  NA  Discharge planning Services  CM Consult  Post Acute Care Choice:  Home Health, Durable Medical Equipment, Resumption of Svcs/PTA Provider Choice offered to:  Patient  DME Arranged:  [place] rolling DME Agency:  [facility]  HH Arranged:  RN, [facility]  [facility]  Status of Service:  Completed, signed off  Additional Comments: Anticipate DC home tomorrow. Pt aware HH has 48 hrs to resume services. [facility] rep, aware of DC plan. Pt has weaned from oxygen and will not require supplemental oxygen at DC.   [name], Owen Pagnotta Demske, RN 05/03/2017, 12:41 PM

## 2017-05-04 LAB — CULTURE, BLOOD (ROUTINE X 2)
CULTURE: NO GROWTH
Culture: NO GROWTH
SPECIAL REQUESTS: ADEQUATE
Special Requests: ADEQUATE

## 2017-05-04 LAB — GLUCOSE, CAPILLARY
Glucose-Capillary: 86 mg/dL (ref 65–99)
Glucose-Capillary: 106 mg/dL — ABNORMAL HIGH (ref 65–99)
Glucose-Capillary: 100 mg/dL — ABNORMAL HIGH (ref 65–99)
Glucose-Capillary: 119 mg/dL — ABNORMAL HIGH (ref 65–99)

## 2017-05-04 LAB — BASIC METABOLIC PANEL
Anion gap: 11 (ref 5–15)
BUN: 12 mg/dL (ref 6–20)
CALCIUM: 7.5 mg/dL — AB (ref 8.9–10.3)
CO2: 32 mmol/L (ref 22–32)
CREATININE: 1.64 mg/dL — AB (ref 0.44–1.00)
Chloride: 91 mmol/L — ABNORMAL LOW (ref 101–111)
GFR calc Af Amer: 34 mL/min — ABNORMAL LOW (ref >60)
GFR calc non Af Amer: 30 mL/min — ABNORMAL LOW (ref >60)
GLUCOSE: 103 mg/dL — AB (ref 65–99)
Potassium: 3.6 mmol/L (ref 3.5–5.1)
Sodium: 134 mmol/L — ABNORMAL LOW (ref 135–145)

## 2017-05-04 NOTE — Progress Notes (Signed)
Pt is now on room air per RT, will monitor O2 sats. Falll mat placed at pts bedside d/t pt being high fall/bleeding risk d/t long term use of Eliquis. Bed alarm on, bed in lowest locked position. Call bell within reach. Pt educated on use for fall met, pt verbalized understanding. Will continue to monitor pt

## 2017-05-04 NOTE — Progress Notes (Signed)
97 saturation on 1 lpm oxygen will try dc and monitor  Will notify nurse.

## 2017-05-04 NOTE — Progress Notes (Signed)
Upon assessment RN noticed blood like blister on vaginal opening. RN educated pt on this and stated it may be from purewick catheter. Pt insisted she wanted to keep the external catheter in, moisturizer placed on vaginal area. Will continue to monitor

## 2017-05-04 NOTE — Progress Notes (Signed)
Subjective: She says she feels okay. She still has significant swelling of her left arm. Her swelling in the abdomen is better. She still has generalized puffiness.  Objective: Vital signs in last 24 hours: Temp:  [97.7 F (36.5 C)-98.3 F (36.8 C)] 98.2 F (36.8 C) (08/11 0603) Pulse Rate:  [72-86] 72 (08/11 0603) Resp:  [18-20] 18 (08/11 0603) BP: (98-109)/(38-43) 98/43 (08/11 0603) SpO2:  [87 %-99 %] 98 % (08/11 0805) Weight change:  Last BM Date: 05/02/17  Intake/Output from previous day: 08/10 0701 - 08/11 0700 In: 720 [P.O.:720] Out: 500 [Urine:500]  PHYSICAL EXAM General appearance: alert, cooperative and mild distress Resp: clear to auscultation bilaterally Cardio: regular rate and rhythm, S1, S2 normal, no murmur, click, rub or gallop GI: The erythema and cellulitis changes are improving but not now Extremities: She has generalized puffiness of the extremities. The left arm is much worse and it is more erythematous suggesting cellulitis there as well Appears mildly depressed  Lab Results:  Results for orders placed or performed during the hospital encounter of 04/29/17 (from the past 48 hour(s))  Glucose, capillary     Status: Abnormal   Collection Time: 05/02/17 11:21 AM  Result Value Ref Range   Glucose-Capillary 120 (H) 65 - 99 mg/dL   Comment 1 Notify RN    Comment 2 Document in Chart   Glucose, capillary     Status: Abnormal   Collection Time: 05/02/17  4:41 PM  Result Value Ref Range   Glucose-Capillary 105 (H) 65 - 99 mg/dL   Comment 1 Notify RN    Comment 2 Document in Chart   Glucose, capillary     Status: Abnormal   Collection Time: 05/02/17  9:37 PM  Result Value Ref Range   Glucose-Capillary 117 (H) 65 - 99 mg/dL   Comment 1 Notify RN    Comment 2 Document in Chart   CBC with Differential/Platelet     Status: Abnormal   Collection Time: 05/03/17  6:50 AM  Result Value Ref Range   WBC 8.3 4.0 - 10.5 K/uL   RBC 2.74 (L) 3.87 - 5.11 MIL/uL   Hemoglobin 8.7 (L) 12.0 - 15.0 g/dL   HCT 27.2 (L) 36.0 - 46.0 %   MCV 99.3 78.0 - 100.0 fL   MCH 31.8 26.0 - 34.0 pg   MCHC 32.0 30.0 - 36.0 g/dL   RDW 18.1 (H) 11.5 - 15.5 %   Platelets 208 150 - 400 K/uL   Neutrophils Relative % 62 %   Neutro Abs 5.2 1.7 - 7.7 K/uL   Lymphocytes Relative 19 %   Lymphs Abs 1.6 0.7 - 4.0 K/uL   Monocytes Relative 13 %   Monocytes Absolute 1.1 (H) 0.1 - 1.0 K/uL   Eosinophils Relative 5 %   Eosinophils Absolute 0.4 0.0 - 0.7 K/uL   Basophils Relative 1 %   Basophils Absolute 0.0 0.0 - 0.1 K/uL  Basic metabolic panel     Status: Abnormal   Collection Time: 05/03/17  6:50 AM  Result Value Ref Range   Sodium 136 135 - 145 mmol/L   Potassium 3.3 (L) 3.5 - 5.1 mmol/L   Chloride 93 (L) 101 - 111 mmol/L   CO2 34 (H) 22 - 32 mmol/L   Glucose, Bld 84 65 - 99 mg/dL   BUN 10 6 - 20 mg/dL   Creatinine, Ser 1.24 (H) 0.44 - 1.00 mg/dL   Calcium 7.6 (L) 8.9 - 10.3 mg/dL   GFR calc non  Af Amer 42 (L) >60 mL/min   GFR calc Af Amer 48 (L) >60 mL/min    Comment: (NOTE) The eGFR has been calculated using the CKD EPI equation. This calculation has not been validated in all clinical situations. eGFR's persistently <60 mL/min signify possible Chronic Kidney Disease.    Anion gap 9 5 - 15  Glucose, capillary     Status: None   Collection Time: 05/03/17  7:55 AM  Result Value Ref Range   Glucose-Capillary 95 65 - 99 mg/dL  Glucose, capillary     Status: Abnormal   Collection Time: 05/03/17 11:30 AM  Result Value Ref Range   Glucose-Capillary 135 (H) 65 - 99 mg/dL  Glucose, capillary     Status: None   Collection Time: 05/03/17  4:20 PM  Result Value Ref Range   Glucose-Capillary 97 65 - 99 mg/dL  Glucose, capillary     Status: Abnormal   Collection Time: 05/03/17  8:55 PM  Result Value Ref Range   Glucose-Capillary 122 (H) 65 - 99 mg/dL   Comment 1 Notify RN    Comment 2 Document in Chart   Glucose, capillary     Status: None   Collection Time:  05/04/17  7:45 AM  Result Value Ref Range   Glucose-Capillary 86 65 - 99 mg/dL    ABGS No results for input(s): PHART, PO2ART, TCO2, HCO3 in the last 72 hours.  Invalid input(s): PCO2 CULTURES Recent Results (from the past 240 hour(s))  Culture, blood (Routine X 2) w Reflex to ID Panel     Status: None   Collection Time: 04/29/17  8:18 PM  Result Value Ref Range Status   Specimen Description BLOOD RIGHT ARM  Final   Special Requests   Final    BOTTLES DRAWN AEROBIC AND ANAEROBIC Blood Culture adequate volume   Culture NO GROWTH 5 DAYS  Final   Report Status 05/04/2017 FINAL  Final  Culture, blood (Routine X 2) w Reflex to ID Panel     Status: None   Collection Time: 04/29/17  8:26 PM  Result Value Ref Range Status   Specimen Description BLOOD RIGHT ARM  Final   Special Requests   Final    BOTTLES DRAWN AEROBIC AND ANAEROBIC Blood Culture adequate volume   Culture NO GROWTH 5 DAYS  Final   Report Status 05/04/2017 FINAL  Final  MRSA PCR Screening     Status: None   Collection Time: 04/30/17  1:00 AM  Result Value Ref Range Status   MRSA by PCR NEGATIVE NEGATIVE Final    Comment:        The GeneXpert MRSA Assay (FDA approved for NASAL specimens only), is one component of a comprehensive MRSA colonization surveillance program. It is not intended to diagnose MRSA infection nor to guide or monitor treatment for MRSA infections.   C difficile quick scan w PCR reflex     Status: None   Collection Time: 04/30/17  4:55 PM  Result Value Ref Range Status   C Diff antigen NEGATIVE NEGATIVE Final   C Diff toxin NEGATIVE NEGATIVE Final   C Diff interpretation No C. difficile detected.  Final   Studies/Results: No results found.  Medications:  Prior to Admission:  Prescriptions Prior to Admission  Medication Sig Dispense Refill Last Dose  . acetaminophen (TYLENOL) 325 MG tablet Take 2 tablets (650 mg total) by mouth every 6 (six) hours as needed for mild pain (or Fever >/=  101).   Past Week  at Unknown time  . albuterol (PROVENTIL HFA;VENTOLIN HFA) 108 (90 Base) MCG/ACT inhaler Inhale 2 puffs into the lungs 4 (four) times daily.    04/29/2017 at Unknown time  . apixaban (ELIQUIS) 5 MG TABS tablet Take 1 tablet (5 mg total) by mouth 2 (two) times daily. 60 tablet 5 04/29/2017 at 1030-1100a  . calcium carbonate (TUMS - DOSED IN MG ELEMENTAL CALCIUM) 500 MG chewable tablet Chew 1 tablet by mouth 2 (two) times daily.   04/29/2017 at Unknown time  . carboxymethylcellulose (REFRESH PLUS) 0.5 % SOLN Apply 1 drop to eye daily as needed (for dry eyes).   Past Week at Unknown time  . cetirizine (ZYRTEC) 10 MG tablet Take 10 mg by mouth daily.   04/29/2017 at Unknown time  . cholecalciferol (VITAMIN D) 1000 units tablet Take 1,000 Units by mouth every morning.   04/29/2017 at Unknown time  . colchicine 0.6 MG tablet Take 1 tablet (0.6 mg total) by mouth daily. 30 tablet 0 04/29/2017 at Unknown time  . diltiazem (CARDIZEM CD) 120 MG 24 hr capsule Take 1 capsule (120 mg total) by mouth daily. 90 capsule 3 04/29/2017 at Unknown time  . escitalopram (LEXAPRO) 20 MG tablet Take 1 tablet (20 mg total) by mouth daily. 30 tablet 12 04/29/2017 at Unknown time  . fluticasone furoate-vilanterol (BREO ELLIPTA) 200-25 MCG/INH AEPB Inhale 1 puff into the lungs daily.   04/29/2017 at Unknown time  . furosemide (LASIX) 20 MG tablet Take 1 tablet (20 mg total) by mouth daily. 30 tablet 0 04/29/2017 at Unknown time  . gabapentin (NEURONTIN) 100 MG capsule Take 2 capsules (200 mg total) by mouth at bedtime. 30 capsule 0 04/28/2017 at Unknown time  . levothyroxine (SYNTHROID, LEVOTHROID) 137 MCG tablet Take 137 mcg by mouth daily before breakfast.   04/29/2017 at Unknown time  . loperamide (IMODIUM) 2 MG capsule Take 1 capsule (2 mg total) by mouth every morning. (Patient taking differently: Take 2 mg by mouth every morning. *May take up to 4 times daily as needed for loose stools and diarrhea) 90 capsule 5 Past Week at  Unknown time  . metoprolol tartrate (LOPRESSOR) 25 MG tablet TAKE 1/2 TABLET BY MOUTH TWICE DAILY 90 tablet 1 04/29/2017 at 1030-1100a  . omeprazole (PRILOSEC) 20 MG capsule TAKE ONE CAPSULE BY MOUTH DAILY 90 capsule 3 04/29/2017 at Unknown time  . Pediatric Multivitamins-Iron (FLINTSTONES PLUS IRON) chewable tablet Chew 1 tablet by mouth 2 (two) times daily.   04/29/2017 at Unknown time  . potassium chloride SA (K-DUR,KLOR-CON) 20 MEQ tablet Take 1 tablet (20 mEq total) by mouth daily. 30 tablet 0 04/29/2017 at Unknown time  . sodium bicarbonate 650 MG tablet Take 1 tablet (650 mg total) by mouth 2 (two) times daily. 60 tablet 0 04/29/2017 at Unknown time  . triamcinolone (NASACORT ALLERGY 24HR) 55 MCG/ACT AERO nasal inhaler Place 2 sprays into the nose daily.   unknown  . vancomycin (VANCOCIN) 50 mg/mL oral solution Take 2.5 mLs (125 mg total) by mouth 2 (two) times daily. 70 mL 0 04/28/2017 at Unknown time  . allopurinol (ZYLOPRIM) 100 MG tablet Take 150 mg by mouth daily.  0   . Amino Acids-Protein Hydrolys (FEEDING SUPPLEMENT, PRO-STAT SUGAR FREE 64,) LIQD Take 30 mLs by mouth 2 (two) times daily. (Patient not taking: Reported on 04/29/2017) 900 mL 0 Not Taking at Unknown time  . Doxepin HCl (SILENOR) 6 MG TABS Take 1 tablet (6 mg total) by mouth at bedtime as needed (  for sleep). (Patient not taking: Reported on 04/29/2017) 10 tablet 0 Not Taking at Unknown time  . insulin glargine (LANTUS) 100 UNIT/ML injection Inject 0.2 mLs (20 Units total) into the skin daily. (Patient not taking: Reported on 04/29/2017) 10 mL 11 Not Taking at Unknown time  . ondansetron (ZOFRAN-ODT) 4 MG disintegrating tablet Take 1 tablet (4 mg total) by mouth 3 (three) times daily before meals. (Patient not taking: Reported on 04/29/2017) 20 tablet 0 Not Taking at Unknown time   Scheduled: . albuterol  3 mL Inhalation TID  . allopurinol  150 mg Oral Daily  . apixaban  5 mg Oral BID  . calcium carbonate  1 tablet Oral BID  .  cholecalciferol  1,000 Units Oral q morning - 10a  . colchicine  0.6 mg Oral Daily  . diltiazem  120 mg Oral Daily  . escitalopram  20 mg Oral Daily  . feeding supplement  1 Container Oral TID BM  . feeding supplement (PRO-STAT SUGAR FREE 64)  30 mL Oral BID  . fluticasone  2 spray Each Nare Daily  . fluticasone furoate-vilanterol  1 puff Inhalation Daily  . furosemide  40 mg Intravenous Q12H  . gabapentin  200 mg Oral QHS  . insulin aspart  0-9 Units Subcutaneous TID WC  . levothyroxine  137 mcg Oral QAC breakfast  . metoprolol tartrate  12.5 mg Oral BID  . multivitamin with minerals  1 tablet Oral BID  . potassium chloride SA  20 mEq Oral Daily  . sodium bicarbonate  650 mg Oral BID  . sodium chloride flush  3 mL Intravenous Q12H   Continuous: . sodium chloride    . doxycycline (VIBRAMYCIN) IV 100 mg (05/04/17 0847)   LMB:EMLJQG chloride, acetaminophen **OR** acetaminophen, acetaminophen, ondansetron (ZOFRAN) IV, polyvinyl alcohol, sodium chloride flush  Assesment: She was admitted with cellulitis of the abdominal wall. I think she has some cellulitis of the left arm as well. I'm going to have her get a Doppler of the left arm just to be sure there were not dealing with a blood clot but she is on full anticoagulation so I think that's less likely. She has nutritional problems and has generalized puffiness. At baseline she has COPD which I think is stable, chronic diastolic heart failure which is stable episodes of acute kidney injury usually associated with acute heart failure requiring diuresis, paroxysmal atrial fibrillation, chronic anticoagulation anxiety and I think she's having some more trouble with that and depression probably with a little more trouble with that as well Principal Problem:   Cellulitis Active Problems:   HTN (hypertension)   COPD (chronic obstructive pulmonary disease) (HCC)   PAF (paroxysmal atrial fibrillation) (HCC)   Chronic anticoagulation   Chronic  edema   Obesity   Diabetes mellitus type 2 in obese (HCC)   Chronic diastolic CHF (congestive heart failure) ([place])    Plan: Continue treatments. Venous doppler of her arm    LOS: 5 days   Toluwani Ruder L 05/04/2017, 10:05 AM

## 2017-05-05 LAB — GLUCOSE, CAPILLARY
GLUCOSE-CAPILLARY: 97 mg/dL (ref 65–99)
Glucose-Capillary: 110 mg/dL — ABNORMAL HIGH (ref 65–99)
Glucose-Capillary: 101 mg/dL — ABNORMAL HIGH (ref 65–99)
Glucose-Capillary: 79 mg/dL (ref 65–99)
Glucose-Capillary: 126 mg/dL — ABNORMAL HIGH (ref 65–99)

## 2017-05-05 LAB — CBC WITH DIFFERENTIAL/PLATELET
Basophils Absolute: 0 10*3/uL (ref 0.0–0.1)
Basophils Relative: 0 %
Eosinophils Absolute: 0.3 10*3/uL (ref 0.0–0.7)
Eosinophils Relative: 4 %
HEMATOCRIT: 26 % — AB (ref 36.0–46.0)
Hemoglobin: 8.5 g/dL — ABNORMAL LOW (ref 12.0–15.0)
LYMPHS ABS: 1.8 10*3/uL (ref 0.7–4.0)
LYMPHS PCT: 22 %
MCH: 32.1 pg (ref 26.0–34.0)
MCHC: 32.7 g/dL (ref 30.0–36.0)
MCV: 98.1 fL (ref 78.0–100.0)
Monocytes Absolute: 0.7 10*3/uL (ref 0.1–1.0)
Monocytes Relative: 9 %
Neutro Abs: 5.3 10*3/uL (ref 1.7–7.7)
Neutrophils Relative %: 65 %
Platelets: 190 10*3/uL (ref 150–400)
RBC: 2.65 MIL/uL — ABNORMAL LOW (ref 3.87–5.11)
RDW: 18.1 % — AB (ref 11.5–15.5)
WBC: 8.2 10*3/uL (ref 4.0–10.5)

## 2017-05-05 LAB — BASIC METABOLIC PANEL
Anion gap: 11 (ref 5–15)
BUN: 13 mg/dL (ref 6–20)
CHLORIDE: 89 mmol/L — AB (ref 101–111)
CO2: 32 mmol/L (ref 22–32)
Calcium: 7.5 mg/dL — ABNORMAL LOW (ref 8.9–10.3)
Creatinine, Ser: 1.96 mg/dL — ABNORMAL HIGH (ref 0.44–1.00)
GFR calc Af Amer: 28 mL/min — ABNORMAL LOW (ref >60)
GFR calc non Af Amer: 24 mL/min — ABNORMAL LOW (ref >60)
GLUCOSE: 83 mg/dL (ref 65–99)
POTASSIUM: 3.6 mmol/L (ref 3.5–5.1)
Sodium: 132 mmol/L — ABNORMAL LOW (ref 135–145)

## 2017-05-05 MED ORDER — COLCHICINE 0.6 MG PO TABS
0.6000 mg | ORAL_TABLET | ORAL | Status: DC
  Administered 2017-05-06 – 2017-05-12 (×4): 0.6 mg via ORAL
  Filled 2017-05-05 (×8): qty 1

## 2017-05-05 NOTE — Progress Notes (Signed)
Subjective: She says she feels better. Her arm looks better. No new complaints. Her renal function is not as good so on going to have to discontinue diuresis. She has been getting up in a chair.  Objective: Vital signs in last 24 hours: Temp:  [98.3 F (36.8 C)-98.4 F (36.9 C)] 98.4 F (36.9 C) (08/12 0448) Pulse Rate:  [77-93] 92 (08/12 0448) Resp:  [18-20] 18 (08/12 0448) BP: (96-108)/(38-48) 108/44 (08/12 0448) SpO2:  [85 %-97 %] 93 % (08/12 0746) Weight change:  Last BM Date: 05/04/17  Intake/Output from previous day: 08/11 0701 - 08/12 0700 In: 2560 [P.O.:560; IV Piggyback:2000] Out: 800 [Urine:800]  PHYSICAL EXAM General appearance: alert, cooperative and mild distress Resp: clear to auscultation bilaterally Cardio: regular rate and rhythm, S1, S2 normal, no murmur, click, rub or gallop GI: She still has inflammatory changes in the right lateral part of her abdominal wall but is better Extremities: Her left arm looks better Mucous membranes are moist  Lab Results:  Results for orders placed or performed during the hospital encounter of 04/29/17 (from the past 48 hour(s))  Glucose, capillary     Status: Abnormal   Collection Time: 05/03/17 11:30 AM  Result Value Ref Range   Glucose-Capillary 135 (H) 65 - 99 mg/dL  Glucose, capillary     Status: None   Collection Time: 05/03/17  4:20 PM  Result Value Ref Range   Glucose-Capillary 97 65 - 99 mg/dL  Glucose, capillary     Status: Abnormal   Collection Time: 05/03/17  8:55 PM  Result Value Ref Range   Glucose-Capillary 122 (H) 65 - 99 mg/dL   Comment 1 Notify RN    Comment 2 Document in Chart   Glucose, capillary     Status: None   Collection Time: 05/04/17  7:45 AM  Result Value Ref Range   Glucose-Capillary 86 65 - 99 mg/dL  Basic metabolic panel     Status: Abnormal   Collection Time: 05/04/17 11:24 AM  Result Value Ref Range   Sodium 134 (L) 135 - 145 mmol/L   Potassium 3.6 3.5 - 5.1 mmol/L   Chloride 91  (L) 101 - 111 mmol/L   CO2 32 22 - 32 mmol/L   Glucose, Bld 103 (H) 65 - 99 mg/dL   BUN 12 6 - 20 mg/dL   Creatinine, Ser 1.64 (H) 0.44 - 1.00 mg/dL   Calcium 7.5 (L) 8.9 - 10.3 mg/dL   GFR calc non Af Amer 30 (L) >60 mL/min   GFR calc Af Amer 34 (L) >60 mL/min    Comment: (NOTE) The eGFR has been calculated using the CKD EPI equation. This calculation has not been validated in all clinical situations. eGFR's persistently <60 mL/min signify possible Chronic Kidney Disease.    Anion gap 11 5 - 15  Glucose, capillary     Status: Abnormal   Collection Time: 05/04/17 11:30 AM  Result Value Ref Range   Glucose-Capillary 106 (H) 65 - 99 mg/dL  Glucose, capillary     Status: Abnormal   Collection Time: 05/04/17  4:59 PM  Result Value Ref Range   Glucose-Capillary 100 (H) 65 - 99 mg/dL  Glucose, capillary     Status: Abnormal   Collection Time: 05/04/17  9:15 PM  Result Value Ref Range   Glucose-Capillary 119 (H) 65 - 99 mg/dL   Comment 1 Notify RN    Comment 2 Document in Chart   Basic metabolic panel     Status: Abnormal  Collection Time: 05/05/17  5:50 AM  Result Value Ref Range   Sodium 132 (L) 135 - 145 mmol/L   Potassium 3.6 3.5 - 5.1 mmol/L   Chloride 89 (L) 101 - 111 mmol/L   CO2 32 22 - 32 mmol/L   Glucose, Bld 83 65 - 99 mg/dL   BUN 13 6 - 20 mg/dL   Creatinine, Ser 1.96 (H) 0.44 - 1.00 mg/dL   Calcium 7.5 (L) 8.9 - 10.3 mg/dL   GFR calc non Af Amer 24 (L) >60 mL/min   GFR calc Af Amer 28 (L) >60 mL/min    Comment: (NOTE) The eGFR has been calculated using the CKD EPI equation. This calculation has not been validated in all clinical situations. eGFR's persistently <60 mL/min signify possible Chronic Kidney Disease.    Anion gap 11 5 - 15  CBC with Differential/Platelet     Status: Abnormal   Collection Time: 05/05/17  5:50 AM  Result Value Ref Range   WBC 8.2 4.0 - 10.5 K/uL   RBC 2.65 (L) 3.87 - 5.11 MIL/uL   Hemoglobin 8.5 (L) 12.0 - 15.0 g/dL   HCT 26.0  (L) 36.0 - 46.0 %   MCV 98.1 78.0 - 100.0 fL   MCH 32.1 26.0 - 34.0 pg   MCHC 32.7 30.0 - 36.0 g/dL   RDW 18.1 (H) 11.5 - 15.5 %   Platelets 190 150 - 400 K/uL   Neutrophils Relative % 65 %   Neutro Abs 5.3 1.7 - 7.7 K/uL   Lymphocytes Relative 22 %   Lymphs Abs 1.8 0.7 - 4.0 K/uL   Monocytes Relative 9 %   Monocytes Absolute 0.7 0.1 - 1.0 K/uL   Eosinophils Relative 4 %   Eosinophils Absolute 0.3 0.0 - 0.7 K/uL   Basophils Relative 0 %   Basophils Absolute 0.0 0.0 - 0.1 K/uL  Glucose, capillary     Status: Abnormal   Collection Time: 05/05/17  7:36 AM  Result Value Ref Range   Glucose-Capillary 110 (H) 65 - 99 mg/dL   Comment 1 Notify RN    Comment 2 Document in Chart     ABGS No results for input(s): PHART, PO2ART, TCO2, HCO3 in the last 72 hours.  Invalid input(s): PCO2 CULTURES Recent Results (from the past 240 hour(s))  Culture, blood (Routine X 2) w Reflex to ID Panel     Status: None   Collection Time: 04/29/17  8:18 PM  Result Value Ref Range Status   Specimen Description BLOOD RIGHT ARM  Final   Special Requests   Final    BOTTLES DRAWN AEROBIC AND ANAEROBIC Blood Culture adequate volume   Culture NO GROWTH 5 DAYS  Final   Report Status 05/04/2017 FINAL  Final  Culture, blood (Routine X 2) w Reflex to ID Panel     Status: None   Collection Time: 04/29/17  8:26 PM  Result Value Ref Range Status   Specimen Description BLOOD RIGHT ARM  Final   Special Requests   Final    BOTTLES DRAWN AEROBIC AND ANAEROBIC Blood Culture adequate volume   Culture NO GROWTH 5 DAYS  Final   Report Status 05/04/2017 FINAL  Final  MRSA PCR Screening     Status: None   Collection Time: 04/30/17  1:00 AM  Result Value Ref Range Status   MRSA by PCR NEGATIVE NEGATIVE Final    Comment:        The GeneXpert MRSA Assay (FDA approved for NASAL specimens  only), is one component of a comprehensive MRSA colonization surveillance program. It is not intended to diagnose MRSA infection  nor to guide or monitor treatment for MRSA infections.   C difficile quick scan w PCR reflex     Status: None   Collection Time: 04/30/17  4:55 PM  Result Value Ref Range Status   C Diff antigen NEGATIVE NEGATIVE Final   C Diff toxin NEGATIVE NEGATIVE Final   C Diff interpretation No C. difficile detected.  Final   Studies/Results: No results found.  Medications:  Prior to Admission:  Prescriptions Prior to Admission  Medication Sig Dispense Refill Last Dose  . acetaminophen (TYLENOL) 325 MG tablet Take 2 tablets (650 mg total) by mouth every 6 (six) hours as needed for mild pain (or Fever >/= 101).   Past Week at Unknown time  . albuterol (PROVENTIL HFA;VENTOLIN HFA) 108 (90 Base) MCG/ACT inhaler Inhale 2 puffs into the lungs 4 (four) times daily.    04/29/2017 at Unknown time  . apixaban (ELIQUIS) 5 MG TABS tablet Take 1 tablet (5 mg total) by mouth 2 (two) times daily. 60 tablet 5 04/29/2017 at 1030-1100a  . calcium carbonate (TUMS - DOSED IN MG ELEMENTAL CALCIUM) 500 MG chewable tablet Chew 1 tablet by mouth 2 (two) times daily.   04/29/2017 at Unknown time  . carboxymethylcellulose (REFRESH PLUS) 0.5 % SOLN Apply 1 drop to eye daily as needed (for dry eyes).   Past Week at Unknown time  . cetirizine (ZYRTEC) 10 MG tablet Take 10 mg by mouth daily.   04/29/2017 at Unknown time  . cholecalciferol (VITAMIN D) 1000 units tablet Take 1,000 Units by mouth every morning.   04/29/2017 at Unknown time  . colchicine 0.6 MG tablet Take 1 tablet (0.6 mg total) by mouth daily. 30 tablet 0 04/29/2017 at Unknown time  . diltiazem (CARDIZEM CD) 120 MG 24 hr capsule Take 1 capsule (120 mg total) by mouth daily. 90 capsule 3 04/29/2017 at Unknown time  . escitalopram (LEXAPRO) 20 MG tablet Take 1 tablet (20 mg total) by mouth daily. 30 tablet 12 04/29/2017 at Unknown time  . fluticasone furoate-vilanterol (BREO ELLIPTA) 200-25 MCG/INH AEPB Inhale 1 puff into the lungs daily.   04/29/2017 at Unknown time  . furosemide  (LASIX) 20 MG tablet Take 1 tablet (20 mg total) by mouth daily. 30 tablet 0 04/29/2017 at Unknown time  . gabapentin (NEURONTIN) 100 MG capsule Take 2 capsules (200 mg total) by mouth at bedtime. 30 capsule 0 04/28/2017 at Unknown time  . levothyroxine (SYNTHROID, LEVOTHROID) 137 MCG tablet Take 137 mcg by mouth daily before breakfast.   04/29/2017 at Unknown time  . loperamide (IMODIUM) 2 MG capsule Take 1 capsule (2 mg total) by mouth every morning. (Patient taking differently: Take 2 mg by mouth every morning. *May take up to 4 times daily as needed for loose stools and diarrhea) 90 capsule 5 Past Week at Unknown time  . metoprolol tartrate (LOPRESSOR) 25 MG tablet TAKE 1/2 TABLET BY MOUTH TWICE DAILY 90 tablet 1 04/29/2017 at 1030-1100a  . omeprazole (PRILOSEC) 20 MG capsule TAKE ONE CAPSULE BY MOUTH DAILY 90 capsule 3 04/29/2017 at Unknown time  . Pediatric Multivitamins-Iron (FLINTSTONES PLUS IRON) chewable tablet Chew 1 tablet by mouth 2 (two) times daily.   04/29/2017 at Unknown time  . potassium chloride SA (K-DUR,KLOR-CON) 20 MEQ tablet Take 1 tablet (20 mEq total) by mouth daily. 30 tablet 0 04/29/2017 at Unknown time  . sodium bicarbonate  650 MG tablet Take 1 tablet (650 mg total) by mouth 2 (two) times daily. 60 tablet 0 04/29/2017 at Unknown time  . triamcinolone (NASACORT ALLERGY 24HR) 55 MCG/ACT AERO nasal inhaler Place 2 sprays into the nose daily.   unknown  . vancomycin (VANCOCIN) 50 mg/mL oral solution Take 2.5 mLs (125 mg total) by mouth 2 (two) times daily. 70 mL 0 04/28/2017 at Unknown time  . allopurinol (ZYLOPRIM) 100 MG tablet Take 150 mg by mouth daily.  0   . Amino Acids-Protein Hydrolys (FEEDING SUPPLEMENT, PRO-STAT SUGAR FREE 64,) LIQD Take 30 mLs by mouth 2 (two) times daily. (Patient not taking: Reported on 04/29/2017) 900 mL 0 Not Taking at Unknown time  . Doxepin HCl (SILENOR) 6 MG TABS Take 1 tablet (6 mg total) by mouth at bedtime as needed (for sleep). (Patient not taking: Reported on  04/29/2017) 10 tablet 0 Not Taking at Unknown time  . insulin glargine (LANTUS) 100 UNIT/ML injection Inject 0.2 mLs (20 Units total) into the skin daily. (Patient not taking: Reported on 04/29/2017) 10 mL 11 Not Taking at Unknown time  . ondansetron (ZOFRAN-ODT) 4 MG disintegrating tablet Take 1 tablet (4 mg total) by mouth 3 (three) times daily before meals. (Patient not taking: Reported on 04/29/2017) 20 tablet 0 Not Taking at Unknown time   Scheduled: . albuterol  3 mL Inhalation TID  . allopurinol  150 mg Oral Daily  . apixaban  5 mg Oral BID  . calcium carbonate  1 tablet Oral BID  . cholecalciferol  1,000 Units Oral q morning - 10a  . [START ON 05/06/2017] colchicine  0.6 mg Oral Q48H  . diltiazem  120 mg Oral Daily  . escitalopram  20 mg Oral Daily  . feeding supplement  1 Container Oral TID BM  . feeding supplement (PRO-STAT SUGAR FREE 64)  30 mL Oral BID  . fluticasone  2 spray Each Nare Daily  . fluticasone furoate-vilanterol  1 puff Inhalation Daily  . gabapentin  200 mg Oral QHS  . insulin aspart  0-9 Units Subcutaneous TID WC  . levothyroxine  137 mcg Oral QAC breakfast  . metoprolol tartrate  12.5 mg Oral BID  . multivitamin with minerals  1 tablet Oral BID  . sodium bicarbonate  650 mg Oral BID  . sodium chloride flush  3 mL Intravenous Q12H   Continuous: . sodium chloride    . doxycycline (VIBRAMYCIN) IV 100 mg (05/05/17 0816)   CBS:WHQPRF chloride, acetaminophen **OR** acetaminophen, acetaminophen, ondansetron (ZOFRAN) IV, polyvinyl alcohol, sodium chloride flush  Assesment: She has cellulitis of the abdominal wall and probably of her left arm. She has multiple other medical problems. She has chronic diastolic heart failure but her renal function is not as good. This is been a chronic issue with increased creatinine when we try to diurese her. She does have chronic edema. She has COPD at baseline. She has diabetes which is pretty well controlled. Principal Problem:    Cellulitis Active Problems:   HTN (hypertension)   COPD (chronic obstructive pulmonary disease) (HCC)   PAF (paroxysmal atrial fibrillation) (HCC)   Chronic anticoagulation   Chronic edema   Obesity   Diabetes mellitus type 2 in obese (HCC)   Chronic diastolic CHF (congestive heart failure) ([place])    Plan: Continue current medications. Continue treatments.    LOS: 6 days   Airika Alkhatib L 05/05/2017, 10:09 AM

## 2017-05-05 NOTE — Progress Notes (Signed)
Pt states she "feels the urge to urinate a little." Pt bladder scanned=[id number] in bladder. Pt has not had much output this shift after receiving Lasix. Will continue to monitor pt.

## 2017-05-05 NOTE — [place] Notes (Signed)
Pts O2 sats decreased to mid 80's on RA, placed back on 1L O2 per [facility], and 02 sats increased to 95%. RT made aware. Will continue to monitor pt

## 2017-05-05 NOTE — Progress Notes (Addendum)
Pt has had no urinary output since receiving Lasix last night. Pt bladder scanned, and 50mL seen in bladder, will pass on to daytime RN.

## 2017-05-06 LAB — GLUCOSE, CAPILLARY
GLUCOSE-CAPILLARY: 85 mg/dL (ref 65–99)
Glucose-Capillary: 85 mg/dL (ref 65–99)
Glucose-Capillary: 105 mg/dL — ABNORMAL HIGH (ref 65–99)
Glucose-Capillary: 98 mg/dL (ref 65–99)

## 2017-05-06 LAB — BASIC METABOLIC PANEL
Anion gap: 10 (ref 5–15)
BUN: 15 mg/dL (ref 6–20)
CALCIUM: 7.3 mg/dL — AB (ref 8.9–10.3)
CO2: 32 mmol/L (ref 22–32)
CREATININE: 2.23 mg/dL — AB (ref 0.44–1.00)
Chloride: 89 mmol/L — ABNORMAL LOW (ref 101–111)
GFR calc Af Amer: 24 mL/min — ABNORMAL LOW (ref >60)
GFR, EST NON AFRICAN AMERICAN: 21 mL/min — AB (ref >60)
GLUCOSE: 91 mg/dL (ref 65–99)
Potassium: 3.8 mmol/L (ref 3.5–5.1)
SODIUM: 131 mmol/L — AB (ref 135–145)

## 2017-05-06 MED ORDER — ONDANSETRON HCL 4 MG PO TABS
4.0000 mg | ORAL_TABLET | Freq: Three times a day (TID) | ORAL | Status: DC
  Administered 2017-05-06 – 2017-05-09 (×11): 4 mg via ORAL
  Filled 2017-05-06 (×11): qty 1

## 2017-05-06 MED ORDER — DOXYCYCLINE HYCLATE 100 MG PO TABS
100.0000 mg | ORAL_TABLET | Freq: Two times a day (BID) | ORAL | Status: DC
  Administered 2017-05-06 – 2017-05-13 (×14): 100 mg via ORAL
  Filled 2017-05-06 (×14): qty 1

## 2017-05-06 NOTE — [place] Notes (Signed)
Subjective: She doesn't feel well today. She's complaining of "heaving" when she tries to eat. This is been a recurrent problem. It may be related to her elevated creatinine as it does seem to correlate in the past. She has no other new complaints. Her cellulitis seems to be improving.  Objective: Vital signs in last 24 hours: Temp:  [98 F (36.7 C)-98.2 F (36.8 C)] 98.2 F (36.8 C) (08/12 2220) Pulse Rate:  [78-91] 91 (08/12 2220) Resp:  [18-20] 20 (08/12 2220) BP: (92-108)/(36-52) 108/52 (08/13 0000) SpO2:  [94 %-100 %] 100 % (08/13 0843) Weight change:  Last BM Date: 05/05/17  Intake/Output from previous day: 08/12 0701 - 08/13 0700 In: 1150 [P.O.:900; IV Piggyback:250] Out: 200 [Urine:200]  PHYSICAL EXAM General appearance: alert, cooperative and mild distress Resp: rales bilaterally Cardio: regular rate and rhythm, S1, S2 normal, no murmur, click, rub or gallop GI: She still has some swelling and erythema of the skin of her abdomen but its overall better no tenderness Extremities: The left arm looks better She looks chronically sick  Lab Results:  Results for orders placed or performed during the hospital encounter of 04/29/17 (from the past 48 hour(s))  Basic metabolic panel     Status: Abnormal   Collection Time: 05/04/17 11:24 AM  Result Value Ref Range   Sodium 134 (L) 135 - 145 mmol/L   Potassium 3.6 3.5 - 5.1 mmol/L   Chloride 91 (L) 101 - 111 mmol/L   CO2 32 22 - 32 mmol/L   Glucose, Bld 103 (H) 65 - 99 mg/dL   BUN 12 6 - 20 mg/dL   Creatinine, Ser 1.64 (H) 0.44 - 1.00 mg/dL   Calcium 7.5 (L) 8.9 - 10.3 mg/dL   GFR calc non Af Amer 30 (L) >60 mL/min   GFR calc Af Amer 34 (L) >60 mL/min    Comment: (NOTE) The eGFR has been calculated using the CKD EPI equation. This calculation has not been validated in all clinical situations. eGFR's persistently <60 mL/min signify possible Chronic Kidney Disease.    Anion gap 11 5 - 15  Glucose, capillary     Status:  Abnormal   Collection Time: 05/04/17 11:30 AM  Result Value Ref Range   Glucose-Capillary 106 (H) 65 - 99 mg/dL  Glucose, capillary     Status: Abnormal   Collection Time: 05/04/17  4:59 PM  Result Value Ref Range   Glucose-Capillary 100 (H) 65 - 99 mg/dL  Glucose, capillary     Status: Abnormal   Collection Time: 05/04/17  9:15 PM  Result Value Ref Range   Glucose-Capillary 119 (H) 65 - 99 mg/dL   Comment 1 Notify RN    Comment 2 Document in Chart   Basic metabolic panel     Status: Abnormal   Collection Time: 05/05/17  5:50 AM  Result Value Ref Range   Sodium 132 (L) 135 - 145 mmol/L   Potassium 3.6 3.5 - 5.1 mmol/L   Chloride 89 (L) 101 - 111 mmol/L   CO2 32 22 - 32 mmol/L   Glucose, Bld 83 65 - 99 mg/dL   BUN 13 6 - 20 mg/dL   Creatinine, Ser 1.96 (H) 0.44 - 1.00 mg/dL   Calcium 7.5 (L) 8.9 - 10.3 mg/dL   GFR calc non Af Amer 24 (L) >60 mL/min   GFR calc Af Amer 28 (L) >60 mL/min    Comment: (NOTE) The eGFR has been calculated using the CKD EPI equation. This calculation has  not been validated in all clinical situations. eGFR's persistently <60 mL/min signify possible Chronic Kidney Disease.    Anion gap 11 5 - 15  CBC with Differential/Platelet     Status: Abnormal   Collection Time: 05/05/17  5:50 AM  Result Value Ref Range   WBC 8.2 4.0 - 10.5 K/uL   RBC 2.65 (L) 3.87 - 5.11 MIL/uL   Hemoglobin 8.5 (L) 12.0 - 15.0 g/dL   HCT 26.0 (L) 36.0 - 46.0 %   MCV 98.1 78.0 - 100.0 fL   MCH 32.1 26.0 - 34.0 pg   MCHC 32.7 30.0 - 36.0 g/dL   RDW 18.1 (H) 11.5 - 15.5 %   Platelets 190 150 - 400 K/uL   Neutrophils Relative % 65 %   Neutro Abs 5.3 1.7 - 7.7 K/uL   Lymphocytes Relative 22 %   Lymphs Abs 1.8 0.7 - 4.0 K/uL   Monocytes Relative 9 %   Monocytes Absolute 0.7 0.1 - 1.0 K/uL   Eosinophils Relative 4 %   Eosinophils Absolute 0.3 0.0 - 0.7 K/uL   Basophils Relative 0 %   Basophils Absolute 0.0 0.0 - 0.1 K/uL  Glucose, capillary     Status: Abnormal    Collection Time: 05/05/17  7:36 AM  Result Value Ref Range   Glucose-Capillary 110 (H) 65 - 99 mg/dL   Comment 1 Notify RN    Comment 2 Document in Chart   Glucose, capillary     Status: Abnormal   Collection Time: 05/05/17 11:30 AM  Result Value Ref Range   Glucose-Capillary 101 (H) 65 - 99 mg/dL   Comment 1 Notify RN    Comment 2 Document in Chart   Glucose, capillary     Status: None   Collection Time: 05/05/17  4:37 PM  Result Value Ref Range   Glucose-Capillary 97 65 - 99 mg/dL   Comment 1 Notify RN    Comment 2 Document in Chart   Glucose, capillary     Status: None   Collection Time: 05/05/17  9:51 PM  Result Value Ref Range   Glucose-Capillary 79 65 - 99 mg/dL   Comment 1 Notify RN    Comment 2 Document in Chart   Glucose, capillary     Status: Abnormal   Collection Time: 05/05/17 10:40 PM  Result Value Ref Range   Glucose-Capillary 126 (H) 65 - 99 mg/dL   Comment 1 Notify RN    Comment 2 Document in Chart   Basic metabolic panel     Status: Abnormal   Collection Time: 05/06/17  5:45 AM  Result Value Ref Range   Sodium 131 (L) 135 - 145 mmol/L   Potassium 3.8 3.5 - 5.1 mmol/L   Chloride 89 (L) 101 - 111 mmol/L   CO2 32 22 - 32 mmol/L   Glucose, Bld 91 65 - 99 mg/dL   BUN 15 6 - 20 mg/dL   Creatinine, Ser 2.23 (H) 0.44 - 1.00 mg/dL   Calcium 7.3 (L) 8.9 - 10.3 mg/dL   GFR calc non Af Amer 21 (L) >60 mL/min   GFR calc Af Amer 24 (L) >60 mL/min    Comment: (NOTE) The eGFR has been calculated using the CKD EPI equation. This calculation has not been validated in all clinical situations. eGFR's persistently <60 mL/min signify possible Chronic Kidney Disease.    Anion gap 10 5 - 15  Glucose, capillary     Status: None   Collection Time: 05/06/17  8:15  AM  Result Value Ref Range   Glucose-Capillary 85 65 - 99 mg/dL    ABGS No results for input(s): PHART, PO2ART, TCO2, HCO3 in the last 72 hours.  Invalid input(s): PCO2 CULTURES Recent Results (from the past  240 hour(s))  Culture, blood (Routine X 2) w Reflex to ID Panel     Status: None   Collection Time: 04/29/17  8:18 PM  Result Value Ref Range Status   Specimen Description BLOOD RIGHT ARM  Final   Special Requests   Final    BOTTLES DRAWN AEROBIC AND ANAEROBIC Blood Culture adequate volume   Culture NO GROWTH 5 DAYS  Final   Report Status 05/04/2017 FINAL  Final  Culture, blood (Routine X 2) w Reflex to ID Panel     Status: None   Collection Time: 04/29/17  8:26 PM  Result Value Ref Range Status   Specimen Description BLOOD RIGHT ARM  Final   Special Requests   Final    BOTTLES DRAWN AEROBIC AND ANAEROBIC Blood Culture adequate volume   Culture NO GROWTH 5 DAYS  Final   Report Status 05/04/2017 FINAL  Final  MRSA PCR Screening     Status: None   Collection Time: 04/30/17  1:00 AM  Result Value Ref Range Status   MRSA by PCR NEGATIVE NEGATIVE Final    Comment:        The GeneXpert MRSA Assay (FDA approved for NASAL specimens only), is one component of a comprehensive MRSA colonization surveillance program. It is not intended to diagnose MRSA infection nor to guide or monitor treatment for MRSA infections.   C difficile quick scan w PCR reflex     Status: None   Collection Time: 04/30/17  4:55 PM  Result Value Ref Range Status   C Diff antigen NEGATIVE NEGATIVE Final   C Diff toxin NEGATIVE NEGATIVE Final   C Diff interpretation No C. difficile detected.  Final   Studies/Results: No results found.  Medications:  Prior to Admission:  Prescriptions Prior to Admission  Medication Sig Dispense Refill Last Dose  . acetaminophen (TYLENOL) 325 MG tablet Take 2 tablets (650 mg total) by mouth every 6 (six) hours as needed for mild pain (or Fever >/= 101).   Past Week at Unknown time  . albuterol (PROVENTIL HFA;VENTOLIN HFA) 108 (90 Base) MCG/ACT inhaler Inhale 2 puffs into the lungs 4 (four) times daily.    04/29/2017 at Unknown time  . apixaban (ELIQUIS) 5 MG TABS tablet Take 1  tablet (5 mg total) by mouth 2 (two) times daily. 60 tablet 5 04/29/2017 at 1030-1100a  . calcium carbonate (TUMS - DOSED IN MG ELEMENTAL CALCIUM) 500 MG chewable tablet Chew 1 tablet by mouth 2 (two) times daily.   04/29/2017 at Unknown time  . carboxymethylcellulose (REFRESH PLUS) 0.5 % SOLN Apply 1 drop to eye daily as needed (for dry eyes).   Past Week at Unknown time  . cetirizine (ZYRTEC) 10 MG tablet Take 10 mg by mouth daily.   04/29/2017 at Unknown time  . cholecalciferol (VITAMIN D) 1000 units tablet Take 1,000 Units by mouth every morning.   04/29/2017 at Unknown time  . colchicine 0.6 MG tablet Take 1 tablet (0.6 mg total) by mouth daily. 30 tablet 0 04/29/2017 at Unknown time  . diltiazem (CARDIZEM CD) 120 MG 24 hr capsule Take 1 capsule (120 mg total) by mouth daily. 90 capsule 3 04/29/2017 at Unknown time  . escitalopram (LEXAPRO) 20 MG tablet Take 1 tablet (  20 mg total) by mouth daily. 30 tablet 12 04/29/2017 at Unknown time  . fluticasone furoate-vilanterol (BREO ELLIPTA) 200-25 MCG/INH AEPB Inhale 1 puff into the lungs daily.   04/29/2017 at Unknown time  . furosemide (LASIX) 20 MG tablet Take 1 tablet (20 mg total) by mouth daily. 30 tablet 0 04/29/2017 at Unknown time  . gabapentin (NEURONTIN) 100 MG capsule Take 2 capsules (200 mg total) by mouth at bedtime. 30 capsule 0 04/28/2017 at Unknown time  . levothyroxine (SYNTHROID, LEVOTHROID) 137 MCG tablet Take 137 mcg by mouth daily before breakfast.   04/29/2017 at Unknown time  . loperamide (IMODIUM) 2 MG capsule Take 1 capsule (2 mg total) by mouth every morning. (Patient taking differently: Take 2 mg by mouth every morning. *May take up to 4 times daily as needed for loose stools and diarrhea) 90 capsule 5 Past Week at Unknown time  . metoprolol tartrate (LOPRESSOR) 25 MG tablet TAKE 1/2 TABLET BY MOUTH TWICE DAILY 90 tablet 1 04/29/2017 at 1030-1100a  . omeprazole (PRILOSEC) 20 MG capsule TAKE ONE CAPSULE BY MOUTH DAILY 90 capsule 3 04/29/2017 at Unknown  time  . Pediatric Multivitamins-Iron (FLINTSTONES PLUS IRON) chewable tablet Chew 1 tablet by mouth 2 (two) times daily.   04/29/2017 at Unknown time  . potassium chloride SA (K-DUR,KLOR-CON) 20 MEQ tablet Take 1 tablet (20 mEq total) by mouth daily. 30 tablet 0 04/29/2017 at Unknown time  . sodium bicarbonate 650 MG tablet Take 1 tablet (650 mg total) by mouth 2 (two) times daily. 60 tablet 0 04/29/2017 at Unknown time  . triamcinolone (NASACORT ALLERGY 24HR) 55 MCG/ACT AERO nasal inhaler Place 2 sprays into the nose daily.   unknown  . vancomycin (VANCOCIN) 50 mg/mL oral solution Take 2.5 mLs (125 mg total) by mouth 2 (two) times daily. 70 mL 0 04/28/2017 at Unknown time  . allopurinol (ZYLOPRIM) 100 MG tablet Take 150 mg by mouth daily.  0   . Amino Acids-Protein Hydrolys (FEEDING SUPPLEMENT, PRO-STAT SUGAR FREE 64,) LIQD Take 30 mLs by mouth 2 (two) times daily. (Patient not taking: Reported on 04/29/2017) 900 mL 0 Not Taking at Unknown time  . Doxepin HCl (SILENOR) 6 MG TABS Take 1 tablet (6 mg total) by mouth at bedtime as needed (for sleep). (Patient not taking: Reported on 04/29/2017) 10 tablet 0 Not Taking at Unknown time  . insulin glargine (LANTUS) 100 UNIT/ML injection Inject 0.2 mLs (20 Units total) into the skin daily. (Patient not taking: Reported on 04/29/2017) 10 mL 11 Not Taking at Unknown time  . ondansetron (ZOFRAN-ODT) 4 MG disintegrating tablet Take 1 tablet (4 mg total) by mouth 3 (three) times daily before meals. (Patient not taking: Reported on 04/29/2017) 20 tablet 0 Not Taking at Unknown time   Scheduled: . albuterol  3 mL Inhalation TID  . allopurinol  150 mg Oral Daily  . apixaban  5 mg Oral BID  . calcium carbonate  1 tablet Oral BID  . cholecalciferol  1,000 Units Oral q morning - 10a  . colchicine  0.6 mg Oral Q48H  . diltiazem  120 mg Oral Daily  . escitalopram  20 mg Oral Daily  . feeding supplement  1 Container Oral TID BM  . feeding supplement (PRO-STAT SUGAR FREE 64)  30 mL  Oral BID  . fluticasone  2 spray Each Nare Daily  . fluticasone furoate-vilanterol  1 puff Inhalation Daily  . gabapentin  200 mg Oral QHS  . insulin aspart  0-9 Units  Subcutaneous TID WC  . levothyroxine  137 mcg Oral QAC breakfast  . metoprolol tartrate  12.5 mg Oral BID  . multivitamin with minerals  1 tablet Oral BID  . ondansetron  4 mg Oral TID AC & HS  . sodium bicarbonate  650 mg Oral BID  . sodium chloride flush  3 mL Intravenous Q12H   Continuous: . sodium chloride    . doxycycline (VIBRAMYCIN) IV Stopped (05/06/17 0109)   DIY:MEBRAX chloride, acetaminophen **OR** acetaminophen, acetaminophen, ondansetron (ZOFRAN) IV, polyvinyl alcohol, sodium chloride flush  Assesment: She was admitted with cellulitis of her abdominal wall and she may have some cellulitis of her left arm as well. That is improving. She had acute on chronic diastolic heart failure received Lasix and her renal function is not as good. This is been an ongoing problem that she develops acute on chronic renal failure when she has diuresis for her heart failure. It may be that her nausea is related to her renal dysfunction. Principal Problem:   Cellulitis Active Problems:   HTN (hypertension)   COPD (chronic obstructive pulmonary disease) (HCC)   PAF (paroxysmal atrial fibrillation) (HCC)   Chronic anticoagulation   Chronic edema   Obesity   Diabetes mellitus type 2 in obese (HCC)   Chronic diastolic CHF (congestive heart failure) ([place])    Plan: Start Zofran prior to meals. Recheck blood tomorrow. She's not ready for discharge.    LOS: 7 days   Shenouda Genova L 05/06/2017, 8:48 AM

## 2017-05-06 NOTE — [place] Notes (Signed)
PHARMACIST - PHYSICIAN COMMUNICATION DR:   Juanetta GoslingHAWKINS CONCERNING: Antibiotic IV to Oral Route Change Policy  RECOMMENDATION: This patient is receiving DOXYCYCLINE by the intravenous route.  Based on criteria approved by the Pharmacy and Therapeutics Committee, the antibiotic(s) is/are being converted to the equivalent oral dose form(s).   DESCRIPTION: These criteria include:  Patient being treated for a respiratory tract infection, urinary tract infection, cellulitis or clostridium difficile associated diarrhea if on metronidazole  The patient is not neutropenic and does not exhibit a GI malabsorption state  The patient is eating (either orally or via tube) and/or has been taking other orally administered medications for a least 24 hours  The patient is improving clinically and has a Tmax < 100.5  If you have questions about this conversion, please contact the Pharmacy Department  [x]   [phone]( [phone] )  [facility] []   [phone]( [phone] )  [facility] []   [phone]( [phone]  [facility] []   [phone]( [phone]  [facility] []   [phone]( [phone] )  [facility]    Valrie HartScott Grace Haggart, PharmD Clinical Pharmacist Pager:  [phone] 05/06/2017

## 2017-05-07 LAB — BASIC METABOLIC PANEL
ANION GAP: 11 (ref 5–15)
BUN: 16 mg/dL (ref 6–20)
CHLORIDE: 87 mmol/L — AB (ref 101–111)
CO2: 31 mmol/L (ref 22–32)
Calcium: 7.4 mg/dL — ABNORMAL LOW (ref 8.9–10.3)
Creatinine, Ser: 2.51 mg/dL — ABNORMAL HIGH (ref 0.44–1.00)
GFR, EST AFRICAN AMERICAN: 21 mL/min — AB (ref >60)
GFR, EST NON AFRICAN AMERICAN: 18 mL/min — AB (ref >60)
Glucose, Bld: 93 mg/dL (ref 65–99)
POTASSIUM: 3.8 mmol/L (ref 3.5–5.1)
SODIUM: 129 mmol/L — AB (ref 135–145)

## 2017-05-07 LAB — CBC WITH DIFFERENTIAL/PLATELET
BASOS ABS: 0 10*3/uL (ref 0.0–0.1)
Basophils Relative: 0 %
EOS PCT: 4 %
Eosinophils Absolute: 0.3 10*3/uL (ref 0.0–0.7)
HCT: 25 % — ABNORMAL LOW (ref 36.0–46.0)
HEMOGLOBIN: 8.4 g/dL — AB (ref 12.0–15.0)
LYMPHS ABS: 1.3 10*3/uL (ref 0.7–4.0)
LYMPHS PCT: 16 %
MCH: 32.7 pg (ref 26.0–34.0)
MCHC: 33.6 g/dL (ref 30.0–36.0)
MCV: 97.3 fL (ref 78.0–100.0)
Monocytes Absolute: 0.9 10*3/uL (ref 0.1–1.0)
Monocytes Relative: 10 %
NEUTROS ABS: 5.7 10*3/uL (ref 1.7–7.7)
NEUTROS PCT: 70 %
PLATELETS: 173 10*3/uL (ref 150–400)
RBC: 2.57 MIL/uL — AB (ref 3.87–5.11)
RDW: 17.5 % — ABNORMAL HIGH (ref 11.5–15.5)
WBC: 8.2 10*3/uL (ref 4.0–10.5)

## 2017-05-07 LAB — GLUCOSE, CAPILLARY
GLUCOSE-CAPILLARY: 95 mg/dL (ref 65–99)
GLUCOSE-CAPILLARY: 99 mg/dL (ref 65–99)
GLUCOSE-CAPILLARY: 98 mg/dL (ref 65–99)
Glucose-Capillary: 90 mg/dL (ref 65–99)

## 2017-05-07 LAB — C DIFFICILE QUICK SCREEN W PCR REFLEX
C DIFFICILE (CDIFF) INTERP: NOT DETECTED
C Diff antigen: NEGATIVE
C Diff toxin: NEGATIVE

## 2017-05-07 MED ORDER — LOPERAMIDE HCL 2 MG PO CAPS
2.0000 mg | ORAL_CAPSULE | Freq: Two times a day (BID) | ORAL | Status: DC
  Administered 2017-05-07 – 2017-05-13 (×13): 2 mg via ORAL
  Filled 2017-05-07 (×14): qty 1

## 2017-05-07 MED ORDER — SODIUM CHLORIDE 0.9 % IV SOLN
INTRAVENOUS | Status: DC
  Administered 2017-05-07 – 2017-05-12 (×5): via INTRAVENOUS

## 2017-05-07 NOTE — Care Management Note (Signed)
Case Management Note  Patient Details  Name: Yolanda [name] MRN: [id number] Date of Birth: 1940-12-05  If discussed at Long Length of Stay Meetings, dates discussed:  05/07/2017   [name], [name], RN 05/07/2017, 1:21 PM

## 2017-05-07 NOTE — Progress Notes (Signed)
Subjective: She's having more trouble. She's not been able to eat and says she had sick at the smell of food. She's had multiple loose stools in the last 24 hours. She is off treatment for C. difficile. Her blood pressure has been soft. She still has fluid in her abdomen and both arms and legs but it is a little bit less. No other new complaints  Objective: Vital signs in last 24 hours: Temp:  [98 F (36.7 C)] 98 F (36.7 C) (08/14 7062) Pulse Rate:  [90-96] 90 (08/14 0632) Resp:  [16-20] 16 (08/14 3762) BP: (80-107)/(39-65) 106/39 (08/14 8315) SpO2:  [93 %-99 %] 97 % (08/14 0802) Weight change:  Last BM Date: 05/06/17  Intake/Output from previous day: 08/13 0701 - 08/14 0700 In: 1200 [P.O.:1200] Out: 650 [Urine:650]  PHYSICAL EXAM General appearance: alert, cooperative and mild distress Resp: clear to auscultation bilaterally Cardio: regular rate and rhythm, S1, S2 normal, no murmur, click, rub or gallop GI: Cellulitis of the abdominal wall looks better Extremities: Cellulitis of her left arm looks better. She still has some significant third spacing of fluid in her arms and legs She looks anxious  Lab Results:  Results for orders placed or performed during the hospital encounter of 04/29/17 (from the past 48 hour(s))  Glucose, capillary     Status: Abnormal   Collection Time: 05/05/17 11:30 AM  Result Value Ref Range   Glucose-Capillary 101 (H) 65 - 99 mg/dL   Comment 1 Notify RN    Comment 2 Document in Chart   Glucose, capillary     Status: None   Collection Time: 05/05/17  4:37 PM  Result Value Ref Range   Glucose-Capillary 97 65 - 99 mg/dL   Comment 1 Notify RN    Comment 2 Document in Chart   Glucose, capillary     Status: None   Collection Time: 05/05/17  9:51 PM  Result Value Ref Range   Glucose-Capillary 79 65 - 99 mg/dL   Comment 1 Notify RN    Comment 2 Document in Chart   Glucose, capillary     Status: Abnormal   Collection Time: 05/05/17 10:40 PM   Result Value Ref Range   Glucose-Capillary 126 (H) 65 - 99 mg/dL   Comment 1 Notify RN    Comment 2 Document in Chart   Basic metabolic panel     Status: Abnormal   Collection Time: 05/06/17  5:45 AM  Result Value Ref Range   Sodium 131 (L) 135 - 145 mmol/L   Potassium 3.8 3.5 - 5.1 mmol/L   Chloride 89 (L) 101 - 111 mmol/L   CO2 32 22 - 32 mmol/L   Glucose, Bld 91 65 - 99 mg/dL   BUN 15 6 - 20 mg/dL   Creatinine, Ser 1.76 (H) 0.44 - 1.00 mg/dL   Calcium 7.3 (L) 8.9 - 10.3 mg/dL   GFR calc non Af Amer 21 (L) >60 mL/min   GFR calc Af Amer 24 (L) >60 mL/min    Comment: (NOTE) The eGFR has been calculated using the CKD EPI equation. This calculation has not been validated in all clinical situations. eGFR's persistently <60 mL/min signify possible Chronic Kidney Disease.    Anion gap 10 5 - 15  Glucose, capillary     Status: None   Collection Time: 05/06/17  8:15 AM  Result Value Ref Range   Glucose-Capillary 85 65 - 99 mg/dL  Glucose, capillary     Status: None   Collection  Time: 05/06/17 11:10 AM  Result Value Ref Range   Glucose-Capillary 85 65 - 99 mg/dL  Glucose, capillary     Status: Abnormal   Collection Time: 05/06/17  4:27 PM  Result Value Ref Range   Glucose-Capillary 105 (H) 65 - 99 mg/dL  Glucose, capillary     Status: None   Collection Time: 05/06/17 10:34 PM  Result Value Ref Range   Glucose-Capillary 98 65 - 99 mg/dL   Comment 1 Notify RN    Comment 2 Document in Chart   Glucose, capillary     Status: None   Collection Time: 05/07/17  7:29 AM  Result Value Ref Range   Glucose-Capillary 90 65 - 99 mg/dL   Comment 1 Notify RN    Comment 2 Document in Chart     ABGS No results for input(s): PHART, PO2ART, TCO2, HCO3 in the last 72 hours.  Invalid input(s): PCO2 CULTURES Recent Results (from the past 240 hour(s))  Culture, blood (Routine X 2) w Reflex to ID Panel     Status: None   Collection Time: 04/29/17  8:18 PM  Result Value Ref Range Status    Specimen Description BLOOD RIGHT ARM  Final   Special Requests   Final    BOTTLES DRAWN AEROBIC AND ANAEROBIC Blood Culture adequate volume   Culture NO GROWTH 5 DAYS  Final   Report Status 05/04/2017 FINAL  Final  Culture, blood (Routine X 2) w Reflex to ID Panel     Status: None   Collection Time: 04/29/17  8:26 PM  Result Value Ref Range Status   Specimen Description BLOOD RIGHT ARM  Final   Special Requests   Final    BOTTLES DRAWN AEROBIC AND ANAEROBIC Blood Culture adequate volume   Culture NO GROWTH 5 DAYS  Final   Report Status 05/04/2017 FINAL  Final  MRSA PCR Screening     Status: None   Collection Time: 04/30/17  1:00 AM  Result Value Ref Range Status   MRSA by PCR NEGATIVE NEGATIVE Final    Comment:        The GeneXpert MRSA Assay (FDA approved for NASAL specimens only), is one component of a comprehensive MRSA colonization surveillance program. It is not intended to diagnose MRSA infection nor to guide or monitor treatment for MRSA infections.   C difficile quick scan w PCR reflex     Status: None   Collection Time: 04/30/17  4:55 PM  Result Value Ref Range Status   C Diff antigen NEGATIVE NEGATIVE Final   C Diff toxin NEGATIVE NEGATIVE Final   C Diff interpretation No C. difficile detected.  Final   Studies/Results: No results found.  Medications:  Prior to Admission:  Prescriptions Prior to Admission  Medication Sig Dispense Refill Last Dose  . acetaminophen (TYLENOL) 325 MG tablet Take 2 tablets (650 mg total) by mouth every 6 (six) hours as needed for mild pain (or Fever >/= 101).   Past Week at Unknown time  . albuterol (PROVENTIL HFA;VENTOLIN HFA) 108 (90 Base) MCG/ACT inhaler Inhale 2 puffs into the lungs 4 (four) times daily.    04/29/2017 at Unknown time  . apixaban (ELIQUIS) 5 MG TABS tablet Take 1 tablet (5 mg total) by mouth 2 (two) times daily. 60 tablet 5 04/29/2017 at 1030-1100a  . calcium carbonate (TUMS - DOSED IN MG ELEMENTAL CALCIUM) 500 MG  chewable tablet Chew 1 tablet by mouth 2 (two) times daily.   04/29/2017 at Unknown time  .  carboxymethylcellulose (REFRESH PLUS) 0.5 % SOLN Apply 1 drop to eye daily as needed (for dry eyes).   Past Week at Unknown time  . cetirizine (ZYRTEC) 10 MG tablet Take 10 mg by mouth daily.   04/29/2017 at Unknown time  . cholecalciferol (VITAMIN D) 1000 units tablet Take 1,000 Units by mouth every morning.   04/29/2017 at Unknown time  . colchicine 0.6 MG tablet Take 1 tablet (0.6 mg total) by mouth daily. 30 tablet 0 04/29/2017 at Unknown time  . diltiazem (CARDIZEM CD) 120 MG 24 hr capsule Take 1 capsule (120 mg total) by mouth daily. 90 capsule 3 04/29/2017 at Unknown time  . escitalopram (LEXAPRO) 20 MG tablet Take 1 tablet (20 mg total) by mouth daily. 30 tablet 12 04/29/2017 at Unknown time  . fluticasone furoate-vilanterol (BREO ELLIPTA) 200-25 MCG/INH AEPB Inhale 1 puff into the lungs daily.   04/29/2017 at Unknown time  . furosemide (LASIX) 20 MG tablet Take 1 tablet (20 mg total) by mouth daily. 30 tablet 0 04/29/2017 at Unknown time  . gabapentin (NEURONTIN) 100 MG capsule Take 2 capsules (200 mg total) by mouth at bedtime. 30 capsule 0 04/28/2017 at Unknown time  . levothyroxine (SYNTHROID, LEVOTHROID) 137 MCG tablet Take 137 mcg by mouth daily before breakfast.   04/29/2017 at Unknown time  . loperamide (IMODIUM) 2 MG capsule Take 1 capsule (2 mg total) by mouth every morning. (Patient taking differently: Take 2 mg by mouth every morning. *May take up to 4 times daily as needed for loose stools and diarrhea) 90 capsule 5 Past Week at Unknown time  . metoprolol tartrate (LOPRESSOR) 25 MG tablet TAKE 1/2 TABLET BY MOUTH TWICE DAILY 90 tablet 1 04/29/2017 at 1030-1100a  . omeprazole (PRILOSEC) 20 MG capsule TAKE ONE CAPSULE BY MOUTH DAILY 90 capsule 3 04/29/2017 at Unknown time  . Pediatric Multivitamins-Iron (FLINTSTONES PLUS IRON) chewable tablet Chew 1 tablet by mouth 2 (two) times daily.   04/29/2017 at Unknown time  .  potassium chloride SA (K-DUR,KLOR-CON) 20 MEQ tablet Take 1 tablet (20 mEq total) by mouth daily. 30 tablet 0 04/29/2017 at Unknown time  . sodium bicarbonate 650 MG tablet Take 1 tablet (650 mg total) by mouth 2 (two) times daily. 60 tablet 0 04/29/2017 at Unknown time  . triamcinolone (NASACORT ALLERGY 24HR) 55 MCG/ACT AERO nasal inhaler Place 2 sprays into the nose daily.   unknown  . vancomycin (VANCOCIN) 50 mg/mL oral solution Take 2.5 mLs (125 mg total) by mouth 2 (two) times daily. 70 mL 0 04/28/2017 at Unknown time  . allopurinol (ZYLOPRIM) 100 MG tablet Take 150 mg by mouth daily.  0   . Amino Acids-Protein Hydrolys (FEEDING SUPPLEMENT, PRO-STAT SUGAR FREE 64,) LIQD Take 30 mLs by mouth 2 (two) times daily. (Patient not taking: Reported on 04/29/2017) 900 mL 0 Not Taking at Unknown time  . Doxepin HCl (SILENOR) 6 MG TABS Take 1 tablet (6 mg total) by mouth at bedtime as needed (for sleep). (Patient not taking: Reported on 04/29/2017) 10 tablet 0 Not Taking at Unknown time  . insulin glargine (LANTUS) 100 UNIT/ML injection Inject 0.2 mLs (20 Units total) into the skin daily. (Patient not taking: Reported on 04/29/2017) 10 mL 11 Not Taking at Unknown time  . ondansetron (ZOFRAN-ODT) 4 MG disintegrating tablet Take 1 tablet (4 mg total) by mouth 3 (three) times daily before meals. (Patient not taking: Reported on 04/29/2017) 20 tablet 0 Not Taking at Unknown time   Scheduled: . albuterol  3 mL Inhalation TID  . allopurinol  150 mg Oral Daily  . apixaban  5 mg Oral BID  . calcium carbonate  1 tablet Oral BID  . cholecalciferol  1,000 Units Oral q morning - 10a  . colchicine  0.6 mg Oral Q48H  . diltiazem  120 mg Oral Daily  . doxycycline  100 mg Oral Q12H  . escitalopram  20 mg Oral Daily  . feeding supplement  1 Container Oral TID BM  . feeding supplement (PRO-STAT SUGAR FREE 64)  30 mL Oral BID  . fluticasone  2 spray Each Nare Daily  . fluticasone furoate-vilanterol  1 puff Inhalation Daily  .  gabapentin  200 mg Oral QHS  . insulin aspart  0-9 Units Subcutaneous TID WC  . levothyroxine  137 mcg Oral QAC breakfast  . metoprolol tartrate  12.5 mg Oral BID  . multivitamin with minerals  1 tablet Oral BID  . ondansetron  4 mg Oral TID AC & HS  . sodium bicarbonate  650 mg Oral BID  . sodium chloride flush  3 mL Intravenous Q12H   Continuous: . sodium chloride    . sodium chloride     PYK:DXIPJA chloride, acetaminophen **OR** acetaminophen, acetaminophen, ondansetron (ZOFRAN) IV, polyvinyl alcohol, sodium chloride flush  Assesment: She was admitted with cellulitis of her abdominal wall. She has a very complicated situation with chronic nausea chronic vomiting chronic diarrhea chronic C. difficile infection diabetes acute on chronic diastolic heart failure acute on chronic renal failure COPD paroxysmal atrial fib. She has some element of malnutrition. Principal Problem:   Cellulitis Active Problems:   HTN (hypertension)   COPD (chronic obstructive pulmonary disease) (HCC)   PAF (paroxysmal atrial fibrillation) (HCC)   Chronic anticoagulation   Chronic edema   Obesity   Diabetes mellitus type 2 in obese (HCC)   Chronic diastolic CHF (congestive heart failure) ([place])    Plan: I'm going to give her a small amount of IV fluids. Recheck C. difficile. Formal GI consult now.    LOS: 8 days   Camden Knotek L 05/07/2017, 8:47 AM

## 2017-05-08 DIAGNOSIS — L0390 Cellulitis, unspecified: Secondary | ICD-10-CM

## 2017-05-08 DIAGNOSIS — R7989 Other specified abnormal findings of blood chemistry: Secondary | ICD-10-CM

## 2017-05-08 LAB — CBC WITH DIFFERENTIAL/PLATELET
BASOS PCT: 1 %
Basophils Absolute: 0 10*3/uL (ref 0.0–0.1)
Eosinophils Absolute: 0.4 10*3/uL (ref 0.0–0.7)
Eosinophils Relative: 6 %
HEMATOCRIT: 23.2 % — AB (ref 36.0–46.0)
HEMOGLOBIN: 7.7 g/dL — AB (ref 12.0–15.0)
LYMPHS ABS: 1.5 10*3/uL (ref 0.7–4.0)
LYMPHS PCT: 23 %
MCH: 32.4 pg (ref 26.0–34.0)
MCHC: 33.2 g/dL (ref 30.0–36.0)
MCV: 97.5 fL (ref 78.0–100.0)
MONOS PCT: 11 %
Monocytes Absolute: 0.7 10*3/uL (ref 0.1–1.0)
NEUTROS ABS: 4 10*3/uL (ref 1.7–7.7)
NEUTROS PCT: 59 %
Platelets: 163 10*3/uL (ref 150–400)
RBC: 2.38 MIL/uL — ABNORMAL LOW (ref 3.87–5.11)
RDW: 17.4 % — ABNORMAL HIGH (ref 11.5–15.5)
WBC: 6.7 10*3/uL (ref 4.0–10.5)

## 2017-05-08 LAB — GASTROINTESTINAL PANEL BY PCR, STOOL (REPLACES STOOL CULTURE)

## 2017-05-08 LAB — GLUCOSE, CAPILLARY
GLUCOSE-CAPILLARY: 82 mg/dL (ref 65–99)
Glucose-Capillary: 111 mg/dL — ABNORMAL HIGH (ref 65–99)
Glucose-Capillary: 89 mg/dL (ref 65–99)
Glucose-Capillary: 99 mg/dL (ref 65–99)

## 2017-05-08 LAB — BASIC METABOLIC PANEL
Anion gap: 9 (ref 5–15)
BUN: 17 mg/dL (ref 6–20)
CHLORIDE: 91 mmol/L — AB (ref 101–111)
CO2: 31 mmol/L (ref 22–32)
CREATININE: 2.55 mg/dL — AB (ref 0.44–1.00)
Calcium: 7.2 mg/dL — ABNORMAL LOW (ref 8.9–10.3)
GFR calc non Af Amer: 17 mL/min — ABNORMAL LOW (ref >60)
GFR, EST AFRICAN AMERICAN: 20 mL/min — AB (ref >60)
GLUCOSE: 74 mg/dL (ref 65–99)
Potassium: 3.8 mmol/L (ref 3.5–5.1)
Sodium: 131 mmol/L — ABNORMAL LOW (ref 135–145)

## 2017-05-08 NOTE — Consult Note (Signed)
Reason for Consult: Referring Physician:   [name] is an 76 y.o. female.  HPI:  Admitted thr the ED with Cellulitis of both legs.  Also has cellulitis to both arms. She has had diarrhea for 3-4 days.  Stools are very watery.  C diff negative one day ago.  She is having 5-6 stools. All stools are loose and watery. Hx of C diff in the past. 03/12/2017 C diff was positive and was treated with Vancomycin. Had been on Vancomycin up until admission.  Appetite has not been good. Hx of anemia.  EGD 02/05/2017 for dysphagia.  Impression:               - Normal esophagus.                           - Non-obstructing Schatzki ring.Ddilated.                           - 2 cm hiatal hernia.                           - Two gastric polyps. these were left alone.                           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  Past Medical History:  Diagnosis Date  . Barrett esophagus   . Bronchitis   . C. difficile colitis   . Cellulitis   . Cellulitis   . CHF (congestive heart failure) ([place])   . COPD (chronic obstructive pulmonary disease) ([place])   . Diabetes mellitus without complication ([place])   . Diastolic heart failure ([place])   . Difficulty walking   . Dysphagia   . Essential hypertension   . Gastritis   . GERD (gastroesophageal reflux disease)   . Gout   . Hepatomegaly    ???  . History of falling   . Hyponatremia   . Hypothyroidism   . Iron deficiency anemia   . Muscle weakness   . PAF (paroxysmal atrial fibrillation) (HCC)    Eliquis  . Sleep apnea   . Stage III chronic kidney disease     Past Surgical History:  Procedure Laterality Date  . BREAST BIOPSY Right Sept, 2012  . CHOLECYSTECTOMY    . COLONOSCOPY    . ESOPHAGEAL DILATION N/A 02/05/2017   Procedure: ESOPHAGEAL DILATION;  Surgeon:  Rogene Houston, MD;  Location: [facility];  Service: Endoscopy;  Laterality: N/A;  . ESOPHAGOGASTRODUODENOSCOPY N/A 02/05/2017   Procedure: ESOPHAGOGASTRODUODENOSCOPY (EGD);  Surgeon: Rogene Houston, MD;  Location: [facility];  Service: Endoscopy;  Laterality: N/A;  . UPPER GASTROINTESTINAL ENDOSCOPY      Family History  Problem Relation Age of Onset  . Dementia Mother   . Lung  cancer Father        smoked  . Hypertension Sister   . Diabetes Brother   . Obesity Sister   . Hypertension Sister   . Restless legs syndrome Sister   . Healthy Sister   . Lung cancer Maternal Uncle     Social History:  reports that she quit smoking about 17 years ago. Her smoking use included Cigarettes. She started smoking about 58 years ago. She has a 15.00 pack-year smoking history. She has never used smokeless tobacco. She reports that she does not drink alcohol or use drugs.  Allergies:  Allergies  Allergen Reactions  . Ciprofloxacin Shortness Of Breath    REACTION: sob,tachycardia  . Fish Oil     Patient face drew to the side,Bells [place]  . Guaifenesin Shortness Of Breath    REACTION: sob,tachycardia  . Mucinex [Guaifenesin Er] Shortness Of Breath  . Sulfamethoxazole W/Trimethoprim 800-160 [Sulfamethoxazole-Trimethoprim] Nausea Only    Also lack of appetite   . Uloric [Febuxostat] Swelling    No urination  . Adhesive [Tape] Other (See Comments)    Causes blisters on skin  . Cefuroxime Axetil Swelling    Swelling all over body-per patient she was hospitalized as a result of taking this medication  . Metolazone Nausea Only    Swelling   . Tramadol     insomnia    Medications: I have reviewed the patient's current medications.  Results for orders placed or performed during the hospital encounter of 04/29/17 (from the past 48 hour(s))  Glucose, capillary     Status: None   Collection Time: 05/06/17 11:10 AM  Result Value Ref Range   Glucose-Capillary 85 65 - 99 mg/dL  Glucose,  capillary     Status: Abnormal   Collection Time: 05/06/17  4:27 PM  Result Value Ref Range   Glucose-Capillary 105 (H) 65 - 99 mg/dL  Glucose, capillary     Status: None   Collection Time: 05/06/17 10:34 PM  Result Value Ref Range   Glucose-Capillary 98 65 - 99 mg/dL   Comment 1 Notify RN    Comment 2 Document in Chart   Glucose, capillary     Status: None   Collection Time: 05/07/17  7:29 AM  Result Value Ref Range   Glucose-Capillary 90 65 - 99 mg/dL   Comment 1 Notify RN    Comment 2 Document in Chart   Basic metabolic panel     Status: Abnormal   Collection Time: 05/07/17  9:17 AM  Result Value Ref Range   Sodium 129 (L) 135 - 145 mmol/L   Potassium 3.8 3.5 - 5.1 mmol/L   Chloride 87 (L) 101 - 111 mmol/L   CO2 31 22 - 32 mmol/L   Glucose, Bld 93 65 - 99 mg/dL   BUN 16 6 - 20 mg/dL   Creatinine, Ser 2.51 (H) 0.44 - 1.00 mg/dL   Calcium 7.4 (L) 8.9 - 10.3 mg/dL   GFR calc non Af Amer 18 (L) >60 mL/min   GFR calc Af Amer 21 (L) >60 mL/min    Comment: (NOTE) The eGFR has been calculated using the CKD EPI equation. This calculation has not been validated in all clinical situations. eGFR's persistently <60 mL/min signify possible Chronic Kidney Disease.    Anion gap 11 5 - 15  CBC with Differential/Platelet     Status: Abnormal   Collection Time: 05/07/17  9:17 AM  Result Value Ref Range   WBC 8.2 4.0 - 10.5 K/uL  RBC 2.57 (L) 3.87 - 5.11 MIL/uL   Hemoglobin 8.4 (L) 12.0 - 15.0 g/dL   HCT 25.0 (L) 36.0 - 46.0 %   MCV 97.3 78.0 - 100.0 fL   MCH 32.7 26.0 - 34.0 pg   MCHC 33.6 30.0 - 36.0 g/dL   RDW 17.5 (H) 11.5 - 15.5 %   Platelets 173 150 - 400 K/uL   Neutrophils Relative % 70 %   Neutro Abs 5.7 1.7 - 7.7 K/uL   Lymphocytes Relative 16 %   Lymphs Abs 1.3 0.7 - 4.0 K/uL   Monocytes Relative 10 %   Monocytes Absolute 0.9 0.1 - 1.0 K/uL   Eosinophils Relative 4 %   Eosinophils Absolute 0.3 0.0 - 0.7 K/uL   Basophils Relative 0 %   Basophils Absolute 0.0 0.0 -  0.1 K/uL  C difficile quick scan w PCR reflex     Status: None   Collection Time: 05/07/17 10:00 AM  Result Value Ref Range   C Diff antigen NEGATIVE NEGATIVE   C Diff toxin NEGATIVE NEGATIVE   C Diff interpretation No C. difficile detected.   Glucose, capillary     Status: None   Collection Time: 05/07/17 11:28 AM  Result Value Ref Range   Glucose-Capillary 95 65 - 99 mg/dL   Comment 1 Notify RN    Comment 2 Document in Chart   Glucose, capillary     Status: None   Collection Time: 05/07/17  4:33 PM  Result Value Ref Range   Glucose-Capillary 99 65 - 99 mg/dL  Glucose, capillary     Status: None   Collection Time: 05/07/17  9:15 PM  Result Value Ref Range   Glucose-Capillary 98 65 - 99 mg/dL   Comment 1 Notify RN    Comment 2 Document in Chart   CBC with Differential/Platelet     Status: Abnormal   Collection Time: 05/08/17  6:04 AM  Result Value Ref Range   WBC 6.7 4.0 - 10.5 K/uL   RBC 2.38 (L) 3.87 - 5.11 MIL/uL   Hemoglobin 7.7 (L) 12.0 - 15.0 g/dL   HCT 23.2 (L) 36.0 - 46.0 %   MCV 97.5 78.0 - 100.0 fL   MCH 32.4 26.0 - 34.0 pg   MCHC 33.2 30.0 - 36.0 g/dL   RDW 17.4 (H) 11.5 - 15.5 %   Platelets 163 150 - 400 K/uL   Neutrophils Relative % 59 %   Neutro Abs 4.0 1.7 - 7.7 K/uL   Lymphocytes Relative 23 %   Lymphs Abs 1.5 0.7 - 4.0 K/uL   Monocytes Relative 11 %   Monocytes Absolute 0.7 0.1 - 1.0 K/uL   Eosinophils Relative 6 %   Eosinophils Absolute 0.4 0.0 - 0.7 K/uL   Basophils Relative 1 %   Basophils Absolute 0.0 0.0 - 0.1 K/uL  Basic metabolic panel     Status: Abnormal   Collection Time: 05/08/17  6:04 AM  Result Value Ref Range   Sodium 131 (L) 135 - 145 mmol/L   Potassium 3.8 3.5 - 5.1 mmol/L   Chloride 91 (L) 101 - 111 mmol/L   CO2 31 22 - 32 mmol/L   Glucose, Bld 74 65 - 99 mg/dL   BUN 17 6 - 20 mg/dL   Creatinine, Ser 2.55 (H) 0.44 - 1.00 mg/dL   Calcium 7.2 (L) 8.9 - 10.3 mg/dL   GFR calc non Af Amer 17 (L) >60 mL/min   GFR calc Af Amer 20 (L)  >  60 mL/min    Comment: (NOTE) The eGFR has been calculated using the CKD EPI equation. This calculation has not been validated in all clinical situations. eGFR's persistently <60 mL/min signify possible Chronic Kidney Disease.    Anion gap 9 5 - 15  Glucose, capillary     Status: None   Collection Time: 05/08/17  8:17 AM  Result Value Ref Range   Glucose-Capillary 82 65 - 99 mg/dL   Comment 1 Notify RN    Comment 2 Document in Chart     No results found.  ROS Blood pressure (!) 112/47, pulse 89, temperature 98.5 F (36.9 C), temperature source Oral, resp. rate 20, height _0  (1.549 m), weight 197 lb 1.5 oz (89.4 kg), SpO2 100 %. Physical Exam Alert and oriented. Skin warm and dry. Oral mucosa is moist.   . Sclera anicteric, conjunctivae is pink. Thyroid not enlarged. No cervical lymphadenopathy. Lungs clear. Heart regular rate and rhythm.  Abdomen is soft. Bowel sounds are positive. No hepatomegaly. No abdominal masses felt. No tenderness. 1-2 + edema to lower extremities. Cellulitis to left leg. Cellulitis noted to both upper extremities. Cellulitis noted to rt flank.  Assessment/Plan: Diarrhea. Recent hx of c-diff. Will get stool studies, GI pathogen.  Further recomemdations to follow.   Rigby Leonhardt W 05/08/2017, 8:24 AM

## 2017-05-08 NOTE — Progress Notes (Signed)
Subjective: She looks substantially better today. She says she feels better. She has less nausea. She is still receiving IV fluids and says she feels better. C. difficile was negative yesterday.  Objective: Vital signs in last 24 hours: Temp:  [98.5 F (36.9 C)-98.8 F (37.1 C)] 98.5 F (36.9 C) (08/15 0547) Pulse Rate:  [89-103] 89 (08/15 0547) Resp:  [15-20] 20 (08/15 0547) BP: (102-123)/(41-51) 112/47 (08/15 0547) SpO2:  [97 %-100 %] 100 % (08/15 0752) Weight change:  Last BM Date: 05/07/17  Intake/Output from previous day: 08/14 0701 - 08/15 0700 In: 384.2 [P.O.:120; I.V.:264.2] Out: -   PHYSICAL EXAM General appearance: alert, cooperative and no distress Resp: rhonchi bilaterally Cardio: regular rate and rhythm, S1, S2 normal, no murmur, click, rub or gallop GI: soft, non-tender; bowel sounds normal; no masses,  no organomegaly Extremities: venous stasis dermatitis noted The cellulitis on her abdominal wall and her left arm looks much better.  Lab Results:  Results for orders placed or performed during the hospital encounter of 04/29/17 (from the past 48 hour(s))  Glucose, capillary     Status: None   Collection Time: 05/06/17 11:10 AM  Result Value Ref Range   Glucose-Capillary 85 65 - 99 mg/dL  Glucose, capillary     Status: Abnormal   Collection Time: 05/06/17  4:27 PM  Result Value Ref Range   Glucose-Capillary 105 (H) 65 - 99 mg/dL  Glucose, capillary     Status: None   Collection Time: 05/06/17 10:34 PM  Result Value Ref Range   Glucose-Capillary 98 65 - 99 mg/dL   Comment 1 Notify RN    Comment 2 Document in Chart   Glucose, capillary     Status: None   Collection Time: 05/07/17  7:29 AM  Result Value Ref Range   Glucose-Capillary 90 65 - 99 mg/dL   Comment 1 Notify RN    Comment 2 Document in Chart   Basic metabolic panel     Status: Abnormal   Collection Time: 05/07/17  9:17 AM  Result Value Ref Range   Sodium 129 (L) 135 - 145 mmol/L   Potassium  3.8 3.5 - 5.1 mmol/L   Chloride 87 (L) 101 - 111 mmol/L   CO2 31 22 - 32 mmol/L   Glucose, Bld 93 65 - 99 mg/dL   BUN 16 6 - 20 mg/dL   Creatinine, Ser 2.51 (H) 0.44 - 1.00 mg/dL   Calcium 7.4 (L) 8.9 - 10.3 mg/dL   GFR calc non Af Amer 18 (L) >60 mL/min   GFR calc Af Amer 21 (L) >60 mL/min    Comment: (NOTE) The eGFR has been calculated using the CKD EPI equation. This calculation has not been validated in all clinical situations. eGFR's persistently <60 mL/min signify possible Chronic Kidney Disease.    Anion gap 11 5 - 15  CBC with Differential/Platelet     Status: Abnormal   Collection Time: 05/07/17  9:17 AM  Result Value Ref Range   WBC 8.2 4.0 - 10.5 K/uL   RBC 2.57 (L) 3.87 - 5.11 MIL/uL   Hemoglobin 8.4 (L) 12.0 - 15.0 g/dL   HCT 25.0 (L) 36.0 - 46.0 %   MCV 97.3 78.0 - 100.0 fL   MCH 32.7 26.0 - 34.0 pg   MCHC 33.6 30.0 - 36.0 g/dL   RDW 17.5 (H) 11.5 - 15.5 %   Platelets 173 150 - 400 K/uL   Neutrophils Relative % 70 %   Neutro Abs 5.7  1.7 - 7.7 K/uL   Lymphocytes Relative 16 %   Lymphs Abs 1.3 0.7 - 4.0 K/uL   Monocytes Relative 10 %   Monocytes Absolute 0.9 0.1 - 1.0 K/uL   Eosinophils Relative 4 %   Eosinophils Absolute 0.3 0.0 - 0.7 K/uL   Basophils Relative 0 %   Basophils Absolute 0.0 0.0 - 0.1 K/uL  C difficile quick scan w PCR reflex     Status: None   Collection Time: 05/07/17 10:00 AM  Result Value Ref Range   C Diff antigen NEGATIVE NEGATIVE   C Diff toxin NEGATIVE NEGATIVE   C Diff interpretation No C. difficile detected.   Glucose, capillary     Status: None   Collection Time: 05/07/17 11:28 AM  Result Value Ref Range   Glucose-Capillary 95 65 - 99 mg/dL   Comment 1 Notify RN    Comment 2 Document in Chart   Glucose, capillary     Status: None   Collection Time: 05/07/17  4:33 PM  Result Value Ref Range   Glucose-Capillary 99 65 - 99 mg/dL  Glucose, capillary     Status: None   Collection Time: 05/07/17  9:15 PM  Result Value Ref Range    Glucose-Capillary 98 65 - 99 mg/dL   Comment 1 Notify RN    Comment 2 Document in Chart   CBC with Differential/Platelet     Status: Abnormal   Collection Time: 05/08/17  6:04 AM  Result Value Ref Range   WBC 6.7 4.0 - 10.5 K/uL   RBC 2.38 (L) 3.87 - 5.11 MIL/uL   Hemoglobin 7.7 (L) 12.0 - 15.0 g/dL   HCT 23.2 (L) 36.0 - 46.0 %   MCV 97.5 78.0 - 100.0 fL   MCH 32.4 26.0 - 34.0 pg   MCHC 33.2 30.0 - 36.0 g/dL   RDW 17.4 (H) 11.5 - 15.5 %   Platelets 163 150 - 400 K/uL   Neutrophils Relative % 59 %   Neutro Abs 4.0 1.7 - 7.7 K/uL   Lymphocytes Relative 23 %   Lymphs Abs 1.5 0.7 - 4.0 K/uL   Monocytes Relative 11 %   Monocytes Absolute 0.7 0.1 - 1.0 K/uL   Eosinophils Relative 6 %   Eosinophils Absolute 0.4 0.0 - 0.7 K/uL   Basophils Relative 1 %   Basophils Absolute 0.0 0.0 - 0.1 K/uL  Basic metabolic panel     Status: Abnormal   Collection Time: 05/08/17  6:04 AM  Result Value Ref Range   Sodium 131 (L) 135 - 145 mmol/L   Potassium 3.8 3.5 - 5.1 mmol/L   Chloride 91 (L) 101 - 111 mmol/L   CO2 31 22 - 32 mmol/L   Glucose, Bld 74 65 - 99 mg/dL   BUN 17 6 - 20 mg/dL   Creatinine, Ser 2.55 (H) 0.44 - 1.00 mg/dL   Calcium 7.2 (L) 8.9 - 10.3 mg/dL   GFR calc non Af Amer 17 (L) >60 mL/min   GFR calc Af Amer 20 (L) >60 mL/min    Comment: (NOTE) The eGFR has been calculated using the CKD EPI equation. This calculation has not been validated in all clinical situations. eGFR's persistently <60 mL/min signify possible Chronic Kidney Disease.    Anion gap 9 5 - 15  Glucose, capillary     Status: None   Collection Time: 05/08/17  8:17 AM  Result Value Ref Range   Glucose-Capillary 82 65 - 99 mg/dL   Comment 1  Notify RN    Comment 2 Document in Chart     ABGS No results for input(s): PHART, PO2ART, TCO2, HCO3 in the last 72 hours.  Invalid input(s): PCO2 CULTURES Recent Results (from the past 240 hour(s))  Culture, blood (Routine X 2) w Reflex to ID Panel     Status:  None   Collection Time: 04/29/17  8:18 PM  Result Value Ref Range Status   Specimen Description BLOOD RIGHT ARM  Final   Special Requests   Final    BOTTLES DRAWN AEROBIC AND ANAEROBIC Blood Culture adequate volume   Culture NO GROWTH 5 DAYS  Final   Report Status 05/04/2017 FINAL  Final  Culture, blood (Routine X 2) w Reflex to ID Panel     Status: None   Collection Time: 04/29/17  8:26 PM  Result Value Ref Range Status   Specimen Description BLOOD RIGHT ARM  Final   Special Requests   Final    BOTTLES DRAWN AEROBIC AND ANAEROBIC Blood Culture adequate volume   Culture NO GROWTH 5 DAYS  Final   Report Status 05/04/2017 FINAL  Final  MRSA PCR Screening     Status: None   Collection Time: 04/30/17  1:00 AM  Result Value Ref Range Status   MRSA by PCR NEGATIVE NEGATIVE Final    Comment:        The GeneXpert MRSA Assay (FDA approved for NASAL specimens only), is one component of a comprehensive MRSA colonization surveillance program. It is not intended to diagnose MRSA infection nor to guide or monitor treatment for MRSA infections.   C difficile quick scan w PCR reflex     Status: None   Collection Time: 04/30/17  4:55 PM  Result Value Ref Range Status   C Diff antigen NEGATIVE NEGATIVE Final   C Diff toxin NEGATIVE NEGATIVE Final   C Diff interpretation No C. difficile detected.  Final  C difficile quick scan w PCR reflex     Status: None   Collection Time: 05/07/17 10:00 AM  Result Value Ref Range Status   C Diff antigen NEGATIVE NEGATIVE Final   C Diff toxin NEGATIVE NEGATIVE Final   C Diff interpretation No C. difficile detected.  Final   Studies/Results: No results found.  Medications:  Prior to Admission:  Prescriptions Prior to Admission  Medication Sig Dispense Refill Last Dose  . acetaminophen (TYLENOL) 325 MG tablet Take 2 tablets (650 mg total) by mouth every 6 (six) hours as needed for mild pain (or Fever >/= 101).   Past Week at Unknown time  . albuterol  (PROVENTIL HFA;VENTOLIN HFA) 108 (90 Base) MCG/ACT inhaler Inhale 2 puffs into the lungs 4 (four) times daily.    04/29/2017 at Unknown time  . apixaban (ELIQUIS) 5 MG TABS tablet Take 1 tablet (5 mg total) by mouth 2 (two) times daily. 60 tablet 5 04/29/2017 at 1030-1100a  . calcium carbonate (TUMS - DOSED IN MG ELEMENTAL CALCIUM) 500 MG chewable tablet Chew 1 tablet by mouth 2 (two) times daily.   04/29/2017 at Unknown time  . carboxymethylcellulose (REFRESH PLUS) 0.5 % SOLN Apply 1 drop to eye daily as needed (for dry eyes).   Past Week at Unknown time  . cetirizine (ZYRTEC) 10 MG tablet Take 10 mg by mouth daily.   04/29/2017 at Unknown time  . cholecalciferol (VITAMIN D) 1000 units tablet Take 1,000 Units by mouth every morning.   04/29/2017 at Unknown time  . colchicine 0.6 MG tablet Take  1 tablet (0.6 mg total) by mouth daily. 30 tablet 0 04/29/2017 at Unknown time  . diltiazem (CARDIZEM CD) 120 MG 24 hr capsule Take 1 capsule (120 mg total) by mouth daily. 90 capsule 3 04/29/2017 at Unknown time  . escitalopram (LEXAPRO) 20 MG tablet Take 1 tablet (20 mg total) by mouth daily. 30 tablet 12 04/29/2017 at Unknown time  . fluticasone furoate-vilanterol (BREO ELLIPTA) 200-25 MCG/INH AEPB Inhale 1 puff into the lungs daily.   04/29/2017 at Unknown time  . furosemide (LASIX) 20 MG tablet Take 1 tablet (20 mg total) by mouth daily. 30 tablet 0 04/29/2017 at Unknown time  . gabapentin (NEURONTIN) 100 MG capsule Take 2 capsules (200 mg total) by mouth at bedtime. 30 capsule 0 04/28/2017 at Unknown time  . levothyroxine (SYNTHROID, LEVOTHROID) 137 MCG tablet Take 137 mcg by mouth daily before breakfast.   04/29/2017 at Unknown time  . loperamide (IMODIUM) 2 MG capsule Take 1 capsule (2 mg total) by mouth every morning. (Patient taking differently: Take 2 mg by mouth every morning. *May take up to 4 times daily as needed for loose stools and diarrhea) 90 capsule 5 Past Week at Unknown time  . metoprolol tartrate (LOPRESSOR) 25  MG tablet TAKE 1/2 TABLET BY MOUTH TWICE DAILY 90 tablet 1 04/29/2017 at 1030-1100a  . omeprazole (PRILOSEC) 20 MG capsule TAKE ONE CAPSULE BY MOUTH DAILY 90 capsule 3 04/29/2017 at Unknown time  . Pediatric Multivitamins-Iron (FLINTSTONES PLUS IRON) chewable tablet Chew 1 tablet by mouth 2 (two) times daily.   04/29/2017 at Unknown time  . potassium chloride SA (K-DUR,KLOR-CON) 20 MEQ tablet Take 1 tablet (20 mEq total) by mouth daily. 30 tablet 0 04/29/2017 at Unknown time  . sodium bicarbonate 650 MG tablet Take 1 tablet (650 mg total) by mouth 2 (two) times daily. 60 tablet 0 04/29/2017 at Unknown time  . triamcinolone (NASACORT ALLERGY 24HR) 55 MCG/ACT AERO nasal inhaler Place 2 sprays into the nose daily.   unknown  . vancomycin (VANCOCIN) 50 mg/mL oral solution Take 2.5 mLs (125 mg total) by mouth 2 (two) times daily. 70 mL 0 04/28/2017 at Unknown time  . allopurinol (ZYLOPRIM) 100 MG tablet Take 150 mg by mouth daily.  0   . Amino Acids-Protein Hydrolys (FEEDING SUPPLEMENT, PRO-STAT SUGAR FREE 64,) LIQD Take 30 mLs by mouth 2 (two) times daily. (Patient not taking: Reported on 04/29/2017) 900 mL 0 Not Taking at Unknown time  . Doxepin HCl (SILENOR) 6 MG TABS Take 1 tablet (6 mg total) by mouth at bedtime as needed (for sleep). (Patient not taking: Reported on 04/29/2017) 10 tablet 0 Not Taking at Unknown time  . insulin glargine (LANTUS) 100 UNIT/ML injection Inject 0.2 mLs (20 Units total) into the skin daily. (Patient not taking: Reported on 04/29/2017) 10 mL 11 Not Taking at Unknown time  . ondansetron (ZOFRAN-ODT) 4 MG disintegrating tablet Take 1 tablet (4 mg total) by mouth 3 (three) times daily before meals. (Patient not taking: Reported on 04/29/2017) 20 tablet 0 Not Taking at Unknown time   Scheduled: . albuterol  3 mL Inhalation TID  . allopurinol  150 mg Oral Daily  . apixaban  5 mg Oral BID  . calcium carbonate  1 tablet Oral BID  . cholecalciferol  1,000 Units Oral q morning - 10a  . colchicine   0.6 mg Oral Q48H  . diltiazem  120 mg Oral Daily  . doxycycline  100 mg Oral Q12H  . escitalopram  20  mg Oral Daily  . feeding supplement  1 Container Oral TID BM  . feeding supplement (PRO-STAT SUGAR FREE 64)  30 mL Oral BID  . fluticasone  2 spray Each Nare Daily  . fluticasone furoate-vilanterol  1 puff Inhalation Daily  . gabapentin  200 mg Oral QHS  . insulin aspart  0-9 Units Subcutaneous TID WC  . levothyroxine  137 mcg Oral QAC breakfast  . loperamide  2 mg Oral BID AC  . metoprolol tartrate  12.5 mg Oral BID  . multivitamin with minerals  1 tablet Oral BID  . ondansetron  4 mg Oral TID AC & HS  . sodium bicarbonate  650 mg Oral BID  . sodium chloride flush  3 mL Intravenous Q12H   Continuous: . sodium chloride    . sodium chloride 50 mL/hr at 05/07/17 2219   SLP:NPYYFR chloride, acetaminophen **OR** acetaminophen, acetaminophen, ondansetron (ZOFRAN) IV, polyvinyl alcohol, sodium chloride flush  Assesment: She was admitted with cellulitis of the abdominal wall. She also had cellulitis of her left arm. Both of these appear to be improving. At baseline she has chronic diastolic heart failure and she had significantly more edema and was placed on diuresis. She has developed acute kidney injury likely related to that. This has been a recurrent problem with her developing acute diastolic heart failure and then when that is treated developing acute kidney injury. I think are going to have to be satisfied with a creatinine somewhere around 2 and with her having some fluid on board. Her creatinine has gone up a little bit again today despite receiving IV fluids yesterday but she looks and feels much better today. Principal Problem:   Cellulitis Active Problems:   HTN (hypertension)   COPD (chronic obstructive pulmonary disease) (HCC)   PAF (paroxysmal atrial fibrillation) (HCC)   Chronic anticoagulation   Chronic edema   Obesity   Diabetes mellitus type 2 in obese (HCC)   Chronic  diastolic CHF (congestive heart failure) ([place])    Plan: Continue treatments. Continue IV fluids. Not ready for discharge    LOS: 9 days   Kody Vigil L 05/08/2017, 8:44 AM

## 2017-05-08 NOTE — Clinical Social Work Note (Signed)
LCSW discussed with patient that her sister Lourdes Sledge(Louise Price)  had left a message regarding patient's need for placement. Patient stated that she would speak with her sister and then get back with LCSW.      Therin Vetsch, Juleen ChinaHeather D, LCSW

## 2017-05-09 DIAGNOSIS — R7989 Other specified abnormal findings of blood chemistry: Secondary | ICD-10-CM

## 2017-05-09 DIAGNOSIS — D638 Anemia in other chronic diseases classified elsewhere: Secondary | ICD-10-CM

## 2017-05-09 DIAGNOSIS — L0390 Cellulitis, unspecified: Secondary | ICD-10-CM

## 2017-05-09 DIAGNOSIS — R197 Diarrhea, unspecified: Secondary | ICD-10-CM

## 2017-05-09 LAB — BASIC METABOLIC PANEL
Anion gap: 10 (ref 5–15)
BUN: 18 mg/dL (ref 6–20)
CHLORIDE: 89 mmol/L — AB (ref 101–111)
CO2: 31 mmol/L (ref 22–32)
CREATININE: 2.81 mg/dL — AB (ref 0.44–1.00)
Calcium: 7.2 mg/dL — ABNORMAL LOW (ref 8.9–10.3)
GFR calc Af Amer: 18 mL/min — ABNORMAL LOW (ref >60)
GFR, EST NON AFRICAN AMERICAN: 16 mL/min — AB (ref >60)
GLUCOSE: 83 mg/dL (ref 65–99)
POTASSIUM: 3.7 mmol/L (ref 3.5–5.1)
Sodium: 130 mmol/L — ABNORMAL LOW (ref 135–145)

## 2017-05-09 LAB — CBC WITH DIFFERENTIAL/PLATELET
Basophils Absolute: 0 10*3/uL (ref 0.0–0.1)
Basophils Relative: 0 %
EOS PCT: 8 %
Eosinophils Absolute: 0.5 10*3/uL (ref 0.0–0.7)
HCT: 24 % — ABNORMAL LOW (ref 36.0–46.0)
Hemoglobin: 8.1 g/dL — ABNORMAL LOW (ref 12.0–15.0)
LYMPHS ABS: 1.6 10*3/uL (ref 0.7–4.0)
LYMPHS PCT: 25 %
MCH: 32.8 pg (ref 26.0–34.0)
MCHC: 33.8 g/dL (ref 30.0–36.0)
MCV: 97.2 fL (ref 78.0–100.0)
MONOS PCT: 9 %
Monocytes Absolute: 0.6 10*3/uL (ref 0.1–1.0)
Neutro Abs: 3.8 10*3/uL (ref 1.7–7.7)
Neutrophils Relative %: 58 %
PLATELETS: 173 10*3/uL (ref 150–400)
RBC: 2.47 MIL/uL — ABNORMAL LOW (ref 3.87–5.11)
RDW: 16.9 % — ABNORMAL HIGH (ref 11.5–15.5)
WBC: 6.5 10*3/uL (ref 4.0–10.5)

## 2017-05-09 LAB — HEPATIC FUNCTION PANEL
ALK PHOS: 128 U/L — AB (ref 38–126)
ALT: 25 U/L (ref 14–54)
AST: 46 U/L — ABNORMAL HIGH (ref 15–41)
Albumin: 2 g/dL — ABNORMAL LOW (ref 3.5–5.0)
BILIRUBIN DIRECT: 1 mg/dL — AB (ref 0.1–0.5)
BILIRUBIN INDIRECT: 1 mg/dL — AB (ref 0.3–0.9)
Total Bilirubin: 2 mg/dL — ABNORMAL HIGH (ref 0.3–1.2)
Total Protein: 4.5 g/dL — ABNORMAL LOW (ref 6.5–8.1)

## 2017-05-09 LAB — GLUCOSE, CAPILLARY
GLUCOSE-CAPILLARY: 112 mg/dL — AB (ref 65–99)
GLUCOSE-CAPILLARY: 104 mg/dL — AB (ref 65–99)
Glucose-Capillary: 81 mg/dL (ref 65–99)
Glucose-Capillary: 93 mg/dL (ref 65–99)

## 2017-05-09 MED ORDER — ALBUMIN HUMAN 25 % IV SOLN
12.5000 g | Freq: Four times a day (QID) | INTRAVENOUS | Status: DC
  Administered 2017-05-09 – 2017-05-10 (×3): 12.5 g via INTRAVENOUS
  Filled 2017-05-09 (×8): qty 50

## 2017-05-09 MED ORDER — PANTOPRAZOLE SODIUM 40 MG PO TBEC
40.0000 mg | DELAYED_RELEASE_TABLET | Freq: Every day | ORAL | Status: DC
  Administered 2017-05-10 – 2017-05-13 (×4): 40 mg via ORAL
  Filled 2017-05-09 (×4): qty 1

## 2017-05-09 MED ORDER — PANTOPRAZOLE SODIUM 40 MG IV SOLR
40.0000 mg | Freq: Once | INTRAVENOUS | Status: AC
  Administered 2017-05-09: 40 mg via INTRAVENOUS
  Filled 2017-05-09: qty 40

## 2017-05-09 MED ORDER — METOCLOPRAMIDE HCL 5 MG/ML IJ SOLN
5.0000 mg | Freq: Four times a day (QID) | INTRAMUSCULAR | Status: DC
  Administered 2017-05-09 – 2017-05-11 (×7): 5 mg via INTRAVENOUS
  Filled 2017-05-09 (×7): qty 2

## 2017-05-09 MED ORDER — ONDANSETRON HCL 4 MG PO TABS
8.0000 mg | ORAL_TABLET | Freq: Three times a day (TID) | ORAL | Status: DC
  Administered 2017-05-09 – 2017-05-13 (×18): 8 mg via ORAL
  Filled 2017-05-09 (×19): qty 2

## 2017-05-09 NOTE — Care Management Note (Signed)
Case Management Note  Patient Details  Name: Ashley MurrainBrenda W Ferrington MRN: [id number] Date of Birth: [birth year]  If discussed at Long Length of Stay Meetings, dates discussed:  05/09/2017   Malcolm MetroChild[place], [place], RN 05/09/2017, 12:55 PM

## 2017-05-09 NOTE — Progress Notes (Signed)
Subjective: She says she feels okay but she is actively vomiting now. No other new complaints.  Objective: Vital signs in last 24 hours: Temp:  [98.4 F (36.9 C)-98.7 F (37.1 C)] 98.4 F (36.9 C) (08/16 0447) Pulse Rate:  [82-95] 82 (08/16 0447) Resp:  [20] 20 (08/15 2118) BP: (106-112)/(44-45) 106/45 (08/16 0447) SpO2:  [85 %-97 %] 97 % (08/16 0447) Weight change:  Last BM Date: 05/08/17  Intake/Output from previous day: No intake/output data recorded.  PHYSICAL EXAM General appearance: alert, cooperative and mild distress Resp: clear to auscultation bilaterally Cardio: regular rate and rhythm, S1, S2 normal, no murmur, click, rub or gallop GI: The cellulitis of her abdominal wall is better Extremities: Cellulitis of her left arm is better but she still has diffuse edema Mucous membranes are moist  Lab Results:  Results for orders placed or performed during the hospital encounter of 04/29/17 (from the past 48 hour(s))  Basic metabolic panel     Status: Abnormal   Collection Time: 05/07/17  9:17 AM  Result Value Ref Range   Sodium 129 (L) 135 - 145 mmol/L   Potassium 3.8 3.5 - 5.1 mmol/L   Chloride 87 (L) 101 - 111 mmol/L   CO2 31 22 - 32 mmol/L   Glucose, Bld 93 65 - 99 mg/dL   BUN 16 6 - 20 mg/dL   Creatinine, Ser 2.51 (H) 0.44 - 1.00 mg/dL   Calcium 7.4 (L) 8.9 - 10.3 mg/dL   GFR calc non Af Amer 18 (L) >60 mL/min   GFR calc Af Amer 21 (L) >60 mL/min    Comment: (NOTE) The eGFR has been calculated using the CKD EPI equation. This calculation has not been validated in all clinical situations. eGFR's persistently <60 mL/min signify possible Chronic Kidney Disease.    Anion gap 11 5 - 15  CBC with Differential/Platelet     Status: Abnormal   Collection Time: 05/07/17  9:17 AM  Result Value Ref Range   WBC 8.2 4.0 - 10.5 K/uL   RBC 2.57 (L) 3.87 - 5.11 MIL/uL   Hemoglobin 8.4 (L) 12.0 - 15.0 g/dL   HCT 25.0 (L) 36.0 - 46.0 %   MCV 97.3 78.0 - 100.0 fL   MCH  32.7 26.0 - 34.0 pg   MCHC 33.6 30.0 - 36.0 g/dL   RDW 17.5 (H) 11.5 - 15.5 %   Platelets 173 150 - 400 K/uL   Neutrophils Relative % 70 %   Neutro Abs 5.7 1.7 - 7.7 K/uL   Lymphocytes Relative 16 %   Lymphs Abs 1.3 0.7 - 4.0 K/uL   Monocytes Relative 10 %   Monocytes Absolute 0.9 0.1 - 1.0 K/uL   Eosinophils Relative 4 %   Eosinophils Absolute 0.3 0.0 - 0.7 K/uL   Basophils Relative 0 %   Basophils Absolute 0.0 0.0 - 0.1 K/uL  C difficile quick scan w PCR reflex     Status: None   Collection Time: 05/07/17 10:00 AM  Result Value Ref Range   C Diff antigen NEGATIVE NEGATIVE   C Diff toxin NEGATIVE NEGATIVE   C Diff interpretation No C. difficile detected.   Glucose, capillary     Status: None   Collection Time: 05/07/17 11:28 AM  Result Value Ref Range   Glucose-Capillary 95 65 - 99 mg/dL   Comment 1 Notify RN    Comment 2 Document in Chart   Glucose, capillary     Status: None   Collection Time: 05/07/17  4:33 PM  Result Value Ref Range   Glucose-Capillary 99 65 - 99 mg/dL  Glucose, capillary     Status: None   Collection Time: 05/07/17  9:15 PM  Result Value Ref Range   Glucose-Capillary 98 65 - 99 mg/dL   Comment 1 Notify RN    Comment 2 Document in Chart   CBC with Differential/Platelet     Status: Abnormal   Collection Time: 05/08/17  6:04 AM  Result Value Ref Range   WBC 6.7 4.0 - 10.5 K/uL   RBC 2.38 (L) 3.87 - 5.11 MIL/uL   Hemoglobin 7.7 (L) 12.0 - 15.0 g/dL   HCT 37.7 (L) 80.7 - 81.2 %   MCV 97.5 78.0 - 100.0 fL   MCH 32.4 26.0 - 34.0 pg   MCHC 33.2 30.0 - 36.0 g/dL   RDW 78.1 (H) 08.6 - 07.6 %   Platelets 163 150 - 400 K/uL   Neutrophils Relative % 59 %   Neutro Abs 4.0 1.7 - 7.7 K/uL   Lymphocytes Relative 23 %   Lymphs Abs 1.5 0.7 - 4.0 K/uL   Monocytes Relative 11 %   Monocytes Absolute 0.7 0.1 - 1.0 K/uL   Eosinophils Relative 6 %   Eosinophils Absolute 0.4 0.0 - 0.7 K/uL   Basophils Relative 1 %   Basophils Absolute 0.0 0.0 - 0.1 K/uL  Basic  metabolic panel     Status: Abnormal   Collection Time: 05/08/17  6:04 AM  Result Value Ref Range   Sodium 131 (L) 135 - 145 mmol/L   Potassium 3.8 3.5 - 5.1 mmol/L   Chloride 91 (L) 101 - 111 mmol/L   CO2 31 22 - 32 mmol/L   Glucose, Bld 74 65 - 99 mg/dL   BUN 17 6 - 20 mg/dL   Creatinine, Ser 3.77 (H) 0.44 - 1.00 mg/dL   Calcium 7.2 (L) 8.9 - 10.3 mg/dL   GFR calc non Af Amer 17 (L) >60 mL/min   GFR calc Af Amer 20 (L) >60 mL/min    Comment: (NOTE) The eGFR has been calculated using the CKD EPI equation. This calculation has not been validated in all clinical situations. eGFR's persistently <60 mL/min signify possible Chronic Kidney Disease.    Anion gap 9 5 - 15  Glucose, capillary     Status: None   Collection Time: 05/08/17  8:17 AM  Result Value Ref Range   Glucose-Capillary 82 65 - 99 mg/dL   Comment 1 Notify RN    Comment 2 Document in Chart   Gastrointestinal Panel by PCR , Stool     Status: None   Collection Time: 05/08/17 10:51 AM  Result Value Ref Range   Campylobacter species NOT DETECTED NOT DETECTED   Plesimonas shigelloides NOT DETECTED NOT DETECTED   Salmonella species NOT DETECTED NOT DETECTED   Yersinia enterocolitica NOT DETECTED NOT DETECTED   Vibrio species NOT DETECTED NOT DETECTED   Vibrio cholerae NOT DETECTED NOT DETECTED   Enteroaggregative E coli (EAEC) NOT DETECTED NOT DETECTED   Enteropathogenic E coli (EPEC) NOT DETECTED NOT DETECTED   Enterotoxigenic E coli (ETEC) NOT DETECTED NOT DETECTED   Shiga like toxin producing E coli (STEC) NOT DETECTED NOT DETECTED   Shigella/Enteroinvasive E coli (EIEC) NOT DETECTED NOT DETECTED   Cryptosporidium NOT DETECTED NOT DETECTED   Cyclospora cayetanensis NOT DETECTED NOT DETECTED   Entamoeba histolytica NOT DETECTED NOT DETECTED   Giardia lamblia NOT DETECTED NOT DETECTED   Adenovirus F40/41 NOT  DETECTED NOT DETECTED   Astrovirus NOT DETECTED NOT DETECTED   Norovirus GI/GII NOT DETECTED NOT DETECTED    Rotavirus A NOT DETECTED NOT DETECTED   Sapovirus (I, II, IV, and V) NOT DETECTED NOT DETECTED  Glucose, capillary     Status: Abnormal   Collection Time: 05/08/17 12:03 PM  Result Value Ref Range   Glucose-Capillary 111 (H) 65 - 99 mg/dL   Comment 1 Notify RN    Comment 2 Document in Chart   Glucose, capillary     Status: None   Collection Time: 05/08/17  5:05 PM  Result Value Ref Range   Glucose-Capillary 89 65 - 99 mg/dL   Comment 1 Notify RN    Comment 2 Document in Chart   Glucose, capillary     Status: None   Collection Time: 05/08/17  9:20 PM  Result Value Ref Range   Glucose-Capillary 99 65 - 99 mg/dL  Glucose, capillary     Status: None   Collection Time: 05/09/17  7:20 AM  Result Value Ref Range   Glucose-Capillary 81 65 - 99 mg/dL   Comment 1 Notify RN    Comment 2 Document in Chart   CBC with Differential/Platelet     Status: Abnormal   Collection Time: 05/09/17  8:12 AM  Result Value Ref Range   WBC 6.5 4.0 - 10.5 K/uL   RBC 2.47 (L) 3.87 - 5.11 MIL/uL   Hemoglobin 8.1 (L) 12.0 - 15.0 g/dL   HCT 24.0 (L) 36.0 - 46.0 %   MCV 97.2 78.0 - 100.0 fL   MCH 32.8 26.0 - 34.0 pg   MCHC 33.8 30.0 - 36.0 g/dL   RDW 16.9 (H) 11.5 - 15.5 %   Platelets 173 150 - 400 K/uL   Neutrophils Relative % 58 %   Neutro Abs 3.8 1.7 - 7.7 K/uL   Lymphocytes Relative 25 %   Lymphs Abs 1.6 0.7 - 4.0 K/uL   Monocytes Relative 9 %   Monocytes Absolute 0.6 0.1 - 1.0 K/uL   Eosinophils Relative 8 %   Eosinophils Absolute 0.5 0.0 - 0.7 K/uL   Basophils Relative 0 %   Basophils Absolute 0.0 0.0 - 0.1 K/uL    ABGS No results for input(s): PHART, PO2ART, TCO2, HCO3 in the last 72 hours.  Invalid input(s): PCO2 CULTURES Recent Results (from the past 240 hour(s))  Culture, blood (Routine X 2) w Reflex to ID Panel     Status: None   Collection Time: 04/29/17  8:18 PM  Result Value Ref Range Status   Specimen Description BLOOD RIGHT ARM  Final   Special Requests   Final    BOTTLES  DRAWN AEROBIC AND ANAEROBIC Blood Culture adequate volume   Culture NO GROWTH 5 DAYS  Final   Report Status 05/04/2017 FINAL  Final  Culture, blood (Routine X 2) w Reflex to ID Panel     Status: None   Collection Time: 04/29/17  8:26 PM  Result Value Ref Range Status   Specimen Description BLOOD RIGHT ARM  Final   Special Requests   Final    BOTTLES DRAWN AEROBIC AND ANAEROBIC Blood Culture adequate volume   Culture NO GROWTH 5 DAYS  Final   Report Status 05/04/2017 FINAL  Final  MRSA PCR Screening     Status: None   Collection Time: 04/30/17  1:00 AM  Result Value Ref Range Status   MRSA by PCR NEGATIVE NEGATIVE Final    Comment:  The GeneXpert MRSA Assay (FDA approved for NASAL specimens only), is one component of a comprehensive MRSA colonization surveillance program. It is not intended to diagnose MRSA infection nor to guide or monitor treatment for MRSA infections.   C difficile quick scan w PCR reflex     Status: None   Collection Time: 04/30/17  4:55 PM  Result Value Ref Range Status   C Diff antigen NEGATIVE NEGATIVE Final   C Diff toxin NEGATIVE NEGATIVE Final   C Diff interpretation No C. difficile detected.  Final  C difficile quick scan w PCR reflex     Status: None   Collection Time: 05/07/17 10:00 AM  Result Value Ref Range Status   C Diff antigen NEGATIVE NEGATIVE Final   C Diff toxin NEGATIVE NEGATIVE Final   C Diff interpretation No C. difficile detected.  Final  Gastrointestinal Panel by PCR , Stool     Status: None   Collection Time: 05/08/17 10:51 AM  Result Value Ref Range Status   Campylobacter species NOT DETECTED NOT DETECTED Final   Plesimonas shigelloides NOT DETECTED NOT DETECTED Final   Salmonella species NOT DETECTED NOT DETECTED Final   Yersinia enterocolitica NOT DETECTED NOT DETECTED Final   Vibrio species NOT DETECTED NOT DETECTED Final   Vibrio cholerae NOT DETECTED NOT DETECTED Final   Enteroaggregative E coli (EAEC) NOT DETECTED  NOT DETECTED Final   Enteropathogenic E coli (EPEC) NOT DETECTED NOT DETECTED Final   Enterotoxigenic E coli (ETEC) NOT DETECTED NOT DETECTED Final   Shiga like toxin producing E coli (STEC) NOT DETECTED NOT DETECTED Final   Shigella/Enteroinvasive E coli (EIEC) NOT DETECTED NOT DETECTED Final   Cryptosporidium NOT DETECTED NOT DETECTED Final   Cyclospora cayetanensis NOT DETECTED NOT DETECTED Final   Entamoeba histolytica NOT DETECTED NOT DETECTED Final   Giardia lamblia NOT DETECTED NOT DETECTED Final   Adenovirus F40/41 NOT DETECTED NOT DETECTED Final   Astrovirus NOT DETECTED NOT DETECTED Final   Norovirus GI/GII NOT DETECTED NOT DETECTED Final   Rotavirus A NOT DETECTED NOT DETECTED Final   Sapovirus (I, II, IV, and V) NOT DETECTED NOT DETECTED Final   Studies/Results: No results found.  Medications:  Prior to Admission:  Prescriptions Prior to Admission  Medication Sig Dispense Refill Last Dose  . acetaminophen (TYLENOL) 325 MG tablet Take 2 tablets (650 mg total) by mouth every 6 (six) hours as needed for mild pain (or Fever >/= 101).   Past Week at Unknown time  . albuterol (PROVENTIL HFA;VENTOLIN HFA) 108 (90 Base) MCG/ACT inhaler Inhale 2 puffs into the lungs 4 (four) times daily.    04/29/2017 at Unknown time  . apixaban (ELIQUIS) 5 MG TABS tablet Take 1 tablet (5 mg total) by mouth 2 (two) times daily. 60 tablet 5 04/29/2017 at 1030-1100a  . calcium carbonate (TUMS - DOSED IN MG ELEMENTAL CALCIUM) 500 MG chewable tablet Chew 1 tablet by mouth 2 (two) times daily.   04/29/2017 at Unknown time  . carboxymethylcellulose (REFRESH PLUS) 0.5 % SOLN Apply 1 drop to eye daily as needed (for dry eyes).   Past Week at Unknown time  . cetirizine (ZYRTEC) 10 MG tablet Take 10 mg by mouth daily.   04/29/2017 at Unknown time  . cholecalciferol (VITAMIN D) 1000 units tablet Take 1,000 Units by mouth every morning.   04/29/2017 at Unknown time  . colchicine 0.6 MG tablet Take 1 tablet (0.6 mg total)  by mouth daily. 30 tablet 0 04/29/2017 at Unknown  time  . diltiazem (CARDIZEM CD) 120 MG 24 hr capsule Take 1 capsule (120 mg total) by mouth daily. 90 capsule 3 04/29/2017 at Unknown time  . escitalopram (LEXAPRO) 20 MG tablet Take 1 tablet (20 mg total) by mouth daily. 30 tablet 12 04/29/2017 at Unknown time  . fluticasone furoate-vilanterol (BREO ELLIPTA) 200-25 MCG/INH AEPB Inhale 1 puff into the lungs daily.   04/29/2017 at Unknown time  . furosemide (LASIX) 20 MG tablet Take 1 tablet (20 mg total) by mouth daily. 30 tablet 0 04/29/2017 at Unknown time  . gabapentin (NEURONTIN) 100 MG capsule Take 2 capsules (200 mg total) by mouth at bedtime. 30 capsule 0 04/28/2017 at Unknown time  . levothyroxine (SYNTHROID, LEVOTHROID) 137 MCG tablet Take 137 mcg by mouth daily before breakfast.   04/29/2017 at Unknown time  . loperamide (IMODIUM) 2 MG capsule Take 1 capsule (2 mg total) by mouth every morning. (Patient taking differently: Take 2 mg by mouth every morning. *May take up to 4 times daily as needed for loose stools and diarrhea) 90 capsule 5 Past Week at Unknown time  . metoprolol tartrate (LOPRESSOR) 25 MG tablet TAKE 1/2 TABLET BY MOUTH TWICE DAILY 90 tablet 1 04/29/2017 at 1030-1100a  . omeprazole (PRILOSEC) 20 MG capsule TAKE ONE CAPSULE BY MOUTH DAILY 90 capsule 3 04/29/2017 at Unknown time  . Pediatric Multivitamins-Iron (FLINTSTONES PLUS IRON) chewable tablet Chew 1 tablet by mouth 2 (two) times daily.   04/29/2017 at Unknown time  . potassium chloride SA (K-DUR,KLOR-CON) 20 MEQ tablet Take 1 tablet (20 mEq total) by mouth daily. 30 tablet 0 04/29/2017 at Unknown time  . sodium bicarbonate 650 MG tablet Take 1 tablet (650 mg total) by mouth 2 (two) times daily. 60 tablet 0 04/29/2017 at Unknown time  . triamcinolone (NASACORT ALLERGY 24HR) 55 MCG/ACT AERO nasal inhaler Place 2 sprays into the nose daily.   unknown  . vancomycin (VANCOCIN) 50 mg/mL oral solution Take 2.5 mLs (125 mg total) by mouth 2 (two) times  daily. 70 mL 0 04/28/2017 at Unknown time  . allopurinol (ZYLOPRIM) 100 MG tablet Take 150 mg by mouth daily.  0   . Amino Acids-Protein Hydrolys (FEEDING SUPPLEMENT, PRO-STAT SUGAR FREE 64,) LIQD Take 30 mLs by mouth 2 (two) times daily. (Patient not taking: Reported on 04/29/2017) 900 mL 0 Not Taking at Unknown time  . Doxepin HCl (SILENOR) 6 MG TABS Take 1 tablet (6 mg total) by mouth at bedtime as needed (for sleep). (Patient not taking: Reported on 04/29/2017) 10 tablet 0 Not Taking at Unknown time  . insulin glargine (LANTUS) 100 UNIT/ML injection Inject 0.2 mLs (20 Units total) into the skin daily. (Patient not taking: Reported on 04/29/2017) 10 mL 11 Not Taking at Unknown time  . ondansetron (ZOFRAN-ODT) 4 MG disintegrating tablet Take 1 tablet (4 mg total) by mouth 3 (three) times daily before meals. (Patient not taking: Reported on 04/29/2017) 20 tablet 0 Not Taking at Unknown time   Scheduled: . albuterol  3 mL Inhalation TID  . allopurinol  150 mg Oral Daily  . apixaban  5 mg Oral BID  . calcium carbonate  1 tablet Oral BID  . cholecalciferol  1,000 Units Oral q morning - 10a  . colchicine  0.6 mg Oral Q48H  . diltiazem  120 mg Oral Daily  . doxycycline  100 mg Oral Q12H  . escitalopram  20 mg Oral Daily  . feeding supplement  1 Container Oral TID BM  .  feeding supplement (PRO-STAT SUGAR FREE 64)  30 mL Oral BID  . fluticasone  2 spray Each Nare Daily  . fluticasone furoate-vilanterol  1 puff Inhalation Daily  . gabapentin  200 mg Oral QHS  . insulin aspart  0-9 Units Subcutaneous TID WC  . levothyroxine  137 mcg Oral QAC breakfast  . loperamide  2 mg Oral BID AC  . metoprolol tartrate  12.5 mg Oral BID  . multivitamin with minerals  1 tablet Oral BID  . ondansetron  8 mg Oral TID AC & HS  . sodium bicarbonate  650 mg Oral BID  . sodium chloride flush  3 mL Intravenous Q12H   Continuous: . sodium chloride    . sodium chloride 50 mL/hr at 05/07/17 2219   FHQ:RFXJOI chloride,  acetaminophen **OR** acetaminophen, acetaminophen, ondansetron (ZOFRAN) IV, polyvinyl alcohol, sodium chloride flush  Assesment: She is admitted with cellulitis of the abdominal wall. She has multiple other medical problems including malnutrition. Agree she may benefit from IV albumin. Especially now that she's still having problems with nausea and vomiting and not really able to get in food which had been better earlier during her hospitalization Principal Problem:   Cellulitis Active Problems:   HTN (hypertension)   COPD (chronic obstructive pulmonary disease) (HCC)   PAF (paroxysmal atrial fibrillation) (HCC)   Chronic anticoagulation   Chronic edema   Obesity   Diabetes mellitus type 2 in obese (HCC)   Chronic diastolic CHF (congestive heart failure) ([place])    Plan: Blood work is pending this morning. Depending on results*IV albumin perhaps with Lasix afterwards although I want to be sure about her renal function before I order the Lasix    LOS: 10 days   Nonna Renninger L 05/09/2017, 8:43 AM

## 2017-05-09 NOTE — Progress Notes (Signed)
Nutrition Follow -up     INTERVENTION:  Continue Boost Breeze po BID and ProStat 30 ml TID  MVI daily   Recommend daily weights / obtain standing wt (if pt is able to stand)  Consider trial of appetite stimulant   Place food in individual bowls to manage intensity of food smells  NUTRITION DIAGNOSIS:    Inadequate oral intake related to poor appetite as evidenced by meal intake <50%  GOAL:  Pt to meet >/= 90% of their estimated nutrition needs      MONITOR:  Po intake, labs and wt trends     REASON FOR ASSESSMENT:   Consult COPD Protocol  ASSESSMENT:  Patient is a 76 yo with multiple comorbid conditions. Hx includes CKD-3, CHF, Dysphagia, Barrett esophagus, cellulitis (BLE), COPD and GERD. She presents with right flank pain and redness. She has anasarca, fatty liver (? Cirrhosis) and anemia per GI note.  Persisting poor appetite and c/o nausea with the smell of foods. According to the patient and nutrition staff  she has only taken a few bites of food (fruit) today. She did drink (75%) of a Boost Breeze last night according to pt. Trial of appetite stimulant my help?? Weight hx: Her weight 89.4 kg on 8/7. Unable to assess change at this time.  .  Recent Labs Lab 05/07/17 0917 05/08/17 0604 05/09/17 0812  NA 129* 131* 130*  K 3.8 3.8 3.7  CL 87* 91* 89*  CO2 31 31 31   BUN 16 17 18   CREATININE 2.51* 2.55* 2.81*  CALCIUM 7.4* 7.2* 7.2*  GLUCOSE 93 74 83    Labs: Sodium 130, Cr 2.81 (GFR-16) and ALBUMIN 2.0 (total protein 4.5) - H/H- 8.1/ 24% (  Meds: TUMS, Vitmain D, Cholchicine, SSI  Diet Order:  Diet heart healthy/carb modified Room service appropriate? Yes; Fluid consistency: Thin  Skin: redness and warmth to BLE  Last BM:   8/15- loose stools improved - c.diff antigen and toxin negative   Height:   Ht Readings from Last 1 Encounters:  04/29/17 5\' 1"  (1.549 m)    Weight:   Wt Readings from Last 1 Encounters:  04/30/17 197 lb 1.5 oz (89.4 kg)   Admit wt-   Ideal Body Weight:   48 kg  BMI:  Body mass index is 37.24 kg/m.  Estimated Nutritional Needs:   Kcal:  1600-1800 (19-21 kcal/kg) (Based on Adj wt)  Protein:  69-77 gr (0.8-0.9 gr/kg) (CKD-3)  Fluid:  1.6-1.8 liters daily  EDUCATION NEEDS: talked about the importance of nutrition intake especially protein. Asked pt if she would consider alternate means of nutrition support if her appetite continued to be poor.   Royann ShiversLynn Radin Raptis MS,RD,CSG,LDN Office: [phone] Pager: [phone]

## 2017-05-09 NOTE — Progress Notes (Signed)
  Subjective:  Patient states diarrhea has resolved. She had one bowel movement today. However she vomited. She denies hematemesis or melena. She feels abdominal wall swelling has decreased since yesterday. She tries to lie more on left lateral recumbent position.   Objective:  Blood pressure (!) 115/51, pulse 87, temperature 98.6 F (37 C), temperature source Oral, resp. rate 18, height 5\' 1"  (1.549 m), weight 197 lb 1.5 oz (89.4 kg), SpO2 95 %. Patient is alert and in no acute distress. Conjunctiva is pale. Abdomen is full but pitting edema to lower abdominal wall more in right low quadrant and flank than on the left side. Skin is less erythematous yesterday. Pitting edema noted to upper and lower extremities. No LE edema or clubbing noted.  Labs/studies Results:   Recent Labs  05/07/17 0917 05/08/17 0604 05/09/17 0812  WBC 8.2 6.7 6.5  HGB 8.4* 7.7* 8.1*  HCT 25.0* 23.2* 24.0*  PLT 173 163 173    BMET   Recent Labs  05/07/17 0917 05/08/17 0604 05/09/17 0812  NA 129* 131* 130*  K 3.8 3.8 3.7  CL 87* 91* 89*  CO2 31 31 31   GLUCOSE 93 74 83  BUN 16 17 18   CREATININE 2.51* 2.55* 2.81*  CALCIUM 7.4* 7.2* 7.2*    LFT   Recent Labs  05/09/17 0812  PROT 4.5*  ALBUMIN 2.0*  AST 46*  ALT 25  ALKPHOS 128*  BILITOT 2.0*  BILIDIR 1.0*  IBILI 1.0*    GI pathogen panel is pending. Stool guaiac pending.  Assessment:  #1. Anasarca complicated by lower abdominal wall cellulitis. Serum albumin is very low. Discussed with Dr. Juanetta GoslingHawkins earlier today and patient received first dose of albumin infusion which should help.  #2. Postprandial nausea and vomiting. 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 She will benefit from promotility agent. If she is not able to tolerate metoclopramide will consider erythromycin. She also has chronic GERD and needs to be back on PPI.  #3. Diarrhea. She has history of C. difficile colitis with 3 consecutive stool studies are negative. GI  pathogen panel is also negative. She has responded to symptomatic therapy with loperamide. Dose may have to be decreased if she were to become constipated.   #4. Anemia. She has history of iron deficiency anemia but iron studies 2 months ago were normal. Hemoccult is pending. Suspect anemia primarily due to chronic disease.  #5. Liver disease. She has quite an impressive fatty change on CT. Liver contour does not suggest advanced cirrhosis. Low serum albumin felt to be multifactorial and not solely on the basis of liver disease.  Recommendations:  Metoclopramide 5 mg IV every 6 hours. Unless she experiences side effects she will be switched to oral route within the next 24-48 hours. Pantoprazole 40 mg IV once and that after by mouth.  Dr. Jena Gaussourk will be seen patient starting 05/10/2017.

## 2017-05-09 NOTE — Progress Notes (Signed)
Patient ID: Yolanda [name], female   DOB: 1940/10/11, 76 y.o.   MRN: [id number] States BMs are better. Has had 2 stools so far today. Stools are not as runny. Two-Three stools yesterday.  States feels better.  (50% better).  GI pathogen is negative.  Will continue to follow.

## 2017-05-09 NOTE — Clinical Social Work Note (Signed)
LCSW received a message that patient wanted LCSW to contact her sister Lourdes SledgeLouise Price. Ms. Samuella Cotarice stated that she had spoken to Franc[facility] CastlePatricia [facility] for [facility] rehab. LCSW discussed PT recommendations as well as patient's insurance. Ms. Samuella Cotarice advised that she was willing to pay privately for patient to go to rehab at SNF if the insurance would not authorize. She also advised that they had applied for Medicaid on patient's behalf.   LCSW to [facility] clinical[facility] and send [employer] authorization request.        Harland DingwallSettle, Juleen ChinaHeather D, LCSW

## 2017-05-10 DIAGNOSIS — L03319 Cellulitis of trunk, unspecified: Secondary | ICD-10-CM

## 2017-05-10 DIAGNOSIS — E8809 Other disorders of plasma-protein metabolism, not elsewhere classified: Secondary | ICD-10-CM

## 2017-05-10 LAB — CBC WITH DIFFERENTIAL/PLATELET
Basophils Absolute: 0 10*3/uL (ref 0.0–0.1)
Basophils Relative: 0 %
Eosinophils Absolute: 0.4 10*3/uL (ref 0.0–0.7)
Eosinophils Relative: 6 %
HCT: 23.8 % — ABNORMAL LOW (ref 36.0–46.0)
HEMOGLOBIN: 7.9 g/dL — AB (ref 12.0–15.0)
LYMPHS PCT: 23 %
Lymphs Abs: 1.5 10*3/uL (ref 0.7–4.0)
MCH: 32.2 pg (ref 26.0–34.0)
MCHC: 33.2 g/dL (ref 30.0–36.0)
MCV: 97.1 fL (ref 78.0–100.0)
MONO ABS: 0.7 10*3/uL (ref 0.1–1.0)
Monocytes Relative: 11 %
NEUTROS ABS: 3.8 10*3/uL (ref 1.7–7.7)
Neutrophils Relative %: 60 %
Platelets: 184 10*3/uL (ref 150–400)
RBC: 2.45 MIL/uL — ABNORMAL LOW (ref 3.87–5.11)
RDW: 17 % — ABNORMAL HIGH (ref 11.5–15.5)
WBC: 6.4 10*3/uL (ref 4.0–10.5)

## 2017-05-10 LAB — BASIC METABOLIC PANEL
Anion gap: 12 (ref 5–15)
BUN: 19 mg/dL (ref 6–20)
CHLORIDE: 87 mmol/L — AB (ref 101–111)
CO2: 29 mmol/L (ref 22–32)
CREATININE: 3.02 mg/dL — AB (ref 0.44–1.00)
Calcium: 7.4 mg/dL — ABNORMAL LOW (ref 8.9–10.3)
GFR calc Af Amer: 16 mL/min — ABNORMAL LOW (ref >60)
GFR calc non Af Amer: 14 mL/min — ABNORMAL LOW (ref >60)
GLUCOSE: 85 mg/dL (ref 65–99)
Potassium: 3.7 mmol/L (ref 3.5–5.1)
Sodium: 128 mmol/L — ABNORMAL LOW (ref 135–145)

## 2017-05-10 LAB — GLUCOSE, CAPILLARY
GLUCOSE-CAPILLARY: 92 mg/dL (ref 65–99)
Glucose-Capillary: 100 mg/dL — ABNORMAL HIGH (ref 65–99)
Glucose-Capillary: 112 mg/dL — ABNORMAL HIGH (ref 65–99)

## 2017-05-10 LAB — OCCULT BLOOD X 1 CARD TO LAB, STOOL: FECAL OCCULT BLD: NEGATIVE

## 2017-05-10 MED ORDER — ALBUTEROL SULFATE (2.5 MG/3ML) 0.083% IN NEBU
3.0000 mL | INHALATION_SOLUTION | Freq: Two times a day (BID) | RESPIRATORY_TRACT | Status: DC
  Administered 2017-05-10 – 2017-05-13 (×6): 3 mL via RESPIRATORY_TRACT
  Filled 2017-05-10 (×6): qty 3

## 2017-05-10 MED ORDER — FUROSEMIDE 10 MG/ML IJ SOLN
60.0000 mg | Freq: Two times a day (BID) | INTRAMUSCULAR | Status: DC
  Administered 2017-05-10 – 2017-05-11 (×3): 60 mg via INTRAVENOUS
  Filled 2017-05-10 (×3): qty 6

## 2017-05-10 MED ORDER — ALBUMIN HUMAN 25 % IV SOLN
25.0000 g | Freq: Two times a day (BID) | INTRAVENOUS | Status: DC
  Administered 2017-05-10 – 2017-05-13 (×6): 25 g via INTRAVENOUS
  Filled 2017-05-10 (×2): qty 100
  Filled 2017-05-10: qty 50
  Filled 2017-05-10 (×2): qty 100
  Filled 2017-05-10: qty 50
  Filled 2017-05-10 (×2): qty 100

## 2017-05-10 NOTE — Progress Notes (Signed)
Subjective: Patient has continued nausea, no vomiting. Has not asked for prn Zofran. Denies abdominal pain at this time. No further diarrhea. No hematochezia or melena. No other upper or lower GI symptoms at this time.  Objective: Vital signs in last 24 hours: Temp:  [98 F (36.7 C)-98.6 F (37 C)] 98 F (36.7 C) (08/17 0609) Pulse Rate:  [76-87] 76 (08/17 0609) Resp:  [14-18] 16 (08/17 0609) BP: (102-115)/(51-65) 102/52 (08/17 0609) SpO2:  [94 %-97 %] 94 % (08/17 0759) Last BM Date: 05/09/17 General:   Alert and oriented, pleasant. Appears tired. Head:  Normocephalic and atraumatic. Eyes:  No icterus, sclera clear. Conjuctiva pink.  Heart:  S1, S2 present, no murmurs noted.  Lungs: Clear to auscultation bilaterally, without wheezing, rales, or rhonchi.  Abdomen:  Bowel sounds present, rounded but soft, non-tender, non-distended. No HSM or hernias noted. No rebound or guarding. No masses appreciated  Msk:  Symmetrical without gross deformities. Pulses:  Normal bilateral DP pulses noted. Extremities:  Without clubbing. Bilateral LE 1-2+ pitting edema without warmth or redness. Neurologic:  Alert and  oriented x4;  grossly normal neurologically. Skin:  Abdominal wall skin somewhat pink and warm, not obvious/significant worsening.  Psych:  Alert and cooperative. Normal mood and affect.  Intake/Output from previous day: 08/16 0701 - 08/17 0700 In: 580 [P.O.:480; IV Piggyback:100] Out: 300 [Urine:300] Intake/Output this shift: Total I/O In: 120 [P.O.:120] Out: -   Lab Results:  Recent Labs  05/08/17 0604 05/09/17 0812 05/10/17 0452  WBC 6.7 6.5 6.4  HGB 7.7* 8.1* 7.9*  HCT 23.2* 24.0* 23.8*  PLT 163 173 184   BMET  Recent Labs  05/08/17 0604 05/09/17 0812 05/10/17 0452  NA 131* 130* 128*  K 3.8 3.7 3.7  CL 91* 89* 87*  CO2 31 31 29   GLUCOSE 74 83 85  BUN 17 18 19   CREATININE 2.55* 2.81* 3.02*  CALCIUM 7.2* 7.2* 7.4*   LFT  Recent Labs   05/09/17 0812  PROT 4.5*  ALBUMIN 2.0*  AST 46*  ALT 25  ALKPHOS 128*  BILITOT 2.0*  BILIDIR 1.0*  IBILI 1.0*   PT/INR No results for input(s): LABPROT, INR in the last 72 hours. Hepatitis Panel No results for input(s): HEPBSAG, HCVAB, HEPAIGM, HEPBIGM in the last 72 hours.   Studies/Results: No results found.  Assessment: 76 year old female with anasarca, liver disease, postprandial N/V, diarrhea, anemia, and abdominal wall cellulitis.  #1. Anasarca complicated by lower abdominal wall cellulitis. No further abdominal pain, abdominal wall pink in the lower portions and some warmth but she feels is improving. Receiving IV albumin, no CMP/HFP drawn today.  #2. Postprandial nausea and vomiting. Has been receiving PPI for GERD and IV Reglan. Tolerating liquids fine, still nauseated with solid foods and no desire to eat solid foods; no vomiting. Will likely switch Reglan to po in the next 24 hours if no ADEs (none noted today).  #3. Diarrhea. Diarrhea has resolved on Immodium. CDiff and GI Path panel negative. Continue Imodium for now.  #4. Anemia. She has history of iron deficiency anemia but iron studies 2 months ago were normal. Felt to be primarily due to chronic disease. Hemoccult resulted and negative.  #5. Liver disease. Impressive fatty change without sign of cirrhosis on CT. Low serum albumin (2.0 yesterday) likely multifactorial. Receiving IV albumin currently.   Plan: 1. HFP tomorrow to check LFTs and albumin 2. Continue IV Reglan and convert to po in the next 24 hours if  no side effects 3. Encouraged to ask for prn antiemetic as needed 4. Continue IV albumin as previously ordered 5. Continue antibiotics for cellulitis as previously ordered 6. Continue Imodium 7. Supportive measures   Thank you for allowing [place] to participate in the care of Domingo Sep, DNP, AGNP-C Adult & Gerontological Nurse Practitioner [facility]     LOS: 11 days    05/10/2017, 12:30 PM

## 2017-05-10 NOTE — Progress Notes (Signed)
Subjective: She looks a little better this morning. She is still having nausea. She has been started on low-dose Reglan and I think that has helped. Renal function continues to deteriorate so I have asked for her to be seen by nephrology. She is on albumin now.  Objective: Vital signs in last 24 hours: Temp:  [98 F (36.7 C)-98.6 F (37 C)] 98 F (36.7 C) (08/17 0609) Pulse Rate:  [76-87] 76 (08/17 0609) Resp:  [14-18] 16 (08/17 0609) BP: (102-115)/(51-65) 102/52 (08/17 0609) SpO2:  [94 %-97 %] 94 % (08/17 0759) Weight change:  Last BM Date: 05/09/17  Intake/Output from previous day: 08/16 0701 - 08/17 0700 In: 580 [P.O.:480; IV Piggyback:100] Out: 300 [Urine:300]  PHYSICAL EXAM General appearance: alert, cooperative and mild distress Resp: rhonchi bilaterally Cardio: regular rate and rhythm, S1, S2 normal, no murmur, click, rub or gallop GI: Still some edema and erythema of the abdominal wall Extremities: venous stasis dermatitis noted and Chronic third spacing  Less anxious  Lab Results:  Results for orders placed or performed during the hospital encounter of 04/29/17 (from the past 48 hour(s))  Gastrointestinal Panel by PCR , Stool     Status: None   Collection Time: 05/08/17 10:51 AM  Result Value Ref Range   Campylobacter species NOT DETECTED NOT DETECTED   Plesimonas shigelloides NOT DETECTED NOT DETECTED   Salmonella species NOT DETECTED NOT DETECTED   Yersinia enterocolitica NOT DETECTED NOT DETECTED   Vibrio species NOT DETECTED NOT DETECTED   Vibrio cholerae NOT DETECTED NOT DETECTED   Enteroaggregative E coli (EAEC) NOT DETECTED NOT DETECTED   Enteropathogenic E coli (EPEC) NOT DETECTED NOT DETECTED   Enterotoxigenic E coli (ETEC) NOT DETECTED NOT DETECTED   Shiga like toxin producing E coli (STEC) NOT DETECTED NOT DETECTED   Shigella/Enteroinvasive E coli (EIEC) NOT DETECTED NOT DETECTED   Cryptosporidium NOT DETECTED NOT DETECTED   Cyclospora cayetanensis  NOT DETECTED NOT DETECTED   Entamoeba histolytica NOT DETECTED NOT DETECTED   Giardia lamblia NOT DETECTED NOT DETECTED   Adenovirus F40/41 NOT DETECTED NOT DETECTED   Astrovirus NOT DETECTED NOT DETECTED   Norovirus GI/GII NOT DETECTED NOT DETECTED   Rotavirus A NOT DETECTED NOT DETECTED   Sapovirus (I, II, IV, and V) NOT DETECTED NOT DETECTED  Glucose, capillary     Status: Abnormal   Collection Time: 05/08/17 12:03 PM  Result Value Ref Range   Glucose-Capillary 111 (H) 65 - 99 mg/dL   Comment 1 Notify RN    Comment 2 Document in Chart   Glucose, capillary     Status: None   Collection Time: 05/08/17  5:05 PM  Result Value Ref Range   Glucose-Capillary 89 65 - 99 mg/dL   Comment 1 Notify RN    Comment 2 Document in Chart   Glucose, capillary     Status: None   Collection Time: 05/08/17  9:20 PM  Result Value Ref Range   Glucose-Capillary 99 65 - 99 mg/dL  Glucose, capillary     Status: None   Collection Time: 05/09/17  7:20 AM  Result Value Ref Range   Glucose-Capillary 81 65 - 99 mg/dL   Comment 1 Notify RN    Comment 2 Document in Chart   CBC with Differential/Platelet     Status: Abnormal   Collection Time: 05/09/17  8:12 AM  Result Value Ref Range   WBC 6.5 4.0 - 10.5 K/uL   RBC 2.47 (L) 3.87 - 5.11 MIL/uL  Hemoglobin 8.1 (L) 12.0 - 15.0 g/dL   HCT 24.0 (L) 36.0 - 46.0 %   MCV 97.2 78.0 - 100.0 fL   MCH 32.8 26.0 - 34.0 pg   MCHC 33.8 30.0 - 36.0 g/dL   RDW 16.9 (H) 11.5 - 15.5 %   Platelets 173 150 - 400 K/uL   Neutrophils Relative % 58 %   Neutro Abs 3.8 1.7 - 7.7 K/uL   Lymphocytes Relative 25 %   Lymphs Abs 1.6 0.7 - 4.0 K/uL   Monocytes Relative 9 %   Monocytes Absolute 0.6 0.1 - 1.0 K/uL   Eosinophils Relative 8 %   Eosinophils Absolute 0.5 0.0 - 0.7 K/uL   Basophils Relative 0 %   Basophils Absolute 0.0 0.0 - 0.1 K/uL  Basic metabolic panel     Status: Abnormal   Collection Time: 05/09/17  8:12 AM  Result Value Ref Range   Sodium 130 (L) 135 - 145  mmol/L   Potassium 3.7 3.5 - 5.1 mmol/L   Chloride 89 (L) 101 - 111 mmol/L   CO2 31 22 - 32 mmol/L   Glucose, Bld 83 65 - 99 mg/dL   BUN 18 6 - 20 mg/dL   Creatinine, Ser 2.81 (H) 0.44 - 1.00 mg/dL   Calcium 7.2 (L) 8.9 - 10.3 mg/dL   GFR calc non Af Amer 16 (L) >60 mL/min   GFR calc Af Amer 18 (L) >60 mL/min    Comment: (NOTE) The eGFR has been calculated using the CKD EPI equation. This calculation has not been validated in all clinical situations. eGFR's persistently <60 mL/min signify possible Chronic Kidney Disease.    Anion gap 10 5 - 15  Hepatic function panel     Status: Abnormal   Collection Time: 05/09/17  8:12 AM  Result Value Ref Range   Total Protein 4.5 (L) 6.5 - 8.1 g/dL   Albumin 2.0 (L) 3.5 - 5.0 g/dL   AST 46 (H) 15 - 41 U/L   ALT 25 14 - 54 U/L   Alkaline Phosphatase 128 (H) 38 - 126 U/L   Total Bilirubin 2.0 (H) 0.3 - 1.2 mg/dL   Bilirubin, Direct 1.0 (H) 0.1 - 0.5 mg/dL   Indirect Bilirubin 1.0 (H) 0.3 - 0.9 mg/dL  Glucose, capillary     Status: None   Collection Time: 05/09/17 11:48 AM  Result Value Ref Range   Glucose-Capillary 93 65 - 99 mg/dL   Comment 1 Notify RN    Comment 2 Document in Chart   Glucose, capillary     Status: Abnormal   Collection Time: 05/09/17  4:51 PM  Result Value Ref Range   Glucose-Capillary 112 (H) 65 - 99 mg/dL   Comment 1 Notify RN    Comment 2 Document in Chart   Glucose, capillary     Status: Abnormal   Collection Time: 05/09/17  9:55 PM  Result Value Ref Range   Glucose-Capillary 104 (H) 65 - 99 mg/dL  CBC with Differential/Platelet     Status: Abnormal   Collection Time: 05/10/17  4:52 AM  Result Value Ref Range   WBC 6.4 4.0 - 10.5 K/uL   RBC 2.45 (L) 3.87 - 5.11 MIL/uL   Hemoglobin 7.9 (L) 12.0 - 15.0 g/dL   HCT 23.8 (L) 36.0 - 46.0 %   MCV 97.1 78.0 - 100.0 fL   MCH 32.2 26.0 - 34.0 pg   MCHC 33.2 30.0 - 36.0 g/dL   RDW 17.0 (H) 11.5 -  15.5 %   Platelets 184 150 - 400 K/uL   Neutrophils Relative % 60 %    Neutro Abs 3.8 1.7 - 7.7 K/uL   Lymphocytes Relative 23 %   Lymphs Abs 1.5 0.7 - 4.0 K/uL   Monocytes Relative 11 %   Monocytes Absolute 0.7 0.1 - 1.0 K/uL   Eosinophils Relative 6 %   Eosinophils Absolute 0.4 0.0 - 0.7 K/uL   Basophils Relative 0 %   Basophils Absolute 0.0 0.0 - 0.1 K/uL  Basic metabolic panel     Status: Abnormal   Collection Time: 05/10/17  4:52 AM  Result Value Ref Range   Sodium 128 (L) 135 - 145 mmol/L   Potassium 3.7 3.5 - 5.1 mmol/L   Chloride 87 (L) 101 - 111 mmol/L   CO2 29 22 - 32 mmol/L   Glucose, Bld 85 65 - 99 mg/dL   BUN 19 6 - 20 mg/dL   Creatinine, Ser 3.02 (H) 0.44 - 1.00 mg/dL   Calcium 7.4 (L) 8.9 - 10.3 mg/dL   GFR calc non Af Amer 14 (L) >60 mL/min   GFR calc Af Amer 16 (L) >60 mL/min    Comment: (NOTE) The eGFR has been calculated using the CKD EPI equation. This calculation has not been validated in all clinical situations. eGFR's persistently <60 mL/min signify possible Chronic Kidney Disease.    Anion gap 12 5 - 15  Glucose, capillary     Status: None   Collection Time: 05/10/17  7:44 AM  Result Value Ref Range   Glucose-Capillary 92 65 - 99 mg/dL   Comment 1 Notify RN    Comment 2 Document in Chart     ABGS No results for input(s): PHART, PO2ART, TCO2, HCO3 in the last 72 hours.  Invalid input(s): PCO2 CULTURES Recent Results (from the past 240 hour(s))  C difficile quick scan w PCR reflex     Status: None   Collection Time: 04/30/17  4:55 PM  Result Value Ref Range Status   C Diff antigen NEGATIVE NEGATIVE Final   C Diff toxin NEGATIVE NEGATIVE Final   C Diff interpretation No C. difficile detected.  Final  C difficile quick scan w PCR reflex     Status: None   Collection Time: 05/07/17 10:00 AM  Result Value Ref Range Status   C Diff antigen NEGATIVE NEGATIVE Final   C Diff toxin NEGATIVE NEGATIVE Final   C Diff interpretation No C. difficile detected.  Final  Gastrointestinal Panel by PCR , Stool     Status: None    Collection Time: 05/08/17 10:51 AM  Result Value Ref Range Status   Campylobacter species NOT DETECTED NOT DETECTED Final   Plesimonas shigelloides NOT DETECTED NOT DETECTED Final   Salmonella species NOT DETECTED NOT DETECTED Final   Yersinia enterocolitica NOT DETECTED NOT DETECTED Final   Vibrio species NOT DETECTED NOT DETECTED Final   Vibrio cholerae NOT DETECTED NOT DETECTED Final   Enteroaggregative E coli (EAEC) NOT DETECTED NOT DETECTED Final   Enteropathogenic E coli (EPEC) NOT DETECTED NOT DETECTED Final   Enterotoxigenic E coli (ETEC) NOT DETECTED NOT DETECTED Final   Shiga like toxin producing E coli (STEC) NOT DETECTED NOT DETECTED Final   Shigella/Enteroinvasive E coli (EIEC) NOT DETECTED NOT DETECTED Final   Cryptosporidium NOT DETECTED NOT DETECTED Final   Cyclospora cayetanensis NOT DETECTED NOT DETECTED Final   Entamoeba histolytica NOT DETECTED NOT DETECTED Final   Giardia lamblia NOT DETECTED NOT DETECTED  Final   Adenovirus F40/41 NOT DETECTED NOT DETECTED Final   Astrovirus NOT DETECTED NOT DETECTED Final   Norovirus GI/GII NOT DETECTED NOT DETECTED Final   Rotavirus A NOT DETECTED NOT DETECTED Final   Sapovirus (I, II, IV, and V) NOT DETECTED NOT DETECTED Final   Studies/Results: No results found.  Medications:  Prior to Admission:  Prescriptions Prior to Admission  Medication Sig Dispense Refill Last Dose  . acetaminophen (TYLENOL) 325 MG tablet Take 2 tablets (650 mg total) by mouth every 6 (six) hours as needed for mild pain (or Fever >/= 101).   Past Week at Unknown time  . albuterol (PROVENTIL HFA;VENTOLIN HFA) 108 (90 Base) MCG/ACT inhaler Inhale 2 puffs into the lungs 4 (four) times daily.    04/29/2017 at Unknown time  . apixaban (ELIQUIS) 5 MG TABS tablet Take 1 tablet (5 mg total) by mouth 2 (two) times daily. 60 tablet 5 04/29/2017 at 1030-1100a  . calcium carbonate (TUMS - DOSED IN MG ELEMENTAL CALCIUM) 500 MG chewable tablet Chew 1 tablet by mouth 2  (two) times daily.   04/29/2017 at Unknown time  . carboxymethylcellulose (REFRESH PLUS) 0.5 % SOLN Apply 1 drop to eye daily as needed (for dry eyes).   Past Week at Unknown time  . cetirizine (ZYRTEC) 10 MG tablet Take 10 mg by mouth daily.   04/29/2017 at Unknown time  . cholecalciferol (VITAMIN D) 1000 units tablet Take 1,000 Units by mouth every morning.   04/29/2017 at Unknown time  . colchicine 0.6 MG tablet Take 1 tablet (0.6 mg total) by mouth daily. 30 tablet 0 04/29/2017 at Unknown time  . diltiazem (CARDIZEM CD) 120 MG 24 hr capsule Take 1 capsule (120 mg total) by mouth daily. 90 capsule 3 04/29/2017 at Unknown time  . escitalopram (LEXAPRO) 20 MG tablet Take 1 tablet (20 mg total) by mouth daily. 30 tablet 12 04/29/2017 at Unknown time  . fluticasone furoate-vilanterol (BREO ELLIPTA) 200-25 MCG/INH AEPB Inhale 1 puff into the lungs daily.   04/29/2017 at Unknown time  . furosemide (LASIX) 20 MG tablet Take 1 tablet (20 mg total) by mouth daily. 30 tablet 0 04/29/2017 at Unknown time  . gabapentin (NEURONTIN) 100 MG capsule Take 2 capsules (200 mg total) by mouth at bedtime. 30 capsule 0 04/28/2017 at Unknown time  . levothyroxine (SYNTHROID, LEVOTHROID) 137 MCG tablet Take 137 mcg by mouth daily before breakfast.   04/29/2017 at Unknown time  . loperamide (IMODIUM) 2 MG capsule Take 1 capsule (2 mg total) by mouth every morning. (Patient taking differently: Take 2 mg by mouth every morning. *May take up to 4 times daily as needed for loose stools and diarrhea) 90 capsule 5 Past Week at Unknown time  . metoprolol tartrate (LOPRESSOR) 25 MG tablet TAKE 1/2 TABLET BY MOUTH TWICE DAILY 90 tablet 1 04/29/2017 at 1030-1100a  . omeprazole (PRILOSEC) 20 MG capsule TAKE ONE CAPSULE BY MOUTH DAILY 90 capsule 3 04/29/2017 at Unknown time  . Pediatric Multivitamins-Iron (FLINTSTONES PLUS IRON) chewable tablet Chew 1 tablet by mouth 2 (two) times daily.   04/29/2017 at Unknown time  . potassium chloride SA (K-DUR,KLOR-CON) 20  MEQ tablet Take 1 tablet (20 mEq total) by mouth daily. 30 tablet 0 04/29/2017 at Unknown time  . sodium bicarbonate 650 MG tablet Take 1 tablet (650 mg total) by mouth 2 (two) times daily. 60 tablet 0 04/29/2017 at Unknown time  . triamcinolone (NASACORT ALLERGY 24HR) 55 MCG/ACT AERO nasal inhaler Place 2  sprays into the nose daily.   unknown  . vancomycin (VANCOCIN) 50 mg/mL oral solution Take 2.5 mLs (125 mg total) by mouth 2 (two) times daily. 70 mL 0 04/28/2017 at Unknown time  . allopurinol (ZYLOPRIM) 100 MG tablet Take 150 mg by mouth daily.  0   . Amino Acids-Protein Hydrolys (FEEDING SUPPLEMENT, PRO-STAT SUGAR FREE 64,) LIQD Take 30 mLs by mouth 2 (two) times daily. (Patient not taking: Reported on 04/29/2017) 900 mL 0 Not Taking at Unknown time  . Doxepin HCl (SILENOR) 6 MG TABS Take 1 tablet (6 mg total) by mouth at bedtime as needed (for sleep). (Patient not taking: Reported on 04/29/2017) 10 tablet 0 Not Taking at Unknown time  . insulin glargine (LANTUS) 100 UNIT/ML injection Inject 0.2 mLs (20 Units total) into the skin daily. (Patient not taking: Reported on 04/29/2017) 10 mL 11 Not Taking at Unknown time  . ondansetron (ZOFRAN-ODT) 4 MG disintegrating tablet Take 1 tablet (4 mg total) by mouth 3 (three) times daily before meals. (Patient not taking: Reported on 04/29/2017) 20 tablet 0 Not Taking at Unknown time   Scheduled: . albuterol  3 mL Inhalation BID  . allopurinol  150 mg Oral Daily  . apixaban  5 mg Oral BID  . calcium carbonate  1 tablet Oral BID  . cholecalciferol  1,000 Units Oral q morning - 10a  . colchicine  0.6 mg Oral Q48H  . diltiazem  120 mg Oral Daily  . doxycycline  100 mg Oral Q12H  . escitalopram  20 mg Oral Daily  . feeding supplement  1 Container Oral TID BM  . feeding supplement (PRO-STAT SUGAR FREE 64)  30 mL Oral BID  . fluticasone  2 spray Each Nare Daily  . fluticasone furoate-vilanterol  1 puff Inhalation Daily  . gabapentin  200 mg Oral QHS  . insulin aspart   0-9 Units Subcutaneous TID WC  . levothyroxine  137 mcg Oral QAC breakfast  . loperamide  2 mg Oral BID AC  . metoCLOPramide (REGLAN) injection  5 mg Intravenous Q6H  . metoprolol tartrate  12.5 mg Oral BID  . multivitamin with minerals  1 tablet Oral BID  . ondansetron  8 mg Oral TID AC & HS  . pantoprazole  40 mg Oral QAC breakfast  . sodium bicarbonate  650 mg Oral BID  . sodium chloride flush  3 mL Intravenous Q12H   Continuous: . sodium chloride    . sodium chloride 50 mL/hr at 05/07/17 2219  . albumin human     RJP:[id number] chloride, acetaminophen **OR** acetaminophen, acetaminophen, ondansetron (ZOFRAN) IV, polyvinyl alcohol, sodium chloride flush  Assesment: She was admitted with cellulitis and initially improved substantially to the point that she was approaching discharge. However she then developed increasing swelling and was placed on Lasix. She developed recurrent nausea which has been a problem for the last several months. She then developed acute on chronic renal failure which has been a substantial issue for her when she develops swelling then gets placed on diuretics then she seems to develop the renal failure. She has malnutrition and albumin is being replaced to see if it will help [place] with her third spacing of fluids. Nephrology consultation has been requested. She is already being seen by GI for her chronic nausea the prolonged episode of C. difficile colitis. She has generalized failure to thrive in adult. She has been in and out of the hospital and nursing home for the last several months and  I'm concerned that she may not be able to go directly home again. Principal Problem:   Cellulitis Active Problems:   HTN (hypertension)   COPD (chronic obstructive pulmonary disease) (HCC)   PAF (paroxysmal atrial fibrillation) (HCC)   Chronic anticoagulation   Chronic edema   Obesity   Diabetes mellitus type 2 in obese (HCC)   Chronic diastolic CHF (congestive heart failure)  ([place])    Plan: Nephrology consultation. Continue albumin for now. Difficult situation in a patient who had previously been independent and able to manage on her own although with multiple severe medical problems.    LOS: 11 days   Wyndell Cardiff L 05/10/2017, 8:45 AM

## 2017-05-10 NOTE — Consult Note (Addendum)
Reason for Consult: Acute kidney injury superimposed on chronic Referring Physician: Dr. Debbe Odea is an 76 y.o. female.  HPI: She is a patient one has history of fatty liver disease, chronic renal failure stage III, hypertension, diastolic dysfunction with CHF presently came with complaints of right flank pain and some redness of her skin. It was thought to be secondary to cellulitis and patient admitted to the hospital. Patient also complains of nausea, vomiting and diarrhea which seems to be getting better. Presently consult is called because of worsening of her renal failure. At this moment the diarrhea is gone she has some nausea but no vomiting. Patient denies any fever or chills or sweating. She states that she has occasional difficulty breathing mainly from her COPD.  Past Medical History:  Diagnosis Date  . Barrett esophagus   . Bronchitis   . C. difficile colitis   . Cellulitis   . Cellulitis   . CHF (congestive heart failure) (HCC)   . COPD (chronic obstructive pulmonary disease) (HCC)   . Diabetes mellitus without complication (HCC)   . Diastolic heart failure (HCC)   . Difficulty walking   . Dysphagia   . Essential hypertension   . Gastritis   . GERD (gastroesophageal reflux disease)   . Gout   . Hepatomegaly    ???  . History of falling   . Hyponatremia   . Hypothyroidism   . Iron deficiency anemia   . Muscle weakness   . PAF (paroxysmal atrial fibrillation) (HCC)    Eliquis  . Sleep apnea   . Stage III chronic kidney disease     Past Surgical History:  Procedure Laterality Date  . BREAST BIOPSY Right Sept, 2012  . CHOLECYSTECTOMY    . COLONOSCOPY    . ESOPHAGEAL DILATION N/A 02/05/2017   Procedure: ESOPHAGEAL DILATION;  Surgeon: Malissa Hippo, MD;  Location: [facility];  Service: Endoscopy;  Laterality: N/A;  . ESOPHAGOGASTRODUODENOSCOPY N/A 02/05/2017   Procedure: ESOPHAGOGASTRODUODENOSCOPY (EGD);  Surgeon: Malissa Hippo, MD;  Location:  [facility];  Service: Endoscopy;  Laterality: N/A;  . UPPER GASTROINTESTINAL ENDOSCOPY      Family History  Problem Relation Age of Onset  . Dementia Mother   . Lung cancer Father        smoked  . Hypertension Sister   . Diabetes Brother   . Obesity Sister   . Hypertension Sister   . Restless legs syndrome Sister   . Healthy Sister   . Lung cancer Maternal Uncle     Social History:  reports that she quit smoking about 17 years ago. Her smoking use included Cigarettes. She started smoking about 58 years ago. She has a 15.00 pack-year smoking history. She has never used smokeless tobacco. She reports that she does not drink alcohol or use drugs.  Allergies:  Allergies  Allergen Reactions  . Ciprofloxacin Shortness Of Breath    REACTION: sob,tachycardia  . Fish Oil     Patient face drew to the side,Bells Palsey  . Guaifenesin Shortness Of Breath    REACTION: sob,tachycardia  . Mucinex [Guaifenesin Er] Shortness Of Breath  . Sulfamethoxazole W/Trimethoprim 800-160 [Sulfamethoxazole-Trimethoprim] Nausea Only    Also lack of appetite   . Uloric [Febuxostat] Swelling    No urination  . Adhesive [Tape] Other (See Comments)    Causes blisters on skin  . Cefuroxime Axetil Swelling    Swelling all over body-per patient she was hospitalized as a result of  taking this medication  . Metolazone Nausea Only    Swelling   . Tramadol     insomnia    Medications: I have reviewed the patient's current medications.  Results for orders placed or performed during the hospital encounter of 04/29/17 (from the past 48 hour(s))  Gastrointestinal Panel by PCR , Stool     Status: None   Collection Time: 05/08/17 10:51 AM  Result Value Ref Range   Campylobacter species NOT DETECTED NOT DETECTED   Plesimonas shigelloides NOT DETECTED NOT DETECTED   Salmonella species NOT DETECTED NOT DETECTED   Yersinia enterocolitica NOT DETECTED NOT DETECTED   Vibrio species NOT DETECTED NOT DETECTED    Vibrio cholerae NOT DETECTED NOT DETECTED   Enteroaggregative E coli (EAEC) NOT DETECTED NOT DETECTED   Enteropathogenic E coli (EPEC) NOT DETECTED NOT DETECTED   Enterotoxigenic E coli (ETEC) NOT DETECTED NOT DETECTED   Shiga like toxin producing E coli (STEC) NOT DETECTED NOT DETECTED   Shigella/Enteroinvasive E coli (EIEC) NOT DETECTED NOT DETECTED   Cryptosporidium NOT DETECTED NOT DETECTED   Cyclospora cayetanensis NOT DETECTED NOT DETECTED   Entamoeba histolytica NOT DETECTED NOT DETECTED   Giardia lamblia NOT DETECTED NOT DETECTED   Adenovirus F40/41 NOT DETECTED NOT DETECTED   Astrovirus NOT DETECTED NOT DETECTED   Norovirus GI/GII NOT DETECTED NOT DETECTED   Rotavirus A NOT DETECTED NOT DETECTED   Sapovirus (I, II, IV, and V) NOT DETECTED NOT DETECTED  Glucose, capillary     Status: Abnormal   Collection Time: 05/08/17 12:03 PM  Result Value Ref Range   Glucose-Capillary 111 (H) 65 - 99 mg/dL   Comment 1 Notify RN    Comment 2 Document in Chart   Glucose, capillary     Status: None   Collection Time: 05/08/17  5:05 PM  Result Value Ref Range   Glucose-Capillary 89 65 - 99 mg/dL   Comment 1 Notify RN    Comment 2 Document in Chart   Glucose, capillary     Status: None   Collection Time: 05/08/17  9:20 PM  Result Value Ref Range   Glucose-Capillary 99 65 - 99 mg/dL  Glucose, capillary     Status: None   Collection Time: 05/09/17  7:20 AM  Result Value Ref Range   Glucose-Capillary 81 65 - 99 mg/dL   Comment 1 Notify RN    Comment 2 Document in Chart   CBC with Differential/Platelet     Status: Abnormal   Collection Time: 05/09/17  8:12 AM  Result Value Ref Range   WBC 6.5 4.0 - 10.5 K/uL   RBC 2.47 (L) 3.87 - 5.11 MIL/uL   Hemoglobin 8.1 (L) 12.0 - 15.0 g/dL   HCT 24.0 (L) 36.0 - 46.0 %   MCV 97.2 78.0 - 100.0 fL   MCH 32.8 26.0 - 34.0 pg   MCHC 33.8 30.0 - 36.0 g/dL   RDW 16.9 (H) 11.5 - 15.5 %   Platelets 173 150 - 400 K/uL   Neutrophils Relative % 58 %    Neutro Abs 3.8 1.7 - 7.7 K/uL   Lymphocytes Relative 25 %   Lymphs Abs 1.6 0.7 - 4.0 K/uL   Monocytes Relative 9 %   Monocytes Absolute 0.6 0.1 - 1.0 K/uL   Eosinophils Relative 8 %   Eosinophils Absolute 0.5 0.0 - 0.7 K/uL   Basophils Relative 0 %   Basophils Absolute 0.0 0.0 - 0.1 K/uL  Basic metabolic panel  Status: Abnormal   Collection Time: 05/09/17  8:12 AM  Result Value Ref Range   Sodium 130 (L) 135 - 145 mmol/L   Potassium 3.7 3.5 - 5.1 mmol/L   Chloride 89 (L) 101 - 111 mmol/L   CO2 31 22 - 32 mmol/L   Glucose, Bld 83 65 - 99 mg/dL   BUN 18 6 - 20 mg/dL   Creatinine, Ser 2.81 (H) 0.44 - 1.00 mg/dL   Calcium 7.2 (L) 8.9 - 10.3 mg/dL   GFR calc non Af Amer 16 (L) >60 mL/min   GFR calc Af Amer 18 (L) >60 mL/min    Comment: (NOTE) The eGFR has been calculated using the CKD EPI equation. This calculation has not been validated in all clinical situations. eGFR's persistently <60 mL/min signify possible Chronic Kidney Disease.    Anion gap 10 5 - 15  Hepatic function panel     Status: Abnormal   Collection Time: 05/09/17  8:12 AM  Result Value Ref Range   Total Protein 4.5 (L) 6.5 - 8.1 g/dL   Albumin 2.0 (L) 3.5 - 5.0 g/dL   AST 46 (H) 15 - 41 U/L   ALT 25 14 - 54 U/L   Alkaline Phosphatase 128 (H) 38 - 126 U/L   Total Bilirubin 2.0 (H) 0.3 - 1.2 mg/dL   Bilirubin, Direct 1.0 (H) 0.1 - 0.5 mg/dL   Indirect Bilirubin 1.0 (H) 0.3 - 0.9 mg/dL  Glucose, capillary     Status: None   Collection Time: 05/09/17 11:48 AM  Result Value Ref Range   Glucose-Capillary 93 65 - 99 mg/dL   Comment 1 Notify RN    Comment 2 Document in Chart   Glucose, capillary     Status: Abnormal   Collection Time: 05/09/17  4:51 PM  Result Value Ref Range   Glucose-Capillary 112 (H) 65 - 99 mg/dL   Comment 1 Notify RN    Comment 2 Document in Chart   Glucose, capillary     Status: Abnormal   Collection Time: 05/09/17  9:55 PM  Result Value Ref Range   Glucose-Capillary 104 (H) 65 - 99  mg/dL  CBC with Differential/Platelet     Status: Abnormal   Collection Time: 05/10/17  4:52 AM  Result Value Ref Range   WBC 6.4 4.0 - 10.5 K/uL   RBC 2.45 (L) 3.87 - 5.11 MIL/uL   Hemoglobin 7.9 (L) 12.0 - 15.0 g/dL   HCT 23.8 (L) 36.0 - 46.0 %   MCV 97.1 78.0 - 100.0 fL   MCH 32.2 26.0 - 34.0 pg   MCHC 33.2 30.0 - 36.0 g/dL   RDW 17.0 (H) 11.5 - 15.5 %   Platelets 184 150 - 400 K/uL   Neutrophils Relative % 60 %   Neutro Abs 3.8 1.7 - 7.7 K/uL   Lymphocytes Relative 23 %   Lymphs Abs 1.5 0.7 - 4.0 K/uL   Monocytes Relative 11 %   Monocytes Absolute 0.7 0.1 - 1.0 K/uL   Eosinophils Relative 6 %   Eosinophils Absolute 0.4 0.0 - 0.7 K/uL   Basophils Relative 0 %   Basophils Absolute 0.0 0.0 - 0.1 K/uL  Basic metabolic panel     Status: Abnormal   Collection Time: 05/10/17  4:52 AM  Result Value Ref Range   Sodium 128 (L) 135 - 145 mmol/L   Potassium 3.7 3.5 - 5.1 mmol/L   Chloride 87 (L) 101 - 111 mmol/L   CO2 29 22 -  32 mmol/L   Glucose, Bld 85 65 - 99 mg/dL   BUN 19 6 - 20 mg/dL   Creatinine, Ser 3.02 (H) 0.44 - 1.00 mg/dL   Calcium 7.4 (L) 8.9 - 10.3 mg/dL   GFR calc non Af Amer 14 (L) >60 mL/min   GFR calc Af Amer 16 (L) >60 mL/min    Comment: (NOTE) The eGFR has been calculated using the CKD EPI equation. This calculation has not been validated in all clinical situations. eGFR's persistently <60 mL/min signify possible Chronic Kidney Disease.    Anion gap 12 5 - 15  Glucose, capillary     Status: None   Collection Time: 05/10/17  7:44 AM  Result Value Ref Range   Glucose-Capillary 92 65 - 99 mg/dL   Comment 1 Notify RN    Comment 2 Document in Chart     No results found.  Review of Systems  Constitutional: Negative for chills and fever.  Respiratory: Positive for shortness of breath. Negative for cough.   Cardiovascular: Positive for leg swelling. Negative for chest pain and orthopnea.  Gastrointestinal: Positive for diarrhea, nausea and vomiting.    Blood pressure (!) 102/52, pulse 76, temperature 98 F (36.7 C), temperature source Oral, resp. rate 16, height '5\' 1"'$  (1.549 m), weight 89.4 kg (197 lb 1.5 oz), SpO2 94 %. Physical Exam  Constitutional: She is oriented to person, place, and time. No distress.  Eyes: No scleral icterus.  Neck: No JVD present.  Cardiovascular: Normal rate and regular rhythm.   Respiratory: No respiratory distress. She has no wheezes.  GI: She exhibits distension. There is no tenderness. There is no rebound.  Musculoskeletal: She exhibits edema.  Neurological: She is alert and oriented to person, place, and time.    Assessment/Plan: Problem #1 acute kidney injury superimposed on chronic. Recently her BUN and creatinine is progressively worsening. Etiology could be secondary to ATN versus prerenal. Patient had CAT scan of abdomen with contrast on 04/30/2015. Her creatinine was 0.86 at that time. On 05/01/2017 creatinine increased to 0.93. On 05/02/2017 creatinine went up to 1.10. Hence possibly contrast-induced acute kidney injury might have played some role. Problem #2 chronic renal failure: Stage III. According to the patient she was seen by nephrologist in [place] and she was told that she has a problem with her kidneys for some time. Etiology could be secondary to diabetes/hypertension/ischemic/age-related renal function/recurrent acute kidney injury. She has ultrasound done on 03/19/2069 which showed right kidney to be 10.5 and left kidney 10 cm. Problem #3 hypertension: Her blood pressure is reasonably controlled Problem #4 history of COPD Problem #5 history of nausea and vomiting. 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 She is being followed by GI Problem #6 history of fatty liver disease Problem #7 history of diastolic dysfunction: Presently nonoliguric Problem #8 hyponatremia: Hypokalemia make hyponatremia. Her sodium is declining possibly from increased freewater intake Problem #9  anasarca Problem #10 hypothyroidism  Plan: 1] We'll restrict her free water intake to 500 mL per day 2] we'll start her on Lasix 60 mg IV twice a day 3] we'll change albumin to 25 mg IV twice a day  4] we'll check her renal panel and CBC in the morning Carolin Quang S 05/10/2017, 9:11 AM

## 2017-05-10 NOTE — Progress Notes (Signed)
Physical Therapy Treatment Patient Details Name: Yolanda [name] MRN: [id number] DOB: [birth year] Today's Date: 05/10/2017    History of Present Illness Yolanda [name] is a 76 y.o. female with medical history significant of fatty liver, obesity, recurrent cellulitis, recent cdiff on oral vanc, diastolic chf, htn comes in with complaints of 3 days of right flank pain that is worsening and hot to touch. Denies any fevers or chills at home.  Area is red.  No trauma to area.  Area has been more swollen than usual.  No n/v/d.  She is on freq abx and has many allergies.  Pt found to have cellulitis to her flank and referred for admission for treatment with iv abx.      PT Comments    Patient demonstrates increased BLE strength for sit to stands/taking steps during transfer to chair, fatigues easily mostly due to SOB, tolerated sitting up for approximately 25 minutes before requesting to go back to bed.  Follow Up Recommendations  SNF     Equipment Recommendations  None recommended by PT    Recommendations for Other Services       Precautions / Restrictions Precautions Precautions: Fall    Mobility  Bed Mobility Overal bed mobility: Needs Assistance Bed Mobility: Supine to Sit Rolling: Mod assist   Supine to sit: Mod assist        Transfers Overall transfer level: Needs assistance Equipment used: Rolling walker (2 wheeled) Transfers: Stand Pivot Transfers Sit to Stand: Mod assist Stand pivot transfers: Mod assist          Ambulation/Gait Ambulation/Gait assistance: Max assist Ambulation Distance (Feet): 5 Feet Assistive device: Rolling walker (2 wheeled) Gait Pattern/deviations: Decreased stance time - left;Decreased stride length;Decreased stance time - right;Decreased step length - right;Decreased step length - left   Gait velocity interpretation: Below normal speed for age/gender General Gait Details: Limited secondary to BLE weakness, SOB   Stairs             Wheelchair Mobility    Modified Rankin (Stroke Patients Only)       Balance Overall balance assessment: Needs assistance Sitting-balance support: Feet supported Sitting balance-Leahy Scale: Fair     Standing balance support: Bilateral upper extremity supported Standing balance-Leahy Scale: Poor Standing balance comment: limited secondary to generalized weakness, SOB                            Cognition Arousal/Alertness: Awake/alert Behavior During Therapy: WFL for tasks assessed/performed Overall Cognitive Status: Within Functional Limits for tasks assessed                                        Exercises General Exercises - Lower Extremity Ankle Circles/Pumps: 10 reps Short Arc Quad: 10 reps Long Arc Quad: 10 reps Heel Slides: 10 reps Hip ABduction/ADduction: 10 reps Hip Flexion/Marching: 10 reps    General Comments        Pertinent Vitals/Pain Pain Assessment: No/denies pain    Home Living                      Prior Function            PT Goals (current goals can now be found in the care plan section) Acute Rehab PT Goals Patient Stated Goal: return home PT Goal Formulation: With patient Time For Goal  Achievement: 05/17/17 Potential to Achieve Goals: Good Progress towards PT goals: Progressing toward goals    Frequency    Min 3X/week      PT Plan Current plan remains appropriate    Co-evaluation              AM-PAC PT "6 Clicks" Daily Activity  Outcome Measure  Difficulty turning over in bed (including adjusting bedclothes, sheets and blankets)?: A Lot Difficulty moving from lying on back to sitting on the side of the bed? : A Lot Difficulty sitting down on and standing up from a chair with arms (e.g., wheelchair, bedside commode, etc,.)?: A Lot Help needed moving to and from a bed to chair (including a wheelchair)?: A Lot Help needed walking in hospital room?: Total Help needed climbing 3-5  steps with a railing? : Total 6 Click Score: 10    End of Session Equipment Utilized During Treatment: Gait belt;Oxygen Activity Tolerance: Patient limited by fatigue Patient left: in bed;with call bell/phone within reach Nurse Communication: Mobility status PT Visit Diagnosis: Unsteadiness on feet (R26.81);Other abnormalities of gait and mobility (R26.89);Muscle weakness (generalized) (M62.81)     Time: 1062-6948 PT Time Calculation (min) (ACUTE ONLY): 31 min  Charges:  $Therapeutic Activity: 23-37 mins                    G Codes:        Ocie Bob, MPT 05/10/2017, 3:12 PM

## 2017-05-10 NOTE — Care Management Important Message (Signed)
Important Message  Patient Details  Name: Yolanda [name] MRN: [id number] Date of Birth: [birth year]   Medicare Important Message Given:  Yes    Malcolm Metro, RN 05/10/2017, 11:44 AM

## 2017-05-11 DIAGNOSIS — D638 Anemia in other chronic diseases classified elsewhere: Secondary | ICD-10-CM

## 2017-05-11 DIAGNOSIS — R197 Diarrhea, unspecified: Secondary | ICD-10-CM

## 2017-05-11 LAB — RENAL FUNCTION PANEL
ANION GAP: 12 (ref 5–15)
Albumin: 3 g/dL — ABNORMAL LOW (ref 3.5–5.0)
BUN: 19 mg/dL (ref 6–20)
CALCIUM: 7.8 mg/dL — AB (ref 8.9–10.3)
CO2: 29 mmol/L (ref 22–32)
CREATININE: 3.4 mg/dL — AB (ref 0.44–1.00)
Chloride: 85 mmol/L — ABNORMAL LOW (ref 101–111)
GFR, EST AFRICAN AMERICAN: 14 mL/min — AB (ref >60)
GFR, EST NON AFRICAN AMERICAN: 12 mL/min — AB (ref >60)
Glucose, Bld: 79 mg/dL (ref 65–99)
Phosphorus: 4.6 mg/dL (ref 2.5–4.6)
Potassium: 3.9 mmol/L (ref 3.5–5.1)
SODIUM: 126 mmol/L — AB (ref 135–145)

## 2017-05-11 LAB — GLUCOSE, CAPILLARY
GLUCOSE-CAPILLARY: 106 mg/dL — AB (ref 65–99)
Glucose-Capillary: 82 mg/dL (ref 65–99)
Glucose-Capillary: 92 mg/dL (ref 65–99)
Glucose-Capillary: 95 mg/dL (ref 65–99)

## 2017-05-11 LAB — HEPATIC FUNCTION PANEL
ALBUMIN: 3.2 g/dL — AB (ref 3.5–5.0)
ALK PHOS: 122 U/L (ref 38–126)
ALT: 21 U/L (ref 14–54)
AST: 39 U/L (ref 15–41)
BILIRUBIN INDIRECT: 1.1 mg/dL — AB (ref 0.3–0.9)
Bilirubin, Direct: 1.3 mg/dL — ABNORMAL HIGH (ref 0.1–0.5)
TOTAL PROTEIN: 5.4 g/dL — AB (ref 6.5–8.1)
Total Bilirubin: 2.4 mg/dL — ABNORMAL HIGH (ref 0.3–1.2)

## 2017-05-11 LAB — CBC
HCT: 23.3 % — ABNORMAL LOW (ref 36.0–46.0)
HEMOGLOBIN: 7.8 g/dL — AB (ref 12.0–15.0)
MCH: 32.1 pg (ref 26.0–34.0)
MCHC: 33.5 g/dL (ref 30.0–36.0)
MCV: 95.9 fL (ref 78.0–100.0)
PLATELETS: 184 10*3/uL (ref 150–400)
RBC: 2.43 MIL/uL — AB (ref 3.87–5.11)
RDW: 16.6 % — ABNORMAL HIGH (ref 11.5–15.5)
WBC: 6.6 10*3/uL (ref 4.0–10.5)

## 2017-05-11 MED ORDER — FUROSEMIDE 10 MG/ML IJ SOLN
80.0000 mg | Freq: Two times a day (BID) | INTRAMUSCULAR | Status: DC
  Administered 2017-05-11: 80 mg via INTRAVENOUS
  Filled 2017-05-11: qty 8

## 2017-05-11 MED ORDER — METOCLOPRAMIDE HCL 10 MG PO TABS
10.0000 mg | ORAL_TABLET | Freq: Three times a day (TID) | ORAL | Status: DC
  Administered 2017-05-11 – 2017-05-13 (×10): 10 mg via ORAL
  Filled 2017-05-11 (×11): qty 1

## 2017-05-11 NOTE — Progress Notes (Signed)
Subjective: She says she feels better. Her renal function continues to get a little worse. No other new complaints. Her breathing is okay.  Objective: Vital signs in last 24 hours: Temp:  [97.7 F (36.5 C)-98.3 F (36.8 C)] 97.7 F (36.5 C) (08/18 0536) Pulse Rate:  [72-81] 72 (08/18 0536) Resp:  [16-17] 16 (08/18 0536) BP: (108-111)/(46-68) 111/50 (08/18 0536) SpO2:  [91 %-99 %] 99 % (08/18 0536) Weight change:  Last BM Date: 05/09/17  Intake/Output from previous day: 08/17 0701 - 08/18 0700 In: 340 [P.O.:240; IV Piggyback:100] Out: -   PHYSICAL EXAM General appearance: alert, cooperative and no distress Resp: clear to auscultation bilaterally Cardio: regular rate and rhythm, S1, S2 normal, no murmur, click, rub or gallop GI: soft, non-tender; bowel sounds normal; no masses,  no organomegaly Extremities: Third spacing of fluids Skin warm and dry  Lab Results:  Results for orders placed or performed during the hospital encounter of 04/29/17 (from the past 48 hour(s))  Glucose, capillary     Status: None   Collection Time: 05/09/17 11:48 AM  Result Value Ref Range   Glucose-Capillary 93 65 - 99 mg/dL   Comment 1 Notify RN    Comment 2 Document in Chart   Glucose, capillary     Status: Abnormal   Collection Time: 05/09/17  4:51 PM  Result Value Ref Range   Glucose-Capillary 112 (H) 65 - 99 mg/dL   Comment 1 Notify RN    Comment 2 Document in Chart   Glucose, capillary     Status: Abnormal   Collection Time: 05/09/17  9:55 PM  Result Value Ref Range   Glucose-Capillary 104 (H) 65 - 99 mg/dL  CBC with Differential/Platelet     Status: Abnormal   Collection Time: 05/10/17  4:52 AM  Result Value Ref Range   WBC 6.4 4.0 - 10.5 K/uL   RBC 2.45 (L) 3.87 - 5.11 MIL/uL   Hemoglobin 7.9 (L) 12.0 - 15.0 g/dL   HCT 23.8 (L) 36.0 - 46.0 %   MCV 97.1 78.0 - 100.0 fL   MCH 32.2 26.0 - 34.0 pg   MCHC 33.2 30.0 - 36.0 g/dL   RDW 17.0 (H) 11.5 - 15.5 %   Platelets 184 150 - 400  K/uL   Neutrophils Relative % 60 %   Neutro Abs 3.8 1.7 - 7.7 K/uL   Lymphocytes Relative 23 %   Lymphs Abs 1.5 0.7 - 4.0 K/uL   Monocytes Relative 11 %   Monocytes Absolute 0.7 0.1 - 1.0 K/uL   Eosinophils Relative 6 %   Eosinophils Absolute 0.4 0.0 - 0.7 K/uL   Basophils Relative 0 %   Basophils Absolute 0.0 0.0 - 0.1 K/uL  Basic metabolic panel     Status: Abnormal   Collection Time: 05/10/17  4:52 AM  Result Value Ref Range   Sodium 128 (L) 135 - 145 mmol/L   Potassium 3.7 3.5 - 5.1 mmol/L   Chloride 87 (L) 101 - 111 mmol/L   CO2 29 22 - 32 mmol/L   Glucose, Bld 85 65 - 99 mg/dL   BUN 19 6 - 20 mg/dL   Creatinine, Ser 3.02 (H) 0.44 - 1.00 mg/dL   Calcium 7.4 (L) 8.9 - 10.3 mg/dL   GFR calc non Af Amer 14 (L) >60 mL/min   GFR calc Af Amer 16 (L) >60 mL/min    Comment: (NOTE) The eGFR has been calculated using the CKD EPI equation. This calculation has not been validated  in all clinical situations. eGFR's persistently <60 mL/min signify possible Chronic Kidney Disease.    Anion gap 12 5 - 15  Glucose, capillary     Status: None   Collection Time: 05/10/17  7:44 AM  Result Value Ref Range   Glucose-Capillary 92 65 - 99 mg/dL   Comment 1 Notify RN    Comment 2 Document in Chart   Glucose, capillary     Status: Abnormal   Collection Time: 05/10/17 11:34 AM  Result Value Ref Range   Glucose-Capillary 100 (H) 65 - 99 mg/dL   Comment 1 Notify RN    Comment 2 Document in Chart   Glucose, capillary     Status: Abnormal   Collection Time: 05/10/17  4:50 PM  Result Value Ref Range   Glucose-Capillary 112 (H) 65 - 99 mg/dL   Comment 1 Notify RN    Comment 2 Document in Chart   Glucose, capillary     Status: None   Collection Time: 05/11/17 12:25 AM  Result Value Ref Range   Glucose-Capillary 92 65 - 99 mg/dL   Comment 1 Notify RN    Comment 2 Document in Chart   Renal function panel     Status: Abnormal   Collection Time: 05/11/17  6:59 AM  Result Value Ref Range    Sodium 126 (L) 135 - 145 mmol/L   Potassium 3.9 3.5 - 5.1 mmol/L   Chloride 85 (L) 101 - 111 mmol/L   CO2 29 22 - 32 mmol/L   Glucose, Bld 79 65 - 99 mg/dL   BUN 19 6 - 20 mg/dL   Creatinine, Ser 2.23 (H) 0.44 - 1.00 mg/dL   Calcium 7.8 (L) 8.9 - 10.3 mg/dL   Phosphorus 4.6 2.5 - 4.6 mg/dL   Albumin 3.0 (L) 3.5 - 5.0 g/dL   GFR calc non Af Amer 12 (L) >60 mL/min   GFR calc Af Amer 14 (L) >60 mL/min    Comment: (NOTE) The eGFR has been calculated using the CKD EPI equation. This calculation has not been validated in all clinical situations. eGFR's persistently <60 mL/min signify possible Chronic Kidney Disease.    Anion gap 12 5 - 15  CBC     Status: Abnormal   Collection Time: 05/11/17  6:59 AM  Result Value Ref Range   WBC 6.6 4.0 - 10.5 K/uL   RBC 2.43 (L) 3.87 - 5.11 MIL/uL   Hemoglobin 7.8 (L) 12.0 - 15.0 g/dL   HCT 31.7 (L) 42.1 - 32.1 %   MCV 95.9 78.0 - 100.0 fL   MCH 32.1 26.0 - 34.0 pg   MCHC 33.5 30.0 - 36.0 g/dL   RDW 39.4 (H) 38.4 - 80.8 %   Platelets 184 150 - 400 K/uL  Hepatic function panel     Status: Abnormal   Collection Time: 05/11/17  6:59 AM  Result Value Ref Range   Total Protein 5.4 (L) 6.5 - 8.1 g/dL   Albumin 3.2 (L) 3.5 - 5.0 g/dL   AST 39 15 - 41 U/L   ALT 21 14 - 54 U/L   Alkaline Phosphatase 122 38 - 126 U/L   Total Bilirubin 2.4 (H) 0.3 - 1.2 mg/dL   Bilirubin, Direct 1.3 (H) 0.1 - 0.5 mg/dL   Indirect Bilirubin 1.1 (H) 0.3 - 0.9 mg/dL  Glucose, capillary     Status: None   Collection Time: 05/11/17  7:33 AM  Result Value Ref Range   Glucose-Capillary 82 65 - 99  mg/dL    ABGS No results for input(s): PHART, PO2ART, TCO2, HCO3 in the last 72 hours.  Invalid input(s): PCO2 CULTURES Recent Results (from the past 240 hour(s))  C difficile quick scan w PCR reflex     Status: None   Collection Time: 05/07/17 10:00 AM  Result Value Ref Range Status   C Diff antigen NEGATIVE NEGATIVE Final   C Diff toxin NEGATIVE NEGATIVE Final   C Diff  interpretation No C. difficile detected.  Final  Gastrointestinal Panel by PCR , Stool     Status: None   Collection Time: 05/08/17 10:51 AM  Result Value Ref Range Status   Campylobacter species NOT DETECTED NOT DETECTED Final   Plesimonas shigelloides NOT DETECTED NOT DETECTED Final   Salmonella species NOT DETECTED NOT DETECTED Final   Yersinia enterocolitica NOT DETECTED NOT DETECTED Final   Vibrio species NOT DETECTED NOT DETECTED Final   Vibrio cholerae NOT DETECTED NOT DETECTED Final   Enteroaggregative E coli (EAEC) NOT DETECTED NOT DETECTED Final   Enteropathogenic E coli (EPEC) NOT DETECTED NOT DETECTED Final   Enterotoxigenic E coli (ETEC) NOT DETECTED NOT DETECTED Final   Shiga like toxin producing E coli (STEC) NOT DETECTED NOT DETECTED Final   Shigella/Enteroinvasive E coli (EIEC) NOT DETECTED NOT DETECTED Final   Cryptosporidium NOT DETECTED NOT DETECTED Final   Cyclospora cayetanensis NOT DETECTED NOT DETECTED Final   Entamoeba histolytica NOT DETECTED NOT DETECTED Final   Giardia lamblia NOT DETECTED NOT DETECTED Final   Adenovirus F40/41 NOT DETECTED NOT DETECTED Final   Astrovirus NOT DETECTED NOT DETECTED Final   Norovirus GI/GII NOT DETECTED NOT DETECTED Final   Rotavirus A NOT DETECTED NOT DETECTED Final   Sapovirus (I, II, IV, and V) NOT DETECTED NOT DETECTED Final   Studies/Results: No results found.  Medications:  Prior to Admission:  Prescriptions Prior to Admission  Medication Sig Dispense Refill Last Dose  . acetaminophen (TYLENOL) 325 MG tablet Take 2 tablets (650 mg total) by mouth every 6 (six) hours as needed for mild pain (or Fever >/= 101).   Past Week at Unknown time  . albuterol (PROVENTIL HFA;VENTOLIN HFA) 108 (90 Base) MCG/ACT inhaler Inhale 2 puffs into the lungs 4 (four) times daily.    04/29/2017 at Unknown time  . apixaban (ELIQUIS) 5 MG TABS tablet Take 1 tablet (5 mg total) by mouth 2 (two) times daily. 60 tablet 5 04/29/2017 at 1030-1100a  .  calcium carbonate (TUMS - DOSED IN MG ELEMENTAL CALCIUM) 500 MG chewable tablet Chew 1 tablet by mouth 2 (two) times daily.   04/29/2017 at Unknown time  . carboxymethylcellulose (REFRESH PLUS) 0.5 % SOLN Apply 1 drop to eye daily as needed (for dry eyes).   Past Week at Unknown time  . cetirizine (ZYRTEC) 10 MG tablet Take 10 mg by mouth daily.   04/29/2017 at Unknown time  . cholecalciferol (VITAMIN D) 1000 units tablet Take 1,000 Units by mouth every morning.   04/29/2017 at Unknown time  . colchicine 0.6 MG tablet Take 1 tablet (0.6 mg total) by mouth daily. 30 tablet 0 04/29/2017 at Unknown time  . diltiazem (CARDIZEM CD) 120 MG 24 hr capsule Take 1 capsule (120 mg total) by mouth daily. 90 capsule 3 04/29/2017 at Unknown time  . escitalopram (LEXAPRO) 20 MG tablet Take 1 tablet (20 mg total) by mouth daily. 30 tablet 12 04/29/2017 at Unknown time  . fluticasone furoate-vilanterol (BREO ELLIPTA) 200-25 MCG/INH AEPB Inhale 1 puff into  the lungs daily.   04/29/2017 at Unknown time  . furosemide (LASIX) 20 MG tablet Take 1 tablet (20 mg total) by mouth daily. 30 tablet 0 04/29/2017 at Unknown time  . gabapentin (NEURONTIN) 100 MG capsule Take 2 capsules (200 mg total) by mouth at bedtime. 30 capsule 0 04/28/2017 at Unknown time  . levothyroxine (SYNTHROID, LEVOTHROID) 137 MCG tablet Take 137 mcg by mouth daily before breakfast.   04/29/2017 at Unknown time  . loperamide (IMODIUM) 2 MG capsule Take 1 capsule (2 mg total) by mouth every morning. (Patient taking differently: Take 2 mg by mouth every morning. *May take up to 4 times daily as needed for loose stools and diarrhea) 90 capsule 5 Past Week at Unknown time  . metoprolol tartrate (LOPRESSOR) 25 MG tablet TAKE 1/2 TABLET BY MOUTH TWICE DAILY 90 tablet 1 04/29/2017 at 1030-1100a  . omeprazole (PRILOSEC) 20 MG capsule TAKE ONE CAPSULE BY MOUTH DAILY 90 capsule 3 04/29/2017 at Unknown time  . Pediatric Multivitamins-Iron (FLINTSTONES PLUS IRON) chewable tablet Chew 1  tablet by mouth 2 (two) times daily.   04/29/2017 at Unknown time  . potassium chloride SA (K-DUR,KLOR-CON) 20 MEQ tablet Take 1 tablet (20 mEq total) by mouth daily. 30 tablet 0 04/29/2017 at Unknown time  . sodium bicarbonate 650 MG tablet Take 1 tablet (650 mg total) by mouth 2 (two) times daily. 60 tablet 0 04/29/2017 at Unknown time  . triamcinolone (NASACORT ALLERGY 24HR) 55 MCG/ACT AERO nasal inhaler Place 2 sprays into the nose daily.   unknown  . vancomycin (VANCOCIN) 50 mg/mL oral solution Take 2.5 mLs (125 mg total) by mouth 2 (two) times daily. 70 mL 0 04/28/2017 at Unknown time  . allopurinol (ZYLOPRIM) 100 MG tablet Take 150 mg by mouth daily.  0   . Amino Acids-Protein Hydrolys (FEEDING SUPPLEMENT, PRO-STAT SUGAR FREE 64,) LIQD Take 30 mLs by mouth 2 (two) times daily. (Patient not taking: Reported on 04/29/2017) 900 mL 0 Not Taking at Unknown time  . Doxepin HCl (SILENOR) 6 MG TABS Take 1 tablet (6 mg total) by mouth at bedtime as needed (for sleep). (Patient not taking: Reported on 04/29/2017) 10 tablet 0 Not Taking at Unknown time  . insulin glargine (LANTUS) 100 UNIT/ML injection Inject 0.2 mLs (20 Units total) into the skin daily. (Patient not taking: Reported on 04/29/2017) 10 mL 11 Not Taking at Unknown time  . ondansetron (ZOFRAN-ODT) 4 MG disintegrating tablet Take 1 tablet (4 mg total) by mouth 3 (three) times daily before meals. (Patient not taking: Reported on 04/29/2017) 20 tablet 0 Not Taking at Unknown time   Scheduled: . albuterol  3 mL Inhalation BID  . allopurinol  150 mg Oral Daily  . apixaban  5 mg Oral BID  . calcium carbonate  1 tablet Oral BID  . cholecalciferol  1,000 Units Oral q morning - 10a  . colchicine  0.6 mg Oral Q48H  . diltiazem  120 mg Oral Daily  . doxycycline  100 mg Oral Q12H  . escitalopram  20 mg Oral Daily  . feeding supplement  1 Container Oral TID BM  . feeding supplement (PRO-STAT SUGAR FREE 64)  30 mL Oral BID  . fluticasone  2 spray Each Nare Daily   . fluticasone furoate-vilanterol  1 puff Inhalation Daily  . furosemide  60 mg Intravenous BID  . gabapentin  200 mg Oral QHS  . insulin aspart  0-9 Units Subcutaneous TID WC  . levothyroxine  137 mcg Oral  QAC breakfast  . loperamide  2 mg Oral BID AC  . metoCLOPramide (REGLAN) injection  5 mg Intravenous Q6H  . metoprolol tartrate  12.5 mg Oral BID  . multivitamin with minerals  1 tablet Oral BID  . ondansetron  8 mg Oral TID AC & HS  . pantoprazole  40 mg Oral QAC breakfast  . sodium bicarbonate  650 mg Oral BID  . sodium chloride flush  3 mL Intravenous Q12H   Continuous: . sodium chloride    . sodium chloride Stopped (05/10/17 1448)  . albumin human     OIN:OMVEHM chloride, acetaminophen **OR** acetaminophen, acetaminophen, ondansetron (ZOFRAN) IV, polyvinyl alcohol, sodium chloride flush  Assesment: She was admitted with cellulitis. She has multiple other medical problems. She developed acute on chronic diastolic heart failure which was treated and then she developed acute on chronic renal failure. She has malnutrition at baseline. She is substantially anemic but I'm trying to avoid giving her blood at this point because of volume overload. She's asymptomatic from her anemia. She is very weak. Skilled care facility has been suggested Principal Problem:   Cellulitis Active Problems:   HTN (hypertension)   COPD (chronic obstructive pulmonary disease) (HCC)   PAF (paroxysmal atrial fibrillation) (HCC)   Chronic anticoagulation   Chronic edema   Obesity   Diabetes mellitus type 2 in obese (HCC)   Chronic diastolic CHF (congestive heart failure) ([place])   Hypoalbuminemia    Plan: Continue current treatments    LOS: 12 days   Erich Kochan L 05/11/2017, 8:20 AM

## 2017-05-11 NOTE — Progress Notes (Signed)
She reports eating about a third of her breakfast tray including bacon and eggs.  Some continued sensation of nausea but no vomiting. No obvious GI bleeding.  No side effects from Reglan thus far   Vital signs in last 24 hours: Temp:  [97.7 F (36.5 C)-98.3 F (36.8 C)] 97.7 F (36.5 C) (08/18 0536) Pulse Rate:  [72-81] 72 (08/18 0536) Resp:  [16-17] 16 (08/18 0536) BP: (108-111)/(46-68) 111/50 (08/18 0536) SpO2:  [91 %-99 %] 99 % (08/18 0536) Last BM Date: 05/09/17 General:   Alert,   pleasant and cooperative in NAD Abdomen:  Nondistended. Positive bowel sounds. No succussion splash. Soft and nontender    Intake/Output from previous day: 08/17 0701 - 08/18 0700 In: 340 [P.O.:240; IV Piggyback:100] Out: -  Intake/Output this shift: No intake/output data recorded.  Lab Results:  Recent Labs  05/09/17 0812 05/10/17 0452 05/11/17 0659  WBC 6.5 6.4 6.6  HGB 8.1* 7.9* 7.8*  HCT 24.0* 23.8* 23.3*  PLT 173 184 184   BMET  Recent Labs  05/09/17 0812 05/10/17 0452 05/11/17 0659  NA 130* 128* 126*  K 3.7 3.7 3.9  CL 89* 87* 85*  CO2 31 29 29   GLUCOSE 83 85 79  BUN 18 19 19   CREATININE 2.81* 3.02* 3.40*  CALCIUM 7.2* 7.4* 7.8*   LFT  Recent Labs  05/11/17 0659  PROT 5.4*  ALBUMIN 3.2*  3.0*  AST 39  ALT 21  ALKPHOS 122  BILITOT 2.4*  BILIDIR 1.3*  IBILI 1.1*    Impression:    76 year old lady with multiple issues including cellulitis, renal failure, liver disease, diarrhea and 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  LFTs are stable. Renal failure somewhat worsened-being managed by nephrology  Some improvement in nausea with low-dose IV Reglan without apparent side effects.   Recommendations: We'll go ahead and switch Reglan to the oral route and increase to 10 mg before meals and at bedtime, continuing to watch for side effects closely  Continue PPI and when necessary Zofran.    Imodium for any recurrent diarrhea.

## 2017-05-11 NOTE — Progress Notes (Signed)
Started patient on incentive spirometry, Noted that patients saturation is slowly decreasing and oxygen is increasing from 1 to 2 liters. Suspect atelectasis and fluid retention still problem due to kidney failure and low albumin.

## 2017-05-11 NOTE — Progress Notes (Signed)
Subjective: Interval History: has no complaint of difficulty breathing. She didn't have any nausea or vomiting. Her appetite however is not good.  Objective: Vital signs in last 24 hours: Temp:  [97.7 F (36.5 C)-98.3 F (36.8 C)] 97.7 F (36.5 C) (08/18 0536) Pulse Rate:  [72-81] 72 (08/18 0536) Resp:  [16-17] 16 (08/18 0536) BP: (108-111)/(46-68) 111/50 (08/18 0536) SpO2:  [91 %-99 %] 99 % (08/18 0536) Weight change:   Intake/Output from previous day: 08/17 0701 - 08/18 0700 In: 340 [P.O.:240; IV Piggyback:100] Out: -  Intake/Output this shift: No intake/output data recorded.  Generally patient is alert and in no apparent distress Chest: Decreased breath sound bilaterally Heart exam regular rate and rhythm no murmur Extremities she has trace to 1+ edema  Lab Results:  Recent Labs  05/10/17 0452 05/11/17 0659  WBC 6.4 6.6  HGB 7.9* 7.8*  HCT 23.8* 23.3*  PLT 184 184   BMET:  Recent Labs  05/10/17 0452 05/11/17 0659  NA 128* 126*  K 3.7 3.9  CL 87* 85*  CO2 29 29  GLUCOSE 85 79  BUN 19 19  CREATININE 3.02* 3.40*  CALCIUM 7.4* 7.8*   No results for input(s): PTH in the last 72 hours. Iron Studies: No results for input(s): IRON, TIBC, TRANSFERRIN, FERRITIN in the last 72 hours.  Studies/Results: No results found.  I have reviewed the patient's current medications.  Assessment/Plan: Problem #1 acute kidney injury superimposed on chronic. Presently her BUN and creatinine is increasing. Patient was not started on IV fluid which was ordered the day before yesterday. Etiology was thought to be secondary to prerenal syndrome/ATN/possible contrast induced acute kidney injury. Presently patient doesn't have any nausea or vomiting. Problem #2 chronic renal failure stage III Problem #3 anemia: Her hemoglobin is below target goal.  Problem #4 hypertension: Her blood pressure is reasonably controlled Problem #5 history of cellulitis Problem #6 history of  COPD Problem #7 bone and mineral disorder: Her calcium and phosphorus is range Problem #8 hyponatremia: Hypervolemic hyponatremia Plan: We'll start patient on normal saline at 1 25 mL per hour 2] we'll give her Lasix 80 mg IV twice a day 3] we'll restrict her free water intake to 500 mL per day 4] we'll check her renal panel and CBC in the morning.   LOS: 12 days   Tyquavious Gamel S 05/11/2017,9:01 AM

## 2017-05-12 DIAGNOSIS — R110 Nausea: Secondary | ICD-10-CM

## 2017-05-12 LAB — CBC WITH DIFFERENTIAL/PLATELET
Basophils Absolute: 0 10*3/uL (ref 0.0–0.1)
Basophils Relative: 0 %
Eosinophils Absolute: 0.3 10*3/uL (ref 0.0–0.7)
Eosinophils Relative: 3 %
HEMATOCRIT: 23.4 % — AB (ref 36.0–46.0)
Hemoglobin: 7.8 g/dL — ABNORMAL LOW (ref 12.0–15.0)
LYMPHS ABS: 1.5 10*3/uL (ref 0.7–4.0)
LYMPHS PCT: 14 %
MCH: 32 pg (ref 26.0–34.0)
MCHC: 33.3 g/dL (ref 30.0–36.0)
MCV: 95.9 fL (ref 78.0–100.0)
MONO ABS: 0.8 10*3/uL (ref 0.1–1.0)
Monocytes Relative: 8 %
NEUTROS ABS: 7.7 10*3/uL (ref 1.7–7.7)
NEUTROS PCT: 75 %
Platelets: 205 10*3/uL (ref 150–400)
RBC: 2.44 MIL/uL — AB (ref 3.87–5.11)
RDW: 16.4 % — ABNORMAL HIGH (ref 11.5–15.5)
WBC: 10.3 10*3/uL (ref 4.0–10.5)

## 2017-05-12 LAB — GLUCOSE, CAPILLARY
GLUCOSE-CAPILLARY: 99 mg/dL (ref 65–99)
GLUCOSE-CAPILLARY: 102 mg/dL — AB (ref 65–99)
Glucose-Capillary: 88 mg/dL (ref 65–99)
Glucose-Capillary: 101 mg/dL — ABNORMAL HIGH (ref 65–99)

## 2017-05-12 LAB — RENAL FUNCTION PANEL
ANION GAP: 11 (ref 5–15)
Albumin: 3.5 g/dL (ref 3.5–5.0)
BUN: 19 mg/dL (ref 6–20)
CALCIUM: 7.7 mg/dL — AB (ref 8.9–10.3)
CO2: 27 mmol/L (ref 22–32)
Chloride: 87 mmol/L — ABNORMAL LOW (ref 101–111)
Creatinine, Ser: 3.47 mg/dL — ABNORMAL HIGH (ref 0.44–1.00)
GFR, EST AFRICAN AMERICAN: 14 mL/min — AB (ref >60)
GFR, EST NON AFRICAN AMERICAN: 12 mL/min — AB (ref >60)
Glucose, Bld: 81 mg/dL (ref 65–99)
PHOSPHORUS: 4.2 mg/dL (ref 2.5–4.6)
Potassium: 3.8 mmol/L (ref 3.5–5.1)
Sodium: 125 mmol/L — ABNORMAL LOW (ref 135–145)

## 2017-05-12 MED ORDER — FUROSEMIDE 10 MG/ML IJ SOLN
120.0000 mg | Freq: Two times a day (BID) | INTRAVENOUS | Status: DC
  Administered 2017-05-12 – 2017-05-13 (×2): 120 mg via INTRAVENOUS
  Filled 2017-05-12 (×2): qty 12

## 2017-05-12 NOTE — Progress Notes (Signed)
Eating only a small portion of her trays. Complains of some persisting nausea but no vomiting. No apparent side effects on increased dose of Reglan.  No diarrhea.  Vital signs in last 24 hours: Temp:  [97.6 F (36.4 C)-98 F (36.7 C)] 97.6 F (36.4 C) (08/19 0529) Pulse Rate:  [73-80] 80 (08/19 0529) Resp:  [16-18] 18 (08/19 0529) BP: (119-129)/(49-56) 129/56 (08/19 0529) SpO2:  [94 %-98 %] 97 % (08/19 0745) Last BM Date: 05/10/17 General:   pleasant and cooperative in NAD Abdomen:  Nondistended. Positive bowel sounds. No succussion splash. Abdomen soft and nontender   Intake/Output from previous day: 08/18 0701 - 08/19 0700 In: 6363.8 [P.O.:360; I.V.:5803.8; IV Piggyback:200] Out: 200 [Urine:200] Intake/Output this shift: No intake/output data recorded.  Lab Results:  Recent Labs  05/10/17 0452 05/11/17 0659 05/12/17 0546  WBC 6.4 6.6 10.3  HGB 7.9* 7.8* 7.8*  HCT 23.8* 23.3* 23.4*  PLT 184 184 205   BMET  Recent Labs  05/10/17 0452 05/11/17 0659 05/12/17 0546  NA 128* 126* 125*  K 3.7 3.9 3.8  CL 87* 85* 87*  CO2 29 29 27   GLUCOSE 85 79 81  BUN 19 19 19   CREATININE 3.02* 3.40* 3.47*  CALCIUM 7.4* 7.8* 7.7*   LFT  Recent Labs  05/11/17 0659 05/12/17 0546  PROT 5.4*  --   ALBUMIN 3.2*  3.0* 3.5  AST 39  --   ALT 21  --   ALKPHOS 122  --   BILITOT 2.4*  --   BILIDIR 1.3*  --   IBILI 1.1*  --     Impression:  Nausea persists; no vomiting. Tolerating full dose Reglan without any apparent side effects thus far. Fortunately, diarrhea has resolved.  Recommendations: Continue Reglan and PPI.  Watch for recurrent diarrhea.

## 2017-05-12 NOTE — Progress Notes (Signed)
Subjective: Interval History: Patient complains of some nausea and heaving on and off. Patient claims it has been there for some time. At this moment she denies any difficulty breathing. Her appetite is poor  Objective: Vital signs in last 24 hours: Temp:  [97.6 F (36.4 C)-98 F (36.7 C)] 97.6 F (36.4 C) (08/19 0529) Pulse Rate:  [73-80] 80 (08/19 0529) Resp:  [16-18] 18 (08/19 0529) BP: (119-129)/(49-56) 129/56 (08/19 0529) SpO2:  [94 %-98 %] 97 % (08/19 0745) Weight change:   Intake/Output from previous day: 08/18 0701 - 08/19 0700 In: 6363.8 [P.O.:360; I.V.:5803.8; IV Piggyback:200] Out: 200 [Urine:200] Intake/Output this shift: No intake/output data recorded.  Generally patient is alert and in no apparent distress Chest: Decreased breath sound bilaterally Heart exam regular rate and rhythm no murmur Extremities she has 2+ edema  Lab Results:  Recent Labs  05/11/17 0659 05/12/17 0546  WBC 6.6 10.3  HGB 7.8* 7.8*  HCT 23.3* 23.4*  PLT 184 205   BMET:   Recent Labs  05/11/17 0659 05/12/17 0546  NA 126* 125*  K 3.9 3.8  CL 85* 87*  CO2 29 27  GLUCOSE 79 81  BUN 19 19  CREATININE 3.40* 3.47*  CALCIUM 7.8* 7.7*   No results for input(s): PTH in the last 72 hours. Iron Studies: No results for input(s): IRON, TIBC, TRANSFERRIN, FERRITIN in the last 72 hours.  Studies/Results: No results found.  I have reviewed the patient's current medications.  Assessment/Plan: Problem #1 acute kidney injury superimposed on chronic. Presently her BUN and creatinine is increasing. The rate of increase however has slowed down.. Etiology was thought to be secondary to prerenal syndrome/ATN/possible contrast induced acute kidney injury. She has some nausea and poor appetite but claims it has been seen for some time on and off. Problem #2 chronic renal failure stage III Problem #3 anemia: Her hemoglobin is below target goal. Etiology not clear Problem #4 hypertension: Her  blood pressure is reasonably controlled Problem #5 history of cellulitis Problem #6 history of COPD Problem #7 bone and mineral disorder: Her calcium and phosphorus is range Problem #8 hyponatremia: Hypervolemic hyponatremia. Her sodium is still declining possibly from free water intake. Problem #9 history of CHF: She is on Lasix. Her urine output is poor and patient has some edema but denies any difficulty breathing. Plan: We'll decrease IV fluid to 75 mL per hour 2] we'll increase Lasix to 120 mg IV twice a day 3] we'll restrict her free water intake 4] we'll check her renal panel and CBC in the morning. 5] we'll do iron studies   LOS: 13 days   Reid Nawrot S 05/12/2017,8:42 AM

## 2017-05-12 NOTE — Progress Notes (Signed)
Subjective: She says she doesn't feel very well this morning. She's complaining of nausea. She has no other new complaints.  Objective: Vital signs in last 24 hours: Temp:  [97.6 F (36.4 C)-98 F (36.7 C)] 97.6 F (36.4 C) (08/19 0529) Pulse Rate:  [73-80] 80 (08/19 0529) Resp:  [16-18] 18 (08/19 0529) BP: (119-129)/(49-56) 129/56 (08/19 0529) SpO2:  [94 %-98 %] 97 % (08/19 0745) Weight change:  Last BM Date: 05/10/17  Intake/Output from previous day: 08/18 0701 - 08/19 0700 In: 6363.8 [P.O.:360; I.V.:5803.8; IV Piggyback:200] Out: 200 [Urine:200]  PHYSICAL EXAM General appearance: alert, cooperative and mild distress Resp: clear to auscultation bilaterally Cardio: regular rate and rhythm, S1, S2 normal, no murmur, click, rub or gallop GI: soft, non-tender; bowel sounds normal; no masses,  no organomegaly Extremities: venous stasis dermatitis noted She still has third spacing of fluid  Lab Results:  Results for orders placed or performed during the hospital encounter of 04/29/17 (from the past 48 hour(s))  Glucose, capillary     Status: Abnormal   Collection Time: 05/10/17 11:34 AM  Result Value Ref Range   Glucose-Capillary 100 (H) 65 - 99 mg/dL   Comment 1 Notify RN    Comment 2 Document in Chart   Glucose, capillary     Status: Abnormal   Collection Time: 05/10/17  4:50 PM  Result Value Ref Range   Glucose-Capillary 112 (H) 65 - 99 mg/dL   Comment 1 Notify RN    Comment 2 Document in Chart   Glucose, capillary     Status: None   Collection Time: 05/11/17 12:25 AM  Result Value Ref Range   Glucose-Capillary 92 65 - 99 mg/dL   Comment 1 Notify RN    Comment 2 Document in Chart   Renal function panel     Status: Abnormal   Collection Time: 05/11/17  6:59 AM  Result Value Ref Range   Sodium 126 (L) 135 - 145 mmol/L   Potassium 3.9 3.5 - 5.1 mmol/L   Chloride 85 (L) 101 - 111 mmol/L   CO2 29 22 - 32 mmol/L   Glucose, Bld 79 65 - 99 mg/dL   BUN 19 6 - 20 mg/dL    Creatinine, Ser 3.40 (H) 0.44 - 1.00 mg/dL   Calcium 7.8 (L) 8.9 - 10.3 mg/dL   Phosphorus 4.6 2.5 - 4.6 mg/dL   Albumin 3.0 (L) 3.5 - 5.0 g/dL   GFR calc non Af Amer 12 (L) >60 mL/min   GFR calc Af Amer 14 (L) >60 mL/min    Comment: (NOTE) The eGFR has been calculated using the CKD EPI equation. This calculation has not been validated in all clinical situations. eGFR's persistently <60 mL/min signify possible Chronic Kidney Disease.    Anion gap 12 5 - 15  CBC     Status: Abnormal   Collection Time: 05/11/17  6:59 AM  Result Value Ref Range   WBC 6.6 4.0 - 10.5 K/uL   RBC 2.43 (L) 3.87 - 5.11 MIL/uL   Hemoglobin 7.8 (L) 12.0 - 15.0 g/dL   HCT 23.3 (L) 36.0 - 46.0 %   MCV 95.9 78.0 - 100.0 fL   MCH 32.1 26.0 - 34.0 pg   MCHC 33.5 30.0 - 36.0 g/dL   RDW 16.6 (H) 11.5 - 15.5 %   Platelets 184 150 - 400 K/uL  Hepatic function panel     Status: Abnormal   Collection Time: 05/11/17  6:59 AM  Result Value Ref Range  Total Protein 5.4 (L) 6.5 - 8.1 g/dL   Albumin 3.2 (L) 3.5 - 5.0 g/dL   AST 39 15 - 41 U/L   ALT 21 14 - 54 U/L   Alkaline Phosphatase 122 38 - 126 U/L   Total Bilirubin 2.4 (H) 0.3 - 1.2 mg/dL   Bilirubin, Direct 1.3 (H) 0.1 - 0.5 mg/dL   Indirect Bilirubin 1.1 (H) 0.3 - 0.9 mg/dL  Glucose, capillary     Status: None   Collection Time: 05/11/17  7:33 AM  Result Value Ref Range   Glucose-Capillary 82 65 - 99 mg/dL  Glucose, capillary     Status: Abnormal   Collection Time: 05/11/17  4:40 PM  Result Value Ref Range   Glucose-Capillary 106 (H) 65 - 99 mg/dL   Comment 1 Notify RN    Comment 2 Document in Chart   Glucose, capillary     Status: None   Collection Time: 05/11/17  9:16 PM  Result Value Ref Range   Glucose-Capillary 95 65 - 99 mg/dL   Comment 1 Notify RN    Comment 2 Document in Chart   Renal function panel     Status: Abnormal   Collection Time: 05/12/17  5:46 AM  Result Value Ref Range   Sodium 125 (L) 135 - 145 mmol/L   Potassium 3.8 3.5 -  5.1 mmol/L   Chloride 87 (L) 101 - 111 mmol/L   CO2 27 22 - 32 mmol/L   Glucose, Bld 81 65 - 99 mg/dL   BUN 19 6 - 20 mg/dL   Creatinine, Ser 3.47 (H) 0.44 - 1.00 mg/dL   Calcium 7.7 (L) 8.9 - 10.3 mg/dL   Phosphorus 4.2 2.5 - 4.6 mg/dL   Albumin 3.5 3.5 - 5.0 g/dL   GFR calc non Af Amer 12 (L) >60 mL/min   GFR calc Af Amer 14 (L) >60 mL/min    Comment: (NOTE) The eGFR has been calculated using the CKD EPI equation. This calculation has not been validated in all clinical situations. eGFR's persistently <60 mL/min signify possible Chronic Kidney Disease.    Anion gap 11 5 - 15  CBC with Differential/Platelet     Status: Abnormal   Collection Time: 05/12/17  5:46 AM  Result Value Ref Range   WBC 10.3 4.0 - 10.5 K/uL   RBC 2.44 (L) 3.87 - 5.11 MIL/uL   Hemoglobin 7.8 (L) 12.0 - 15.0 g/dL   HCT 23.4 (L) 36.0 - 46.0 %   MCV 95.9 78.0 - 100.0 fL   MCH 32.0 26.0 - 34.0 pg   MCHC 33.3 30.0 - 36.0 g/dL   RDW 16.4 (H) 11.5 - 15.5 %   Platelets 205 150 - 400 K/uL   Neutrophils Relative % 75 %   Neutro Abs 7.7 1.7 - 7.7 K/uL   Lymphocytes Relative 14 %   Lymphs Abs 1.5 0.7 - 4.0 K/uL   Monocytes Relative 8 %   Monocytes Absolute 0.8 0.1 - 1.0 K/uL   Eosinophils Relative 3 %   Eosinophils Absolute 0.3 0.0 - 0.7 K/uL   Basophils Relative 0 %   Basophils Absolute 0.0 0.0 - 0.1 K/uL  Glucose, capillary     Status: None   Collection Time: 05/12/17  7:28 AM  Result Value Ref Range   Glucose-Capillary 88 65 - 99 mg/dL   Comment 1 Notify RN    Comment 2 Document in Chart     ABGS No results for input(s): PHART, PO2ART, TCO2, HCO3 in  the last 72 hours.  Invalid input(s): PCO2 CULTURES Recent Results (from the past 240 hour(s))  C difficile quick scan w PCR reflex     Status: None   Collection Time: 05/07/17 10:00 AM  Result Value Ref Range Status   C Diff antigen NEGATIVE NEGATIVE Final   C Diff toxin NEGATIVE NEGATIVE Final   C Diff interpretation No C. difficile detected.   Final  Gastrointestinal Panel by PCR , Stool     Status: None   Collection Time: 05/08/17 10:51 AM  Result Value Ref Range Status   Campylobacter species NOT DETECTED NOT DETECTED Final   Plesimonas shigelloides NOT DETECTED NOT DETECTED Final   Salmonella species NOT DETECTED NOT DETECTED Final   Yersinia enterocolitica NOT DETECTED NOT DETECTED Final   Vibrio species NOT DETECTED NOT DETECTED Final   Vibrio cholerae NOT DETECTED NOT DETECTED Final   Enteroaggregative E coli (EAEC) NOT DETECTED NOT DETECTED Final   Enteropathogenic E coli (EPEC) NOT DETECTED NOT DETECTED Final   Enterotoxigenic E coli (ETEC) NOT DETECTED NOT DETECTED Final   Shiga like toxin producing E coli (STEC) NOT DETECTED NOT DETECTED Final   Shigella/Enteroinvasive E coli (EIEC) NOT DETECTED NOT DETECTED Final   Cryptosporidium NOT DETECTED NOT DETECTED Final   Cyclospora cayetanensis NOT DETECTED NOT DETECTED Final   Entamoeba histolytica NOT DETECTED NOT DETECTED Final   Giardia lamblia NOT DETECTED NOT DETECTED Final   Adenovirus F40/41 NOT DETECTED NOT DETECTED Final   Astrovirus NOT DETECTED NOT DETECTED Final   Norovirus GI/GII NOT DETECTED NOT DETECTED Final   Rotavirus A NOT DETECTED NOT DETECTED Final   Sapovirus (I, II, IV, and V) NOT DETECTED NOT DETECTED Final   Studies/Results: No results found.  Medications:  Prior to Admission:  Prescriptions Prior to Admission  Medication Sig Dispense Refill Last Dose  . acetaminophen (TYLENOL) 325 MG tablet Take 2 tablets (650 mg total) by mouth every 6 (six) hours as needed for mild pain (or Fever >/= 101).   Past Week at Unknown time  . albuterol (PROVENTIL HFA;VENTOLIN HFA) 108 (90 Base) MCG/ACT inhaler Inhale 2 puffs into the lungs 4 (four) times daily.    04/29/2017 at Unknown time  . apixaban (ELIQUIS) 5 MG TABS tablet Take 1 tablet (5 mg total) by mouth 2 (two) times daily. 60 tablet 5 04/29/2017 at 1030-1100a  . calcium carbonate (TUMS - DOSED IN MG  ELEMENTAL CALCIUM) 500 MG chewable tablet Chew 1 tablet by mouth 2 (two) times daily.   04/29/2017 at Unknown time  . carboxymethylcellulose (REFRESH PLUS) 0.5 % SOLN Apply 1 drop to eye daily as needed (for dry eyes).   Past Week at Unknown time  . cetirizine (ZYRTEC) 10 MG tablet Take 10 mg by mouth daily.   04/29/2017 at Unknown time  . cholecalciferol (VITAMIN D) 1000 units tablet Take 1,000 Units by mouth every morning.   04/29/2017 at Unknown time  . colchicine 0.6 MG tablet Take 1 tablet (0.6 mg total) by mouth daily. 30 tablet 0 04/29/2017 at Unknown time  . diltiazem (CARDIZEM CD) 120 MG 24 hr capsule Take 1 capsule (120 mg total) by mouth daily. 90 capsule 3 04/29/2017 at Unknown time  . escitalopram (LEXAPRO) 20 MG tablet Take 1 tablet (20 mg total) by mouth daily. 30 tablet 12 04/29/2017 at Unknown time  . fluticasone furoate-vilanterol (BREO ELLIPTA) 200-25 MCG/INH AEPB Inhale 1 puff into the lungs daily.   04/29/2017 at Unknown time  . furosemide (LASIX) 20  MG tablet Take 1 tablet (20 mg total) by mouth daily. 30 tablet 0 04/29/2017 at Unknown time  . gabapentin (NEURONTIN) 100 MG capsule Take 2 capsules (200 mg total) by mouth at bedtime. 30 capsule 0 04/28/2017 at Unknown time  . levothyroxine (SYNTHROID, LEVOTHROID) 137 MCG tablet Take 137 mcg by mouth daily before breakfast.   04/29/2017 at Unknown time  . loperamide (IMODIUM) 2 MG capsule Take 1 capsule (2 mg total) by mouth every morning. (Patient taking differently: Take 2 mg by mouth every morning. *May take up to 4 times daily as needed for loose stools and diarrhea) 90 capsule 5 Past Week at Unknown time  . metoprolol tartrate (LOPRESSOR) 25 MG tablet TAKE 1/2 TABLET BY MOUTH TWICE DAILY 90 tablet 1 04/29/2017 at 1030-1100a  . omeprazole (PRILOSEC) 20 MG capsule TAKE ONE CAPSULE BY MOUTH DAILY 90 capsule 3 04/29/2017 at Unknown time  . Pediatric Multivitamins-Iron (FLINTSTONES PLUS IRON) chewable tablet Chew 1 tablet by mouth 2 (two) times daily.    04/29/2017 at Unknown time  . potassium chloride SA (K-DUR,KLOR-CON) 20 MEQ tablet Take 1 tablet (20 mEq total) by mouth daily. 30 tablet 0 04/29/2017 at Unknown time  . sodium bicarbonate 650 MG tablet Take 1 tablet (650 mg total) by mouth 2 (two) times daily. 60 tablet 0 04/29/2017 at Unknown time  . triamcinolone (NASACORT ALLERGY 24HR) 55 MCG/ACT AERO nasal inhaler Place 2 sprays into the nose daily.   unknown  . vancomycin (VANCOCIN) 50 mg/mL oral solution Take 2.5 mLs (125 mg total) by mouth 2 (two) times daily. 70 mL 0 04/28/2017 at Unknown time  . allopurinol (ZYLOPRIM) 100 MG tablet Take 150 mg by mouth daily.  0   . Amino Acids-Protein Hydrolys (FEEDING SUPPLEMENT, PRO-STAT SUGAR FREE 64,) LIQD Take 30 mLs by mouth 2 (two) times daily. (Patient not taking: Reported on 04/29/2017) 900 mL 0 Not Taking at Unknown time  . Doxepin HCl (SILENOR) 6 MG TABS Take 1 tablet (6 mg total) by mouth at bedtime as needed (for sleep). (Patient not taking: Reported on 04/29/2017) 10 tablet 0 Not Taking at Unknown time  . insulin glargine (LANTUS) 100 UNIT/ML injection Inject 0.2 mLs (20 Units total) into the skin daily. (Patient not taking: Reported on 04/29/2017) 10 mL 11 Not Taking at Unknown time  . ondansetron (ZOFRAN-ODT) 4 MG disintegrating tablet Take 1 tablet (4 mg total) by mouth 3 (three) times daily before meals. (Patient not taking: Reported on 04/29/2017) 20 tablet 0 Not Taking at Unknown time   Scheduled: . albuterol  3 mL Inhalation BID  . allopurinol  150 mg Oral Daily  . apixaban  5 mg Oral BID  . calcium carbonate  1 tablet Oral BID  . cholecalciferol  1,000 Units Oral q morning - 10a  . colchicine  0.6 mg Oral Q48H  . diltiazem  120 mg Oral Daily  . doxycycline  100 mg Oral Q12H  . escitalopram  20 mg Oral Daily  . feeding supplement  1 Container Oral TID BM  . feeding supplement (PRO-STAT SUGAR FREE 64)  30 mL Oral BID  . fluticasone  2 spray Each Nare Daily  . fluticasone furoate-vilanterol  1  puff Inhalation Daily  . furosemide  80 mg Intravenous BID  . gabapentin  200 mg Oral QHS  . insulin aspart  0-9 Units Subcutaneous TID WC  . levothyroxine  137 mcg Oral QAC breakfast  . loperamide  2 mg Oral BID AC  . metoCLOPramide  10 mg Oral TID AC & HS  . metoprolol tartrate  12.5 mg Oral BID  . multivitamin with minerals  1 tablet Oral BID  . ondansetron  8 mg Oral TID AC & HS  . pantoprazole  40 mg Oral QAC breakfast  . sodium bicarbonate  650 mg Oral BID  . sodium chloride flush  3 mL Intravenous Q12H   Continuous: . sodium chloride    . sodium chloride 125 mL/hr at 05/12/17 0241  . albumin human     [facility]:JWTGRM chloride, acetaminophen **OR** acetaminophen, acetaminophen, ondansetron (ZOFRAN) IV, polyvinyl alcohol, sodium chloride flush  Assesment: She was admitted with cellulitis developed acute on chronic heart failure and then with diuresis developed acute on chronic renal failure. Her kidney function has not improved. She has significant trouble with malnutrition but her albumin is better with albumin replacement. She has chronic nausea which is still giving her trouble. She has anemia which is about the same. Principal Problem:   Cellulitis Active Problems:   HTN (hypertension)   COPD (chronic obstructive pulmonary disease) (HCC)   PAF (paroxysmal atrial fibrillation) (HCC)   Chronic anticoagulation   Chronic edema   Obesity   Diabetes mellitus type 2 in obese (HCC)   Chronic diastolic CHF (congestive heart failure) (HCC)   Hypoalbuminemia   Anemia, chronic disease    Plan: Continue current treatments    LOS: 13 days   Curlee Bogan L 05/12/2017, 8:14 AM

## 2017-05-13 ENCOUNTER — Inpatient Hospital Stay (HOSPITAL_COMMUNITY): Payer: Medicare Other

## 2017-05-13 LAB — GLUCOSE, CAPILLARY
GLUCOSE-CAPILLARY: 103 mg/dL — AB (ref 65–99)
GLUCOSE-CAPILLARY: 109 mg/dL — AB (ref 65–99)
Glucose-Capillary: 100 mg/dL — ABNORMAL HIGH (ref 65–99)
Glucose-Capillary: 107 mg/dL — ABNORMAL HIGH (ref 65–99)
Glucose-Capillary: 93 mg/dL (ref 65–99)
Glucose-Capillary: 107 mg/dL — ABNORMAL HIGH (ref 65–99)

## 2017-05-13 LAB — RENAL FUNCTION PANEL
ALBUMIN: 3.9 g/dL (ref 3.5–5.0)
Anion gap: 12 (ref 5–15)
BUN: 19 mg/dL (ref 6–20)
CALCIUM: 7.6 mg/dL — AB (ref 8.9–10.3)
CO2: 26 mmol/L (ref 22–32)
CREATININE: 3.92 mg/dL — AB (ref 0.44–1.00)
Chloride: 87 mmol/L — ABNORMAL LOW (ref 101–111)
GFR calc Af Amer: 12 mL/min — ABNORMAL LOW (ref >60)
GFR calc non Af Amer: 10 mL/min — ABNORMAL LOW (ref >60)
GLUCOSE: 91 mg/dL (ref 65–99)
PHOSPHORUS: 4.2 mg/dL (ref 2.5–4.6)
Potassium: 4.2 mmol/L (ref 3.5–5.1)
SODIUM: 125 mmol/L — AB (ref 135–145)

## 2017-05-13 LAB — AMMONIA: AMMONIA: 52 umol/L — AB (ref 9–35)

## 2017-05-13 LAB — IRON AND TIBC: Iron: 70 ug/dL (ref 28–170)

## 2017-05-13 LAB — FERRITIN: Ferritin: 820 ng/mL — ABNORMAL HIGH (ref 11–307)

## 2017-05-13 MED ORDER — GLUCERNA SHAKE PO LIQD
237.0000 mL | Freq: Three times a day (TID) | ORAL | Status: DC
  Administered 2017-05-13: 237 mL via ORAL

## 2017-05-13 MED ORDER — FUROSEMIDE 10 MG/ML IJ SOLN
200.0000 mg | Freq: Two times a day (BID) | INTRAMUSCULAR | Status: DC
  Administered 2017-05-13: 200 mg via INTRAVENOUS
  Filled 2017-05-13 (×2): qty 20

## 2017-05-13 MED ORDER — SPIRONOLACTONE 25 MG PO TABS
50.0000 mg | ORAL_TABLET | Freq: Every day | ORAL | Status: DC
  Administered 2017-05-13: 50 mg via ORAL
  Filled 2017-05-13: qty 2

## 2017-05-13 MED ORDER — PANTOPRAZOLE SODIUM 40 MG PO TBEC
40.0000 mg | DELAYED_RELEASE_TABLET | Freq: Two times a day (BID) | ORAL | Status: DC
  Administered 2017-05-13: 40 mg via ORAL
  Filled 2017-05-13: qty 1

## 2017-05-13 MED ORDER — ALBUTEROL SULFATE (2.5 MG/3ML) 0.083% IN NEBU
2.5000 mg | INHALATION_SOLUTION | RESPIRATORY_TRACT | Status: DC
  Administered 2017-05-13 – 2017-05-14 (×6): 2.5 mg via RESPIRATORY_TRACT
  Filled 2017-05-13 (×6): qty 3

## 2017-05-13 NOTE — Progress Notes (Signed)
MD on call notified about PT's +4 pitting edema preventing IV initiation. MD states PICC line will be ordered and placed 05/14/17 a.m. PT unable to swallow oral medications, MD made aware. PO fluids provided to patient. Needs met and RN will continue to monitor throughout shift.

## 2017-05-13 NOTE — Progress Notes (Signed)
Pt feeling sad at this time, has called sisters to bedside to discuss giving up, states "I'm ready to go to heaven". Sisters talking with pt. Nurse Sherrine Maples offered any assistance and sympathy to pt and family. Will continue to monitor.

## 2017-05-13 NOTE — Progress Notes (Addendum)
Subjective: Interval History: Patient is still with poor appetite. He denies any difficulty breathing.   Objective: Vital signs in last 24 hours: Temp:  [98.1 F (36.7 C)-98.4 F (36.9 C)] 98.1 F (36.7 C) (08/19 2138) Pulse Rate:  [72-93] 93 (08/20 0555) Resp:  [16-18] 18 (08/20 0555) BP: (111-138)/(64) 138/64 (08/20 0555) SpO2:  [90 %-95 %] 95 % (08/20 0744) Weight change:   Intake/Output from previous day: 08/19 0701 - 08/20 0700 In: 2975.8 [P.O.:360; I.V.:2353.8; IV Piggyback:262] Out: 150 [Urine:150] Intake/Output this shift: No intake/output data recorded.  Generally patient is alert and in no apparent distress Chest: Decreased breath sound bilaterally Heart exam regular rate and rhythm no murmur Extremities she has 2+ edema  Lab Results:  Recent Labs  05/11/17 0659 05/12/17 0546  WBC 6.6 10.3  HGB 7.8* 7.8*  HCT 23.3* 23.4*  PLT 184 205   BMET:   Recent Labs  05/12/17 0546 05/13/17 0541  NA 125* 125*  K 3.8 4.2  CL 87* 87*  CO2 27 26  GLUCOSE 81 91  BUN 19 19  CREATININE 3.47* 3.92*  CALCIUM 7.7* 7.6*   No results for input(s): PTH in the last 72 hours. Iron Studies: No results for input(s): IRON, TIBC, TRANSFERRIN, FERRITIN in the last 72 hours.  Studies/Results: No results found.  I have reviewed the patient's current medications.  Assessment/Plan: Problem #1 acute kidney injury superimposed on chronic. Presently her BUN and creatinine Continue to increase. Her urine output is also poor.. Etiology was thought to be secondary to prerenal syndrome/ATN/possible contrast induced acute kidney injury. She has some nausea and poor appetite but claims it has been seen for some time on and off. Problem #2 chronic renal failure stage III Problem #3 anemia: Her hemoglobin is below target goal. Etiology not clear . Iron studies pending.  Problem #4 hypertension: Her blood pressure is reasonably controlled Problem #5 history of cellulitis Problem #6  history of COPD Problem #7 bone and mineral disorder: Her calcium and phosphorus is range Problem #8 hyponatremia: Hypervolemic hyponatremia. Her sodium is low but stable.  Problem #9 history of CHF: She is on Lasix. Her urine output is poor and patient has some edema but denies any difficulty breathing. Plan: We'll DC IV fluid 2] we'll increase Lasix to to 200 mg IV twice a day 3] we'll add Aldactone 50 mg once a day 4] we'll check her renal panel and CBC in the morning.    LOS: 14 days   Jeannett Dekoning S 05/13/2017,8:38 AM

## 2017-05-13 NOTE — Progress Notes (Signed)
Subjective: She is definitely worse this morning. Less responsive. I think this is likely from her renal dysfunction plus depression. She still has some swelling. She told her family last night that she was ready to go to heaven. This morning she says that she's on to continue trying to get better  Objective: Vital signs in last 24 hours: Temp:  [98.1 F (36.7 C)-98.4 F (36.9 C)] 98.1 F (36.7 C) (08/19 2138) Pulse Rate:  [72-93] 93 (08/20 0555) Resp:  [16-18] 18 (08/20 0555) BP: (111-138)/(64) 138/64 (08/20 0555) SpO2:  [90 %-95 %] 95 % (08/20 0744) Weight change:  Last BM Date: 05/10/17  Intake/Output from previous day: 08/19 0701 - 08/20 0700 In: 2975.8 [P.O.:360; I.V.:2353.8; IV Piggyback:262] Out: 150 [Urine:150]  PHYSICAL EXAM General appearance: alert and Much more sluggish this morning Resp: alert and Chest is clear Cardio: regular rate and rhythm, S1, S2 normal, no murmur, click, rub or gallop GI: soft, non-tender; bowel sounds normal; no masses,  no organomegaly Extremities: Significant third spacing of fluid Skin lesions better  Lab Results:  Results for orders placed or performed during the hospital encounter of 04/29/17 (from the past 48 hour(s))  Glucose, capillary     Status: Abnormal   Collection Time: 05/11/17  4:40 PM  Result Value Ref Range   Glucose-Capillary 106 (H) 65 - 99 mg/dL   Comment 1 Notify RN    Comment 2 Document in Chart   Glucose, capillary     Status: None   Collection Time: 05/11/17  9:16 PM  Result Value Ref Range   Glucose-Capillary 95 65 - 99 mg/dL   Comment 1 Notify RN    Comment 2 Document in Chart   Renal function panel     Status: Abnormal   Collection Time: 05/12/17  5:46 AM  Result Value Ref Range   Sodium 125 (L) 135 - 145 mmol/L   Potassium 3.8 3.5 - 5.1 mmol/L   Chloride 87 (L) 101 - 111 mmol/L   CO2 27 22 - 32 mmol/L   Glucose, Bld 81 65 - 99 mg/dL   BUN 19 6 - 20 mg/dL   Creatinine, Ser 3.47 (H) 0.44 - 1.00 mg/dL    Calcium 7.7 (L) 8.9 - 10.3 mg/dL   Phosphorus 4.2 2.5 - 4.6 mg/dL   Albumin 3.5 3.5 - 5.0 g/dL   GFR calc non Af Amer 12 (L) >60 mL/min   GFR calc Af Amer 14 (L) >60 mL/min    Comment: (NOTE) The eGFR has been calculated using the CKD EPI equation. This calculation has not been validated in all clinical situations. eGFR's persistently <60 mL/min signify possible Chronic Kidney Disease.    Anion gap 11 5 - 15  CBC with Differential/Platelet     Status: Abnormal   Collection Time: 05/12/17  5:46 AM  Result Value Ref Range   WBC 10.3 4.0 - 10.5 K/uL   RBC 2.44 (L) 3.87 - 5.11 MIL/uL   Hemoglobin 7.8 (L) 12.0 - 15.0 g/dL   HCT 23.4 (L) 36.0 - 46.0 %   MCV 95.9 78.0 - 100.0 fL   MCH 32.0 26.0 - 34.0 pg   MCHC 33.3 30.0 - 36.0 g/dL   RDW 16.4 (H) 11.5 - 15.5 %   Platelets 205 150 - 400 K/uL   Neutrophils Relative % 75 %   Neutro Abs 7.7 1.7 - 7.7 K/uL   Lymphocytes Relative 14 %   Lymphs Abs 1.5 0.7 - 4.0 K/uL   Monocytes Relative  8 %   Monocytes Absolute 0.8 0.1 - 1.0 K/uL   Eosinophils Relative 3 %   Eosinophils Absolute 0.3 0.0 - 0.7 K/uL   Basophils Relative 0 %   Basophils Absolute 0.0 0.0 - 0.1 K/uL  Glucose, capillary     Status: None   Collection Time: 05/12/17  7:28 AM  Result Value Ref Range   Glucose-Capillary 88 65 - 99 mg/dL   Comment 1 Notify RN    Comment 2 Document in Chart   Glucose, capillary     Status: Abnormal   Collection Time: 05/12/17 11:10 AM  Result Value Ref Range   Glucose-Capillary 101 (H) 65 - 99 mg/dL   Comment 1 Notify RN    Comment 2 Document in Chart   Glucose, capillary     Status: None   Collection Time: 05/12/17  4:33 PM  Result Value Ref Range   Glucose-Capillary 99 65 - 99 mg/dL   Comment 1 Notify RN    Comment 2 Document in Chart   Glucose, capillary     Status: Abnormal   Collection Time: 05/12/17  8:51 PM  Result Value Ref Range   Glucose-Capillary 102 (H) 65 - 99 mg/dL   Comment 1 Notify RN    Comment 2 Document in Chart    Renal function panel     Status: Abnormal   Collection Time: 05/13/17  5:41 AM  Result Value Ref Range   Sodium 125 (L) 135 - 145 mmol/L   Potassium 4.2 3.5 - 5.1 mmol/L   Chloride 87 (L) 101 - 111 mmol/L   CO2 26 22 - 32 mmol/L   Glucose, Bld 91 65 - 99 mg/dL   BUN 19 6 - 20 mg/dL   Creatinine, Ser 3.92 (H) 0.44 - 1.00 mg/dL   Calcium 7.6 (L) 8.9 - 10.3 mg/dL   Phosphorus 4.2 2.5 - 4.6 mg/dL   Albumin 3.9 3.5 - 5.0 g/dL   GFR calc non Af Amer 10 (L) >60 mL/min   GFR calc Af Amer 12 (L) >60 mL/min    Comment: (NOTE) The eGFR has been calculated using the CKD EPI equation. This calculation has not been validated in all clinical situations. eGFR's persistently <60 mL/min signify possible Chronic Kidney Disease.    Anion gap 12 5 - 15  Glucose, capillary     Status: None   Collection Time: 05/13/17  7:31 AM  Result Value Ref Range   Glucose-Capillary 93 65 - 99 mg/dL    ABGS No results for input(s): PHART, PO2ART, TCO2, HCO3 in the last 72 hours.  Invalid input(s): PCO2 CULTURES Recent Results (from the past 240 hour(s))  C difficile quick scan w PCR reflex     Status: None   Collection Time: 05/07/17 10:00 AM  Result Value Ref Range Status   C Diff antigen NEGATIVE NEGATIVE Final   C Diff toxin NEGATIVE NEGATIVE Final   C Diff interpretation No C. difficile detected.  Final  Gastrointestinal Panel by PCR , Stool     Status: None   Collection Time: 05/08/17 10:51 AM  Result Value Ref Range Status   Campylobacter species NOT DETECTED NOT DETECTED Final   Plesimonas shigelloides NOT DETECTED NOT DETECTED Final   Salmonella species NOT DETECTED NOT DETECTED Final   Yersinia enterocolitica NOT DETECTED NOT DETECTED Final   Vibrio species NOT DETECTED NOT DETECTED Final   Vibrio cholerae NOT DETECTED NOT DETECTED Final   Enteroaggregative E coli (EAEC) NOT DETECTED NOT DETECTED Final  Enteropathogenic E coli (EPEC) NOT DETECTED NOT DETECTED Final   Enterotoxigenic E  coli (ETEC) NOT DETECTED NOT DETECTED Final   Shiga like toxin producing E coli (STEC) NOT DETECTED NOT DETECTED Final   Shigella/Enteroinvasive E coli (EIEC) NOT DETECTED NOT DETECTED Final   Cryptosporidium NOT DETECTED NOT DETECTED Final   Cyclospora cayetanensis NOT DETECTED NOT DETECTED Final   Entamoeba histolytica NOT DETECTED NOT DETECTED Final   Giardia lamblia NOT DETECTED NOT DETECTED Final   Adenovirus F40/41 NOT DETECTED NOT DETECTED Final   Astrovirus NOT DETECTED NOT DETECTED Final   Norovirus GI/GII NOT DETECTED NOT DETECTED Final   Rotavirus A NOT DETECTED NOT DETECTED Final   Sapovirus (I, II, IV, and V) NOT DETECTED NOT DETECTED Final   Studies/Results: No results found.  Medications:  Prior to Admission:  Prescriptions Prior to Admission  Medication Sig Dispense Refill Last Dose  . acetaminophen (TYLENOL) 325 MG tablet Take 2 tablets (650 mg total) by mouth every 6 (six) hours as needed for mild pain (or Fever >/= 101).   Past Week at Unknown time  . albuterol (PROVENTIL HFA;VENTOLIN HFA) 108 (90 Base) MCG/ACT inhaler Inhale 2 puffs into the lungs 4 (four) times daily.    04/29/2017 at Unknown time  . apixaban (ELIQUIS) 5 MG TABS tablet Take 1 tablet (5 mg total) by mouth 2 (two) times daily. 60 tablet 5 04/29/2017 at 1030-1100a  . calcium carbonate (TUMS - DOSED IN MG ELEMENTAL CALCIUM) 500 MG chewable tablet Chew 1 tablet by mouth 2 (two) times daily.   04/29/2017 at Unknown time  . carboxymethylcellulose (REFRESH PLUS) 0.5 % SOLN Apply 1 drop to eye daily as needed (for dry eyes).   Past Week at Unknown time  . cetirizine (ZYRTEC) 10 MG tablet Take 10 mg by mouth daily.   04/29/2017 at Unknown time  . cholecalciferol (VITAMIN D) 1000 units tablet Take 1,000 Units by mouth every morning.   04/29/2017 at Unknown time  . colchicine 0.6 MG tablet Take 1 tablet (0.6 mg total) by mouth daily. 30 tablet 0 04/29/2017 at Unknown time  . diltiazem (CARDIZEM CD) 120 MG 24 hr capsule Take  1 capsule (120 mg total) by mouth daily. 90 capsule 3 04/29/2017 at Unknown time  . escitalopram (LEXAPRO) 20 MG tablet Take 1 tablet (20 mg total) by mouth daily. 30 tablet 12 04/29/2017 at Unknown time  . fluticasone furoate-vilanterol (BREO ELLIPTA) 200-25 MCG/INH AEPB Inhale 1 puff into the lungs daily.   04/29/2017 at Unknown time  . furosemide (LASIX) 20 MG tablet Take 1 tablet (20 mg total) by mouth daily. 30 tablet 0 04/29/2017 at Unknown time  . gabapentin (NEURONTIN) 100 MG capsule Take 2 capsules (200 mg total) by mouth at bedtime. 30 capsule 0 04/28/2017 at Unknown time  . levothyroxine (SYNTHROID, LEVOTHROID) 137 MCG tablet Take 137 mcg by mouth daily before breakfast.   04/29/2017 at Unknown time  . loperamide (IMODIUM) 2 MG capsule Take 1 capsule (2 mg total) by mouth every morning. (Patient taking differently: Take 2 mg by mouth every morning. *May take up to 4 times daily as needed for loose stools and diarrhea) 90 capsule 5 Past Week at Unknown time  . metoprolol tartrate (LOPRESSOR) 25 MG tablet TAKE 1/2 TABLET BY MOUTH TWICE DAILY 90 tablet 1 04/29/2017 at 1030-1100a  . omeprazole (PRILOSEC) 20 MG capsule TAKE ONE CAPSULE BY MOUTH DAILY 90 capsule 3 04/29/2017 at Unknown time  . Pediatric Multivitamins-Iron (FLINTSTONES PLUS IRON) chewable tablet  Chew 1 tablet by mouth 2 (two) times daily.   04/29/2017 at Unknown time  . potassium chloride SA (K-DUR,KLOR-CON) 20 MEQ tablet Take 1 tablet (20 mEq total) by mouth daily. 30 tablet 0 04/29/2017 at Unknown time  . sodium bicarbonate 650 MG tablet Take 1 tablet (650 mg total) by mouth 2 (two) times daily. 60 tablet 0 04/29/2017 at Unknown time  . triamcinolone (NASACORT ALLERGY 24HR) 55 MCG/ACT AERO nasal inhaler Place 2 sprays into the nose daily.   unknown  . vancomycin (VANCOCIN) 50 mg/mL oral solution Take 2.5 mLs (125 mg total) by mouth 2 (two) times daily. 70 mL 0 04/28/2017 at Unknown time  . allopurinol (ZYLOPRIM) 100 MG tablet Take 150 mg by mouth daily.   0   . Amino Acids-Protein Hydrolys (FEEDING SUPPLEMENT, PRO-STAT SUGAR FREE 64,) LIQD Take 30 mLs by mouth 2 (two) times daily. (Patient not taking: Reported on 04/29/2017) 900 mL 0 Not Taking at Unknown time  . Doxepin HCl (SILENOR) 6 MG TABS Take 1 tablet (6 mg total) by mouth at bedtime as needed (for sleep). (Patient not taking: Reported on 04/29/2017) 10 tablet 0 Not Taking at Unknown time  . insulin glargine (LANTUS) 100 UNIT/ML injection Inject 0.2 mLs (20 Units total) into the skin daily. (Patient not taking: Reported on 04/29/2017) 10 mL 11 Not Taking at Unknown time  . ondansetron (ZOFRAN-ODT) 4 MG disintegrating tablet Take 1 tablet (4 mg total) by mouth 3 (three) times daily before meals. (Patient not taking: Reported on 04/29/2017) 20 tablet 0 Not Taking at Unknown time   Scheduled: . albuterol  3 mL Inhalation BID  . allopurinol  150 mg Oral Daily  . apixaban  5 mg Oral BID  . calcium carbonate  1 tablet Oral BID  . cholecalciferol  1,000 Units Oral q morning - 10a  . colchicine  0.6 mg Oral Q48H  . diltiazem  120 mg Oral Daily  . escitalopram  20 mg Oral Daily  . feeding supplement (GLUCERNA SHAKE)  237 mL Oral TID BM  . feeding supplement (PRO-STAT SUGAR FREE 64)  30 mL Oral BID  . fluticasone  2 spray Each Nare Daily  . fluticasone furoate-vilanterol  1 puff Inhalation Daily  . gabapentin  200 mg Oral QHS  . insulin aspart  0-9 Units Subcutaneous TID WC  . levothyroxine  137 mcg Oral QAC breakfast  . loperamide  2 mg Oral BID AC  . metoCLOPramide  10 mg Oral TID AC & HS  . metoprolol tartrate  12.5 mg Oral BID  . multivitamin with minerals  1 tablet Oral BID  . ondansetron  8 mg Oral TID AC & HS  . pantoprazole  40 mg Oral QAC breakfast  . sodium bicarbonate  650 mg Oral BID  . sodium chloride flush  3 mL Intravenous Q12H  . spironolactone  50 mg Oral Daily   Continuous: . sodium chloride    . albumin human    . furosemide     DPO:EUMPNT chloride, acetaminophen **OR**  acetaminophen, acetaminophen, ondansetron (ZOFRAN) IV, polyvinyl alcohol, sodium chloride flush  Assesment: She was admitted with cellulitis of the abdominal wall. She has severe malnutrition at baseline. Her albumin level is low. She has leaking of fluid into the third space. She does have chronic diastolic heart failure. She has now developed acute on chronic kidney injury and her renal function continues to deteriorate. Principal Problem:   Cellulitis Active Problems:   HTN (hypertension)   COPD (  chronic obstructive pulmonary disease) (HCC)   PAF (paroxysmal atrial fibrillation) (HCC)   Chronic anticoagulation   Chronic edema   Obesity   Diabetes mellitus type 2 in obese (HCC)   Chronic diastolic CHF (congestive heart failure) (HCC)   Hypoalbuminemia   Anemia, chronic disease   Nausea without vomiting    Plan: Discontinue doxycycline. Try nutritional supplements. Continue with other treatments.    LOS: 14 days   Catrinia Racicot L 05/13/2017, 9:01 AM

## 2017-05-13 NOTE — Progress Notes (Signed)
    Subjective: Tries to take a bite and just "heaves". Coke is tolerated. Getting weaker every day. Sallye Ober, sister at bedside, providing information. Sister requesting psychiatric consult. She called her sisters last night and stated she was "ready to go to heaven".   Objective: Vital signs in last 24 hours: Temp:  [98.1 F (36.7 C)-98.4 F (36.9 C)] 98.1 F (36.7 C) (08/19 2138) Pulse Rate:  [72-93] 93 (08/20 0555) Resp:  [16-18] 18 (08/20 0555) BP: (111-138)/(64) 138/64 (08/20 0555) SpO2:  [90 %-97 %] 93 % (08/20 0555) Last BM Date: 05/10/17 General:   Resting with eyes closed, drowsy but awakens.  Head:  Normocephalic and atraumatic. Abdomen:  Bowel sounds present, soft, obese, non-tender.  Extremities:  With significant pedal edema, anasarca. Neurologic:  Oriented X 4    Intake/Output from previous day: 08/19 0701 - 08/20 0700 In: 2975.8 [P.O.:360; I.V.:2353.8; IV Piggyback:262] Out: 150 [Urine:150] Intake/Output this shift: Total I/O In: 995 [I.V.:895; IV Piggyback:100] Out: -   Lab Results:  Recent Labs  05/11/17 0659 05/12/17 0546  WBC 6.6 10.3  HGB 7.8* 7.8*  HCT 23.3* 23.4*  PLT 184 205   BMET  Recent Labs  05/11/17 0659 05/12/17 0546  NA 126* 125*  K 3.9 3.8  CL 85* 87*  CO2 29 27  GLUCOSE 79 81  BUN 19 19  CREATININE 3.40* 3.47*  CALCIUM 7.8* 7.7*   LFT  Recent Labs  05/11/17 0659 05/12/17 0546  PROT 5.4*  --   ALBUMIN 3.2*  3.0* 3.5  AST 39  --   ALT 21  --   ALKPHOS 122  --   BILITOT 2.4*  --   BILIDIR 1.3*  --   IBILI 1.1*  --     Assessment: 76 year old female with multiple co-morbidities, diarrhea that has resolved, postprandial nausea and vomiting that is persistent. Sister at bedside stating that with just one bite, patient is heaving; however, she tolerates coke without difficulty. Minimal intake by mouth. Depression noted by sister, who is requesting a psychiatric consult. Offered chaplain services but declined stating  their personal minister has been to see her. At this point, failure to thrive is multifactorial with depression likely playing a role.   Anemia: without overt GI bleeding. Iron studies pending. EGD on file May 2018.     Plan: Continue scheduled Zofran Continue scheduled Reglan Continue supplement (Glucerna and prostat) Increase Protonix to BID Family requesting psychiatric evaluation: will defer to attending.    Gelene Mink, PhD, ANP-BC [facility]    LOS: 14 days    05/13/2017, 6:35 AM

## 2017-05-13 NOTE — Progress Notes (Signed)
Lasix infusion was stopped early due to loss of IV access. IV was discovered to be infiltrated shortly after 1900. Area of insertion was noted to be red and weeping, the IV was then removed. The patient tolerated the procedure well. Three attempts were made by first shift nurse to re-obtain IV access, these attempts were unsuccessful.

## 2017-05-14 LAB — CBC WITH DIFFERENTIAL/PLATELET
BASOS ABS: 0 10*3/uL (ref 0.0–0.1)
Basophils Relative: 0 %
EOS ABS: 0 10*3/uL (ref 0.0–0.7)
Eosinophils Relative: 0 %
HCT: 21.8 % — ABNORMAL LOW (ref 36.0–46.0)
HEMOGLOBIN: 7.3 g/dL — AB (ref 12.0–15.0)
LYMPHS ABS: 0.8 10*3/uL (ref 0.7–4.0)
Lymphocytes Relative: 6 %
MCH: 31.7 pg (ref 26.0–34.0)
MCHC: 33.5 g/dL (ref 30.0–36.0)
MCV: 94.8 fL (ref 78.0–100.0)
MONOS PCT: 7 %
Monocytes Absolute: 1 10*3/uL (ref 0.1–1.0)
NEUTROS PCT: 87 %
Neutro Abs: 13 10*3/uL — ABNORMAL HIGH (ref 1.7–7.7)
Platelets: 157 10*3/uL (ref 150–400)
RBC: 2.3 MIL/uL — AB (ref 3.87–5.11)
RDW: 16.8 % — ABNORMAL HIGH (ref 11.5–15.5)
WBC: 14.8 10*3/uL — ABNORMAL HIGH (ref 4.0–10.5)

## 2017-05-14 LAB — RENAL FUNCTION PANEL
Albumin: 3.2 g/dL — ABNORMAL LOW (ref 3.5–5.0)
Anion gap: 11 (ref 5–15)
BUN: 22 mg/dL — AB (ref 6–20)
CHLORIDE: 87 mmol/L — AB (ref 101–111)
CO2: 25 mmol/L (ref 22–32)
CREATININE: 4.96 mg/dL — AB (ref 0.44–1.00)
Calcium: 7.3 mg/dL — ABNORMAL LOW (ref 8.9–10.3)
GFR calc Af Amer: 9 mL/min — ABNORMAL LOW (ref >60)
GFR calc non Af Amer: 8 mL/min — ABNORMAL LOW (ref >60)
GLUCOSE: 98 mg/dL (ref 65–99)
Phosphorus: 4.2 mg/dL (ref 2.5–4.6)
Potassium: 3.8 mmol/L (ref 3.5–5.1)
SODIUM: 123 mmol/L — AB (ref 135–145)

## 2017-05-14 LAB — BLOOD GAS, ARTERIAL
Acid-Base Excess: 0.5 mmol/L (ref 0.0–2.0)
BICARBONATE: 23.3 mmol/L (ref 20.0–28.0)
Drawn by: 317771
O2 CONTENT: 3 L/min
O2 Saturation: 71 %
PH ART: 7.283 — AB (ref 7.350–7.450)
pCO2 arterial: 55.4 mmHg — ABNORMAL HIGH (ref 32.0–48.0)
pO2, Arterial: 42 mmHg — ABNORMAL LOW (ref 83.0–108.0)

## 2017-05-14 LAB — GLUCOSE, CAPILLARY
GLUCOSE-CAPILLARY: 104 mg/dL — AB (ref 65–99)
GLUCOSE-CAPILLARY: 100 mg/dL — AB (ref 65–99)

## 2017-05-14 MED ORDER — MORPHINE SULFATE (CONCENTRATE) 10 MG/0.5ML PO SOLN: 5.0000 mg | ORAL | Status: DC | PRN

## 2017-05-14 MED ORDER — DILTIAZEM HCL 30 MG PO TABS: 30.0000 mg | ORAL_TABLET | Freq: Four times a day (QID) | ORAL | Status: DC

## 2017-05-14 NOTE — Care Management Important Message (Signed)
Important Message  Patient Details  Name: Yolanda [name] MRN: [id number] Date of Birth: 1940/11/14   Medicare Important Message Given:  Yes    Malcolm Metro, RN 05/14/2017, 11:20 AM

## 2017-05-14 NOTE — Progress Notes (Signed)
She lost her IV last night. Her family has been concerned about her long-term prognosis and I requested a meeting with the family. I first talked to the patient. She does not want dialysis. She does not want a feeding tube. She has now gotten to the point that she can't swallow her medications. She is more swollen. I told her that I don't think she can get over all of her problems. She requests hospice care. I think that's appropriate. Discussed that at length with the patient and her family. The patient says she is tired and doesn't want to keep fighting. Will arrange hospice care I think at the inpatient facility if a bed is available

## 2017-05-14 NOTE — Care Management (Addendum)
PATIENT ALLERGIES: Ciprofloxacin Shortness Of Breath High   Past Updates...  REACTION: sob,tachycardia    Fish Oil  High  11/13/2011 Past Updates...  Patient face drew to the side,Bells [place]y    Guaifenesin Shortness Of Breath High   Past Updates...  REACTION: sob,tachycardia    Mucinex [Guaifenesin Er] Shortness Of Breath High  12/27/2016 Past Updates...  Sulfamethoxazole W/trimethoprim 800-160 [Sulfamethoxazole-trimethoprim] Nausea Only High  12/15/2014 Past Updates...  Also lack of appetite     Uloric [Febuxostat] Swelling High Allergy 01/26/2016 Past Updates...  No urination    Adhesive [Tape] Other (See Comments) Not Specified  11/06/2014 Past Updates...  Causes blisters on skin    Cefuroxime Axetil Swelling Not Specified Allergy  Past Updates...  Swelling all over body-per patient she was hospitalized as a result of taking this medication    Metolazone Nausea Only Not Specified  11/07/2016 Past Updates...  Swelling     Adverse Reactions/Drug Intolerances  Tramadol  Not Specified Intolerance 09/03/2013 Past Updates...  insomnia      Mark as Reviewed

## 2017-05-14 NOTE — Clinical Social Work Note (Signed)
Patient is going to [facility]. Clinicals sent. Patient accepted. LCSW signing off.       Diani Jillson, Juleen China, LCSW

## 2017-05-14 NOTE — Progress Notes (Signed)
Called report to Hansel Starling, RN of [facility] on 05/14/2017 at 1315.

## 2017-05-14 NOTE — Care Management Note (Signed)
Case Management Note  Patient Details  Name: Yolanda [name] MRN: [id number] Date of Birth: [birth year]  Expected Discharge Date:     05/14/2017             Expected Discharge Plan:  Hospice Medical Facility  In-House Referral:  Clinical Social Work  Discharge planning Services  CM Consult  Post Acute Care Choice:  Durable Medical Equipment Choice offered to:  Patient  DME Arranged:  Dan Humphreys rolling DME Agency:  [facility]  Status of Service:  Completed, signed off  If discussed at Long Length of Stay Meetings, dates discussed:  05/14/2017  Additional Comments: Pt being transitioned to comfort care. Request transfer to hospice facility. CSW aware and will make arrangements for transfer to hospice of [facility] facility as soon as bed available. Tim, [facility] rep, make aware of DC plan.   Malcolm Metro, RN 05/14/2017, 11:21 AM

## 2017-05-14 NOTE — Discharge Summary (Signed)
Physician Discharge Summary  Patient ID: Yolanda [name] MRN: [id number] DOB/AGE: 12/19/1940 76 y.o. Primary Care Physician:Ocia Simek, Ramon Dredge, MD Admit date: 04/29/2017 Discharge date: 05/14/2017    Discharge Diagnoses:   Principal Problem:   Cellulitis Active Problems:   HTN (hypertension)   COPD (chronic obstructive pulmonary disease) (HCC)   PAF (paroxysmal atrial fibrillation) (HCC)   Chronic anticoagulation   Chronic edema   Obesity   Diabetes mellitus type 2 in obese (HCC)   Chronic diastolic CHF (congestive heart failure) (HCC)   Hypoalbuminemia   Anemia, chronic disease   Nausea without vomiting Acute on chronic renal failure Acute on chronic diastolic heart failure Severe malnutrition Depression Anxiety    Discharged Condition:Terminal    Consults: GI/nephrology  Significant Diagnostic Studies: Ct Abdomen Pelvis W Contrast  Result Date: 04/29/2017 CLINICAL DATA:  Erythema, pain and swelling of right lower abdomen. EXAM: CT ABDOMEN AND PELVIS WITH CONTRAST TECHNIQUE: Multidetector CT imaging of the abdomen and pelvis was performed using the standard protocol following bolus administration of intravenous contrast. CONTRAST:  ISOVUE-300 IOPAMIDOL (ISOVUE-300) INJECTION 61% COMPARISON:  01/28/2017 FINDINGS: Lower chest: New small bilateral pleural effusions with associated bibasilar atelectasis. Hepatobiliary: Marked progressive steatosis of the liver with likely progression to more overt cirrhosis. No focal hepatic mass lesions or evidence of biliary ductal dilatation. No evidence of portal vein thrombus. The gallbladder has been removed. Pancreas: Stable atrophic pancreas without evidence of lesion or pancreatitis. Spleen: Spleen size is normal. Splenic parenchyma shows normal enhancement. The splenic vein is open. Adrenals/Urinary Tract: Adrenal glands are unremarkable. Kidneys are normal, without renal calculi, focal lesion, or hydronephrosis. Bladder is  unremarkable. Stomach/Bowel: No evidence of bowel obstruction or free air. Stable submucosal fat infiltration in the cecum and ascending colon can be associated with chronic inflammation. No acute inflammatory changes are seen. No evidence of abscess. Vascular/Lymphatic: No enlarged lymph nodes are seen. The abdominal aorta shows calcified plaque without evidence of aneurysm. Reproductive: Status post hysterectomy. No adnexal masses. Other: There is a small amount of ascites in the pelvis. The abdominal wall shows diffuse edema and anasarca suggestive of malnutrition and hypoproteinemia. No hernias identified. Musculoskeletal: No acute or significant osseous findings. IMPRESSION: 1. Marked progressive steatosis of the liver with progression to more overt cirrhosis. Associated small amount of ascites in the pelvis. There also are small bilateral pleural effusions. 2. Stable submucosal fat infiltration of the cecum and ascending colon. This can be associated with chronic inflammatory conditions. No acute inflammation or obstruction identified. 3. Diffuse body wall edema and anasarca is suggestive of malnutrition and hypoproteinemia. Electronically Signed   By: Irish Lack M.D.   On: 04/29/2017 21:44   Dg Chest Port 1 View  Result Date: 05/13/2017 CLINICAL DATA:  Productive cough, shortness of breath. EXAM: PORTABLE CHEST 1 VIEW COMPARISON:  Radiograph of April 08, 2017. FINDINGS: Stable cardiomegaly with mild central pulmonary vascular congestion. No pneumothorax is noted. Mild bibasilar atelectasis or edema is noted with minimal associated pleural effusions. Bony thorax is unremarkable. IMPRESSION: Stable cardiomegaly with mild central pulmonary vascular congestion. Mild bibasilar atelectasis or edema is noted with minimal associated pleural effusions. Electronically Signed   By: Lupita Raider, M.D.   On: 05/13/2017 19:03    Lab Results: Basic Metabolic Panel:  Recent Labs  40/98/11 0541  05/14/17 0749  NA 125* 123*  K 4.2 3.8  CL 87* 87*  CO2 26 25  GLUCOSE 91 98  BUN 19 22*  CREATININE 3.92* 4.96*  CALCIUM 7.6*  7.3*  PHOS 4.2 4.2   Liver Function Tests:  Recent Labs  05/13/17 0541 05/14/17 0749  ALBUMIN 3.9 3.2*     CBC:  Recent Labs  05/12/17 0546 05/14/17 0749  WBC 10.3 14.8*  NEUTROABS 7.7 13.0*  HGB 7.8* 7.3*  HCT 23.4* 21.8*  MCV 95.9 94.8  PLT 205 157    Recent Results (from the past 240 hour(s))  C difficile quick scan w PCR reflex     Status: None   Collection Time: 05/07/17 10:00 AM  Result Value Ref Range Status   C Diff antigen NEGATIVE NEGATIVE Final   C Diff toxin NEGATIVE NEGATIVE Final   C Diff interpretation No C. difficile detected.  Final  Gastrointestinal Panel by PCR , Stool     Status: None   Collection Time: 05/08/17 10:51 AM  Result Value Ref Range Status   Campylobacter species NOT DETECTED NOT DETECTED Final   Plesimonas shigelloides NOT DETECTED NOT DETECTED Final   Salmonella species NOT DETECTED NOT DETECTED Final   Yersinia enterocolitica NOT DETECTED NOT DETECTED Final   Vibrio species NOT DETECTED NOT DETECTED Final   Vibrio cholerae NOT DETECTED NOT DETECTED Final   Enteroaggregative E coli (EAEC) NOT DETECTED NOT DETECTED Final   Enteropathogenic E coli (EPEC) NOT DETECTED NOT DETECTED Final   Enterotoxigenic E coli (ETEC) NOT DETECTED NOT DETECTED Final   Shiga like toxin producing E coli (STEC) NOT DETECTED NOT DETECTED Final   Shigella/Enteroinvasive E coli (EIEC) NOT DETECTED NOT DETECTED Final   Cryptosporidium NOT DETECTED NOT DETECTED Final   Cyclospora cayetanensis NOT DETECTED NOT DETECTED Final   Entamoeba histolytica NOT DETECTED NOT DETECTED Final   Giardia lamblia NOT DETECTED NOT DETECTED Final   Adenovirus F40/41 NOT DETECTED NOT DETECTED Final   Astrovirus NOT DETECTED NOT DETECTED Final   Norovirus GI/GII NOT DETECTED NOT DETECTED Final   Rotavirus A NOT DETECTED NOT DETECTED Final    Sapovirus (I, II, IV, and V) NOT DETECTED NOT DETECTED Final     Hospital Course: This is a 76 year old who has had a prolonged illness for the last 4 months or so. She's been in and out of the hospital and in and out of nursing home. She has failure to thrive. She came in because of cellulitis of her leg. She had started on antibiotics and improved then developed severe nausea. She also had increased swelling. She was started on diuresis because of acute on chronic heart failure. She then developed acute on chronic renal failure which is been an issue in the past. She has severe malnutrition. She had consultation with GI because of chronic nausea and with nephrology because of the acute on chronic renal failure. She deteriorated about 36 hours prior to this dictation with increasing problems with swallowing to the point that she could not swallow most of her medications. She was not able to take in food. She said that she was ready to die. Discussion was undertaken with the patient and she does not want dialysis does not want feeding tube and says she is tired and doesn't want to fight anymore. Discussed with her family and they understand and request hospice care. I think she is appropriate for hospice inpatient facility because I don't think she will live more than 2 weeks once we stop treatments.  Discharge Exam: Blood pressure 120/61, pulse 68, temperature 98.2 F (36.8 C), temperature source Oral, resp. rate 18, height 5\' 1"  (1.549 m), weight 89.4 kg (197 lb 1.8  oz), SpO2 97 %. She is awake and alert. She is able to discuss problems with me. She understands. She still has some cellulitis of the abdominal wall. She has significant third spacing of fluid diffusely.  Disposition: To hospice inpatient facility. I did not order medications at discharge as hospice will provide symptom management    Follow-up Information    Home[facility] Follow up.   Specialty:  [facility] Contact  information: [address] [place] [address] [phone]           Signed: Fredirick Maudlin   05/14/2017, 8:48 AM

## 2017-05-14 NOTE — Progress Notes (Signed)
Subjective: Interval History: Patient is still with poor appetite. She has nausea but no vomiting. Complains of weakness  Objective: Vital signs in last 24 hours: Temp:  [98.2 F (36.8 C)-98.4 F (36.9 C)] 98.2 F (36.8 C) (08/21 0524) Pulse Rate:  [68-80] 68 (08/21 0524) Resp:  [18] 18 (08/21 0524) BP: (84-120)/(47-61) 120/61 (08/21 0524) SpO2:  [95 %-98 %] 97 % (08/21 0725) Weight:  [89.4 kg (197 lb 1.8 oz)] 89.4 kg (197 lb 1.8 oz) (08/21 0524) Weight change:   Intake/Output from previous day: 08/20 0701 - 08/21 0700 In: 340 [P.O.:240; IV Piggyback:100] Out: 200 [Urine:200] Intake/Output this shift: No intake/output data recorded.  Generally patient is very somulent but arousable and in no apparent distress Chest: Decreased breath sound bilaterally Heart exam regular rate and rhythm no murmur Extremities she has 2+ edema  Lab Results:  Recent Labs  05/12/17 0546 05/14/17 0749  WBC 10.3 14.8*  HGB 7.8* 7.3*  HCT 23.4* 21.8*  PLT 205 157   BMET:   Recent Labs  05/12/17 0546 05/13/17 0541  NA 125* 125*  K 3.8 4.2  CL 87* 87*  CO2 27 26  GLUCOSE 81 91  BUN 19 19  CREATININE 3.47* 3.92*  CALCIUM 7.7* 7.6*   No results for input(s): PTH in the last 72 hours. Iron Studies:   Recent Labs  05/13/17 0541  IRON 70  TIBC NOT CALCULATED  FERRITIN 820*    Studies/Results: Dg Chest Port 1 View  Result Date: 05/13/2017 CLINICAL DATA:  Productive cough, shortness of breath. EXAM: PORTABLE CHEST 1 VIEW COMPARISON:  Radiograph of April 08, 2017. FINDINGS: Stable cardiomegaly with mild central pulmonary vascular congestion. No pneumothorax is noted. Mild bibasilar atelectasis or edema is noted with minimal associated pleural effusions. Bony thorax is unremarkable. IMPRESSION: Stable cardiomegaly with mild central pulmonary vascular congestion. Mild bibasilar atelectasis or edema is noted with minimal associated pleural effusions. Electronically Signed   By: Lupita Raider, M.D.   On: 05/13/2017 19:03    I have reviewed the patient's current medications.  Assessment/Plan: Problem #1 acute kidney injury superimposed on chronic. Presently her BUN and creatinine Continue to increase. Her urine output is also poor.. Etiology was thought to be secondary to prerenal syndrome/ATN/possible contrast induced acute kidney injury. Her renal function continued to decline. Patient presently has poor appetite and some nausea. But she denies any vomiting. At this moment patient is not able to take her medications. Problem #2 chronic renal failure stage III Problem #3 anemia: Her hemoglobin is below target goal. Possibly a combination of iron deficiency anemia and anemia of chronic renal failure. Problem #4 hypertension: Her blood pressure is reasonably controlled Problem #5 history of cellulitis Problem #6 history of COPD Problem #7 bone and mineral disorder: Her calcium and phosphorus is range Problem #8 hyponatremia: Hypervolemic hyponatremia. Her sodium is low but stable.  Problem #9 history of CHF: She is on Lasix. Her urine output is poor and patient has some edema but denies any difficulty breathing. Plan: 1). Patient overall was poor prognosis. Presently her renal function continued decline. Patient is also becoming more symptomatic. I have discussed with the patient and also her brother who was at bed side. They told me they will discuss with Dr. Juanetta Gosling before they make a decision about dialysis which seems to be reasonable. At this moment her blood work is pending from this morning.  2] for now we will continue with present treatment 3] we'll check her renal panel  and CBC in the morning.    LOS: 15 days   Yolanda [name] S 05/14/2017,8:27 AM

## 2017-05-14 NOTE — Clinical Social Work Note (Signed)
Patient Information   Patient Name Yolanda [name], Yolanda [name] ([id number]) Sex Female DOB 1941/03/12 SSN [id number]  Room Bed  A317 A317-01  Patient Demographics   Address [address] [place] [address] Phone [phone] (Home) *Preferred* [phone] (Mobile) E-mail Address [email or web address]  Patient Ethnicity & Race   Ethnic Group Patient Race  Not Hispanic or Latino White or Caucasian  Emergency Contact(s)   Name Relation Home Work Mobile  Price,Louise Sister [phone]  [phone]  Price,Florence Sister [phone]    Ellwood Sayers Sister [phone]  [phone]  Documents on File    Status Date Received Description  Documents for the Patient  EMR Medication Summary Not Received    EMR Problem Summary Not Received    EMR Patient Summary Not Received    [place] HIPAA NOTICE OF PRIVACY - Scanned Not Received    [place] E-Signature HIPAA Notice of Privacy Received 07/01/14   [place] E-Signature HIPAA Notice of Privacy Spanish Not Received    Driver's License Not Received    Insurance Card Received 01/12/14 BLUE MEDICARE  Advance Directives/Living Will/HCPOA/POA Not Received    Financial Application Not Received    Insurance Card Received 11/13/11 RCGD - OLD  [place] HIPAA NOTICE OF PRIVACY - Scanned Received 11/13/11 RCGD  HIM ROI Authorization Not Received  RCGD--11/13/11 note to PCP  HIM ROI Authorization Not Received  RCGD--11/19/11 prog note & 10/12/10 EGD op note & pathology to Dr Margo Common  HIM ROI Authorization Not Received    Insurance Card Not Received    HIM ROI Authorization Not Received  RCGD--01/15/13 prog note to PCP  Release of Information Received 07/23/13 dpr [id number]  Insurance Card Received 01/26/14 BCBSMCR/DMM/RCGD-OLD  AMB Correspondence  01/29/14 04/15 OFFICE NOTE HAWKINS MD, E  HIM ROI Authorization  02/08/14 RCGD--01/26/14 prog note to PCP Juanetta Gosling)  HIM ROI Authorization  02/11/14 RCGD--01/26/14 labs to PCP Juanetta Gosling)  HIM ROI  Authorization  04/07/14 RCGD--03/30/14 labs to PCP (hawkins)  AMB Patient Logs/Info  04/13/14   HIM ROI Authorization  07/23/14 RCGD--06/29/14 labs to PCP Juanetta Gosling)  HIM ROI Authorization  09/21/14 RCGD--09/02/14 labs to PCP [employer])  Insurance Card Received 10/21/14 BLUE MEDICARE HMO  Other Photo ID Not Received    Insurance Card Received 01/27/15 BCBSMCR/RCGD  HIM ROI Authorization  01/27/15 RCGD--01/27/15 prog note to PCP Juanetta Gosling)  HIM ROI Authorization  05/03/15 RCGD--05/03/15 labs to PCP Juanetta Gosling)  HIM ROI Authorization  05/11/15 RCGD--05/03/15 MRI report to PCP Juanetta Gosling)  HIM ROI Authorization  07/25/15 RCGD--07/18/15 labs to PCP Juanetta Gosling)  HIM ROI Authorization  10/27/15 [place]/BCBS of [employer] [place] Risk Adjustment Review   Insurance Card Received 01/06/16 bcbs 2017  [facility] E-Signature HIPAA Notice of Privacy Received 01/06/16   Insurance Card   BCBS/RCGD/HHS  AMB Correspondence  04/09/16 OFFICE NOTE [facility]  Insurance Card  06/29/16   CE Attachment     Insurance Card Received 07/12/16 BCBS Medicare/ROSM  HIM ROI Authorization  07/17/16 RCGD--07/12/16 labs to PCP (hawkins)  AMB Correspondence  04/30/16 CLINICAL NOTE HAWKINS MD,E  AMB Correspondence  05/08/16 CLINICAL NOTE HAWKINS MD,E  AMB Correspondence  08/08/16 POC PT AND HAND  AMB Correspondence  09/14/16 END OF [facility]ARE [facility]  AMB Outside Hospital Record  01/07/17 [facility]P [facility]  AMB Outside Consult Note  04/02/17 REHMAN, N  AMB Correspondence (Deleted) 08/08/16 POC MALLOY PT, B  AMB Correspondence (Deleted) 09/14/16 END OF CARE NOTE KEELING MD, J  Documents for the Encounter  AOB (Assignment  of Insurance Benefits) Not Received    E-signature AOB Signed 04/29/17   MEDICARE RIGHTS Not Received    E-signature Medicare Rights Signed 04/29/17   ED Patient Billing Extract   ED PB Summary  Correspondence  05/03/17   Physician Order  05/03/17   Admission Information    Attending Provider Admitting Provider Admission Type Admission Date/Time  Kari Baars, MD Haydee Monica, MD Emergency 04/29/17 1734  Discharge Date Hospital Service Auth/Cert Status Service Area   Family Medicine Incomplete [place] SERVICE AREA  Unit Room/Bed Admission Status   AP-DEPT 300 A317/A317-01 Admission (Confirmed)   Admission   Complaint  swollen place on side  Hospital Account   Name Acct ID Class Status Primary Coverage  [place], Or[place] [id number] Inpatient Open BLUE CROSS BLUE SHIELD MEDICARE - BCBS MEDICARE      Guarantor Account (for Hospital Account [id number])   Name Relation to Pt Service Area Active? Acct Type  Ashley Murrain Self CHSA Yes Personal/Family  Address Phone    [address] [place], [place] [address] [phone](H)        Coverage Information (for Hospital Account [id number])   F/O Payor/Plan Precert #  [facility] SHIELD MEDICARE/BCBS MEDICARE   Subscriber Subscriber #  Keba, [place]ca [id number]  Address Phone  [address] [address] [place], [place] [address] [phone]

## 2017-05-14 NOTE — Progress Notes (Signed)
Very difficult to obtain arterial blood gas, was able to get venous. Ran as a venous gas, results put in with no panics. RT will continue to monitor.

## 2017-05-16 LAB — OVA + PARASITE EXAM

## 2017-05-16 LAB — O&P RESULT

## 2017-05-21 ENCOUNTER — Encounter (INDEPENDENT_AMBULATORY_CARE_PROVIDER_SITE_OTHER): Payer: Self-pay | Admitting: Internal Medicine

## 2017-05-25 DEATH — deceased

## 2017-06-18 ENCOUNTER — Ambulatory Visit (INDEPENDENT_AMBULATORY_CARE_PROVIDER_SITE_OTHER): Payer: Medicare Other | Admitting: Internal Medicine

## 2018-01-03 NOTE — Telephone Encounter (Signed)
This encounter was created in error - please disregard.

## 2018-09-30 IMAGING — CT CT HEAD W/O CM
3 series · 16 of 47 positions shown, 19 images · non-contrast
Comparison: Head CT 12/31/2016

CLINICAL DATA: Weakness.  Nausea, loose stools and diarrhea.

EXAM:
CT HEAD WITHOUT CONTRAST
TECHNIQUE: Contiguous axial images were obtained from the base of the skull
through the vertex without intravenous contrast.

[Series 2: head wo · axial · 0.43mm/px · z∈[+1593,+1718]mm · 10 of 31 slices shown, 13 images]
[im 3/31  brain]
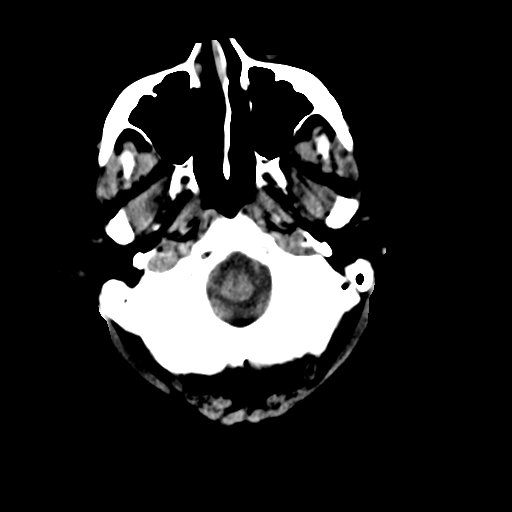
[im 3/31  bone]
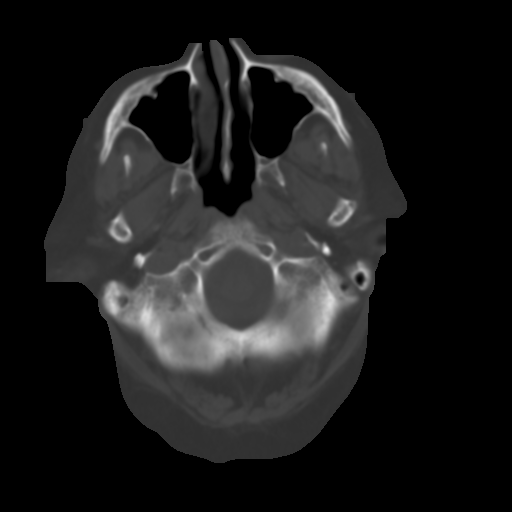
[im 6/31  brain]
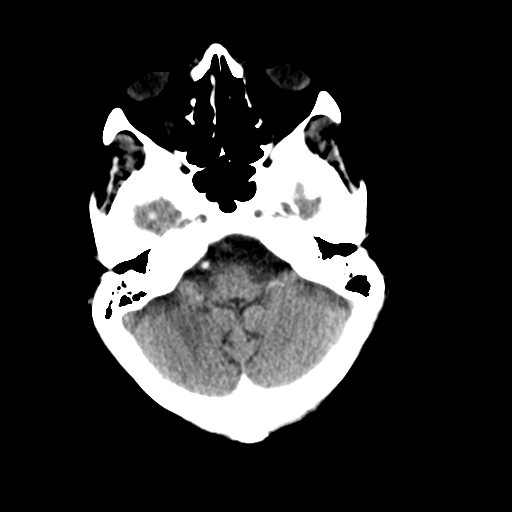
[im 9/31  brain]
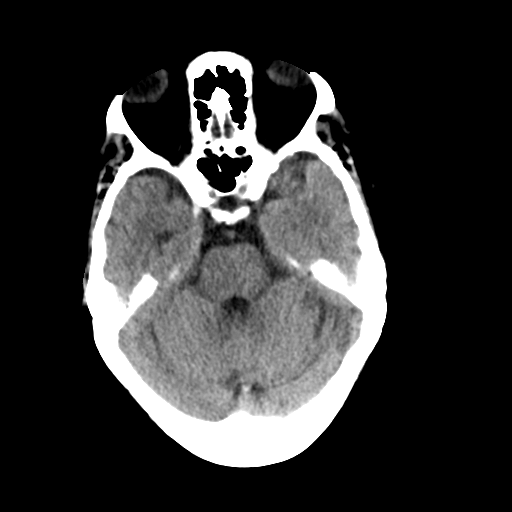
[im 11/31  brain]
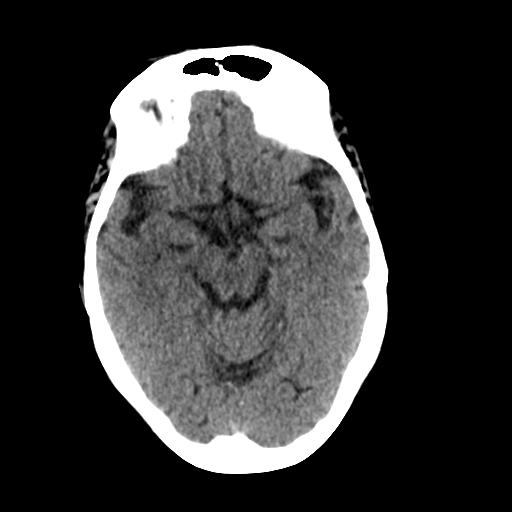
[im 14/31  brain]
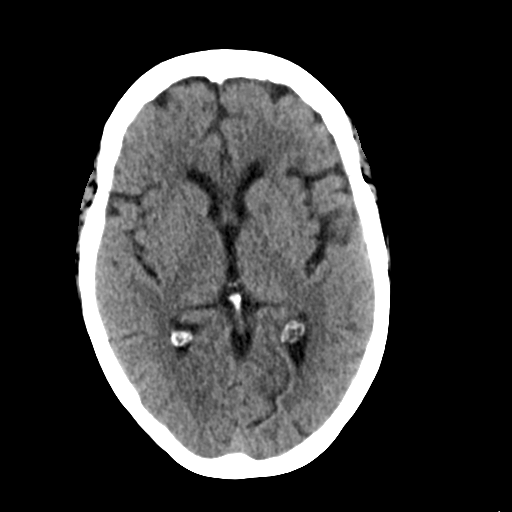
[im 14/31  bone]
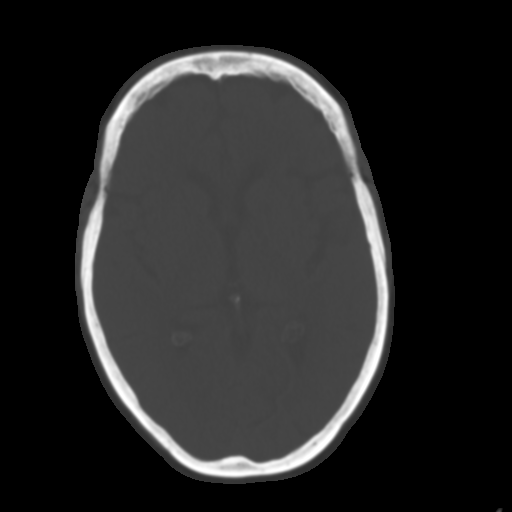
[im 17/31  brain]
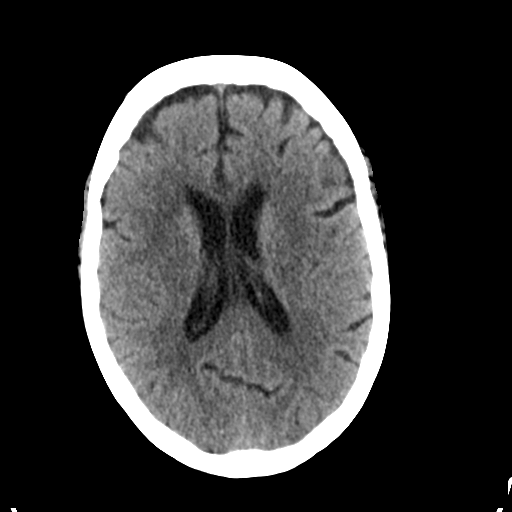
[im 20/31  brain]
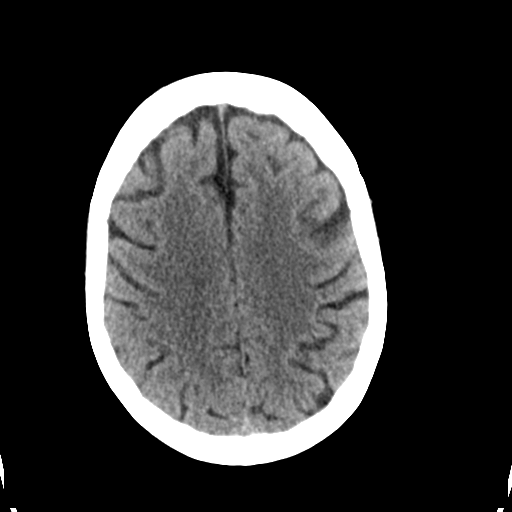
[im 23/31  brain]
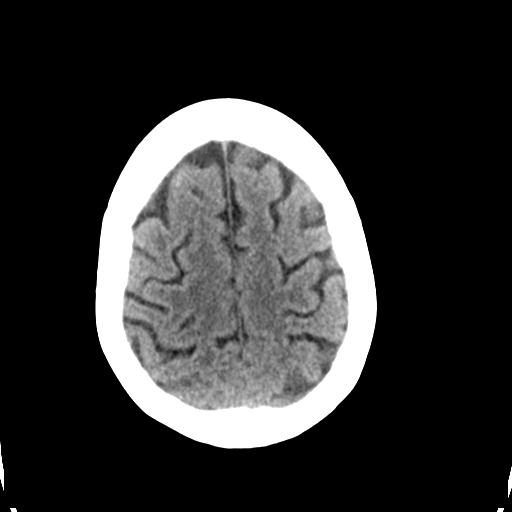
[im 25/31  brain]
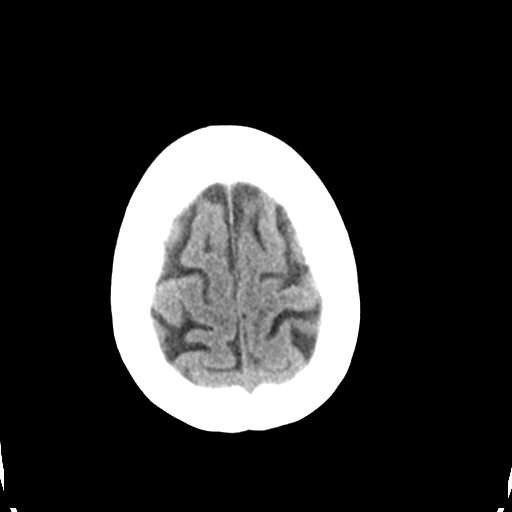
[im 25/31  bone]
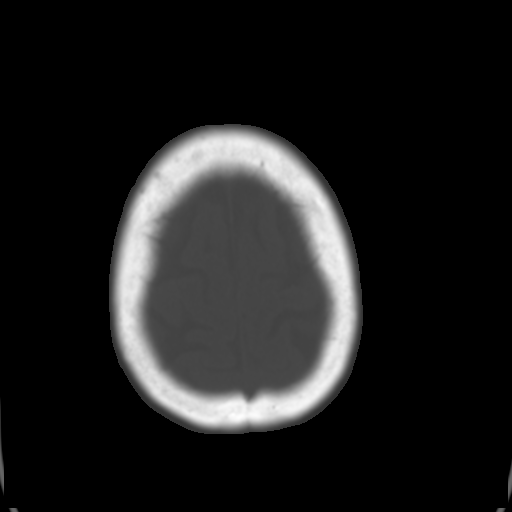
[im 28/31  brain]
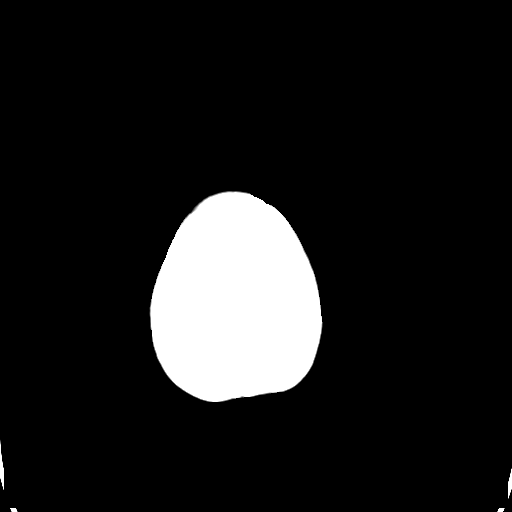

[Series 4: coronal soft tissue · coronal · 0.29mm/px · 3 of 67 slices shown]
[im 23/67  brain]
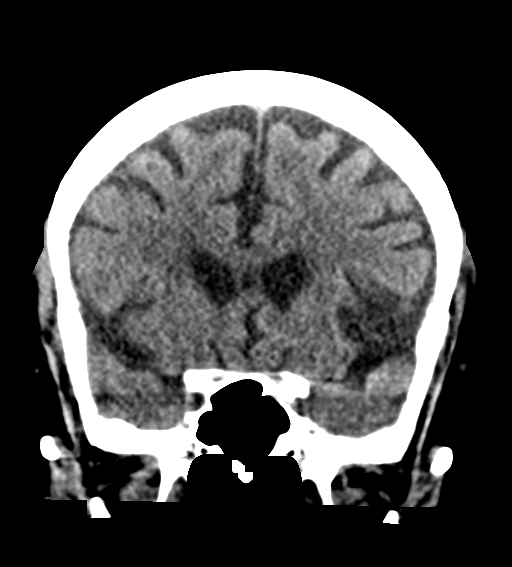
[im 30/67  brain]
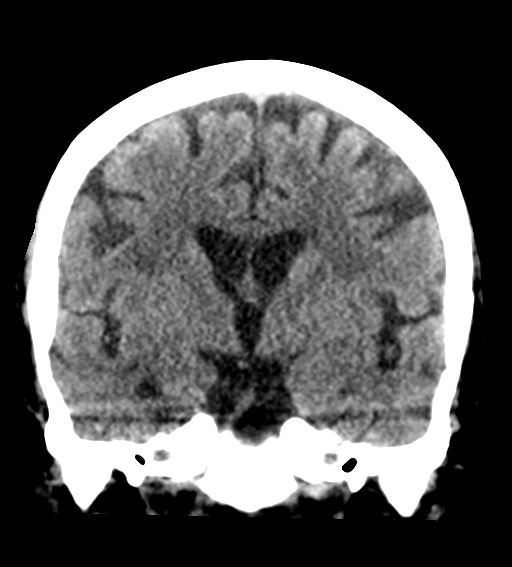
[im 37/67  brain]
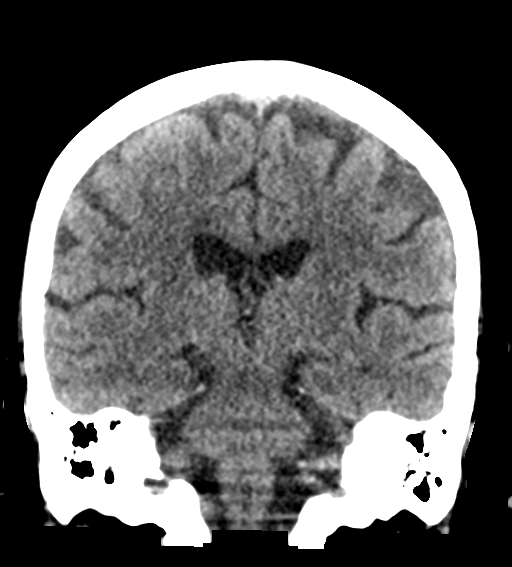

[Series 5: sagittal soft tissue · sagittal · 0.32mm/px · 3 of 52 slices shown]
[im 18/52  brain]
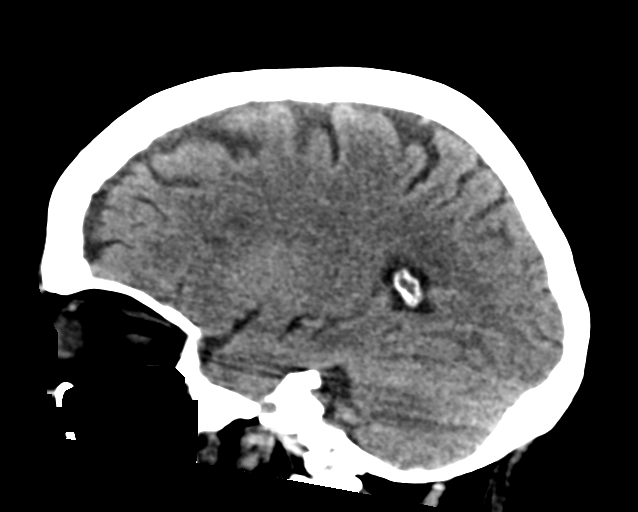
[im 26/52  brain]
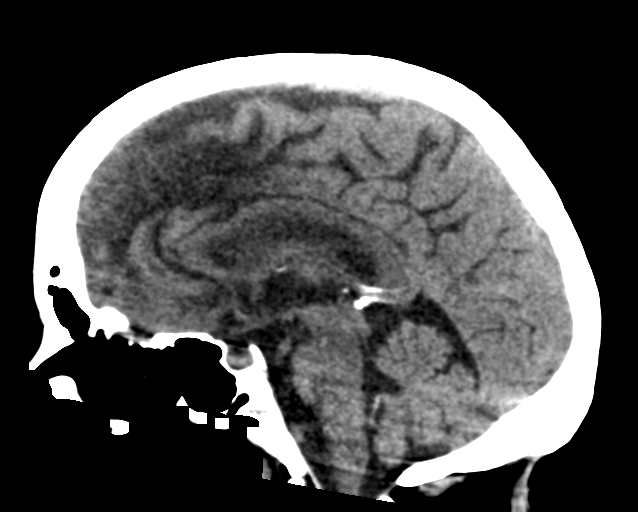
[im 35/52  brain]
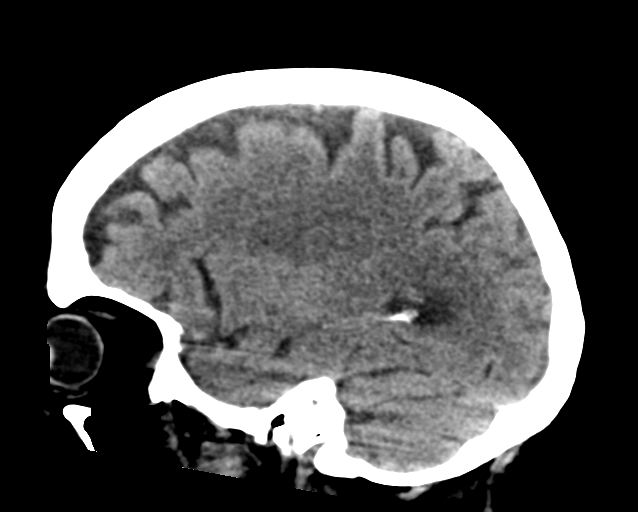

[16 of 47 positions shown; findings below may reference images not displayed]

FINDINGS: Brain: No evidence of acute infarction, hemorrhage, hydrocephalus,
extra-axial collection or mass lesion/mass effect. Mild chronic
small vessel ischemia is stable from prior exam.

Vascular: Atherosclerosis of skullbase vasculature without
hyperdense vessel or abnormal calcification.

Skull: Normal. Negative for fracture or focal lesion.

Sinuses/Orbits: Paranasal sinuses and mastoid air cells are clear.
The visualized orbits are unchanged with mild bilateral
exophthalmos.

Other: None.
IMPRESSION: No acute intracranial abnormality.

## 2018-09-30 IMAGING — DX DG CHEST 1V
1 series · 1 of 1 positions shown · non-contrast
Comparison: Chest radiograph performed 12/30/2016

CLINICAL DATA: Acute onset of shortness of breath. Initial
encounter.

EXAM:
CHEST 1 VIEW

[chest ap]
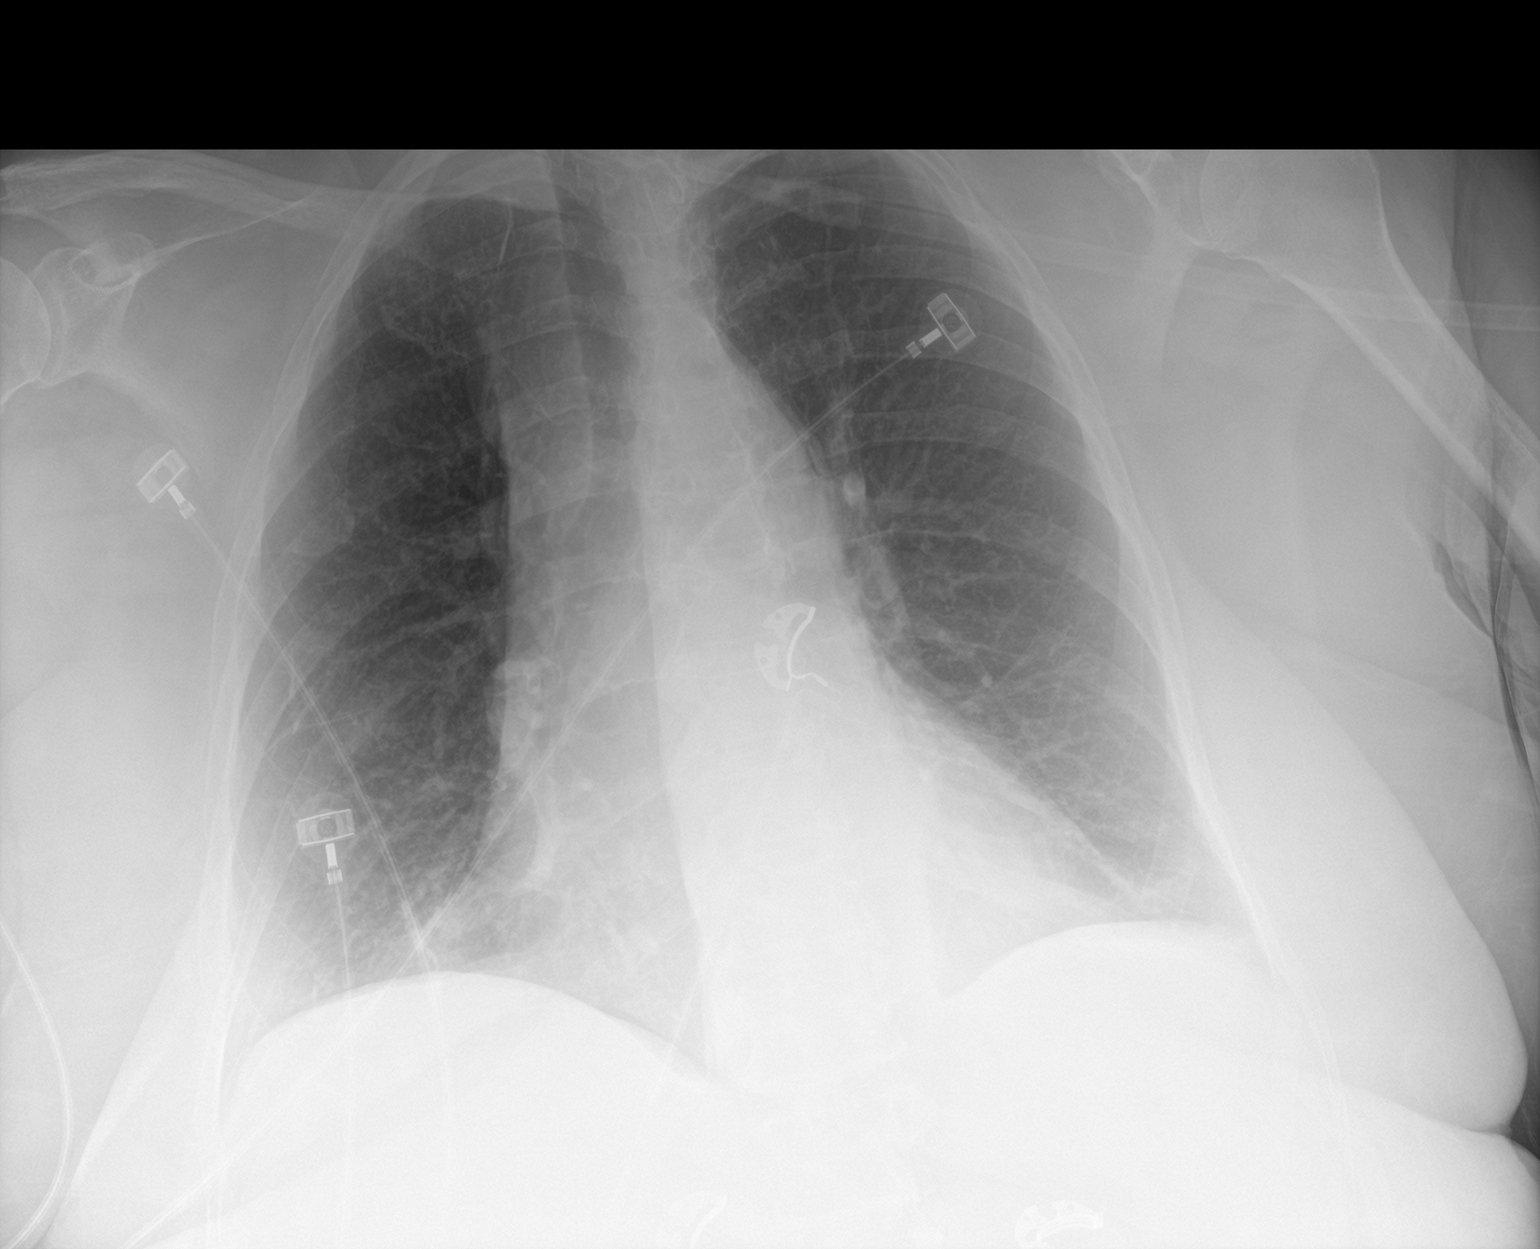

[1 of 1 positions shown; findings below may reference images not displayed]

FINDINGS: The lungs are well-aerated. Minimal bibasilar atelectasis is noted.
There is no evidence of pleural effusion or pneumothorax.

The cardiomediastinal silhouette is mildly enlarged. No acute
osseous abnormalities are seen.
IMPRESSION: Minimal bibasilar atelectasis noted.  Lungs otherwise clear.

## 2018-09-30 IMAGING — CT CT ABD-PELV W/ CM
2 of 5 series · 16 of 46 positions shown, 18 images · IV contrast (Isovue)
Comparison: 02/02/2015

CLINICAL DATA: Shortness of breath, weakness, nausea and diarrhea

EXAM:
CT ABDOMEN AND PELVIS WITH CONTRAST
TECHNIQUE: Multidetector CT imaging of the abdomen and pelvis was performed
using the standard protocol following bolus administration of
intravenous contrast.
CONTRAST:  75mL DSF2DG-WMM IOPAMIDOL (DSF2DG-WMM) INJECTION 61%

[Series 2: axial st · axial · 0.79mm/px · z∈[+866,+1281]mm · 13 of 95 slices shown, 15 images]
[im 6/95  soft-tissue]
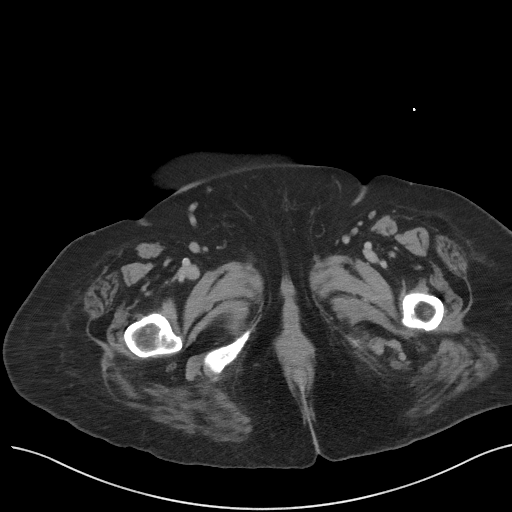
[im 6/95  bone]
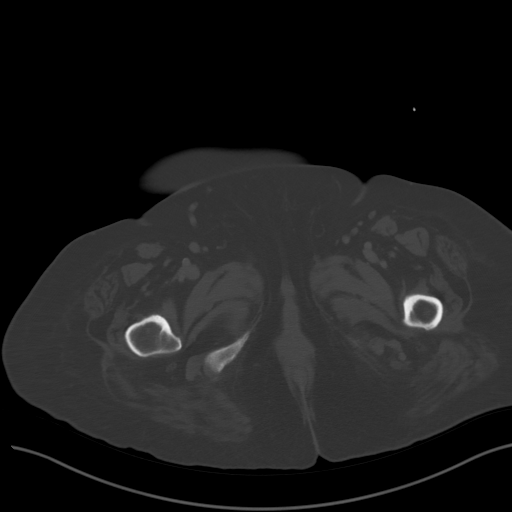
[im 12/95  soft-tissue]
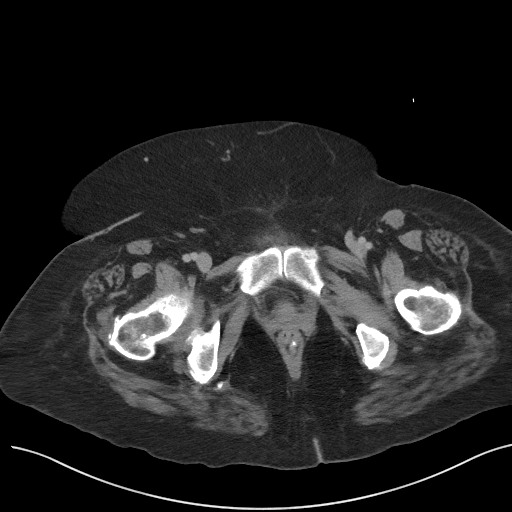
[im 23/95  soft-tissue]
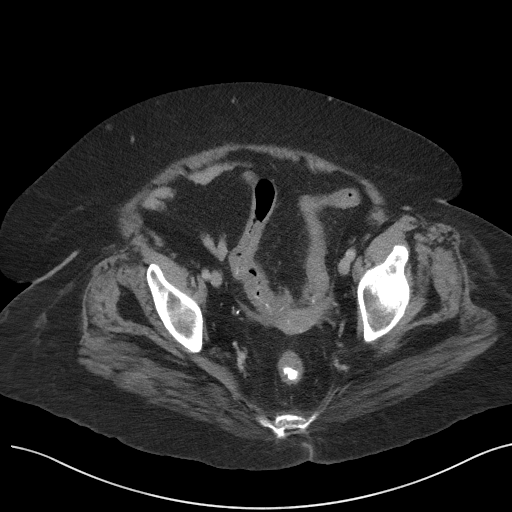
[im 28/95  soft-tissue]
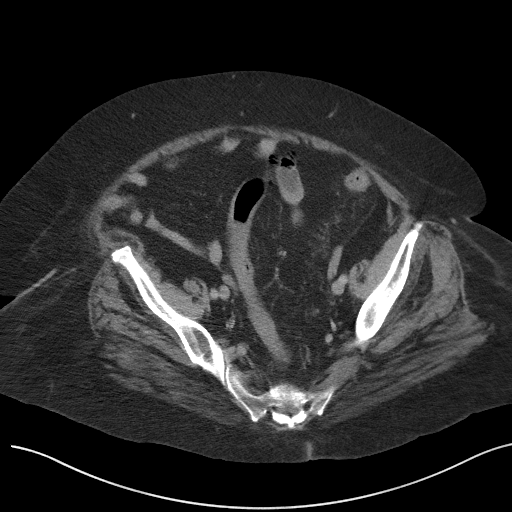
[im 34/95  soft-tissue]
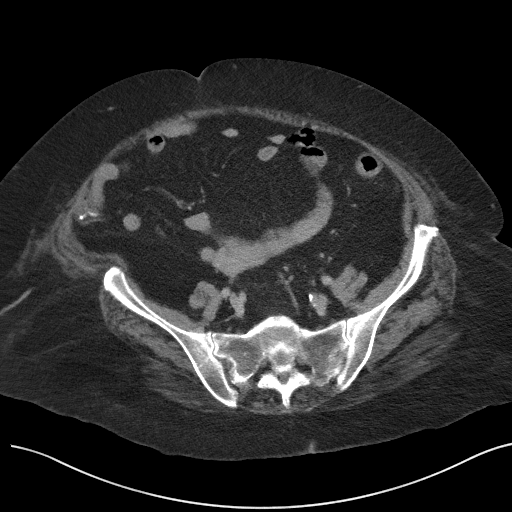
[im 39/95  soft-tissue]
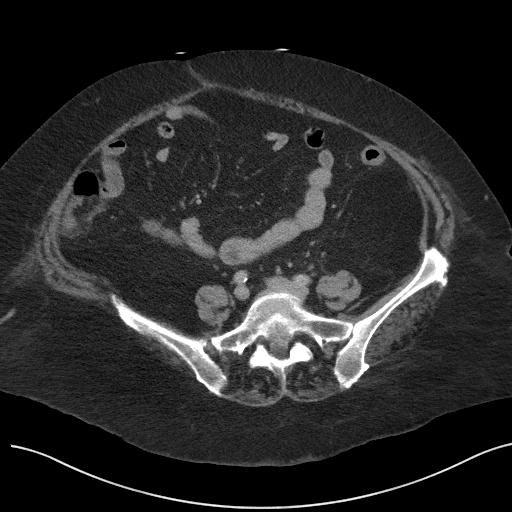
[im 50/95  soft-tissue]
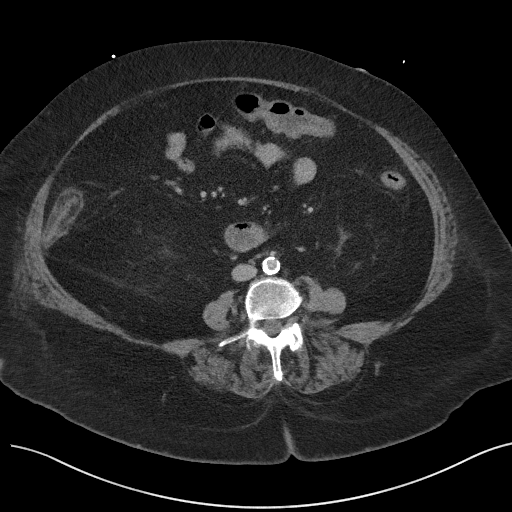
[im 56/95  soft-tissue]
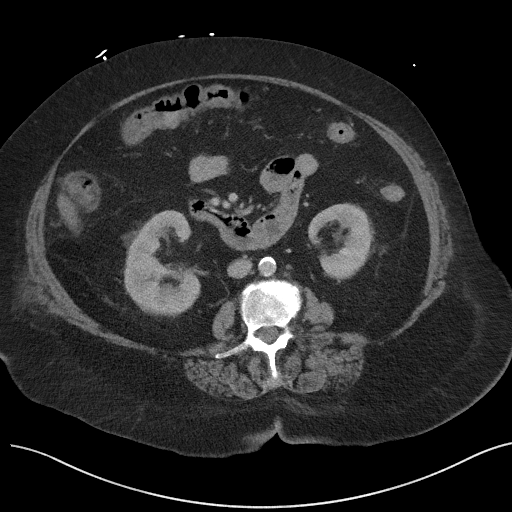
[im 61/95  soft-tissue]
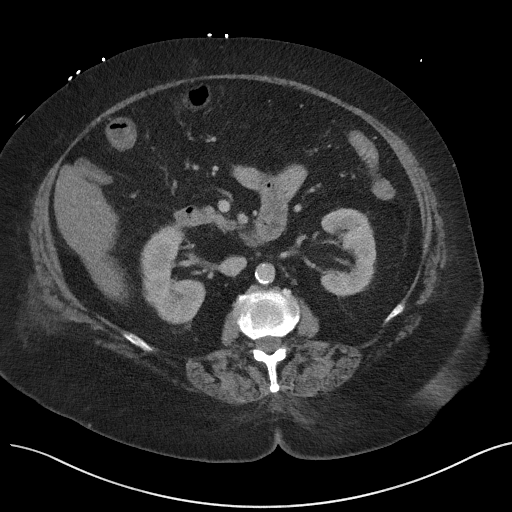
[im 61/95  bone]
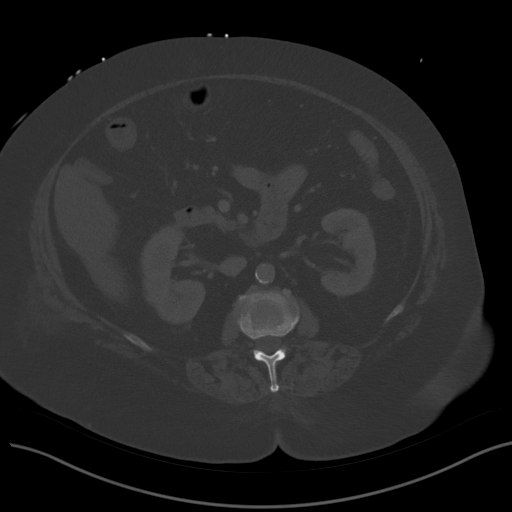
[im 67/95  soft-tissue]
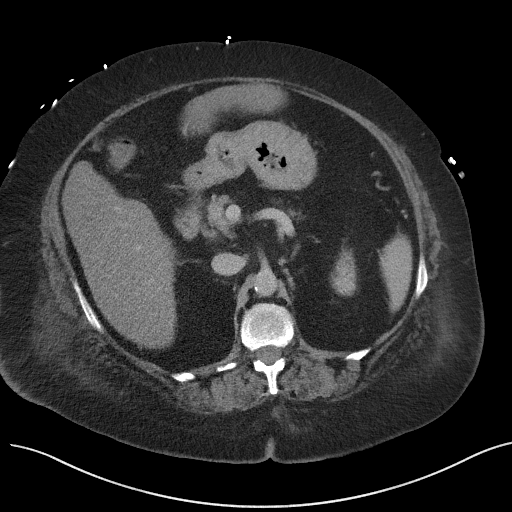
[im 72/95  soft-tissue]
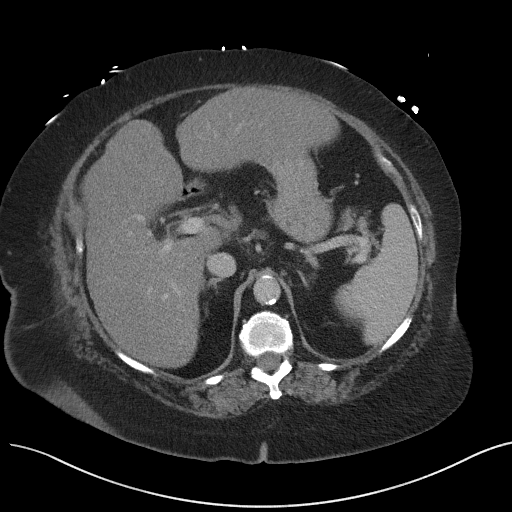
[im 83/95  soft-tissue]
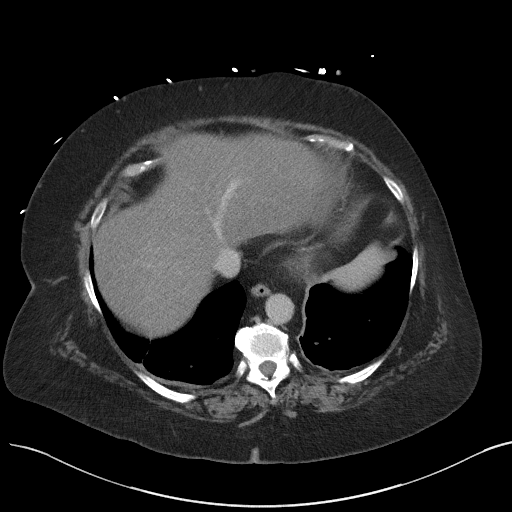
[im 89/95  soft-tissue]
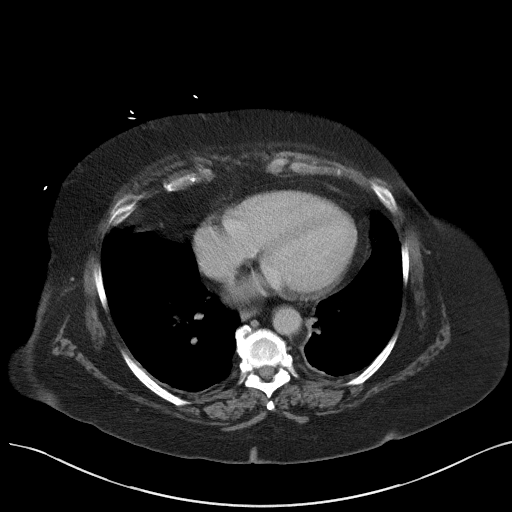

[Series 4: coronal st · coronal · 0.83mm/px · 3 of 116 slices shown]
[im 39/116  soft-tissue]
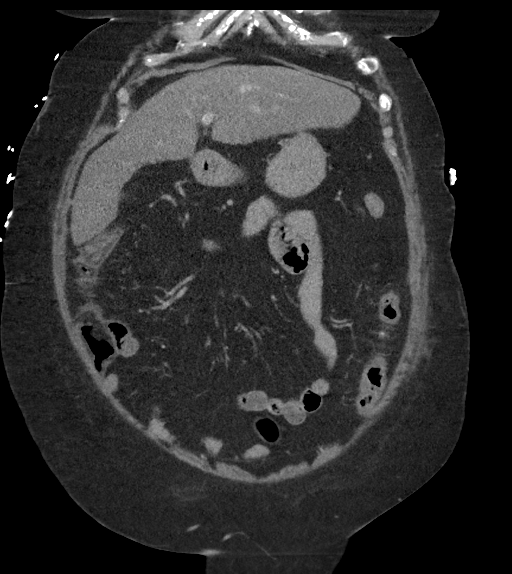
[im 52/116  soft-tissue]
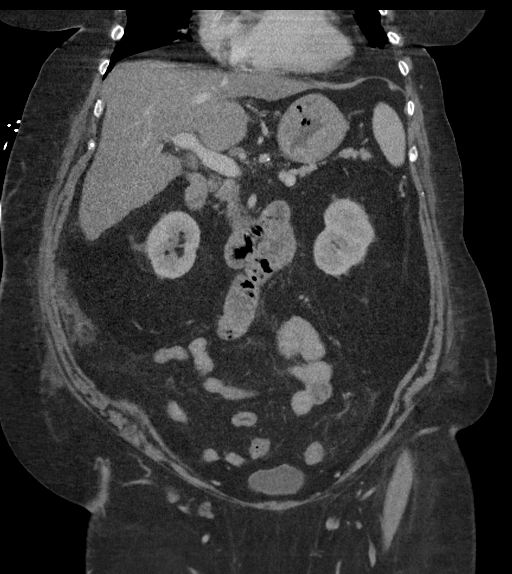
[im 64/116  soft-tissue]
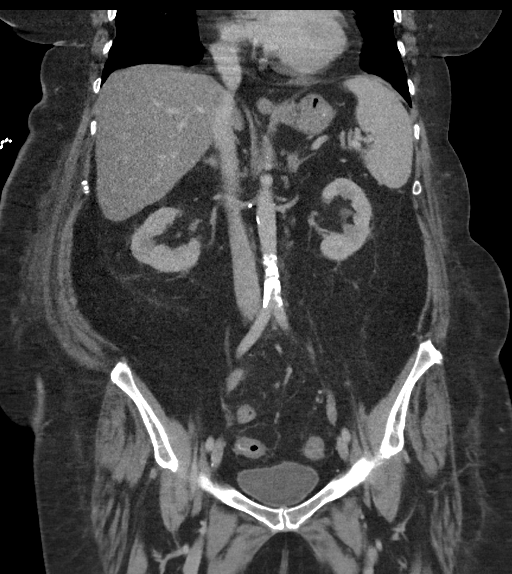

[16 of 46 positions shown; findings below may reference images not displayed]

FINDINGS: Lower chest: Mild cardiomegaly. No pericardial or pleural effusion.
Bibasilar atelectasis. Atherosclerosis of the descending thoracic
aorta.

Hepatobiliary: Mild hypoattenuation of the liver parenchyma
compatible with hepatic steatosis or fatty infiltration. No focal
hepatic abnormality or biliary dilatation. Patent hepatic and portal
veins. Remote cholecystectomy noted.

Pancreas: Unremarkable. No pancreatic ductal dilatation or
surrounding inflammatory changes.

Spleen: Normal in size without focal abnormality.

Adrenals/Urinary Tract: Adrenal glands are unremarkable. Kidneys are
normal, without renal calculi, focal lesion, or hydronephrosis.
Bladder is unremarkable.

Stomach/Bowel: Negative for bowel obstruction, significant
dilatation, ileus, or free air. No fluid collection or abscess.
Remote appendectomy noted. Colon is collapsed. Minor scattered
diverticulosis.

Vascular/Lymphatic: Aortic atherosclerosis noted without aneurysm or
dissection. No occlusive process. No retroperitoneal hemorrhage. No
adenopathy.

Reproductive: Uterus and bilateral adnexa are unremarkable.

Other: No abdominal wall hernia or abnormality. No abdominopelvic
ascites.

Musculoskeletal: Diffuse lumbar spondylosis and facet arthropathy.
No acute osseous finding or compression fracture.
IMPRESSION: No acute intra-abdominal or pelvic finding by CT.

Remote cholecystectomy and appendectomy.

Hepatic steatosis

Abdominal atherosclerosis

Diverticulosis.
